# Patient Record
Sex: Female | Born: 1950 | Race: White | Hispanic: No | Smoking: Former smoker
Health system: Southern US, Community
[De-identification: ages and names within clinical notes are randomized; demographics above are authoritative.]

## PROBLEM LIST (undated history)

## (undated) DIAGNOSIS — G459 Transient cerebral ischemic attack, unspecified: Secondary | ICD-10-CM

## (undated) DIAGNOSIS — G4700 Insomnia, unspecified: Secondary | ICD-10-CM

## (undated) DIAGNOSIS — F32A Depression, unspecified: Secondary | ICD-10-CM

## (undated) DIAGNOSIS — K279 Peptic ulcer, site unspecified, unspecified as acute or chronic, without hemorrhage or perforation: Secondary | ICD-10-CM

## (undated) DIAGNOSIS — I10 Essential (primary) hypertension: Secondary | ICD-10-CM

## (undated) DIAGNOSIS — J9622 Acute and chronic respiratory failure with hypercapnia: Secondary | ICD-10-CM

## (undated) DIAGNOSIS — J449 Chronic obstructive pulmonary disease, unspecified: Secondary | ICD-10-CM

## (undated) DIAGNOSIS — I5031 Acute diastolic (congestive) heart failure: Secondary | ICD-10-CM

## (undated) DIAGNOSIS — K5790 Diverticulosis of intestine, part unspecified, without perforation or abscess without bleeding: Secondary | ICD-10-CM

## (undated) DIAGNOSIS — Z90710 Acquired absence of both cervix and uterus: Secondary | ICD-10-CM

## (undated) DIAGNOSIS — Z720 Tobacco use: Secondary | ICD-10-CM

## (undated) DIAGNOSIS — I2510 Atherosclerotic heart disease of native coronary artery without angina pectoris: Secondary | ICD-10-CM

## (undated) DIAGNOSIS — J329 Chronic sinusitis, unspecified: Secondary | ICD-10-CM

## (undated) DIAGNOSIS — R42 Dizziness and giddiness: Secondary | ICD-10-CM

## (undated) DIAGNOSIS — I889 Nonspecific lymphadenitis, unspecified: Secondary | ICD-10-CM

## (undated) DIAGNOSIS — IMO0002 Reserved for concepts with insufficient information to code with codable children: Secondary | ICD-10-CM

## (undated) DIAGNOSIS — M549 Dorsalgia, unspecified: Secondary | ICD-10-CM

## (undated) DIAGNOSIS — K589 Irritable bowel syndrome without diarrhea: Secondary | ICD-10-CM

## (undated) DIAGNOSIS — I739 Peripheral vascular disease, unspecified: Secondary | ICD-10-CM

## (undated) DIAGNOSIS — R9389 Abnormal findings on diagnostic imaging of other specified body structures: Secondary | ICD-10-CM

## (undated) DIAGNOSIS — F329 Major depressive disorder, single episode, unspecified: Secondary | ICD-10-CM

## (undated) DIAGNOSIS — R230 Cyanosis: Secondary | ICD-10-CM

## (undated) DIAGNOSIS — R197 Diarrhea, unspecified: Secondary | ICD-10-CM

## (undated) DIAGNOSIS — K219 Gastro-esophageal reflux disease without esophagitis: Secondary | ICD-10-CM

## (undated) DIAGNOSIS — F1010 Alcohol abuse, uncomplicated: Secondary | ICD-10-CM

## (undated) DIAGNOSIS — J029 Acute pharyngitis, unspecified: Secondary | ICD-10-CM

## (undated) HISTORY — DX: Depression, unspecified: F32.A

## (undated) HISTORY — DX: Acute and chronic respiratory failure with hypercapnia: J96.22

## (undated) HISTORY — DX: Abnormal findings on diagnostic imaging of other specified body structures: R93.89

## (undated) HISTORY — DX: Tobacco use: Z72.0

## (undated) HISTORY — DX: Acute pharyngitis, unspecified: J02.9

## (undated) HISTORY — DX: Dorsalgia, unspecified: M54.9

## (undated) HISTORY — DX: Chronic obstructive pulmonary disease, unspecified: J44.9

## (undated) HISTORY — DX: Irritable bowel syndrome, unspecified: K58.9

## (undated) HISTORY — DX: Reserved for concepts with insufficient information to code with codable children: IMO0002

## (undated) HISTORY — DX: Dizziness and giddiness: R42

## (undated) HISTORY — DX: Gastro-esophageal reflux disease without esophagitis: K21.9

## (undated) HISTORY — PX: CARDIAC CATHETERIZATION: SHX172

## (undated) HISTORY — DX: Alcohol abuse, uncomplicated: F10.10

## (undated) HISTORY — PX: TUBAL LIGATION: SHX77

## (undated) HISTORY — DX: Diverticulosis of intestine, part unspecified, without perforation or abscess without bleeding: K57.90

## (undated) HISTORY — DX: Peripheral vascular disease, unspecified: I73.9

## (undated) HISTORY — DX: Major depressive disorder, single episode, unspecified: F32.9

## (undated) HISTORY — PX: CORONARY STENT PLACEMENT: SHX1402

## (undated) HISTORY — PX: ABDOMINAL HYSTERECTOMY: SHX81

## (undated) HISTORY — PX: OTHER SURGICAL HISTORY: SHX169

## (undated) HISTORY — DX: Acquired absence of both cervix and uterus: Z90.710

## (undated) HISTORY — DX: Insomnia, unspecified: G47.00

## (undated) HISTORY — DX: Chronic sinusitis, unspecified: J32.9

## (undated) HISTORY — DX: Cyanosis: R23.0

## (undated) HISTORY — DX: Essential (primary) hypertension: I10

## (undated) HISTORY — PX: CHOLECYSTECTOMY: SHX55

## (undated) HISTORY — PX: APPENDECTOMY: SHX54

## (undated) HISTORY — DX: Acute diastolic (congestive) heart failure: I50.31

## (undated) HISTORY — DX: Nonspecific lymphadenitis, unspecified: I88.9

## (undated) HISTORY — DX: Atherosclerotic heart disease of native coronary artery without angina pectoris: I25.10

## (undated) HISTORY — DX: Diarrhea, unspecified: R19.7

## (undated) HISTORY — DX: Transient cerebral ischemic attack, unspecified: G45.9

## (undated) HISTORY — DX: Peptic ulcer, site unspecified, unspecified as acute or chronic, without hemorrhage or perforation: K27.9

---

## 2003-09-27 ENCOUNTER — Inpatient Hospital Stay (HOSPITAL_COMMUNITY): Admission: EM | Admit: 2003-09-27 | Discharge: 2003-10-02 | Payer: Self-pay

## 2003-09-30 ENCOUNTER — Encounter (INDEPENDENT_AMBULATORY_CARE_PROVIDER_SITE_OTHER): Payer: Self-pay | Admitting: Cardiology

## 2003-10-01 ENCOUNTER — Encounter (INDEPENDENT_AMBULATORY_CARE_PROVIDER_SITE_OTHER): Payer: Self-pay | Admitting: Cardiology

## 2003-10-09 ENCOUNTER — Inpatient Hospital Stay (HOSPITAL_COMMUNITY): Admission: EM | Admit: 2003-10-09 | Discharge: 2003-10-10 | Payer: Self-pay | Admitting: Emergency Medicine

## 2003-10-15 ENCOUNTER — Emergency Department (HOSPITAL_COMMUNITY): Admission: EM | Admit: 2003-10-15 | Discharge: 2003-10-16 | Payer: Self-pay | Admitting: Emergency Medicine

## 2003-12-22 ENCOUNTER — Ambulatory Visit: Payer: Self-pay | Admitting: Internal Medicine

## 2003-12-31 ENCOUNTER — Ambulatory Visit: Payer: Self-pay | Admitting: *Deleted

## 2004-01-01 ENCOUNTER — Ambulatory Visit (HOSPITAL_COMMUNITY): Admission: RE | Admit: 2004-01-01 | Discharge: 2004-01-01 | Payer: Self-pay | Admitting: Internal Medicine

## 2004-01-01 ENCOUNTER — Ambulatory Visit: Payer: Self-pay | Admitting: Internal Medicine

## 2004-01-12 ENCOUNTER — Ambulatory Visit (HOSPITAL_COMMUNITY): Admission: RE | Admit: 2004-01-12 | Discharge: 2004-01-12 | Payer: Self-pay | Admitting: Internal Medicine

## 2004-01-14 ENCOUNTER — Ambulatory Visit: Payer: Self-pay | Admitting: Internal Medicine

## 2004-03-11 ENCOUNTER — Emergency Department (HOSPITAL_COMMUNITY): Admission: EM | Admit: 2004-03-11 | Discharge: 2004-03-11 | Payer: Self-pay | Admitting: Emergency Medicine

## 2004-03-17 ENCOUNTER — Ambulatory Visit: Payer: Self-pay | Admitting: Internal Medicine

## 2004-03-22 ENCOUNTER — Ambulatory Visit: Payer: Self-pay | Admitting: Internal Medicine

## 2004-03-23 ENCOUNTER — Ambulatory Visit (HOSPITAL_COMMUNITY): Admission: RE | Admit: 2004-03-23 | Discharge: 2004-03-23 | Payer: Self-pay | Admitting: Internal Medicine

## 2004-03-25 ENCOUNTER — Ambulatory Visit: Payer: Self-pay | Admitting: Internal Medicine

## 2004-03-31 ENCOUNTER — Inpatient Hospital Stay (HOSPITAL_COMMUNITY): Admission: EM | Admit: 2004-03-31 | Discharge: 2004-04-02 | Payer: Self-pay | Admitting: Emergency Medicine

## 2004-04-08 ENCOUNTER — Ambulatory Visit: Payer: Self-pay | Admitting: Internal Medicine

## 2004-04-10 ENCOUNTER — Ambulatory Visit (HOSPITAL_COMMUNITY): Admission: RE | Admit: 2004-04-10 | Discharge: 2004-04-10 | Payer: Self-pay | Admitting: Internal Medicine

## 2004-04-16 ENCOUNTER — Ambulatory Visit: Payer: Self-pay | Admitting: Internal Medicine

## 2004-05-14 ENCOUNTER — Emergency Department (HOSPITAL_COMMUNITY): Admission: EM | Admit: 2004-05-14 | Discharge: 2004-05-14 | Payer: Self-pay | Admitting: Emergency Medicine

## 2004-06-29 ENCOUNTER — Ambulatory Visit: Payer: Self-pay | Admitting: Internal Medicine

## 2004-08-11 ENCOUNTER — Ambulatory Visit: Payer: Self-pay | Admitting: Internal Medicine

## 2004-10-16 ENCOUNTER — Other Ambulatory Visit: Payer: Self-pay

## 2004-10-17 ENCOUNTER — Other Ambulatory Visit: Payer: Self-pay

## 2004-10-17 ENCOUNTER — Inpatient Hospital Stay: Payer: Self-pay | Admitting: Internal Medicine

## 2004-12-24 ENCOUNTER — Ambulatory Visit: Payer: Self-pay | Admitting: Internal Medicine

## 2005-02-08 ENCOUNTER — Emergency Department: Payer: Self-pay | Admitting: Unknown Physician Specialty

## 2005-04-29 ENCOUNTER — Ambulatory Visit: Payer: Self-pay | Admitting: Internal Medicine

## 2005-05-02 ENCOUNTER — Ambulatory Visit (HOSPITAL_COMMUNITY): Admission: RE | Admit: 2005-05-02 | Discharge: 2005-05-02 | Payer: Self-pay | Admitting: Internal Medicine

## 2005-05-12 ENCOUNTER — Ambulatory Visit: Payer: Self-pay | Admitting: Internal Medicine

## 2005-05-23 ENCOUNTER — Encounter (INDEPENDENT_AMBULATORY_CARE_PROVIDER_SITE_OTHER): Payer: Self-pay | Admitting: Internal Medicine

## 2005-05-23 ENCOUNTER — Ambulatory Visit: Payer: Self-pay | Admitting: Internal Medicine

## 2005-05-23 LAB — CONVERTED CEMR LAB
Cholesterol: 145 mg/dL
HDL: 32 mg/dL
Triglycerides: 206 mg/dL

## 2005-05-30 ENCOUNTER — Ambulatory Visit: Payer: Self-pay | Admitting: Internal Medicine

## 2005-06-07 ENCOUNTER — Encounter: Admission: RE | Admit: 2005-06-07 | Discharge: 2005-06-07 | Payer: Self-pay | Admitting: Internal Medicine

## 2005-06-23 ENCOUNTER — Encounter: Admission: RE | Admit: 2005-06-23 | Discharge: 2005-06-23 | Payer: Self-pay | Admitting: Internal Medicine

## 2005-09-22 ENCOUNTER — Ambulatory Visit (HOSPITAL_COMMUNITY): Admission: RE | Admit: 2005-09-22 | Discharge: 2005-09-22 | Payer: Self-pay | Admitting: Cardiology

## 2005-10-06 ENCOUNTER — Ambulatory Visit: Payer: Self-pay | Admitting: Family Medicine

## 2005-10-06 ENCOUNTER — Encounter (INDEPENDENT_AMBULATORY_CARE_PROVIDER_SITE_OTHER): Payer: Self-pay | Admitting: Internal Medicine

## 2005-10-06 LAB — CONVERTED CEMR LAB: Hemoglobin: 13.3 g/dL

## 2005-12-12 ENCOUNTER — Ambulatory Visit: Payer: Self-pay | Admitting: Internal Medicine

## 2005-12-21 ENCOUNTER — Ambulatory Visit (HOSPITAL_COMMUNITY): Admission: RE | Admit: 2005-12-21 | Discharge: 2005-12-21 | Payer: Self-pay | Admitting: Gastroenterology

## 2005-12-28 ENCOUNTER — Ambulatory Visit: Payer: Self-pay | Admitting: Family Medicine

## 2006-02-14 ENCOUNTER — Ambulatory Visit: Payer: Self-pay | Admitting: Internal Medicine

## 2006-02-15 ENCOUNTER — Ambulatory Visit (HOSPITAL_COMMUNITY): Admission: RE | Admit: 2006-02-15 | Discharge: 2006-02-15 | Payer: Self-pay | Admitting: Internal Medicine

## 2006-02-18 DIAGNOSIS — R197 Diarrhea, unspecified: Secondary | ICD-10-CM | POA: Insufficient documentation

## 2006-02-27 ENCOUNTER — Encounter: Admission: RE | Admit: 2006-02-27 | Discharge: 2006-05-28 | Payer: Self-pay | Admitting: Internal Medicine

## 2006-02-28 ENCOUNTER — Ambulatory Visit: Payer: Self-pay | Admitting: Internal Medicine

## 2006-04-18 ENCOUNTER — Ambulatory Visit: Payer: Self-pay | Admitting: Internal Medicine

## 2006-05-26 ENCOUNTER — Ambulatory Visit: Payer: Self-pay | Admitting: Internal Medicine

## 2006-05-30 ENCOUNTER — Ambulatory Visit: Payer: Self-pay | Admitting: Internal Medicine

## 2006-06-27 ENCOUNTER — Inpatient Hospital Stay (HOSPITAL_COMMUNITY): Admission: AD | Admit: 2006-06-27 | Discharge: 2006-06-30 | Payer: Self-pay | Admitting: Surgery

## 2006-06-29 ENCOUNTER — Encounter (INDEPENDENT_AMBULATORY_CARE_PROVIDER_SITE_OTHER): Payer: Self-pay | Admitting: Specialist

## 2006-08-24 ENCOUNTER — Ambulatory Visit: Payer: Self-pay | Admitting: Internal Medicine

## 2006-08-24 LAB — CONVERTED CEMR LAB: Creatinine, Ser: 0.61 mg/dL

## 2006-08-28 ENCOUNTER — Ambulatory Visit (HOSPITAL_COMMUNITY): Admission: RE | Admit: 2006-08-28 | Discharge: 2006-08-28 | Payer: Self-pay | Admitting: Internal Medicine

## 2006-09-01 ENCOUNTER — Ambulatory Visit: Payer: Self-pay | Admitting: Internal Medicine

## 2006-09-01 DIAGNOSIS — B351 Tinea unguium: Secondary | ICD-10-CM | POA: Insufficient documentation

## 2006-09-04 ENCOUNTER — Encounter (INDEPENDENT_AMBULATORY_CARE_PROVIDER_SITE_OTHER): Payer: Self-pay | Admitting: Internal Medicine

## 2006-09-04 DIAGNOSIS — R51 Headache: Secondary | ICD-10-CM

## 2006-09-04 DIAGNOSIS — Z9861 Coronary angioplasty status: Secondary | ICD-10-CM

## 2006-09-04 DIAGNOSIS — J449 Chronic obstructive pulmonary disease, unspecified: Secondary | ICD-10-CM

## 2006-09-04 DIAGNOSIS — K279 Peptic ulcer, site unspecified, unspecified as acute or chronic, without hemorrhage or perforation: Secondary | ICD-10-CM

## 2006-09-04 DIAGNOSIS — K589 Irritable bowel syndrome without diarrhea: Secondary | ICD-10-CM | POA: Insufficient documentation

## 2006-09-04 DIAGNOSIS — K649 Unspecified hemorrhoids: Secondary | ICD-10-CM | POA: Insufficient documentation

## 2006-09-04 DIAGNOSIS — I2510 Atherosclerotic heart disease of native coronary artery without angina pectoris: Secondary | ICD-10-CM | POA: Insufficient documentation

## 2006-09-04 DIAGNOSIS — E785 Hyperlipidemia, unspecified: Secondary | ICD-10-CM | POA: Insufficient documentation

## 2006-09-04 DIAGNOSIS — M545 Low back pain, unspecified: Secondary | ICD-10-CM | POA: Insufficient documentation

## 2006-09-04 DIAGNOSIS — F329 Major depressive disorder, single episode, unspecified: Secondary | ICD-10-CM | POA: Insufficient documentation

## 2006-09-04 DIAGNOSIS — K219 Gastro-esophageal reflux disease without esophagitis: Secondary | ICD-10-CM

## 2006-09-04 DIAGNOSIS — I10 Essential (primary) hypertension: Secondary | ICD-10-CM

## 2006-09-04 DIAGNOSIS — F17210 Nicotine dependence, cigarettes, uncomplicated: Secondary | ICD-10-CM

## 2006-09-04 DIAGNOSIS — F1010 Alcohol abuse, uncomplicated: Secondary | ICD-10-CM | POA: Insufficient documentation

## 2006-09-04 DIAGNOSIS — M5030 Other cervical disc degeneration, unspecified cervical region: Secondary | ICD-10-CM

## 2006-09-04 DIAGNOSIS — R519 Headache, unspecified: Secondary | ICD-10-CM | POA: Insufficient documentation

## 2006-09-04 DIAGNOSIS — Z8679 Personal history of other diseases of the circulatory system: Secondary | ICD-10-CM | POA: Insufficient documentation

## 2006-09-04 DIAGNOSIS — K5730 Diverticulosis of large intestine without perforation or abscess without bleeding: Secondary | ICD-10-CM | POA: Insufficient documentation

## 2006-09-04 HISTORY — DX: Alcohol abuse, uncomplicated: F10.10

## 2006-09-04 HISTORY — DX: Peptic ulcer, site unspecified, unspecified as acute or chronic, without hemorrhage or perforation: K27.9

## 2006-09-04 HISTORY — DX: Diverticulosis of large intestine without perforation or abscess without bleeding: K57.30

## 2006-09-04 HISTORY — DX: Coronary angioplasty status: Z98.61

## 2006-09-04 HISTORY — DX: Other cervical disc degeneration, unspecified cervical region: M50.30

## 2006-09-04 HISTORY — DX: Atherosclerotic heart disease of native coronary artery without angina pectoris: I25.10

## 2006-09-04 HISTORY — DX: Irritable bowel syndrome, unspecified: K58.9

## 2006-09-04 HISTORY — DX: Chronic obstructive pulmonary disease, unspecified: J44.9

## 2006-10-19 ENCOUNTER — Ambulatory Visit: Payer: Self-pay | Admitting: Internal Medicine

## 2006-10-24 ENCOUNTER — Ambulatory Visit: Payer: Self-pay | Admitting: Internal Medicine

## 2006-10-25 ENCOUNTER — Ambulatory Visit (HOSPITAL_COMMUNITY): Admission: RE | Admit: 2006-10-25 | Discharge: 2006-10-25 | Payer: Self-pay | Admitting: Internal Medicine

## 2006-11-07 ENCOUNTER — Ambulatory Visit: Payer: Self-pay | Admitting: Internal Medicine

## 2006-11-16 ENCOUNTER — Ambulatory Visit: Payer: Self-pay | Admitting: Surgery

## 2006-11-16 ENCOUNTER — Ambulatory Visit (HOSPITAL_COMMUNITY): Admission: RE | Admit: 2006-11-16 | Discharge: 2006-11-16 | Payer: Self-pay | Admitting: Internal Medicine

## 2006-11-16 ENCOUNTER — Encounter (INDEPENDENT_AMBULATORY_CARE_PROVIDER_SITE_OTHER): Payer: Self-pay | Admitting: Internal Medicine

## 2006-12-01 ENCOUNTER — Telehealth (INDEPENDENT_AMBULATORY_CARE_PROVIDER_SITE_OTHER): Payer: Self-pay | Admitting: Internal Medicine

## 2006-12-01 ENCOUNTER — Encounter (INDEPENDENT_AMBULATORY_CARE_PROVIDER_SITE_OTHER): Payer: Self-pay | Admitting: Internal Medicine

## 2006-12-06 ENCOUNTER — Encounter (INDEPENDENT_AMBULATORY_CARE_PROVIDER_SITE_OTHER): Payer: Self-pay | Admitting: *Deleted

## 2006-12-29 ENCOUNTER — Telehealth (INDEPENDENT_AMBULATORY_CARE_PROVIDER_SITE_OTHER): Payer: Self-pay | Admitting: Internal Medicine

## 2007-01-02 ENCOUNTER — Telehealth (INDEPENDENT_AMBULATORY_CARE_PROVIDER_SITE_OTHER): Payer: Self-pay | Admitting: *Deleted

## 2007-01-08 ENCOUNTER — Ambulatory Visit: Payer: Self-pay | Admitting: Internal Medicine

## 2007-01-30 ENCOUNTER — Ambulatory Visit: Payer: Self-pay | Admitting: Internal Medicine

## 2007-01-30 DIAGNOSIS — G4700 Insomnia, unspecified: Secondary | ICD-10-CM

## 2007-02-28 ENCOUNTER — Encounter (INDEPENDENT_AMBULATORY_CARE_PROVIDER_SITE_OTHER): Payer: Self-pay | Admitting: Internal Medicine

## 2007-03-01 ENCOUNTER — Ambulatory Visit: Payer: Self-pay | Admitting: Internal Medicine

## 2007-03-01 DIAGNOSIS — A630 Anogenital (venereal) warts: Secondary | ICD-10-CM

## 2007-03-01 DIAGNOSIS — J019 Acute sinusitis, unspecified: Secondary | ICD-10-CM

## 2007-03-06 ENCOUNTER — Ambulatory Visit: Payer: Self-pay | Admitting: Internal Medicine

## 2007-03-29 ENCOUNTER — Ambulatory Visit: Payer: Self-pay | Admitting: Internal Medicine

## 2007-03-29 DIAGNOSIS — I209 Angina pectoris, unspecified: Secondary | ICD-10-CM | POA: Insufficient documentation

## 2007-03-29 HISTORY — DX: Angina pectoris, unspecified: I20.9

## 2007-03-29 LAB — CONVERTED CEMR LAB

## 2007-04-18 ENCOUNTER — Ambulatory Visit: Payer: Self-pay | Admitting: Cardiovascular Disease

## 2007-04-18 LAB — CONVERTED CEMR LAB
Basophils Relative: 0.3 % (ref 0.0–1.0)
CO2: 29 meq/L (ref 19–32)
Calcium: 9.3 mg/dL (ref 8.4–10.5)
Eosinophils Relative: 2.2 % (ref 0.0–5.0)
GFR calc Af Amer: 111 mL/min
Glucose, Bld: 156 mg/dL — ABNORMAL HIGH (ref 70–99)
HCT: 40.5 % (ref 36.0–46.0)
Hemoglobin: 13.4 g/dL (ref 12.0–15.0)
Lymphocytes Relative: 31.5 % (ref 12.0–46.0)
Neutro Abs: 4.8 10*3/uL (ref 1.4–7.7)
Platelets: 256 10*3/uL (ref 150–400)
Prothrombin Time: 11.7 s (ref 10.9–13.3)
WBC: 8.2 10*3/uL (ref 4.5–10.5)

## 2007-04-25 ENCOUNTER — Ambulatory Visit: Payer: Self-pay | Admitting: Cardiology

## 2007-04-25 ENCOUNTER — Encounter (INDEPENDENT_AMBULATORY_CARE_PROVIDER_SITE_OTHER): Payer: Self-pay | Admitting: Internal Medicine

## 2007-04-25 ENCOUNTER — Ambulatory Visit (HOSPITAL_COMMUNITY): Admission: RE | Admit: 2007-04-25 | Discharge: 2007-04-25 | Payer: Self-pay | Admitting: Cardiology

## 2007-04-27 ENCOUNTER — Ambulatory Visit: Payer: Self-pay

## 2007-05-03 ENCOUNTER — Ambulatory Visit: Payer: Self-pay | Admitting: Cardiovascular Disease

## 2007-05-03 LAB — CONVERTED CEMR LAB
CO2: 31 meq/L (ref 19–32)
Calcium: 9.1 mg/dL (ref 8.4–10.5)
Chloride: 102 meq/L (ref 96–112)
Glucose, Bld: 142 mg/dL — ABNORMAL HIGH (ref 70–99)

## 2007-05-04 ENCOUNTER — Telehealth (INDEPENDENT_AMBULATORY_CARE_PROVIDER_SITE_OTHER): Payer: Self-pay | Admitting: Internal Medicine

## 2007-05-10 ENCOUNTER — Ambulatory Visit: Payer: Self-pay | Admitting: Cardiology

## 2007-05-13 ENCOUNTER — Encounter (INDEPENDENT_AMBULATORY_CARE_PROVIDER_SITE_OTHER): Payer: Self-pay | Admitting: Internal Medicine

## 2007-07-16 ENCOUNTER — Telehealth (INDEPENDENT_AMBULATORY_CARE_PROVIDER_SITE_OTHER): Payer: Self-pay | Admitting: Internal Medicine

## 2007-07-18 ENCOUNTER — Ambulatory Visit: Payer: Self-pay | Admitting: Family Medicine

## 2007-07-18 DIAGNOSIS — L049 Acute lymphadenitis, unspecified: Secondary | ICD-10-CM | POA: Insufficient documentation

## 2007-07-18 DIAGNOSIS — J029 Acute pharyngitis, unspecified: Secondary | ICD-10-CM | POA: Insufficient documentation

## 2007-07-18 HISTORY — DX: Acute pharyngitis, unspecified: J02.9

## 2007-08-14 ENCOUNTER — Telehealth (INDEPENDENT_AMBULATORY_CARE_PROVIDER_SITE_OTHER): Payer: Self-pay | Admitting: Internal Medicine

## 2007-09-03 ENCOUNTER — Telehealth (INDEPENDENT_AMBULATORY_CARE_PROVIDER_SITE_OTHER): Payer: Self-pay | Admitting: Internal Medicine

## 2007-10-31 ENCOUNTER — Telehealth (INDEPENDENT_AMBULATORY_CARE_PROVIDER_SITE_OTHER): Payer: Self-pay | Admitting: Internal Medicine

## 2007-11-07 ENCOUNTER — Telehealth (INDEPENDENT_AMBULATORY_CARE_PROVIDER_SITE_OTHER): Payer: Self-pay | Admitting: Internal Medicine

## 2007-11-08 ENCOUNTER — Ambulatory Visit: Payer: Self-pay | Admitting: Internal Medicine

## 2007-11-08 LAB — CONVERTED CEMR LAB
ALT: 20 units/L (ref 0–35)
Alkaline Phosphatase: 94 units/L (ref 39–117)
Indirect Bilirubin: 0.3 mg/dL (ref 0.0–0.9)
Total Protein: 7.7 g/dL (ref 6.0–8.3)

## 2007-11-09 ENCOUNTER — Telehealth (INDEPENDENT_AMBULATORY_CARE_PROVIDER_SITE_OTHER): Payer: Self-pay | Admitting: Internal Medicine

## 2007-11-10 ENCOUNTER — Encounter (INDEPENDENT_AMBULATORY_CARE_PROVIDER_SITE_OTHER): Payer: Self-pay | Admitting: Internal Medicine

## 2008-01-01 ENCOUNTER — Telehealth (INDEPENDENT_AMBULATORY_CARE_PROVIDER_SITE_OTHER): Payer: Self-pay | Admitting: Internal Medicine

## 2008-02-04 ENCOUNTER — Ambulatory Visit: Payer: Self-pay | Admitting: Internal Medicine

## 2008-02-04 DIAGNOSIS — R42 Dizziness and giddiness: Secondary | ICD-10-CM | POA: Insufficient documentation

## 2008-02-05 ENCOUNTER — Ambulatory Visit (HOSPITAL_COMMUNITY): Admission: RE | Admit: 2008-02-05 | Discharge: 2008-02-05 | Payer: Self-pay | Admitting: Internal Medicine

## 2008-04-08 ENCOUNTER — Telehealth (INDEPENDENT_AMBULATORY_CARE_PROVIDER_SITE_OTHER): Payer: Self-pay | Admitting: Internal Medicine

## 2008-04-15 ENCOUNTER — Ambulatory Visit: Payer: Self-pay | Admitting: Internal Medicine

## 2008-04-15 DIAGNOSIS — H9319 Tinnitus, unspecified ear: Secondary | ICD-10-CM | POA: Insufficient documentation

## 2008-04-15 DIAGNOSIS — R059 Cough, unspecified: Secondary | ICD-10-CM | POA: Insufficient documentation

## 2008-04-15 DIAGNOSIS — R05 Cough: Secondary | ICD-10-CM

## 2008-04-16 ENCOUNTER — Encounter (INDEPENDENT_AMBULATORY_CARE_PROVIDER_SITE_OTHER): Payer: Self-pay | Admitting: Internal Medicine

## 2008-04-17 ENCOUNTER — Ambulatory Visit (HOSPITAL_COMMUNITY): Admission: RE | Admit: 2008-04-17 | Discharge: 2008-04-17 | Payer: Self-pay | Admitting: Internal Medicine

## 2008-05-05 ENCOUNTER — Telehealth (INDEPENDENT_AMBULATORY_CARE_PROVIDER_SITE_OTHER): Payer: Self-pay | Admitting: Internal Medicine

## 2008-05-19 ENCOUNTER — Telehealth (INDEPENDENT_AMBULATORY_CARE_PROVIDER_SITE_OTHER): Payer: Self-pay | Admitting: Internal Medicine

## 2008-06-02 ENCOUNTER — Encounter (INDEPENDENT_AMBULATORY_CARE_PROVIDER_SITE_OTHER): Payer: Self-pay | Admitting: Internal Medicine

## 2008-06-06 ENCOUNTER — Encounter (INDEPENDENT_AMBULATORY_CARE_PROVIDER_SITE_OTHER): Payer: Self-pay | Admitting: Internal Medicine

## 2009-08-29 ENCOUNTER — Ambulatory Visit: Payer: Self-pay | Admitting: Cardiology

## 2009-08-29 ENCOUNTER — Inpatient Hospital Stay (HOSPITAL_COMMUNITY): Admission: EM | Admit: 2009-08-29 | Discharge: 2009-08-31 | Payer: Self-pay | Admitting: Emergency Medicine

## 2009-09-02 ENCOUNTER — Telehealth (INDEPENDENT_AMBULATORY_CARE_PROVIDER_SITE_OTHER): Payer: Self-pay | Admitting: *Deleted

## 2009-09-02 DIAGNOSIS — R930 Abnormal findings on diagnostic imaging of skull and head, not elsewhere classified: Secondary | ICD-10-CM

## 2009-09-14 ENCOUNTER — Encounter: Payer: Self-pay | Admitting: Cardiovascular Disease

## 2009-09-30 DIAGNOSIS — I739 Peripheral vascular disease, unspecified: Secondary | ICD-10-CM

## 2009-10-01 ENCOUNTER — Ambulatory Visit: Payer: Self-pay | Admitting: Internal Medicine

## 2009-10-01 ENCOUNTER — Ambulatory Visit: Payer: Self-pay | Admitting: Cardiovascular Disease

## 2009-10-27 ENCOUNTER — Encounter: Payer: Self-pay | Admitting: Cardiovascular Disease

## 2009-12-04 ENCOUNTER — Ambulatory Visit: Payer: Self-pay | Admitting: Cardiovascular Disease

## 2009-12-04 ENCOUNTER — Emergency Department (HOSPITAL_COMMUNITY): Admission: EM | Admit: 2009-12-04 | Discharge: 2009-12-04 | Payer: Self-pay | Admitting: Emergency Medicine

## 2009-12-18 ENCOUNTER — Telehealth (INDEPENDENT_AMBULATORY_CARE_PROVIDER_SITE_OTHER): Payer: Self-pay | Admitting: *Deleted

## 2010-02-02 ENCOUNTER — Encounter
Admission: RE | Admit: 2010-02-02 | Discharge: 2010-03-16 | Payer: Self-pay | Source: Home / Self Care | Attending: Neurosurgery | Admitting: Neurosurgery

## 2010-02-25 ENCOUNTER — Observation Stay (HOSPITAL_COMMUNITY): Admission: EM | Admit: 2010-02-25 | Discharge: 2009-10-11 | Payer: Self-pay | Admitting: Emergency Medicine

## 2010-04-20 NOTE — Progress Notes (Signed)
   DDS request recieved sent to [facility]  December 18, 2009 9:18 AM

## 2010-04-20 NOTE — Progress Notes (Signed)
   Phone Note Outgoing Call   Call placed by: Deliah Goody, RN,  September 02, 2009 10:23 AM Summary of Call: spoke with pt, she was recently in the hosp and had an abnormal chest xray. ct scan of the chest ordered without contrast to follow up. she is scheduled 10-01-09 @ 2pm prior to her post hosp follow up with dr Eden Emms Deliah Goody, RN  September 02, 2009 10:25 AM   New Problems: ABNORMAL CHEST XRAY (ICD-793.1)   New Problems: ABNORMAL CHEST XRAY (ICD-793.1)

## 2010-04-20 NOTE — Letter (Signed)
Summary: [facility]  [facility]   Imported By: Marylou Mccoy 11/12/2009 15:43:48  _____________________________________________________________________  External Attachment:    Type:   Image     Comment:   External Document

## 2010-04-20 NOTE — Consult Note (Signed)
[facility]: [facility]   Imported By: Roderic Ovens 09/11/2009 16:17:38  _____________________________________________________________________  External Attachment:    Type:   Image     Comment:   External Document

## 2010-04-20 NOTE — Assessment & Plan Note (Signed)
Summary: eph/jss  Medications Added NIASPAN 1000 MG CR-TABS (NIACIN (ANTIHYPERLIPIDEMIC)) 1  tab by mouth once daily METOPROLOL SUCCINATE 25 MG XR24H-TAB (METOPROLOL SUCCINATE) Take one tablet by mouth daily TRAMADOL HCL 50 MG TABS (TRAMADOL HCL) as needed VENTOLIN HFA 108 (90 BASE) MCG/ACT AERS (ALBUTEROL SULFATE) as directed SIMVASTATIN 20 MG TABS (SIMVASTATIN) Take one tablet by mouth daily at bedtime SEREVENT DISKUS 50 MCG/DOSE AEPB (SALMETEROL XINAFOATE) as directed QVAR 80 MCG/ACT AERS (BECLOMETHASONE DIPROPIONATE) as directed CALCIUM 600 600 MG TABS (CALCIUM CARBONATE) 1 tab by mouth once daily VITAMIN D (ERGOCALCIFEROL) 50000 UNIT CAPS (ERGOCALCIFEROL) weekly      Allergies Added: NKDA  History of Present Illness: F/U for CAD.  Recent hospitalization carede for by Dr Antoine Poche.  I have not seen since 2009  Previous patient of [facility].  Previoius stent to OM and LAD.    Status post Cypher stent to the LAD in 2005. Status post  Cypher stent to the distal   circumflex and OM2 branch in 2005 with balloon angioplasty for restenosis in the circumflex in 2006.  Last cath by Dr Juanda Chance 6/11 with patent stents and no critical stenosis.  Abnormal CXR with "flame" like lesion in left lung.  Had CT just prior to exam to assess and R/O bronchogenic CA.  No angina since D/C.  Cath done from right wrist with no complications.  Denies hemoptysis, cough, sputum.  Gets dizzy with change in position.  Not postural and no history of vertebrobasalar insuff.  Compliant with meds.  Counseled on smoking cessation.  Failed Chantix in past.  Will consider Welbutrin.  Would be ok to use nicotine replacement with cors looking ok on cath  Reviewed hospital notes and D/C summary Reviewed Cath Reviewed Lung CT  Current Problems (verified): 1)  Chest Pain  (ICD-786.50) 2)  Hypertension  (ICD-401.9) 3)  Transient Ischemic Attack, Hx of  (ICD-V12.50) 4)  Hyperlipidemia  (ICD-272.4) 5)  Coronary Artery Disease   (ICD-414.00) 6)  Tinnitus  (ICD-388.30) 7)  Pvd  (ICD-443.9) 8)  Abnormal Chest Xray  (ICD-793.1) 9)  Cough  (ICD-786.2) 10)  Vertigo  (ICD-780.4) 11)  Lymphadenitis, Cervical  (ICD-683) 12)  Pharyngitis  (ICD-462) 13)  Condyloma Acuminatum  (ICD-078.11) 14)  Sinusitis, Acute  (ICD-461.9) 15)  Insomnia  (ICD-780.52) 16)  Symptom, Cyanosis of Feet  (ICD-782.5) 17)  Onychomycosis, Toenails  (ICD-110.1) 18)  Tobacco Abuse  (ICD-305.1) 19)  Irritable Bowel Syndrome  (ICD-564.1) 20)  Diarrhea  (ICD-787.91) 21)  Hemorrhoids  (ICD-455.6) 22)  Degenerative Disc Disease, Cervical Spine  (ICD-722.4) 23)  Abuse, Alcohol, Unspecified  (ICD-305.00) 24)  Peptic Ulcer Disease  (ICD-533.90) 25)  Low Back Pain  (ICD-724.2) 26)  Headache  (ICD-784.0) 27)  Gerd  (ICD-530.81) 28)  Diverticulosis, Colon  (ICD-562.10) 29)  Depression  (ICD-311) 30)  COPD  (ICD-496)  Current Medications (verified): 1)  Niaspan 1000 Mg Cr-Tabs (Niacin (Antihyperlipidemic)) .[name].. 1  Tab By Mouth Once Daily 2)  Metoprolol Succinate 25 Mg Xr24h-Tab (Metoprolol Succinate) .... Take One Tablet By Mouth Daily 3)  Aspir-Low 81 Mg Tbec (Aspirin) .[name].. 1 By Mouth Once Daily 4)  Plavix 75 Mg  Tabs (Clopidogrel Bisulfate) .[name].. 1 By Mouth Once Daily 5)  Celexa 40 Mg Tabs (Citalopram Hydrobromide) .[name].. 1 By Mouth Once Daily 6)  Nitroquick 0.4 Mg  Subl (Nitroglycerin) .... As Needed Chest Pain 7)  Percocet 5-325 Mg  Tabs (Oxycodone-Acetaminophen) .[name].. 1-2 Tabs By Mouth Two Times A Day 8)  Spiriva Handihaler 18 Mcg  Caps (  Tiotropium Bromide Monohydrate) .[name].. 1 Inhalation Once Daily. 9)  Tramadol Hcl 50 Mg Tabs (Tramadol Hcl) .... As Needed 10)  Ventolin Hfa 108 (90 Base) Mcg/act Aers (Albuterol Sulfate) .... As Directed 11)  Simvastatin 20 Mg Tabs (Simvastatin) .... Take One Tablet By Mouth Daily At Bedtime 12)  Serevent Diskus 50 Mcg/dose Aepb (Salmeterol Xinafoate) .... As Directed 13)  Qvar 80 Mcg/act Aers (Beclomethasone  Dipropionate) .... As Directed 14)  Calcium 600 600 Mg Tabs (Calcium Carbonate) .[name].. 1 Tab By Mouth Once Daily 15)  Vitamin D (Ergocalciferol) 50000 Unit Caps (Ergocalciferol) .... Weekly  Allergies (verified): No Known Drug Allergies  Past History:  Past Medical History: Last updated: 09/30/2009 HYPERTENSION  CHEST PAIN TRANSIENT ISCHEMIC ATTACK, HX OF  HYPERLIPIDEMIA   CORONARY ARTERY DISEASE :  Prev seen by [facility].  Stent to OM and LAD Last cath 6/11 cathed on June 13, [name]. 2011.  The cardiac catheterization showed 30% and 50% lesions in the mid and distal LAD, 50% in the OM1, 40% in the OM2 with 20% in-stent restenosis, 40% in-stent restenosis in the circumflex and no significant disease in the RCA.  Her EF was normal.  Dr. Juanda Chance recommended a trial of proton pump inhibitors and felt that no further inpatient workup was needed.  TINNITUS  PVD  ABNORMAL CHEST XRAY  COUGH  VERTIGO  LYMPHADENITIS, CERVICAL PHARYNGITIS  CONDYLOMA ACUMINATUM  SINUSITIS, ACUTE  INSOMNIA  SYMPTOM, CYANOSIS OF FEET  ONYCHOMYCOSIS, TOENAILS  TOBACCO ABUSE  IRRITABLE BOWEL SYNDROME  DIARRHEA  HEMORRHOIDS  DEGENERATIVE DISC DISEASE, CERVICAL SPINE  ABUSE, ALCOHOL, UNSPECIFIED  PEPTIC ULCER DISEASE  LOW BACK PAIN  HEADACHE  GERD DIVERTICULOSIS, COLON  DEPRESSION  COPD   Past Surgical History: Last updated: 09/30/2009 Appendectomy  Bilateral Tubal Ligation  7/05  Hysterectomy secondary to menometrorrhagia  7/05  Stent to circumflex OM-2 and LAD for unstable angina.  Did not have a myocardial infarction.  LV         function was preserved with an EF of 65-70%.  10/07  Colonoscopy for rectal bleeding.  Found to have internal hemorrhoids.  Also diverticula of the sigmoid        colon.  03/31/04 Angioplasty of "candy wrapper" stenosis before and after the circumflex stent.  Reduction from 70        to 20% proximally and 90 to less than 20% distally.  Ejection fraction  55-60%  04/25/07:  Cardiac Cath with nonobstructive disease: at proximal LAD stent site-0%  distal to  LAD stent site--40%  Mid CFX with 15% stenosis.  No narrowings in small RCA.  Normal LV fcn.  80-90 % stenosis in right iliofemoral vessel.  Dr. Eden Emms  Family History: Last updated: 09/30/2009 Family history of coronary artery disease in both parents.   Social History: Last updated: 09/30/2009   The patient lives alone.  She works as a Electrical engineer for  [employer].  Review of Systems       Denies fever, malais, weight loss, blurry vision, decreased visual acuity, cough, sputum, SOB, hemoptysis, pleuritic pain, palpitaitons, heartburn, abdominal pain, melena, lower extremity edema, claudication, or rash.   Vital Signs:  Patient profile:   60 year old female Height:      66 inches Weight:      209 pounds BMI:     33.86 Pulse rate:   80 / minute Resp:     18 per minute BP sitting:   118 / 80  (left arm)  Vitals Entered  By: Kem Parkinson (October 01, 2009 2:28 PM)  Physical Exam  General:  Affect appropriate Healthy:  appears stated age HEENT: normal Neck supple with no adenopathy JVP normal no bruits no thyromegaly Lungs clear with no wheezing and good diaphragmatic motion Heart:  S1/S2 no murmur,rub, gallop or click PMI normal Abdomen: benighn, BS positve, no tenderness, no AAA no bruit.  No HSM or HJR Distal pulses intact with no bruits No edema Neuro non-focal Skin warm and dry    Impression & Recommendations:  Problem # 1:  CHEST PAIN (ICD-786.50) CAth with patent stents continue ASA and Plavix Her updated medication list for this problem includes:    Metoprolol Succinate 25 Mg Xr24h-tab (Metoprolol succinate) .[name]... Take one tablet by mouth daily    Aspir-low 81 Mg Tbec (Aspirin) .[name]... 1 by mouth once daily    Plavix 75 Mg Tabs (Clopidogrel bisulfate) .[name]... 1 by mouth once daily    Nitroquick 0.4 Mg Subl (Nitroglycerin) .[name]... As needed chest  pain  Problem # 2:  HYPERTENSION (ICD-401.9) Welll cotrolled Her updated medication list for this problem includes:    Metoprolol Succinate 25 Mg Xr24h-tab (Metoprolol succinate) .[name]... Take one tablet by mouth daily    Aspir-low 81 Mg Tbec (Aspirin) .[name]... 1 by mouth once daily  Problem # 3:  HYPERLIPIDEMIA (ICD-272.4) At goal with no side effects The following medications were removed from the medication list:    Lipitor 40 Mg Tabs (Atorvastatin calcium) .[name]... 1 by mouth once daily Her updated medication list for this problem includes:    Niaspan 1000 Mg Cr-tabs (Niacin (antihyperlipidemic)) .[name]... 1  tab by mouth once daily    Simvastatin 20 Mg Tabs (Simvastatin) .[name]... Take one tablet by mouth daily at bedtime  CHOL: 145 (05/23/2005)   LDL: 72 (05/23/2005)   HDL: 32 (05/23/2005)   TG: 206 (05/23/2005)  Problem # 4:  PVD (ICD-443.9) Stable with no claudication  Exercise limited by dyspnea.  Consider Pletal in future.  ABI's in a year  Problem # 5:  TOBACCO ABUSE (ICD-305.1) Counseled for less than 10 minutes.  Link to vascular disease and CA discussed at length.    Problem # 6:  ABNORMAL CHEST XRAY (ICD-793.1) REviewed CT scan.  Emphysema with scarring or subsegmental atelectasis in the lingula and RML  no malignancy.  Consdier F/U with our pulmonary department in future for COPD  Patient Instructions: 1)  Your physician recommends that you schedule a follow-up appointment in: 6 MONTHS WITH DR Eden Emms 2)  Your physician recommends that you continue on your current medications as directed. Please refer to the Current Medication list given to you today.

## 2010-06-01 ENCOUNTER — Encounter: Payer: Self-pay | Admitting: Cardiovascular Disease

## 2010-06-03 LAB — POCT CARDIAC MARKERS
CKMB, poc: <1 ng/mL — ABNORMAL LOW (ref 1.0–8.0)
CKMB, poc: <1 ng/mL — ABNORMAL LOW (ref 1.0–8.0)
Troponin i, poc: <0.05 ng/mL (ref 0.00–0.09)

## 2010-06-03 LAB — CBC
MCH: 29.8 pg (ref 26.0–34.0)
MCHC: 34.3 g/dL (ref 30.0–36.0)
Platelets: 178 10*3/uL (ref 150–400)

## 2010-06-03 LAB — COMPREHENSIVE METABOLIC PANEL
AST: 21 U/L (ref 0–37)
Albumin: 3.7 g/dL (ref 3.5–5.2)
Calcium: 8.7 mg/dL (ref 8.4–10.5)
Creatinine, Ser: 0.6 mg/dL (ref 0.4–1.2)
GFR calc Af Amer: >60 mL/min (ref >60)
GFR calc non Af Amer: >60 mL/min (ref >60)

## 2010-06-03 LAB — CK TOTAL AND CKMB (NOT AT ARMC)
CK, MB: 1 ng/mL (ref 0.3–4.0)
Total CK: 51 U/L (ref 7–177)

## 2010-06-05 LAB — POCT CARDIAC MARKERS
CKMB, poc: <1 ng/mL — ABNORMAL LOW (ref 1.0–8.0)
Myoglobin, poc: 84.7 ng/mL (ref 12–200)

## 2010-06-05 LAB — BASIC METABOLIC PANEL
CO2: 26 mEq/L (ref 19–32)
Calcium: 8.8 mg/dL (ref 8.4–10.5)
Creatinine, Ser: 0.84 mg/dL (ref 0.4–1.2)
GFR calc Af Amer: >60 mL/min (ref >60)
GFR calc non Af Amer: >60 mL/min (ref >60)
Sodium: 138 mEq/L (ref 135–145)

## 2010-06-05 LAB — URINE CULTURE
Colony Count: NO GROWTH
Culture: NO GROWTH

## 2010-06-05 LAB — DIFFERENTIAL
Lymphs Abs: 2.3 10*3/uL (ref 0.7–4.0)
Monocytes Relative: 7 % (ref 3–12)
Neutro Abs: 6.3 10*3/uL (ref 1.7–7.7)
Neutrophils Relative %: 66 % (ref 43–77)

## 2010-06-05 LAB — CBC
Hemoglobin: 12.5 g/dL (ref 12.0–15.0)
Platelets: 206 10*3/uL (ref 150–400)
RBC: 4.12 MIL/uL (ref 3.87–5.11)
WBC: 9.6 10*3/uL (ref 4.0–10.5)

## 2010-06-05 LAB — URINALYSIS, ROUTINE W REFLEX MICROSCOPIC
Hgb urine dipstick: NEGATIVE
Specific Gravity, Urine: 1.022 (ref 1.005–1.030)
Urobilinogen, UA: 1 mg/dL (ref 0.0–1.0)

## 2010-06-05 LAB — URINE MICROSCOPIC-ADD ON

## 2010-06-07 LAB — CBC
HCT: 36.1 % (ref 36.0–46.0)
HCT: 36.8 % (ref 36.0–46.0)
Hemoglobin: 11.9 g/dL — ABNORMAL LOW (ref 12.0–15.0)
Hemoglobin: 11.9 g/dL — ABNORMAL LOW (ref 12.0–15.0)
Hemoglobin: 12.6 g/dL (ref 12.0–15.0)
MCHC: 34.3 g/dL (ref 30.0–36.0)
MCHC: 34.9 g/dL (ref 30.0–36.0)
MCHC: 35 g/dL (ref 30.0–36.0)
MCV: 88.5 fL (ref 78.0–100.0)
MCV: 89.8 fL (ref 78.0–100.0)
Platelets: 169 10*3/uL (ref 150–400)
Platelets: 216 10*3/uL (ref 150–400)
RBC: 3.87 MIL/uL (ref 3.87–5.11)
RBC: 3.89 MIL/uL (ref 3.87–5.11)
RDW: 13.4 % (ref 11.5–15.5)
RDW: 13.4 % (ref 11.5–15.5)
WBC: 5.2 10*3/uL (ref 4.0–10.5)
WBC: 7.4 10*3/uL (ref 4.0–10.5)
WBC: 8.7 10*3/uL (ref 4.0–10.5)

## 2010-06-07 LAB — DIFFERENTIAL
Basophils Relative: 0 % (ref 0–1)
Eosinophils Absolute: 0.1 10*3/uL (ref 0.0–0.7)
Eosinophils Relative: 2 % (ref 0–5)
Neutrophils Relative %: 57 % (ref 43–77)

## 2010-06-07 LAB — CK TOTAL AND CKMB (NOT AT ARMC)
CK, MB: 0.8 ng/mL (ref 0.3–4.0)
Relative Index: INVALID (ref 0.0–2.5)

## 2010-06-07 LAB — HEPARIN LEVEL (UNFRACTIONATED)
Heparin Unfractionated: 0.22 IU/mL — ABNORMAL LOW (ref 0.30–0.70)
Heparin Unfractionated: 0.31 IU/mL (ref 0.30–0.70)
Heparin Unfractionated: 0.32 IU/mL (ref 0.30–0.70)
Heparin Unfractionated: 0.35 IU/mL (ref 0.30–0.70)
Heparin Unfractionated: <0.1 IU/mL — ABNORMAL LOW (ref 0.30–0.70)

## 2010-06-07 LAB — BASIC METABOLIC PANEL
BUN: 11 mg/dL (ref 6–23)
CO2: 27 mEq/L (ref 19–32)
Chloride: 105 mEq/L (ref 96–112)
Chloride: 106 mEq/L (ref 96–112)
Creatinine, Ser: 0.65 mg/dL (ref 0.4–1.2)
Creatinine, Ser: 0.68 mg/dL (ref 0.4–1.2)
GFR calc Af Amer: >60 mL/min (ref >60)
Glucose, Bld: 100 mg/dL — ABNORMAL HIGH (ref 70–99)
Potassium: 3.7 mEq/L (ref 3.5–5.1)
Sodium: 138 mEq/L (ref 135–145)

## 2010-06-07 LAB — POCT CARDIAC MARKERS: Troponin i, poc: <0.05 ng/mL (ref 0.00–0.09)

## 2010-06-07 LAB — PROTIME-INR
INR: 0.95 (ref 0.00–1.49)
Prothrombin Time: 12.6 seconds (ref 11.6–15.2)
Prothrombin Time: 12.9 seconds (ref 11.6–15.2)

## 2010-06-07 LAB — COMPREHENSIVE METABOLIC PANEL
Albumin: 3.4 g/dL — ABNORMAL LOW (ref 3.5–5.2)
BUN: 12 mg/dL (ref 6–23)
Calcium: 8.3 mg/dL — ABNORMAL LOW (ref 8.4–10.5)
Glucose, Bld: 179 mg/dL — ABNORMAL HIGH (ref 70–99)
Potassium: 3.5 mEq/L (ref 3.5–5.1)
Total Protein: 6.3 g/dL (ref 6.0–8.3)

## 2010-06-07 LAB — POCT I-STAT, CHEM 8
Calcium, Ion: 1.17 mmol/L (ref 1.12–1.32)
Glucose, Bld: 96 mg/dL (ref 70–99)
HCT: 39 % (ref 36.0–46.0)
TCO2: 28 mmol/L (ref 0–100)

## 2010-06-07 LAB — CARDIAC PANEL(CRET KIN+CKTOT+MB+TROPI)
CK, MB: 1 ng/mL (ref 0.3–4.0)
CK, MB: 1 ng/mL (ref 0.3–4.0)
Total CK: 53 U/L (ref 7–177)
Troponin I: <0.01 ng/mL (ref 0.00–0.06)

## 2010-06-07 LAB — LIPID PANEL
Cholesterol: 157 mg/dL (ref 0–200)
HDL: 31 mg/dL — ABNORMAL LOW (ref >39)
LDL Cholesterol: 63 mg/dL (ref 0–99)
Triglycerides: 313 mg/dL — ABNORMAL HIGH (ref <150)

## 2010-06-07 LAB — TROPONIN I: Troponin I: <0.01 ng/mL (ref 0.00–0.06)

## 2010-06-07 LAB — TSH: TSH: 1.297 u[IU]/mL (ref 0.350–4.500)

## 2010-06-07 LAB — APTT: aPTT: 71 seconds — ABNORMAL HIGH (ref 24–37)

## 2010-07-05 ENCOUNTER — Encounter: Payer: Self-pay | Admitting: Cardiovascular Disease

## 2010-07-06 ENCOUNTER — Ambulatory Visit (INDEPENDENT_AMBULATORY_CARE_PROVIDER_SITE_OTHER): Payer: Medicaid Other | Admitting: Cardiovascular Disease

## 2010-07-06 ENCOUNTER — Encounter: Payer: Self-pay | Admitting: Cardiovascular Disease

## 2010-07-06 VITALS — BP 118/77 | HR 97 | Resp 20 | Ht 66.0 in | Wt 201.1 lb

## 2010-07-06 DIAGNOSIS — J449 Chronic obstructive pulmonary disease, unspecified: Secondary | ICD-10-CM

## 2010-07-06 DIAGNOSIS — R079 Chest pain, unspecified: Secondary | ICD-10-CM

## 2010-07-06 DIAGNOSIS — I10 Essential (primary) hypertension: Secondary | ICD-10-CM

## 2010-07-06 DIAGNOSIS — F17200 Nicotine dependence, unspecified, uncomplicated: Secondary | ICD-10-CM

## 2010-07-06 DIAGNOSIS — J4489 Other specified chronic obstructive pulmonary disease: Secondary | ICD-10-CM

## 2010-07-06 DIAGNOSIS — I2510 Atherosclerotic heart disease of native coronary artery without angina pectoris: Secondary | ICD-10-CM

## 2010-07-06 DIAGNOSIS — E785 Hyperlipidemia, unspecified: Secondary | ICD-10-CM

## 2010-07-06 MED ORDER — BUPROPION HCL ER (XL) 150 MG PO TB24: ORAL_TABLET | ORAL | Status: DC

## 2010-07-06 NOTE — Assessment & Plan Note (Signed)
No active wheezing. CXR per Dr Dareen Piano ok.  CT 2011 no CA

## 2010-07-06 NOTE — Assessment & Plan Note (Signed)
Cholesterol is at goal.  Continue current dose of statin and diet Rx.  No myalgias or side effects.  F/U  LFT's in 6 months. Lab Results  Component Value Date   LDLCALC  Value: 63        Total Cholesterol/HDL:CHD Risk Coronary Heart Disease Risk Table                     Men   Women  1/2 Average Risk   3.4   3.3  Average Risk       5.0   4.4  2 X Average Risk   9.6   7.1  3 X Average Risk  23.4   11.0        Use the calculated Patient Ratio above and the CHD Risk Table to determine the patient's CHD Risk.        ATP III CLASSIFICATION (LDL):  <100     mg/dL   Optimal  100-129  mg/dL   Near or Above                    Optimal  130-159  mg/dL   Borderline  160-189  mg/dL   High  >190     mg/dL   Very High 08/29/2009             

## 2010-07-06 NOTE — Patient Instructions (Signed)
Your physician recommends that you schedule a follow-up appointment in: ONE YEAR  START [facility] 150MG  TAKE ONE TABLET DAILY FOR 3 DAYS THEN INCREASE TO ONE TABLET TWICE DAILY

## 2010-07-06 NOTE — Assessment & Plan Note (Signed)
CAD with stents in Circ and LAD.  Patent by cath 6/11.  Continue medical RX

## 2010-07-06 NOTE — Assessment & Plan Note (Signed)
Counseled for less than 10 minutes.  Zyban called in

## 2010-07-06 NOTE — Assessment & Plan Note (Signed)
Well controlled.  Continue current medications and low sodium Dash type diet.    

## 2010-07-06 NOTE — Progress Notes (Signed)
F/U for CAD.  Recent hospitalization cared for by Dr Antoine Poche.  I have not seen since 2009  Previous patient of [facility].  Previoius stent to OM and LAD.    Status post Cypher stent to the LAD in 2005. Status post  Cypher stent to the distal   circumflex and OM2 branch in 2005 with balloon angioplasty for restenosis in the circumflex in 2006.  Last cath by Dr Juanda Chance 6/11 with patent stents and no critical stenosis.  Abnormal CXR with "flame" like lesion in left lung.  Had CT 2011  to assess and R/O bronchogenic CA.  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    No angina .   Denies hemoptysis, cough, sputum.  .  Not postural and no history of vertebrobasalar insuff.  Has had an MRI and carotids recently due to chronic dizziness and she indicates they were ok.  Compliant with meds.  Counseled on smoking cessation.  Failed Chantix in past.  Will call in Zyban and she has bee trying the E-cig as well.  Has had CXR with Dr. Dareen Piano in the past year.    ROS: Denies fever, malais, weight loss, blurry vision, decreased visual acuity, cough, sputum, SOB, hemoptysis, pleuritic pain, palpitaitons, heartburn, abdominal pain, melena, lower extremity edema, claudication, or rash.   General: Affect appropriate Healthy:  appears stated age HEENT: normal Neck supple with no adenopathy JVP normal no bruits no thyromegaly Lungs clear with no wheezing and good diaphragmatic motion Heart:  S1/S2 no murmur,rub, gallop or click PMI normal Abdomen: benighn, BS positve, no tenderness, no AAA no bruit.  No HSM or HJR Distal pulses intact with no bruits No edema Neuro non-focal Skin warm and dry No muscular weakness   Current Outpatient Prescriptions  Medication Sig Dispense Refill  . albuterol (VENTOLIN HFA) 108 (90 BASE) MCG/ACT inhaler Inhale 2 puffs into the lungs every 6 (six) hours as needed.        [name] aspirin 81 MG tablet Take 81 mg by mouth daily.        . beclomethasone (QVAR) 80 MCG/ACT inhaler Inhale 1 puff  into the lungs as needed.        . calcium carbonate (OS-CAL) 600 MG TABS Take 600 mg by mouth daily.        . clonazePAM (KLONOPIN) 1 MG tablet Take 1 mg by mouth 2 (two) times daily as needed.        . clopidogrel (PLAVIX) 75 MG tablet Take 75 mg by mouth daily.        . cyclobenzaprine (FLEXERIL) 10 MG tablet Take 10 mg by mouth 2 (two) times daily as needed.        . DULoxetine (CYMBALTA) 30 MG capsule Take 30 mg by mouth 3 (three) times daily.        . ergocalciferol (VITAMIN D2) 50000 UNITS capsule Take 50,000 Units by mouth once a week.        . metoprolol succinate (TOPROL-XL) 25 MG 24 hr tablet Take 25 mg by mouth daily.        . niacin (NIASPAN) 1000 MG CR tablet Take 1,000 mg by mouth at bedtime.        . nitroGLYCERIN (NITROSTAT) 0.4 MG SL tablet Place 0.4 mg under the tongue every 5 (five) minutes as needed.        . salmeterol (SEREVENT DISKUS) 50 MCG/DOSE diskus inhaler Inhale 1 puff into the lungs 2 (two) times daily.        [name]  tiotropium (SPIRIVA HANDIHALER) 18 MCG inhalation capsule Place 18 mcg into inhaler and inhale daily.        . traMADol (ULTRAM) 50 MG tablet Take 50 mg by mouth every 6 (six) hours as needed.        [name] buPROPion (WELLBUTRIN XL) 150 MG 24 hr tablet ONE TABLET ONCE DAILY X 3 DAYS THEN INCREASE TO ONE TABLET TWICE DAILY  63 tablet  11  . oxyCODONE-acetaminophen (PERCOCET) 5-325 MG per tablet Take 1 tablet by mouth every 4 (four) hours as needed.          Allergies  Review of patient's allergies indicates no known allergies.  Electrocardiogram:  NSR  93 ICRBBB low voltage otherwise normal  Assessment and Plan

## 2010-08-03 NOTE — Progress Notes (Signed)
[facility]                        PERIPHERAL VASCULAR OFFICE NOTE   NAME:Palen, RICHANDA DARIN                      MRN:          [id number]  DATE:05/03/2007                            DOB:          05/10/1950    REFERRING PHYSICIAN:  Everardo Beals. Juanda Chance, MD, [facility]   PRIMARY CARDIOLOGIST:  Noralyn Pick. Eden Emms, M.D.   PRIMARY CARE PHYSICIAN:  Marcene Duos, M.D.   REASON FOR CONSULTATION:  Right iliac stenosis and right leg pain.   HISTORY OF PRESENT ILLNESS:  Ms. Pitter is a 60 year old woman with  coronary artery disease.  She recently underwent cardiac catheterization  by Dr. Juanda Chance on April 25, 2007.  Her catheterization demonstrated  multiple patent stents and nonobstructive disease elsewhere.  She is  being managed medically for her CAD.  At the time of catheterization, an  aortogram was performed that demonstrated a fairly long segment of  severe stenosis in the right external iliac artery.   From a symptomatic standpoint, Ms. Delia does complain of right leg  pain.  She has a lot of back problems.  She complains of pain in the  right hip radiating down to the thigh and calf.  Her pain occurs with  sitting, standing and walking.  It is particularly bad with walking  upstairs.  She has a difficult time characterizing the pain, but states  it feels like a squeezing pain.  She is able to walk much better when  she leans forward with a shopping cart.  She has no true rest pain.  She  has no history of skin ulcers or nonhealing wounds.  She has had a  remote TIA, but no stroke.   PAST MEDICAL HISTORY:  1. COPD.  2. Coronary artery disease status post multiple PCI procedures.  3. Remote hysterectomy, appendectomy and bunionectomy.  4. Dyslipidemia   SOCIAL HISTORY:  The patient is separated from her husband.  She has 3  children.  She is originally from [place], but has been living in  [place] for over 30 years.  She is a long-time  smoker of 47 years and  is currently smoking half a pack of cigarettes daily.  She does not  drink alcohol.   FAMILY HISTORY:  Her parents both died at age 94 of cardiac problems.  There is no peripheral arterial disease in the family.   MEDICATIONS:  1. Niaspan 500 mg 2 daily.  2. Metoprolol 50 mg in the morning and 25 mg in the evening.  3. Protonix 40 mg daily.  4. Aspirin 81 mg daily.  5. Plavix 75 mg.  6. Lipitor 40 mg daily.  7. Celexa 40 mg daily.  8. Percocet 5/325 mg 1-2 twice daily as needed.  9. Spiriva daily.  10.Advair Diskus 250/50 mcg daily.  11.Wellbutrin 150 mg daily.   ALLERGIES:  NKDA.   REVIEW OF SYSTEMS:  Complete 12-point review of systems was performed.  Pertinent and positives included headaches, chest pain, dizziness,  lightheadedness shortness of breath, constipation, fatigue, anxiety and  depression.  All other systems were reviewed and are negative except as  described above.  PHYSICAL EXAMINATION:  GENERAL:  The patient is alert, oriented and  obese woman in no acute distress.  VITAL SIGNS:  Weight is 211, blood pressure is 88/52, heart rate 80,  respiratory rate 16.  HEENT:  Normal.  NECK:  Normal carotid upstrokes without bruits.  Jugular venous pressure  is normal.  No thyromegaly or thyroid nodules.  LUNGS:  Clear to auscultation bilaterally.  HEART:  The apex is discrete and nondisplaced.  There is no right  ventricular heave.  Heart is regular rate and rhythm without murmurs or  gallops.  ABDOMEN:  Soft, obese, nontender, no organomegaly, no bruits.  BACK:  There is no CVA tenderness.  EXTREMITIES:  Femoral pulses are 1+ bilaterally.  Dorsalis pedis and  posterior tibial pulses are 2+ and equal bilaterally.  SKIN:  Warm and dry without rash.  LYMPHATICS:  No adenopathy.  NEUROLOGIC:  Cranial nerves II-XII are intact.  Strength is 5/5 and  equal in the arms and legs bilaterally.   DIAGNOSTICS:  Lower extremity physiologic study shows  ABI's of 1.1 on  the right and 1.2 on the left.  Waveforms are brisk and triphasic  bilaterally.   ASSESSMENT:  Ms. Mcgovern is a 60 year old woman with right iliac  stenosis seen at the time of  angiography.  Based on the angiographic  appearance of the lesion where there essentially appeared to be a long  smooth stenosis, I have a strong suspicion that this was related to  vasospasm.  The patient's resting ABI's and waveforms are completely  normal.  It is certainly possible that she has fixed disease in that  area, but I do not think her hip pain sounds like vascular pain.  I  think it is much more likely related to spinal stenosis or  lower back  problem.  Improvement in her symptoms with leaning forward on a shopping  cart is classic for pseudoclaudication.  I am going to check a CT  angiogram of the aortoiliac region to settle the issue.  I think this  will be the best way to determine fixed stenosis versus vasospasm.  While her ABI's are normal, significant iliac stenosis can certainly be  missed with resting physiologic studies.   Ms. Neto is on an excellent medical regimen for her coronary  peripheral arterial disease under the care of Dr. Eden Emms.  I did not  make any changes today.  I will follow up with her after the results of  her CT angiogram are complete.  If this demonstrates no significant  obstructive disease, I will see her back only on a p.r.n. basis.     Veverly Fells. Excell Seltzer, MD  Electronically Signed    MDC/MedQ  DD: 05/03/2007  DT: 05/05/2007  Job #: [id number]   cc:   Everardo Beals. Juanda Chance, MD, [facility]  Peter C. Eden Emms, MD, [facility]  Marcene Duos, M.D.

## 2010-08-03 NOTE — Cardiovascular Report (Signed)
NAME:  JAMIELEE, MCHALE NO.:  [id number]   MEDICAL RECORD NO.:  [id number]          PATIENT TYPE:  OIB   LOCATION:  2899                         FACILITY:  [facility]   PHYSICIAN:  Noralyn Pick. Eden Emms, MD, FACCDATE OF BIRTH:  09/03/1950   DATE OF PROCEDURE:  DATE OF DISCHARGE:  04/25/2007                            CARDIAC CATHETERIZATION   .   CLINICAL HISTORY:  Ms. Linan is a 60 year old and has known coronary  artery disease.  She had a previous Cypher stent placed in the proximal  LAD by [facility].  She had attempted stent placed in the  circumflex system which show was not able to be deployed.  She was  previously seen in consultation by Dr. Eden Emms with symptoms of fatigue,  and he arranged for her to come in for evaluation angiography.   PROCEDURE:  The procedure was performed by the right femoral artery  using arterial sheath.  She also has a stent placed in the circumflex  artery by [facility].   PROCEDURE:  The procedure was performed by right femoral artery using  arterial sheath and 6-French preformed coronary catheters.  A front wall  arterial puncture was performed.  Omnipaque contrast was used.  Distal  aortogram was performed to evaluate peripheral vascular disease.  The  patient tolerated procedure well and left laboratory in satisfactory  condition.   RESULTS:  Left main coronary artery:  Left main coronary artery is free  of significant disease.   Left anterior descending artery.  The left anterior descending artery  gave rise to a diagonal branch and septal perforators.  There was a  Cypher stent in the proximal LAD which had 0% stenosis.  There was 40%  stenosis just distal to the Cypher stent before the diagonal branch.  The distal LAD was free of major obstruction.   The circumflex artery:  The circumflex artery gave rise to a small  marginal branch, a large marginal branch, a posterolateral branch and  posterior descending  branch.  There was 15% stenosis in the second  marginal branch.  There was 0% stenosis at the stent site in the mid  circumflex artery.  The distal vessels were free of major obstruction.   The right coronary artery:  The right coronary artery was a nondominant  vessel that was free of major obstruction.   LEFT VENTRICULOGRAM:  The left ventriculogram was performed in the RAO  projection showed good wall motion with no areas of hypokinesis.  Estimated ejection fraction was 60%.   DISTAL AORTOGRAM:  The distal aortogram was performed which showed  patent renal artery in the right and dual patent renal arteries and the  left.  The aorta and left iliac appeared to be free of major  obstruction.  The right iliofemoral system had severe disease extending  from just beyond the internal iliac vessel to just before the  bifurcation of the femoral artery.  There is long 80% stenosis noted .  There was a more focal 90% stenosis in the femoral artery.   CONCLUSION:  1. Coronary artery status post prior percutaneous coronary  interventions with nonobstructive disease with 0% stenosis at the      stent site in the proximal LAD, 40% narrowing in the proximal LAD      distal to the stent, 0% stenosis at the stent site and mid      circumflex artery with 15% stenosis in the second marginal branch      and no major structures in the small nondominant right coronary      artery and normal LV function.  2. An 80% and 90% stenosis in the right iliofemoral vessel.   RECOMMENDATIONS:  Will plan continued medical therapy for her coronary  heart disease.  I reviewed the films with Dr. Excell Seltzer and he will arrange  for her to come in for evaluation angiography.      Noralyn Pick. Eden Emms, MD, [facility]  Electronically Signed     PCN/MEDQ  D:  04/25/2007  T:  04/26/2007  Job:  [id number]   cc:   Noralyn Pick. Eden Emms, MD, [facility]  [facility]  Veverly Fells. Excell Seltzer, MD  Cardiopulmonary Laboratory

## 2010-08-03 NOTE — Assessment & Plan Note (Signed)
[facility]                            CARDIOLOGY OFFICE NOTE   NAME:Patricia Wall                      MRN:          [id number]  DATE:04/18/2007                            DOB:          Mar 15, 1951    [name] is a delightful 60 year old English woman referred by Dr.  Delrae Alfred.  Unfortunately the patient has previously been seen on  multiple occasions by [facility].  She has been cathed by  them in September 26, 2003 x3 and January 2006.  She has seen Dr. Tresa Endo, Dr.  Allyson Sabal, and Dr. Jacinto Halim.  Apparently, they refused to see her because she  is indigent and a [facility] patient.   Therefore Dr. Delrae Alfred sent the patient to [place].   In talking to the patient she has had recurrent angina in the last month  or so.  She has nitroglycerin and this has helped with her pain.  She  has had substernal pain radiating up in her chest sometimes down left  arm.  There is no associated diaphoresis or shortness of breath.  She  has known coronary artery disease.  Dr. Verl Dicker notes are fairly  confusing.  As far as I can tell, she has had a previous stent to an OM  that has overlapped into the main body of the circ and a previous stent  to the LAD.  Last procedure on January 12, there was apparently a failed  stenting attempt to the distal circ.   The the patient is currently not having rest angina.   She has been compliant with her other meds.   Her past medical history includes a smoking with COPD, previous  bunionectomy, previous hysterectomy, previous appendectomy.  She has  hypertension and hypercholesterolemia.   Family history is remarkable for premature coronary artery disease in  both mother's and father's side of the family.   The patient is indigent.  She is separated.  She has 3 children in the  area and essentially goes from house to house in regards to her living  arrangement.  She continues to smoke a pack a day and does not drink.   Mother and father both died at age 31 of coronary disease.   The patient's medications include Niaspan 1 gram a day, metoprolol 50 mg  a day, Protonix 40 a day, an aspirin a day, Plavix 75 a day, Lipitor 40  a day, Celexa 40 a day, NitroQuick, Percocet, Spiriva, Advair,  Wellbutrin 150 a day, Soma 1 tablet 3 times a day, and albuterol.   She is also on Plavix 75 mg a day.   EXAM:  Is remarkable for chronically ill-appearing middle-aged white  female who looks older than her stated age.  Blood pressure is 122/70, pulse 62 regular, afebrile.  HEENT:  Unremarkable.  Carotids are without bruit, no lymphadenopathy, thyromegaly or JVP  elevation.  Lungs evidence for rhonchi with COPD, mild wheezing.  Good diaphragmatic  motion.  S1-S2 normal heart sounds.  PMI normal.  ABDOMEN:  Benign.  Bowel sounds positive.  No AAA.  Status post  hysterectomy, no bruit, no tenderness,  no hepatosplenomegaly or  hepatojugular reflux.  Distal pulses intact, no edema.   EKG shows normal sinus rhythm without previous MI and low voltage.   IMPRESSION:  1. Recurrent angina in a patient with multiple previous coronary      interventions by [facility] including a failed circ      procedure in 2006.  She needs a repeat heart catheterization.  I      believe this will have to be done in the inpatient lab.  She is      currently stable not having rest pain without EKG changes.  We will      try arrange this for next week.  She will continue her beta      blocker, aspirin and Plavix.  2. Hypertension currently well controlled.  Continue current dose of      beta blocker.  She had previously been on an ACE inhibitor.  Does      not appear to be on one now.  Low-salt diet.  3. Clinical chronic obstructive pulmonary disease.  Continue attempts      at stopping smoking.  Follow-up with Dr. Delrae Alfred for this.      Continue Advair Spiriva.  She is on Wellbutrin but this has not      helped her cut  back.  4. Hypercholesterolemia in setting known coronary disease.  The      Lipitor or 40 a day, lipid and liver profile in 3 months.  5. History of reflux.  Continue Protonix 40 a day.  Avoid late-night      meals and spicy food.   The patient has nitroglycerin at home, we will arrange for heart  catheterization as soon as possible.  I will probably arrange it with  Dr. Riley Kill or Dr. Juanda Chance in the inpatient lab since there is a  possibility of needing a repeat complex intervention, since Dr. Jacinto Halim  had a failed result in 2006.     Noralyn Pick. Eden Emms, MD, [facility]  Electronically Signed    PCN/MedQ  DD: 04/18/2007  DT: 04/18/2007  Job #: [id number]

## 2010-08-06 NOTE — Cardiovascular Report (Signed)
NAME:  Patricia Wall, Patricia Wall NO.:  [id number]   MEDICAL RECORD NO.:  [id number]                   PATIENT TYPE:  INP   LOCATION:  2923                                 FACILITY:  [facility]   PHYSICIAN:  Cristy Hilts. Jacinto Halim, M.D.                  DATE OF BIRTH:  02-22-1951   DATE OF PROCEDURE:  09/30/2003  DATE OF DISCHARGE:                              CARDIAC CATHETERIZATION   PROCEDURE PERFORMED:  Percutaneous transluminal coronary angioplasty and  stenting of the circumflex system including distal circumflex and obtuse  marginal two branch of the circumflex coronary artery.   INDICATION:  Patricia Wall is a 61 year old female with no significant prior  cardiac history who was admitted to the hospital with unstable angina.  She  had undergone diagnostic cardiac catheterization on September 29, 2003 and was  found to have high grade lesion of circumflex and obtuse marginal stenosis  along with proximal LAD stenosis.  After discussion was held with my  partners, I decided to proceed with percutaneous coronary intervention.  The  risks and benefits of these procedures were explained to the patient and  also to her children.  Eventually, we decided to proceed with percutaneous  coronary intervention versus coronary artery bypass grafting.  After  obtaining informed consent, she was brought to the cardiac catheterization  lab for elective PCI.   HEMODYNAMIC DATA:  The opening aortic pressures were 131/78 with mean of 99  mmHg.  The closing blood pressure was 126/78 mmHg.   INTERVENTIONAL DATA:  Successful percutaneous transluminal coronary  angioplasty and direct stenting of the distal circumflex coronary artery  with a 2.5 x 18-mm Cypher at 16 atmospheric pressure with reduction of  stenosis of 80% to 0%.  There is TIMI-3 to TIMI-3 flow maintained at the end  of the procedure.   Successful percutaneous transluminal coronary angioplasty and stenting of  the OM-2 branch of  the circumflex coronary artery with 2.5 x 13-mm and a 2.5  x 8-mm Cypher stent which was overlapped.  The stenosis was reduced from 80%  to 0%.  There was minimal stent jailing and stent protrusion into the main  circumflex coronary artery.  However, there was excellent flow with no  evidence of dissection or thrombus.   RECOMMENDATIONS:  The patient will be continued on aspirin and Plavix for at  least a period of 9 months to a year given her  unstable angina.  She will  also need elective PCI of her LAD.  The timing of which will be discussed at  a later time.   The patient was transferred to the CICU for observation overnight as there  was a questionable perforation.  Otherwise, the patient tolerated the  procedure well. She had remained hemodynamically stable.   TECHNIQUE OF PROCEDURE:  Under usual sterile precautions, using a 7 French  right femoral arterial access, a 7 [place] FL-4 guide  was advanced into the  ascending aorta over a 0.035-inch J wire.  The left main coronary artery was  selectively engaged and angiography was performed.  A 190 cm x 0.014 inch  ATW guide wire was utilized to cross through the distal circumflex lesion  and the tip of the wire was carefully positioned in the distal circumflex  coronary artery.  The lesion length was measured with the help of the  markers.  Then, a 2.5 x 18-mm Cypher stent was advanced into the lesion and  primary stent deployment was performed at 16 atmospheric pressure for a  total of 80 seconds.  The balloon was deflated and pulled back in the  guiding catheter. Angiography was performed.  There was blushing noted in  the periphery of the stent which was also seen previously.  However, she  suddenly became hypotensive with systolic blood pressure dropping from 130s  to 80s.  Hence, perforation was suspected.  Hence, the same stent balloon  was readvanced back into the lesion and gentle pressure balloon dilatation  up to 6  atmospheric pressure for 195 seconds was performed and after balloon  deflation repeat angiography revealed excellent flow with no suggestion of  any perforation.  A bed side echocardiogram was obtained to evaluate any  possible perforation and pericardial effusion.  However, there was no  significant effusion noted that would cause significant hemodynamic  consequence.  The patient transiently was started on IV Dopamine and IV  fluids was open.  However, this was turned off in the middle of the  procedure and the patient remained throughout stable hemodynamically.  After  confirming that there was no pericardial effusion and tamponade and after  confirming angiographic success of PCI into the distal circumflex coronary  artery, the same ATW guide wire was pulled back and readvanced into OM-2  branch and angiography was performed and the lesion length was again  measured.  Then, a 2.5 x 8-mm Cypher stent was advanced through this lesion  and after positioning the stent very carefully in multiple views the stent  was deployed at 10 atmospheric pressure for 30 seconds.  The balloon was  deflated, pulled back into the guiding catheter and angiography was  performed.  The lower lip of the stent in the proximal edge had slightly  projected into the main circumflex coronary artery and there is also mild  spasm of the main circumflex coronary artery in the mid segment.  In the  outflow of the stent there was also haziness noted suggestion of a  dissection.  Hence, a 2.5 x 8-mm Cypher stent was advanced through the  previously placed stent and after overlapping the stent at the distal end  the stent was deployed at 12 atmospheric pressure for 40 seconds.  Then, the  balloon was deflated and pulled back within the two stents and a second  inflation at 16 atmospheric pressure for 24 seconds was performed. Then, the balloon was deflated and pulled back in the guiding catheter and 200 mcg of   intracoronary nitroglycerin was administered and angiography was repeated.  Prior to the second stent implantation into the obtuse marginal branch, the  nitroglycerin was administered.  Overall, angiography revealed excellent  results.   Except during the procedure of balloon inflations, the patient remained  chest pain free.  Except for a brief episode of hypotension, she was  hemodynamically stable with no requirement for either Dopamine or IV fluid  resuscitation.  Again, post procedure I did do  a bedside echocardiogram with  a hand-held device and again there was only possible minimal pericardial  effusion versus fat pad, but no significant effusion.  The patient was  transferred to the CICU for overnight observation.  The patient overall  tolerated the procedure well.                                               Cristy Hilts. Jacinto Halim, M.D.    Pilar Plate  D:  09/30/2003  T:  10/01/2003  Job:  [id number]

## 2010-08-06 NOTE — Cardiovascular Report (Signed)
NAME:  Patricia Wall, Patricia Wall NO.:  [id number]   MEDICAL RECORD NO.:  [id number]                   PATIENT TYPE:  INP   LOCATION:  6599                                 FACILITY:  [facility]   PHYSICIAN:  Cristy Hilts. Jacinto Halim, M.D.                  DATE OF BIRTH:  [birth year]   DATE OF PROCEDURE:  09/29/2003  DATE OF DISCHARGE:                              CARDIAC CATHETERIZATION   PROCEDURE PERFORMED:  1. Left ventriculography.  2. Selective right and left coronary arteriography.  3. Right innominate and left subclavian arteriography including     visualization of RIMA and LIMA.  4. Abdominal aortogram.  5. Intracoronary administration of nitroglycerin.   INDICATION:  [name] is a 60 year old Caucasian female with no  significant cardiac history, family history of premature coronary artery  disease and smoking who was admitted to the hospital with a very significant  chest discomfort suggestive of unstable angina.  She was ruled out for  myocardial infarction and started on nitroglycerin and heparin and was  referred for cardiac catheterization for definitive diagnosis of coronary  disease with high suspicion for underlying coronary artery disease.   HEMODYNAMIC DATA:  1. The left ventricular pressures were 117/70 with end-diastolic pressure of     28 mmHg.  2. Aortic pressure 110/55 with a mean of 80 mmHg.  3. There was no pressure gradient across the aortic valve.   ANGIOGRAPHIC DATA:  Left ventricle:  Left ventricular systolic function was  normal and ejection fraction was estimated at 55%.  There was no significant  mitral regurgitation.   Right coronary artery:  The right coronary artery is a small vessel and  nondominant vessel and has mid 50-70% stenosis.   Left main coronary artery:  Left main coronary artery is a large caliber  vessel. It is normal. It is very short.   Circumflex coronary artery:  Circumflex coronary artery is a large caliber  vessel and a dominant vessel.  It gives origin to large obtuse marginal one  which has proximal 30% luminal irregularity.  From the mid segment it gives  origin to moderate to large size obtuse marginal two which has proximal what  appears like an ulcerated 80% stenosis.  The distal circumflex itself which  continues as PDA has an 80% focal stenosis.   Left anterior descending artery:  Left anterior descending artery is a large  caliber vessel.  In the proximal segment has a long segment 80% stenosis.  The mid segment has 30 and mid to distal segment has 40% mild luminal  irregularity.  The diagonal one in the proximal segment is very small and  diagonal two is moderate caliber vessel.   The right innominate and RIMA, left subclavian and LIMA were widely patent.   Abdominal aortogram revealed presence of two renal arteries, one on either  side.  Widely patent.  Aortoiliac bifurcation was grossly  normal.   IMPRESSION:  1. Normal left ventricular systolic function with ejection fraction of 55%.  2. Moderate to marked elevation of left ventricular end-diastolic pressure     secondary to underlying coronary artery disease.  3. Severe two-vessel coronary artery disease including proximal left     anterior descending, distal circumflex and OM-2.   RECOMMENDATIONS:  Based on the coronary anatomy, the patient will benefit  from revascularization.  The patient will be taken off of the table to  discuss the options of revascularization versus coronary artery bypass graft  versus PCI.  Further recommendations will follow.   TECHNIQUE OF PROCEDURE:  Under usual sterile precautions, using a 6 French  right femoral arterial access, a 6 [place] multipurpose P2 catheter was  advanced into the ascending aorta over a 0.035-inch J wire.  The catheter  was then gently advanced to the left ventricle and left ventricular  pressures were monitored.  Hand contrast injection of the left ventricle was   performed both in the LAO and RAO projection.  The catheter was flushed with  saline and pulled back into the ascending aorta and pressure gradient across  the aortic valve was monitored.  The right coronary artery was selectively  engaged and angiography was performed.  Then, the catheter was pulled back  in the abdominal aorta and angiography was performed.  Then, a 6 French  Judkins left 4 diagnostic catheter was utilized to engage the left main  coronary artery and angiography was performed.  200 mcg of intracoronary  nitroglycerin was administered.  Angiography was repeated.  Then, the  catheter was pulled out of the body in the usual fashion.  The multipurpose  P2 catheter was readvanced into the arch of the aorta and right innominate  and left subclavian artery was selectively cannulated and angiography was  performed.  Then, the catheter was pulled out of the body in the usual  fashion.  The patient tolerated the procedure well.                                               Cristy Hilts. Jacinto Halim, M.D.    Pilar Plate  D:  09/29/2003  T:  09/29/2003  Job:  [id number]

## 2010-08-06 NOTE — Discharge Summary (Signed)
NAME:  Patricia Wall, Patricia Wall NO.:  [id number]   MEDICAL RECORD NO.:  [id number]                   PATIENT TYPE:  INP   LOCATION:  2923                                 FACILITY:  [facility]   PHYSICIAN:  Madaline Savage, M.D.             DATE OF BIRTH:  1950-10-13   DATE OF ADMISSION:  09/27/2003  DATE OF DISCHARGE:  10/01/2003                                 DISCHARGE SUMMARY   DISCHARGE DIAGNOSES:  1. Chest pain compatible with unstable angina pectoris on admission, ruled     out for myocardial infarction by negative cardiac enzymes and no active     acute EKG changes.  2. Coronary artery disease, status post cardiac catheterization, this     admission.  3. Hypertension.  4. Positive family history of coronary artery disease.  5. Hypertriglyceridemia.  6. Ongoing tobacco use.   HISTORY OF PRESENT ILLNESS/HOSPITAL COURSE:  This is a 60 year old Caucasian  female who was in the usual state of health up until a week ago, when she  developed some sort of weakness very usual for her and she described it like  staying tired all the time and sleeping 9 hours straight the night prior to  her admission, which is not usual for her also.  She also complained of  malaise, mild headache and the night prior to her admission, she was at work  -- she works on the third shift -- and at 7 p.m., developed the chest pain  which she described like something heavy sitting on my chest and radiating  to the left arm, left neck and jaw line.  She became mostly short of breath  and dizzy.  The patient was transported to the emergency room from her job  and was given nitroglycerin IV with relief of symptoms.   Past medical history:  She denied any hypertension, diabetes mellitus or  hyperlipidemia.  She is status post hysterectomy and appendectomy.  The  patient had a similar episode of chest pain approximately 2 years ago by did  not see any physician at that time secondary to not  having access to medical  insurance and coverage for medication.   Her family history is significant for coronary artery disease; both parents  had myocardial infarction.   She was admitted to the telemetry unit, started on IV nitroglycerin and  heparin and decision was made to proceed with cardiac catheterization.  The  cath was performed on Monday, September 29, 2003, since the patient was admitted  on Friday night, and it was performed by Dr. Cristy Hilts. Ganji and revealed  severe three-vessel coronary artery disease and it felt like her circumflex  could be the culprit point with ulcerated plaque and 80% stenosis.  At that  time, Dr. Jacinto Halim decided to stop the cath and make it as diagnostic only and  consult with colleagues about further therapy, PCI versus CABG.  The patient  was aware of all the therapeutic options and chose to proceed with PCI.  Staged intervention was performed on September 30, 2003 and she had a CYPHER  stent implanted into circumflex artery and obtuse marginal 2 with great  results.  Attempt to intervene on LAD was unsuccessful and that lesion was  left untouched and decision was made to discharge the patient with  outpatient Cardiolite and treat her medically and if she develops more  episodes of chest pain that would not be controlled by medical therapy, then  intervention of the LAD could be considered.   The patient tolerated the procedure well and was ambulating in the hall  without difficulty and was considered stable for discharge on October 02, 2003.   HOSPITAL LABORATORIES AND PERTINENT STUDIES:  Basic metabolic panel revealed  sodium 137, potassium 4.3, chloride 103, carbon dioxide 29, glucose 98, BUN  10, creatinine 0.8.  CBC showed white blood cell count 7.6, hemoglobin 13.2,  hematocrit 38.0, platelet count 217,000.  Cardiac panel on admission showed  CK 75, CK-MB 1.0 and troponin 0.02.  Cardiac panel post cath revealed CK 51,  CK-MB 1.1 and troponin 0.11.  Lipid  profile showed triglycerides 251, total  cholesterol 176, LDL 99, HDL 50.   DISCHARGE MEDICATIONS:  1. Lopressor 25 mg b.i.d.  2. Niacin 500 mg nightly.  3. Lipitor 40 mg nightly.  4. Plavix 75 mg daily.  5. Aspirin 81 mg daily.  6. Altace 2.5 mg daily.  7. Protonix 40 mg daily.  8. Nitroglycerin 0.4 mg daily p.r.n.   ACTIVITY:  No driving, no heavy lifting greater than 5 pounds, no strenuous  activities for 5 days post cath.   DISCHARGE DIET:  Low-fat, low-cholesterol, low-salt diet.   WOUND CARE:  The patient was allowed to shower, instructed to report any  problems with groin puncture site to our office; the number is provided.   DISCHARGE FOLLOWUP:  Dr. Elsie Lincoln will see her on October 20, 2003 at 11 a.m.      Raymon Mutton, P.A.                    Madaline Savage, M.D.    MK/MEDQ  D:  10/02/2003  T:  10/02/2003  Job:  [id number]   cc:   [facility]   Madaline Savage, M.D.  [address]  [place]  [place] [address]  Fax: [phone]

## 2010-08-06 NOTE — Cardiovascular Report (Signed)
NAME:  Patricia Wall, Patricia Wall NO.:  [id number]   MEDICAL RECORD NO.:  [id number]                   PATIENT TYPE:  INP   LOCATION:  2922                                 FACILITY:  [facility]   PHYSICIAN:  Cristy Hilts. Jacinto Halim, M.D.                  DATE OF BIRTH:  1951-01-24   DATE OF PROCEDURE:  10/09/2003  DATE OF DISCHARGE:                              CARDIAC CATHETERIZATION   ATTENDING CARDIOLOGIST:  Madaline Savage, M.D.   PROCEDURE PERFORMED:  Percutaneous transluminal coronary angioplasty and  stenting of the proximal and ostial left anterior descending.   INDICATION:  [name] is a 60 year old female who had prior cardiac  catheterization and intervention to the circumflex on September 30, 2003 with a  2.5 x 13 and a 2.5 x 8 mm overlapping stents in the obtuse marginal two and  a 2.5 x 18-mm stent in the distal circumflex on September 30, 2003 with  utilization of  Cypher stents was readmitted to the hospital on October 09, 2003 with unstable angina.  She underwent diagnostic cardiac catheterization  by Dr. Nanetta Batty earlier this morning on an emergent basis and was  found to have widely patent previous stents.  She has known proximal and  ostial LAD stenosis.  Given this, her sheaths were left in place for  possible PCI.  After re-evaluating her symptoms and looking at the  angiograms it was decided to proceed with PCI of her LAD which was planned  previously to be done on an elective basis in a staged fashion.  Because of  her chest pain, she was brought to the cardiac catheterization lab and PCI  was performed.   HEMODYNAMIC DATA:  The opening aortic pressures were 116/79 with heart rate  of 78 mmHg.  The closing pressures were 106/62 with heart rate 82  beats/minute.  The patient remained hemodynamically stable and chest pain  free at the end of the procedure.   ANGIOGRAPHIC DATA:  Please see diagnostic cardiac catheterization by Dr.  Nanetta Batty  done earlier this morning.   Briefly, the left main was widely patent with no significant disease.   Circumflex was dominant and had OM-1, OM-2 and continues as distal  circumflex as PDA.  The previously stents in the OM-2 and distal circumflex  were widely patent.   LAD:  The LAD has a complex proximal 80% stenosis with heavy plaque burden.  The mid segment has 30-40% stenosis which is unchanged from prior cardiac  catheterization.   INTERVENTIONAL DATA:  Successful percutaneous transluminal coronary  angioplasty and stenting of the proximal LAD with 3.0 x 23-mm Cypher  deployed at 12 atmospheric pressure.  This stent was post dilated to 3.25 x  15-mm Quantum at 14 atmospheric pressure x2.  This gave a 3.29 mm lumen.  At  the end of the procedure the stenosis was reduced from 80% to  0% with TIMI-3  to TIMI-3 flow maintained at the end of the procedure.  There was nice  stepup and stepdown.   RECOMMENDATIONS:  Continue integrilin for 12 to 18 hours.  The sheaths will  be pulled out once her ACT comes down to less than 160. Will followup with  CPKs and troponin.  Continue evaluation for risk factor modification and  smoking cessation is indicated.   TECHNIQUE OF PROCEDURE:  Under usual sterile precautions, the 6 French  sheath that was sutured in place previously was exchanged to a 7 [place]  sheath under aseptic precautions.  Then, a 7 [place] guide catheter was  utilized to engage the left main coronary artery.  Angiography was repeated.  Then, 100 mcg intracoronary nitroglycerin was administered for evaluation of  spasm in the proximal circumflex.  Then, a 190 cm x 0.014 inch ATW marker  guide wire was utilized to cross into the LAD and angiography was repeated.  The lesion length was measured.  Then, a 3.0 x 20-mm Quantum balloon was  utilized and two dilatations at 13 and 14 atmospheric pressure for 58 and 41  seconds were performed respectively.  The balloon was deflated and  pulled  back into the guiding catheter, 100 mcg of intracoronary nitroglycerin was  readministered and angiography was repeated.  There was recoil noted in the  LAD.  Then, a 3.0 x 23-mm Cypher stent was advanced into the LAD and after  positioning the stent at the ostium of the LAD, the stent was deployed at 12  atmospheric pressure for 33 seconds.  The stent balloon was deflated and  pulled back into the guiding catheter and angiography was repeated.  Excellent results were noted.  However, there was a waste in the middle of  the stent.  Then, a 3.25 x 15-mm Quantum Maverick noncompliant balloon was  utilized and placing the balloon within the stent two inflations at 14  atmospheric pressure at 29 and 18 seconds were performed.  Then, the balloon  was deflated, 100 mcg of intracoronary nitroglycerin was readministered.  Angiography was repeated.  Excellent results were noted.  Then, the guide  wire was withdrawn out of the body, angiography was repeated.  Guide  catheter disengaged and pulled out of the body in the usual fashion.  The  patient tolerated the procedure well.  No immediate complications were  noted.                                               Cristy Hilts. Jacinto Halim, M.D.    Pilar Plate  D:  10/09/2003  T:  10/09/2003  Job:  [id number]   cc:   Madaline Savage, M.D.  [address], [address]  [place]  [place] [address]  Fax: [phone]

## 2010-08-06 NOTE — Discharge Summary (Signed)
NAME:  Patricia Wall, [name] NO.:  [id number]   MEDICAL RECORD NO.:  [id number]                   PATIENT TYPE:  INP   LOCATION:  2922                                 FACILITY:  [facility]   PHYSICIAN:  Cristy Hilts. Jacinto Halim, M.D.                  DATE OF BIRTH:  1950-11-21   DATE OF ADMISSION:  10/09/2003  DATE OF DISCHARGE:  10/10/2003                                 DISCHARGE SUMMARY   ADMISSION DIAGNOSES:  1. Unstable angina.  2. History of coronary artery disease.     a. Status post cardiac catheterization on the previous week, September 30, 2003, revealing three-vessel coronary artery disease.  She underwent        staged percutaneous intervention with a circumflex stent, second        obtuse marginal stent and unsuccessful left anterior descending        intervention at that time.  3. Hypertension.  4. Hyperlipidemia.  5. History of appendectomy.  6. History of bunionectomy.  7. History of hysterectomy.  8. Ongoing tobacco use.  9. Positive strong family history of coronary artery disease.   DISCHARGE DIAGNOSES:  1. Unstable angina.  2. History of coronary artery disease.     a. Status post cardiac catheterization on the previous week, September 30, 2003, revealing three-vessel coronary artery disease.  She underwent        staged percutaneous intervention with a circumflex stent, second        obtuse marginal stent and unsuccessful left anterior descending        intervention at that time.  3. Hypertension.  4. Hyperlipidemia.  5. History of appendectomy.  6. History of bunionectomy.  7. History of hysterectomy.  8. Ongoing tobacco use.  9. Positive strong family history of coronary artery disease.  10.      Status post diagnostic catheterization by Dr. Nanetta Batty on the     early morning of October 09, 2003.  11.      Status post cardiac catheterization with intervention on October 09, 2003 by Dr. Cristy Hilts. Ganji.  He performed successful  percutaneous coronary     intervention of the proximal/ostial left anterior descending.   HISTORY OF PRESENT ILLNESS:  Patricia Wall is a 60 year old white female who  was admitted to [facility] on October 09, 2003 for unstable angina.  It was noted that she had just been discharged from the hospital 1 week  prior.   She had presented last week with unstable angina.  At that time, she  underwent cardiac catheterization.  This demonstrated severe three-vessel  CAD.  Though coronary bypass grafting was offered to the patient, she chose  to undergo staged intervention.  She first received a stent to the  circumflex, as it was felt to  be the culprit vessel with an ulcerated  plaque.  She then received a stent to the second obtuse marginal.  Attempt  to intervene on the LAD was unsuccessful.  The decision was made to  discharge the patient from medical therapy and for future outpatient  Cardiolite study to guide further management.  It was felt that another  intervention on the LAD could be considered.   She then presented to the emergency room on the evening of October 09, 2003  with chest pain which began that morning.  It had continued to wax and wane  throughout the course of the day.  Her chest discomfort was described as a  substernal pressure.  It radiated to the left arm.  It was associated with  nausea but no dyspnea/diaphoresis.  Three nitroglycerin tablets taken at  home appeared not to extract her pain.  She continued to experience chest  pain at the time of Dr. Quita Skye. Collman's evaluation in the emergency  room, though it was somewhat improved upon presenting to the ER.   On exam at that time, blood pressure 103/60, heart rate 82.  No significant  abnormalities on physical exam.  Her EKG was normal with sinus rhythm.  Initial set of cardiac markers were normal.   At this point, she was seen and evaluated by Dr. Earnest Conroy.  It was felt  that she was experiencing  unstable angina.  He planned for admission to a  stepdown unit, to check serial enzymes to rule out MI.  She would be placed  on IV heparin and IV Integrilin.  He would continue aspirin.  He would hold  Plavix in the event that she would need bypass surgery.  He planned for IV  nitroglycerin, continued beta blocker, statin therapy and her other  medications.  He would plan for further measures by Dr. Jacinto Halim in the  morning.  He felt that emergent catheterization would be necessary if she  got no relief with the above measures.   HOSPITAL COURSE:  After the above measures were obtained, she was unable to  obtain complete freedom of pain.  Therefore, she underwent urgent  catheterization by Dr. Allyson Sabal on the early morning hours of October 09, 2003.  At that time, she was found to 75% to 80% stenosis in the very proximal part  of the LAD.  However, this was not changed from the cath last week.  He was  uncertain if this was the cause of her chest pain.  At that point, he  planned to continue medical therapy and discuss with Dr. Jacinto Halim and review  films with colleagues.  She tolerated the procedure without complication.   Films were reviewed by Dr. Jacinto Halim and it was felt that intervention to the  LAD would be the best course of action at this point.   Later on the day of October 09, 2003, she underwent repeat catheterization by  Dr. Jacinto Halim and intervention to the LAD.  She had successful PCI of the  proximal/ostial LAD.  He planned to continue Integrilin for 12-18 hours post  intervention, follow up cardiac enzymes and follow up on smoking cessation  again.   She tolerated the procedure well without complications.   On the morning of October 10, 2003, Patricia Wall is doing well.  She is having  no further chest pain, no shortness of breath, no other problems.  She is  afebrile at 97.6, pulse is 85, blood pressure 92-110/42-55, oxygen saturation 95% on  room air.  Labs are stable with hemoglobin 12.4,   hematocrit 35.9, BUN 11, creatinine 0.8, post cath dye.  Cardiac enzymes  have been negative x3.  At this point, she is seen and evaluated by Dr. Yates Decamp.  Her right groin is stable with no hematoma, no bleed.  However, she  does have some sacral induration but no ulceration; this probably from her  prolonged bedrest with her recent hospitalization.  We will get wound care  to see her for evaluation prior to her discharge home.  Once this is done,  she is deemed stable for discharge home by Dr. Yates Decamp.   HOSPITAL CONSULTS:  She will have a wound care consult prior to discharge  home, as well as her smoking cessation consult.   HOSPITAL PROCEDURES:  1. Diagnostic cardiac catheterization by Dr. Nanetta Batty on the early     morning of October 09, 2003.  2. Repeat cardiac catheterization with intervention during the day of October 09, 2003 by Dr. Yates Decamp.  He performed successful percutaneous coronary     intervention of the proximal/ostial left anterior descending.  Integrilin     was continued for 12-18 hours post procedure.  She tolerated the     procedure well without complication.   LABORATORIES:  Cardiac enzymes are negative x3 with CKs of 63, 61, 69.  CK-  MBs are 0.8, 0.8, 1.2.  Troponin is 0.01, 0.02, 0.03.  On admission, sodium  was 132, potassium 3.4, BUN 13, creatinine 0.7, glucose 139; white count  9.6, hemoglobin 11.8, hematocrit 34.5, platelets 282,000.  Magnesium 2.0.  Potassium was repleted.  At the time of discharge home, white count is 8.6,  hemoglobin 12.4, hematocrit 35.9, platelets 230,000.  Sodium 132, potassium  3.9, BUN 11, creatinine 0.8, glucose 106.   RADIOLOGY:  Chest x-ray shows no active cardiopulmonary disease.   EKG:  EKG shows normal sinus rhythm, incomplete right bundle branch block,  nonspecific ST-T change.   DISCHARGE MEDICATIONS:  1. Plavix 75 mg a day.  2. Aspirin 81 mg a day.  3. Lopressor 25 mg twice a day.  4. Altace 2.5 mg once a  day.  5. Prilosec 20 mg or Prevacid 30 mg once a day.  6. Niaspan 500 mg 1 each night.  7. Lipitor 40 mg a day.  8. Nitroglycerin 0.4 mg sublingual as directed.   ACTIVITY:  No strenuous activity, lifting greater than 5 pounds, driving or  sexual activity for 3 days.   DIET:  Low-cholesterol diet.   WOUND CARE:  May wash your groin site with warm water and soap.   Call [phone] if any bleeding or increased size or pain in the groin site.   FOLLOWUP:  Follow up with Dr. Jacinto Halim, Wednesday, October 22, 2003, at 9 a.m.   It was noted that originally the patient was to follow up with Dr. Madaline Savage.  However, at this time, the patient is requesting to follow up  with Dr. Jacinto Halim instead.      Mary B. Remer Macho, P.A.-C.                   Cristy Hilts. Jacinto Halim, M.D.    MBE/MEDQ  D:  10/10/2003  T:  10/11/2003  Job:  [id number]   cc:   Cristy Hilts. Jacinto Halim, M.D.  [address]  [place]  [place] [address] Fax: [phone]

## 2010-08-06 NOTE — Discharge Summary (Signed)
NAMEMarland Kitchen  LATREASE, KUNDE NO.:  [id number]   MEDICAL RECORD NO.:  [id number]          PATIENT TYPE:  INP   LOCATION:  6524                         FACILITY:  [facility]   PHYSICIAN:  Cristy Hilts. Jacinto Halim, MD       DATE OF BIRTH:  Aug 01, 1950   DATE OF ADMISSION:  03/31/2004  DATE OF DISCHARGE:                                 DISCHARGE SUMMARY   [name] is a 60 year old female patient who has been seen by Dr.  Jacinto Halim in the past. She is a primary care patient of [facility], Dr.  Reche Dixon. She apparently came to the emergency room because she had some chest  pain. She has known CAD with a stent placed in her circumflex in 7/05.  Recatheterization later on and she had stents placed in her proximal LAD.  She was admitted, she received IV heparin. She underwent cardiac  catheterization on April 01, 2004 by Dr. Yates Decamp. She was found to have  patent LAD. She had a 50% blockage in her mid-LAD past the stent. She had an  OM2 stent was patent. A circumflex stent was patent; however, she had candy-  wrapper stenosis on either end of the stent thus she underwent successful  POBA. Dr. Jacinto Halim was not able to deploy a stent. She had a nondominant RCA  with no disease. The blockages were reduced from 90% to less than 20 in the  distal area of the stent and reduced from 70 to 20% of the proximal end of  the stent. On the morning of April 02, 2004, her blood pressure was 92/48,  her heart rate was 64 and regular, her temperature was 97.2. Her sodium was  134, potassium 3.6. Hemoglobin 11.2, hematocrit 32, platelets 154. She was  considered stable for discharge home. Dr. Jacinto Halim did put her on Pletal for  more platelet inhibition.   DISCHARGE DIAGNOSES:  1.  Unstable angina.  2.  Known coronary artery disease with progressive disease status post      catheterization on April 01, 2004 by Dr. Jeanella Cara. She had a patent      left anterior descending stent, a patent obtuse marginal two  stent, and      a patent left circumflex stent; however, she had candy-wrapper stenosis      before and after the circumflex stent. She had a plain balloon      angioplasty reduced from 70 to 20% proximally and 90 to less than 20%      distally.  3.  Ejection fraction of 55 to 60%.  4.  Tobacco smoking. She will try to quit.  5.  Mixed hyperlipidemia.  6.  Hypertension.   DISCHARGE MEDICATIONS:  1.  Niacin 500 mg at bedtime.  2.  Lotrel 40 mg one time per day.  3.  Plavix 75 mg one time per day.  4.  Metoprolol 25 mg two times per day.  5.  Altace 2.5 mg one time per day.  6.  Aspirin 81 mg one time per day.  7.  Pletal 50 mg two times per  day.  8.  Nitroglycerin p.r.n.   ACTIVITY:  She should do no strenuous activity, no pushing/pulling, no  lifting x1 week.   DISCHARGE DIET:  She should be on a low-fat diet.   DISCHARGE INSTRUCTIONS:  If she has any problems with her groin, she can  call our office.   FOLLOWUP:  She will follow up with Dr. Jacinto Halim on 2/22 at 11 a.m.      Beve   BB/MEDQ  D:  04/02/2004  T:  04/02/2004  Job:  [id number]   cc:   Dineen Kid. Reche Dixon, M.D.  [address] [place]  [place] [address]  Fax: [phone]

## 2010-08-06 NOTE — Cardiovascular Report (Signed)
NAME:  Patricia Wall, Patricia Wall NO.:  [id number]   MEDICAL RECORD NO.:  [id number]                   PATIENT TYPE:  INP   LOCATION:  2922                                 FACILITY:  [facility]   PHYSICIAN:  Nanetta Batty, M.D.                DATE OF BIRTH:  [birth year]   DATE OF PROCEDURE:  10/09/2003  DATE OF DISCHARGE:                              CARDIAC CATHETERIZATION   INDICATIONS:  [name] is a 60 year old female with history of coronary  artery disease and unstable angina.  She underwent cardiac catheterization  by Dr. Jeanella Cara September 29, 2003 and had stenting of her distal circumflex and  second obtuse marginal branch the following day.  She had moderately severe  segmental proximal LAD disease which was scheduled to be intervened on in a  staged fashion.  She developed chest pain this evening and was brought to  [facility] Emergency Room where she was seen by Dr. Darol Destine.  She  was treated with sublingual nitroglycerin, IV heparin, integrilin with  continued chest pain and hypotension requiring Dobutamine.  There were no  electrocardiographic changes and point-of-care markers were negative.  She  was brought to the cath lab emergently for coronary angiography.   PROCEDURE DESCRIPTION:  The patient was brought to the second floor [facility] in the postabsorptive state.  She was  premedicated with p.o. Valium.  Her right groin was prepped and shaved in  the usual sterile fashion.  1% Xylocaine was used for local anesthesia.  A 6  French sheath was inserted into the right femoral artery using standard  Seldinger technique. A 6 French right and left Judkins diagnostic catheter  as well as 6 French pigtail catheter were used for selective coronary  angiography, left ventriculography, and supravalvular aortography.  Visipaque dye was used for the entirety of the case.  Retrograde aortic,  ventricular pullback pressures were  recorded.   HEMODYNAMICS:  1. Aortic systolic pressure 153, diastolic pressure 76.  2. Left ventricular systolic pressure 152, end-diastolic pressure 29.   SELECTIVE CORONARY ANGIOGRAPHY:  1. Left main normal.  2. LAD:  The LAD had segmental 75-80% stenosis beginning just after the left     main and terminating at the first septal perforator.  There was a 50%     segmental mid stenosis in the mid LAD after the first diagonal branch.  3. Left circumflex:  This is a dominant vessel with patent stents in the     second obtuse marginal branch in the distal circumflex which were widely     patent.  4. Right coronary artery:  Nondominant and widely patent.   LEFT VENTRICULOGRAPHY:  RAO and LAO left ventriculograms were performed  using 25 mL of Visipaque dye at 12 mL per second in each view.  There were  no wall motion abnormalities noted.  EF was estimated at greater than  60%.   SUPRAVALVULAR AORTOGRAPHY:  Performed in the LAO view using 20 mL of  Visipaque dye at 20 mL per second.  There was no evidence of aortic  dissection.  Arch vessels were intact.   IMPRESSION:  [name] has mildly patent circumflex stents with segmental  proximal left anterior descending disease in the 75% range unchanged from  prior angiogram.  I am unsure of the etiology of her chest pain though in  this setting I prefer not to proceed with left anterior descending  intervention.  Will maintain her on integrilin and  keep her sheaths in this morning while we review her angiograms with the  rest of my colleagues prior to making a decision with regards to medical  therapy or left anterior descending intervention.   The sheaths were sewn securely in place and the patient left the lab in  stable condition.                                               Nanetta Batty, M.D.    Cordelia Pen  D:  10/09/2003  T:  10/09/2003  Job:  [id number]   cc:   [facility]  [address]   [place], [place] [address]

## 2010-08-06 NOTE — Op Note (Signed)
NAME:  Patricia Wall, Patricia Wall               ACCOUNT NO.:  [id number]   MEDICAL RECORD NO.:  [id number]          PATIENT TYPE:  AMB   LOCATION:  ENDO                         FACILITY:  [facility]   PHYSICIAN:  James L. Malon Kindle., M.D.DATE OF BIRTH:  [birth year]   DATE OF PROCEDURE:  12/21/2005  DATE OF DISCHARGE:                                 OPERATIVE REPORT   PROCEDURE:  Colonoscopy.   MEDICATIONS:  1. Phenergan 12.5 mg.  2. Fentanyl 50 mcg.  3. Versed 8 mg IV.   INDICATION:  Rectal bleeding in a 59 year old with vague lower abdominal  pain.   DESCRIPTION OF PROCEDURE:  Procedure described to the patient and consent  obtained.  With the patient in the lateral decubitus position, the Olympus  scope was inserted and advanced.  Prep excellent.  Scattered diverticula in  the sigmoid colon and easily able to pass through the cecum.  Ileocecal  valve and appendiceal orifice seen.  Scope withdrawn.  Cecum, ascending,  transverse, descending, and sigmoid colon were seen well.  No polyps or  other lesions.  Again, scattered diverticula.  Scope withdraw in the rectum.  The rectum and sigmoid in the forward and retroflexed view were normal.  On  the retroflexed view, the internal hemorrhoids were seen.  No other  findings.  The scope was withdrawn, and the patient tolerated the procedure  well.   ASSESSMENT:  Rectal bleeding probably due to internal hemorrhoids.   PLAN:  Routine followup, fiber, hemorrhoid instructions.  Have the patient  follow with Dr. Nadyne Coombes.  Will recommend checking stools for  Hemoccults in 5 years and possibly repeating colonoscopy in 10 years.           ______________________________  Llana Aliment Malon Kindle., M.D.     Waldron Session  D:  12/21/2005  T:  12/21/2005  Job:  [id number]   cc:   Clydie Braun L. Hal Hope, M.D.

## 2010-08-06 NOTE — Cardiovascular Report (Signed)
NAMEMarland Kitchen  MINERVA, BLUETT NO.:  [id number]   MEDICAL RECORD NO.:  [id number]          PATIENT TYPE:  INP   LOCATION:  6524                         FACILITY:  [facility]   PHYSICIAN:  Cristy Hilts. Jacinto Halim, MD       DATE OF BIRTH:  [birth year]   DATE OF PROCEDURE:  04/01/2004  DATE OF DISCHARGE:                              CARDIAC CATHETERIZATION   REFERRING PHYSICIAN:  Dineen Kid. Reche Dixon, M.D.   PROCEDURE PERFORMED:  1.  Left ventriculography.  2.  Selective right and left coronary angiography.  3.  Balloon angioplasty of the candy wrapper restenotic lesion in the distal      circumflex, failed stenting in the same site.   INDICATIONS FOR PROCEDURE:  Ms. Wieland is a 60 year old female with a  history of hypertension and hyperlipidemia who has history of unstable  angina and non-Q wave myocardial infarction status post angioplasty to her  ostial LAD with a 3 by 23 mm Cypher on September 30, 2003, and a 2.5 by 13 mm  Cypher in the second obtuse marginal, and 2.5 by 18 mm Cypher in distal  circumflex on September 30, 2003, after she was admitted with unstable angina,  was readmitted to the hospital yesterday with chest pain suggestive of  unstable angina and she stated that her pain was similar to the anginal pain  that she had prior to her stent implantation.  Due to this, she was brought  to the cardiac catheterization lab to re-evaluate the coronary anatomy.   HEMODYNAMIC DATA:  Left ventricular  pressure 123/15 with end diastolic  pressure of 20 mmHg.  The aortic pressure was 123/77 with a mean of 97 mmHg.  There was no pressure gradient across the aortic valve.   ANGIOGRAPHIC DATA:  Left ventricle:  The left ventricular systolic function was normal with  ejection fraction estimated at 55-60%.  There was no significant mitral  regurgitation.  There was no wall motion abnormality.   Right coronary artery:  The right coronary artery is a nondominant vessel.  It is normal.   Left  main:  The left main is large caliber vessel.  It is normal.   Circumflex:  The circumflex is a large caliber vessel and dominant.  This  gives rise to a large first obtuse marginal which has mild luminal  irregularity and a large second obtuse marginal.  There is a large PDA in  the distal segment.  At the origin of the second obtuse marginal, the  previously placed stent, 2.5 by 13 mm Cypher on September 30, 2003, is widely  patent, however, the stent protrudes at least half or more than halfway into  the main circumflex body at this site and has about 20% eccentric stenosis.  The distal circumflex at the previously placed 2.5 by 18 mm Cypher stent has  a candy wrapper restenosis, the distal edge is 90%, the proximal edge is  70%.  The distal circumflex gives origin to several small PDA branches.   Left anterior descending:  The left anterior descending  is a large vessel.  It gives rise  to a small first diagonal and moderate second diagonal.  At  the ostial origin of the second diagonal there is a 40-50% stenosis which is  unchanged from prior cardiac catheterization on October 09, 2003.  The  previously placed 3 by 23 mm stent on October 09, 2003, is widely patent.   IMPRESSION:  1.  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  Patent first obtuse marginal and LAD stents.  Patent      stent at the distal circumflex, itself.  2.  40-50% lesion in the mid LAD which is unchanged from prior cardiac      catheterization.   DISCUSSION:  Given the coronary anatomy, the hazy lesion into the distal  circumflex appears to be culprit as the LAD lesion remains unchanged.   INTERVENTIONAL DATA:  After the passage of a guide-wire, there was no flow  noted in the distal circumflex indicating the severity of the stenosis,  especially at the distal edge of the stent.   ANGIOPLASTY DATA:  Successful balloon angioplasty with a 2.5 by 20 mm  Voyager balloon  performed at 6 atmospheres pressure for 90 seconds.  Multiple inflations from 4 to 5 atmospheres was performed anywhere from 50  seconds to 1 minute in this site.  The inflow of the stent was also  angioplastied at 6 atmospheres pressure for 90 seconds.  Post balloon  angioplasty results include the distal stenosis reduction of 90% to less  than 20% and the proximal edge stenosis from 70% to less than 10%.  TIMI III  flow was maintained with no evidence of dissection or thrombus.  I was  unable to cross the Cypher stent to the distal circumflex as the second  obtuse marginal stent is protruding into the main body of the circumflex.  Hence, the stenting was abandoned.   RECOMMENDATIONS:  Smoking cessation, continued cardiac risk modification is  indicated.  Will also add Pletal for at least a period of 3-6 months at 30  mg 1/2 tab p.o. b.i.d. to reduce the incidence of restenosis with balloon  angioplasty results.  The patient will be continued on aspirin and Plavix  probably indefinitely.  The patient will be discharged home if stable in the  morning.   TECHNIQUES OF PROCEDURE:  In the usual sterile technique, using right  femoral artery access,  a 6 [place] multipurpose catheter was advanced to the  ascending aorta and a 0.035 inch J-wire.  The catheter was gently advanced  to the left ventricle.  Left ventricular pressure was monitored.  Hand  contrast injection of the left ventricle was performed, both in LAO and RAO  projections.  The catheter was flushed with saline and pulled back into the  ascending aorta and pressure gradient across the aortic valve was monitored.  The right coronary artery was selectively engaged and angiography was  performed.  In a similar fashion, the left main coronary artery was  selectively engaged and angiography was performed.  Then, the catheter was  pulled out of the body in the usual fashion.  TECHNIQUES OF INTERVENTION:  Initially, a 6 [place] guide  was utilized for  planned intervention of the circumflex coronary artery.  However, because of  the presence of protruding stent into the main body of the circumflex in  case of balloon jail, it was decided that it would be easy to proceed with  buddy wiring or use of multiple balloons,  hence it was upgraded to a 7  [place] system.  Then, a 190 cm by 0.014 inch ATW guide-wire was advanced to  cross into the circumflex coronary artery and the tip was placed in the  distal circumflex without any difficulty.  Then, a 2.5 by 20 mm Voyager  balloon was utilized and was advanced through the circumflex coronary artery  easily through the stent struts and multiple inflations were performed.  After performing multiple inflations, a 2.5 by 13 mm Cypher stent was  attempted to advance through the circumflex but because of inability to do  so, the Voyager balloon was readvanced and, again, multiple balloon  inflations at, initially, 4 atmospheres pressure was increased to 6 and  multiple inflations were performed.  Then, the balloon was deflated into the  in flow of the stent and angioplasty at 6 atmospheres of pressure was  performed.  Then, the balloon was pulled into the guiding catheter in the  usual fashion.  Intracoronary nitroglycerin was administered at various  intervals.  Angiography was repeated.  Excellent results were noted.  Because of attempted passage of stent giving a very rough feeling, with a  fear of stent  loss, the procedure of stenting was abandoned and the patient was left alone  with balloon angioplasty results.  Overall, good results were noted.  The  patient was asymptomatic and tolerated the procedure well.  During balloon  angioplasty, no chest pain was reported with balloon inflations.      Kirk Ruths  D:  04/01/2004  T:  04/01/2004  Job:  [id number]   cc:   Dineen Kid. Reche Dixon, M.D.  [address] [place]  [place] [address]  Fax: [phone]

## 2010-08-06 NOTE — Op Note (Signed)
NAME:  Patricia Wall, Patricia Wall NO.:  [id number]   MEDICAL RECORD NO.:  [id number]          PATIENT TYPE:  INP   LOCATION:  5733                         FACILITY:  [facility]   PHYSICIAN:  Velora Heckler, MD      DATE OF BIRTH:  17-Aug-1950   DATE OF PROCEDURE:  06/29/2006  DATE OF DISCHARGE:                               OPERATIVE REPORT   PREOPERATIVE DIAGNOSES:  1. Prolapsed hemorrhoids.  2. Nevus left medial buttock.   POSTOPERATIVE DIAGNOSES:  1. Prolapsed hemorrhoids.  2. Nevus left medial buttock.   PROCEDURE:  1. Open hemorrhoidectomy (posterior column).  2. Excise the nevus (less than 1 cm) left medial buttock.   SURGEON:  Velora Heckler, MD, FACS   ANESTHESIA:  General.   ESTIMATED BLOOD LOSS:  Minimal.   PREPARATION:  Betadine.   COMPLICATIONS:  None.   INDICATIONS:  The patient is a 60 year old white female referred from  [facility] with painful bleeding hemorrhoids.  These have been  present for a number of years.  She complains of anorectal pain.  She  has frequent bleeding with bowel movements.  On examination by Dr. Felizardo Hoffmann, Dr. Carman Ching, and myself, the patient has significant  hemorrhoids mainly in the posterior column.  She underwent a full  colonoscopy by Dr. Randa Evens without significant other finding of other  than hemorrhoids.  She now comes to surgery for excision.  The patient  also notes a small raised nevus at the medial left buttock on the  perineum which she desires to be excised concurrently.   BODY OF REPORT:  Procedure is done in OR #16 at [facility].  The patient is brought to the operating room, placed in  supine position on the operating room table.  Following administration  of general anesthesia the patient is positioned in lithotomy and prepped  and draped in usual strict aseptic fashion.  After ascertaining that an  adequate level of anesthesia been obtained, the skin beneath the  nevus  on the left medial buttock is anesthetized with local anesthetic.  Using  a #15 blade.  A small elliptical incision was made so as to encompass  the entire base of the nevus and excised it completely.  Hemostasis was  obtained with electrocautery.  Skin was reapproximated with interrupted  4-0 Monocryl subcuticular suture.   Next we turned our attention to the anorectum.  Digital rectal exam was  performed.  The anus was slightly dilated.  No palpable masses were  identified.  There is essentially mild circumferential disease.  There  is significant prolapse with superficial ulceration and friability in  the posterior midline.  The prolapse segment of the hemorrhoidal tissue  is grasped with an Allis clamp.  Subcutaneous tissues and submucosal  plane is infiltrated with Marcaine with epinephrine local anesthesia.  A  3-0 chromic gut suture is placed at the apex of the hemorrhoidal column  above the dentate line and tied securely.  Using the electrocautery, the  skin is incised and an incision was made on either side of  the Allis  clamp.  Skin was raised.  Anal sphincter muscle was identified and  preserved.  The plane is developed submucosally.  Engorged hemorrhoidal  vessels are excised using the electrocautery for hemostasis.  Dissection  was carried up to the level of the chromic gut suture and the entire  column is excised in its entirety and submitted to pathology for review.  Mucosal edges were then reapproximated with a running 3-0 chromic gut  locking suture out to the perineum.  Local anesthetic is infiltrated.  Good hemostasis was noted.  Gelfoam was placed in the anal canal.  Dry  gauze dressings were placed.  The patient was taken out a lithotomy and  awakened from anesthesia.  She is brought to the recovery room in stable  condition.  The patient tolerated the procedure well.      Velora Heckler, MD  Electronically Signed     TMG/MEDQ  D:  06/29/2006  T:   06/29/2006  Job:  [id number]   cc:   Fayrene Fearing L. Malon Kindle., M.D.  Marcene Duos, M.D.  Cristy Hilts. Jacinto Halim, MD

## 2010-08-06 NOTE — Discharge Summary (Signed)
NAMEMarland Wall  EVERLIE, EBLE NO.:  [id number]   MEDICAL RECORD NO.:  [id number]          PATIENT TYPE:  INP   LOCATION:  5733                         FACILITY:  [facility]   PHYSICIAN:  Velora Heckler, MD      DATE OF BIRTH:  16-Dec-1950   DATE OF ADMISSION:  06/27/2006  DATE OF DISCHARGE:  06/30/2006                               DISCHARGE SUMMARY   REASON FOR ADMISSION:  Prolapsed hemorrhoids.  Coronary artery disease  with stent, need for anticoagulation.   HISTORY OF PRESENT ILLNESS:  Patricia Wall is a 60 year old white female  referred by [facility] with prolapsed hemorrhoids with bleeding and  pain.  The patient has significant coronary artery disease and has a  previous stent placement.  She is on Plavix and aspirin.  In order to  discontinue these medications, she required Lovenox administration  perioperatively.  She was admitted April 8 and started on Lovenox with  discontinuation of Plavix and aspirin.   HOSPITAL COURSE:  The patient received Lovenox twice daily at  therapeutic doses per pharmacy protocol.  She was prepared and taken to  the operating room on April the 10th.  She underwent open  hemorrhoidectomy and excision of a nevus from the left medial buttock.  The procedure was uncomplicated.  She was seen perioperatively by her  cardiologist.  Postoperatively, she did well with re-initiation of  Plavix and aspirin and continuous coverage with Lovenox.  She is  prepared for discharge home on the first postoperative day.   DISCHARGE PLANNING:  The patient is discharged home, June 30, 2006, in  good condition, tolerating a regular diet, and ambulating independently.  The patient will continue on Plavix and aspirin.  Lovenox was  administered on the morning of discharge.  It will now be discontinued.  The patient will use baby wipes.  She will remain on stool softeners for  one month.  She will follow up with me in my office in 3-4 weeks.   FINAL  DIAGNOSES:  1. Prolapsed hemorrhoids with bleeding.  2. Nevus, left medial buttock.  3. Coronary artery disease.   CONDITION AT DISCHARGE:  Good.      Velora Heckler, MD  Electronically Signed     TMG/MEDQ  D:  06/30/2006  T:  06/30/2006  Job:  [id number]   cc:   Cristy Hilts. Jacinto Halim, MD  Llana Aliment Malon Kindle., M.D.  Marcene Duos, M.D.

## 2010-08-06 NOTE — H&P (Signed)
NAME:  Patricia Wall, Patricia Wall NO.:  [id number]   MEDICAL RECORD NO.:  [id number]                   PATIENT TYPE:  EMS   LOCATION:  [facility]                                 FACILITY:  [facility]   PHYSICIAN:  Quita Skye. Waldon Reining, MD             DATE OF BIRTH:  11/09/1950   DATE OF ADMISSION:  10/09/2003  DATE OF DISCHARGE:                                HISTORY & PHYSICAL   Maribell Demeo is a 60 year old white woman who is admitted to [facility] for unstable angina.  She was just discharged one week ago.   The patient presented last week with unstable angina.  She underwent cardiac  catheterization.  This demonstrated severe three vessel coronary artery  disease.  Though coronary artery bypass surgery was offered to the patient,  she chose to undergo staged percutaneous coronary intervention.  She first  received a stent in the circumflex as it was felt the culprit vessel with an  ulcerated plaque.  She then received a stent in the second obtuse marginal.  Attempt to intervene on the LAD was unsuccessful.  The decision was made to  discharge the patient for medical treatment and for a future outpatient  Cardiolite study to guide further management.  It was felt that another  intervention on the LAD could be considered.   The patient presented to the emergency department this evening with chest  pain which began this morning.  It has continued, waxing and waning,  throughout the course of the day.  Chest discomfort is described as a  substernal pressure.  It radiates to the left arm.  It is associated with  nausea but no dyspnea, diaphoresis, __________.  Three nitroglycerin tablets  taken at home appeared not to extract her pain.  She continues to experience  chest pain at this time, though it is somewhat improved presenting to the  emergency department.   The patient has no history of myocardial infarction, congestive heart  failure, or __________ .   In  addition to the coronary disease described above, the patient has a  history of hypertension and dyslipidemia.  She has continued to smoke even  since her hospitalization last week.  There is a strong family history of  coronary artery disease.  The patient has no history of diabetes mellitus.   MEDICATIONS:  1. Metoprolol 25 mg p.o. b.i.d.  2. Niacin 500 mg p.o. h.s.  3. Lipitor 40 mg p.o. h.s.  4. Plavix 75 mg p.o. daily.  5. Aspirin 81 mg p.o. daily.  6. Altace 2.5 mg p.o. daily.  7. Protonix 40 mg p.o. daily.  8. Nitroglycerin p.r.n.   MEDICATION ALLERGIES:  NONE.   PREVIOUS OPERATIONS:  1. Appendectomy.  2. Bunionectomy.  3. Hysterectomy.   SIGNIFICANT INJURIES:  None.   FAMILY HISTORY:  Significant for coronary artery disease as described above.   SOCIAL HISTORY:  The patient lives alone.  She works as a Electrical engineer for  [employer].   REVIEW OF SYSTEMS:  Reveals no new problems related to her head, eyes, ears,  nose, mouth, throat, lungs, gastrointestinal system, genitourinary system,  or extremities.  There is no history of neurologic or psychiatric disorder.  There is no history of fever, chills, or weight loss.   PHYSICAL EXAMINATION:  VITAL SIGNS:  Blood pressure 103/60, pulse 82 and  regular, respirations 26, temperature 98.1.  GENERAL:  The patient was a middle-aged white woman in mild discomfort.  She  was alert, oriented, appropriate, and responsive.  HEAD/EYES/NOSE/MOUTH:  Normal.  NECK:  Without thyromegaly or adenopathy.  Carotid pulses were palpable  bilaterally.  CARDIAC:  Examination revealed a normal S1 and S2.  There was no S3, S4,  murmur, rub, or click.  Cardiac rhythm was regular.  CHEST:  No chest wall tenderness was noted.  LUNGS:  Clear.  ABDOMEN:  Soft and nontender.  There was no mass, hepatosplenomegaly, bruit,  distention, rebound, guarding, or rigidity.  Bowel sounds were normal.  BREASTS/PELVIC/RECTAL:  Examinations were not  performed as they were not  pertinent to the reason for acute care hospitalization.  EXTREMITIES:  Without edema, deviation, or deformity.  Radial and dorsalis  pedal pulses were palpable bilaterally.  NEUROLOGIC:  Brief screening neurologic survey was unremarkable.   LABORATORY DATA:  Electrocardiogram was normal.  Rhythm was sinus.  Chest  radiograph was pending at the time of this dictation.  The initial set of  cardiac markers revealed a CK-MB 1.1, myoglobin 52.5, troponin less than  0.05.  Potassium 3.7, BUN 16, and creatinine 0.6.  The remaining studies  were pending at the time of this dictation.   IMPRESSION:  1. Unstable angina.  2. Coronary artery disease.  Severe three vessel coronary artery disease by     cardiac catheterization last week.  Staged percutaneous coronary     intervention with a circumflex stent successful, second obtuse marginal     stent successful, and then unsuccessful left anterior descending     intervention.  3. Hypertension.  4. Dyslipidemia.   PLAN:  1. Cardiac Stepdown Unit.  2. Serial cardiac enzymes.  3. Aspirin.  4. Intravenous heparin.  5. Intravenous Integrilin.  6. Plavix held (possible need for coronary artery bypass surgery).  7. Metoprolol.  8. Intravenous nitroglycerin.  9. Discontinue smoking, discussed with the patient.  10.      Further measures per Dr. Jacinto Halim.  11.      Emergent cardiac catheterization if no relief with above measures.   The patient's daughter was present throughout the patient encounter.                                                Quita Skye. Waldon Reining, MD    MSC/MEDQ  D:  10/09/2003  T:  10/09/2003  Job:  [id number]   cc:   Cristy Hilts. Jacinto Halim, M.D.  [address], [address]  [place]  [place] [address]  Fax: [phone]

## 2010-10-25 ENCOUNTER — Emergency Department (HOSPITAL_COMMUNITY)
Admission: EM | Admit: 2010-10-25 | Discharge: 2010-10-25 | Discharge status: Against Medical Advice | Payer: Medicaid Other | Attending: Emergency Medicine | Admitting: Emergency Medicine

## 2010-12-10 LAB — LIPID PANEL
Cholesterol: 143
LDL Cholesterol: 75

## 2010-12-21 LAB — CREATININE, SERUM
Creatinine, Ser: 0.7
GFR calc Af Amer: >60

## 2011-09-01 ENCOUNTER — Encounter: Payer: Self-pay | Admitting: Cardiovascular Disease

## 2011-09-01 ENCOUNTER — Ambulatory Visit (INDEPENDENT_AMBULATORY_CARE_PROVIDER_SITE_OTHER): Payer: Medicaid Other | Admitting: Cardiovascular Disease

## 2011-09-01 DIAGNOSIS — I2510 Atherosclerotic heart disease of native coronary artery without angina pectoris: Secondary | ICD-10-CM

## 2011-09-01 DIAGNOSIS — R930 Abnormal findings on diagnostic imaging of skull and head, not elsewhere classified: Secondary | ICD-10-CM

## 2011-09-01 DIAGNOSIS — E785 Hyperlipidemia, unspecified: Secondary | ICD-10-CM

## 2011-09-01 DIAGNOSIS — F17200 Nicotine dependence, unspecified, uncomplicated: Secondary | ICD-10-CM

## 2011-09-01 NOTE — Assessment & Plan Note (Signed)
Counseled for less than 10 minutes. Try E-cig.  F/u with primary

## 2011-09-01 NOTE — Patient Instructions (Signed)
Your physician wants you to follow-up in:  6 MONTHS WITH DR Haywood Filler will receive a reminder letter Your physician recommends that you continue on your current medications as directed. Please refer to the Current Medication list given to you today.

## 2011-09-01 NOTE — Assessment & Plan Note (Signed)
Recent CXR before GB surgery fine per patient.  Previous LUL lesion not cancer.  F/U primary

## 2011-09-01 NOTE — Assessment & Plan Note (Signed)
Stable with no angina and good activity level.  Continue medical Rx  

## 2011-09-01 NOTE — Assessment & Plan Note (Signed)
Cholesterol is at goal.  Continue current dose of statin and diet Rx.  No myalgias or side effects.  F/U  LFT's in 6 months. Lab Results  Component Value Date   LDLCALC  Value: 63        Total Cholesterol/HDL:CHD Risk Coronary Heart Disease Risk Table                     Men   Women  1/2 Average Risk   3.4   3.3  Average Risk       5.0   4.4  2 X Average Risk   9.6   7.1  3 X Average Risk  23.4   11.0        Use the calculated Patient Ratio above and the CHD Risk Table to determine the patient's CHD Risk.        ATP III CLASSIFICATION (LDL):  <100     mg/dL   Optimal  100-129  mg/dL   Near or Above                    Optimal  130-159  mg/dL   Borderline  160-189  mg/dL   High  >190     mg/dL   Very High 08/29/2009             

## 2011-09-01 NOTE — Progress Notes (Signed)
Patient ID: Patricia Wall, female   DOB: July 17, 1950, 61 y.o.   MRN: [id number] F/U for CAD. Recent hospitalization cared for by Dr Antoine Poche. I have not seen since 2009 Previous patient of [facility]. Previoius stent to OM and LAD. Status post Cypher stent to the LAD in 2005. Status post Cypher stent to the distal circumflex and OM2 branch in 2005 with balloon angioplasty for restenosis in the circumflex in 2006. Last cath by Dr Juanda Chance 6/11 with patent stents and no critical stenosis. Abnormal CXR with "flame" like lesion in left lung. Had CT 2011 to assess and R/O bronchogenic CA. 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 No angina . Denies hemoptysis, cough, sputum. . Not postural and no history of vertebrobasalar insuff. Has had an MRI and carotids recently due to chronic dizziness and she indicates they were ok. Compliant with meds. Counseled on smoking cessation. Failed Chantix  And Zyban in past.  Smokes 5 per day.  Will try e-cig.  Has ulcers in mouth with gum/lozengers  Recent hospitalization in [place] for lap choly.  No cardiac complications  ROS: Denies fever, malais, weight loss, blurry vision, decreased visual acuity, cough, sputum, SOB, hemoptysis, pleuritic pain, palpitaitons, heartburn, abdominal pain, melena, lower extremity edema, claudication, or rash.  All other systems reviewed and negative  General: Affect appropriate Chronically ill COPDer HEENT: normal Neck supple with no adenopathy JVP normal no bruits no thyromegaly Lungs clear with no wheezing and good diaphragmatic motion Heart:  S1/S2 no murmur, no rub, gallop or click PMI normal Abdomen: benighn, BS positve, no tenderness, no AAA Recent lap choly no bruit.  No HSM or HJR Distal pulses intact with no bruits No edema Neuro non-focal Skin warm and dry No muscular weakness   Current Outpatient Prescriptions  Medication Sig Dispense Refill  . albuterol (VENTOLIN HFA) 108 (90 BASE) MCG/ACT inhaler Inhale 2 puffs into  the lungs every 6 (six) hours as needed.        [name] aspirin 81 MG tablet Take 81 mg by mouth daily.        . beclomethasone (QVAR) 80 MCG/ACT inhaler Inhale 1 puff into the lungs as needed.        . calcium carbonate (OS-CAL) 600 MG TABS Take 600 mg by mouth daily.        . clonazePAM (KLONOPIN) 1 MG tablet Take 1 mg by mouth 2 (two) times daily as needed.        . clopidogrel (PLAVIX) 75 MG tablet Take 75 mg by mouth daily.        . cyclobenzaprine (FLEXERIL) 10 MG tablet Take 10 mg by mouth 2 (two) times daily as needed.        . DULoxetine (CYMBALTA) 30 MG capsule Take 30 mg by mouth 3 (three) times daily.        . metoprolol succinate (TOPROL-XL) 25 MG 24 hr tablet Take 25 mg by mouth daily.        . niacin (NIASPAN) 1000 MG CR tablet Take 1,000 mg by mouth at bedtime.        . nitroGLYCERIN (NITROSTAT) 0.4 MG SL tablet Place 0.4 mg under the tongue every 5 (five) minutes as needed.        . salmeterol (SEREVENT DISKUS) 50 MCG/DOSE diskus inhaler Inhale 1 puff into the lungs 2 (two) times daily.        [name] tiotropium (SPIRIVA HANDIHALER) 18 MCG inhalation capsule Place 18 mcg into inhaler and inhale daily.        [name]  traMADol (ULTRAM) 50 MG tablet Take 50 mg by mouth every 6 (six) hours as needed.          Allergies  Review of patient's allergies indicates no known allergies.  Electrocardiogram:  NSR rate 90 low voltage from body habitus Normal ECG  Assessment and Plan

## 2011-09-06 NOTE — Addendum Note (Signed)
Addended by: Reine Just on: 09/06/2011 04:57 PM   Modules accepted: Orders

## 2012-01-10 ENCOUNTER — Other Ambulatory Visit: Payer: Self-pay | Admitting: Gastroenterology

## 2012-01-11 ENCOUNTER — Other Ambulatory Visit: Payer: Self-pay | Admitting: Gastroenterology

## 2012-01-11 DIAGNOSIS — R112 Nausea with vomiting, unspecified: Secondary | ICD-10-CM

## 2012-01-13 ENCOUNTER — Ambulatory Visit
Admission: RE | Admit: 2012-01-13 | Discharge: 2012-01-13 | Disposition: A | Discharge status: Home / Self Care | Payer: Medicaid Other | Source: Ambulatory Visit | Attending: Gastroenterology | Admitting: Gastroenterology

## 2012-01-13 DIAGNOSIS — R112 Nausea with vomiting, unspecified: Secondary | ICD-10-CM

## 2012-01-13 MED ORDER — IOHEXOL 300 MG/ML  SOLN
100.0000 mL | Freq: Once | INTRAMUSCULAR | Status: AC | PRN
  Administered 2012-01-13: 100 mL via INTRAVENOUS

## 2012-09-07 ENCOUNTER — Ambulatory Visit: Payer: Medicaid Other | Admitting: Cardiovascular Disease

## 2012-11-15 ENCOUNTER — Encounter: Payer: Self-pay | Admitting: Cardiovascular Disease

## 2012-11-15 ENCOUNTER — Ambulatory Visit (INDEPENDENT_AMBULATORY_CARE_PROVIDER_SITE_OTHER): Payer: Medicaid Other | Admitting: Cardiovascular Disease

## 2012-11-15 VITALS — BP 138/73 | HR 101 | Ht 66.0 in | Wt 190.0 lb

## 2012-11-15 DIAGNOSIS — J449 Chronic obstructive pulmonary disease, unspecified: Secondary | ICD-10-CM

## 2012-11-15 DIAGNOSIS — R062 Wheezing: Secondary | ICD-10-CM

## 2012-11-15 DIAGNOSIS — I2510 Atherosclerotic heart disease of native coronary artery without angina pectoris: Secondary | ICD-10-CM

## 2012-11-15 DIAGNOSIS — R0989 Other specified symptoms and signs involving the circulatory and respiratory systems: Secondary | ICD-10-CM

## 2012-11-15 DIAGNOSIS — E785 Hyperlipidemia, unspecified: Secondary | ICD-10-CM

## 2012-11-15 DIAGNOSIS — I10 Essential (primary) hypertension: Secondary | ICD-10-CM

## 2012-11-15 NOTE — Assessment & Plan Note (Signed)
Smoker with known vascular disease.  F/U duplex ASA

## 2012-11-15 NOTE — Progress Notes (Signed)
Patient ID: Patricia Wall, female   DOB: 01-Jan-1951, 62 y.o.   MRN: [id number] F/U for CAD. Recent hospitalization cared for by Dr Antoine Poche. I have not seen since 2009 Previous patient of [facility]. Previoius stent to OM and LAD. Status post Cypher stent to the LAD in 2005. Status post Cypher stent to the distal circumflex and OM2 branch in 2005 with balloon angioplasty for restenosis in the circumflex in 2006. Last cath by Dr Juanda Chance 6/11 with patent stents and no critical stenosis. Abnormal CXR with "flame" like lesion in left lung. Had CT 2011 to assess and R/O bronchogenic CA. 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 No angina . Denies hemoptysis, cough, sputum. . Not postural and no history of vertebrobasalar insuff.  She is not sure of her coreg dose  Finally quit smoking about 2 months ago  Lap choly last year.  No cardiac complications  ROS: Denies fever, malais, weight loss, blurry vision, decreased visual acuity, cough, sputum, SOB, hemoptysis, pleuritic pain, palpitaitons, heartburn, abdominal pain, melena, lower extremity edema, claudication, or rash.  All other systems reviewed and negative  General: Affect appropriate Looks older than stated age HEENT: normal Neck supple with no adenopathy JVP normal right  bruits no thyromegaly Lungs clear with no wheezing and good diaphragmatic motion Heart:  S1/S2 no murmur, no rub, gallop or click PMI normal Abdomen: benighn, BS positve, no tenderness, no AAA no bruit.  No HSM or HJR Distal pulses intact with no bruits No edema Neuro non-focal Skin warm and dry No muscular weakness   Current Outpatient Prescriptions  Medication Sig Dispense Refill  . albuterol (VENTOLIN HFA) 108 (90 BASE) MCG/ACT inhaler Inhale 2 puffs into the lungs every 6 (six) hours as needed.        [name] aspirin 81 MG tablet Take 81 mg by mouth daily.        . beclomethasone (QVAR) 80 MCG/ACT inhaler Inhale 1 puff into the lungs as needed.        . calcium carbonate  (OS-CAL) 600 MG TABS Take 600 mg by mouth daily.        [name] CARVEDILOL PO Take 1 tablet by mouth 2 (two) times daily.      . cholecalciferol (VITAMIN D) 400 UNITS TABS Take by mouth.      . clonazePAM (KLONOPIN) 1 MG tablet Take 1 mg by mouth 2 (two) times daily as needed.        . clopidogrel (PLAVIX) 75 MG tablet Take 75 mg by mouth daily.        . cyclobenzaprine (FLEXERIL) 10 MG tablet Take 10 mg by mouth 2 (two) times daily as needed.        . DULoxetine (CYMBALTA) 60 MG capsule Take 60 mg by mouth 2 (two) times daily.      . Hydrocodone-Acetaminophen 5-300 MG TABS Take by mouth.      . niacin (NIASPAN) 1000 MG CR tablet Take 1,000 mg by mouth at bedtime.        . nitroGLYCERIN (NITROSTAT) 0.4 MG SL tablet Place 0.4 mg under the tongue every 5 (five) minutes as needed.        [name] omeprazole (PRILOSEC) 40 MG capsule Take 40 mg by mouth daily.      . salmeterol (SEREVENT DISKUS) 50 MCG/DOSE diskus inhaler Inhale 1 puff into the lungs 2 (two) times daily.        . simvastatin (ZOCOR) 20 MG tablet Take 20 mg by mouth every evening.      [name]  tiotropium (SPIRIVA HANDIHALER) 18 MCG inhalation capsule Place 18 mcg into inhaler and inhale daily.         No current facility-administered medications for this visit.    Allergies  Review of patient's allergies indicates no known allergies.  Electrocardiogram:  SR rate 101 low voltage   Assessment and Plan

## 2012-11-15 NOTE — Assessment & Plan Note (Signed)
Cholesterol is at goal.  Continue current dose of statin and diet Rx.  No myalgias or side effects.  F/U  LFT's in 6 months. Lab Results  Component Value Date   LDLCALC  Value: 63        Total Cholesterol/HDL:CHD Risk Coronary Heart Disease Risk Table                     Men   Women  1/2 Average Risk   3.4   3.3  Average Risk       5.0   4.4  2 X Average Risk   9.6   7.1  3 X Average Risk  23.4   11.0        Use the calculated Patient Ratio above and the CHD Risk Table to determine the patient's CHD Risk.        ATP III CLASSIFICATION (LDL):  <100     mg/dL   Optimal  161-096  mg/dL   Near or Above                    Optimal  130-159  mg/dL   Borderline  045-409  mg/dL   High  >811     mg/dL   Very High 12/03/7827

## 2012-11-15 NOTE — Assessment & Plan Note (Signed)
Would like her to be less tachycardic.  Will check PFTls pre and post bronchodilator She describes wheezing with use of inhalers but just quit smoking and not wheezing on exam May increase coreg if these are ok

## 2012-11-15 NOTE — Assessment & Plan Note (Signed)
Well controlled.  Continue current medications and low sodium Dash type diet.    

## 2012-11-15 NOTE — Patient Instructions (Signed)
Your physician wants you to follow-up in:   6 MONTHS WITH DR Haywood Filler will receive a reminder letter in the mail two months in advance. If you don't receive a letter, please call our office to schedule the follow-up appointment. Your physician recommends that you continue on your current medications as directed. Please refer to the Current Medication list given to you today. Your physician has requested that you have a carotid duplex. This test is an ultrasound of the carotid arteries in your neck. It looks at blood flow through these arteries that supply the brain with blood. Allow one hour for this exam. There are no restrictions or special instructions.  Your physician has recommended that you have a pulmonary function test. Pulmonary Function Tests are a group of tests that measure how well air moves in and out of your lungs.

## 2012-11-21 ENCOUNTER — Encounter (INDEPENDENT_AMBULATORY_CARE_PROVIDER_SITE_OTHER): Payer: Medicaid Other

## 2012-11-21 DIAGNOSIS — I6529 Occlusion and stenosis of unspecified carotid artery: Secondary | ICD-10-CM

## 2012-11-21 DIAGNOSIS — R0989 Other specified symptoms and signs involving the circulatory and respiratory systems: Secondary | ICD-10-CM

## 2012-11-21 DIAGNOSIS — R55 Syncope and collapse: Secondary | ICD-10-CM

## 2012-12-12 ENCOUNTER — Ambulatory Visit (INDEPENDENT_AMBULATORY_CARE_PROVIDER_SITE_OTHER): Payer: Medicaid Other | Admitting: Internal Medicine

## 2012-12-12 DIAGNOSIS — J449 Chronic obstructive pulmonary disease, unspecified: Secondary | ICD-10-CM

## 2012-12-12 DIAGNOSIS — R062 Wheezing: Secondary | ICD-10-CM

## 2012-12-12 LAB — PULMONARY FUNCTION TEST

## 2012-12-12 NOTE — Progress Notes (Signed)
PFT done today. 

## 2012-12-20 ENCOUNTER — Telehealth: Payer: Self-pay | Admitting: Cardiovascular Disease

## 2012-12-20 NOTE — Telephone Encounter (Signed)
PT  CALLING WANTING  PFT  RESULTS  TEST  RESULTS  NOT AVAILABLE AT THIS TIME  WILL CALL PT  ONCE  RESULTS  RECEIVED./CY

## 2012-12-20 NOTE — Telephone Encounter (Signed)
New problem  Pt calling to follow up on the PV test/ Please advise.

## 2012-12-26 ENCOUNTER — Telehealth: Payer: Self-pay | Admitting: Internal Medicine

## 2012-12-26 NOTE — Telephone Encounter (Signed)
Pt returned called. She reports Dr. Eden Emms advised her they never received her PFT results. I advised her will refax them to there office but will need to call them for results. Nothing further needed

## 2012-12-26 NOTE — Telephone Encounter (Signed)
Pt has not been seen by [place]. She needs to call the ordering physician for the results. Dr. Maple Hudson only reads the test ordered by outside physicians but we don't give results to pt by phone.

## 2012-12-26 NOTE — Telephone Encounter (Signed)
New Problem  Pt calling requesting test results from ECHO PV test. Please call

## 2012-12-26 NOTE — Telephone Encounter (Signed)
PT  CALLING FOR PFT  RESULTS NOT  ECHO PV   ONCE AGAIN  PT MADE AWARE  HAVE NOT RECEIVED  RESULTS  AT THIS  TIME INSTRUCTED  PT   TO  CALL PULMONARY  AND  SEE  IF  THEY HAVE  RESULTS  AVAILABLE .Zack Seal

## 2012-12-31 ENCOUNTER — Telehealth: Payer: Self-pay | Admitting: Cardiovascular Disease

## 2012-12-31 DIAGNOSIS — J449 Chronic obstructive pulmonary disease, unspecified: Secondary | ICD-10-CM

## 2012-12-31 NOTE — Telephone Encounter (Signed)
New problem   Pt stated she was returning a call from nurse. Please call her before 1:30 or after 2:30 today.

## 2012-12-31 NOTE — Telephone Encounter (Signed)
Pt  Aware  And copy  Mailed  To  Pt's  Daughter at pt's  Request  .Patricia Wall

## 2013-01-08 ENCOUNTER — Other Ambulatory Visit: Payer: Self-pay | Admitting: *Deleted

## 2013-01-08 DIAGNOSIS — J449 Chronic obstructive pulmonary disease, unspecified: Secondary | ICD-10-CM

## 2013-01-10 ENCOUNTER — Institutional Professional Consult (permissible substitution): Payer: Medicaid Other | Admitting: Internal Medicine

## 2013-01-16 ENCOUNTER — Encounter: Payer: Self-pay | Admitting: Internal Medicine

## 2013-01-16 ENCOUNTER — Ambulatory Visit (INDEPENDENT_AMBULATORY_CARE_PROVIDER_SITE_OTHER)
Admission: RE | Admit: 2013-01-16 | Discharge: 2013-01-16 | Disposition: A | Discharge status: Home / Self Care | Payer: Medicaid Other | Source: Ambulatory Visit | Attending: Internal Medicine | Admitting: Internal Medicine

## 2013-01-16 ENCOUNTER — Ambulatory Visit (INDEPENDENT_AMBULATORY_CARE_PROVIDER_SITE_OTHER): Payer: Medicaid Other | Admitting: Internal Medicine

## 2013-01-16 VITALS — BP 128/76 | HR 102 | Temp 98.3°F | Ht 66.0 in | Wt 192.2 lb

## 2013-01-16 DIAGNOSIS — IMO0001 Reserved for inherently not codable concepts without codable children: Secondary | ICD-10-CM

## 2013-01-16 DIAGNOSIS — J449 Chronic obstructive pulmonary disease, unspecified: Secondary | ICD-10-CM

## 2013-01-16 MED ORDER — UMECLIDINIUM-VILANTEROL 62.5-25 MCG/INH IN AEPB: 2.0000 | INHALATION_SPRAY | Freq: Once | RESPIRATORY_TRACT | Status: DC

## 2013-01-16 NOTE — Progress Notes (Signed)
Subjective:     Patient ID: Patricia Wall, female   DOB: 1950/10/26, 62 y.o.   MRN: [id number]  HPI    8 yowf quit smoking 09/2012 with onset of sob x 2004 referred 01/16/2013 to pulmonary clinic by Dr Eden Emms with abn pfts c/w GOLD II COPD   01/16/2013 1st [facility] office visit/ Jaquay Posthumus cc progressive worse doe x 10 year to point where can't walk the dog more than 5 min, uses handicap parking and leans on cart at [employer],  no change since quit smoking 3 m prior to OV   Not really much better on  ventolin / qvar/ spiriva/ serevent and no tendency to aecopd to date.   No obvious day to day or daytime variabilty or assoc chronic cough or cp or chest tightness, subjective wheeze overt sinus or hb symptoms. No unusual exp hx or h/o childhood pna/ asthma or knowledge of premature birth.  Sleeping ok without nocturnal  or early am exacerbation  of respiratory  c/o's or need for noct saba. Also denies any obvious fluctuation of symptoms with weather or environmental changes or other aggravating or alleviating factors except as outlined above   Current Medications, Allergies, Complete Past Medical History, Past Surgical History, Family History, and Social History were reviewed in [employer] record.    ROS  The following are not active complaints unless bolded sore throat, dysphagia, dental problems, itching, sneezing,  nasal congestion or excess/ purulent secretions, ear ache,   fever, chills, sweats, unintended wt loss, pleuritic or exertional cp, hemoptysis,  orthopnea pnd or leg swelling, presyncope, palpitations, heartburn, abdominal pain, anorexia, nausea, vomiting, diarrhea  or change in bowel or urinary habits, change in stools or urine, dysuria,hematuria,  rash, arthralgias, visual complaints, headache, numbness weakness or ataxia or problems with walking or coordination,  change in mood/affect or memory.               Review of Systems     Objective:   Physical Exam amb wf nad  Wt Readings from Last 3 Encounters:  01/16/13 192 lb 3.2 oz (87.181 kg)  11/15/12 190 lb (86.183 kg)  07/06/10 201 lb 1.9 oz (91.227 kg)      HEENT mild turbinate edema.  Oropharynx no thrush or excess pnd or cobblestoning.  No JVD or cervical adenopathy. Mild accessory muscle hypertrophy. Trachea midline, nl thryroid. Chest was hyperinflated by percussion with diminished breath sounds and moderate increased exp time without wheeze. Hoover sign positive at mid inspiration. Regular rate and rhythm without murmur gallop or rub or increase P2 or edema.  Abd: no hsm, nl excursion. Ext warm without cyanosis or clubbing.    CXR  01/16/2013 :     Hyperinflation. No active cardiopulmonary disease.  Assessment:

## 2013-01-16 NOTE — Assessment & Plan Note (Addendum)
-   pft's 12/12/12 FEV1  1.41 (53%) ratio 54 with no significant reversibility and DLCO 47% corrects to 50 01/16/2013  Walked RA x 2 laps @ 185 ft each stopped due to sob, no desat     She appears to predominantly have copd/e with dynamic hyerinflation / doe s tendency to aecopd (a group C pattern)  so best option is anoro trial and simplify rx to see what difference if any this makes.  Re-inforced importance of maintaining smoking cessation in this setting.

## 2013-01-16 NOTE — Patient Instructions (Addendum)
Prilosec Take 30-60 min before first meal of the day   Stop all inhalers except ventolin and use it up to 2 puffs every 4 hours if can't catch your breath  Anoro open once and take two dep drags each am as soon as you get up each day  Please remember to go to the   x-ray department downstairs for your tests - we will call you with the results when they are available.    Please schedule a follow up office visit in 4 weeks, sooner if needed

## 2013-01-17 NOTE — Progress Notes (Signed)
Quick Note:  Advised pt of cxr results per MW. Pt verbalized understanding and has no further questions at this time ______ 

## 2013-02-13 ENCOUNTER — Ambulatory Visit: Payer: Medicaid Other | Admitting: Internal Medicine

## 2013-02-19 ENCOUNTER — Encounter: Payer: Self-pay | Admitting: Internal Medicine

## 2013-02-19 ENCOUNTER — Ambulatory Visit (INDEPENDENT_AMBULATORY_CARE_PROVIDER_SITE_OTHER): Payer: Medicaid Other | Admitting: Internal Medicine

## 2013-02-19 VITALS — BP 106/62 | HR 100 | Temp 98.1°F | Ht 66.0 in | Wt 195.0 lb

## 2013-02-19 DIAGNOSIS — I10 Essential (primary) hypertension: Secondary | ICD-10-CM

## 2013-02-19 DIAGNOSIS — IMO0001 Reserved for inherently not codable concepts without codable children: Secondary | ICD-10-CM

## 2013-02-19 DIAGNOSIS — J449 Chronic obstructive pulmonary disease, unspecified: Secondary | ICD-10-CM

## 2013-02-19 MED ORDER — NEBIVOLOL HCL 10 MG PO TABS: 10.0000 mg | ORAL_TABLET | Freq: Every day | ORAL | Status: DC

## 2013-02-19 MED ORDER — PREDNISONE (PAK) 10 MG PO TABS: ORAL_TABLET | ORAL | Status: DC

## 2013-02-19 NOTE — Assessment & Plan Note (Signed)
-   pft's 12/12/12 FEV1  1.41 (53%) ratio 54 with no significant reversibility and DLCO 47% corrects to 50 01/16/2013  Walked RA x 2 laps @ 185 ft each stopped due to sob, no desat    Symptoms are markedly disproportionate to objective findings and not clear this is a lung problem but pt does appear to have difficult airway management issues. DDX of  difficult airways managment all start with A and  include Adherence, Ace Inhibitors, Acid Reflux, Active Sinus Disease, Alpha 1 Antitripsin deficiency, Anxiety masquerading as Airways dz,  ABPA,  allergy(esp in young), Aspiration (esp in elderly), Adverse effects of DPI,  Active smokers, plus two Bs  = Bronchiectasis and Beta blocker use..and one C= CHF   Adherence is always the initial "prime suspect" and is a multilayered concern that requires a "trust but verify" approach in every patient - starting with knowing how to use medications, especially inhalers, correctly, keeping up with refills and understanding the fundamental difference between maintenance and prns vs those medications only taken for a very short course and then stopped and not refilled. - ran out of anoro and didn't call The proper method of use, as well as anticipated side effects, of a metered-dose inhaler are discussed and demonstrated to the patient. Improved effectiveness after extensive coaching during this visit to a level of approximately  90% with anoro > restart with 6 weeks of samples   ? Acid (or non-acid) GERD > always difficult to exclude as up to 75% of pts in some series report no assoc GI/ Heartburn symptoms> rec max (24h)  acid suppression and diet restrictions/ reviewed and instructions given in writting  ? Beta blocker effects > change to bystolic

## 2013-02-19 NOTE — Patient Instructions (Addendum)
Prednisone 10 mg take  4 each am x 2 days,   2 each am x 2 days,  1 each am x 2 days and stop   Restart anoro  Stop carvedilol and start bystolic 10 mg one daily   Please schedule a follow up office visit in 6 weeks, call sooner if needed

## 2013-02-19 NOTE — Progress Notes (Signed)
Subjective:     Patient ID: Patricia Wall, female   DOB: 1950-03-25, 62 y.o.   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 rec Prilosec Take 30-60 min before first meal of the day  Stop all inhalers except ventolin and use it up to 2 puffs every 4 hours if can't catch your breath Anoro open once and take two dep drags each am as soon as you get up each day   02/19/2013 f/u ov/Wert re: copd gold II, better on anoro / still on coreg Chief Complaint  Patient presents with  . Follow-up    Pt reports having increased SOB and cough for the past 5 days- cough is prod with very minimal white to yellow sputum.  She also c/o chest tightness, using ventolin approx tid     While on anoro much less need for ventolin and now ventolin not really helping with doe but never at rest    No obvious day to day or daytime variabilty or assoc  cp  , subjective wheeze overt sinus or hb symptoms. No unusual exp hx or h/o childhood pna/ asthma or knowledge of premature birth.  Sleeping ok without nocturnal  or early am exacerbation  of respiratory  c/o's or need for noct saba. Also denies any obvious fluctuation of symptoms with weather or environmental changes or other aggravating or alleviating factors except as outlined above   Current Medications, Allergies, Complete Past Medical History, Past Surgical History, Family History, and Social History were reviewed in [employer]  record.    ROS  The following are not active complaints unless bolded sore throat, dysphagia, dental problems, itching, sneezing,  nasal congestion or excess/ purulent secretions, ear ache,   fever, chills, sweats, unintended wt loss, pleuritic or exertional cp, hemoptysis,  orthopnea pnd or leg swelling, presyncope, palpitations, heartburn, abdominal pain, anorexia, nausea, vomiting, diarrhea  or change in bowel or urinary habits, change in stools or urine, dysuria,hematuria,  rash, arthralgias, visual complaints, headache, numbness weakness or ataxia or problems with walking or coordination,  change in mood/affect or memory.                     Objective:  Physical Exam  amb wf nad  Wt Readings from Last 3 Encounters:  02/19/13 195 lb (88.451 kg)  01/16/13 192 lb 3.2 oz (87.181 kg)  11/15/12 190 lb (86.183 kg)         HEENT mild turbinate edema.  Oropharynx no thrush or excess pnd or cobblestoning.  No JVD or cervical adenopathy. Mild accessory muscle hypertrophy. Trachea midline, nl thryroid. Chest was hyperinflated by percussion with diminished breath sounds and moderate increased exp time without wheeze. Hoover sign positive at mid inspiration. Regular rate and rhythm without murmur gallop or rub or increase P2 or edema.  Abd: no hsm, nl excursion. Ext warm without cyanosis or clubbing.  CXR  01/16/2013 :     Hyperinflation. No active cardiopulmonary disease.  Assessment:

## 2013-02-19 NOTE — Assessment & Plan Note (Signed)
Strongly prefer in this setting: Bystolic, the most beta -1  selective Beta blocker available in sample form, with bisoprolol the most selective generic choice  on the market.   bystolic 10 mg x 6 weeks trial samples given

## 2013-02-21 ENCOUNTER — Telehealth: Payer: Self-pay | Admitting: Internal Medicine

## 2013-02-21 MED ORDER — ALBUTEROL SULFATE HFA 108 (90 BASE) MCG/ACT IN AERS: 2.0000 | INHALATION_SPRAY | Freq: Four times a day (QID) | RESPIRATORY_TRACT | Status: DC | PRN

## 2013-02-21 NOTE — Telephone Encounter (Signed)
Rx has been sent in. Pt is aware. 

## 2013-03-21 HISTORY — PX: BREAST LUMPECTOMY: SHX2

## 2013-04-02 ENCOUNTER — Ambulatory Visit (INDEPENDENT_AMBULATORY_CARE_PROVIDER_SITE_OTHER): Payer: Medicaid Other | Admitting: Internal Medicine

## 2013-04-02 ENCOUNTER — Encounter: Payer: Self-pay | Admitting: Internal Medicine

## 2013-04-02 VITALS — BP 120/66 | HR 80 | Temp 97.9°F | Ht 66.0 in | Wt 198.0 lb

## 2013-04-02 DIAGNOSIS — J069 Acute upper respiratory infection, unspecified: Secondary | ICD-10-CM

## 2013-04-02 DIAGNOSIS — IMO0001 Reserved for inherently not codable concepts without codable children: Secondary | ICD-10-CM

## 2013-04-02 DIAGNOSIS — J449 Chronic obstructive pulmonary disease, unspecified: Secondary | ICD-10-CM

## 2013-04-02 DIAGNOSIS — I10 Essential (primary) hypertension: Secondary | ICD-10-CM

## 2013-04-02 DIAGNOSIS — J4489 Other specified chronic obstructive pulmonary disease: Secondary | ICD-10-CM

## 2013-04-02 MED ORDER — BISOPROLOL FUMARATE 5 MG PO TABS: 5.0000 mg | ORAL_TABLET | Freq: Every day | ORAL | Status: DC

## 2013-04-02 MED ORDER — PREDNISONE 10 MG PO TABS: ORAL_TABLET | ORAL | Status: DC

## 2013-04-02 MED ORDER — UMECLIDINIUM-VILANTEROL 62.5-25 MCG/INH IN AEPB: 2.0000 | INHALATION_SPRAY | Freq: Once | RESPIRATORY_TRACT | Status: DC

## 2013-04-02 MED ORDER — AZITHROMYCIN 250 MG PO TABS: ORAL_TABLET | ORAL | Status: DC

## 2013-04-02 NOTE — Patient Instructions (Addendum)
Let us know if you can't fill the prescription for anoro one daily before your samples run out   Stop bystolic and start bisoprolol 5 mg one daily - take one half daily if too strong  Only use your albuterol as a rescue medication to be used if you can't catch your breath by resting or doing a relaxed purse lip breathing pattern.  - The less you use it, the better it will work when you need it. - Ok to use up to 2 puffs  every 4 hours if you must but call for immediate appointment if use goes up over your usual need - Don't leave home without it !!  (think of it like the spare tire for your car)    Prednisone 10 mg take  4 each am x 2 days,   2 each am x 2 days,  1 each am x 2 days and stop   Zpak  Please schedule a follow up visit in 3 months but call sooner if needed

## 2013-04-02 NOTE — Progress Notes (Signed)
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 rec Prilosec Take 30-60 min before first meal of the day  Stop all inhalers except ventolin and use it up to 2 puffs every 4 hours if can't catch your breath Anoro open once and take two dep drags each am as soon as you get up each day   02/19/2013 f/u ov/[name] re: copd gold II, better on anoro / still on coreg Chief Complaint  Patient presents with  . Follow-up    Pt reports having increased SOB and cough for the past 5 days- cough is prod with very minimal white to yellow sputum.  She also c/o chest tightness, using ventolin approx tid   While on anoro much less need for ventolin and now ventolin not really helping with doe but never at rest  Prednisone 10 mg take  4 each am x 2 days,   2 each am x 2 days,  1 each am x 2 days and stop  Restart anoro Stop carvedilol and start bystolic 10 mg one daily   04/02/2013 f/u ov/[name] re:  Chief Complaint  Patient presents with  . Follow-up    Pt c/o increased cough x 2 wks- prod with minimal light brown sputum. She states sputum is hard to produce. She states breathing is unchanged since the last visit. She is using rescue inhaler 3-4 times per wk.   Much better until [place] eve "caught a cold"  On anoro and prn saba   No obvious day to day or daytime variabilty or assoc  cp  , subjective wheeze overt sinus or hb symptoms. No  unusual exp hx or h/o childhood pna/ asthma or knowledge of premature birth.  Sleeping ok without nocturnal  or early am exacerbation  of respiratory  c/o's or need for noct saba. Also denies any obvious fluctuation of symptoms with weather or environmental changes or other aggravating or alleviating factors except as outlined above   Current Medications, Allergies, Complete Past Medical History, Past Surgical History, Family History, and Social History were reviewed in [facility] record.    ROS  The following are not active complaints unless bolded sore throat, dysphagia, dental problems, itching, sneezing,  nasal congestion or excess/ purulent secretions, ear ache,   fever, chills, sweats, unintended wt loss, pleuritic or exertional cp, hemoptysis,  orthopnea pnd or leg swelling, presyncope, palpitations, heartburn, abdominal pain, anorexia, nausea, vomiting, diarrhea  or change in bowel or urinary habits, change in stools or urine, dysuria,hematuria,  rash, arthralgias, visual complaints, headache, numbness weakness or ataxia or problems with walking or coordination,  change in mood/affect or memory.                     Objective:  Physical Exam  amb wf nad  04/02/2013       198  Wt Readings from Last 3 Encounters:  02/19/13 195 lb (88.451 kg)  01/16/13 192 lb 3.2 oz (87.181 kg)  11/15/12 190 lb (86.183 kg)         HEENT mild turbinate edema.  Oropharynx no thrush or excess pnd or cobblestoning.  No JVD or cervical adenopathy. Mild accessory muscle hypertrophy. Trachea midline, nl thryroid. Chest was hyperinflated by percussion with diminished breath sounds and moderate increased exp time without wheeze. Hoover sign positive at mid inspiration. Regular rate and rhythm without murmur gallop or rub or increase P2 or edema.  Abd: no hsm, nl excursion. Ext warm without cyanosis or clubbing.      CXR  01/16/2013 :  Hyperinflation. No active cardiopulmonary  disease.  Assessment:

## 2013-04-03 DIAGNOSIS — J069 Acute upper respiratory infection, unspecified: Secondary | ICD-10-CM | POA: Insufficient documentation

## 2013-04-03 NOTE — Assessment & Plan Note (Signed)
rx zpak and prednisone x 6 days only, f/u if not 100% back to baseline by end of rx

## 2013-04-03 NOTE — Assessment & Plan Note (Signed)
-   PFT's 12/12/12 FEV1  1.41 (53%) ratio 54 with no significant reversibility and DLCO 47% corrects to 50 01/16/2013  Walked RA x 2 laps @ 185 ft each stopped due to sob, no desat     - Trial of anoro 01/16/2013 >>>  Mild flare in setting of uri but overall doing much better on anoro when she has access to it.   Samples given and she was asked to call if not able to get rx filled    Each maintenance medication was reviewed in detail including most importantly the difference between maintenance and as needed and under what circumstances the prns are to be used.  Please see instructions for details which were reviewed in writing and the patient given a copy.

## 2013-04-03 NOTE — Assessment & Plan Note (Signed)
Changed from coreg to bystolic 02/19/2013 for copd refractory to SABA> improved   - changed from bystolic to bisoprolol for insurance purposes at 5 mg daily

## 2013-05-29 ENCOUNTER — Encounter: Payer: Self-pay | Admitting: Cardiovascular Disease

## 2013-07-04 ENCOUNTER — Telehealth: Payer: Self-pay | Admitting: *Deleted

## 2013-07-04 MED ORDER — NEBIVOLOL HCL 10 MG PO TABS: 10.0000 mg | ORAL_TABLET | Freq: Every day | ORAL | Status: DC

## 2013-07-04 NOTE — Telephone Encounter (Signed)
Per Dr Sherene SiresWert, Please give sample until next ov and will discuss then

## 2013-07-04 NOTE — Telephone Encounter (Addendum)
Regarding Bisoprolol PA- preferred meds are bystolic, atenolol, carvedilol, or metoprolol  Per MW- change to the Bystolic 10 mg daily  Pt aware and okay with med change  Rx was sent to pharm and MAR updated

## 2013-07-04 NOTE — Telephone Encounter (Signed)
Spoke with the pt and left samples up front and scheduled ov for 07/19/13 Nothing further needed per pt

## 2013-07-04 NOTE — Telephone Encounter (Signed)
PA forms received and filled out and given to MW to sign.  Alternatives for the anoro are spiriva.  Alternatives for the bisoprolol are as follows:  Atenolol bystolic Metoprolol Carvedilol  Will send this message to leslie to follow up on.

## 2013-07-04 NOTE — Addendum Note (Signed)
Addended by: Christen ButterASKIN, Anh Bigos M on: 07/04/2013 05:26 PM   Modules accepted: Orders, Medications

## 2013-07-19 ENCOUNTER — Ambulatory Visit: Payer: Medicaid Other | Admitting: Internal Medicine

## 2013-07-31 ENCOUNTER — Other Ambulatory Visit: Payer: Self-pay | Admitting: Internal Medicine

## 2013-08-18 ENCOUNTER — Other Ambulatory Visit: Payer: Self-pay | Admitting: Internal Medicine

## 2013-09-23 ENCOUNTER — Other Ambulatory Visit: Payer: Self-pay | Admitting: Internal Medicine

## 2013-09-24 ENCOUNTER — Other Ambulatory Visit: Payer: Self-pay | Admitting: Internal Medicine

## 2013-10-02 ENCOUNTER — Ambulatory Visit (INDEPENDENT_AMBULATORY_CARE_PROVIDER_SITE_OTHER)
Admission: RE | Admit: 2013-10-02 | Discharge: 2013-10-02 | Disposition: A | Discharge status: Home / Self Care | Payer: Medicare Other | Source: Ambulatory Visit | Attending: Internal Medicine | Admitting: Internal Medicine

## 2013-10-02 ENCOUNTER — Encounter: Payer: Self-pay | Admitting: Internal Medicine

## 2013-10-02 ENCOUNTER — Other Ambulatory Visit (INDEPENDENT_AMBULATORY_CARE_PROVIDER_SITE_OTHER): Payer: Medicare Other

## 2013-10-02 ENCOUNTER — Ambulatory Visit (INDEPENDENT_AMBULATORY_CARE_PROVIDER_SITE_OTHER): Payer: Medicare Other | Admitting: Internal Medicine

## 2013-10-02 VITALS — BP 90/56 | HR 134 | Temp 98.1°F | Ht 66.0 in | Wt 206.8 lb

## 2013-10-02 DIAGNOSIS — R0600 Dyspnea, unspecified: Secondary | ICD-10-CM

## 2013-10-02 DIAGNOSIS — R0989 Other specified symptoms and signs involving the circulatory and respiratory systems: Secondary | ICD-10-CM

## 2013-10-02 DIAGNOSIS — R0609 Other forms of dyspnea: Secondary | ICD-10-CM

## 2013-10-02 DIAGNOSIS — I10 Essential (primary) hypertension: Secondary | ICD-10-CM | POA: Diagnosis not present

## 2013-10-02 DIAGNOSIS — J449 Chronic obstructive pulmonary disease, unspecified: Secondary | ICD-10-CM

## 2013-10-02 DIAGNOSIS — IMO0001 Reserved for inherently not codable concepts without codable children: Secondary | ICD-10-CM

## 2013-10-02 LAB — BRAIN NATRIURETIC PEPTIDE: PRO B NATRI PEPTIDE: 29 pg/mL (ref 0.0–100.0)

## 2013-10-02 LAB — CBC WITH DIFFERENTIAL/PLATELET
BASOS ABS: 0 10*3/uL (ref 0.0–0.1)
Basophils Relative: 0.3 % (ref 0.0–3.0)
Eosinophils Absolute: 0.4 10*3/uL (ref 0.0–0.7)
Eosinophils Relative: 3.9 % (ref 0.0–5.0)
HCT: 36.2 % (ref 36.0–46.0)
Hemoglobin: 12 g/dL (ref 12.0–15.0)
LYMPHS PCT: 20.4 % (ref 12.0–46.0)
Lymphs Abs: 1.9 10*3/uL (ref 0.7–4.0)
MCHC: 33 g/dL (ref 30.0–36.0)
MCV: 89 fl (ref 78.0–100.0)
MONOS PCT: 6.6 % (ref 3.0–12.0)
Monocytes Absolute: 0.6 10*3/uL (ref 0.1–1.0)
Neutro Abs: 6.4 10*3/uL (ref 1.4–7.7)
Neutrophils Relative %: 68.8 % (ref 43.0–77.0)
PLATELETS: 220 10*3/uL (ref 150.0–400.0)
RBC: 4.07 Mil/uL (ref 3.87–5.11)
RDW: 15.4 % (ref 11.5–15.5)
WBC: 9.3 10*3/uL (ref 4.0–10.5)

## 2013-10-02 LAB — TSH: TSH: 1.32 u[IU]/mL (ref 0.35–4.50)

## 2013-10-02 LAB — BASIC METABOLIC PANEL
BUN: 11 mg/dL (ref 6–23)
CO2: 29 mEq/L (ref 19–32)
CREATININE: 0.7 mg/dL (ref 0.4–1.2)
Calcium: 9.2 mg/dL (ref 8.4–10.5)
Chloride: 102 mEq/L (ref 96–112)
GFR: 87.72 mL/min (ref >60.00)
Glucose, Bld: 188 mg/dL — ABNORMAL HIGH (ref 70–99)
POTASSIUM: 3.6 meq/L (ref 3.5–5.1)
Sodium: 138 mEq/L (ref 135–145)

## 2013-10-02 MED ORDER — BISOPROLOL FUMARATE 5 MG PO TABS: ORAL_TABLET | ORAL | Status: DC

## 2013-10-02 MED ORDER — AZITHROMYCIN 250 MG PO TABS: ORAL_TABLET | ORAL | Status: DC

## 2013-10-02 MED ORDER — LEVALBUTEROL HCL 0.63 MG/3ML IN NEBU
0.6300 mg | INHALATION_SOLUTION | Freq: Once | RESPIRATORY_TRACT | Status: AC
  Administered 2013-10-02: 0.63 mg via RESPIRATORY_TRACT

## 2013-10-02 MED ORDER — UMECLIDINIUM BROMIDE 62.5 MCG/INH IN AEPB: 1.0000 | INHALATION_SPRAY | Freq: Every morning | RESPIRATORY_TRACT | Status: DC

## 2013-10-02 MED ORDER — PREDNISONE 10 MG PO TABS: ORAL_TABLET | ORAL | Status: DC

## 2013-10-02 NOTE — Assessment & Plan Note (Addendum)
-   pft's 12/12/12 FEV1  1.41 (53%) ratio 54 with no significant reversibility and DLCO 47% corrects to 50 01/16/2013  Walked RA x 2 laps @ 185 ft each stopped due to sob, no desat     - Trial of anoro 01/16/2013 > helped but insurance would not pay  DDX of  difficult airways management all start with A and  include Adherence, Ace Inhibitors, Acid Reflux, Active Sinus Disease, Alpha 1 Antitripsin deficiency, Anxiety masquerading as Airways dz,  ABPA,  allergy(esp in young), Aspiration (esp in elderly), Adverse effects of DPI,  Active smokers, plus two Bs  = Bronchiectasis and Beta blocker use..and one C= CHF  In this case Adherence is the biggest issue and starts with  inability to use HFA effectively and over using it to the point of causing tremor and tachycardia so try lama plus prn saba    To keep things simple, I have asked the patient to first separate medicines that are perceived as maintenance, that is to be taken daily "no matter what", from those medicines that are taken on only on an as-needed basis and I have given the patient examples of both, and then return to see our NP to generate a  detailed  medication calendar which should be followed until the next physician sees the patient and updates it.    ? Acid (or non-acid) GERD > always difficult to exclude as up to 75% of pts in some series report no assoc GI/ Heartburn symptoms> rec continue max (24h)  acid suppression and diet restrictions/ reviewed

## 2013-10-02 NOTE — Patient Instructions (Addendum)
Incruse 2 puffs off one click each am  Bisoprolol 5 mg take one daily   Zpak/Prednisone 10 mg take  4 each am x 2 days,   2 each am x 2 days,  1 each am x 2 days and stop called in  Only use your albuterol (proair/RED)as a rescue medication to be used if you can't catch your breath by resting or doing a relaxed purse lip breathing pattern.  - The less you use it, the better it will work when you need it. - Ok to use up to 2 puffs  every 4 hours if you must but call for immediate appointment if use goes up over your usual need - Don't leave home without it !!  (think of it like the spare tire for your car)    See Tammy NP w/in 1 week  with all your medications, even over the counter meds, separated in two separate bags, the ones you take no matter what vs the ones you stop once you feel better and take only as needed when you feel you need them.   Tammy  will generate for you a new user friendly medication calendar that will put us all on the same page re: your medication use.     Without this process, it simply isn't possible to assure that we are providing  your outpatient care  with  the attention to detail we feel you deserve.   If we cannot assure that you're getting that kind of care,  then we cannot manage your problem effectively from this clinic.  Once you have seen Tammy and we are sure that we're all on the same page with your medication use she will arrange follow up with me.

## 2013-10-02 NOTE — Progress Notes (Signed)
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 rec Prilosec Take 30-60 min before first meal of the day  Stop all inhalers except ventolin and use it up to 2 puffs every 4 hours if can't catch your breath Anoro open once and take two dep drags each am as soon as you get up each day   02/19/2013 f/u ov/Patricia Wall re: copd gold II, better on anoro / still on coreg Chief Complaint  Patient presents with  . Follow-up    Pt reports having increased SOB and cough for the past 5 days- cough is prod with very minimal white to yellow sputum.  She also c/o chest tightness, using ventolin approx tid   While on anoro much less need for ventolin and now ventolin not really helping with doe but never at rest  Prednisone 10 mg take  4 each am x 2 days,   2 each am x 2 days,  1 each am x 2 days and stop  Restart anoro Stop carvedilol and start bystolic 10 mg one daily    04/02/2013 f/u ov/Patricia Wall re: GOLD II copd  Chief Complaint  Patient presents with  . Follow-up    Pt c/o increased cough x 2 wks- prod with minimal light brown sputum. She states sputum is hard to produce. She states breathing is unchanged since the last visit. She is using rescue inhaler 3-4 times per wk.   Much better until [place] "caught a cold"  On anoro and prn saba  rec Let us know if you can't fill the prescription for anoro one daily before your samples run  out  Stop bystolic and start bisoprolol 5 mg one daily - take one half daily if too strong Only use your albuterol as a rescue   Prednisone 10 mg take  4 each am x 2 days,   2 each am x 2 days,  1 each am x 2 days and stop  Zpak    10/02/2013 f/u ov/Patricia Wall re: GOLD II copd / seeing multiple disconnected emr docs/ has 02 at hs ? 3lpm Chief Complaint  Patient presents with  . Follow-up    Pt c/o increased SOB and cough for the past 2-3 wks.   med sheets are incorrect, no longer on any BB/ over using saba to point where using 10-12 x per day  No brown sputum x 2 weeks.      No obvious day to day or daytime variabilty or assoc  cp  , subjective wheeze overt sinus or hb symptoms. No unusual exp hx or h/o childhood pna/ asthma or knowledge of premature birth.  Sleeping ok without nocturnal  or early am exacerbation  of respiratory  c/o's or need for noct saba. Also denies any obvious fluctuation of symptoms with weather or environmental  changes or other aggravating or alleviating factors except as outlined above   Current Medications, Allergies, Complete Past Medical History, Past Surgical History, Family History, and Social History were reviewed in [employer] record.    ROS  The following are not active complaints unless bolded sore throat, dysphagia, dental problems, itching, sneezing,  nasal congestion or excess/ purulent secretions, ear ache,   fever, chills, sweats, unintended wt loss, pleuritic or exertional cp, hemoptysis,  orthopnea pnd or leg swelling, presyncope, palpitations, heartburn, abdominal pain, anorexia, nausea, vomiting, diarrhea  or change in bowel or urinary habits, change in stools or urine, dysuria,hematuria,  rash, arthralgias, visual complaints, headache, numbness weakness or ataxia or problems with walking or coordination,  change in mood/affect or memory.                     Objective:  Physical Exam  amb wf nad very shaky and unsteady on  her feet    04/02/2013       198  >   10/02/2013   207  Wt Readings from Last 3 Encounters:  02/19/13 195 lb (88.451 kg)  01/16/13 192 lb 3.2 oz (87.181 kg)  11/15/12 190 lb (86.183 kg)         HEENT mild turbinate edema.  Oropharynx no thrush or excess pnd or cobblestoning.  No JVD or cervical adenopathy. Mild accessory muscle hypertrophy. Trachea midline, nl thryroid. Chest was hyperinflated by percussion with diminished breath sounds and moderate increased exp time with trace end exp wheeze. Hoover sign positive at mid inspiration. Regular rate and rhythm without murmur gallop or rub or increase P2 or edema.  Abd: no hsm, nl excursion. Ext warm without cyanosis or clubbing.      CXR  10/02/2013 :  Chronic bronchitic markings. No acute cardiopulmonary findings. Unchanged from prior.   .  Recent Labs Lab 10/02/13 1000  NA 138  K 3.6  CL 102  CO2 29  BUN 11  CREATININE 0.7  GLUCOSE 188*    Recent Labs Lab 10/02/13 1000  HGB 12.0  HCT 36.2  WBC 9.3  PLT 220.0      Lab Results  Component Value Date   PROBNP 29.0 10/02/2013    Lab Results  Component Value Date   TSH 1.32 10/02/2013     Assessment:

## 2013-10-02 NOTE — Assessment & Plan Note (Signed)
Labs reviewed - no evidence of significant chf/ sepsis, has 02 to use prn but really still does not need it by resting sats but perhaps with acitivity.  Will treat her as aecopd then regroup in one week

## 2013-10-02 NOTE — Assessment & Plan Note (Signed)
Restart bisoprolol 2.5 mg daily to help control tachycardia

## 2013-10-02 NOTE — Progress Notes (Signed)
Quick Note:  Spoke with pt and notified of results per Dr. Wert. Pt verbalized understanding and denied any questions.  ______ 

## 2013-10-15 ENCOUNTER — Ambulatory Visit (INDEPENDENT_AMBULATORY_CARE_PROVIDER_SITE_OTHER): Payer: Medicare Other | Admitting: Adult Health

## 2013-10-15 ENCOUNTER — Encounter: Payer: Self-pay | Admitting: Adult Health

## 2013-10-15 VITALS — BP 110/60 | HR 114 | Temp 98.0°F | Ht 66.0 in | Wt 201.0 lb

## 2013-10-15 DIAGNOSIS — IMO0001 Reserved for inherently not codable concepts without codable children: Secondary | ICD-10-CM

## 2013-10-15 DIAGNOSIS — Z23 Encounter for immunization: Secondary | ICD-10-CM

## 2013-10-15 DIAGNOSIS — J449 Chronic obstructive pulmonary disease, unspecified: Secondary | ICD-10-CM

## 2013-10-15 NOTE — Patient Instructions (Addendum)
Follow med calendar closely and bring to each visit.  Prevnar Vaccine today.  Restart Carvedilol 3.125mg Twice daily  (Since insurance would not cover Bisoprolol)  Restart Spiriva 1 puff daily (Since Insurance would not cover Incruse)  Follow up Dr. Wert  In 3 months and As needed    

## 2013-10-18 ENCOUNTER — Telehealth: Payer: Self-pay | Admitting: Internal Medicine

## 2013-10-18 MED ORDER — CARVEDILOL 3.125 MG PO TABS
3.1250 mg | ORAL_TABLET | Freq: Two times a day (BID) | ORAL | Status: DC
Start: 2013-10-18 — End: 2014-02-15

## 2013-10-18 MED ORDER — TIOTROPIUM BROMIDE MONOHYDRATE 18 MCG IN CAPS: 18.0000 ug | ORAL_CAPSULE | Freq: Every day | RESPIRATORY_TRACT | Status: DC

## 2013-10-18 NOTE — Telephone Encounter (Signed)
RX's called in. Nothing further needed

## 2013-10-18 NOTE — Assessment & Plan Note (Signed)
Follow med calendar closely and bring to each visit.  Prevnar Vaccine today.  Restart Carvedilol 3.125mg  Twice daily  (Since insurance would not cover Bisoprolol)  Restart Spiriva 1 puff daily (Since Insurance would not cover Incruse)  Follow up Dr. Sherene SiresWert  In 3 months and As needed

## 2013-10-18 NOTE — Progress Notes (Signed)
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 rec Prilosec Take 30-60 min before first meal of the day  Stop all inhalers except ventolin and use it up to 2 puffs every 4 hours if can't catch your breath Anoro open once and take two dep drags each am as soon as you get up each day   02/19/2013 f/u ov/Wert re: copd gold II, better on anoro / still on coreg Chief Complaint  Patient presents with  . Follow-up    Pt reports having increased SOB and cough for the past 5 days- cough is prod with very minimal white to yellow sputum.  She also c/o chest tightness, using ventolin approx tid   While on anoro much less need for ventolin and now ventolin not really helping with doe but never at rest  Prednisone 10 mg take  4 each am x 2 days,   2 each am x 2 days,  1 each am x 2 days and stop  Restart anoro Stop carvedilol and start bystolic 10 mg one daily    04/02/2013 f/u ov/Wert re: GOLD II copd  Chief Complaint  Patient presents with  . Follow-up    Pt c/o increased cough x 2 wks- prod with minimal light brown sputum. She states sputum is hard to produce. She states breathing is unchanged since the last visit. She is using rescue inhaler 3-4 times per wk.   Much better until [place] eve "caught a cold"  On anoro and prn saba  rec Let us know if you can't fill the prescription for anoro one daily before your samples run  out  Stop bystolic and start bisoprolol 5 mg one daily - take one half daily if too strong Only use your albuterol as a rescue   Prednisone 10 mg take  4 each am x 2 days,   2 each am x 2 days,  1 each am x 2 days and stop  Zpak    10/02/2013 f/u ov/Wert re: GOLD II copd / seeing multiple disconnected emr docs/ has 02 at hs ? 3lpm Chief Complaint  Patient presents with  . Follow-up    Pt c/o increased SOB and cough for the past 2-3 wks.   med sheets are incorrect, no longer on any BB/ over using saba to point where using 10-12 x per day  No brown sputum x 2 weeks.    >>rx incruse and zebeta in place of spiriva and coreg.   10/15/13 Follow up and Med review  Returns for follow up and med review .  Daughter is with her today that helps with meds .  Feels better on inhalers but insurance will not cover.   Medicaid/Medicare will not cover the Incruse or Zebeta We reviewed all her medications organized them into a medication  calendar with patient education. Patient denies any hemoptysis, orthopnea, PND, or increaesd leg swelling.  Chest x-ray last visit showed chronic bronchitic changes, without acute process    Current Medications, Allergies, Complete Past Medical History, Past Surgical History, Family History, and Social History were reviewed in [facility] electronic medical record.    ROS  The following are not active complaints unless bolded sore throat, dysphagia, dental problems, itching, sneezing,  nasal congestion or excess/ purulent secretions, ear ache,   fever, chills, sweats, unintended wt loss, pleuritic or exertional cp, hemoptysis,  orthopnea pnd or leg swelling, presyncope, palpitations, heartburn, abdominal pain, anorexia, nausea, vomiting, diarrhea  or change in bowel or urinary habits, change in stools or urine, dysuria,hematuria,  rash, arthralgias, visual complaints, headache, numbness weakness or ataxia or problems with walking or coordination,  change in  mood/affect or memory.                     Objective:  Physical Exam  amb wf nad    04/02/2013       198  >   10/02/2013   207 >  201 10/15/13      HEENT mild turbinate edema.  Oropharynx no thrush or excess pnd or cobblestoning.  No JVD or cervical adenopathy. Mild accessory muscle hypertrophy. Trachea midline, nl thryroid. Chest was hyperinflated by percussion with diminished breath sounds in bases   Regular rate and rhythm without murmur gallop or rub or increase P2 or edema.  Abd: no hsm, nl excursion. Ext warm without cyanosis or clubbing.      CXR  10/02/2013 :  Chronic bronchitic markings. No acute cardiopulmonary findings. Unchanged from prior.   .  Recent Labs Lab 10/02/13 1000  NA 138  K 3.6  CL 102  CO2 29  BUN 11  CREATININE 0.7  GLUCOSE 188*    Recent Labs Lab 10/02/13 1000  HGB 12.0  HCT 36.2  WBC 9.3  PLT 220.0      Lab Results  Component Value Date   PROBNP 29.0 10/02/2013    Lab Results  Component Value Date   TSH 1.32 10/02/2013     Assessment:

## 2013-11-01 ENCOUNTER — Encounter: Payer: Self-pay | Admitting: Internal Medicine

## 2013-11-01 ENCOUNTER — Telehealth: Payer: Self-pay | Admitting: Internal Medicine

## 2013-11-01 DIAGNOSIS — J9611 Chronic respiratory failure with hypoxia: Secondary | ICD-10-CM | POA: Insufficient documentation

## 2013-11-01 NOTE — Telephone Encounter (Signed)
Per MW- ONO RA pos Needs to continue o2 2lpm with sleep  LMTCB for the pt

## 2013-11-01 NOTE — Telephone Encounter (Signed)
Spoke with pt and notified of results per Dr. Wert. Pt verbalized understanding and denied any questions. 

## 2013-12-02 ENCOUNTER — Encounter: Payer: Self-pay | Admitting: Physician Assistant

## 2013-12-02 ENCOUNTER — Ambulatory Visit (INDEPENDENT_AMBULATORY_CARE_PROVIDER_SITE_OTHER): Payer: Medicare Other | Admitting: Physician Assistant

## 2013-12-02 ENCOUNTER — Encounter (HOSPITAL_COMMUNITY): Payer: Self-pay | Admitting: Pharmacy Technician

## 2013-12-02 ENCOUNTER — Encounter: Payer: Self-pay | Admitting: *Deleted

## 2013-12-02 ENCOUNTER — Ambulatory Visit (HOSPITAL_COMMUNITY): Payer: Medicare Other | Attending: Physician Assistant | Admitting: Radiology

## 2013-12-02 ENCOUNTER — Other Ambulatory Visit (HOSPITAL_COMMUNITY): Payer: Self-pay | Admitting: Physician Assistant

## 2013-12-02 VITALS — BP 120/68 | HR 114 | Ht 66.0 in | Wt 204.0 lb

## 2013-12-02 DIAGNOSIS — R079 Chest pain, unspecified: Secondary | ICD-10-CM | POA: Diagnosis present

## 2013-12-02 DIAGNOSIS — R55 Syncope and collapse: Secondary | ICD-10-CM | POA: Diagnosis present

## 2013-12-02 DIAGNOSIS — I6529 Occlusion and stenosis of unspecified carotid artery: Secondary | ICD-10-CM | POA: Diagnosis not present

## 2013-12-02 DIAGNOSIS — I209 Angina pectoris, unspecified: Secondary | ICD-10-CM | POA: Insufficient documentation

## 2013-12-02 DIAGNOSIS — I10 Essential (primary) hypertension: Secondary | ICD-10-CM

## 2013-12-02 DIAGNOSIS — R0989 Other specified symptoms and signs involving the circulatory and respiratory systems: Secondary | ICD-10-CM

## 2013-12-02 DIAGNOSIS — I2510 Atherosclerotic heart disease of native coronary artery without angina pectoris: Secondary | ICD-10-CM | POA: Diagnosis present

## 2013-12-02 DIAGNOSIS — F17200 Nicotine dependence, unspecified, uncomplicated: Secondary | ICD-10-CM

## 2013-12-02 LAB — BASIC METABOLIC PANEL
BUN: 13 mg/dL (ref 6–23)
CO2: 30 mEq/L (ref 19–32)
Calcium: 8.7 mg/dL (ref 8.4–10.5)
Chloride: 100 mEq/L (ref 96–112)
Creatinine, Ser: 0.7 mg/dL (ref 0.4–1.2)
GFR: 89.13 mL/min (ref >60.00)
GLUCOSE: 213 mg/dL — AB (ref 70–99)
POTASSIUM: 3.5 meq/L (ref 3.5–5.1)
SODIUM: 137 meq/L (ref 135–145)

## 2013-12-02 LAB — CBC WITH DIFFERENTIAL/PLATELET
BASOS PCT: 0.4 % (ref 0.0–3.0)
Basophils Absolute: 0 10*3/uL (ref 0.0–0.1)
EOS PCT: 1.9 % (ref 0.0–5.0)
Eosinophils Absolute: 0.2 10*3/uL (ref 0.0–0.7)
HEMATOCRIT: 34.7 % — AB (ref 36.0–46.0)
Hemoglobin: 11.7 g/dL — ABNORMAL LOW (ref 12.0–15.0)
LYMPHS ABS: 1.7 10*3/uL (ref 0.7–4.0)
Lymphocytes Relative: 19.1 % (ref 12.0–46.0)
MCHC: 33.7 g/dL (ref 30.0–36.0)
MCV: 90.3 fl (ref 78.0–100.0)
MONOS PCT: 7.7 % (ref 3.0–12.0)
Monocytes Absolute: 0.7 10*3/uL (ref 0.1–1.0)
NEUTROS ABS: 6.2 10*3/uL (ref 1.4–7.7)
Neutrophils Relative %: 70.9 % (ref 43.0–77.0)
Platelets: 213 10*3/uL (ref 150.0–400.0)
RBC: 3.84 Mil/uL — ABNORMAL LOW (ref 3.87–5.11)
RDW: 15.4 % (ref 11.5–15.5)
WBC: 8.7 10*3/uL (ref 4.0–10.5)

## 2013-12-02 LAB — PROTIME-INR
INR: 1 ratio (ref 0.8–1.0)
Prothrombin Time: 11 s (ref 9.6–13.1)

## 2013-12-02 NOTE — Assessment & Plan Note (Addendum)
60-79% stenosis in 11/2012, Recheck carotids today.

## 2013-12-02 NOTE — Progress Notes (Signed)
Carotid Duplex performed. 

## 2013-12-02 NOTE — Assessment & Plan Note (Signed)
Patient has had recurrent syncope with collapse and loss of bladder control recently. She is orthostatic. She also has daily chest pain and significant history of CAD. She also has significant carotid disease. She is tachycardic in the office but it will not increase her carvedilol because of orthostasis. Discussed this patient in detail with Dr. Delton See who agrees she should be admitted for cardiac catheterization. We will check carotid Dopplers today in the office. Patient is agreeable.

## 2013-12-02 NOTE — Assessment & Plan Note (Signed)
Patient's blood pressure is elevated but she is orthostatic as well.

## 2013-12-02 NOTE — Assessment & Plan Note (Signed)
Patient is having 2-3 episodes of chest tightness and pressure consistent with her prior angina. Because of this we will admit her for cardiac catheterization. She has history of stent to the LAD in 2005, stent to the distal circumflex and no attempt to branch in 2005 with balloon angioplasty for restenosis in the circumflex in 2006. Last cath in 2011 patent stents no critical stenosis.

## 2013-12-02 NOTE — Assessment & Plan Note (Signed)
Patient quit smoking 

## 2013-12-02 NOTE — Progress Notes (Signed)
HPI: This is a 63 year old female patient of Dr.Nishan who has history of coronary artery disease status post previous stent to the OM and LAD. She has Cypher stent to the LAD in 2005, Cypher stent to the distal circumflex and OM 2 branch and 2005 with balloon angioplasty for restenosis of the circumflex in 2006. Last cath in 08/2009 patent stents and no critical stenosis. She also has severe COPD followed by Dr. Sherene Sires and 60-79% right ICA stenosis supposed to have Dopplers every 6 months but it's been over a year.  Patient comes in today accompanied by her daughter because of recurrent syncope. Last week she got up from sitting and was getting dog food out of the refrigerator and collapsed losing control of her bladder. Her husband states that she was unconscious for about 2 minutes. She fractured her lower back and is supposed to see an orthopedic doctor this week. She  Is also being  worked up by a Insurance account manager. All this occurred at [facility]. She states she's been passing out for over a year. She doesn't get dizzy when she first stands up but after she starts walking she gets dizzy. She has significant orthostasis in the office today. She is in significant pain from her back. She is also tachycardic.   When speaking with the patient she complains of daily chest tightness and pressure associated with shortness of breath. She has not used nitroglycerin for this. She says it happens at least 2 or 3 times a day. She says she hasn't told anyone about it because she went through breast cancer treatment in May with lumpectomy and radiation and hasn't had time.  No Known Allergies   Current Outpatient Prescriptions  Medication Sig Dispense Refill  . albuterol (PROAIR HFA) 108 (90 BASE) MCG/ACT inhaler Inhale 2 puffs into the lungs every 4 (four) hours as needed for wheezing or shortness of breath.      [name] aspirin 81 MG tablet Take 81 mg by mouth at bedtime.       [name] atorvastatin (LIPITOR)  40 MG tablet Take 40 mg by mouth at bedtime.      [name] azithromycin (ZITHROMAX) 250 MG tablet Take 2 on day one then 1 daily x 4 days  6 tablet  0  . calcium carbonate (OS-CAL) 600 MG TABS Take 600 mg by mouth daily.        . carvedilol (COREG) 3.125 MG tablet Take 1 tablet (3.125 mg total) by mouth 2 (two) times daily with a meal.  60 tablet  3  . Cholecalciferol (VITAMIN D) 2000 UNITS tablet Take 2,000 Units by mouth 2 (two) times daily at 8 am and 10 pm. Taking one cap. Two times daily      . clopidogrel (PLAVIX) 75 MG tablet Take 75 mg by mouth daily.        . DULoxetine (CYMBALTA) 60 MG capsule Take 60 mg by mouth 2 (two) times daily.      [name] gabapentin (NEURONTIN) 300 MG capsule 2 capsules by mouth at bedtime      . HYDROcodone-acetaminophen (NORCO/VICODIN) 5-325 MG per tablet Take 1 tablet by mouth every 6 (six) hours as needed for moderate pain.      [name] letrozole (FEMARA) 2.5 MG tablet Take 2.5 mg by mouth daily.      . Multiple Vitamin (MULTIVITAMIN) tablet Take 1 tablet by mouth daily.      . niacin (NIASPAN) 1000 MG CR tablet 2 tabs by mouth  at bedtime      . nitroGLYCERIN (NITROSTAT) 0.4 MG SL tablet Place 0.4 mg under the tongue every 5 (five) minutes as needed for chest pain (may repeat x3).       . nystatin cream (MYCOSTATIN) Apply 1 application topically 2 (two) times daily as needed (rash).      [name] omeprazole (PRILOSEC) 40 MG capsule Take 40 mg by mouth daily.      . predniSONE (DELTASONE) 10 MG tablet Take  4 each am x 2 days,   2 each am x 2 days,  1 each am x 2 days and stop  14 tablet  0  . tiotropium (SPIRIVA) 18 MCG inhalation capsule Place 1 capsule (18 mcg total) into inhaler and inhale daily.  30 capsule  6   No current facility-administered medications for this visit.    Past Medical History  Diagnosis Date  . Chest pain   . HTN (hypertension)   . TIA (transient ischemic attack)   . CAD (coronary artery disease)      Prev seen by [facility].  Stent to OM and LAD Last cath  6/11 cathed on June 13, [name]. 2011.  The cardiac catheterization showed 30% and 50% lesions in the mid  and distal LAD, 50% in the OM1, 40% in the OM2 with 20% in-stent  restenosis, 40% in-stent restenosis in the circumflex and no significant  disease in the RCA.  Her EF was normal.  Dr. Juanda Chance recommended a trial  of proton pump inhibitors   . PVD (peripheral vascular disease)   . Abnormal chest xray   . Vertigo   . Lymphadenitis   . Pharyngitis   . Sinusitis   . Insomnia   . Cyanosis   . Tobacco abuse   . IBS (irritable bowel syndrome)   . Diarrhea   . Hemorrhoids   . DDD (degenerative disc disease)   . Alcohol abuse   . PUD (peptic ulcer disease)   . Back pain   . GERD (gastroesophageal reflux disease)   . Diverticular disease   . Depression   . COPD (chronic obstructive pulmonary disease)   . H/O: hysterectomy     Past Surgical History  Procedure Laterality Date  . Appendectomy    . Tubal ligation    . Cardiac catheterization  2005, 2006, 2009,2011  . Coronary stent placement      Status post balloon angiogram plasty and Cypher stent to the distal    circumflex and OM2 branch  . Left bunionectomy    . Hammertoe repair      Family History  Problem Relation Age of Onset  . Coronary artery disease    . Emphysema Father   . Asthma Sister     2 sisters  . Heart disease Mother   . Heart disease Father   . Rheumatologic disease Mother   . Cancer - Other Mother     Uterine  . Cancer - Other Father   . Stroke Sister     History   Social History  . Marital Status: Legally Separated    Spouse Name: N/A    Number of Children: N/A  . Years of Education: N/A   Occupational History  . Not on file.   Social History Main Topics  . Smoking status: Former Smoker -- 2.00 packs/day for 52 years    Types: Cigarettes    Start date: 01/16/1961    Quit date: 10/19/2012  . Smokeless tobacco: Not on file  .  Alcohol Use: No  . Drug Use: Yes     Comment: Seldom Marijuana  use- 1Xmonth  . Sexual Activity: Not on file   Other Topics Concern  . Not on file   Social History Narrative  . No narrative on file    ROS: See HPI Eyes: Negative Ears:Negative for hearing loss, tinnitus Cardiovascular: Positive for chest pain, negative palpitations,irregular heartbeat, edema, claudication, cyanosis,.  Respiratory:   Chronic dyspnea on exertion and wheezing followed by Dr. Sherene Sires   Endocrine: Negative for cold intolerance and heat intolerance.  Hematologic/Lymphatic: Negative for adenopathy and bleeding problem. Does not bruise/bleed easily.  Musculoskeletal: Negative.   Gastrointestinal: Negative for nausea, vomiting, reflux, abdominal pain, diarrhea, constipation.   Genitourinary: Negative for bladder incontinence, dysuria, flank pain, frequency, hematuria, hesitancy, nocturia and urgency.  Neurological: Negative.  Allergic/Immunologic: Negative for environmental allergies.   BP 120/68  Pulse 114  Ht  (1.676 m)  Wt 92.534 kg (204 lb)  BMI 32.94 kg/m2 see orthostatics  PHYSICAL EXAM: Obese, in no acute distress. Neck: No JVD, HJR, Bruit, or thyroid enlargement  Lungs: No tachypnea, clear without wheezing, rales, or rhonchi  Cardiovascular: RRR, PMI not displaced, heart sounds normal, no murmurs, gallops, bruit, thrill, or heave.  Abdomen: BS normal. Soft without organomegaly, masses, lesions or tenderness.  Extremities: without cyanosis, clubbing or edema. Good distal pulses bilateral  SKin: Warm, no lesions or rashes   Musculoskeletal: No deformities  Neuro: no focal signs   Wt Readings from Last 3 Encounters:  12/02/13 92.534 kg (204 lb)  10/15/13 91.173 kg (201 lb)  10/02/13 93.804 kg (206 lb 12.8 oz)     EKG: Sinus tachycardia at 114 beats per minute otherwise no acute change

## 2013-12-02 NOTE — H&P (Signed)
      HPI: This is a 63-year-old female patient of Dr.Nishan who has history of coronary artery disease status post previous stent to the OM and LAD. She has Cypher stent to the LAD in 2005, Cypher stent to the distal circumflex and OM 2 branch and 2005 with balloon angioplasty for restenosis of the circumflex in 2006. Last cath in 08/2009 patent stents and no critical stenosis. She also has severe COPD followed by Dr. Wert and 60-79% right ICA stenosis supposed to have Dopplers every 6 months but it's been over a year.   Patient comes in today accompanied by her daughter because of recurrent syncope. Last week she got up from sitting and was getting dog food out of the refrigerator and collapsed losing control of her bladder. Her husband states that she was unconscious for about 2 minutes. She fractured her lower back and is supposed to see an orthopedic doctor this week. She  Is also being  worked up by a neurologist. All this occurred at [place] [facility]. She states she's been passing out for over a year. She doesn't get dizzy when she first stands up but after she starts walking she gets dizzy. She has significant orthostasis in the office today. She is in significant pain from her back. She is also tachycardic.    When speaking with the patient she complains of daily chest tightness and pressure associated with shortness of breath. She has not used nitroglycerin for this. She says it happens at least 2 or 3 times a day. She says she hasn't told anyone about it because she went through breast cancer treatment in May with lumpectomy and radiation and hasn't had time.   No Known Allergies     Current Outpatient Prescriptions   Medication  Sig  Dispense  Refill   .  albuterol (PROAIR HFA) 108 (90 BASE) MCG/ACT inhaler  Inhale 2 puffs into the lungs every 4 (four) hours as needed for wheezing or shortness of breath.          .  aspirin 81 MG tablet  Take 81 mg by mouth at bedtime.          .  atorvastatin (LIPITOR) 40 MG tablet  Take 40 mg by mouth at bedtime.         .  azithromycin (ZITHROMAX) 250 MG tablet  Take 2 on day one then 1 daily x 4 days   6 tablet   0   .  calcium carbonate (OS-CAL) 600 MG TABS  Take 600 mg by mouth daily.           .  carvedilol (COREG) 3.125 MG tablet  Take 1 tablet (3.125 mg total) by mouth 2 (two) times daily with a meal.   60 tablet   3   .  Cholecalciferol (VITAMIN D) 2000 UNITS tablet  Take 2,000 Units by mouth 2 (two) times daily at 8 am and 10 pm. Taking one cap. Two times daily         .  clopidogrel (PLAVIX) 75 MG tablet  Take 75 mg by mouth daily.           .  DULoxetine (CYMBALTA) 60 MG capsule  Take 60 mg by mouth 2 (two) times daily.         .  gabapentin (NEURONTIN) 300 MG capsule  2 capsules by mouth at bedtime         .  HYDROcodone-acetaminophen (NORCO/VICODIN) 5-325 MG per tablet    Take 1 tablet by mouth every 6 (six) hours as needed for moderate pain.         .  letrozole (FEMARA) 2.5 MG tablet  Take 2.5 mg by mouth daily.         .  Multiple Vitamin (MULTIVITAMIN) tablet  Take 1 tablet by mouth daily.         .  niacin (NIASPAN) 1000 MG CR tablet  2 tabs by mouth at bedtime         .  nitroGLYCERIN (NITROSTAT) 0.4 MG SL tablet  Place 0.4 mg under the tongue every 5 (five) minutes as needed for chest pain (may repeat x3).          .  nystatin cream (MYCOSTATIN)  Apply 1 application topically 2 (two) times daily as needed (rash).         .  omeprazole (PRILOSEC) 40 MG capsule  Take 40 mg by mouth daily.         .  predniSONE (DELTASONE) 10 MG tablet  Take  4 each am x 2 days,   2 each am x 2 days,  1 each am x 2 days and stop   14 tablet   0   .  tiotropium (SPIRIVA) 18 MCG inhalation capsule  Place 1 capsule (18 mcg total) into inhaler and inhale daily.   30 capsule   6       No current facility-administered medications for this visit.         Past Medical  History   Diagnosis  Date   .  Chest pain     .  HTN (hypertension)     .  TIA (transient ischemic attack)     .  CAD (coronary artery disease)          Prev seen by [facility].  Stent to OM and LAD Last cath 6/11 cathed on June 13, [name]. 2011.  The cardiac catheterization showed 30% and 50% lesions in the mid  and distal LAD, 50% in the OM1, 40% in the OM2 with 20% in-stent  restenosis, 40% in-stent restenosis in the circumflex and no significant  disease in the RCA.  Her EF was normal.  Dr. [name] recommended a trial  of proton pump inhibitors    .  PVD (peripheral vascular disease)     .  Abnormal chest xray     .  Vertigo     .  Lymphadenitis     .  Pharyngitis     .  Sinusitis     .  Insomnia     .  Cyanosis     .  Tobacco abuse     .  IBS (irritable bowel syndrome)     .  Diarrhea     .  Hemorrhoids     .  DDD (degenerative disc disease)     .  Alcohol abuse     .  PUD (peptic ulcer disease)     .  Back pain     .  GERD (gastroesophageal reflux disease)     .  Diverticular disease     .  Depression     .  COPD (chronic obstructive pulmonary disease)     .  H/O: hysterectomy           Past Surgical History   Procedure  Laterality  Date   .  Appendectomy       .  Tubal ligation       .    Cardiac catheterization    2005, 2006, 2009,2011   .  Coronary stent placement           Status post balloon angiogram plasty and Cypher stent to the distal    circumflex and OM2 branch   .  Left bunionectomy       .  Hammertoe repair             Family History   Problem  Relation  Age of Onset   .  Coronary artery disease       .  Emphysema  Father     .  Asthma  Sister         2 sisters   .  Heart disease  Mother     .  Heart disease  Father     .  Rheumatologic disease  Mother     .  Cancer - Other  Mother         Uterine   .  Cancer - Other  Father     .  Stroke  Sister           History       Social History   .  Marital Status:  Legally Separated       Spouse  Name:  N/A       Number of Children:  N/A   .  Years of Education:  N/A       Occupational History   .  Not on file.       Social History Main Topics   .  Smoking status:  Former Smoker -- 2.00 packs/day for 52 years       Types:  Cigarettes       Start date:  01/16/1961       Quit date:  10/19/2012   .  Smokeless tobacco:  Not on file   .  Alcohol Use:  No   .  Drug Use:  Yes         Comment: Seldom Marijuana use- 1Xmonth   .  Sexual Activity:  Not on file       Other Topics  Concern   .  Not on file       Social History Narrative   .  No narrative on file        ROS: See HPI Eyes: Negative Ears:Negative for hearing loss, tinnitus Cardiovascular: Positive for chest pain, negative palpitations,irregular heartbeat, edema, claudication, cyanosis,.  Respiratory:   Chronic dyspnea on exertion and wheezing followed by Dr. Wert   Endocrine: Negative for cold intolerance and heat intolerance.  Hematologic/Lymphatic: Negative for adenopathy and bleeding problem. Does not bruise/bleed easily.  Musculoskeletal: Negative.   Gastrointestinal: Negative for nausea, vomiting, reflux, abdominal pain, diarrhea, constipation.   Genitourinary: Negative for bladder incontinence, dysuria, flank pain, frequency, hematuria, hesitancy, nocturia and urgency.  Neurological: Negative.   Allergic/Immunologic: Negative for environmental allergies.     BP 120/68  Pulse 114  Ht 5' 6" (1.676 m)  Wt 92.534 kg (204 lb)  BMI 32.94 kg/m2 see orthostatics   PHYSICAL EXAM: Obese, in no acute distress. Neck: No JVD, HJR, Bruit, or thyroid enlargement   Lungs: No tachypnea, clear without wheezing, rales, or rhonchi   Cardiovascular: RRR, PMI not displaced, heart sounds normal, no murmurs, gallops, bruit, thrill, or heave.   Abdomen: BS normal. Soft without organomegaly, masses, lesions or tenderness.   Extremities: without cyanosis, clubbing or edema. Good distal pulses  bilateral     SKin: Warm, no lesions or rashes    Musculoskeletal: No deformities   Neuro: no focal signs      Wt Readings from Last 3 Encounters:   12/02/13  92.534 kg (204 lb)   10/15/13  91.173 kg (201 lb)   10/02/13  93.804 kg (206 lb 12.8 oz)          EKG: Sinus tachycardia at 114 beats per minute otherwise no acute change            Syncope - Michele M Lenze, PA-C at 12/02/2013 11:01 AM      Status: Written Related Problem: Syncope    Patient has had recurrent syncope with collapse and loss of bladder control recently. She is orthostatic. She also has daily chest pain and significant history of CAD. She also has significant carotid disease. She is tachycardic in the office but it will not increase her carvedilol because of orthostasis. Discussed this patient in detail with Dr. Nelson who agrees she should be admitted for cardiac catheterization. We will check carotid Dopplers today in the office. Patient is agreeable.         HYPERTENSION - Michele M Lenze, PA-C at 12/02/2013 11:01 AM      Status: Written Related Problem: HYPERTENSION    Patient's blood pressure is elevated but she is orthostatic as well.         CHEST PAIN - Michele M Lenze, PA-C at 12/02/2013 11:03 AM      Status: Written Related Problem: CHEST PAIN    Patient is having 2-3 episodes of chest tightness and pressure consistent with her prior angina. Because of this we will admit her for cardiac catheterization. She has history of stent to the LAD in 2005, stent to the distal circumflex and no attempt to branch in 2005 with balloon angioplasty for restenosis in the circumflex in 2006. Last cath in 2011 patent stents no critical stenosis.         Right carotid bruit - Michele M Lenze, PA-C at 12/02/2013 11:03 AM      Status: Edited Related Problem: Right carotid bruit    60-79% stenosis in 11/2012, Recheck carotids today.      Revision History       Date/Time User Action      > 12/02/2013 11:03 AM Michele  M Lenze, PA-C Edit        12/02/2013 11:03 AM Michele M Lenze, PA-C Create                  TOBACCO ABUSE - Michele M Lenze, PA-C at 12/02/2013 11:03 AM      Status: Written Related Problem: TOBACCO ABUSE    Patient quit smoking           

## 2013-12-02 NOTE — Patient Instructions (Addendum)
Your physician has requested that you have a cardiac catheterization 12/03/13 at 1:30 pm. Cardiac catheterization is used to diagnose and/or treat various heart conditions. Doctors may recommend this procedure for a number of different reasons. The most common reason is to evaluate chest pain. Chest pain can be a symptom of coronary artery disease (CAD), and cardiac catheterization can show whether plaque is narrowing or blocking your heart's arteries. This procedure is also used to evaluate the valves, as well as measure the blood flow and oxygen levels in different parts of your heart. For further information please visit [email or web address] Please follow instruction sheet, as given.  Your physician has requested that you have a carotid doppler today (already scheduled ) This test is an ultrasound of the carotid arteries in your neck. It looks at blood flow through these arteries that supply the brain with blood. Allow one hour for this exam. There are no restrictions or special instructions.  Your physician recommends that you have  lab work today (STAT) CBC,INR.BMET  Your physician recommends that you continue on your current medications as directed. Please refer to the Current Medication list given to you today.

## 2013-12-03 ENCOUNTER — Encounter (HOSPITAL_COMMUNITY)
Admission: RE | Disposition: A | Discharge status: Home / Self Care | Payer: Self-pay | Source: Ambulatory Visit | Attending: Cardiology

## 2013-12-03 ENCOUNTER — Ambulatory Visit (HOSPITAL_COMMUNITY)
Admission: RE | Admit: 2013-12-03 | Discharge: 2013-12-04 | Disposition: A | Discharge status: Home / Self Care | Payer: Medicare Other | Source: Ambulatory Visit | Attending: Cardiology | Admitting: Cardiology

## 2013-12-03 ENCOUNTER — Encounter (HOSPITAL_COMMUNITY): Payer: Self-pay | Admitting: *Deleted

## 2013-12-03 DIAGNOSIS — Z8679 Personal history of other diseases of the circulatory system: Secondary | ICD-10-CM

## 2013-12-03 DIAGNOSIS — F17210 Nicotine dependence, cigarettes, uncomplicated: Secondary | ICD-10-CM | POA: Diagnosis present

## 2013-12-03 DIAGNOSIS — R42 Dizziness and giddiness: Secondary | ICD-10-CM

## 2013-12-03 DIAGNOSIS — R079 Chest pain, unspecified: Secondary | ICD-10-CM | POA: Diagnosis present

## 2013-12-03 DIAGNOSIS — I739 Peripheral vascular disease, unspecified: Secondary | ICD-10-CM | POA: Insufficient documentation

## 2013-12-03 DIAGNOSIS — F1010 Alcohol abuse, uncomplicated: Secondary | ICD-10-CM | POA: Insufficient documentation

## 2013-12-03 DIAGNOSIS — I209 Angina pectoris, unspecified: Secondary | ICD-10-CM | POA: Insufficient documentation

## 2013-12-03 DIAGNOSIS — Y849 Medical procedure, unspecified as the cause of abnormal reaction of the patient, or of later complication, without mention of misadventure at the time of the procedure: Secondary | ICD-10-CM | POA: Diagnosis not present

## 2013-12-03 DIAGNOSIS — I6529 Occlusion and stenosis of unspecified carotid artery: Secondary | ICD-10-CM | POA: Diagnosis not present

## 2013-12-03 DIAGNOSIS — E785 Hyperlipidemia, unspecified: Secondary | ICD-10-CM | POA: Diagnosis not present

## 2013-12-03 DIAGNOSIS — I2510 Atherosclerotic heart disease of native coronary artery without angina pectoris: Secondary | ICD-10-CM

## 2013-12-03 DIAGNOSIS — F329 Major depressive disorder, single episode, unspecified: Secondary | ICD-10-CM | POA: Insufficient documentation

## 2013-12-03 DIAGNOSIS — J449 Chronic obstructive pulmonary disease, unspecified: Secondary | ICD-10-CM | POA: Insufficient documentation

## 2013-12-03 DIAGNOSIS — R55 Syncope and collapse: Secondary | ICD-10-CM

## 2013-12-03 DIAGNOSIS — J4489 Other specified chronic obstructive pulmonary disease: Secondary | ICD-10-CM | POA: Insufficient documentation

## 2013-12-03 DIAGNOSIS — Z9861 Coronary angioplasty status: Secondary | ICD-10-CM | POA: Diagnosis not present

## 2013-12-03 DIAGNOSIS — I25119 Atherosclerotic heart disease of native coronary artery with unspecified angina pectoris: Secondary | ICD-10-CM | POA: Diagnosis present

## 2013-12-03 DIAGNOSIS — Z8673 Personal history of transient ischemic attack (TIA), and cerebral infarction without residual deficits: Secondary | ICD-10-CM | POA: Insufficient documentation

## 2013-12-03 DIAGNOSIS — G4700 Insomnia, unspecified: Secondary | ICD-10-CM | POA: Diagnosis not present

## 2013-12-03 DIAGNOSIS — Z6832 Body mass index (BMI) 32.0-32.9, adult: Secondary | ICD-10-CM | POA: Insufficient documentation

## 2013-12-03 DIAGNOSIS — R0600 Dyspnea, unspecified: Secondary | ICD-10-CM

## 2013-12-03 DIAGNOSIS — Z87891 Personal history of nicotine dependence: Secondary | ICD-10-CM | POA: Diagnosis not present

## 2013-12-03 DIAGNOSIS — E669 Obesity, unspecified: Secondary | ICD-10-CM | POA: Insufficient documentation

## 2013-12-03 DIAGNOSIS — K219 Gastro-esophageal reflux disease without esophagitis: Secondary | ICD-10-CM | POA: Insufficient documentation

## 2013-12-03 DIAGNOSIS — I10 Essential (primary) hypertension: Secondary | ICD-10-CM | POA: Diagnosis not present

## 2013-12-03 DIAGNOSIS — F3289 Other specified depressive episodes: Secondary | ICD-10-CM | POA: Diagnosis not present

## 2013-12-03 DIAGNOSIS — T82855A Stenosis of coronary artery stent, initial encounter: Secondary | ICD-10-CM

## 2013-12-03 HISTORY — PX: LEFT HEART CATHETERIZATION WITH CORONARY ANGIOGRAM: SHX5451

## 2013-12-03 LAB — POCT ACTIVATED CLOTTING TIME
ACTIVATED CLOTTING TIME: 354 s
Activated Clotting Time: 236 seconds
Activated Clotting Time: 157 seconds

## 2013-12-03 SURGERY — LEFT HEART CATHETERIZATION WITH CORONARY ANGIOGRAM
Anesthesia: LOCAL

## 2013-12-03 MED ORDER — HYDROCODONE-ACETAMINOPHEN 5-325 MG PO TABS
1.0000 | ORAL_TABLET | Freq: Four times a day (QID) | ORAL | Status: DC | PRN
  Administered 2013-12-03: 19:00:00 1 via ORAL
  Filled 2013-12-03: qty 1

## 2013-12-03 MED ORDER — SODIUM CHLORIDE 0.9 % IJ SOLN: 3.0000 mL | Freq: Two times a day (BID) | INTRAMUSCULAR | Status: DC

## 2013-12-03 MED ORDER — SODIUM CHLORIDE 0.9 % IV SOLN
250.0000 mL | INTRAVENOUS | Status: DC | PRN
Start: 2013-12-03 — End: 2013-12-03

## 2013-12-03 MED ORDER — ATORVASTATIN CALCIUM 40 MG PO TABS
40.0000 mg | ORAL_TABLET | Freq: Every day | ORAL | Status: DC
  Administered 2013-12-03: 21:00:00 40 mg via ORAL
  Filled 2013-12-03 (×2): qty 1

## 2013-12-03 MED ORDER — FENTANYL CITRATE 0.05 MG/ML IJ SOLN
INTRAMUSCULAR | Status: AC
  Filled 2013-12-03: qty 2

## 2013-12-03 MED ORDER — NITROGLYCERIN 0.4 MG SL SUBL: 0.4000 mg | SUBLINGUAL_TABLET | SUBLINGUAL | Status: DC | PRN

## 2013-12-03 MED ORDER — ACETAMINOPHEN 325 MG PO TABS: 650.0000 mg | ORAL_TABLET | ORAL | Status: DC | PRN

## 2013-12-03 MED ORDER — ASPIRIN 81 MG PO CHEW
CHEWABLE_TABLET | ORAL | Status: AC
  Administered 2013-12-03: 81 mg via ORAL
  Filled 2013-12-03: qty 1

## 2013-12-03 MED ORDER — ONDANSETRON HCL 4 MG/2ML IJ SOLN: 4.0000 mg | Freq: Four times a day (QID) | INTRAMUSCULAR | Status: DC | PRN

## 2013-12-03 MED ORDER — PANTOPRAZOLE SODIUM 40 MG PO TBEC
40.0000 mg | DELAYED_RELEASE_TABLET | Freq: Every day | ORAL | Status: DC
Start: 2013-12-03 — End: 2013-12-04
  Administered 2013-12-03 – 2013-12-04 (×2): 40 mg via ORAL
  Filled 2013-12-03 (×2): qty 1

## 2013-12-03 MED ORDER — NITROGLYCERIN 1 MG/10 ML FOR IR/CATH LAB
INTRA_ARTERIAL | Status: AC
  Filled 2013-12-03: qty 10

## 2013-12-03 MED ORDER — CLOPIDOGREL BISULFATE 75 MG PO TABS
75.0000 mg | ORAL_TABLET | Freq: Every day | ORAL | Status: DC
  Administered 2013-12-04: 75 mg via ORAL
  Filled 2013-12-03: qty 1

## 2013-12-03 MED ORDER — LIDOCAINE HCL (PF) 1 % IJ SOLN
INTRAMUSCULAR | Status: AC
  Filled 2013-12-03: qty 30

## 2013-12-03 MED ORDER — ASPIRIN 81 MG PO CHEW
81.0000 mg | CHEWABLE_TABLET | ORAL | Status: AC
  Administered 2013-12-03: 81 mg via ORAL

## 2013-12-03 MED ORDER — SODIUM CHLORIDE 0.9 % IJ SOLN: 3.0000 mL | INTRAMUSCULAR | Status: DC | PRN

## 2013-12-03 MED ORDER — GABAPENTIN 300 MG PO CAPS
600.0000 mg | ORAL_CAPSULE | Freq: Every day | ORAL | Status: DC
  Administered 2013-12-03: 21:00:00 600 mg via ORAL
  Filled 2013-12-03 (×2): qty 2

## 2013-12-03 MED ORDER — MIDAZOLAM HCL 2 MG/2ML IJ SOLN
INTRAMUSCULAR | Status: AC
Start: 2013-12-03 — End: 2013-12-03
  Filled 2013-12-03: qty 2

## 2013-12-03 MED ORDER — CLOPIDOGREL BISULFATE 75 MG PO TABS: 75.0000 mg | ORAL_TABLET | Freq: Every day | ORAL | Status: DC

## 2013-12-03 MED ORDER — DIAZEPAM 5 MG PO TABS
5.0000 mg | ORAL_TABLET | Freq: Four times a day (QID) | ORAL | Status: DC | PRN
  Administered 2013-12-03: 20:00:00 5 mg via ORAL
  Filled 2013-12-03: qty 1

## 2013-12-03 MED ORDER — ASPIRIN 81 MG PO CHEW
81.0000 mg | CHEWABLE_TABLET | Freq: Every day | ORAL | Status: DC
  Administered 2013-12-03: 21:00:00 81 mg via ORAL
  Filled 2013-12-03 (×2): qty 1

## 2013-12-03 MED ORDER — HEPARIN SODIUM (PORCINE) 1000 UNIT/ML IJ SOLN
INTRAMUSCULAR | Status: AC
Start: 2013-12-03 — End: 2013-12-03
  Filled 2013-12-03: qty 1

## 2013-12-03 MED ORDER — LETROZOLE 2.5 MG PO TABS
2.5000 mg | ORAL_TABLET | Freq: Every day | ORAL | Status: DC
  Administered 2013-12-03 – 2013-12-04 (×2): 2.5 mg via ORAL
  Filled 2013-12-03 (×2): qty 1

## 2013-12-03 MED ORDER — SODIUM CHLORIDE 0.9 % IV SOLN
INTRAVENOUS | Status: DC
  Administered 2013-12-03: 12:00:00 via INTRAVENOUS

## 2013-12-03 MED ORDER — SODIUM CHLORIDE 0.9 % IV SOLN: 250.0000 mL | INTRAVENOUS | Status: DC | PRN

## 2013-12-03 MED ORDER — SODIUM CHLORIDE 0.9 % IV SOLN
1.0000 mL/kg/h | INTRAVENOUS | Status: AC
  Administered 2013-12-03: 18:00:00 1 mL/kg/h via INTRAVENOUS

## 2013-12-03 MED ORDER — CLOPIDOGREL BISULFATE 300 MG PO TABS
ORAL_TABLET | ORAL | Status: AC
Start: 2013-12-03 — End: 2013-12-03
  Filled 2013-12-03: qty 1

## 2013-12-03 MED ORDER — TIOTROPIUM BROMIDE MONOHYDRATE 18 MCG IN CAPS
18.0000 ug | ORAL_CAPSULE | Freq: Every day | RESPIRATORY_TRACT | Status: DC
  Administered 2013-12-04: 18 ug via RESPIRATORY_TRACT
  Filled 2013-12-03: qty 5

## 2013-12-03 MED ORDER — MIDAZOLAM HCL 2 MG/2ML IJ SOLN
INTRAMUSCULAR | Status: AC
  Filled 2013-12-03: qty 2

## 2013-12-03 MED ORDER — DULOXETINE HCL 60 MG PO CPEP
60.0000 mg | ORAL_CAPSULE | Freq: Two times a day (BID) | ORAL | Status: DC
  Administered 2013-12-03 – 2013-12-04 (×2): 60 mg via ORAL
  Filled 2013-12-03 (×3): qty 1

## 2013-12-03 MED ORDER — CARVEDILOL 3.125 MG PO TABS
3.1250 mg | ORAL_TABLET | Freq: Two times a day (BID) | ORAL | Status: DC
  Administered 2013-12-04: 08:00:00 3.125 mg via ORAL
  Filled 2013-12-03 (×3): qty 1

## 2013-12-03 MED ORDER — HEPARIN (PORCINE) IN NACL 2-0.9 UNIT/ML-% IJ SOLN
INTRAMUSCULAR | Status: AC
  Filled 2013-12-03: qty 1000

## 2013-12-03 MED ORDER — ALBUTEROL SULFATE (2.5 MG/3ML) 0.083% IN NEBU: 2.0000 mL | INHALATION_SOLUTION | RESPIRATORY_TRACT | Status: DC | PRN

## 2013-12-03 MED ORDER — MORPHINE SULFATE 2 MG/ML IJ SOLN
2.0000 mg | INTRAMUSCULAR | Status: DC | PRN
  Administered 2013-12-03 (×2): 2 mg via INTRAVENOUS
  Filled 2013-12-03 (×2): qty 1

## 2013-12-03 NOTE — H&P (View-Only) (Signed)
HPI: This is a 63 year old female patient of Dr.Nishan who has history of coronary artery disease status post previous stent to the OM and LAD. She has Cypher stent to the LAD in 2005, Cypher stent to the distal circumflex and OM 2 branch and 2005 with balloon angioplasty for restenosis of the circumflex in 2006. Last cath in 08/2009 patent stents and no critical stenosis. She also has severe COPD followed by Dr. Sherene Sires and 60-79% right ICA stenosis supposed to have Dopplers every 6 months but it's been over a year.   Patient comes in today accompanied by her daughter because of recurrent syncope. Last week she got up from sitting and was getting dog food out of the refrigerator and collapsed losing control of her bladder. Her husband states that she was unconscious for about 2 minutes. She fractured her lower back and is supposed to see an orthopedic doctor this week. She  Is also being  worked up by a Insurance account manager. All this occurred at [facility]. She states she's been passing out for over a year. She doesn't get dizzy when she first stands up but after she starts walking she gets dizzy. She has significant orthostasis in the office today. She is in significant pain from her back. She is also tachycardic.    When speaking with the patient she complains of daily chest tightness and pressure associated with shortness of breath. She has not used nitroglycerin for this. She says it happens at least 2 or 3 times a day. She says she hasn't told anyone about it because she went through breast cancer treatment in May with lumpectomy and radiation and hasn't had time.   No Known Allergies     Current Outpatient Prescriptions   Medication  Sig  Dispense  Refill   .  albuterol (PROAIR HFA) 108 (90 BASE) MCG/ACT inhaler  Inhale 2 puffs into the lungs every 4 (four) hours as needed for wheezing or shortness of breath.          [name]  aspirin 81 MG tablet  Take 81 mg by mouth at bedtime.          [name]  atorvastatin (LIPITOR) 40 MG tablet  Take 40 mg by mouth at bedtime.         [name]  azithromycin (ZITHROMAX) 250 MG tablet  Take 2 on day one then 1 daily x 4 days   6 tablet   0   .  calcium carbonate (OS-CAL) 600 MG TABS  Take 600 mg by mouth daily.           .  carvedilol (COREG) 3.125 MG tablet  Take 1 tablet (3.125 mg total) by mouth 2 (two) times daily with a meal.   60 tablet   3   .  Cholecalciferol (VITAMIN D) 2000 UNITS tablet  Take 2,000 Units by mouth 2 (two) times daily at 8 am and 10 pm. Taking one cap. Two times daily         .  clopidogrel (PLAVIX) 75 MG tablet  Take 75 mg by mouth daily.           .  DULoxetine (CYMBALTA) 60 MG capsule  Take 60 mg by mouth 2 (two) times daily.         [name]  gabapentin (NEURONTIN) 300 MG capsule  2 capsules by mouth at bedtime         .  HYDROcodone-acetaminophen (NORCO/VICODIN) 5-325 MG per tablet  Take 1 tablet by mouth every 6 (six) hours as needed for moderate pain.         [name]  letrozole (FEMARA) 2.5 MG tablet  Take 2.5 mg by mouth daily.         .  Multiple Vitamin (MULTIVITAMIN) tablet  Take 1 tablet by mouth daily.         .  niacin (NIASPAN) 1000 MG CR tablet  2 tabs by mouth at bedtime         .  nitroGLYCERIN (NITROSTAT) 0.4 MG SL tablet  Place 0.4 mg under the tongue every 5 (five) minutes as needed for chest pain (may repeat x3).          .  nystatin cream (MYCOSTATIN)  Apply 1 application topically 2 (two) times daily as needed (rash).         [name]  omeprazole (PRILOSEC) 40 MG capsule  Take 40 mg by mouth daily.         .  predniSONE (DELTASONE) 10 MG tablet  Take  4 each am x 2 days,   2 each am x 2 days,  1 each am x 2 days and stop   14 tablet   0   .  tiotropium (SPIRIVA) 18 MCG inhalation capsule  Place 1 capsule (18 mcg total) into inhaler and inhale daily.   30 capsule   6       No current facility-administered medications for this visit.         Past Medical  History   Diagnosis  Date   .  Chest pain     .  HTN (hypertension)     .  TIA (transient ischemic attack)     .  CAD (coronary artery disease)          Prev seen by [facility].  Stent to OM and LAD Last cath 6/11 cathed on June 13, [name]. 2011.  The cardiac catheterization showed 30% and 50% lesions in the mid  and distal LAD, 50% in the OM1, 40% in the OM2 with 20% in-stent  restenosis, 40% in-stent restenosis in the circumflex and no significant  disease in the RCA.  Her EF was normal.  Dr. Juanda Chance recommended a trial  of proton pump inhibitors    .  PVD (peripheral vascular disease)     .  Abnormal chest xray     .  Vertigo     .  Lymphadenitis     .  Pharyngitis     .  Sinusitis     .  Insomnia     .  Cyanosis     .  Tobacco abuse     .  IBS (irritable bowel syndrome)     .  Diarrhea     .  Hemorrhoids     .  DDD (degenerative disc disease)     .  Alcohol abuse     .  PUD (peptic ulcer disease)     .  Back pain     .  GERD (gastroesophageal reflux disease)     .  Diverticular disease     .  Depression     .  COPD (chronic obstructive pulmonary disease)     .  H/O: hysterectomy           Past Surgical History   Procedure  Laterality  Date   .  Appendectomy       .  Tubal ligation       .  Cardiac catheterization    2005, 2006, 2009,2011   .  Coronary stent placement           Status post balloon angiogram plasty and Cypher stent to the distal    circumflex and OM2 branch   .  Left bunionectomy       .  Hammertoe repair             Family History   Problem  Relation  Age of Onset   .  Coronary artery disease       .  Emphysema  Father     .  Asthma  Sister         2 sisters   .  Heart disease  Mother     .  Heart disease  Father     .  Rheumatologic disease  Mother     .  Cancer - Other  Mother         Uterine   .  Cancer - Other  Father     .  Stroke  Sister           History       Social History   .  Marital Status:  Legally Separated       Spouse  Name:  N/A       Number of Children:  N/A   .  Years of Education:  N/A       Occupational History   .  Not on file.       Social History Main Topics   .  Smoking status:  Former Smoker -- 2.00 packs/day for 52 years       Types:  Cigarettes       Start date:  01/16/1961       Quit date:  10/19/2012   .  Smokeless tobacco:  Not on file   .  Alcohol Use:  No   .  Drug Use:  Yes         Comment: Seldom Marijuana use- 1Xmonth   .  Sexual Activity:  Not on file       Other Topics  Concern   .  Not on file       Social History Narrative   .  No narrative on file        ROS: See HPI Eyes: Negative Ears:Negative for hearing loss, tinnitus Cardiovascular: Positive for chest pain, negative palpitations,irregular heartbeat, edema, claudication, cyanosis,.  Respiratory:   Chronic dyspnea on exertion and wheezing followed by Dr. Sherene Sires   Endocrine: Negative for cold intolerance and heat intolerance.  Hematologic/Lymphatic: Negative for adenopathy and bleeding problem. Does not bruise/bleed easily.  Musculoskeletal: Negative.   Gastrointestinal: Negative for nausea, vomiting, reflux, abdominal pain, diarrhea, constipation.   Genitourinary: Negative for bladder incontinence, dysuria, flank pain, frequency, hematuria, hesitancy, nocturia and urgency.  Neurological: Negative.   Allergic/Immunologic: Negative for environmental allergies.     BP 120/68  Pulse 114  Ht  (1.676 m)  Wt 92.534 kg (204 lb)  BMI 32.94 kg/m2 see orthostatics   PHYSICAL EXAM: Obese, in no acute distress. Neck: No JVD, HJR, Bruit, or thyroid enlargement   Lungs: No tachypnea, clear without wheezing, rales, or rhonchi   Cardiovascular: RRR, PMI not displaced, heart sounds normal, no murmurs, gallops, bruit, thrill, or heave.   Abdomen: BS normal. Soft without organomegaly, masses, lesions or tenderness.   Extremities: without cyanosis, clubbing or edema. Good distal pulses  bilateral  SKin: Warm, no lesions or rashes    Musculoskeletal: No deformities   Neuro: no focal signs      Wt Readings from Last 3 Encounters:   12/02/13  92.534 kg (204 lb)   10/15/13  91.173 kg (201 lb)   10/02/13  93.804 kg (206 lb 12.8 oz)          EKG: Sinus tachycardia at 114 beats per minute otherwise no acute change            Syncope - Dyann Kief, PA-C at 12/02/2013 11:01 AM      Status: Written Related Problem: Syncope    Patient has had recurrent syncope with collapse and loss of bladder control recently. She is orthostatic. She also has daily chest pain and significant history of CAD. She also has significant carotid disease. She is tachycardic in the office but it will not increase her carvedilol because of orthostasis. Discussed this patient in detail with Dr. Delton See who agrees she should be admitted for cardiac catheterization. We will check carotid Dopplers today in the office. Patient is agreeable.         HYPERTENSION - Dyann Kief, PA-C at 12/02/2013 11:01 AM      Status: Written Related Problem: HYPERTENSION    Patient's blood pressure is elevated but she is orthostatic as well.         CHEST PAIN - Dyann Kief, PA-C at 12/02/2013 11:03 AM      Status: Written Related Problem: CHEST PAIN    Patient is having 2-3 episodes of chest tightness and pressure consistent with her prior angina. Because of this we will admit her for cardiac catheterization. She has history of stent to the LAD in 2005, stent to the distal circumflex and no attempt to branch in 2005 with balloon angioplasty for restenosis in the circumflex in 2006. Last cath in 2011 patent stents no critical stenosis.         Right carotid bruit - Dyann Kief, PA-C at 12/02/2013 11:03 AM      Status: Linus Orn Related Problem: Right carotid bruit    60-79% stenosis in 11/2012, Recheck carotids today.      Revision History       Date/Time User Action      > 12/02/2013 11:03 AM Dyann Kief, PA-C Edit        12/02/2013 11:03 AM Dyann Kief, PA-C Create                  TOBACCO ABUSE - Dyann Kief, PA-C at 12/02/2013 11:03 AM      Status: Written Related Problem: TOBACCO ABUSE    Patient quit smoking

## 2013-12-03 NOTE — Interval H&P Note (Signed)
History and Physical Interval Note:  12/03/2013 3:34 PM  Patricia Wall  has presented today for surgery, with the diagnosis of Class 3-4 Angina As Well as recurrent syncope.  Symptoms describing clinic note has daily chest tightness and pressure associated with shortness of breath in a patient with known coronary disease status post PCI. Principal Problem:   Angina, class III Active Problems:   HYPERLIPIDEMIA   TOBACCO ABUSE   HYPERTENSION   TRANSIENT ISCHEMIC ATTACK, HX OF    The various methods of treatment have been discussed with the patient and family. After consideration of risks, benefits and other options for treatment, the patient has consented to  Procedure(s): LEFT HEART CATHETERIZATION WITH CORONARY ANGIOGRAM (N/A) as a surgical intervention .  The patient's history has been reviewed, patient examined, no change in status, stable for surgery.  I have reviewed the patient's chart and labs.  Questions were answered to the patient's satisfaction.     HARDING,DAVID W  Cath Lab Visit (complete for each Cath Lab visit)  Clinical Evaluation Leading to the Procedure:   ACS: No.  Non-ACS:    Anginal Classification: CCS III  Anti-ischemic medical therapy: Minimal Therapy (1 class of medications)  Non-Invasive Test Results: No non-invasive testing performed  Prior CABG: No previous CABG

## 2013-12-03 NOTE — Progress Notes (Signed)
Site area: right groin  Site Prior to Removal:  Level 0  Pressure Applied For 35 MINUTES    Minutes Beginning at 20:25  Manual:   Yes.    Patient Status During Pull:  PT [id number]  Post Pull Groin Site:  Level 0  Post Pull Instructions Given:  Yes.    Post Pull Pulses Present:  Yes.    Dressing Applied:  Yes.    Comments:  Pt tolerated removal of sheath without distress or complications.  Will continue to monitor patient.

## 2013-12-03 NOTE — CV Procedure (Signed)
CARDIAC CATHETERIZATION AND PERCUTANEOUS CORONARY INTERVENTION REPORT  NAME:  Patricia Wall   MRN: [id number] DOB:  03/13/1951   ADMIT DATE: 12/03/2013 Procedure Date: 12/03/2013  INTERVENTIONAL CARDIOLOGIST: Leonie Man, M.D., MS PRIMARY CARE PROVIDER: Saralyn Pilar PRIMARY CARDIOLOGIST: Jenkins Rouge, MD (Referrred by Estella Husk, PA-C)  PATIENT:  Patricia Wall is a 63 y.o. female with history of CAD status post PCI to the OM one, distal circumflex and LAD in 2005.  These were Cypher DES stents in the proximal LAD, proximal OM 2 and distal circumflex beyond OM 2 (the 2 stents in the circumflex distribution were 2.5 mm).  She had in-stent restenosis with balloon angioplasty of the circumflex 2006. He did on 11 cardiac catheterization revealed patent stents with no critical stenoses. He also had right internal carotid stenosis and COPD. She was in the clinic by Estella Husk, PA-C. for what about the recurrent syncope L. as daily chest tightness and pressure associated with dyspnea consistent with at least class III angina. This was at least 2 times a day and was occasionally happen with minimal exertion - bordering on class IV angina. Her single symptoms included an episode the previous week where she got up from sitting and went to the ureter where she collapsed and lost control of bowel and bladder. She was apparently unconscious for 2 minutes. She's been having intermittent passing out spells for over a year and is being evaluated by neurologist. She denies orthostatic symptoms.  Due to concern for possible class 3-4 angina in a patient with known CAD status post PCI, she was referred for invasive evaluation with cardiac catheterization plus minus PCI.  PRE-OPERATIVE DIAGNOSIS:    Class 3-4 Angina  Known CAD Status Post PCI with History of ISR  PROCEDURES PERFORMED:    Left Heart Catheterization with Native Coronary Angiography  via Right Common Femoral Artery   Left  Ventriculography  AngioSculpt Balloon PTCA of the ostial AV Groove Circumflex beyond OM 2 with 2.5 mm balloon  AngioSculpt Balloon PTCA followed by PCI with progress premier DES 2.5 mm x 12 mm -- postdilated to 2.7 mm  PROCEDURE: The patient was brought to the [facility]nd [facility] in the fasting state and prepped and draped in the usual sterile fashion for Right Common Femoral artery access. The patient has extreme multiple rolling tremors bilaterally which would make radial access impossible making femoral access the best and safest option.  Sterile technique was used including antiseptics, cap, gloves, gown, hand hygiene, mask and sheet. Skin prep: Chlorhexidine.   Consent: Risks of procedure as well as the alternatives and risks of each were explained to the (patient/caregiver). Consent for procedure obtained.   Time Out: Verified patient identification, verified procedure, site/side was marked, verified correct patient position, special equipment/implants available, medications/allergies/relevent history reviewed, required imaging and test results available. Performed.  Access:   Right Common Femoral Artery: 5 Fr Sheath -  fluoroscopically guided modified Seldinger Technique   Right Common Femoral Vein: 5 French sheath - Seldinger technique: Place due to infiltration of existing IV with plan for PCI  Left Heart Catheterization: 5 Fr Catheters advanced or exchanged over a standard J-wire; JL4 catheter advanced first.  Left Coronary Artery Cineangiography: JL4 Catheter  Right Coronary Artery Cineangiography: JR 4 Catheter   LV Hemodynamics (LV Gram): Angled pigtail  Sheaths will be removed in the postprocedure unit with manual pressure for hemostasis.   FINDINGS:  Hemodynamics:   Central Aortic Pressure /  Mean: 92/52/68 mmHg  Left Ventricular Pressure / LVEDP: 95/8/9 mmHg  Left Ventriculography:  EF: 65-70 %  Wall Motion: Hyperdynamic  Coronary  Anatomy:  Dominance: Left  Left Main: Large caliber vessel that bifurcates after a short segment into the LAD and Circumflex. Angiographically normal. LAD: Large-caliber, wraparound vessel is of very proximal stent with minimal roughly 20% distal stent I as far. Beyond the stented segment there are 2 diagonal branches. The LAD then tapers down around the apex refusing the distal third of the inferoapical wall. Beyond the stent there is tortuosity but no significant lesions.  D1: Small moderate caliber vessel with mild ostial/proximal disease. Otherwise angiographically normal.  D2: Moderate caliber major diagonal branch that comes off the mid vessel. Angiographically normal. Several branches. Left Circumflex: Large-caliber, dominant vessel with a pearly mid OM 1. The vessel then bifurcates into OM 2 and the following AV Groove Circumflex. The ostium of the AV groove branch has roughly 90% stenosis. Beyond this the AV groove circumflex stent has a focal/hazy appearing 95-99% proximal stent edge ISR.  The distal portion of the vessel has mild stenosis beyond the stent and then terminates as a small caliber PDA and 2 smaller caliber PL branches.  OM1: Moderate to large caliber vessel with mild luminal irregularities.  OM 2: Large-caliber vessel with a proximal stent that has distal 20-30% ISR. The remainder the vessel is free of significant disease it courses along the inferolateral wall up to the apex.   RCA: Small caliber, nondominant vessel with 2 artery marginal branches.    After reviewing the initial angiography, the culprit lesion was thought to be the combination of the Ostial AV Groove Circumflex and Distal AV Groove Circumflex lesions..  Preparation were made to proceed with PCI on these lesion. The plan was initially to perform AngioSculpt Balloon PTCA only on both sites with provisional DES PCI of the more distal lesion if needed.  Percutaneous Coronary Intervention:  Sheath exchanged  for 6 Fr Guide: 6 Fr   XB 3.5 Guidewire: Prowater  Lesion #1: Bifurcation AVG Circumflex-OM2 (ostial portion of AVG Cx)  TIMI 3 flow pre & post.  90% reduced to ~20% Predilation Balloon: Euphora 2.0 mm x 10 mm;   10 Atm x 30 Sec Second-dilation Balloon: AngioSculpt Balloon 2.5 mm x 10 mm;   10 Atm x 45 Sec,   Final Diameter: 2.5 mm with reduction of 90% stenosis to less than 20 % with no plaque shift into the larger OM 2.  2 lesions were worked on in sequence as the balloons were used for both lesions.   Lesion #2: Distal AVG Circumflex 95-99% ISR (at stent edge)    TIMI 3 flow pre & post.  95-99% reduced to 0%. Predilation Balloon: Euphora 2.0 mm x 10 mm;   6 Atm x 20 Sec (at the very beginning of the lesion)  10 Atm x 20 Sec - in the lesion  Post-dilation Balloon: AngioSculpt Balloon 2.5 mm x 10 mm  12 Atm x 45 Sec - 2 inflations with intention of not extending beyond the stent  Post inflation angiography revealed that there was plaque shift proximal to the stent with residual stenosis, therefore the decision was made to cover this area plus the in-stent stenosis area with a new Drug-Eluting Stent. Stent: Promus Premier DES 2.5 mm x 12 mm;   14 Atm x 30 Sec,   Postdilated the stent balloon: 16 Atm x 30 Sec  Final Diameter: ~2.7 mm  Post deployment  angiography in multiple views, with and without guidewire in place revealed excellent stent deployment and lesion coverage.  There was no evidence of dissection or perforation.  MEDICATIONS:  Anesthesia:  Local Lidocaine 20 ml  Sedation:  3 mg IV Versed, 50 mcg IV fentanyl ;   Omnipaque Contrast: 170 ml  Anticoagulation:  IV Heparin 6500 Units plus an additional 3000 Units - ACT>300 Sec  Anti-Platelet Agent:  Plavix 300 mg (she was on Plavix, but she was not sure if she took it this morning)  PATIENT DISPOSITION:    The patient was transferred to the PACU holding area in a hemodynamicaly stable, chest pain free  condition.  The patient tolerated the procedure well, and there were no complications.  EBL:   < 20 ml  The patient was stable before, during, and after the procedure.  POST-OPERATIVE DIAGNOSIS:    Severe disease in the circumflex with 2 separate lesions both treated with AngioSculpt PTCA or DES PCI.  Moderate ISR of the LAD and OM 2 stents, but otherwise minimal disease elsewhere.  Hyperdynamic LV with EDP  PLAN OF CARE:  Monitor overnight and post procedure unit (6C)  Continue home medications including aspirin plus Plavix  His stable morning anticipate discharge to followup with either Dr. Johnsie Cancel or Estella Husk, PA-C    Leonie Man, M.D., M.S. Interventional Cardiologist   Pager # [phone]

## 2013-12-04 DIAGNOSIS — I2510 Atherosclerotic heart disease of native coronary artery without angina pectoris: Secondary | ICD-10-CM | POA: Diagnosis not present

## 2013-12-04 DIAGNOSIS — Z9861 Coronary angioplasty status: Secondary | ICD-10-CM | POA: Diagnosis not present

## 2013-12-04 DIAGNOSIS — I209 Angina pectoris, unspecified: Secondary | ICD-10-CM | POA: Diagnosis not present

## 2013-12-04 DIAGNOSIS — E785 Hyperlipidemia, unspecified: Secondary | ICD-10-CM | POA: Diagnosis not present

## 2013-12-04 LAB — BASIC METABOLIC PANEL
Anion gap: 13 (ref 5–15)
BUN: 15 mg/dL (ref 6–23)
CHLORIDE: 103 meq/L (ref 96–112)
CO2: 23 mEq/L (ref 19–32)
CREATININE: 0.67 mg/dL (ref 0.50–1.10)
Calcium: 8 mg/dL — ABNORMAL LOW (ref 8.4–10.5)
GFR calc Af Amer: >90 mL/min (ref >90)
GFR calc non Af Amer: >90 mL/min (ref >90)
Glucose, Bld: 175 mg/dL — ABNORMAL HIGH (ref 70–99)
Potassium: 4.1 mEq/L (ref 3.7–5.3)
Sodium: 139 mEq/L (ref 137–147)

## 2013-12-04 LAB — CBC
HEMATOCRIT: 30.6 % — AB (ref 36.0–46.0)
HEMOGLOBIN: 10.2 g/dL — AB (ref 12.0–15.0)
MCH: 30 pg (ref 26.0–34.0)
MCHC: 33.3 g/dL (ref 30.0–36.0)
MCV: 90 fL (ref 78.0–100.0)
Platelets: 154 10*3/uL (ref 150–400)
RBC: 3.4 MIL/uL — AB (ref 3.87–5.11)
RDW: 15.1 % (ref 11.5–15.5)
WBC: 6 10*3/uL (ref 4.0–10.5)

## 2013-12-04 MED ORDER — MAGNESIUM HYDROXIDE 400 MG/5ML PO SUSP: 30.0000 mL | Freq: Every day | ORAL | Status: DC | PRN

## 2013-12-04 MED ORDER — ACETAMINOPHEN 325 MG PO TABS: 650.0000 mg | ORAL_TABLET | ORAL | Status: DC | PRN

## 2013-12-04 NOTE — Discharge Instructions (Signed)
Coronary Angiogram with Stent °Coronary angiography with stent placement is a procedure to widen or open a narrow blood vessel of the heart (coronary artery). When a coronary artery becomes partially blocked, it decreases blood flow to that area. This may lead to chest pain or a heart attack (myocardial infarction). Arteries may become blocked by cholesterol buildup (plaque) in the lining or wall.  °A stent is a small piece of metal that looks like a mesh or a spring. Stent placement may be done right after a coronary angiography in which a blocked artery is found or as a treatment for a heart attack.  °LET YOUR HEALTH CARE PROVIDER KNOW ABOUT: °· Any allergies you have.   °· All medicines you are taking, including vitamins, herbs, eye drops, creams, and over-the-counter medicines.   °· Previous problems you or members of your family have had with the use of anesthetics.   °· Any blood disorders you have.   °· Previous surgeries you have had.   °· Medical conditions you have. °RISKS AND COMPLICATIONS °Generally, coronary angiography with stent is a safe procedure. However, problems can occur and include: °· Damage to the heart or its blood vessels.   °· A return of blockage.   °· Bleeding, infection, or bruising at the insertion site.   °· A collection of blood under the skin (hematoma) at the insertion site. °· Blood clot in another part of the body.   °· Kidney injury.   °· Allergic reaction to the dye or contrast used.   °· Bleeding into the abdomen (retroperitoneal bleeding). °BEFORE THE PROCEDURE °· Do not eat or drink anything after midnight on the night before the procedure or as directed by your health care provider.  °· Ask your health care provider about changing or stopping your regular medicines. This is especially important if you are taking diabetes medicines or blood thinners. °· Your health care provider will make sure you understand the procedure as well as the risks and potential problems  associated with the procedure.   °PROCEDURE °· You may be given a medicine to help you relax before and during the procedure (sedative). This medicine will be given through an IV tube that is put into one of your veins.   °· The area where the catheter will be inserted will be shaved and cleaned. This is usually done in the groin but may be done in the fold of your arm (near your elbow) or in the wrist.    °· A medicine will be given to numb the area where the catheter will be inserted (local anesthetic).   °· The catheter will be inserted into an artery using a guide wire. A type of X-ray (fluoroscopy) will be used to help guide the catheter to the opening of the blocked artery.   °· A dye will then be injected into the catheter, and X-rays will be taken. The dye will help to show where any narrowing or blockages are located in the heart arteries.   °· A tiny wire will be guided to the blocked spot, and a balloon will be inflated to make the artery wider. The stent will be expanded and will crush the plaque into the wall of the vessel. The stent will hold the area open like a scaffolding and improve the blood flow.   °· Sometimes the artery may be made wider using a laser or other tools to remove plaque.   °· When the blood flow is better, the catheter will be removed. The lining of the artery will grow over the stent, which stays where it was placed.   °  AFTER THE PROCEDURE °· If the procedure is done through the leg, you will be kept in bed lying flat for about 6 hours. You will be instructed to not bend or cross your legs.   °· The insertion site will be checked frequently.   °· The pulse in your feet or wrist will be checked frequently.   °· Additional blood tests, X-rays, and electrocardiography may be done. °Document Released: 09/11/2002 Document Revised: 07/22/2013 Document Reviewed: 09/13/2012 °[employer]® Patient Information ©2015 [employer]. This information is not intended to replace advice given to you  by your health care provider. Make sure you discuss any questions you have with your health care provider. ° °

## 2013-12-04 NOTE — Discharge Summary (Signed)
The above note is accurate and has been edited by me. The patient is stable and ready for discharge.

## 2013-12-04 NOTE — Progress Notes (Signed)
CARDIAC REHAB PHASE I   PRE:  Rate/Rhythm: 105 ST    BP: sitting 133/79, standing 96/67, recheck 94/47    SaO2:   MODE:  Ambulation: 260 ft   POST:  Rate/Rhythm: 124 ST    BP: sitting 135/70     SaO2:   Pt sts she feels well besides back pain. BP dropped with standing, no dizziness. Back up after walking. HR elevated, 124 ST at the most. Pt sts this is typical for her and "they don't know why". Denied CP. Discussed ed. Did not give ex gl due to back fx and need for orthopedist.  [phone]  Harriet Masson CES, ACSM 12/04/2013 8:57 AM

## 2013-12-04 NOTE — Progress Notes (Signed)
See discharge summary. Patient ambulated wih me with no trouble. Her right groin is without hematoma.

## 2013-12-04 NOTE — Progress Notes (Signed)
    Subjective:  She says she feels better today.  Objective:  Vital Signs in the last 24 hours: Temp:  [97.7 F (36.5 C)-98.3 F (36.8 C)] 97.9 F (36.6 C) (09/16 0603) Pulse Rate:  [90-140] 90 (09/16 0603) Resp:  [16-20] 20 (09/16 0603) BP: (96-126)/(49-74) 96/49 mmHg (09/16 0603) SpO2:  [94 %-99 %] 95 % (09/16 0603) Weight:  [205 lb (92.987 kg)-205 lb 7.5 oz (93.2 kg)] 205 lb 7.5 oz (93.2 kg) (09/16 0029)  Intake/Output from previous day:  Intake/Output Summary (Last 24 hours) at 12/04/13 0804 Last data filed at 12/04/13 0700  Gross per 24 hour  Intake    798 ml  Output      0 ml  Net    798 ml    Physical Exam: General appearance: alert, cooperative, no distress and moderately obese Lungs: faint expiratory wheezing Heart: regular rate and rhythm Extremities: Rt groin ecchymosis   Rate: 105  Rhythm: normal sinus rhythm and sinus tachycardia  Lab Results:  Recent Labs  12/02/13 1115 12/04/13 0333  WBC 8.7 6.0  HGB 11.7* 10.2*  PLT 213.0 154    Recent Labs  12/02/13 1115 12/04/13 0333  NA 137 139  K 3.5 4.1  CL 100 103  CO2 30 23  GLUCOSE 213* 175*  BUN 13 15  CREATININE 0.7 0.67   No results found for this basename: TROPONINI, CK, MB,  in the last 72 hours  Recent Labs  12/02/13 1115  INR 1.0    Imaging: Imaging results have been reviewed  Cardiac Studies:  Assessment/Plan:  63 year old female patient of Dr.Nishan who has history of CAD status post previous Cypher stent to the LAD, Cypher stent to the distal circumflex and OM 2 branch PTCA in 2005. She had ISR and had balloon angioplasty for restenosis of the circumflex in 2006. Last cath in 08/2009 patent stents and no critical stenosis. She also has severe COPD followed by Dr. Sherene Sires and 60-79% right ICA. She has had recurrent syncope and is being followed by a neurologist. She was seen as an OP with complaints of chest tightness and exertional dyspnea. She was admitted for cath and underwent  2 site CFX AV groove PCI with DES on 12/03/13.    Principal Problem:   Angina, class III Active Problems:   HYPERLIPIDEMIA   TOBACCO ABUSE   HYPERTENSION   CAD S/P percutaneous coronary angioplasty: Prior Cypher DES to pLAD, pOM2 & AVG Cx (after Om2); New DES to AVG Cx ISR & Angiosculpt PTCA of Ostial AVG Cx from OM2.   TRANSIENT ISCHEMIC ATTACK, HX OF   Syncope   Chest pain   Atherosclerotic heart disease of native coronary artery with angina pectoris    PLAN: Will review with MD. She is not well beta blocked but has significant COPD. She says she has been on metoprolol in the past. She currently on low dose Coreg. Possible discharge later today after she ambulates. F/U Dr Eden Emms.  Corine Shelter PA-C Beeper [phone] 12/04/2013, 8:04 AM

## 2013-12-04 NOTE — Discharge Summary (Signed)
Patient ID: Patricia Wall,  MRN: [id number], DOB/AGE: [birth year] 63 y.o.  Admit date: 12/03/2013 Discharge date: 12/04/2013  Primary Care Provider: Ihor Gully Primary Cardiologist: Dr Eden Emms  Discharge Diagnoses Principal Problem:   Angina, class III Active Problems:   HYPERLIPIDEMIA   TOBACCO ABUSE   HYPERTENSION   CAD S/P percutaneous coronary angioplasty: Prior Cypher DES to pLAD, pOM2 & AVG Cx (after Om2); New DES to AVG Cx ISR & Angiosculpt PTCA of Ostial AVG Cx from OM2.   TRANSIENT ISCHEMIC ATTACK, HX OF   Syncope   Chest pain   Atherosclerotic heart disease of native coronary artery with angina pectoris    Procedures: Cath CFX DES 12/03/13   Hospital Course:  63 year old female patient of Dr.Nishan who has history of CAD status post previous Cypher stent to the LAD, Cypher stent to the distal circumflex and OM 2 branch PTCA in 2005. She had ISR and had balloon angioplasty for restenosis of the circumflex in 2006. Last cath was 6/2011and showed patent stents and no critical stenosis. She also has severe COPD followed by Dr. Sherene Sires, and a 60-79% right ICA stenosis. She has had recurrent syncope and is being followed by a neurologist. She was seen as an OP 12/02/13 with complaints of chest tightness and exertional dyspnea. She was admitted for cath and underwent 2 site CFX AV groove PCI with DES on 12/03/13. She tolerated this well. Dr Katrinka Blazing saw her the morning of the 16th and he felt she was stable for discharge.    Discharge Vitals:  Blood pressure 133/79, pulse 104, temperature 97.7 F (36.5 C), temperature source Oral, resp. rate 20, height  (1.676 m), weight 205 lb 7.5 oz (93.2 kg), SpO2 95.00%.    Labs: Results for orders placed during the hospital encounter of 12/03/13 (from the past 24 hour(s))  POCT ACTIVATED CLOTTING TIME     Status: None   Collection Time    12/03/13  4:43 PM      Result Value Ref Range   Activated Clotting Time 236    POCT  ACTIVATED CLOTTING TIME     Status: None   Collection Time    12/03/13  5:01 PM      Result Value Ref Range   Activated Clotting Time 354    POCT ACTIVATED CLOTTING TIME     Status: None   Collection Time    12/03/13  8:03 PM      Result Value Ref Range   Activated Clotting Time 157    BASIC METABOLIC PANEL     Status: Abnormal   Collection Time    12/04/13  3:33 AM      Result Value Ref Range   Sodium 139  137 - 147 mEq/L   Potassium 4.1  3.7 - 5.3 mEq/L   Chloride 103  96 - 112 mEq/L   CO2 23  19 - 32 mEq/L   Glucose, Bld 175 (*) 70 - 99 mg/dL   BUN 15  6 - 23 mg/dL   Creatinine, Ser 4.09  0.50 - 1.10 mg/dL   Calcium 8.0 (*) 8.4 - 10.5 mg/dL   GFR calc non Af Amer >90  >90 mL/min   GFR calc Af Amer >90  >90 mL/min   Anion gap 13  5 - 15  CBC     Status: Abnormal   Collection Time    12/04/13  3:33 AM      Result Value Ref Range  WBC 6.0  4.0 - 10.5 K/uL   RBC 3.40 (*) 3.87 - 5.11 MIL/uL   Hemoglobin 10.2 (*) 12.0 - 15.0 g/dL   HCT 19.1 (*) 47.8 - 29.5 %   MCV 90.0  78.0 - 100.0 fL   MCH 30.0  26.0 - 34.0 pg   MCHC 33.3  30.0 - 36.0 g/dL   RDW 62.1  30.8 - 65.7 %   Platelets 154  150 - 400 K/uL    Disposition:  Follow-up Information   Follow up with Charlton Haws, MD On 12/19/2013. (2:30 pm)    Specialty:  Cardiology   Contact information:   [address] [place] [address] [phone]       Discharge Medications:    Medication List         acetaminophen 325 MG tablet  Commonly known as:  TYLENOL  Take 2 tablets (650 mg total) by mouth every 4 (four) hours as needed for headache or mild pain.     aspirin 81 MG tablet  Take 81 mg by mouth at bedtime.     atorvastatin 40 MG tablet  Commonly known as:  LIPITOR  Take 40 mg by mouth at bedtime.     calcium carbonate 600 MG Tabs tablet  Commonly known as:  OS-CAL  Take 600 mg by mouth daily.     carvedilol 3.125 MG tablet  Commonly known as:  COREG  Take 1 tablet (3.125 mg  total) by mouth 2 (two) times daily with a meal.     clopidogrel 75 MG tablet  Commonly known as:  PLAVIX  Take 75 mg by mouth daily.     DULoxetine 60 MG capsule  Commonly known as:  CYMBALTA  Take 60 mg by mouth 2 (two) times daily.     gabapentin 300 MG capsule  Commonly known as:  NEURONTIN  2 capsules by mouth at bedtime     HYDROcodone-acetaminophen 5-325 MG per tablet  Commonly known as:  NORCO/VICODIN  Take 1 tablet by mouth every 6 (six) hours as needed for moderate pain.     letrozole 2.5 MG tablet  Commonly known as:  FEMARA  Take 2.5 mg by mouth daily.     multivitamin tablet  Take 1 tablet by mouth daily.     niacin 1000 MG CR tablet  Commonly known as:  NIASPAN  2 tabs by mouth at bedtime     nitroGLYCERIN 0.4 MG SL tablet  Commonly known as:  NITROSTAT  Place 0.4 mg under the tongue every 5 (five) minutes as needed for chest pain (may repeat x3).     omeprazole 40 MG capsule  Commonly known as:  PRILOSEC  Take 40 mg by mouth daily.     PROAIR HFA 108 (90 BASE) MCG/ACT inhaler  Generic drug:  albuterol  Inhale 2 puffs into the lungs every 4 (four) hours as needed for wheezing or shortness of breath.     tiotropium 18 MCG inhalation capsule  Commonly known as:  SPIRIVA  Place 1 capsule (18 mcg total) into inhaler and inhale daily.     Vitamin D 2000 UNITS tablet  Take 2,000 Units by mouth 2 (two) times daily at 8 am and 10 pm.         Duration of Discharge Encounter: Greater than 30 minutes including physician time.  Jolene Provost PA-C 12/04/2013 10:09 AM

## 2013-12-10 ENCOUNTER — Telehealth: Payer: Self-pay | Admitting: Cardiovascular Disease

## 2013-12-10 NOTE — Telephone Encounter (Signed)
PT  NOTIFIED ./CY 

## 2013-12-10 NOTE — Telephone Encounter (Signed)
New message    Patient having upcoming back surgery - please advise on stopping plavix for  5 days.

## 2013-12-10 NOTE — Telephone Encounter (Signed)
SPOKE WITH   PT IS IN A LOT  OF PAIN  CANNOT  WAIT   A  YEAR    JUST  HAD  STENT  PLACEMENT ON    12-03-13  WILL FORWARD TO  DR Eden Emms FOR REVIEW .Zack Seal

## 2013-12-10 NOTE — Telephone Encounter (Signed)
ATTEMPTED TO CALL PT  X 2  BAD  CONNECTION  ONLY HEARD  STATIC  WILL TRY AGAIN LATER .Patricia Wall

## 2013-12-10 NOTE — Telephone Encounter (Signed)
Had multiple stents a few weeks ago cannot stop plavix unless she has possible irreversible cord issues

## 2013-12-16 ENCOUNTER — Telehealth: Payer: Self-pay | Admitting: Cardiovascular Disease

## 2013-12-16 NOTE — Telephone Encounter (Signed)
CLEARANCE  NOTE  FAXED  AS REQUESTED .Zack Seal

## 2013-12-16 NOTE — Telephone Encounter (Signed)
New message    hospital calling need cardiac clearance for upcoming surgery on 9/29 .

## 2013-12-19 ENCOUNTER — Encounter: Payer: Self-pay | Admitting: Cardiovascular Disease

## 2013-12-19 ENCOUNTER — Ambulatory Visit (INDEPENDENT_AMBULATORY_CARE_PROVIDER_SITE_OTHER): Payer: Medicare Other | Admitting: Cardiovascular Disease

## 2013-12-19 VITALS — BP 98/56 | HR 96 | Ht 66.0 in | Wt 207.8 lb

## 2013-12-19 DIAGNOSIS — E785 Hyperlipidemia, unspecified: Secondary | ICD-10-CM

## 2013-12-19 DIAGNOSIS — I2510 Atherosclerotic heart disease of native coronary artery without angina pectoris: Secondary | ICD-10-CM

## 2013-12-19 DIAGNOSIS — Z9861 Coronary angioplasty status: Secondary | ICD-10-CM

## 2013-12-19 DIAGNOSIS — IMO0001 Reserved for inherently not codable concepts without codable children: Secondary | ICD-10-CM

## 2013-12-19 DIAGNOSIS — I6529 Occlusion and stenosis of unspecified carotid artery: Secondary | ICD-10-CM

## 2013-12-19 DIAGNOSIS — I10 Essential (primary) hypertension: Secondary | ICD-10-CM

## 2013-12-19 DIAGNOSIS — J449 Chronic obstructive pulmonary disease, unspecified: Secondary | ICD-10-CM

## 2013-12-19 NOTE — Assessment & Plan Note (Signed)
Cholesterol is at goal.  Continue current dose of statin and diet Rx.  No myalgias or side effects.  F/U  LFT's in 6 months. Lab Results  Component Value Date   LDLCALC  Value: 63        Total Cholesterol/HDL:CHD Risk Coronary Heart Disease Risk Table                     Men   Women  1/2 Average Risk   3.4   3.3  Average Risk       5.0   4.4  2 X Average Risk   9.6   7.1  3 X Average Risk  23.4   11.0        Use the calculated Patient Ratio above and the CHD Risk Table to determine the patient's CHD Risk.        ATP III CLASSIFICATION (LDL):  <100     mg/dL   Optimal  [id number]  mg/dL   Near or Above                    Optimal  130-159  mg/dL   Borderline  [id number]  mg/dL   High  >811>190     mg/dL   Very High 9/14/78296/01/2010   [place]

## 2013-12-19 NOTE — Assessment & Plan Note (Signed)
Stable with no angina and good activity level.  Continue medical Rx Continue ASA and plavix indefinitely

## 2013-12-19 NOTE — Assessment & Plan Note (Signed)
No active wheezing Restarted on Spiriva  F/U pulmonary last FEV1 1.41 53%

## 2013-12-19 NOTE — Patient Instructions (Signed)
Your physician wants you to follow-up in:  6 MONTHS WITH DR NISHAN  You will receive a reminder letter in the mail two months in advance. If you don't receive a letter, please call our office to schedule the follow-up appointment. Your physician recommends that you continue on your current medications as directed. Please refer to the Current Medication list given to you today. 

## 2013-12-19 NOTE — Assessment & Plan Note (Signed)
Well controlled.  Continue current medications and low sodium Dash type diet.    

## 2013-12-19 NOTE — Progress Notes (Signed)
Patient ID: Patricia Wall, female   DOB: Apr 26, 1950, 63 y.o.   MRN: [id number] 63 year old female patient who has history of CAD status post previous Cypher stent to the LAD, Cypher stent to the distal circumflex and OM 2 branch PTCA in 2005. She had ISR and had balloon angioplasty for restenosis of the circumflex in 2006. Last cath was 6/2011and showed patent stents and no critical stenosis. She also has severe COPD followed by Dr. Sherene Sires, and a 60-79% right ICA stenosis. She has had recurrent syncope and is being followed by a neurologist. She was seen as an OP 12/02/13 with complaints of chest tightness and exertional dyspnea. She was admitted for cath and underwent 2 site CFX AV groove PCI with DES on 12/03/13. She tolerated this well.   Subsequently has had severe back pain She had kyphoplasty in [place] recently but did not stop her plavix for it     ROS: Denies fever, malais, weight loss, blurry vision, decreased visual acuity, cough, sputum, SOB, hemoptysis, pleuritic pain, palpitaitons, heartburn, abdominal pain, melena, lower extremity edema, claudication, or rash.  All other systems reviewed and negative  General: Affect appropriate Chronically ill female in wheel chair HEENT: normal Neck supple with no adenopathy JVP normal no bruits no thyromegaly Lungs clear with no wheezing and good diaphragmatic motion Heart:  S1/S2 no murmur, no rub, gallop or click PMI normal Abdomen: benighn, BS positve, no tenderness, no AAA no bruit.  No HSM or HJR Distal pulses intact with no bruits No edema Neuro non-focal Skin warm and dry Bilateral LE weakness    Current Outpatient Prescriptions  Medication Sig Dispense Refill  . acetaminophen (TYLENOL) 325 MG tablet Take 2 tablets (650 mg total) by mouth every 4 (four) hours as needed for headache or mild pain.      [name] albuterol (PROAIR HFA) 108 (90 BASE) MCG/ACT inhaler Inhale 2 puffs into the lungs every 4 (four) hours as needed for wheezing or  shortness of breath.      [name] aspirin 81 MG tablet Take 81 mg by mouth at bedtime.       [name] atorvastatin (LIPITOR) 40 MG tablet Take 40 mg by mouth at bedtime.      . calcium carbonate (OS-CAL) 600 MG TABS Take 600 mg by mouth daily.        . carvedilol (COREG) 3.125 MG tablet Take 1 tablet (3.125 mg total) by mouth 2 (two) times daily with a meal.  60 tablet  3  . Cholecalciferol (VITAMIN D) 2000 UNITS tablet Take 2,000 Units by mouth 2 (two) times daily at 8 am and 10 pm.       . clopidogrel (PLAVIX) 75 MG tablet Take 75 mg by mouth daily.        . DULoxetine (CYMBALTA) 60 MG capsule Take 60 mg by mouth 2 (two) times daily.      [name] gabapentin (NEURONTIN) 300 MG capsule 2 capsules by mouth at bedtime      . HYDROcodone-acetaminophen (NORCO/VICODIN) 5-325 MG per tablet Take 1 tablet by mouth every 6 (six) hours as needed for moderate pain.      [name] letrozole (FEMARA) 2.5 MG tablet Take 2.5 mg by mouth daily.      . Multiple Vitamin (MULTIVITAMIN) tablet Take 1 tablet by mouth daily.      . niacin (NIASPAN) 1000 MG CR tablet 2 tabs by mouth at bedtime      . nitroGLYCERIN (NITROSTAT) 0.4 MG SL tablet Place 0.4 mg under  the tongue every 5 (five) minutes as needed for chest pain (may repeat x3).       [name]. omeprazole (PRILOSEC) 40 MG capsule Take 40 mg by mouth daily.      [name]. tiotropium (SPIRIVA) 18 MCG inhalation capsule Place 1 capsule (18 mcg total) into inhaler and inhale daily.  30 capsule  6   No current facility-administered medications for this visit.    Allergies  Review of patient's allergies indicates no known allergies.  Electrocardiogram:  SR rate 100 low voltage otherwise normal   Assessment and Plan

## 2013-12-30 ENCOUNTER — Encounter: Payer: Self-pay | Admitting: Internal Medicine

## 2013-12-30 ENCOUNTER — Ambulatory Visit (INDEPENDENT_AMBULATORY_CARE_PROVIDER_SITE_OTHER)
Admission: RE | Admit: 2013-12-30 | Discharge: 2013-12-30 | Disposition: A | Discharge status: Home / Self Care | Payer: Medicare Other | Source: Ambulatory Visit | Attending: Internal Medicine | Admitting: Internal Medicine

## 2013-12-30 ENCOUNTER — Ambulatory Visit (INDEPENDENT_AMBULATORY_CARE_PROVIDER_SITE_OTHER): Payer: Medicare Other | Admitting: Internal Medicine

## 2013-12-30 VITALS — BP 106/62 | HR 136 | Temp 98.5°F | Ht 66.0 in | Wt 207.0 lb

## 2013-12-30 DIAGNOSIS — J441 Chronic obstructive pulmonary disease with (acute) exacerbation: Secondary | ICD-10-CM

## 2013-12-30 DIAGNOSIS — J449 Chronic obstructive pulmonary disease, unspecified: Secondary | ICD-10-CM

## 2013-12-30 DIAGNOSIS — IMO0001 Reserved for inherently not codable concepts without codable children: Secondary | ICD-10-CM

## 2013-12-30 DIAGNOSIS — I6529 Occlusion and stenosis of unspecified carotid artery: Secondary | ICD-10-CM

## 2013-12-30 MED ORDER — FLUTICASONE FUROATE-VILANTEROL 100-25 MCG/INH IN AEPB
1.0000 | INHALATION_SPRAY | Freq: Every morning | RESPIRATORY_TRACT | Status: DC
Start: 2013-12-30 — End: 2014-05-02

## 2013-12-30 MED ORDER — AMOXICILLIN-POT CLAVULANATE 875-125 MG PO TABS: 1.0000 | ORAL_TABLET | Freq: Two times a day (BID) | ORAL | Status: DC

## 2013-12-30 MED ORDER — PREDNISONE 10 MG PO TABS: ORAL_TABLET | ORAL | Status: DC

## 2013-12-30 NOTE — Progress Notes (Signed)
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 rec Prilosec Take 30-60 min before first meal of the day  Stop all inhalers except ventolin and use it up to 2 puffs every 4 hours if can't catch your breath Anoro open once and take two dep drags each am as soon as you get up each day   02/19/2013 f/u ov/Patricia Wall re: copd gold II, better on anoro / still on coreg Chief Complaint  Patient presents with  . Follow-up    Pt reports having increased SOB and cough for the past 5 days- cough is prod with very minimal white to yellow sputum.  She also c/o chest tightness, using ventolin approx tid   While on anoro much less need for ventolin and now ventolin not really helping with doe but never at rest  Prednisone 10 mg take  4 each am x 2 days,   2 each am x 2 days,  1 each am x 2 days and stop  Restart anoro Stop carvedilol and start bystolic 10 mg one daily    04/02/2013 f/u ov/Patricia Wall re: GOLD II copd  Chief Complaint  Patient presents with  . Follow-up    Pt c/o increased cough x 2 wks- prod with minimal light brown sputum. She states sputum is hard to produce. She states breathing is unchanged since the last visit. She is using rescue inhaler 3-4 times per wk.   Much better until [place] eve "caught a cold"  On anoro and prn saba  rec Let us know if you can't fill the prescription for anoro one daily before your samples run  out  Stop bystolic and start bisoprolol 5 mg one daily - take one half daily if too strong Only use your albuterol as a rescue   Prednisone 10 mg take  4 each am x 2 days,   2 each am x 2 days,  1 each am x 2 days and stop  Zpak    10/02/2013 f/u ov/Patricia Wall re: GOLD II copd / seeing multiple disconnected emr docs/ has 02 at hs ? 3lpm Chief Complaint  Patient presents with  . Follow-up    Pt c/o increased SOB and cough for the past 2-3 wks.   med sheets are incorrect, no longer on any BB/ over using saba to point where using 10-12 x per day  No brown sputum x 2 weeks.    >>rx incruse and zebeta in place of spiriva and coreg.   10/15/13 Follow up and Med review  Returns for follow up and med review .  Daughter is with her today that helps with meds .  Feels better on inhalers but insurance will not cover.   Medicaid/Medicare will not cover the Incruse or Zebeta We reviewed all her medications organized them into a medication  calendar with patient education. rec Follow med calendar closely and bring to each visit.  Prevnar Vaccine today.  Restart Carvedilol 3.125mg  Twice daily  (Since insurance would not cover Bisoprolol)  Restart Spiriva 1 puff daily (Since Insurance would not cover Incruse)    12/30/2013 f/u ov/Patricia Wall re: gold II copd with flare  Chief Complaint  Patient presents with  . Follow-up    Pt states that her breathing has been worse for the past 2 wks. She states that she has had stent placement since last visit and also back surgery. She has been using proair about 2 x per day on average.   worse x 2 weeks sob assoc with worse cough / green mucus  proair does help despite poor hfa    No obvious day to day or daytime variabilty or assoc cp or chest tightness, subjective wheeze overt sinus or hb symptoms. No unusual exp hx or h/o childhood pna/ asthma or knowledge of premature birth.  Sleeping ok without nocturnal  or early am exacerbation  of respiratory  c/o's or need for  noct saba. Also denies any obvious fluctuation of symptoms with weather or environmental changes or other aggravating or alleviating factors except as outlined above   Current Medications, Allergies, Complete Past Medical History, Past Surgical History, Family History, and Social History were reviewed in [facility] electronic medical record.  ROS  The following are not active complaints unless bolded sore throat, dysphagia, dental problems, itching, sneezing,  nasal congestion or excess/ purulent secretions, ear ache,   fever, chills, sweats, unintended wt loss, pleuritic or exertional cp, hemoptysis,  orthopnea pnd or leg swelling, presyncope, palpitations, heartburn, abdominal pain, anorexia, nausea, vomiting, diarrhea  or change in bowel or urinary habits, change in stools or urine, dysuria,hematuria,  rash, arthralgias, visual complaints, headache, numbness weakness or ataxia or problems with walking or coordination,  change in mood/affect or memory.                             Objective:  Physical Exam  amb wf nad    04/02/2013       198  >   10/02/2013   207 >  201 10/15/13 > 12/30/2013  207      HEENT mild turbinate edema.  Oropharynx no thrush or excess pnd or cobblestoning.  No JVD or cervical adenopathy. Mild accessory muscle hypertrophy. Trachea midline, nl thryroid. Chest was hyperinflated by percussion with diminished breath sounds in bases   Regular rate and rhythm without murmur gallop or rub or increase P2 or edema.  Abd: no hsm, nl excursion. Ext warm without cyanosis or clubbing.      CXR  12/30/2013 :  No active cardiopulmonary disease.    [name].  Recent Labs Lab 10/02/13 1000  NA 138  K 3.6  CL 102  CO2 29  BUN 11  CREATININE 0.7  GLUCOSE 188*    Recent Labs Lab 10/02/13 1000  HGB 12.0  HCT 36.2  WBC 9.3  PLT 220.0      Lab Results  Component Value Date   PROBNP 29.0 10/02/2013    Lab Results  Component Value Date   TSH 1.32 10/02/2013      Assessment:

## 2013-12-30 NOTE — Patient Instructions (Addendum)
Augmentin 875 mg take one pill twice daily  X 10 days - take at breakfast and supper with large glass of water.  It would help reduce the usual side effects (diarrhea and yeast infections) if you ate cultured yogurt at lunch.   Prednisone 10 mg take  4 each am x 2 days,   2 each am x 2 days,  1 each am x 2 days and stop   Breo 100 one click each am with am Spiriva  Please schedule a follow up office visit in 4 weeks, sooner if needed to see Tammy with all your meds in hand for new med calendar - bring forumulary if possible

## 2013-12-31 NOTE — Assessment & Plan Note (Addendum)
-   pft's 12/12/12 FEV1  1.41 (53%) ratio 54 with no significant reversibility and DLCO 47% corrects to 50 01/16/2013  Walked RA x 2 laps @ 185 ft each stopped due to sob, no desat     - Trial of anoro 01/16/2013 > helped but insurance would not pay - incruse one daily started 10/02/2013 >>>not covered  -Spiriva restarted 10/15/13 > added  Breo 12/30/13  10/15/13 Med calendar    DDX of  difficult airways management all start with A and  include Adherence, Ace Inhibitors, Acid Reflux, Active Sinus Disease, Alpha 1 Antitripsin deficiency, Anxiety masquerading as Airways dz,  ABPA,  allergy(esp in young), Aspiration (esp in elderly), Adverse effects of DPI,  Active smokers, plus two Bs  = Bronchiectasis and Beta blocker use..and one C= CHF  Adherence is always the initial "prime suspect" and is a multilayered concern that requires a "trust but verify" approach in every patient - starting with knowing how to use medications, especially inhalers, correctly, keeping up with refills and understanding the fundamental difference between maintenance and prns vs those medications only taken for a very short course and then stopped and not refilled.  - The proper method of use, as well as anticipated side effects, of a metered-dose inhaler are discussed and demonstrated to the patient. Improved effectiveness after extensive coaching during this visit to a level of approximately  50% with hfa so try breo 100 2 puffs each am  ? Active sinus dz > Augmentin 875 mg take one pill twice daily  X 10 days   ? Allergic component > Prednisone 10 mg take  4 each am x 2 days,   2 each am x 2 days,  1 each am x 2 days and stop   ? BBeffect > coreg in low doses should be ok but would prefer bisoprolol if insurance covered   ? chf  Lab Results  Component Value Date   PROBNP 29.0 10/02/2013   so unlikely unless new ischemic cm develops  See instructions for specific recommendations which were reviewed directly with the patient  who was given a copy with highlighter outlining the key components.

## 2013-12-31 NOTE — Progress Notes (Signed)
Quick Note:  Spoke with pt and notified of results per Dr. Wert. Pt verbalized understanding and denied any questions.  ______ 

## 2013-12-31 NOTE — Assessment & Plan Note (Signed)
rx Prednisone 10 mg take  4 each am x 2 days,   2 each am x 2 days,  1 each am x 2 days and stop and add breo

## 2014-01-27 ENCOUNTER — Encounter: Payer: Self-pay | Admitting: Adult Health

## 2014-01-27 ENCOUNTER — Ambulatory Visit (INDEPENDENT_AMBULATORY_CARE_PROVIDER_SITE_OTHER): Payer: Medicare Other | Admitting: Adult Health

## 2014-01-27 VITALS — BP 122/80 | HR 106 | Temp 96.9°F | Ht 66.0 in | Wt 209.0 lb

## 2014-01-27 DIAGNOSIS — J449 Chronic obstructive pulmonary disease, unspecified: Secondary | ICD-10-CM

## 2014-01-27 DIAGNOSIS — IMO0001 Reserved for inherently not codable concepts without codable children: Secondary | ICD-10-CM

## 2014-01-27 NOTE — Patient Instructions (Signed)
Follow med calendar closely and bring to each visit.  Continue on BREO and Spiriva 1 puff daily  Follow up Dr. Sherene SiresWert  In 3 months and As needed

## 2014-01-27 NOTE — Assessment & Plan Note (Signed)
Compensated on present regimen  Plan Follow med calendar closely and bring to each visit.  Continue on BREO and Spiriva 1 puff daily  Follow up Dr. Sherene SiresWert  In 3 months and As needed

## 2014-01-27 NOTE — Progress Notes (Signed)
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 rec Prilosec Take 30-60 min before first meal of the day  Stop all inhalers except ventolin and use it up to 2 puffs every 4 hours if can't catch your breath Anoro open once and take two dep drags each am as soon as you get up each day   02/19/2013 f/u ov/Wert re: copd gold II, better on anoro / still on coreg Chief Complaint  Patient presents with  . Follow-up    Pt reports having increased SOB and cough for the past 5 days- cough is prod with very minimal white to yellow sputum.  She also c/o chest tightness, using ventolin approx tid   While on anoro much less need for ventolin and now ventolin not really helping with doe but never at rest  Prednisone 10 mg take  4 each am x 2 days,   2 each am x 2 days,  1 each am x 2 days and stop  Restart anoro Stop carvedilol and start bystolic 10 mg one daily    04/02/2013 f/u ov/Wert re: GOLD II copd  Chief Complaint  Patient presents with  . Follow-up    Pt c/o increased cough x 2 wks- prod with minimal light brown sputum. She states sputum is hard to produce. She states breathing is unchanged since the last visit. She is using rescue inhaler 3-4 times per wk.   Much better until [place] "caught a cold"  On anoro and prn saba  rec Let us know if you can't fill the prescription for anoro one daily before your samples run  out  Stop bystolic and start bisoprolol 5 mg one daily - take one half daily if too strong Only use your albuterol as a rescue   Prednisone 10 mg take  4 each am x 2 days,   2 each am x 2 days,  1 each am x 2 days and stop  Zpak    10/02/2013 f/u ov/Wert re: GOLD II copd / seeing multiple disconnected emr docs/ has 02 at hs ? 3lpm Chief Complaint  Patient presents with  . Follow-up    Pt c/o increased SOB and cough for the past 2-3 wks.   med sheets are incorrect, no longer on any BB/ over using saba to point where using 10-12 x per day  No brown sputum x 2 weeks.    >>rx incruse and zebeta in place of spiriva and coreg.   10/15/13 Follow up and Med review  Returns for follow up and med review .  Daughter is with her today that helps with meds .  Feels better on inhalers but insurance will not cover.   Medicaid/Medicare will not cover the Incruse or Zebeta We reviewed all her medications organized them into a medication  calendar with patient education. rec Follow med calendar closely and bring to each visit.  Prevnar Vaccine today.  Restart Carvedilol 3.125mg  Twice daily  (Since insurance would not cover Bisoprolol)  Restart Spiriva 1 puff daily (Since Insurance would not cover Incruse)    12/30/2013 f/u ov/Wert re: gold II copd with flare  Chief Complaint  Patient presents with  . Follow-up    Pt states that her breathing has been worse for the past 2 wks. She states that she has had stent placement since last visit and also back surgery. She has been using proair about 2 x per day on average.   worse x 2 weeks sob assoc with worse cough / green mucus  proair does help despite poor hfa  >>Augmentin and Prednisone    01/27/2014 Follow up and Medication Review (COPD )  Patient returns for a one-month follow-up. We reviewed all her medications organize them into a medication calendar with patient education. It appears that she is taking her medications correctly. Patient had a  COPD exacerbation. Last visit. She was treated with Augmentin and prednisone taper. Patient reports that she is improved. She reports that she has good days and bad days. She denies any hemoptysis, orthopnea, PND, leg swelling or discolored mucus.    Current Medications, Allergies, Complete Past Medical History, Past Surgical History, Family History, and Social History were reviewed in [facility] electronic medical record.  ROS  The following are not active complaints unless bolded sore throat, dysphagia, dental problems, itching, sneezing,  nasal congestion or excess/ purulent secretions, ear ache,   fever, chills, sweats, unintended wt loss, pleuritic or exertional cp, hemoptysis,  orthopnea pnd or leg swelling, presyncope, palpitations, heartburn, abdominal pain, anorexia, nausea, vomiting, diarrhea  or change in bowel or urinary habits, change in stools or urine, dysuria,hematuria,  rash, arthralgias, visual complaints, headache, numbness weakness or ataxia or problems with walking or coordination,  change in mood/affect or memory.                             Objective:  Physical Exam  amb wf nad    04/02/2013       198  >   10/02/2013   207 >  201 10/15/13 > 12/30/2013  207 >209 01/27/2014      HEENT mild turbinate edema.  Oropharynx no thrush or excess pnd or cobblestoning.  No JVD or cervical adenopathy. Mild accessory muscle hypertrophy. Trachea midline, nl thryroid. Chest was hyperinflated by percussion with diminished breath sounds in bases   Regular rate and rhythm without murmur gallop or rub or increase P2 or edema.  Abd: no hsm, nl excursion. Ext warm without cyanosis or clubbing.      CXR  12/30/2013 :  No active cardiopulmonary disease.    [name].  Recent Labs Lab 10/02/13 1000  NA 138  K 3.6  CL 102  CO2 29  BUN 11  CREATININE 0.7  GLUCOSE 188*    Recent Labs Lab 10/02/13 1000  HGB 12.0  HCT 36.2  WBC 9.3  PLT 220.0      Lab Results  Component  Value Date   PROBNP 29.0 10/02/2013    Lab Results  Component Value Date   TSH 1.32 10/02/2013     Assessment:

## 2014-02-15 ENCOUNTER — Other Ambulatory Visit: Payer: Self-pay | Admitting: Adult Health

## 2014-02-24 ENCOUNTER — Other Ambulatory Visit: Payer: Self-pay | Admitting: Internal Medicine

## 2014-02-27 ENCOUNTER — Encounter (HOSPITAL_COMMUNITY): Payer: Self-pay | Admitting: Cardiology

## 2014-04-25 ENCOUNTER — Emergency Department (HOSPITAL_COMMUNITY): Payer: Medicare Other

## 2014-04-25 ENCOUNTER — Emergency Department (HOSPITAL_COMMUNITY)
Admission: EM | Admit: 2014-04-25 | Discharge: 2014-04-25 | Disposition: A | Discharge status: Home / Self Care | Payer: Medicare Other | Attending: Emergency Medicine | Admitting: Emergency Medicine

## 2014-04-25 ENCOUNTER — Encounter (HOSPITAL_COMMUNITY): Payer: Self-pay | Admitting: *Deleted

## 2014-04-25 DIAGNOSIS — Z87891 Personal history of nicotine dependence: Secondary | ICD-10-CM | POA: Insufficient documentation

## 2014-04-25 DIAGNOSIS — M1990 Unspecified osteoarthritis, unspecified site: Secondary | ICD-10-CM | POA: Diagnosis not present

## 2014-04-25 DIAGNOSIS — Z7902 Long term (current) use of antithrombotics/antiplatelets: Secondary | ICD-10-CM | POA: Insufficient documentation

## 2014-04-25 DIAGNOSIS — G4700 Insomnia, unspecified: Secondary | ICD-10-CM | POA: Insufficient documentation

## 2014-04-25 DIAGNOSIS — I2510 Atherosclerotic heart disease of native coronary artery without angina pectoris: Secondary | ICD-10-CM | POA: Insufficient documentation

## 2014-04-25 DIAGNOSIS — K219 Gastro-esophageal reflux disease without esophagitis: Secondary | ICD-10-CM | POA: Diagnosis not present

## 2014-04-25 DIAGNOSIS — Z79899 Other long term (current) drug therapy: Secondary | ICD-10-CM | POA: Diagnosis not present

## 2014-04-25 DIAGNOSIS — R079 Chest pain, unspecified: Secondary | ICD-10-CM | POA: Diagnosis present

## 2014-04-25 DIAGNOSIS — Z7982 Long term (current) use of aspirin: Secondary | ICD-10-CM | POA: Insufficient documentation

## 2014-04-25 DIAGNOSIS — R1013 Epigastric pain: Secondary | ICD-10-CM | POA: Insufficient documentation

## 2014-04-25 DIAGNOSIS — Z7951 Long term (current) use of inhaled steroids: Secondary | ICD-10-CM | POA: Insufficient documentation

## 2014-04-25 DIAGNOSIS — I10 Essential (primary) hypertension: Secondary | ICD-10-CM | POA: Diagnosis not present

## 2014-04-25 DIAGNOSIS — R1110 Vomiting, unspecified: Secondary | ICD-10-CM | POA: Insufficient documentation

## 2014-04-25 DIAGNOSIS — F329 Major depressive disorder, single episode, unspecified: Secondary | ICD-10-CM | POA: Diagnosis not present

## 2014-04-25 DIAGNOSIS — J441 Chronic obstructive pulmonary disease with (acute) exacerbation: Secondary | ICD-10-CM | POA: Insufficient documentation

## 2014-04-25 DIAGNOSIS — Z8673 Personal history of transient ischemic attack (TIA), and cerebral infarction without residual deficits: Secondary | ICD-10-CM | POA: Diagnosis not present

## 2014-04-25 LAB — BASIC METABOLIC PANEL
Anion gap: 9 (ref 5–15)
BUN: 7 mg/dL (ref 6–23)
CALCIUM: 9.2 mg/dL (ref 8.4–10.5)
CHLORIDE: 101 mmol/L (ref 96–112)
CO2: 27 mmol/L (ref 19–32)
Creatinine, Ser: 0.64 mg/dL (ref 0.50–1.10)
GFR calc Af Amer: >90 mL/min (ref >90)
GLUCOSE: 147 mg/dL — AB (ref 70–99)
Potassium: 3.9 mmol/L (ref 3.5–5.1)
SODIUM: 137 mmol/L (ref 135–145)

## 2014-04-25 LAB — CBC
HCT: 35.1 % — ABNORMAL LOW (ref 36.0–46.0)
Hemoglobin: 11.5 g/dL — ABNORMAL LOW (ref 12.0–15.0)
MCH: 27.6 pg (ref 26.0–34.0)
MCHC: 32.8 g/dL (ref 30.0–36.0)
MCV: 84.2 fL (ref 78.0–100.0)
Platelets: 225 10*3/uL (ref 150–400)
RBC: 4.17 MIL/uL (ref 3.87–5.11)
RDW: 14.2 % (ref 11.5–15.5)
WBC: 8 10*3/uL (ref 4.0–10.5)

## 2014-04-25 LAB — TROPONIN I
Troponin I: <0.03 ng/mL (ref <0.031)
Troponin I: <0.03 ng/mL (ref <0.031)

## 2014-04-25 LAB — LIPASE, BLOOD: Lipase: 24 U/L (ref 11–59)

## 2014-04-25 MED ORDER — SUCRALFATE 1 G PO TABS: 1.0000 g | ORAL_TABLET | Freq: Three times a day (TID) | ORAL | Status: DC

## 2014-04-25 MED ORDER — SODIUM CHLORIDE 0.9 % IV BOLUS (SEPSIS)
1000.0000 mL | Freq: Once | INTRAVENOUS | Status: AC
  Administered 2014-04-25: 1000 mL via INTRAVENOUS

## 2014-04-25 MED ORDER — GI COCKTAIL ~~LOC~~
30.0000 mL | Freq: Once | ORAL | Status: AC
  Administered 2014-04-25: 30 mL via ORAL
  Filled 2014-04-25: qty 30

## 2014-04-25 NOTE — ED Notes (Signed)
EKG completed in triage.

## 2014-04-25 NOTE — ED Provider Notes (Signed)
CSN: [id number]     Arrival date & time 04/25/14  1537 History   First MD Initiated Contact with Patient 04/25/14 1709     Chief Complaint  Patient presents with  . Chest Pain     (Consider location/radiation/quality/duration/timing/severity/associated sxs/prior Treatment) Patient is a 64 y.o. female presenting with chest pain. The history is provided by the patient.  Chest Pain Pain location:  Epigastric Pain quality: aching and burning   Pain radiates to:  Does not radiate Pain radiates to the back: no   Pain severity:  Moderate Onset quality:  Gradual Duration:  1 week (for the last 2 days moved higher in the epigastric area ) Timing:  Constant Progression:  Waxing and waning Chronicity:  New Context comment:  Seem to start spontaneously Relieved by:  Nitroglycerin Worsened by:  Nothing tried Ineffective treatments:  None tried Associated symptoms: abdominal pain and vomiting   Associated symptoms: no altered mental status, no anorexia, no anxiety, no cough, no diaphoresis, no nausea, no palpitations and no shortness of breath   Associated symptoms comment:  Patient states that she had upper abdominal pain for the last 1-2 weeks and then it moved higher in her epigastrium 2 days ago. Yesterday and today she took a nitroglycerin and the pain seemed to improve but did not resolve.  She had multiple episodes of vomiting 3 days ago but no nausea or vomiting since then Risk factors: coronary artery disease and hypertension   Risk factors: no diabetes mellitus and no smoking     Past Medical History  Diagnosis Date  . Chest pain   . HTN (hypertension)   . TIA (transient ischemic attack)   . CAD (coronary artery disease)      [facility]  Stent to OM and LAD Last cath 6/11 cathed on June 13, [name]. 2011.  The cardiac catheterization showed 30% and 50% lesions in the mid  and distal LAD, 50% in the OM1, 40% in the OM2 with 20% in-stent  restenosis, 40% in-stent restenosis in  the circumflex and no significant  disease in the RCA.  Her EF was normal.  Dr. Juanda ChanceBrodie recommended a trial  of proton pump inhibitors   . PVD (peripheral vascular disease)   . Abnormal chest xray   . Vertigo   . Lymphadenitis   . Pharyngitis   . Sinusitis   . Insomnia   . Cyanosis   . Tobacco abuse   . IBS (irritable bowel syndrome)   . Diarrhea   . Hemorrhoids   . DDD (degenerative disc disease)   . Alcohol abuse   . PUD (peptic ulcer disease)   . Back pain   . GERD (gastroesophageal reflux disease)   . Diverticular disease   . Depression   . COPD (chronic obstructive pulmonary disease)   . H/O: hysterectomy    Past Surgical History  Procedure Laterality Date  . Appendectomy    . Tubal ligation    . Cardiac catheterization  2005, 2006, 2009,2011  . Coronary stent placement      Status post balloon angiogram plasty and Cypher stent to the distal    circumflex and OM2 branch  . Left bunionectomy    . Hammertoe repair    . Left heart catheterization with coronary angiogram N/A 12/03/2013    Procedure: LEFT HEART CATHETERIZATION WITH CORONARY ANGIOGRAM;  Surgeon: Marykay Lexavid W Harding, MD;  [facility];  Service: Cardiovascular;  Laterality: N/A;   Family History  Problem Relation  Age of Onset  . Coronary artery disease    . Emphysema Father   . Asthma Sister     2 sisters  . Heart disease Mother   . Heart disease Father   . Rheumatologic disease Mother   . Cancer - Other Mother     Uterine  . Cancer - Other Father   . Stroke Sister    History  Substance Use Topics  . Smoking status: Former Smoker -- 2.00 packs/day for 52 years    Types: Cigarettes    Start date: 01/16/1961    Quit date: 10/19/2012  . Smokeless tobacco: Not on file  . Alcohol Use: No   OB History    No data available     Review of Systems  Constitutional: Negative for diaphoresis.  Respiratory: Negative for cough and shortness of breath.   Cardiovascular: Positive for chest pain.  Negative for palpitations.  Gastrointestinal: Positive for vomiting and abdominal pain. Negative for nausea and anorexia.  All other systems reviewed and are negative.     Allergies  Review of patient's allergies indicates no known allergies.  Home Medications   Prior to Admission medications   Medication Sig Start Date End Date Taking? Authorizing Provider  acetaminophen (TYLENOL) 325 MG tablet Take 2 tablets (650 mg total) by mouth every 4 (four) hours as needed for headache or mild pain. 12/04/13   Abelino Derrick, PA-C  albuterol (PROAIR HFA) 108 (90 BASE) MCG/ACT inhaler Inhale 2 puffs into the lungs every 4 (four) hours as needed for wheezing or shortness of breath.    Historical Provider, MD  amoxicillin-clavulanate (AUGMENTIN) 875-125 MG per tablet Take 1 tablet by mouth 2 (two) times daily. 12/30/13   Nyoka Cowden, MD  aspirin 81 MG tablet Take 81 mg by mouth at bedtime.     Historical Provider, MD  atorvastatin (LIPITOR) 40 MG tablet Take 40 mg by mouth at bedtime.    Historical Provider, MD  BREO ELLIPTA 100-25 MCG/INH AEPB INHALE 1 PUFF INTO THE LUNGS EVERY MORNING 02/24/14   Nyoka Cowden, MD  calcium carbonate (OS-CAL) 600 MG TABS Take 600 mg by mouth daily.      Historical Provider, MD  carvedilol (COREG) 3.125 MG tablet TAKE 1 TABLET (3.125 MG TOTAL) BY MOUTH 2 (TWO) TIMES DAILY WITH A MEAL. 02/21/14   Tammy S Parrett, NP  Cholecalciferol (VITAMIN D) 2000 UNITS tablet Take 2,000 Units by mouth 2 (two) times daily at 8 am and 10 pm.     Historical Provider, MD  clopidogrel (PLAVIX) 75 MG tablet Take 75 mg by mouth daily.      Historical Provider, MD  DULoxetine (CYMBALTA) 60 MG capsule Take 60 mg by mouth 2 (two) times daily.    Historical Provider, MD  Fluticasone Furoate-Vilanterol (BREO ELLIPTA) 100-25 MCG/INH AEPB Inhale 1 puff into the lungs every morning. 12/30/13   Nyoka Cowden, MD  gabapentin (NEURONTIN) 300 MG capsule 3 capsules by mouth at bedtime    Historical  Provider, MD  HYDROcodone-acetaminophen (NORCO/VICODIN) 5-325 MG per tablet Take 1 tablet by mouth every 6 (six) hours as needed for moderate pain.    Historical Provider, MD  letrozole (FEMARA) 2.5 MG tablet Take 2.5 mg by mouth daily.    Historical Provider, MD  Multiple Vitamin (MULTIVITAMIN) tablet Take 1 tablet by mouth daily.    Historical Provider, MD  niacin (NIASPAN) 1000 MG CR tablet 2 tabs by mouth at bedtime    Historical Provider, MD  nitroGLYCERIN (NITROSTAT) 0.4 MG SL tablet Place 0.4 mg under the tongue every 5 (five) minutes as needed for chest pain (may repeat x3).     Historical Provider, MD  omeprazole (PRILOSEC) 40 MG capsule Take 40 mg by mouth daily.    Historical Provider, MD  predniSONE (DELTASONE) 10 MG tablet Take  4 each am x 2 days,   2 each am x 2 days,  1 each am x 2 days and stop 12/30/13   Nyoka Cowden, MD  tiotropium (SPIRIVA) 18 MCG inhalation capsule Place 1 capsule (18 mcg total) into inhaler and inhale daily. 10/18/13   Tammy S Parrett, NP   BP 113/76 mmHg  Pulse 121  Temp(Src) 98.7 F (37.1 C) (Oral)  Resp 24  SpO2 95% Physical Exam  Constitutional: She is oriented to person, place, and time. She appears well-developed and well-nourished. No distress.  HENT:  Head: Normocephalic and atraumatic.  Mouth/Throat: Oropharynx is clear and moist.  Eyes: Conjunctivae and EOM are normal. Pupils are equal, round, and reactive to light.  Neck: Normal range of motion. Neck supple.  Cardiovascular: Normal rate, regular rhythm and intact distal pulses.   No murmur heard. Pulmonary/Chest: Effort normal. No respiratory distress. She has decreased breath sounds. She has wheezes. She has no rales.  Scant scattered wheezes  Abdominal: Soft. She exhibits no distension. There is tenderness in the epigastric area. There is no rebound and no guarding.  Musculoskeletal: Normal range of motion. She exhibits no edema or tenderness.  Neurological: She is alert and oriented  to person, place, and time.  Skin: Skin is warm and dry. No rash noted. No erythema.  Psychiatric: She has a normal mood and affect. Her behavior is normal.  Nursing note and vitals reviewed.   ED Course  Procedures (including critical care time) Labs Review Labs Reviewed  CBC - Abnormal; Notable for the following:    Hemoglobin 11.5 (*)    HCT 35.1 (*)    All other components within normal limits  BASIC METABOLIC PANEL - Abnormal; Notable for the following:    Glucose, Bld 147 (*)    All other components within normal limits  TROPONIN I  LIPASE, BLOOD  TROPONIN I    Imaging Review Dg Chest 2 View  04/25/2014   CLINICAL DATA:  Three-day history of chest pain and difficulty breathing  EXAM: CHEST  2 VIEW  COMPARISON:  December 30, 2013.  FINDINGS: There is mild scarring in the left base. Lungs elsewhere are clear. Heart size and pulmonary vascularity are normal. No adenopathy. No pneumothorax. No bone lesions.  IMPRESSION: Mild scarring left base.  No edema or consolidation.   Electronically Signed   By: Bretta Bang M.D.   On: 04/25/2014 16:24     EKG Interpretation   Date/Time:  Friday April 25 2014 15:42:02 EST Ventricular Rate:  129 PR Interval:  112 QRS Duration: 82 QT Interval:  320 QTC Calculation: 468 R Axis:   83 Text Interpretation:  Sinus tachycardia Low voltage QRS No significant  change since last tracing Confirmed by Anitra Lauth  MD, Alphonzo Lemmings ([id number]) on  04/25/2014 5:09:52 PM      MDM   Final diagnoses:  None   Patient presents today with epigastric tenderness that she's had for the last week for the last 2 days it moved up into the upper epigastrium. She saw her PCP today who sent her here for further evaluation. Patient does have a significant history of coronary artery disease with  recent stents placed in September. She denies any change in her respiratory status despite having COPD there is no new cough or fevers.  She denies any nausea, diaphoresis.  She states the symptoms are not worse with exertion or lying flat. Pain does not feel any different with eating.    Patient's initial labs are within normal limits. EKG is unchanged except for mildly tachycardic. In the room patient's heart rate is between 105-110. She has mild epigastric tenderness. Also on further questioning patient states that she has been using ibuprofen every day for the last 1-2 weeks for ongoing headache. This seems to be more GI in nature specially with an unchanged EKG and normal chest x-ray and negative troponin.  Patient given IV fluids and a GI cocktail.  8:12 PM Tachycardia improved after IV fluids. No significant change and epigastric pain with GI cocktail. Repeat troponin at 3 hours was negative. At this time doubt that patient's symptoms are related to a cardiac cause. Will start on Carafate and have her continue Protonix.  Patient will follow-up with Dr. Eden Emms and PCP next week if symptoms continue        Gwyneth Sprout, MD 04/25/14 2013

## 2014-04-25 NOTE — ED Notes (Signed)
The pt is c/o epigastric pain for 2 days. She took a sl nitro today and the pain was better.  Stents  Placed in her coronary arteries.

## 2014-05-02 ENCOUNTER — Ambulatory Visit (INDEPENDENT_AMBULATORY_CARE_PROVIDER_SITE_OTHER): Payer: Medicare Other | Admitting: Internal Medicine

## 2014-05-02 ENCOUNTER — Encounter: Payer: Self-pay | Admitting: Internal Medicine

## 2014-05-02 VITALS — BP 104/66 | HR 115 | Ht 66.0 in | Wt 214.8 lb

## 2014-05-02 DIAGNOSIS — G4734 Idiopathic sleep related nonobstructive alveolar hypoventilation: Secondary | ICD-10-CM

## 2014-05-02 DIAGNOSIS — J449 Chronic obstructive pulmonary disease, unspecified: Secondary | ICD-10-CM

## 2014-05-02 DIAGNOSIS — IMO0001 Reserved for inherently not codable concepts without codable children: Secondary | ICD-10-CM

## 2014-05-02 NOTE — Patient Instructions (Addendum)
Restart BREO one puff each am - use samples until you return and we sort out what you need   Only use your albuterol (proair) as a rescue medication to be used if you can't catch your breath by resting or doing a relaxed purse lip breathing pattern.  - The less you use it, the better it will work when you need it. - Ok to use up to 2 puffs  every 4 hours if you must but call for immediate appointment if use goes up over your usual need - Don't leave home without it !!  (think of it like the spare tire for your car)   Use the med calendar we generated today and any changes between visits should be immediately entered so it stays updated continuously  See Tammy NP w/in 4 weeks with all your medications and your drug formulary, even over the counter meds, separated in two separate bags, the ones you take no matter what vs the ones you stop once you feel better and take only as needed when you feel you need them.   Tammy  will generate for you a new user friendly medication calendar that will put us all on the same page re: your medication use.     Without this process, it simply isn't possible to assure that we are providing  your outpatient care  with  the attention to detail we feel you deserve.   If we cannot assure that you're getting that kind of care,  then we cannot manage your problem effectively from this clinic.  Once you have seen Tammy and we are sure that we're all on the same page with your medication use she will arrange follow up with me.  Late add :  Consider pulmonary rehab next ov

## 2014-05-02 NOTE — Progress Notes (Signed)
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 rec Prilosec Take 30-60 min before first meal of the day  Stop all inhalers except ventolin and use it up to 2 puffs every 4 hours if can't catch your breath Anoro open once and take two dep drags each am as soon as you get up each day    10/02/2013 f/u ov/Patricia Wall re: GOLD II copd / seeing multiple disconnected emr docs/ has 02 at hs ? 3lpm Chief Complaint  Patient presents with  . Follow-up    Pt c/o increased SOB and cough for the past 2-3 wks.   med sheets are incorrect, no longer on any BB/ over using saba to point where using 10-12 x per day  No brown sputum x 2 weeks.    >>rx incruse and zebeta in place of spiriva and coreg.   10/15/13 Follow up and Med review  Returns for follow up and med review .  Daughter is with her today that helps with meds .  Feels better on inhalers but insurance will not cover.   Medicaid/Medicare will not cover the Incruse or Zebeta We reviewed all her medications organized them into a medication calendar with patient education. rec Follow med calendar closely and bring to each visit.  Prevnar Vaccine today.  Restart Carvedilol 3.125mg  Twice daily  (Since insurance would not cover Bisoprolol)  Restart Spiriva 1 puff daily (Since Insurance would not cover Incruse)     01/27/2014 Follow up and Medication Review (COPD )  Patient  returns for a one-month follow-up. We reviewed all her medications organize them into a medication calendar with patient education. It appears that she is taking her medications correctly. Patient had a COPD exacerbation. Last visit. She was treated with Augmentin and prednisone taper. Patient reports that she is improved. She reports that she has good days and bad days. She denies any hemoptysis, orthopnea, PND, leg swelling or discolored mucus.  rec Follow med calendar closely and bring to each visit.  Continue on BREO and Spiriva 1 puff daily     05/02/2014 f/u ov/Patricia Wall re: GOLD II copd / not consistent with meds / no med calendar / multiple new meds by other docs  Chief Complaint  Patient presents with  . Follow-up    Pt states that her breathing is unchanged since the last visit. She states that she is using albuterol about 2 x per day.   sleeps on 2lpm ok and denies early am exac of cough/ wheeze Not sure if breo helped breathing or reduced saba dependency but insurance doesn't cover anyway  C/o doe x  More than slow adls no longer walking outdoors much   No obvious day to day or daytime variabilty or assoc chronic cough or cp or chest tightness, subjective wheeze overt sinus or  hb symptoms. No unusual exp hx or h/o childhood pna/ asthma or knowledge of premature birth.  Sleeping ok without nocturnal  or early am exacerbation  of respiratory  c/o's or need for noct saba. Also denies any obvious fluctuation of symptoms with weather or environmental changes or other aggravating or alleviating factors except as outlined above   Current Medications, Allergies, Complete Past Medical History, Past Surgical History, Family History, and Social History were reviewed in [employer] record.  ROS  The following are not active complaints unless bolded sore throat, dysphagia, dental problems, itching, sneezing,  nasal congestion or excess/ purulent secretions, ear ache,   fever,  chills, sweats, unintended wt loss, pleuritic or exertional cp, hemoptysis,  orthopnea pnd or leg swelling, presyncope, palpitations, heartburn, abdominal pain, anorexia, nausea, vomiting, diarrhea  or change in bowel or urinary habits, change in stools or urine, dysuria,hematuria,  rash, arthralgias, visual complaints, headache, numbness weakness or ataxia or problems with walking or coordination,  change in mood/affect or memory.                                     Objective:  Physical Exam  amb wf nad    04/02/2013       198  >   10/02/2013   207 >  201 10/15/13 > 12/30/2013  207 >209 01/27/2014 > 05/02/2014 215     HEENT mild turbinate edema.  Oropharynx no thrush or excess pnd or cobblestoning.  No JVD or cervical adenopathy. Mild accessory muscle hypertrophy. Trachea midline, nl thryroid. Chest was hyperinflated by percussion with diminished breath sounds in bases   Regular rate and rhythm without murmur gallop or rub or increase P2 or edema.  Abd: no hsm, nl excursion. Ext warm without cyanosis or clubbing.             CXR:  04/25/14  I personally reviewed images and agree with radiology impression as follows:  There is mild scarring in the left base. Lungs elsewhere are clear. Heart size and pulmonary vascularity are normal. No adenopathy. No pneumothorax. No bone lesions.      Assessment:

## 2014-05-03 ENCOUNTER — Encounter: Payer: Self-pay | Admitting: Internal Medicine

## 2014-05-03 NOTE — Assessment & Plan Note (Signed)
-   See ono 09/29/13 > desat x 54 min < 89% > rec 2lpm   Adequate control on present rx, reviewed > no change in rx needed

## 2014-05-03 NOTE — Assessment & Plan Note (Signed)
-   pft's 12/12/12 FEV1  1.41 (53%) ratio 54 with no significant reversibility and DLCO 47% corrects to 50 01/16/2013  Walked RA x 2 laps @ 185 ft each stopped due to sob, no desat     - Trial of anoro 01/16/2013 > helped but insurance would not pay - incruse one daily started 10/02/2013 >>>not covered  -Spiriva restarted 10/15/13 > [facility] but not consistent with it  10/15/13 Med calendar , 01/27/2014 > lost it at f/u 05/02/14  The proper method of use, as well as anticipated side effects, of a metered-dose inhaler are discussed and demonstrated to the patient. Improved effectiveness after extensive coaching during this visit to a level of approximately  90% with Breo/ 75% with hfa   I had an extended discussion with the patient reviewing all relevant studies completed to date and  lasting 15 to 20 minutes of a 25 minute visit on the following ongoing concerns:  1) Unlike when you get a prescription for eyeglasses, it's not possible to always walk out of this or any medical office with a perfect prescription that is immediately effective  based on any test that we offer here.    On the contrary, it may take several weeks for the full impact of changes recommened today - hopefully you will respond well.  If not, then we'll adjust your medication on your next visit accordingly, knowing more then than we can possibly know now.     2) critical that we stay on the same page with her in terms of medication administration, esp when seeing doctors outside the [facility]  3) Each maintenance medication was reviewed in detail including most importantly the difference between maintenance and as needed and under what circumstances the prns are to be used. This was done in the context of a medication calendar review which provided the patient with a user-friendly unambiguous mechanism for medication administration and reconciliation and provides an action plan for all active problems. It is critical  that this be shown to every doctor  for modification during the office visit if necessary so the patient can use it as a working document.      4) will add back BREO to see if activity tol better, saba use less and f/u for a trust but verify ov before additional changes or recs  5) strongly consier  Pulmonary rehab

## 2014-05-21 ENCOUNTER — Other Ambulatory Visit: Payer: Self-pay | Admitting: Radiology

## 2014-05-21 DIAGNOSIS — I6523 Occlusion and stenosis of bilateral carotid arteries: Secondary | ICD-10-CM

## 2014-05-30 ENCOUNTER — Encounter: Payer: Self-pay | Admitting: Adult Health

## 2014-05-30 ENCOUNTER — Ambulatory Visit (INDEPENDENT_AMBULATORY_CARE_PROVIDER_SITE_OTHER): Payer: Medicare Other | Admitting: Adult Health

## 2014-05-30 VITALS — BP 104/62 | HR 108 | Ht 66.0 in | Wt 211.2 lb

## 2014-05-30 DIAGNOSIS — I6523 Occlusion and stenosis of bilateral carotid arteries: Secondary | ICD-10-CM

## 2014-05-30 DIAGNOSIS — J449 Chronic obstructive pulmonary disease, unspecified: Secondary | ICD-10-CM | POA: Diagnosis not present

## 2014-05-30 DIAGNOSIS — IMO0001 Reserved for inherently not codable concepts without codable children: Secondary | ICD-10-CM

## 2014-05-30 MED ORDER — BUDESONIDE-FORMOTEROL FUMARATE 160-4.5 MCG/ACT IN AERO: 2.0000 | INHALATION_SPRAY | Freq: Two times a day (BID) | RESPIRATORY_TRACT | Status: DC

## 2014-05-30 NOTE — Patient Instructions (Signed)
Follow med calendar closely and bring to each visit.  Begin Symbicort in place of BREO due to insurance  Continue on Spiriva 1 puff daily  Follow up Dr. Sherene SiresWert  In 3 months and As needed

## 2014-06-03 ENCOUNTER — Ambulatory Visit (HOSPITAL_COMMUNITY): Payer: Medicare Other | Attending: Cardiovascular Disease | Admitting: Cardiology

## 2014-06-03 DIAGNOSIS — I6523 Occlusion and stenosis of bilateral carotid arteries: Secondary | ICD-10-CM | POA: Diagnosis not present

## 2014-06-03 NOTE — Progress Notes (Signed)
Carotid duplex performed 

## 2014-06-04 NOTE — Progress Notes (Signed)
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 rec Prilosec Take 30-60 min before first meal of the day  Stop all inhalers except ventolin and use it up to 2 puffs every 4 hours if can't catch your breath Anoro open once and take two dep drags each am as soon as you get up each day    10/02/2013 f/u ov/Wert re: GOLD II copd / seeing multiple disconnected emr docs/ has 02 at hs ? 3lpm Chief Complaint  Patient presents with  . Follow-up    Pt c/o increased SOB and cough for the past 2-3 wks.   med sheets are incorrect, no longer on any BB/ over using saba to point where using 10-12 x per day  No brown sputum x 2 weeks.    >>rx incruse and zebeta in place of spiriva and coreg.   10/15/13 Follow up and Med review  Returns for follow up and med review .  Daughter is with her today that helps with meds .  Feels better on inhalers but insurance will not cover.   Medicaid/Medicare will not cover the Incruse or Zebeta We reviewed all her medications organized them into a medication calendar with patient education. rec Follow med calendar closely and bring to each visit.  Prevnar Vaccine today.  Restart Carvedilol 3.125mg  Twice daily  (Since insurance would not cover Bisoprolol)  Restart Spiriva 1 puff daily (Since Insurance would not cover Incruse)     01/27/2014 Follow up and Medication Review (COPD )  Patient  returns for a one-month follow-up. We reviewed all her medications organize them into a medication calendar with patient education. It appears that she is taking her medications correctly. Patient had a COPD exacerbation. Last visit. She was treated with Augmentin and prednisone taper. Patient reports that she is improved. She reports that she has good days and bad days. She denies any hemoptysis, orthopnea, PND, leg swelling or discolored mucus.  rec Follow med calendar closely and bring to each visit.  Continue on BREO and Spiriva 1 puff daily     05/02/2014 f/u ov/Wert re: GOLD II copd / not consistent with meds / no med calendar / multiple new meds by other docs  Chief Complaint  Patient presents with  . Follow-up    Pt states that her breathing is unchanged since the last visit. She states that she is using albuterol about 2 x per day.   sleeps on 2lpm ok and denies early am exac of cough/ wheeze Not sure if breo helped breathing or reduced saba dependency but insurance doesn't cover anyway  C/o doe x  More than slow adls no longer walking outdoors much  >>restart BREO .   05/30/14 Follow up  GOLD II copd and Med Review  Returns for follow up and med review  We reviewed all her meds and organized them into a med calendar  Insurance will not cover BREO any more.  Recent fall with rib injury  Seen at ER in [place] told she has cracked rib.  Getting better but still very sore.  No chest pain,orthopnea, edema or fever.    Current Medications, Allergies, Complete Past Medical History, Past Surgical History, Family History, and Social History were reviewed in [facility] electronic medical record.  ROS  The following are not active complaints unless bolded sore throat, dysphagia, dental problems, itching, sneezing,  nasal congestion or excess/ purulent secretions, ear ache,   fever, chills, sweats, unintended wt loss, pleuritic or exertional cp, hemoptysis,  orthopnea pnd or leg  swelling, presyncope, palpitations, heartburn, abdominal pain, anorexia, nausea, vomiting, diarrhea  or change in bowel or urinary habits, change in stools or urine, dysuria,hematuria,  rash, arthralgias, visual complaints, headache, numbness weakness or ataxia or problems with walking or coordination,  change in mood/affect or memory.                     Objective:  Physical Exam  amb wf nad    04/02/2013       198  >   10/02/2013   207 >  201 10/15/13 > 12/30/2013  207 >209 01/27/2014 > 05/02/2014 215>211 05/30/14      HEENT mild turbinate edema.  Oropharynx no thrush or excess pnd or cobblestoning.  No JVD or cervical adenopathy. Mild accessory muscle hypertrophy. Trachea midline, nl thryroid. Chest was hyperinflated by percussion with diminished breath sounds in bases   Regular rate and rhythm without murmur gallop or rub or increase P2 or edema.    Abd: no hsm, nl excursion. Ext warm without cyanosis or clubbing.             CXR:  04/25/14  I personally reviewed images and agree with radiology impression as follows:  There is mild scarring in the left base. Lungs elsewhere are clear. Heart size and pulmonary vascularity are normal. No adenopathy. No pneumothorax. No bone lesions.      Assessment:

## 2014-06-04 NOTE — Assessment & Plan Note (Addendum)
Patient's medications were reviewed today and patient education was given. Computerized medication calendar was adjusted/completed   PLAN  Follow med calendar closely and bring to each visit.  Begin Symbicort in place of BREO due to insurance  Continue on Spiriva 1 puff daily  Follow up Dr. Sherene SiresWert  In 3 months and As needed   Refer to pulm rehab

## 2014-06-04 NOTE — Addendum Note (Signed)
Addended by: Boone MasterJONES, JESSICA E on: 06/04/2014 01:38 PM   Modules accepted: Orders

## 2014-06-09 ENCOUNTER — Other Ambulatory Visit: Payer: Self-pay | Admitting: Adult Health

## 2014-06-25 ENCOUNTER — Encounter: Payer: Self-pay | Admitting: Cardiovascular Disease

## 2014-06-25 ENCOUNTER — Ambulatory Visit (INDEPENDENT_AMBULATORY_CARE_PROVIDER_SITE_OTHER): Payer: Medicare Other | Admitting: Cardiovascular Disease

## 2014-06-25 ENCOUNTER — Ambulatory Visit (INDEPENDENT_AMBULATORY_CARE_PROVIDER_SITE_OTHER)
Admission: RE | Admit: 2014-06-25 | Discharge: 2014-06-25 | Disposition: A | Discharge status: Home / Self Care | Payer: Medicare Other | Source: Ambulatory Visit | Attending: Cardiovascular Disease | Admitting: Cardiovascular Disease

## 2014-06-25 VITALS — BP 138/76 | HR 120 | Ht 66.0 in | Wt 211.8 lb

## 2014-06-25 DIAGNOSIS — R059 Cough, unspecified: Secondary | ICD-10-CM

## 2014-06-25 DIAGNOSIS — IMO0001 Reserved for inherently not codable concepts without codable children: Secondary | ICD-10-CM

## 2014-06-25 DIAGNOSIS — R05 Cough: Secondary | ICD-10-CM

## 2014-06-25 DIAGNOSIS — J449 Chronic obstructive pulmonary disease, unspecified: Secondary | ICD-10-CM | POA: Diagnosis not present

## 2014-06-25 DIAGNOSIS — I6523 Occlusion and stenosis of bilateral carotid arteries: Secondary | ICD-10-CM | POA: Diagnosis not present

## 2014-06-25 DIAGNOSIS — Z8679 Personal history of other diseases of the circulatory system: Secondary | ICD-10-CM

## 2014-06-25 DIAGNOSIS — I739 Peripheral vascular disease, unspecified: Secondary | ICD-10-CM

## 2014-06-25 DIAGNOSIS — Z9861 Coronary angioplasty status: Secondary | ICD-10-CM | POA: Diagnosis not present

## 2014-06-25 DIAGNOSIS — I2510 Atherosclerotic heart disease of native coronary artery without angina pectoris: Secondary | ICD-10-CM

## 2014-06-25 DIAGNOSIS — R000 Tachycardia, unspecified: Secondary | ICD-10-CM

## 2014-06-25 MED ORDER — GUAIFENESIN-CODEINE 100-10 MG/5ML PO SOLN: 10.0000 mL | Freq: Three times a day (TID) | ORAL | Status: DC | PRN

## 2014-06-25 NOTE — Progress Notes (Addendum)
Patient ID: Patricia Wall   DOB: Feb 13, 1951, 64 y.o.   MRN: [id number] 64 year old Wall patient who has history of CAD status post previous Cypher stent to the LAD, Cypher stent to the distal circumflex and OM 2 branch PTCA in 2005. She had ISR and had balloon angioplasty for restenosis of the circumflex in 2006. Last cath was 6/2011and showed patent stents and no critical stenosis. She also has severe COPD followed by Dr. 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 She has had recurrent syncope and is being followed by a neurologist. She was seen as an OP 12/02/13 with complaints of chest tightness and exertional dyspnea. She was admitted for cath and underwent 2 site CFX AV groove PCI with DES on 12/03/13. She tolerated this well.   Subsequently has had severe back pain She had kyphoplasty in [place] recently but did not stop her plavix for it   Reviewed duplex 05/2014  60-79% LICA  Needs f/u duplex September 2016   Has had bad bronchitis Still coughing unable to sleep Just finished prednisone and antibiotics but no better  ROS: Denies fever, malais, weight loss, blurry vision, decreased visual acuity, cough, sputum, SOB, hemoptysis, pleuritic pain, palpitaitons, heartburn, abdominal pain, melena, lower extremity edema, claudication, or rash.  All other systems reviewed and negative  General: Affect appropriate Chronically ill Wall in wheel chair HEENT: normal Neck supple with no adenopathy JVP normal left bruits no thyromegaly Lungs clear with no wheezing and good diaphragmatic motion Heart:  S1/S2 no murmur, no rub, gallop or click PMI normal Abdomen: benighn, BS positve, no tenderness, no AAA no bruit.  No HSM or HJR Distal pulses intact with no bruits No edema Neuro non-focal Skin warm and dry Bilateral LE weakness    Current Outpatient Prescriptions  Medication Sig Dispense Refill  . acetaminophen (TYLENOL) 325 MG tablet Take 2 tablets (650 mg total) by mouth every 4 (four)  hours as needed for headache or mild pain.    [name]. albuterol (PROAIR HFA) 108 (90 BASE) MCG/ACT inhaler Inhale 2 puffs into the lungs every 4 (four) hours as needed for wheezing or shortness of breath.    [name]. aspirin 81 MG tablet Take 81 mg by mouth at bedtime.     [name]. atorvastatin (LIPITOR) 40 MG tablet Take 40 mg by mouth at bedtime.    . baclofen (LIORESAL) 10 MG tablet Take 10 mg by mouth 3 (three) times daily.    [name]. BREO ELLIPTA 100-25 MCG/INH AEPB INHALE 1 PUFF INTO THE LUNGS EVERY MORNING 60 each 5  . budesonide-formoterol (SYMBICORT) 160-4.5 MCG/ACT inhaler Inhale 2 puffs into the lungs 2 (two) times daily. 1 Inhaler 0  . budesonide-formoterol (SYMBICORT) 160-4.5 MCG/ACT inhaler Inhale 2 puffs into the lungs 2 (two) times daily. 10.6 g 5  . calcium carbonate (OS-CAL) 600 MG TABS Take 600 mg by mouth daily.      . carvedilol (COREG) 3.125 MG tablet TAKE 1 TABLET (3.125 MG TOTAL) BY MOUTH 2 (TWO) TIMES DAILY WITH A MEAL. 60 tablet 5  . Cholecalciferol (VITAMIN D) 2000 UNITS tablet Take 2,000 Units by mouth 2 (two) times daily at 8 am and 10 pm.     . clopidogrel (PLAVIX) 75 MG tablet Take 75 mg by mouth daily.      . DULoxetine (CYMBALTA) 60 MG capsule Take 60 mg by mouth 2 (two) times daily.    [name]. gabapentin (NEURONTIN) 300 MG capsule 3 capsules by mouth at bedtime    . letrozole (  FEMARA) 2.5 MG tablet Take 2.5 mg by mouth daily.    . metFORMIN (GLUCOPHAGE) 500 MG tablet Take 500 mg by mouth 2 (two) times daily.    . Multiple Vitamin (MULTIVITAMIN) tablet Take 1 tablet by mouth daily.    . niacin (NIASPAN) 1000 MG CR tablet 2 tabs by mouth at bedtime    . nitroGLYCERIN (NITROSTAT) 0.4 MG SL tablet Place 0.4 mg under the tongue every 5 (five) minutes as needed for chest pain (may repeat x3).     [name] omeprazole (PRILOSEC) 40 MG capsule Take 40 mg by mouth daily.    . ondansetron (ZOFRAN) 8 MG tablet Take 8 mg by mouth every 8 (eight) hours as needed for nausea or vomiting.    [name] SPIRIVA HANDIHALER 18  MCG inhalation capsule PLACE 1 CAPSULE (18 MCG TOTAL) INTO INHALER AND INHALE DAILY. 30 capsule 6  . sucralfate (CARAFATE) 1 G tablet Take 1 tablet (1 g total) by mouth 4 (four) times daily -  with meals and at bedtime. 60 tablet 0   No current facility-administered medications for this visit.    Allergies  Review of patient's allergies indicates no known allergies.  Electrocardiogram:  04/26/14  SR rate 100 low voltage otherwise normal  06/25/14   ST rate 111 low voltage no AFib or MAT  Assessment and Plan CAD:  Stable with no angina and good activity level.  Continue medical Rx COPD:  CXR today will call in robitussin with codien  Called pulmonary to see Dr Sherene Sires sooner Has nebulizer and oxygen at home DM:  Discussed low carb diet.  Target hemoglobin A1c is 6.5 or less.  Continue current medications. Chol:   Cholesterol is at goal.  Continue current dose of statin and diet Rx.  No myalgias or side effects.  F/U  LFT's in 6 months. Lab Results  Component Value Date   [facility]  08/29/2009    63        Total Cholesterol/HDL:CHD Risk Coronary Heart Disease Risk Table                     Men   Women  1/2 Average Risk   3.4   3.3  Average Risk       5.0   4.4  2 X Average Risk   9.6   7.1  3 X Average Risk  23.4   11.0        Use the calculated Patient Ratio above and the CHD Risk Table to determine the patient's CHD Risk.        ATP III CLASSIFICATION (LDL):  <100     mg/dL   Optimal  161-096  mg/dL   Near or Above                    Optimal  130-159  mg/dL   Borderline  045-409  mg/dL   High  >811     mg/dL   Very High

## 2014-06-25 NOTE — Assessment & Plan Note (Signed)
CXR today f/u Dr Sherene SiresWert soon to see and make appt Robitussin with codeine script given  Known to go to ER for high fevers or respiratory distress

## 2014-06-25 NOTE — Assessment & Plan Note (Signed)
Stable with no angina and good activity level.  Continue medical Rx  

## 2014-06-25 NOTE — Patient Instructions (Addendum)
Your physician wants you to follow-up in:   6 MONTHS  WITH  DR Haywood FillerNISHAN  You will receive a reminder letter in the mail two months in advance. If you don't receive a letter, please call our office to schedule the follow-up appointment.   Your physician recommends that you schedule a follow-up appointment in:  DR  WERT   ?  BRONCHITIS   A chest x-ray takes a picture of the organs and structures inside the chest, including the heart, lungs, and blood vessels. This test can show several things, including, whether the heart is enlarges; whether fluid is building up in the lungs; and whether pacemaker / defibrillator leads are still in place.

## 2014-06-25 NOTE — Assessment & Plan Note (Signed)
No claudication activity limited by breathing  F/u ABI"s in a year

## 2014-06-25 NOTE — Addendum Note (Signed)
Addended by: Scherrie BatemanYORK, Victorian Gunn E on: 06/25/2014 02:49 PM   Modules accepted: Orders

## 2014-06-25 NOTE — Assessment & Plan Note (Signed)
Known moderate carotid disease ASA f/u duplex September

## 2014-06-26 ENCOUNTER — Telehealth: Payer: Self-pay | Admitting: Cardiovascular Disease

## 2014-06-26 NOTE — Telephone Encounter (Signed)
PT AWARE OF  CXR  RESULTS .Zack Seal/CY

## 2014-06-26 NOTE — Telephone Encounter (Addendum)
Pt calling to see if  results are in-pls call [phone]

## 2014-07-07 ENCOUNTER — Ambulatory Visit: Payer: Medicare Other | Admitting: Pulmonary Disease

## 2014-07-07 ENCOUNTER — Ambulatory Visit: Payer: Medicare Other | Admitting: Adult Health

## 2014-07-25 ENCOUNTER — Ambulatory Visit (INDEPENDENT_AMBULATORY_CARE_PROVIDER_SITE_OTHER)
Admission: RE | Admit: 2014-07-25 | Discharge: 2014-07-25 | Disposition: A | Discharge status: Home / Self Care | Payer: Medicare Other | Source: Ambulatory Visit | Attending: Internal Medicine | Admitting: Internal Medicine

## 2014-07-25 ENCOUNTER — Ambulatory Visit (INDEPENDENT_AMBULATORY_CARE_PROVIDER_SITE_OTHER): Payer: Medicare Other | Admitting: Internal Medicine

## 2014-07-25 ENCOUNTER — Encounter: Payer: Self-pay | Admitting: Internal Medicine

## 2014-07-25 ENCOUNTER — Telehealth: Payer: Self-pay | Admitting: Internal Medicine

## 2014-07-25 VITALS — BP 100/74 | HR 104 | Ht 65.0 in | Wt 214.0 lb

## 2014-07-25 DIAGNOSIS — I10 Essential (primary) hypertension: Secondary | ICD-10-CM | POA: Diagnosis not present

## 2014-07-25 DIAGNOSIS — J449 Chronic obstructive pulmonary disease, unspecified: Secondary | ICD-10-CM

## 2014-07-25 DIAGNOSIS — IMO0001 Reserved for inherently not codable concepts without codable children: Secondary | ICD-10-CM

## 2014-07-25 DIAGNOSIS — I6523 Occlusion and stenosis of bilateral carotid arteries: Secondary | ICD-10-CM

## 2014-07-25 MED ORDER — AZITHROMYCIN 250 MG PO TABS: ORAL_TABLET | ORAL | Status: DC

## 2014-07-25 MED ORDER — FLUTTER DEVI: Status: DC

## 2014-07-25 MED ORDER — PREDNISONE 10 MG PO TABS: ORAL_TABLET | ORAL | Status: DC

## 2014-07-25 NOTE — Telephone Encounter (Signed)
lmtcb for pt.   Notes Recorded by Nyoka CowdenMichael B Wert, MD on 07/25/2014 at 1:11 PM Call pt: Reviewed cxr and no acute change so [facility]

## 2014-07-25 NOTE — Patient Instructions (Addendum)
zpak Prednisone 10 mg take  4 each am x 2 days,   2 each am x 2 days,  1 each am x 2 days and stop   Reduce the carvdilol to one-half twice daily   For cough mucinex dm up to 1200 mg every 12 hours and the flutter valve   Please remember to go to the x-ray department downstairs for your tests - we will call you with the results when they are available.     See Tammy NP w/in 4 weeks with all your medications, even over the counter meds, separated in two separate bags, the ones you take no matter what vs the ones you stop once you feel better and take only as needed when you feel you need them.   Tammy  will generate for you a new user friendly medication calendar that will put us all on the same page re: your medication use.     Without this process, it simply isn't possible to assure that we are providing  your outpatient care  with  the attention to detail we feel you deserve.   If we cannot assure that you're getting that kind of care,  then we cannot manage your problem effectively from this clinic.  Once you have seen Tammy and we are sure that we're all on the same page with your medication use she will arrange follow up with me.

## 2014-07-25 NOTE — Progress Notes (Signed)
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 rec Prilosec Take 30-60 min before first meal of the day  Stop all inhalers except ventolin and use it up to 2 puffs every 4 hours if can't catch your breath Anoro open once and take two dep drags each am as soon as you get up each day    10/02/2013 f/u ov/Wert re: GOLD II copd / seeing multiple disconnected emr docs/ has 02 at hs ? 3lpm Chief Complaint  Patient presents with  . Follow-up    Pt c/o increased SOB and cough for the past 2-3 wks.   med sheets are incorrect, no longer on any BB/ over using saba to point where using 10-12 x per day  No brown sputum x 2 weeks.    >>rx incruse and zebeta in place of spiriva and coreg.   10/15/13 Follow up and Med review  Returns for follow up and med review .  Daughter is with her today that helps with meds .  Feels better on inhalers but insurance will not cover.   Medicaid/Medicare will not cover the Incruse or Zebeta We reviewed all her medications organized them into a medication calendar with patient education. rec Follow med calendar closely and bring to each visit.  Prevnar Vaccine today.  Restart Carvedilol 3.125mg  Twice daily  (Since insurance would not cover Bisoprolol)  Restart Spiriva 1 puff daily (Since Insurance would not cover Incruse)     01/27/2014 Follow up and Medication Review (COPD )  Patient  returns for a one-month follow-up. We reviewed all her medications organize them into a medication calendar with patient education. It appears that she is taking her medications correctly. Patient had a COPD exacerbation. Last visit. She was treated with Augmentin and prednisone taper. Patient reports that she is improved. She reports that she has good days and bad days. She denies any hemoptysis, orthopnea, PND, leg swelling or discolored mucus.  rec Follow med calendar closely and bring to each visit.  Continue on BREO and Spiriva 1 puff daily     05/02/2014 f/u ov/Wert re: GOLD II copd / not consistent with meds / no med calendar / multiple new meds by other docs  Chief Complaint  Patient presents with  . Follow-up    Pt states that her breathing is unchanged since the last visit. She states that she is using albuterol about 2 x per day.   sleeps on 2lpm ok and denies early am exac of cough/ wheeze Not sure if breo helped breathing or reduced saba dependency but insurance doesn't cover anyway  C/o doe x  More than slow adls no longer walking outdoors much  >>restart BREO .   05/30/14 NP Follow up  GOLD II copd and Med Review  Returns for follow up and med  review  We reviewed all her meds and organized them into a med calendar  Insurance will not cover BREO any more.  Recent fall with rib injury  Seen at ER in [place] told she has cracked rib.  Getting better but still very sore.  No chest pain,orthopnea, edema or fever.  rec Follow med calendar closely and bring to each visit.  Begin Symbicort in place of BREO due to insurance  Continue on Spiriva 1 puff daily    07/25/2014  Acute  ov/Wert re: aecopd/ GOLD II  On coreg/ using top part of med calendar but not the prns at bottom  Chief Complaint  Patient presents with  . Acute Visit    prod cough w/green mucus; passing out when coughing; chest congestion; chest tightness; SOB at all times  was doing fine on breo then gradually   worse since changed to symbicort with poor hfa   Cough /breathing worse in am's, has sense of sob even at rest/ harsh upper airway quality coughing   No obvious day to day or daytime variabilty or assoc chronic cough or cp or chest tightness, subjective wheeze overt sinus or hb symptoms. No unusual exp hx or h/o childhood pna/ asthma or knowledge of premature birth.  Sleeping ok without nocturnal  or early am exacerbation  of respiratory  c/o's or need for noct saba. Also denies any obvious fluctuation of symptoms with weather or environmental changes or other aggravating or alleviating factors except as outlined above   Current Medications, Allergies, Complete Past Medical History, Past Surgical History, Family History, and Social History were reviewed in [facility] electronic medical record.  ROS  The following are not active complaints unless bolded sore throat, dysphagia, dental problems, itching, sneezing,  nasal congestion or excess/ purulent secretions, ear ache,   fever, chills, sweats, unintended wt loss, pleuritic or exertional cp, hemoptysis,  orthopnea pnd or leg swelling, presyncope, palpitations, heartburn, abdominal pain, anorexia, nausea, vomiting, diarrhea  or change in bowel or urinary habits, change in stools or urine, dysuria,hematuria,  rash, arthralgias, visual complaints, headache, numbness weakness or ataxia or problems with walking or coordination,  change in mood/affect or memory.            Objective:  Physical Exam  amb wf nad until starts cough to point of gag   04/02/2013       198  >   10/02/2013   207 >  201 10/15/13 > 12/30/2013  207 >209 01/27/2014 > 05/02/2014 215>211 05/30/14 > 07/25/2014   214      HEENT mild turbinate edema.  Oropharynx no thrush or excess pnd or cobblestoning.  No JVD or cervical adenopathy. Mild accessory muscle hypertrophy. Trachea midline, nl thryroid. Chest was hyperinflated by percussion with diminished breath sounds in bases    Regular rate and rhythm without murmur gallop or rub or increase P2 or edema.    Abd: no hsm, nl excursion. Ext warm without cyanosis or clubbing.              CXR PA and Lateral:   07/25/2014 :     I personally reviewed images and agree with radiology impression as follows:      1. Persistent mild basilar interstitial prominence, mild pneumonitis cannot be excluded. Underlying chronic interstitial lung disease is most likely present. 2. Heart size stable. No pulmonary venous congestion    Assessment:

## 2014-07-26 ENCOUNTER — Encounter: Payer: Self-pay | Admitting: Internal Medicine

## 2014-07-26 NOTE — Assessment & Plan Note (Addendum)
-   pft's 12/12/12 FEV1  1.41 (53%) ratio 54 with no significant reversibility and DLCO 47% corrects to 50 01/16/2013  Walked RA x 2 laps @ 185 ft each stopped due to sob, no desat     - Trial of anoro 01/16/2013 > helped but insurance would not pay - incruse one daily started 10/02/2013 >>>not covered  -Spiriva restarted 10/15/13 > [facility] but not consistent with [facility]  10/15/13 Med calendar , 01/27/2014 > lost it at f/u 05/02/14 ,  Did not understand how to use it at f/u ov 07/25/14  05/30/14 refer to pulm rehab    DDX of  difficult airways management all start with A and  include Adherence, Ace Inhibitors, Acid Reflux, Active Sinus Disease, Alpha 1 Antitripsin deficiency, Anxiety masquerading as Airways dz,  ABPA,  allergy(esp in young), Aspiration (esp in elderly), Adverse effects of DPI,  Active smokers, plus two Bs  = Bronchiectasis and Beta blocker use..and one C= CHF   Adherence is always the initial "prime suspect" and is a multilayered concern that requires a "trust but verify" approach in every patient - starting with knowing how to use medications, especially inhalers, correctly, keeping up with refills and understanding the fundamental difference between maintenance and prns vs those medications only taken for a very short course and then stopped and not refilled.  - still not using med calendar correctly - The proper method of use, as well as anticipated side effects, of a metered-dose inhaler are discussed and demonstrated to the patient. Improved effectiveness after extensive coaching during this visit to a level of approximately  75%   ? Acid (or non-acid) GERD > always difficult to exclude as up to 75% of pts in some series report no assoc GI/ Heartburn symptoms> rec max (24h)  acid suppression and diet restrictions/ reviewed and instructions given in writing.   ? Allergy > Prednisone 10 mg take  4 each am x 2 days,   2 each am x 2 days,  1 each am x 2 days and stop  ? Active  sinus dz >zpak and low threshold for sinus ct  ? Adverse effect of dpi > prefer hfa over dpi here if she can learn it   I had an extended discussion with the patient reviewing all relevant studies completed to date and  lasting 15 to 20 minutes of a 25 minute visit on the following ongoing concerns:    Each maintenance medication was reviewed in detail including most importantly the difference between maintenance and as needed and under what circumstances the prns are to be used.  Please see instructions for details which were reviewed in writing and the patient given a copy.    I emphasized to the patient that just like Dorothy in [place],  She is already wearing "ruby slippers" she can click anytime she wants:  the answer to all her recurrent symptoms  is already  literally at her fingertips:   all she has to do is refer to the medicine calendar we provided her  and her problems  are each addressed in a user-friendly format, reading from left to right,  for each symptoms she's likely to develop between office visits.

## 2014-07-26 NOTE — Assessment & Plan Note (Signed)
Changed from coreg to bystolic 02/19/2013 for copd refractory to SABA - changed from bystolic to bisoprolol for insurance purposes 04/03/2013   Worse airway control with low bp and pre-syncope with cough back on coreg > rec at least reduce dose to 3.125 one half twice daily

## 2014-07-28 NOTE — Telephone Encounter (Signed)
lmtcb x2 

## 2014-07-28 NOTE — Telephone Encounter (Signed)
Patient notified of CXR results. Nothing further needed.  

## 2014-08-22 ENCOUNTER — Encounter: Payer: Medicare Other | Admitting: Adult Health

## 2014-10-11 ENCOUNTER — Other Ambulatory Visit: Payer: Self-pay | Admitting: Adult Health

## 2014-10-31 ENCOUNTER — Ambulatory Visit (INDEPENDENT_AMBULATORY_CARE_PROVIDER_SITE_OTHER): Payer: Medicare Other | Admitting: Internal Medicine

## 2014-10-31 ENCOUNTER — Encounter: Payer: Self-pay | Admitting: Internal Medicine

## 2014-10-31 VITALS — BP 108/66 | HR 125 | Ht 66.0 in | Wt 220.6 lb

## 2014-10-31 DIAGNOSIS — Z720 Tobacco use: Secondary | ICD-10-CM | POA: Diagnosis not present

## 2014-10-31 DIAGNOSIS — E669 Obesity, unspecified: Secondary | ICD-10-CM | POA: Diagnosis not present

## 2014-10-31 DIAGNOSIS — F17210 Nicotine dependence, cigarettes, uncomplicated: Secondary | ICD-10-CM | POA: Diagnosis not present

## 2014-10-31 DIAGNOSIS — I6523 Occlusion and stenosis of bilateral carotid arteries: Secondary | ICD-10-CM

## 2014-10-31 DIAGNOSIS — J441 Chronic obstructive pulmonary disease with (acute) exacerbation: Secondary | ICD-10-CM | POA: Diagnosis not present

## 2014-10-31 MED ORDER — PREDNISONE 10 MG PO TABS: ORAL_TABLET | ORAL | Status: DC

## 2014-10-31 MED ORDER — AZITHROMYCIN 250 MG PO TABS: ORAL_TABLET | ORAL | Status: DC

## 2014-10-31 NOTE — Patient Instructions (Addendum)
zpak Prednisone 10 mg take  4 each am x 2 days,   2 each am x 2 days,  1 each am x 2 days and stop   For cough mucinex dm up to 1200 mg every 12 hours and the flutter valve    See Tammy NP w/in 4 weeks with all your medications, even over the counter meds, separated in two separate bags, the ones you take no matter what vs the ones you stop once you feel better and take only as needed when you feel you need them.   Tammy  will generate for you a new user friendly medication calendar that will put [place] all on the same page re: your medication use.     Without this process, it simply isn't possible to assure that we are providing  your outpatient care  with  the attention to detail we feel you deserve.   If we cannot assure that you're getting that kind of care,  then we cannot manage your problem effectively from this clinic.  Once you have seen Tammy and we are sure that we're all on the same page with your medication use she will arrange follow up with me.

## 2014-10-31 NOTE — Progress Notes (Signed)
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 rec Prilosec Take 30-60 min before first meal of the day  Stop all inhalers except ventolin and use it up to 2 puffs every 4 hours if can't catch your breath Anoro open once and take two dep drags each am as soon as you get up each day    05/30/14 NP Follow up  GOLD II copd and Med Review  Returns for follow up and med review  We reviewed all her meds and organized them into a med calendar  Insurance will not cover BREO any more.  Recent fall with rib injury  Seen at ER in [place] told she has cracked rib.  Getting better but still very sore.  No chest pain,orthopnea, edema or fever.  rec Follow med calendar closely and bring to each visit.  Begin Symbicort in place of BREO due to insurance  Continue on Spiriva 1 puff daily    07/25/2014  Acute  ov/Wert re: aecopd/ GOLD II  On coreg/ using top part of med calendar but not the prns at bottom  Chief Complaint  Patient presents with  . Acute Visit    prod cough w/green mucus; passing out when coughing; chest congestion; chest tightness; SOB at all times  was doing fine on breo then gradually  worse since changed to symbicort with poor hfa  Cough /breathing worse in am's, has sense of sob even at rest/ harsh upper airway quality coughing  rec zpak Prednisone 10 mg take  4 each  am x 2 days,   2 each am x 2 days,  1 each am x 2 days and stop  Reduce the carevdilol to one-half twice daily  For cough mucinex dm up to 1200 mg every 12 hours and the flutter valve  See Tammy NP w/in 4 weeks> DID NOT DO     10/31/2014 f/u ov/Wert re:  [name] 2 COPD very confused with meds / instructions / no med calendar / supposed to be on symb/spiriva maint rx  Chief Complaint  Patient presents with  . Acute Visit    C/o increase SOB, prod cough (yellow phlem), wheezing, chest tx getting worse x 1 week     was better but resumed smoking then gradually worse x one week, still comfortable at rest p saba   No obvious day to day or daytime variabilty or assoc    cp   overt sinus or hb symptoms. No unusual exp hx or h/o childhood pna/ asthma or knowledge of premature birth.  Sleeping ok without nocturnal  or early am exacerbation  of respiratory  c/o's or need for noct saba. Also denies any obvious fluctuation of symptoms with weather or environmental changes or other aggravating or alleviating  factors except as outlined above   Current Medications, Allergies, Complete Past Medical History, Past Surgical History, Family History, and Social History were reviewed in [employer] record.  ROS  The following are not active complaints unless bolded sore throat, dysphagia, dental problems, itching, sneezing,  nasal congestion or excess/ purulent secretions, ear ache,   fever, chills, sweats, unintended wt loss, pleuritic or exertional cp, hemoptysis,  orthopnea pnd or leg swelling, presyncope, palpitations, heartburn, abdominal pain, anorexia, nausea, vomiting, diarrhea  or change in bowel or urinary habits, change in stools or urine, dysuria,hematuria,  rash, arthralgias, visual complaints, headache, numbness weakness or ataxia or problems with walking or coordination,  change in mood/affect or memory.            Objective:  Physical Exam  amb wf nad  In w/c    04/02/2013   198  >   10/02/2013   207 >  201 10/15/13 > 12/30/2013  207 >209 01/27/2014 > 05/02/2014 215>211 05/30/14 > 07/25/2014   214 > 10/31/2014  221      HEENT mild turbinate edema.  Edentulous   / orophynx clear  Nl external ear canals without cough reflex   NECK :  without JVD/Nodes/TM/ nl carotid upstrokes bilaterally   LUNGS: no acc muscle use,  distant bs bilaterally s wheeze   CV:  RRR  no s3 or murmur or increase in P2, no edema   ABD:  soft and nontender with nl excursion in the supine position. No bruits or organomegaly, bowel sounds nl  MS:  warm without deformities, calf tenderness, cyanosis or clubbing  SKIN: warm and dry without lesions    NEURO:  alert, approp, no deficits               CXR PA and Lateral:   07/25/2014 :     I personally reviewed images and agree with radiology impression as follows:    1. Persistent mild basilar interstitial prominence, mild pneumonitis cannot be excluded. Underlying chronic interstitial lung disease is most likely present. 2. Heart size stable. No pulmonary venous congestion    Assessment:

## 2014-11-01 ENCOUNTER — Encounter: Payer: Self-pay | Admitting: Internal Medicine

## 2014-11-01 DIAGNOSIS — E669 Obesity, unspecified: Secondary | ICD-10-CM | POA: Insufficient documentation

## 2014-11-01 NOTE — Assessment & Plan Note (Signed)
-   pft's 12/12/12 FEV1  1.41 (53%) ratio 54 with no significant reversibility and DLCO 47% corrects to 50 01/16/2013  Walked RA x 2 laps @ 185 ft each stopped due to sob, no desat     - Trial of anoro 01/16/2013 > helped but insurance would not pay - incruse one daily started 10/02/2013 >>>not covered  -Spiriva restarted 10/15/13 > changed to [facility] 12/30/13 but not consistent with it  10/15/13 Med calendar , 01/27/2014 > lost it at f/u 05/02/14 ,  Did not understand how to use it at f/u ov 07/25/14  05/30/14 refer to pulm rehab   Flare back on cigs/ strongly doubt compliance with complex rx and note also back on non-speicific BB  I had an extended discussion with the patient reviewing all relevant studies completed to date and  lasting 15 to 20 minutes of a 25 minute visit    rec zpak/ Prednisone 10 mg take  4 each am x 2 days,   2 each am x 2 days,  1 each am x 2 days and stop   nees med rec:  To keep things simple, I have asked the patient to first separate medicines that are perceived as maintenance, that is to be taken daily "no matter what", from those medicines that are taken on only on an as-needed basis and I have given the patient examples of both, and then return to see our NP to generate a  detailed  medication calendar which should be followed until the next physician sees the patient and updates it.    For today, Each maintenance medication was reviewed in detail including most importantly the difference between maintenance and prns and under what circumstances the prns are to be triggered using an action plan format that is not reflected in the computer generated alphabetically organized AVS.    Please see instructions for details which were reviewed in writing and the patient given a copy highlighting the part that I personally wrote and discussed at today's ov.

## 2014-11-01 NOTE — Assessment & Plan Note (Signed)
Says quit one day prior to OV  But I strongly suspect this is why she's flaring again  > 3 min discussion  I emphasized that although we never turn away smokers from the pulmonary clinic, we do ask that they understand that the recommendations that we make  won't work nearly as well in the presence of continued cigarette exposure.  In fact, we may very well  reach a point where we can't promise to help the patient if he/she can't quit smoking. (We can and will promise to try to help, we just can't promise what we recommend will really work)

## 2014-11-01 NOTE — Assessment & Plan Note (Signed)
Body mass index is 35.62 kg/(m^2).  Lab Results  Component Value Date   TSH 1.32 10/02/2013     Contributing to gerd tendency/ doe/reviewed need  achieve and maintain neg calorie balance > defer f/u primary care including intermittently monitoring thyroid status

## 2014-11-17 ENCOUNTER — Other Ambulatory Visit: Payer: Self-pay | Admitting: Adult Health

## 2014-11-28 ENCOUNTER — Encounter: Payer: Medicare Other | Admitting: Adult Health

## 2014-12-09 ENCOUNTER — Encounter: Payer: Self-pay | Admitting: Adult Health

## 2014-12-09 ENCOUNTER — Ambulatory Visit (INDEPENDENT_AMBULATORY_CARE_PROVIDER_SITE_OTHER): Payer: Medicare Other | Admitting: Adult Health

## 2014-12-09 VITALS — BP 116/76 | HR 118 | Temp 97.5°F | Ht 66.0 in | Wt 213.0 lb

## 2014-12-09 DIAGNOSIS — Z23 Encounter for immunization: Secondary | ICD-10-CM | POA: Diagnosis not present

## 2014-12-09 DIAGNOSIS — J449 Chronic obstructive pulmonary disease, unspecified: Secondary | ICD-10-CM

## 2014-12-09 DIAGNOSIS — IMO0001 Reserved for inherently not codable concepts without codable children: Secondary | ICD-10-CM

## 2014-12-09 NOTE — Patient Instructions (Addendum)
Flu shot today  Follow med calendar closely and bring to each visit.  Continue on current regimen .  follow up Dr. Sherene Sires  In 3 months and As needed

## 2014-12-10 NOTE — Progress Notes (Signed)
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 rec Prilosec Take 30-60 min before first meal of the day  Stop all inhalers except ventolin and use it up to 2 puffs every 4 hours if can't catch your breath Anoro open once and take two dep drags each am as soon as you get up each day    05/30/14 NP Follow up  GOLD II copd and Med Review  Returns for follow up and med review  We reviewed all her meds and organized them into a med calendar  Insurance will not cover BREO any more.  Recent fall with rib injury  Seen at ER in [place] told she has cracked rib.  Getting better but still very sore.  No chest pain,orthopnea, edema or fever.  rec Follow med calendar closely and bring to each visit.  Begin Symbicort in place of BREO due to insurance  Continue on Spiriva 1 puff daily    07/25/2014  Acute  ov/Wert re: aecopd/ GOLD II  On coreg/ using top part of med calendar but not the prns at bottom  Chief Complaint  Patient presents with  . Acute Visit    prod cough w/green mucus; passing out when coughing; chest congestion; chest tightness; SOB at all times  was doing fine on breo then gradually  worse since changed to symbicort with poor hfa  Cough /breathing worse in am's, has sense of sob even at rest/ harsh upper airway quality coughing  rec zpak Prednisone 10 mg take  4 each  am x 2 days,   2 each am x 2 days,  1 each am x 2 days and stop  Reduce the carevdilol to one-half twice daily  For cough mucinex dm up to 1200 mg every 12 hours and the flutter valve  See Tammy NP w/in 4 weeks> DID NOT DO     10/31/2014 f/u ov/Wert re:  Patricia CanningGold 2 COPD very confused with meds / instructions / no med calendar / supposed to be on symb/spiriva maint rx  Chief Complaint  Patient presents with  . Acute Visit    C/o increase SOB, prod cough (yellow phlem), wheezing, chest tx getting worse x 1 week     was better but resumed smoking then gradually worse x one week, still comfortable at rest p saba  >>zpack and pred taper   12/09/14 Follow up : Follow up : GOLD II COPD and Med Review  Pt returns for 1 month follow up and med review .  We reviewed all her meds and organized them into a med calendar with pt education.  Appears to be taking correctly .  Agrees to flu shot today .  Had COPD flare last ov , tx w/ zpack and steroid  taper.  She is some better but still has lingering cough and congestion clear/white.  She continues to smoke , dicussed cessation . She has cut down to 3 cigs/d No fever, chest pain, orthopnea, or n/v/d.     Current Medications, Allergies, Complete Past Medical History, Past Surgical History, Family History, and Social History were reviewed in [employer] record.    ROS  The following are not active complaints unless bolded sore throat, dysphagia, dental problems, itching, sneezing,  nasal congestion or excess/ purulent secretions, ear ache,   fever, chills, sweats, unintended wt loss, pleuritic or exertional cp, hemoptysis,  orthopnea pnd or leg swelling, presyncope, palpitations, heartburn, abdominal pain, anorexia, nausea, vomiting, diarrhea  or change in bowel or urinary habits, change in stools or urine, dysuria,hematuria,  rash, arthralgias, visual complaints, headache, numbness weakness or ataxia or problems with walking or  coordination,  change in mood/affect or memory.            Objective:  Physical Exam  amb wf nad  In w/c    04/02/2013  198  >   10/02/2013   207 >  201 10/15/13 > 12/30/2013  207 >209 01/27/2014 > 05/02/2014 215>211 05/30/14 > 07/25/2014   214 > 10/31/2014  221 >213 12/09/14      HEENT mild turbinate edema.  Edentulous   / orophynx clear  Nl external ear canals without cough reflex   NECK :  without JVD/Nodes/TM/ nl carotid upstrokes bilaterally   LUNGS: no acc muscle use, diminshed BS in bases , no wheezes   CV:  RRR  no s3 or murmur or increase in P2, no edema   ABD:  soft and nontender with nl excursion in the supine position. No bruits or organomegaly, bowel sounds nl  MS:  warm without deformities, calf tenderness, cyanosis or clubbing  SKIN: warm and dry without lesions    NEURO:  alert, approp, no deficits       CXR PA and Lateral:   07/25/2014 :      1. Persistent mild basilar interstitial prominence, mild pneumonitis cannot be excluded. Underlying chronic interstitial lung disease is most likely present. 2. Heart size stable. No pulmonary venous congestion    Assessment:

## 2014-12-10 NOTE — Progress Notes (Signed)
Chart and office note reviewed in detail  > agree with a/p as outlined    

## 2014-12-10 NOTE — Assessment & Plan Note (Addendum)
Recent flare now resolving  Encouraged on smoking cessation  Patient's medications were reviewed today and patient education was given. Computerized medication calendar was adjusted/completed    Plan  Flu shot today  Follow med calendar closely and bring to each visit.  Continue on current regimen .  follow up Dr. Sherene Sires  In 3 months and As needed

## 2014-12-11 NOTE — Addendum Note (Signed)
Addended by: Karalee Height on: 12/11/2014 05:06 PM   Modules accepted: Orders, Medications

## 2014-12-18 ENCOUNTER — Other Ambulatory Visit: Payer: Self-pay

## 2014-12-18 MED ORDER — CARVEDILOL 3.125 MG PO TABS: ORAL_TABLET | ORAL | Status: DC

## 2015-01-05 NOTE — Progress Notes (Signed)
Patient ID: Patricia Wall, female   DOB: Nov 19, 1950, 64 y.o.   MRN: [id number]   64 y.o.  female patient who has history of CAD status post previous Cypher stent to the LAD, Cypher stent to the distal circumflex and OM 2 branch PTCA in 2005. She had ISR and had balloon angioplasty for restenosis of the circumflex in 2006. Last cath was 6/2011and showed patent stents and no critical stenosis. She also has severe COPD followed by Dr. 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 She has had recurrent syncope and is being followed by a neurologist. She was seen as an OP 12/02/13 with complaints of chest tightness and exertional dyspnea. She was admitted for cath and underwent 2 site CFX AV groove PCI with DES on 12/03/13. She tolerated this well.   Subsequently has had severe back pain She had kyphoplasty in [place] recently but did not stop her plavix for it   Reviewed duplex 05/2014  60-79% LICA  Needs f/u duplex September 2016   Recent bronchitis flair. With CXR ok Rx by pulmonary  In September  Been sick for weeks.  Cough with thick yellow sputum denies fever.  Has not called Pulmonary  In office today tachypnic using accessory muscles.  Tachycardic rates 120's  Sats resting saturation only 87% She uses 2L oxygen at night for the past year    ROS: Denies fever, malais, weight loss, blurry vision, decreased visual acuity, cough, sputum, SOB, hemoptysis, pleuritic pain, palpitaitons, heartburn, abdominal pain, melena, lower extremity edema, claudication, or rash.  All other systems reviewed and negative  General: Affect appropriate Chronically ill female in wheel chair HEENT: normal Neck supple with no adenopathy JVP normal left bruits no thyromegaly Lungs wheezing poor airmovement productive cough  Heart:  S1/S2 no murmur, no rub, gallop or click PMI normal Abdomen: benighn, BS positve, no tenderness, no AAA no bruit.  No HSM or HJR Distal pulses intact with no bruits No edema Neuro  non-focal Skin warm and dry Bilateral LE weakness    Current Outpatient Prescriptions  Medication Sig Dispense Refill  . albuterol (PROAIR HFA) 108 (90 BASE) MCG/ACT inhaler Inhale 2 puffs into the lungs every 4 (four) hours as needed for wheezing or shortness of breath.    [name]. aspirin 81 MG tablet Take 81 mg by mouth at bedtime.     [name]. atorvastatin (LIPITOR) 40 MG tablet Take 40 mg by mouth at bedtime.    . budesonide-formoterol (SYMBICORT) 160-4.5 MCG/ACT inhaler Inhale 2 puffs into the lungs 2 (two) times daily. 10.6 g 5  . calcium carbonate (OS-CAL) 600 MG TABS Take 600 mg by mouth every morning.     . carvedilol (COREG) 3.125 MG tablet TAKE 1/2 TABLET  BY MOUTH 2 (TWO) TIMES DAILY WITH A MEAL. 90 tablet 5  . chlorzoxazone (PARAFON) 500 MG tablet Take 1-1 1/2 tablets every 8 hours as needed for headache    . Cholecalciferol (VITAMIN D) 2000 UNITS tablet Take 2,000 Units by mouth every morning.     . clopidogrel (PLAVIX) 75 MG tablet Take 75 mg by mouth every morning.     [name]. dextromethorphan-guaiFENesin (MUCINEX DM) 30-600 MG per 12 hr tablet Take 1 tablet by mouth 2 (two) times daily.    [name]. Dextromethorphan-Guaifenesin 5-100 MG/5ML LIQD Take 1 tsp every 4 hours as needed for cough and congestion    . Diclofenac Potassium (CAMBIA) 50 MG PACK Take 50 mg by mouth. 4-6 TIMES BY MOUTH PER MONTH    .  DULoxetine (CYMBALTA) 60 MG capsule Take 60 mg by mouth 2 (two) times daily.     [name] gabapentin (NEURONTIN) 300 MG capsule Take 600 mg by mouth 2 (two) times daily.     [name] letrozole (FEMARA) 2.5 MG tablet Take 2.5 mg by mouth every morning.     . metFORMIN (GLUCOPHAGE) 500 MG tablet Take 1,000 mg by mouth 2 (two) times daily.     . Multiple Vitamin (MULTIVITAMIN) tablet Take 1 tablet by mouth every morning.     . niacin (NIASPAN) 1000 MG CR tablet 2 tabs by mouth at bedtime    . nitroGLYCERIN (NITROSTAT) 0.4 MG SL tablet Place 0.4 mg under the tongue every 5 (five) minutes as needed for chest pain (may  repeat x3).     [name] omeprazole (PRILOSEC) 40 MG capsule Take 40 mg by mouth daily before breakfast.     . ondansetron (ZOFRAN) 8 MG tablet Take 8 mg by mouth every 4 (four) hours as needed for nausea.     . orphenadrine (NORFLEX) 100 MG tablet Take 100 mg by mouth every 12 (twelve) hours as needed. FOR MUSCLE SPASMS    . OXYGEN Use 2L of O2 as needed for shortness of breath with activity    . OXYGEN Inhale 2 L into the lungs at bedtime.    [name] Respiratory Therapy Supplies (FLUTTER) DEVI Use as directed 1 each 0  . SPIRIVA HANDIHALER 18 MCG inhalation capsule PLACE 1 CAPSULE (18 MCG TOTAL) INTO INHALER AND INHALE DAILY. (Patient taking differently: Take 2 puffs by mouth every morning) 30 capsule 6  . sucralfate (CARAFATE) 1 G tablet Take 1 tablet (1 g total) by mouth 4 (four) times daily -  with meals and at bedtime. 60 tablet 0   No current facility-administered medications for this visit.    Allergies  Review of patient's allergies indicates no known allergies.  Electrocardiogram:  04/26/14  SR rate 100 low voltage otherwise normal  06/25/14   ST rate 111 low voltage no AFib or MAT  01/07/15  ST rate 127 same P wave morphology as April   Assessment and Plan CAD:  Stable with no angina and good activity level.  Continue medical Rx COPD:  Will send to hospital for admission and CXR will need nebs and likely steroids  Resting sats 87% and tachycardic With use of accessory muscles Has had flu shot needs pneumonia shot DM:  Discussed low carb diet.  Target hemoglobin A1c is 6.5 or less.  Continue current medications. Chol:   Cholesterol is at goal.  Continue current dose of statin and diet Rx.  No myalgias or side effects.  F/U  LFT's in 6 months. Labs with primary  Tachycardia:  Somewhat chronic P waves same as April likely sinus and elevated due to acute on chronic pulmonary disease Would put on telemetry floor during admission  Triage nurse in ER called Daughter will drive her to [facility]

## 2015-01-07 ENCOUNTER — Ambulatory Visit (INDEPENDENT_AMBULATORY_CARE_PROVIDER_SITE_OTHER): Payer: Medicare Other | Admitting: Cardiovascular Disease

## 2015-01-07 ENCOUNTER — Encounter (HOSPITAL_COMMUNITY): Payer: Self-pay | Admitting: Neurology

## 2015-01-07 ENCOUNTER — Encounter: Payer: Self-pay | Admitting: Cardiovascular Disease

## 2015-01-07 ENCOUNTER — Emergency Department (HOSPITAL_COMMUNITY): Payer: Medicare Other

## 2015-01-07 ENCOUNTER — Inpatient Hospital Stay (HOSPITAL_COMMUNITY)
Admission: EM | Admit: 2015-01-07 | Discharge: 2015-01-13 | DRG: 190 | Disposition: A | Discharge status: Home Health | Payer: Medicare Other | Attending: Internal Medicine | Admitting: Internal Medicine

## 2015-01-07 VITALS — BP 100/70 | HR 127 | Ht 66.0 in | Wt 209.0 lb

## 2015-01-07 DIAGNOSIS — Z8673 Personal history of transient ischemic attack (TIA), and cerebral infarction without residual deficits: Secondary | ICD-10-CM

## 2015-01-07 DIAGNOSIS — I2510 Atherosclerotic heart disease of native coronary artery without angina pectoris: Secondary | ICD-10-CM | POA: Diagnosis present

## 2015-01-07 DIAGNOSIS — Z825 Family history of asthma and other chronic lower respiratory diseases: Secondary | ICD-10-CM | POA: Diagnosis not present

## 2015-01-07 DIAGNOSIS — E1165 Type 2 diabetes mellitus with hyperglycemia: Secondary | ICD-10-CM | POA: Diagnosis present

## 2015-01-07 DIAGNOSIS — J9621 Acute and chronic respiratory failure with hypoxia: Secondary | ICD-10-CM | POA: Diagnosis not present

## 2015-01-07 DIAGNOSIS — R000 Tachycardia, unspecified: Secondary | ICD-10-CM | POA: Diagnosis present

## 2015-01-07 DIAGNOSIS — K219 Gastro-esophageal reflux disease without esophagitis: Secondary | ICD-10-CM | POA: Diagnosis present

## 2015-01-07 DIAGNOSIS — I6521 Occlusion and stenosis of right carotid artery: Secondary | ICD-10-CM | POA: Diagnosis present

## 2015-01-07 DIAGNOSIS — K279 Peptic ulcer, site unspecified, unspecified as acute or chronic, without hemorrhage or perforation: Secondary | ICD-10-CM | POA: Diagnosis present

## 2015-01-07 DIAGNOSIS — Z7902 Long term (current) use of antithrombotics/antiplatelets: Secondary | ICD-10-CM

## 2015-01-07 DIAGNOSIS — E875 Hyperkalemia: Secondary | ICD-10-CM | POA: Diagnosis present

## 2015-01-07 DIAGNOSIS — K589 Irritable bowel syndrome without diarrhea: Secondary | ICD-10-CM | POA: Diagnosis present

## 2015-01-07 DIAGNOSIS — T380X5A Adverse effect of glucocorticoids and synthetic analogues, initial encounter: Secondary | ICD-10-CM | POA: Diagnosis not present

## 2015-01-07 DIAGNOSIS — J441 Chronic obstructive pulmonary disease with (acute) exacerbation: Secondary | ICD-10-CM | POA: Diagnosis present

## 2015-01-07 DIAGNOSIS — I10 Essential (primary) hypertension: Secondary | ICD-10-CM | POA: Diagnosis present

## 2015-01-07 DIAGNOSIS — F17210 Nicotine dependence, cigarettes, uncomplicated: Secondary | ICD-10-CM | POA: Diagnosis present

## 2015-01-07 DIAGNOSIS — R0600 Dyspnea, unspecified: Secondary | ICD-10-CM

## 2015-01-07 DIAGNOSIS — E1151 Type 2 diabetes mellitus with diabetic peripheral angiopathy without gangrene: Secondary | ICD-10-CM | POA: Diagnosis present

## 2015-01-07 DIAGNOSIS — Z7984 Long term (current) use of oral hypoglycemic drugs: Secondary | ICD-10-CM | POA: Diagnosis not present

## 2015-01-07 DIAGNOSIS — J9601 Acute respiratory failure with hypoxia: Secondary | ICD-10-CM | POA: Diagnosis not present

## 2015-01-07 DIAGNOSIS — Z7982 Long term (current) use of aspirin: Secondary | ICD-10-CM

## 2015-01-07 DIAGNOSIS — F419 Anxiety disorder, unspecified: Secondary | ICD-10-CM | POA: Diagnosis present

## 2015-01-07 DIAGNOSIS — Z6833 Body mass index (BMI) 33.0-33.9, adult: Secondary | ICD-10-CM | POA: Diagnosis not present

## 2015-01-07 DIAGNOSIS — Z955 Presence of coronary angioplasty implant and graft: Secondary | ICD-10-CM | POA: Diagnosis not present

## 2015-01-07 DIAGNOSIS — Z90710 Acquired absence of both cervix and uterus: Secondary | ICD-10-CM | POA: Diagnosis not present

## 2015-01-07 DIAGNOSIS — Z9861 Coronary angioplasty status: Secondary | ICD-10-CM

## 2015-01-07 DIAGNOSIS — R0689 Other abnormalities of breathing: Secondary | ICD-10-CM | POA: Diagnosis not present

## 2015-01-07 DIAGNOSIS — Z853 Personal history of malignant neoplasm of breast: Secondary | ICD-10-CM | POA: Diagnosis not present

## 2015-01-07 DIAGNOSIS — E872 Acidosis: Secondary | ICD-10-CM | POA: Diagnosis present

## 2015-01-07 DIAGNOSIS — I6523 Occlusion and stenosis of bilateral carotid arteries: Secondary | ICD-10-CM

## 2015-01-07 DIAGNOSIS — Z9981 Dependence on supplemental oxygen: Secondary | ICD-10-CM | POA: Diagnosis not present

## 2015-01-07 DIAGNOSIS — R079 Chest pain, unspecified: Secondary | ICD-10-CM | POA: Diagnosis not present

## 2015-01-07 DIAGNOSIS — D649 Anemia, unspecified: Secondary | ICD-10-CM | POA: Diagnosis present

## 2015-01-07 DIAGNOSIS — Z79899 Other long term (current) drug therapy: Secondary | ICD-10-CM

## 2015-01-07 DIAGNOSIS — I5031 Acute diastolic (congestive) heart failure: Secondary | ICD-10-CM | POA: Diagnosis present

## 2015-01-07 DIAGNOSIS — R0602 Shortness of breath: Secondary | ICD-10-CM | POA: Diagnosis present

## 2015-01-07 DIAGNOSIS — R0789 Other chest pain: Secondary | ICD-10-CM | POA: Diagnosis not present

## 2015-01-07 DIAGNOSIS — Z7951 Long term (current) use of inhaled steroids: Secondary | ICD-10-CM

## 2015-01-07 HISTORY — DX: Acute respiratory failure with hypoxia: J96.01

## 2015-01-07 LAB — CBC WITH DIFFERENTIAL/PLATELET
BASOS ABS: 0 10*3/uL (ref 0.0–0.1)
BASOS PCT: 0 %
EOS ABS: 0.2 10*3/uL (ref 0.0–0.7)
Eosinophils Relative: 2 %
HEMATOCRIT: 37.9 % (ref 36.0–46.0)
HEMOGLOBIN: 11.3 g/dL — AB (ref 12.0–15.0)
Lymphocytes Relative: 25 %
Lymphs Abs: 2.4 10*3/uL (ref 0.7–4.0)
MCH: 25.2 pg — ABNORMAL LOW (ref 26.0–34.0)
MCHC: 29.8 g/dL — AB (ref 30.0–36.0)
MCV: 84.6 fL (ref 78.0–100.0)
Monocytes Absolute: 0.9 10*3/uL (ref 0.1–1.0)
Monocytes Relative: 9 %
NEUTROS ABS: 6.3 10*3/uL (ref 1.7–7.7)
NEUTROS PCT: 64 %
Platelets: 284 10*3/uL (ref 150–400)
RBC: 4.48 MIL/uL (ref 3.87–5.11)
RDW: 17.4 % — ABNORMAL HIGH (ref 11.5–15.5)
WBC: 9.9 10*3/uL (ref 4.0–10.5)

## 2015-01-07 LAB — BASIC METABOLIC PANEL
ANION GAP: 16 — AB (ref 5–15)
BUN: 8 mg/dL (ref 6–20)
CHLORIDE: 100 mmol/L — AB (ref 101–111)
CO2: 25 mmol/L (ref 22–32)
CREATININE: 0.81 mg/dL (ref 0.44–1.00)
Calcium: 9.7 mg/dL (ref 8.9–10.3)
GFR calc non Af Amer: >60 mL/min (ref >60)
Glucose, Bld: 184 mg/dL — ABNORMAL HIGH (ref 65–99)
POTASSIUM: 3.8 mmol/L (ref 3.5–5.1)
SODIUM: 141 mmol/L (ref 135–145)

## 2015-01-07 LAB — I-STAT CG4 LACTIC ACID, ED
LACTIC ACID, VENOUS: 2.5 mmol/L — AB (ref 0.5–2.0)
Lactic Acid, Venous: 1.04 mmol/L (ref 0.5–2.0)

## 2015-01-07 LAB — I-STAT TROPONIN, ED: TROPONIN I, POC: 0 ng/mL (ref 0.00–0.08)

## 2015-01-07 LAB — BRAIN NATRIURETIC PEPTIDE: B NATRIURETIC PEPTIDE 5: 52.1 pg/mL (ref 0.0–100.0)

## 2015-01-07 MED ORDER — CALCIUM CARBONATE 1250 (500 CA) MG PO TABS
1.0000 | ORAL_TABLET | Freq: Every day | ORAL | Status: DC
  Administered 2015-01-08 – 2015-01-10 (×2): 500 mg via ORAL
  Filled 2015-01-07 (×5): qty 1

## 2015-01-07 MED ORDER — ADULT MULTIVITAMIN W/MINERALS CH
1.0000 | ORAL_TABLET | Freq: Every day | ORAL | Status: DC
  Administered 2015-01-08 – 2015-01-13 (×6): 1 via ORAL
  Filled 2015-01-07 (×6): qty 1

## 2015-01-07 MED ORDER — IPRATROPIUM BROMIDE 0.02 % IN SOLN
0.5000 mg | Freq: Four times a day (QID) | RESPIRATORY_TRACT | Status: DC
  Administered 2015-01-08 (×2): 0.5 mg via RESPIRATORY_TRACT
  Filled 2015-01-07 (×2): qty 2.5

## 2015-01-07 MED ORDER — LETROZOLE 2.5 MG PO TABS
2.5000 mg | ORAL_TABLET | ORAL | Status: DC
  Filled 2015-01-07: qty 1

## 2015-01-07 MED ORDER — INSULIN ASPART 100 UNIT/ML ~~LOC~~ SOLN
0.0000 [IU] | Freq: Three times a day (TID) | SUBCUTANEOUS | Status: DC
  Administered 2015-01-08 (×2): 7 [IU] via SUBCUTANEOUS

## 2015-01-07 MED ORDER — SODIUM CHLORIDE 0.9 % IV BOLUS (SEPSIS)
500.0000 mL | Freq: Once | INTRAVENOUS | Status: AC
  Administered 2015-01-07: 500 mL via INTRAVENOUS

## 2015-01-07 MED ORDER — ASPIRIN 81 MG PO TABS: 81.0000 mg | ORAL_TABLET | Freq: Every day | ORAL | Status: DC

## 2015-01-07 MED ORDER — LETROZOLE 2.5 MG PO TABS
2.5000 mg | ORAL_TABLET | Freq: Every day | ORAL | Status: DC
  Administered 2015-01-08 – 2015-01-13 (×6): 2.5 mg via ORAL
  Filled 2015-01-07 (×6): qty 1

## 2015-01-07 MED ORDER — CARVEDILOL 3.125 MG PO TABS
3.1250 mg | ORAL_TABLET | Freq: Two times a day (BID) | ORAL | Status: DC
  Administered 2015-01-08: 3.125 mg via ORAL
  Filled 2015-01-07: qty 1

## 2015-01-07 MED ORDER — LEVALBUTEROL HCL 1.25 MG/0.5ML IN NEBU
1.2500 mg | INHALATION_SOLUTION | Freq: Four times a day (QID) | RESPIRATORY_TRACT | Status: DC
  Administered 2015-01-08 (×2): 1.25 mg via RESPIRATORY_TRACT
  Filled 2015-01-07 (×2): qty 0.5

## 2015-01-07 MED ORDER — METHYLPREDNISOLONE SODIUM SUCC 125 MG IJ SOLR
125.0000 mg | Freq: Once | INTRAMUSCULAR | Status: AC
  Administered 2015-01-07: 125 mg via INTRAVENOUS
  Filled 2015-01-07: qty 2

## 2015-01-07 MED ORDER — MAGNESIUM SULFATE 2 GM/50ML IV SOLN
2.0000 g | Freq: Once | INTRAVENOUS | Status: AC
  Administered 2015-01-07: 2 g via INTRAVENOUS
  Filled 2015-01-07: qty 50

## 2015-01-07 MED ORDER — ACETAMINOPHEN 325 MG PO TABS
650.0000 mg | ORAL_TABLET | Freq: Once | ORAL | Status: AC
  Administered 2015-01-07: 650 mg via ORAL
  Filled 2015-01-07: qty 2

## 2015-01-07 MED ORDER — ATORVASTATIN CALCIUM 40 MG PO TABS
40.0000 mg | ORAL_TABLET | Freq: Every day | ORAL | Status: DC
  Administered 2015-01-07 – 2015-01-12 (×6): 40 mg via ORAL
  Filled 2015-01-07 (×7): qty 1

## 2015-01-07 MED ORDER — ORPHENADRINE CITRATE ER 100 MG PO TB12
100.0000 mg | ORAL_TABLET | Freq: Two times a day (BID) | ORAL | Status: DC | PRN
  Filled 2015-01-07: qty 1

## 2015-01-07 MED ORDER — LEVOFLOXACIN IN D5W 750 MG/150ML IV SOLN
750.0000 mg | Freq: Once | INTRAVENOUS | Status: AC
  Administered 2015-01-07: 750 mg via INTRAVENOUS
  Filled 2015-01-07: qty 150

## 2015-01-07 MED ORDER — NIACIN ER 500 MG PO CPCR
2000.0000 mg | ORAL_CAPSULE | Freq: Every day | ORAL | Status: DC
  Administered 2015-01-07 – 2015-01-12 (×6): 2000 mg via ORAL
  Filled 2015-01-07 (×8): qty 4

## 2015-01-07 MED ORDER — IPRATROPIUM BROMIDE 0.02 % IN SOLN: 0.5000 mg | Freq: Four times a day (QID) | RESPIRATORY_TRACT | Status: DC

## 2015-01-07 MED ORDER — LEVALBUTEROL HCL 1.25 MG/0.5ML IN NEBU: 1.2500 mg | INHALATION_SOLUTION | Freq: Four times a day (QID) | RESPIRATORY_TRACT | Status: DC

## 2015-01-07 MED ORDER — LEVOFLOXACIN IN D5W 750 MG/150ML IV SOLN
750.0000 mg | INTRAVENOUS | Status: AC
  Administered 2015-01-08 – 2015-01-11 (×4): 750 mg via INTRAVENOUS
  Filled 2015-01-07 (×4): qty 150

## 2015-01-07 MED ORDER — ONE-DAILY MULTI VITAMINS PO TABS: 1.0000 | ORAL_TABLET | ORAL | Status: DC

## 2015-01-07 MED ORDER — CLOPIDOGREL BISULFATE 75 MG PO TABS
75.0000 mg | ORAL_TABLET | Freq: Every day | ORAL | Status: DC
  Administered 2015-01-08 – 2015-01-13 (×6): 75 mg via ORAL
  Filled 2015-01-07 (×6): qty 1

## 2015-01-07 MED ORDER — ACETAMINOPHEN 325 MG PO TABS: 650.0000 mg | ORAL_TABLET | Freq: Four times a day (QID) | ORAL | Status: DC | PRN

## 2015-01-07 MED ORDER — NIACIN ER (ANTIHYPERLIPIDEMIC) 1000 MG PO TBCR: 2000.0000 mg | EXTENDED_RELEASE_TABLET | Freq: Every day | ORAL | Status: DC

## 2015-01-07 MED ORDER — ALBUTEROL (5 MG/ML) CONTINUOUS INHALATION SOLN
15.0000 mg/h | INHALATION_SOLUTION | Freq: Once | RESPIRATORY_TRACT | Status: AC
  Administered 2015-01-07: 15 mg/h via RESPIRATORY_TRACT
  Filled 2015-01-07: qty 20

## 2015-01-07 MED ORDER — SODIUM CHLORIDE 0.9 % IV BOLUS (SEPSIS)
1000.0000 mL | Freq: Once | INTRAVENOUS | Status: AC
  Administered 2015-01-07: 1000 mL via INTRAVENOUS

## 2015-01-07 MED ORDER — ENOXAPARIN SODIUM 40 MG/0.4ML ~~LOC~~ SOLN
40.0000 mg | Freq: Every day | SUBCUTANEOUS | Status: DC
  Administered 2015-01-07 – 2015-01-08 (×2): 40 mg via SUBCUTANEOUS
  Filled 2015-01-07 (×3): qty 0.4

## 2015-01-07 MED ORDER — CALCIUM CARBONATE 600 MG PO TABS: 600.0000 mg | ORAL_TABLET | ORAL | Status: DC

## 2015-01-07 MED ORDER — GABAPENTIN 600 MG PO TABS
600.0000 mg | ORAL_TABLET | Freq: Two times a day (BID) | ORAL | Status: DC
  Administered 2015-01-07 – 2015-01-08 (×2): 600 mg via ORAL
  Filled 2015-01-07 (×3): qty 1

## 2015-01-07 MED ORDER — NITROGLYCERIN 0.4 MG SL SUBL: 0.4000 mg | SUBLINGUAL_TABLET | SUBLINGUAL | Status: DC | PRN

## 2015-01-07 MED ORDER — PANTOPRAZOLE SODIUM 40 MG PO TBEC
40.0000 mg | DELAYED_RELEASE_TABLET | Freq: Every day | ORAL | Status: DC
  Administered 2015-01-08: 40 mg via ORAL
  Filled 2015-01-07: qty 1

## 2015-01-07 MED ORDER — DULOXETINE HCL 60 MG PO CPEP
60.0000 mg | ORAL_CAPSULE | Freq: Two times a day (BID) | ORAL | Status: DC
  Administered 2015-01-07 – 2015-01-13 (×12): 60 mg via ORAL
  Filled 2015-01-07 (×13): qty 1

## 2015-01-07 MED ORDER — GABAPENTIN 300 MG PO CAPS: 600.0000 mg | ORAL_CAPSULE | Freq: Two times a day (BID) | ORAL | Status: DC

## 2015-01-07 MED ORDER — ASPIRIN 81 MG PO CHEW
81.0000 mg | CHEWABLE_TABLET | Freq: Every day | ORAL | Status: DC
  Administered 2015-01-07 – 2015-01-10 (×4): 81 mg via ORAL
  Filled 2015-01-07 (×4): qty 1

## 2015-01-07 MED ORDER — POTASSIUM CHLORIDE IN NACL 20-0.9 MEQ/L-% IV SOLN
INTRAVENOUS | Status: AC
  Administered 2015-01-08: via INTRAVENOUS
  Filled 2015-01-07: qty 1000

## 2015-01-07 MED ORDER — ONDANSETRON HCL 4 MG/2ML IJ SOLN: 4.0000 mg | Freq: Four times a day (QID) | INTRAMUSCULAR | Status: DC | PRN

## 2015-01-07 MED ORDER — IPRATROPIUM BROMIDE 0.02 % IN SOLN
1.0000 mg | Freq: Once | RESPIRATORY_TRACT | Status: AC
  Administered 2015-01-07: 1 mg via RESPIRATORY_TRACT
  Filled 2015-01-07: qty 5

## 2015-01-07 MED ORDER — METHYLPREDNISOLONE SODIUM SUCC 125 MG IJ SOLR: 80.0000 mg | Freq: Four times a day (QID) | INTRAMUSCULAR | Status: DC

## 2015-01-07 MED ORDER — METFORMIN HCL 500 MG PO TABS
1000.0000 mg | ORAL_TABLET | Freq: Two times a day (BID) | ORAL | Status: DC
  Administered 2015-01-08: 1000 mg via ORAL
  Filled 2015-01-07: qty 2

## 2015-01-07 NOTE — ED Provider Notes (Signed)
CSN: [id number]     Arrival date & time 01/07/15  1531 History   First MD Initiated Contact with Patient 01/07/15 1545     Chief Complaint  Patient presents with  . Shortness of Breath     (Consider location/radiation/quality/duration/timing/severity/associated sxs/prior Treatment) HPI  64 year old female with a history of COPD presents with progressive dyspnea for the last several months. Has had a cough with yellow sputum during this time. No fevers. Symptoms have been personally worsening and when she saw her cardiologist today for a regular visit she was noted to be hypoxic into the high 80s with increased work of breathing and was sent to the ER. Has been using her albuterol inhaler at least 3 times a day with no significant help. Wears oxygen at night but not during the day. Has noticed some swelling to her bilateral ankles over the past couple weeks, improved today. No unilateral swelling or pain. Has chest tightness/pressure associated with the shortness of breath. Has been having some nasal congestion and rhinorrhea.  Past Medical History  Diagnosis Date  . Chest pain   . HTN (hypertension)   . TIA (transient ischemic attack)   . CAD (coronary artery disease)      [facility].  Stent to OM and LAD Last cath 6/11 cathed on June 13, [name]. 2011.  The cardiac catheterization showed 30% and 50% lesions in the mid  and distal LAD, 50% in the OM1, 40% in the OM2 with 20% in-stent  restenosis, 40% in-stent restenosis in the circumflex and no significant  disease in the RCA.  Her EF was normal.  Dr. Juanda ChanceBrodie recommended a trial  of proton pump inhibitors   . PVD (peripheral vascular disease) (HCC)   . Abnormal chest xray   . Vertigo   . Lymphadenitis   . Pharyngitis   . Sinusitis   . Insomnia   . Cyanosis   . Tobacco abuse   . IBS (irritable bowel syndrome)   . Diarrhea   . Hemorrhoids   . DDD (degenerative disc disease)   . Alcohol abuse   . PUD (peptic ulcer disease)   .  Back pain   . GERD (gastroesophageal reflux disease)   . Diverticular disease   . Depression   . COPD (chronic obstructive pulmonary disease) (HCC)   . H/O: hysterectomy    Past Surgical History  Procedure Laterality Date  . Appendectomy    . Tubal ligation    . Cardiac catheterization  2005, 2006, 2009,2011  . Coronary stent placement      Status post balloon angiogram plasty and Cypher stent to the distal    circumflex and OM2 branch  . Left bunionectomy    . Hammertoe repair    . Left heart catheterization with coronary angiogram N/A 12/03/2013    Procedure: LEFT HEART CATHETERIZATION WITH CORONARY ANGIOGRAM;  Surgeon: Marykay Lexavid W Harding, MD;  Location: [facility] C[facility] LAB;  Service: Cardiovascular;  Laterality: N/A;   Family History  Problem Relation Age of Onset  . Coronary artery disease    . Emphysema Father   . Asthma Sister     2 sisters  . Heart disease Mother   . Heart disease Father   . Rheumatologic disease Mother   . Cancer - Other Mother     Uterine  . Cancer - Other Father   . Stroke Sister    Social History  Substance Use Topics  . Smoking status: Current Some Day Smoker -- 2.00 packs/day  for 52 years    Types: Cigarettes    Start date: 01/16/1961    Last Attempt to Quit: 10/30/2014  . Smokeless tobacco: None     Comment: pt reports she stopped smoking yesterday  . Alcohol Use: No   OB History    No data available     Review of Systems  Constitutional: Negative for fever.  Respiratory: Positive for cough, chest tightness and shortness of breath.   Cardiovascular: Positive for chest pain and leg swelling.  Gastrointestinal: Negative for abdominal pain.  All other systems reviewed and are negative.     Allergies  Review of patient's allergies indicates no known allergies.  Home Medications   Prior to Admission medications   Medication Sig Start Date End Date Taking? Authorizing Provider  albuterol (PROAIR HFA) 108 (90 BASE) MCG/ACT inhaler Inhale 2  puffs into the lungs every 4 (four) hours as needed for wheezing or shortness of breath.    Historical Provider, MD  aspirin 81 MG tablet Take 81 mg by mouth at bedtime.     Historical Provider, MD  atorvastatin (LIPITOR) 40 MG tablet Take 40 mg by mouth at bedtime.    Historical Provider, MD  budesonide-formoterol (SYMBICORT) 160-4.5 MCG/ACT inhaler Inhale 2 puffs into the lungs 2 (two) times daily. 05/30/14   Tammy S Parrett, NP  calcium carbonate (OS-CAL) 600 MG TABS Take 600 mg by mouth every morning.     Historical Provider, MD  carvedilol (COREG) 3.125 MG tablet TAKE 1/2 TABLET  BY MOUTH 2 (TWO) TIMES DAILY WITH A MEAL. 12/18/14   Nyoka Cowden, MD  chlorzoxazone (PARAFON) 500 MG tablet Take 1-1 1/2 tablets every 8 hours as needed for headache    Historical Provider, MD  Cholecalciferol (VITAMIN D) 2000 UNITS tablet Take 2,000 Units by mouth every morning.     Historical Provider, MD  clopidogrel (PLAVIX) 75 MG tablet Take 75 mg by mouth every morning.     Historical Provider, MD  dextromethorphan-guaiFENesin (MUCINEX DM) 30-600 MG per 12 hr tablet Take 1 tablet by mouth 2 (two) times daily.    Historical Provider, MD  Dextromethorphan-Guaifenesin 5-100 MG/5ML LIQD Take 1 tsp every 4 hours as needed for cough and congestion    Historical Provider, MD  Diclofenac Potassium (CAMBIA) 50 MG PACK Take 50 mg by mouth. 4-6 TIMES BY MOUTH PER MONTH 12/18/14 12/18/15  Historical Provider, MD  DULoxetine (CYMBALTA) 60 MG capsule Take 60 mg by mouth 2 (two) times daily.     Historical Provider, MD  gabapentin (NEURONTIN) 300 MG capsule Take 600 mg by mouth 2 (two) times daily.     Historical Provider, MD  letrozole (FEMARA) 2.5 MG tablet Take 2.5 mg by mouth every morning.     Historical Provider, MD  metFORMIN (GLUCOPHAGE) 500 MG tablet Take 1,000 mg by mouth 2 (two) times daily.  04/03/14 04/03/15  Historical Provider, MD  Multiple Vitamin (MULTIVITAMIN) tablet Take 1 tablet by mouth every morning.      Historical Provider, MD  niacin (NIASPAN) 1000 MG CR tablet 2 tabs by mouth at bedtime    Historical Provider, MD  nitroGLYCERIN (NITROSTAT) 0.4 MG SL tablet Place 0.4 mg under the tongue every 5 (five) minutes as needed for chest pain (may repeat x3).     Historical Provider, MD  omeprazole (PRILOSEC) 40 MG capsule Take 40 mg by mouth daily before breakfast.     Historical Provider, MD  ondansetron (ZOFRAN) 8 MG tablet Take 8 mg by  mouth every 4 (four) hours as needed for nausea.     Historical Provider, MD  orphenadrine (NORFLEX) 100 MG tablet Take 100 mg by mouth every 12 (twelve) hours as needed. FOR MUSCLE SPASMS 12/24/14 12/24/15  Historical Provider, MD  OXYGEN Use 2L of O2 as needed for shortness of breath with activity    Historical Provider, MD  OXYGEN Inhale 2 L into the lungs at bedtime.    Historical Provider, MD  Respiratory Therapy Supplies (FLUTTER) DEVI Use as directed 07/25/14   Nyoka Cowden, MD  SPIRIVA HANDIHALER 18 MCG inhalation capsule PLACE 1 CAPSULE (18 MCG TOTAL) INTO INHALER AND INHALE DAILY. Patient taking differently: Take 2 puffs by mouth every morning 06/09/14   Tammy S Parrett, NP  sucralfate (CARAFATE) 1 G tablet Take 1 tablet (1 g total) by mouth 4 (four) times daily -  with meals and at bedtime. 04/25/14   Gwyneth Sprout, MD   BP 98/68 mmHg  Pulse 132  Temp(Src) 99 F (37.2 C) (Oral)  Resp 24  SpO2 91% Physical Exam  Constitutional: She is oriented to person, place, and time. She appears well-developed and well-nourished.  HENT:  Head: Normocephalic and atraumatic.  Right Ear: External ear normal.  Left Ear: External ear normal.  Nose: Nose normal.  Eyes: Right eye exhibits no discharge. Left eye exhibits no discharge.  Cardiovascular: Normal rate, regular rhythm and normal heart sounds.   Pulmonary/Chest: Accessory muscle usage present. Tachypnea noted. She has decreased breath sounds. She has wheezes.  Diffuse decreased BS and mild expiratory wheezes   Abdominal: Soft. There is no tenderness.  Musculoskeletal: She exhibits edema (trace nonpitting pedal edema bilaterally).  Neurological: She is alert and oriented to person, place, and time.  Skin: Skin is warm and dry.  Nursing note and vitals reviewed.   ED Course  Procedures (including critical care time) Labs Review Labs Reviewed  CBC WITH DIFFERENTIAL/PLATELET - Abnormal; Notable for the following:    Hemoglobin 11.3 (*)    MCH 25.2 (*)    MCHC 29.8 (*)    RDW 17.4 (*)    All other components within normal limits  BASIC METABOLIC PANEL - Abnormal; Notable for the following:    Chloride 100 (*)    Glucose, Bld 184 (*)    Anion gap 16 (*)    All other components within normal limits  I-STAT CG4 LACTIC ACID, ED - Abnormal; Notable for the following:    Lactic Acid, Venous 2.50 (*)    All other components within normal limits  CULTURE, BLOOD (ROUTINE X 2)  CULTURE, BLOOD (ROUTINE X 2)  MRSA PCR SCREENING  BRAIN NATRIURETIC PEPTIDE  CBC  CREATININE, SERUM  PHOSPHORUS  TROPONIN I  TROPONIN I  FERRITIN  IRON AND TIBC  VITAMIN B12  FOLATE  HEMOGLOBIN A1C  TSH  I-STAT TROPOININ, ED  I-STAT CG4 LACTIC ACID, ED    Imaging Review Dg Chest Port 1 View  01/07/2015  CLINICAL DATA:  Productive cough for 3 weeks.  Hypoxia. EXAM: PORTABLE CHEST 1 VIEW COMPARISON:  Jul 25, 2014. FINDINGS: The heart size and mediastinal contours are within normal limits. No pneumothorax or pleural effusion is noted. Stable bibasilar interstitial densities are noted most consistent with scarring. No acute pulmonary disease is noted. The visualized skeletal structures are unremarkable. IMPRESSION: No acute cardiopulmonary abnormality seen. Electronically Signed   By: Lupita Raider, M.D.   On: 01/07/2015 16:32   I have personally reviewed and evaluated these images and  lab results as part of my medical decision-making.   EKG Interpretation   Date/Time:  Wednesday January 07 2015 15:48:54  EDT Ventricular Rate:  129 PR Interval:  130 QRS Duration: 72 QT Interval:  300 QTC Calculation: 439 R Axis:   93 Text Interpretation:  Sinus tachycardia Rightward axis Septal infarct ,  age undetermined Abnormal ECG Interpretation limited secondary to artifact  no significant change since Feb 2016 Confirmed by Criss Alvine  MD, Aubriee Szeto  ([id number]) on 01/07/2015 4:06:36 PM      CRITICAL CARE Performed by: Pricilla Loveless T   Total critical care time: 35 minutes  Critical care time was exclusive of separately billable procedures and treating other patients.  Critical care was necessary to treat or prevent imminent or life-threatening deterioration.  Critical care was time spent personally by me on the following activities: development of treatment plan with patient and/or surrogate as well as nursing, discussions with consultants, evaluation of patient's response to treatment, examination of patient, obtaining history from patient or surrogate, ordering and performing treatments and interventions, ordering and review of laboratory studies, ordering and review of radiographic studies, pulse oximetry and re-evaluation of patient's condition.  MDM   Final diagnoses:  COPD exacerbation (HCC)    Patient appears ill on arrival although after an hour-long breathing treatment, steroids, and magnesium she is much better. She is still to get neck and tachycardic has improved significantly. Her breath sounds are opened up significantly. Still has wheezing but appears much more comfortable. No focal pneumonia but this is most consistent with a COPD exacerbation and she will be given IV antibiotics given her fever and overall poor appearance. Her chest pain seems most likely due to her COPD, no signs of ACS. I doubt pulmonary embolism with infectious symptoms and diffuse wheezing. While she is better she will still need close monitoring and will be admitted to stepdown.    Pricilla Loveless, MD 01/07/15  [phone]

## 2015-01-07 NOTE — H&P (Signed)
[facility] History and Physical  Patricia RedheadJoan B Vanbergen BJY:[id number] DOB: [birth year] DOA: 01/07/2015  Referring physician: Artelia Larocheole Gholston, M.D. PCP: Willow OraANDY,CAMILLE L, MD   Chief Complaint: Shortness of breath.  HPI: Patricia Wall is a 64 y.o. female  with a past medical history of COPD on nocturnal home oxygen, tobacco abuse, depression, GERD, PUD, hypertension, hyperlipidemia, CAD, TIA who was referred to the emergency department from her cardiologist office due to hypoxia in the 80s, tachypnea and respiratory acidosis or muscle used. The patient states that she has been having worsening dyspnea for the past few months associated with yellowish sputum productive cough, which she states has been significantly worse over the last 2 or 3 days. This is not relieved significantly by her albuterol inhaler.   In the ER, the patient received oxygen, methylprednisolone IV, magnesium sulfate and continuous nebulized bronchodilators sustaining significant relief. However, the nebulized bronchodilators produced tachycardia in the 130s and the patient developed transient nonirradiated chest pressure, associated with palpitations which improved once her heart rate went down to the 110s. She denies dizziness, diaphoresis, nausea or emesis. She is currently in no acute distress.   Review of Systems:  Constitutional:  Positive fatigue. No weight loss, night sweats, Fevers, chills,   HEENT:  No headaches, Difficulty swallowing,Tooth/dental problems,Sore throat,  No sneezing, itching, ear ache, nasal congestion, post nasal drip,  Cardio-vascular:  Positive chest pain, swelling in lower extremities, Orthopnea, positive post albuterol treatment palpitations. Denies PND, anasarca, dizziness,  GI:  No heartburn, indigestion, abdominal pain, nausea, vomiting, diarrhea, change in bowel habits, loss of appetite  Resp:  Positive shortness of breath, productive cough, wheezing. No hemoptysis Skin:  no rash or  lesions.  GU:  no dysuria, change in color of urine, no urgency or frequency. No flank pain.  Musculoskeletal:  Occasional arthralgias.No decreased range of motion. No back pain.  Psych:  No change in mood or affect. No depression or anxiety. No memory loss.   Past Medical History  Diagnosis Date  . Chest pain   . HTN (hypertension)   . TIA (transient ischemic attack)   . CAD (coronary artery disease)      [facility]  Stent to OM and LAD Last cath 6/11 cathed on June 13, [name]. 2011.  The cardiac catheterization showed 30% and 50% lesions in the mid  and distal LAD, 50% in the OM1, 40% in the OM2 with 20% in-stent  restenosis, 40% in-stent restenosis in the circumflex and no significant  disease in the RCA.  Her EF was normal.  Dr. Juanda ChanceBrodie recommended a trial  of proton pump inhibitors   . PVD (peripheral vascular disease) (HCC)   . Abnormal chest xray   . Vertigo   . Lymphadenitis   . Pharyngitis   . Sinusitis   . Insomnia   . Cyanosis   . Tobacco abuse   . IBS (irritable bowel syndrome)   . Diarrhea   . Hemorrhoids   . DDD (degenerative disc disease)   . Alcohol abuse   . PUD (peptic ulcer disease)   . Back pain   . GERD (gastroesophageal reflux disease)   . Diverticular disease   . Depression   . COPD (chronic obstructive pulmonary disease) (HCC)   . H/O: hysterectomy    Past Surgical History  Procedure Laterality Date  . Appendectomy    . Tubal ligation    . Cardiac catheterization  2005, 2006, 2009,2011  . Coronary stent placement  Status post balloon angiogram plasty and Cypher stent to the distal    circumflex and OM2 branch  . Left bunionectomy    . Hammertoe repair    . Left heart catheterization with coronary angiogram N/A 12/03/2013    Procedure: LEFT HEART CATHETERIZATION WITH CORONARY ANGIOGRAM;  Surgeon: Marykay Lex, MD;  Location: [facility] CATH LAB;  Service: Cardiovascular;  Laterality: N/A;  . Abdominal hysterectomy    . Cholecystectomy    .  Breast lumpectomy Left 2015   Social History:  reports that she has been smoking Cigarettes.  She started smoking about 54 years ago. She has a 104 pack-year smoking history. She does not have any smokeless tobacco history on file. She reports that she uses illicit drugs. She reports that she does not drink alcohol.  No Known Allergies  Family History  Problem Relation Age of Onset  . Coronary artery disease    . Emphysema Father   . Asthma Sister     2 sisters  . Heart disease Mother   . Heart disease Father   . Rheumatologic disease Mother   . Cancer - Other Mother     Uterine  . Cancer - Other Father   . Stroke Sister     Prior to Admission medications   Medication Sig Start Date End Date Taking? Authorizing Provider  albuterol (PROAIR HFA) 108 (90 BASE) MCG/ACT inhaler Inhale 2 puffs into the lungs every 4 (four) hours as needed for wheezing or shortness of breath.   Yes Historical Provider, MD  aspirin 81 MG tablet Take 81 mg by mouth at bedtime.    Yes Historical Provider, MD  atorvastatin (LIPITOR) 40 MG tablet Take 40 mg by mouth at bedtime.   Yes Historical Provider, MD  budesonide-formoterol (SYMBICORT) 160-4.5 MCG/ACT inhaler Inhale 2 puffs into the lungs 2 (two) times daily. 05/30/14  Yes Tammy S Parrett, NP  calcium carbonate (OS-CAL) 600 MG TABS Take 600 mg by mouth every morning.    Yes Historical Provider, MD  carvedilol (COREG) 3.125 MG tablet TAKE 1/2 TABLET  BY MOUTH 2 (TWO) TIMES DAILY WITH A MEAL. 12/18/14  Yes Nyoka Cowden, MD  chlorzoxazone (PARAFON) 500 MG tablet Take 500 mg by mouth 3 (three) times daily as needed (headache). Take 1-1 1/2 tablets every 8 hours as needed for headache   Yes Historical Provider, MD  Cholecalciferol (VITAMIN D) 2000 UNITS tablet Take 2,000 Units by mouth every morning.    Yes Historical Provider, MD  clopidogrel (PLAVIX) 75 MG tablet Take 75 mg by mouth every morning.    Yes Historical Provider, MD  dextromethorphan-guaiFENesin  (MUCINEX DM) 30-600 MG per 12 hr tablet Take 1 tablet by mouth 2 (two) times daily.   Yes Historical Provider, MD  Diclofenac Potassium (CAMBIA) 50 MG PACK Take 50 mg by mouth daily as needed (headache). 4-6 TIMES BY MOUTH PER MONTH 12/18/14 12/18/15 Yes Historical Provider, MD  DULoxetine (CYMBALTA) 60 MG capsule Take 60 mg by mouth 2 (two) times daily.    Yes Historical Provider, MD  gabapentin (NEURONTIN) 300 MG capsule Take 600 mg by mouth 2 (two) times daily.    Yes Historical Provider, MD  letrozole (FEMARA) 2.5 MG tablet Take 2.5 mg by mouth every morning.    Yes Historical Provider, MD  metFORMIN (GLUCOPHAGE) 500 MG tablet Take 1,000 mg by mouth 2 (two) times daily.  04/03/14 04/03/15 Yes Historical Provider, MD  Multiple Vitamin (MULTIVITAMIN) tablet Take 1 tablet by mouth every morning.  Yes Historical Provider, MD  niacin (NIASPAN) 1000 MG CR tablet Take 2,000 mg by mouth at bedtime. 2 tabs by mouth at bedtime   Yes Historical Provider, MD  nitroGLYCERIN (NITROSTAT) 0.4 MG SL tablet Place 0.4 mg under the tongue every 5 (five) minutes as needed for chest pain (may repeat x3).    Yes Historical Provider, MD  omeprazole (PRILOSEC) 40 MG capsule Take 40 mg by mouth daily before breakfast.    Yes Historical Provider, MD  ondansetron (ZOFRAN) 8 MG tablet Take 8 mg by mouth every 4 (four) hours as needed for nausea.    Yes Historical Provider, MD  orphenadrine (NORFLEX) 100 MG tablet Take 100 mg by mouth every 12 (twelve) hours as needed for muscle spasms. FOR MUSCLE SPASMS 12/24/14 12/24/15 Yes Historical Provider, MD  OXYGEN Inhale 2 L into the lungs at bedtime.   Yes Historical Provider, MD  Respiratory Therapy Supplies (FLUTTER) DEVI Use as directed 07/25/14  Yes Nyoka Cowden, MD  SPIRIVA HANDIHALER 18 MCG inhalation capsule PLACE 1 CAPSULE (18 MCG TOTAL) INTO INHALER AND INHALE DAILY. Patient taking differently: Take 2 puffs by mouth every morning 06/09/14  Yes Tammy S Parrett, NP  sucralfate  (CARAFATE) 1 G tablet Take 1 tablet (1 g total) by mouth 4 (four) times daily -  with meals and at bedtime. Patient not taking: Reported on 01/07/2015 04/25/14   Gwyneth Sprout, MD   Physical Exam: Filed Vitals:   01/07/15 1916 01/07/15 2000 01/07/15 2045 01/07/15 2100  BP:  124/58 153/74 149/71  Pulse:  113 117 114  Temp: 98.3 F (36.8 C)     TempSrc: Oral     Resp:   20   SpO2:  91% 92% 93%    Wt Readings from Last 3 Encounters:  01/07/15 94.802 kg (209 lb)  12/09/14 96.616 kg (213 lb)  10/31/14 100.064 kg (220 lb 9.6 oz)    General:  Appears calm and comfortable Eyes: PERRL, normal lids, irises & conjunctiva ENT: grossly normal hearing, lips & tongue are dry Neck: no LAD, masses or thyromegaly Cardiovascular: Tachycardic. 1+ LE edema. Telemetry: Sinus tachycardia. Respiratory: Tachypneic. Decreased breath sounds bilaterally, positive wheezing and rhonchi bilaterally. Abdomen: soft, ntnd Skin: no rash or induration seen on limited exam Musculoskeletal: grossly normal tone BUE/BLE Psychiatric: grossly normal mood and affect, speech fluent and appropriate Neurologic: grossly non-focal.          Labs on Admission:  Basic Metabolic Panel:  Recent Labs Lab 01/07/15 1539  NA 141  K 3.8  CL 100*  CO2 25  GLUCOSE 184*  BUN 8  CREATININE 0.81  CALCIUM 9.7   CBC:  Recent Labs Lab 01/07/15 1539  WBC 9.9  NEUTROABS 6.3  HGB 11.3*  HCT 37.9  MCV 84.6  PLT 284    BNP (last 3 results)  Recent Labs  01/07/15 1605  BNP 52.1    Radiological Exams on Admission: Dg Chest Port 1 View  01/07/2015  CLINICAL DATA:  Productive cough for 3 weeks.  Hypoxia. EXAM: PORTABLE CHEST 1 VIEW COMPARISON:  Jul 25, 2014. FINDINGS: The heart size and mediastinal contours are within normal limits. No pneumothorax or pleural effusion is noted. Stable bibasilar interstitial densities are noted most consistent with scarring. No acute pulmonary disease is noted. The visualized  skeletal structures are unremarkable. IMPRESSION: No acute cardiopulmonary abnormality seen. Electronically Signed   By: Lupita Raider, M.D.   On: 01/07/2015 16:32    EKG: Independently reviewed. Vent.  rate 129 BPM PR interval 130 ms QRS duration 72 ms QT/QTc 300/439 ms P-R-T axes 77 93 108 Sinus tachycardia Rightward axis Septal infarct , age undetermined Abnormal ECG Interpretation limited secondary to artifact   Assessment/Plan Principal Problem:     Acute respiratory insufficiency     COPD exacerbation (HCC) Admit to a stepdown for closer monitoring. Continue supplemental oxygen. Continue bronchodilators. Continue IV Levaquin.  Active Problems:   Chest pain   History of CAD (coronary artery disease) She is chest pain-free now. Telemetry monitoring. Serial troponin levels trending. Continue beta blocker and antiplatelet agents.     Essential hypertension Continue carvedilol and monitor blood pressure.    GERD Continue omeprazole.    Irritable bowel syndrome Occasional diarrhea, but as symptomatic at this time.      Code Status: Full code. DVT Prophylaxis: Lovenox SQ. Family Communication:  Disposition Plan: Admit to a stepdown for closer monitoring. Continue IV antibiotics for at least 2 or 3 days.  Time spent: Over 70 minutes were spent during the process of this admission.  Bobette Mo [facility] Pager [phone].

## 2015-01-07 NOTE — ED Notes (Signed)
Pt remains monitored by blood pressure, pulse ox, and 12 lead.  

## 2015-01-07 NOTE — ED Notes (Signed)
Dr.Goldston made aware of pt's chest pressure

## 2015-01-07 NOTE — ED Notes (Signed)
Sandwich given and drink given by admitting MD; Pt placement made aware of upgrading bed to a stepdown

## 2015-01-07 NOTE — ED Notes (Signed)
Dr Ortiz at bedside 

## 2015-01-07 NOTE — ED Notes (Signed)
Pt from home for eval of increased sob at rest and with exertion, states some chest tightness as well and increased swelling to bilateral ankles. Denies any hx of chf, reports hx of copd and only normally wears oxygen at bedtime. Pt 88-89% on room air at this time, placed on 1.5L nasal cannula and increased to 92%. Pt also reports yellow sputum and productive cough, pt diaphoretic and with labored breathing. MD at bedside. Pt alert and orietned.

## 2015-01-07 NOTE — ED Notes (Signed)
Pt reports feeling sob with cough for several months, went to MD today for regular appointment found low oxygen and elevated HR. Reports she started smoking again and since then has had these s/s. Pt appears SOB, 91% RA, HR 130. Pt denies CP. Has been prescribed oxygen for use at night also several inhalers for cough with no relief.

## 2015-01-07 NOTE — Progress Notes (Signed)
Attempted to receive report. 

## 2015-01-07 NOTE — ED Notes (Signed)
Attempted report 

## 2015-01-08 ENCOUNTER — Inpatient Hospital Stay (HOSPITAL_COMMUNITY): Payer: Medicare Other

## 2015-01-08 DIAGNOSIS — I2510 Atherosclerotic heart disease of native coronary artery without angina pectoris: Secondary | ICD-10-CM

## 2015-01-08 DIAGNOSIS — R0789 Other chest pain: Secondary | ICD-10-CM

## 2015-01-08 DIAGNOSIS — J441 Chronic obstructive pulmonary disease with (acute) exacerbation: Principal | ICD-10-CM

## 2015-01-08 DIAGNOSIS — J9621 Acute and chronic respiratory failure with hypoxia: Secondary | ICD-10-CM

## 2015-01-08 DIAGNOSIS — K210 Gastro-esophageal reflux disease with esophagitis: Secondary | ICD-10-CM

## 2015-01-08 DIAGNOSIS — R0600 Dyspnea, unspecified: Secondary | ICD-10-CM

## 2015-01-08 DIAGNOSIS — R0689 Other abnormalities of breathing: Secondary | ICD-10-CM

## 2015-01-08 DIAGNOSIS — I10 Essential (primary) hypertension: Secondary | ICD-10-CM

## 2015-01-08 LAB — POCT I-STAT 3, ART BLOOD GAS (G3+)
Bicarbonate: 24 mEq/L (ref 20.0–24.0)
O2 Saturation: 91 %
PH ART: 7.42 (ref 7.350–7.450)
TCO2: 25 mmol/L (ref 0–100)
pCO2 arterial: 36.9 mmHg (ref 35.0–45.0)
pO2, Arterial: 57 mmHg — ABNORMAL LOW (ref 80.0–100.0)

## 2015-01-08 LAB — BLOOD GAS, ARTERIAL
ACID-BASE DEFICIT: 0.7 mmol/L (ref 0.0–2.0)
Bicarbonate: 23.3 mEq/L (ref 20.0–24.0)
DELIVERY SYSTEMS: POSITIVE
DRAWN BY: 277551
Expiratory PAP: 5
FIO2: 0.3
INSPIRATORY PAP: 13
O2 Saturation: 97 %
PCO2 ART: 37.1 mmHg (ref 35.0–45.0)
PH ART: 7.414 (ref 7.350–7.450)
Patient temperature: 98.6
TCO2: 24.4 mmol/L (ref 0–100)
pO2, Arterial: 93.6 mmHg (ref 80.0–100.0)

## 2015-01-08 LAB — INFLUENZA PANEL BY PCR (TYPE A & B)
H1N1FLUPCR: NOT DETECTED
INFLAPCR: NEGATIVE
INFLBPCR: NEGATIVE

## 2015-01-08 LAB — GLUCOSE, CAPILLARY
GLUCOSE-CAPILLARY: 201 mg/dL — AB (ref 65–99)
GLUCOSE-CAPILLARY: 287 mg/dL — AB (ref 65–99)
Glucose-Capillary: 248 mg/dL — ABNORMAL HIGH (ref 65–99)
Glucose-Capillary: 220 mg/dL — ABNORMAL HIGH (ref 65–99)
Glucose-Capillary: 118 mg/dL — ABNORMAL HIGH (ref 65–99)

## 2015-01-08 LAB — CREATININE, SERUM: Creatinine, Ser: 0.75 mg/dL (ref 0.44–1.00)

## 2015-01-08 LAB — CBC
HEMATOCRIT: 33.9 % — AB (ref 36.0–46.0)
HEMOGLOBIN: 10.3 g/dL — AB (ref 12.0–15.0)
MCH: 25.4 pg — ABNORMAL LOW (ref 26.0–34.0)
MCHC: 30.4 g/dL (ref 30.0–36.0)
MCV: 83.5 fL (ref 78.0–100.0)
Platelets: 232 10*3/uL (ref 150–400)
RBC: 4.06 MIL/uL (ref 3.87–5.11)
RDW: 17 % — ABNORMAL HIGH (ref 11.5–15.5)
WBC: 6.5 10*3/uL (ref 4.0–10.5)

## 2015-01-08 LAB — FERRITIN: Ferritin: 14 ng/mL (ref 11–307)

## 2015-01-08 LAB — TSH: TSH: 0.533 u[IU]/mL (ref 0.350–4.500)

## 2015-01-08 LAB — FOLATE: FOLATE: 23 ng/mL (ref >5.9)

## 2015-01-08 LAB — MRSA PCR SCREENING: MRSA by PCR: POSITIVE — AB

## 2015-01-08 LAB — PHOSPHORUS: Phosphorus: 1.9 mg/dL — ABNORMAL LOW (ref 2.5–4.6)

## 2015-01-08 LAB — VITAMIN B12: Vitamin B-12: 550 pg/mL (ref 180–914)

## 2015-01-08 LAB — TROPONIN I

## 2015-01-08 LAB — IRON AND TIBC
Iron: 39 ug/dL (ref 28–170)
Saturation Ratios: 9 % — ABNORMAL LOW (ref 10.4–31.8)
TIBC: 440 ug/dL (ref 250–450)
UIBC: 401 ug/dL

## 2015-01-08 MED ORDER — MUPIROCIN 2 % EX OINT
1.0000 "application " | TOPICAL_OINTMENT | Freq: Two times a day (BID) | CUTANEOUS | Status: AC
  Administered 2015-01-08 – 2015-01-12 (×10): 1 via NASAL
  Filled 2015-01-08 (×2): qty 22

## 2015-01-08 MED ORDER — FUROSEMIDE 10 MG/ML IJ SOLN
40.0000 mg | Freq: Two times a day (BID) | INTRAMUSCULAR | Status: AC
  Administered 2015-01-08 – 2015-01-10 (×4): 40 mg via INTRAVENOUS
  Filled 2015-01-08 (×5): qty 4

## 2015-01-08 MED ORDER — CHLORHEXIDINE GLUCONATE 0.12 % MT SOLN
15.0000 mL | Freq: Two times a day (BID) | OROMUCOSAL | Status: DC
  Administered 2015-01-08 – 2015-01-13 (×9): 15 mL via OROMUCOSAL
  Filled 2015-01-08 (×7): qty 15

## 2015-01-08 MED ORDER — INSULIN ASPART 100 UNIT/ML ~~LOC~~ SOLN
0.0000 [IU] | SUBCUTANEOUS | Status: DC
  Administered 2015-01-08: 7 [IU] via SUBCUTANEOUS
  Administered 2015-01-09: 15 [IU] via SUBCUTANEOUS
  Administered 2015-01-09: 11 [IU] via SUBCUTANEOUS
  Administered 2015-01-09: 7 [IU] via SUBCUTANEOUS
  Administered 2015-01-09 (×2): 4 [IU] via SUBCUTANEOUS
  Administered 2015-01-09: 7 [IU] via SUBCUTANEOUS
  Administered 2015-01-10: 20 [IU] via SUBCUTANEOUS
  Administered 2015-01-10: 15 [IU] via SUBCUTANEOUS
  Administered 2015-01-10: 11 [IU] via SUBCUTANEOUS
  Administered 2015-01-10: 15 [IU] via SUBCUTANEOUS
  Administered 2015-01-10: 7 [IU] via SUBCUTANEOUS
  Administered 2015-01-10: 15 [IU] via SUBCUTANEOUS
  Administered 2015-01-10: 3 [IU] via SUBCUTANEOUS
  Administered 2015-01-11: 15 [IU] via SUBCUTANEOUS
  Administered 2015-01-11: 4 [IU] via SUBCUTANEOUS
  Administered 2015-01-11: 11 [IU] via SUBCUTANEOUS
  Administered 2015-01-11: 15 [IU] via SUBCUTANEOUS
  Administered 2015-01-11: 4 [IU] via SUBCUTANEOUS
  Administered 2015-01-11: 15 [IU] via SUBCUTANEOUS
  Administered 2015-01-12 (×2): 11 [IU] via SUBCUTANEOUS
  Administered 2015-01-12: 7 [IU] via SUBCUTANEOUS
  Administered 2015-01-12: 4 [IU] via SUBCUTANEOUS
  Administered 2015-01-12: 15 [IU] via SUBCUTANEOUS
  Administered 2015-01-13: 7 [IU] via SUBCUTANEOUS
  Administered 2015-01-13: 8 [IU] via SUBCUTANEOUS
  Administered 2015-01-13: 11 [IU] via SUBCUTANEOUS
  Administered 2015-01-13: 4 [IU] via SUBCUTANEOUS

## 2015-01-08 MED ORDER — PANTOPRAZOLE SODIUM 40 MG PO TBEC
40.0000 mg | DELAYED_RELEASE_TABLET | Freq: Two times a day (BID) | ORAL | Status: DC
  Administered 2015-01-08: 40 mg via ORAL
  Filled 2015-01-08: qty 1

## 2015-01-08 MED ORDER — MORPHINE SULFATE (PF) 2 MG/ML IV SOLN
1.0000 mg | INTRAVENOUS | Status: DC | PRN
  Administered 2015-01-08 – 2015-01-11 (×6): 2 mg via INTRAVENOUS
  Filled 2015-01-08 (×6): qty 1

## 2015-01-08 MED ORDER — HYDROCOD POLST-CPM POLST ER 10-8 MG/5ML PO SUER
5.0000 mL | Freq: Two times a day (BID) | ORAL | Status: DC
  Administered 2015-01-08 – 2015-01-10 (×6): 5 mL via ORAL
  Filled 2015-01-08 (×6): qty 5

## 2015-01-08 MED ORDER — LORAZEPAM 2 MG/ML IJ SOLN
0.2500 mg | INTRAMUSCULAR | Status: DC | PRN
  Administered 2015-01-09 – 2015-01-11 (×3): 0.25 mg via INTRAVENOUS
  Filled 2015-01-08 (×3): qty 1

## 2015-01-08 MED ORDER — K PHOS MONO-SOD PHOS DI & MONO 155-852-130 MG PO TABS
500.0000 mg | ORAL_TABLET | Freq: Two times a day (BID) | ORAL | Status: AC
  Administered 2015-01-08 – 2015-01-09 (×4): 500 mg via ORAL
  Filled 2015-01-08 (×5): qty 2

## 2015-01-08 MED ORDER — BENZONATATE 100 MG PO CAPS
100.0000 mg | ORAL_CAPSULE | Freq: Three times a day (TID) | ORAL | Status: DC
  Administered 2015-01-08 – 2015-01-13 (×16): 100 mg via ORAL
  Filled 2015-01-08 (×18): qty 1

## 2015-01-08 MED ORDER — METHYLPREDNISOLONE SODIUM SUCC 125 MG IJ SOLR
80.0000 mg | Freq: Four times a day (QID) | INTRAMUSCULAR | Status: DC
  Administered 2015-01-08 – 2015-01-12 (×17): 80 mg via INTRAVENOUS
  Filled 2015-01-08 (×2): qty 1.28
  Filled 2015-01-08 (×2): qty 2
  Filled 2015-01-08: qty 1.28
  Filled 2015-01-08 (×2): qty 2
  Filled 2015-01-08: qty 1.28
  Filled 2015-01-08: qty 2
  Filled 2015-01-08 (×3): qty 1.28
  Filled 2015-01-08: qty 2
  Filled 2015-01-08: qty 1.28
  Filled 2015-01-08: qty 2
  Filled 2015-01-08: qty 1.28
  Filled 2015-01-08 (×2): qty 2
  Filled 2015-01-08 (×3): qty 1.28

## 2015-01-08 MED ORDER — ARFORMOTEROL TARTRATE 15 MCG/2ML IN NEBU
15.0000 ug | INHALATION_SOLUTION | Freq: Two times a day (BID) | RESPIRATORY_TRACT | Status: DC
  Administered 2015-01-08 – 2015-01-09 (×2): 15 ug via RESPIRATORY_TRACT
  Filled 2015-01-08 (×4): qty 2

## 2015-01-08 MED ORDER — LEVALBUTEROL HCL 0.63 MG/3ML IN NEBU: 0.6300 mg | INHALATION_SOLUTION | RESPIRATORY_TRACT | Status: DC | PRN

## 2015-01-08 MED ORDER — MIDAZOLAM HCL 2 MG/2ML IJ SOLN
0.2500 mg | INTRAMUSCULAR | Status: DC | PRN
  Administered 2015-01-10: 0.25 mg via INTRAVENOUS
  Filled 2015-01-08: qty 2

## 2015-01-08 MED ORDER — BUDESONIDE 0.25 MG/2ML IN SUSP
0.2500 mg | Freq: Two times a day (BID) | RESPIRATORY_TRACT | Status: DC
  Administered 2015-01-08: 0.25 mg via RESPIRATORY_TRACT
  Filled 2015-01-08: qty 2

## 2015-01-08 MED ORDER — CETYLPYRIDINIUM CHLORIDE 0.05 % MT LIQD
7.0000 mL | Freq: Two times a day (BID) | OROMUCOSAL | Status: DC
  Administered 2015-01-09 – 2015-01-13 (×9): 7 mL via OROMUCOSAL

## 2015-01-08 MED ORDER — LORAZEPAM 2 MG/ML IJ SOLN
0.2500 mg | Freq: Four times a day (QID) | INTRAMUSCULAR | Status: DC | PRN
  Administered 2015-01-08: 0.25 mg via INTRAVENOUS
  Filled 2015-01-08: qty 1

## 2015-01-08 MED ORDER — BUDESONIDE 0.5 MG/2ML IN SUSP
0.5000 mg | Freq: Two times a day (BID) | RESPIRATORY_TRACT | Status: DC
  Administered 2015-01-08 – 2015-01-13 (×11): 0.5 mg via RESPIRATORY_TRACT
  Filled 2015-01-08 (×13): qty 2

## 2015-01-08 MED ORDER — CHLORHEXIDINE GLUCONATE CLOTH 2 % EX PADS
6.0000 | MEDICATED_PAD | Freq: Every day | CUTANEOUS | Status: DC
  Administered 2015-01-08 – 2015-01-10 (×3): 6 via TOPICAL

## 2015-01-08 MED ORDER — POTASSIUM CHLORIDE IN NACL 20-0.9 MEQ/L-% IV SOLN
INTRAVENOUS | Status: AC
  Administered 2015-01-08: 1000 mL via INTRAVENOUS
  Filled 2015-01-08: qty 1000

## 2015-01-08 MED ORDER — MORPHINE SULFATE (PF) 2 MG/ML IV SOLN
2.0000 mg | Freq: Once | INTRAVENOUS | Status: AC
  Administered 2015-01-08: 2 mg via INTRAVENOUS

## 2015-01-08 NOTE — Progress Notes (Signed)
Pt c/o chest pressure and dyspnea for second time today. Bipap in place. BP 122/77, RR 30, t 97.8(A), HR 115. Given PRN morphine and ativan. PRN medication noted to be effective and pt denies chest pressure. Pt will be transferred to ICU per order.

## 2015-01-08 NOTE — Consult Note (Signed)
Name: Patricia Wall MRN: [id number] DOB: [birth year]    ADMISSION DATE:  01/07/2015 CONSULTATION DATE:  10/20  REFERRING MD :  Rito Ehrlich  CHIEF COMPLAINT:  SOB  BRIEF PATIENT DESCRIPTION: 64 year old female presented to ED 10/19 after found to be hypoxic with sats in 80% range at routine cardiologist appointment. She was admitted for presumed COPD exacerbation, which worsened 10/20 requiring BiPAP. P CCM was consulted for further evaluation.  SIGNIFICANT EVENTS  10/19 > admitted  STUDIES:  12/12/12 PFT>  FEV1 1.41 (53%) ratio 54 with no significant reversibility and DLCO 47% corrects to 50 10/20 Echo >>>   HISTORY OF PRESENT ILLNESS:  64 year old female with past medical history as below, which includes, CAD, COPD (MW patient) on 2 L nocturnal O2, tobacco abuse, GERD, right ICA stenosis, and alcohol abuse. She states she has had progressive shortness of breath for the last 2 or 3 months at which point she was freely able to participate in all ADLs without complication. However, over the past 2-3 weeks of shortness of breath significant worsened. She also developed cough productive of thick green/yellow secretions. Also complains of subjective fever. Heartburn is described as an issue for her sinus congestion is not. 10/19 she presented to cardiology office for routine visit with Dr. Eden Emms. He noted her to be tachypneic using accessory muscles, and she was tachycardic with rates in the 120s. Resting O2 sat on room air 87%. He referred her to the emergency department. CXR was obtained in emergency department showed no acute cardio pulmonary process. She had notable wheeze on exam and was administered bronchodilators and steroids. She is admitted to the stepdown unit under the hospitalist for management of acute COPD exacerbation. She was started on scheduled bronchodilators and scheduled IV Solu-Medrol. Also started on IV Levaquin for COPD exacerbation and possible pneumonia. 10/20 she had  decompensated respiratory status requiring BiPAP. Hypoxemia into the mid 80s despite supplemental oxygen. P CCM was consulted for further evaluation.  PAST MEDICAL HISTORY :   has a past medical history of Chest pain; HTN (hypertension); TIA (transient ischemic attack); CAD (coronary artery disease); PVD (peripheral vascular disease) (HCC); Abnormal chest xray; Vertigo; Lymphadenitis; Pharyngitis; Sinusitis; Insomnia; Cyanosis; Tobacco abuse; IBS (irritable bowel syndrome); Diarrhea; Hemorrhoids; DDD (degenerative disc disease); Alcohol abuse; PUD (peptic ulcer disease); Back pain; GERD (gastroesophageal reflux disease); Diverticular disease; Depression; COPD (chronic obstructive pulmonary disease) (HCC); and H/O: hysterectomy.  has past surgical history that includes Appendectomy; Tubal ligation; Cardiac catheterization (2005, 2006, 2009,2011); Coronary stent placement; Left Bunionectomy; Hammertoe repair; left heart catheterization with coronary angiogram (N/A, 12/03/2013); Abdominal hysterectomy; Cholecystectomy; and Breast lumpectomy (Left, 2015). Prior to Admission medications   Medication Sig Start Date End Date Taking? Authorizing Provider  albuterol (PROAIR HFA) 108 (90 BASE) MCG/ACT inhaler Inhale 2 puffs into the lungs every 4 (four) hours as needed for wheezing or shortness of breath.   Yes Historical Provider, MD  aspirin 81 MG tablet Take 81 mg by mouth at bedtime.    Yes Historical Provider, MD  atorvastatin (LIPITOR) 40 MG tablet Take 40 mg by mouth at bedtime.   Yes Historical Provider, MD  budesonide-formoterol (SYMBICORT) 160-4.5 MCG/ACT inhaler Inhale 2 puffs into the lungs 2 (two) times daily. 05/30/14  Yes Tammy S Parrett, NP  calcium carbonate (OS-CAL) 600 MG TABS Take 600 mg by mouth every morning.    Yes Historical Provider, MD  carvedilol (COREG) 3.125 MG tablet TAKE 1/2 TABLET  BY MOUTH 2 (TWO) TIMES DAILY WITH A MEAL. 12/18/14  Yes Nyoka Cowden, MD  chlorzoxazone (PARAFON) 500  MG tablet Take 500 mg by mouth 3 (three) times daily as needed (headache). Take 1-1 1/2 tablets every 8 hours as needed for headache   Yes Historical Provider, MD  Cholecalciferol (VITAMIN D) 2000 UNITS tablet Take 2,000 Units by mouth every morning.    Yes Historical Provider, MD  clopidogrel (PLAVIX) 75 MG tablet Take 75 mg by mouth every morning.    Yes Historical Provider, MD  dextromethorphan-guaiFENesin (MUCINEX DM) 30-600 MG per 12 hr tablet Take 1 tablet by mouth 2 (two) times daily.   Yes Historical Provider, MD  Diclofenac Potassium (CAMBIA) 50 MG PACK Take 50 mg by mouth daily as needed (headache). 4-6 TIMES BY MOUTH PER MONTH 12/18/14 12/18/15 Yes Historical Provider, MD  DULoxetine (CYMBALTA) 60 MG capsule Take 60 mg by mouth 2 (two) times daily.    Yes Historical Provider, MD  gabapentin (NEURONTIN) 300 MG capsule Take 600 mg by mouth 2 (two) times daily.    Yes Historical Provider, MD  letrozole (FEMARA) 2.5 MG tablet Take 2.5 mg by mouth every morning.    Yes Historical Provider, MD  metFORMIN (GLUCOPHAGE) 500 MG tablet Take 1,000 mg by mouth 2 (two) times daily.  04/03/14 04/03/15 Yes Historical Provider, MD  Multiple Vitamin (MULTIVITAMIN) tablet Take 1 tablet by mouth every morning.    Yes Historical Provider, MD  niacin (NIASPAN) 1000 MG CR tablet Take 2,000 mg by mouth at bedtime. 2 tabs by mouth at bedtime   Yes Historical Provider, MD  nitroGLYCERIN (NITROSTAT) 0.4 MG SL tablet Place 0.4 mg under the tongue every 5 (five) minutes as needed for chest pain (may repeat x3).    Yes Historical Provider, MD  omeprazole (PRILOSEC) 40 MG capsule Take 40 mg by mouth daily before breakfast.    Yes Historical Provider, MD  ondansetron (ZOFRAN) 8 MG tablet Take 8 mg by mouth every 4 (four) hours as needed for nausea.    Yes Historical Provider, MD  orphenadrine (NORFLEX) 100 MG tablet Take 100 mg by mouth every 12 (twelve) hours as needed for muscle spasms. FOR MUSCLE SPASMS 12/24/14 12/24/15 Yes  Historical Provider, MD  OXYGEN Inhale 2 L into the lungs at bedtime.   Yes Historical Provider, MD  Respiratory Therapy Supplies (FLUTTER) DEVI Use as directed 07/25/14  Yes Nyoka Cowden, MD  SPIRIVA HANDIHALER 18 MCG inhalation capsule PLACE 1 CAPSULE (18 MCG TOTAL) INTO INHALER AND INHALE DAILY. Patient taking differently: Take 2 puffs by mouth every morning 06/09/14  Yes Tammy S Parrett, NP  sucralfate (CARAFATE) 1 G tablet Take 1 tablet (1 g total) by mouth 4 (four) times daily -  with meals and at bedtime. Patient not taking: Reported on 01/07/2015 04/25/14   Gwyneth Sprout, MD   No Known Allergies  FAMILY HISTORY:  family history includes Asthma in her sister; Cancer - Other in her father and mother; Coronary artery disease in an other family member; Emphysema in her father; Heart disease in her father and mother; Rheumatologic disease in her mother; Stroke in her sister. SOCIAL HISTORY:  reports that she has been smoking Cigarettes.  She started smoking about 54 years ago. She has a 104 pack-year smoking history. She does not have any smokeless tobacco history on file. She reports that she uses illicit drugs. She reports that she does not drink alcohol.  REVIEW OF SYSTEMS:   Bolds are positive  Constitutional: weight loss, gain, night sweats, Fevers, chills, fatigue .  HEENT: headaches, Sore throat, sneezing, nasal congestion, post nasal drip, Difficulty swallowing, Tooth/dental problems, visual complaints visual changes, ear ache CV:  chest pain, radiates: ,Orthopnea, PND, swelling in lower extremities, dizziness, palpitations, syncope.  GI  heartburn, indigestion, abdominal pain, nausea, vomiting, diarrhea, change in bowel habits, loss of appetite, bloody stools.  Resp: cough, productive:green/yellow , hemoptysis, dyspnea, chest pain, pleuritic.  Skin: rash or itching or icterus GU: dysuria, change in color of urine, urgency or frequency. flank pain, hematuria  MS: joint pain or  swelling. decreased range of motion  Psych: change in mood or affect. depression or anxiety.  Neuro: difficulty with speech, weakness, numbness, ataxia    SUBJECTIVE:   VITAL SIGNS: Temp:  [97.5 F (36.4 C)-104.7 F (40.4 C)] 97.5 F (36.4 C) (10/20 1126) Pulse Rate:  [95-133] 119 (10/20 1230) Resp:  [20-27] 25 (10/20 1126) BP: (98-153)/(51-90) 144/90 mmHg (10/20 1230) SpO2:  [89 %-100 %] 93 % (10/20 1145) FiO2 (%):  [40 %] 40 % (10/20 1230) Weight:  [94.802 kg (209 lb)-96.616 kg (213 lb)] 96.616 kg (213 lb) (10/19 2206)  PHYSICAL EXAMINATION: General:  Morbidly obese female in NAD on BiPAP Neuro:  Anxious on BiPAP, alert,orietned, non-focal HEENT:  NCAT, PERRL, no appreciable JVD Cardiovascular:  Tachy, regular, no MRG Lungs:  Severely labored when off BiPAP, significant wheeze primarily on expiration. Abdomen:  Soft, nontender, nondistended Musculoskeletal:  No acute deformities or ROM limitations. Skin:  Grossly intact   Recent Labs Lab 01/07/15 1539 01/07/15 2336  NA 141  --   K 3.8  --   CL 100*  --   CO2 25  --   BUN 8  --   CREATININE 0.81 0.75  GLUCOSE 184*  --     Recent Labs Lab 01/07/15 1539 01/07/15 2336  HGB 11.3* 10.3*  HCT 37.9 33.9*  WBC 9.9 6.5  PLT 284 232   Dg Chest Port 1 View  01/08/2015  CLINICAL DATA:  SOB,HX COPD EXAM: PORTABLE CHEST 1 VIEW COMPARISON:  01/07/2015 FINDINGS: Midline trachea. Normal heart size. Atherosclerosis in the transverse aorta. No right and no definite left pleural effusion. No pneumothorax. Increased density projecting over the lung bases, greater on the left. Favored to be due to overlying breast tissues. Interstitial thickening is lower lobe predominant. No lobar consolidation. IMPRESSION: No acute cardiopulmonary disease. degraded evaluation of the inferior chest, secondary to overlying breast tissues. Peribronchial thickening which may relate to chronic bronchitis or smoking. Atherosclerosis. Electronically  Signed   By: Jeronimo Greaves M.D.   On: 01/08/2015 11:14   Dg Chest Port 1 View  01/07/2015  CLINICAL DATA:  Productive cough for 3 weeks.  Hypoxia. EXAM: PORTABLE CHEST 1 VIEW COMPARISON:  Jul 25, 2014. FINDINGS: The heart size and mediastinal contours are within normal limits. No pneumothorax or pleural effusion is noted. Stable bibasilar interstitial densities are noted most consistent with scarring. No acute pulmonary disease is noted. The visualized skeletal structures are unremarkable. IMPRESSION: No acute cardiopulmonary abnormality seen. Electronically Signed   By: Lupita Raider, M.D.   On: 01/07/2015 16:32    ASSESSMENT / PLAN:   PULMONARY: A: COPD with acute exacerbation Acute hypoxemic respiratory failure Tobacco abuse disorder  Discussion: She presents as a COPD exacerbation, however, cannot rule out pneumonia in setting of fevers and productive cough. Also concern for acute CHF in setting of extensive cardiac history, however chest x-ray and BNP would not support that. Echocardiogram is pending. ABG demonstrates hypoxemia and when consider with tachycardia,  pulmonary embolism is in the differential but less likely when significant expiratory wheeze is considered. Flu Pcr neg  P: -Continue BiPAP goal 4 hours on 1 hour off for today/tonight.  -Low-dose Ativan to assist with BiPAP compliance. -Will adjust nebulizers to brovana, budesonide -Solumedrol IV, monitor glucose -Will maximize GERD treatment -Repeat chest x-ray in morning -Repeat ABG -Continue levofloxacin -Check procalcitonin -Culture sputum -Follow-up echocardiogram  CARDIOVASCULAR A:  Suspect acute CHF exacerbation Tachycardia, sinus  H/o CAD s/p 4 stents  P:  Tele Follow Echo Lasix as tolerated Strict I&O Keep negative balance Trop neg Lactic WNL Hold coreg, consider cardioselective BB when restarting  RENAL A:   Mildly elevated AG acidosis >lactic (cleared)  P:   Repeat Bmet Follow  electrolytes with new diuresis  GASTROINTESTINAL A:   GERD PUD  P:   Protonix to BID NPO on BiPAP  HEMATOLOGIC A:   Anemia, baseline Hgb 11\ H/o breast Ca s/p lumpectomy L breast  P:  Repeat CBC VTE dose lovenox SCD On plavix, asa 81  INFECTIOUS A:   ? CAP  P:   BCx2 10/19 > Sputum 10/20 > Abx: levofloxacin, start date 10/19 >>> PCT algo  ENDOCRINE A:   Hyperglycemia on steroids  P:   CBG monitoring and SSI  NEUROLOGIC A:   No acute issues  P:   RASS goal: 0 Low dose ativan for BiPAP compliance Hold neurontin   FAMILY  - Updates: Discussed plan with patient and family 10/20   - Inter-disciplinary family meet or Palliative Care meeting due by:  10/27 >   Joneen RoachPaul Hoffman, AGACNP-BC [facility] Pager [phone] or [phone]  01/08/2015 1:52 PM   STAFF NOTE: Cindi CarbonI, Kalin Kyler, MD FACP have personally reviewed patient's available data, including medical history, events of note, physical examination and test results as part of my evaluation. I have discussed with resident/NP and other care providers such as pharmacist, RN and RRT. In addition, I personally evaluated patient and elicited key findings of: mild to moderate distress, on NIMV, pcxr with int prominence, noted fever x 1 per daughter, concern remains for viral or atypical pNA with underlying copd, she requires continuous BIPAP with a plan for 4 hrs on and 1 hr off scheduled, would also add lasix , she has int changes, JVD high, also obtain echo, last reported 2005, maintain current atypical coverage and move to icu, no repeat ABG needed at this stage, keep npo, as ay need ett The patient is critically ill with multiple organ systems failure and requires high complexity decision making for assessment and support, frequent evaluation and titration of therapies, application of advanced monitoring technologies and extensive interpretation of multiple databases.   Critical  Care Time devoted to patient care services described in this note is 35 Minutes. This time reflects time of care of this signee: Rory Percyaniel Geoffrey Mankin, MD FACP. This critical care time does not reflect procedure time, or teaching time or supervisory time of PA/NP/Med student/Med Resident etc but could involve care discussion time. Rest per NP/medical resident whose note is outlined above and that I agree with   Mcarthur Rossettianiel J. Tyson AliasFeinstein, MD, FACP Pgr: [phone]  Pulmonary & Critical Care 01/08/2015 4:11 PM

## 2015-01-08 NOTE — Progress Notes (Signed)
Pt c/o midsternal chest pressure and presents with dyspnea. BP 133/77, RR 28, T 97.9(A) HR 122. Notified MD. EKG ordered. Sinus tach noted. Given morphine 2mg  IV. Pt did not have effectiveness from first dose. MD notified again and ordered STAT morphine 2mg  again. Given and pt breathing pattern became normal and pt denies chest pressure.

## 2015-01-08 NOTE — Progress Notes (Signed)
Bipap startedd due to increased WOB.

## 2015-01-08 NOTE — Progress Notes (Signed)
Per Dr. Tyson AliasFeinstein, patient will get order for 0.25 of Versed. If this amount is ineffective, ok for RN to increase to 0.5

## 2015-01-08 NOTE — Progress Notes (Signed)
Given orders to transfer pt to ICU. Given report to receiving nurse on 79M. Will transfer pt with bipap and respiratory therapist.

## 2015-01-08 NOTE — Progress Notes (Signed)
Patient alert and oriented. E-link notified patient has arrived on unit. Family is in the waiting area. Bp 100/59 HR 113 RR 17.

## 2015-01-08 NOTE — Progress Notes (Signed)
eLink Physician-Brief Progress Note Patient Name: Vella RedheadJoan B Winget DOB: [birth year] MRN: [id number]   Date of Service  01/08/2015  HPI/Events of Note  Anxiety.  eICU Interventions  Will increase Ativan 0.25 mg IV to Q 4 hours PRN.     Intervention Category Minor Interventions: Agitation / anxiety - evaluation and management  Lydiana Milley Eugene 01/08/2015, 5:42 PM

## 2015-01-08 NOTE — Progress Notes (Signed)
Utilization Review Completed.  

## 2015-01-08 NOTE — Progress Notes (Signed)
[facility] PROGRESS NOTE  Patricia Wall WUJ:[id number] DOB: [birth year] DOA: 01/07/2015  PCP: [name], MD  Brief HPI: 64 year old Caucasian female with a past medical history of COPD on nocturnal home oxygen, tobacco abuse, GERD, coronary artery disease was sent over to the hospital by her cardiologist due to hypoxia in the 80s. She also mentioned shortness of breath for the past few days with cough. She was producing yellowish color sputum. She was hospitalized for further management of COPD exacerbation.  Past medical history:  Past Medical History  Diagnosis Date  . Chest pain   . HTN (hypertension)   . TIA (transient ischemic attack)   . CAD (coronary artery disease)      Prev seen by [facility].  Stent to OM and LAD Last cath 6/11 cathed on June 13, [name]. 2011.  The cardiac catheterization showed 30% and 50% lesions in the mid  and distal LAD, 50% in the OM1, 40% in the OM2 with 20% in-stent  restenosis, 40% in-stent restenosis in the circumflex and no significant  disease in the RCA.  Her EF was normal.  Dr. Juanda Chance recommended a trial  of proton pump inhibitors   . PVD (peripheral vascular disease) (HCC)   . Abnormal chest xray   . Vertigo   . Lymphadenitis   . Pharyngitis   . Sinusitis   . Insomnia   . Cyanosis   . Tobacco abuse   . IBS (irritable bowel syndrome)   . Diarrhea   . Hemorrhoids   . DDD (degenerative disc disease)   . Alcohol abuse   . PUD (peptic ulcer disease)   . Back pain   . GERD (gastroesophageal reflux disease)   . Diverticular disease   . Depression   . COPD (chronic obstructive pulmonary disease) (HCC)   . H/O: hysterectomy     Consultants: Pulmonology  Procedures: None  Antibiotics: Levaquin  Subjective: Patient continues to feel short of breath this morning. She complains of wheezing and some chest tightness. She states that she is feeling slightly better compared to last night. Her husband and daughter were at  bedside.  Objective: Vital Signs  Filed Vitals:   01/07/15 2139 01/07/15 2206 01/08/15 0000 01/08/15 0400  BP:  130/66 140/81 148/77  Pulse:  108 95 109  Temp: 98.6 F (37 C) 97.7 F (36.5 C) 97.5 F (36.4 C) 97.8 F (36.6 C)  TempSrc: Oral Oral Oral Oral  Resp:      Height:   (1.676 m)    Weight:  96.616 kg (213 lb)    SpO2:  96% 97% 95%    Intake/Output Summary (Last 24 hours) at 01/08/15 0735 Last data filed at 01/08/15 0600  Gross per 24 hour  Intake 1937.5 ml  Output      0 ml  Net 1937.5 ml   Filed Weights   01/07/15 2206  Weight: 96.616 kg (213 lb)    General appearance: alert, cooperative, in some respiratory distress Resp: Diffuse wheezing heard bilaterally. No definite crackles. Cardio: S1, S2 is tachycardic. Regular. No S3, S4. No rubs, murmurs, or bruit. GI: soft, non-tender; bowel sounds normal; no masses,  no organomegaly Extremities: extremities normal, atraumatic, no cyanosis or edema Neurologic: Alert and oriented 3. No focal neurological deficits are noted.  Lab Results:  Basic Metabolic Panel:  Recent Labs Lab 01/07/15 1539 01/07/15 2336  NA 141  --   K 3.8  --   CL 100*  --   CO2 25  --  GLUCOSE 184*  --   BUN 8  --   CREATININE 0.81 0.75  CALCIUM 9.7  --   PHOS  --  1.9*   CBC:  Recent Labs Lab 01/07/15 1539 01/07/15 2336  WBC 9.9 6.5  NEUTROABS 6.3  --   HGB 11.3* 10.3*  HCT 37.9 33.9*  MCV 84.6 83.5  PLT 284 232   Cardiac Enzymes:  Recent Labs Lab 01/07/15 2336 01/08/15 0320  TROPONINI <0.03 <0.03   BNP (last 3 results)  Recent Labs  01/07/15 1605  BNP 52.1   CBG:  Recent Labs Lab 01/08/15 0016  GLUCAP 287*    Recent Results (from the past 240 hour(s))  MRSA PCR Screening     Status: Abnormal   Collection Time: 01/07/15 10:36 PM  Result Value Ref Range Status   MRSA by PCR POSITIVE (A) NEGATIVE Final    Comment:        The GeneXpert MRSA Assay (FDA approved for NASAL specimens only), is  one component of a comprehensive MRSA colonization surveillance program. It is not intended to diagnose MRSA infection nor to guide or monitor treatment for MRSA infections. RESULT CALLED TO, READ BACK BY AND VERIFIED WITH: MICHELLE  01/08/15 MKELLY       Studies/Results: Dg Chest Port 1 View  01/07/2015  CLINICAL DATA:  Productive cough for 3 weeks.  Hypoxia. EXAM: PORTABLE CHEST 1 VIEW COMPARISON:  Jul 25, 2014. FINDINGS: The heart size and mediastinal contours are within normal limits. No pneumothorax or pleural effusion is noted. Stable bibasilar interstitial densities are noted most consistent with scarring. No acute pulmonary disease is noted. The visualized skeletal structures are unremarkable. IMPRESSION: No acute cardiopulmonary abnormality seen. Electronically Signed   By: Lupita Raider, M.D.   On: 01/07/2015 16:32    Medications:  Scheduled: . aspirin  81 mg Oral QHS  . atorvastatin  40 mg Oral QHS  . benzonatate  100 mg Oral TID  . budesonide (PULMICORT) nebulizer solution  0.25 mg Nebulization BID  . calcium carbonate  1 tablet Oral Q breakfast  . carvedilol  3.125 mg Oral BID WC  . Chlorhexidine Gluconate Cloth  6 each Topical Q0600  . chlorpheniramine-HYDROcodone  5 mL Oral Q12H  . clopidogrel  75 mg Oral Daily  . DULoxetine  60 mg Oral BID  . enoxaparin (LOVENOX) injection  40 mg Subcutaneous QHS  . gabapentin  600 mg Oral BID  . insulin aspart  0-20 Units Subcutaneous TID WC  . ipratropium  0.5 mg Nebulization QID  . letrozole  2.5 mg Oral Daily  . levalbuterol  1.25 mg Nebulization QID  . levofloxacin (LEVAQUIN) IV  750 mg Intravenous Q24H  . metFORMIN  1,000 mg Oral BID WC  . methylPREDNISolone (SOLU-MEDROL) injection  80 mg Intravenous 4 times per day  . multivitamin with minerals  1 tablet Oral Daily  . mupirocin ointment  1 application Nasal BID  . niacin  2,000 mg Oral QHS  . pantoprazole  40 mg Oral Daily  . phosphorus  500 mg Oral BID    Continuous: . 0.9 % NaCl with KCl 20 mEq / L     VWU:[id number], levalbuterol, morphine injection, nitroGLYCERIN, ondansetron (ZOFRAN) IV, orphenadrine  Assessment/Plan:  Principal Problem:   Acute respiratory insufficiency Active Problems:   Essential hypertension   GERD   Irritable bowel syndrome   Chest pain   COPD exacerbation (HCC)   CAD (coronary artery disease)    Acute respiratory failure with  hypoxia Most likely secondary to acute COPD exacerbation. Oxygen saturations are maintained in the 90's on 2-3 L of oxygen by nasal cannula. BiPAP as needed.   Acute COPD exacerbation Continue steroids, antibiotics and nebulizer treatments. ABG will be obtained. Repeat chest x-ray. Since she was febrile yesterday check influenza PCR. BiPAP as needed. Consult pulmonology. Anti-tussive agent. Chest pain is most likely secondary to her acute COPD. EKG did not show any ischemic changes. Troponins were normal. Morphine as needed. Initiate Pulmicort.  History of essential hypertension Monitor blood pressures closely. Continue her current medications.  History of coronary artery disease She is followed closely by cardiology. She's had angioplasy as well as stent placements in the past. Her last intervention was a drug-eluting stent to the circumflex in September 2015. Continue current medications. Continue aspirin, Plavix.  History of diabetes mellitus type 2 Continue sliding scale coverage. Anticipate some worsening in her glycemic control due to steroids. Hold her oral agents for now.   DVT Prophylaxis: Lovenox    Code Status: Full code.  Family Communication: Discussed with the patient and her daughter and husband  Disposition Plan: Will remain in step down for now.    LOS: 1 day   [facility], StvhcsKRISHNAN,Zael Shuman  [facility] Pager [phone] 01/08/2015, 7:35 AM  If 7PM-7AM, please contact night-coverage at [email or web address], password [email or web address]

## 2015-01-09 ENCOUNTER — Inpatient Hospital Stay (HOSPITAL_COMMUNITY): Payer: Medicare Other

## 2015-01-09 DIAGNOSIS — R079 Chest pain, unspecified: Secondary | ICD-10-CM

## 2015-01-09 LAB — GLUCOSE, CAPILLARY
GLUCOSE-CAPILLARY: 199 mg/dL — AB (ref 65–99)
GLUCOSE-CAPILLARY: 209 mg/dL — AB (ref 65–99)
GLUCOSE-CAPILLARY: 228 mg/dL — AB (ref 65–99)
Glucose-Capillary: 163 mg/dL — ABNORMAL HIGH (ref 65–99)
Glucose-Capillary: 259 mg/dL — ABNORMAL HIGH (ref 65–99)
Glucose-Capillary: 322 mg/dL — ABNORMAL HIGH (ref 65–99)

## 2015-01-09 LAB — BASIC METABOLIC PANEL
ANION GAP: 12 (ref 5–15)
Anion gap: 10 (ref 5–15)
BUN: 13 mg/dL (ref 6–20)
BUN: 19 mg/dL (ref 6–20)
CHLORIDE: 101 mmol/L (ref 101–111)
CHLORIDE: 95 mmol/L — AB (ref 101–111)
CO2: 28 mmol/L (ref 22–32)
CO2: 28 mmol/L (ref 22–32)
Calcium: 9 mg/dL (ref 8.9–10.3)
Calcium: 8.4 mg/dL — ABNORMAL LOW (ref 8.9–10.3)
Creatinine, Ser: 0.68 mg/dL (ref 0.44–1.00)
Creatinine, Ser: 0.81 mg/dL (ref 0.44–1.00)
GFR calc Af Amer: >60 mL/min (ref >60)
GFR calc Af Amer: >60 mL/min (ref >60)
GFR calc non Af Amer: >60 mL/min (ref >60)
GFR calc non Af Amer: >60 mL/min (ref >60)
GLUCOSE: 144 mg/dL — AB (ref 65–99)
GLUCOSE: 291 mg/dL — AB (ref 65–99)
POTASSIUM: 3.7 mmol/L (ref 3.5–5.1)
POTASSIUM: 4.1 mmol/L (ref 3.5–5.1)
Sodium: 141 mmol/L (ref 135–145)
Sodium: 133 mmol/L — ABNORMAL LOW (ref 135–145)

## 2015-01-09 LAB — CBC
HEMATOCRIT: 35.4 % — AB (ref 36.0–46.0)
HEMOGLOBIN: 10.5 g/dL — AB (ref 12.0–15.0)
MCH: 24.9 pg — AB (ref 26.0–34.0)
MCHC: 29.7 g/dL — ABNORMAL LOW (ref 30.0–36.0)
MCV: 83.9 fL (ref 78.0–100.0)
Platelets: 324 10*3/uL (ref 150–400)
RBC: 4.22 MIL/uL (ref 3.87–5.11)
RDW: 17.2 % — ABNORMAL HIGH (ref 11.5–15.5)
WBC: 23.6 10*3/uL — ABNORMAL HIGH (ref 4.0–10.5)

## 2015-01-09 LAB — HEMOGLOBIN A1C
HEMOGLOBIN A1C: 7.7 % — AB (ref 4.8–5.6)
MEAN PLASMA GLUCOSE: 174 mg/dL

## 2015-01-09 LAB — PROCALCITONIN: Procalcitonin: <0.1 ng/mL

## 2015-01-09 LAB — MAGNESIUM: Magnesium: 1.5 mg/dL — ABNORMAL LOW (ref 1.7–2.4)

## 2015-01-09 LAB — PHOSPHORUS: PHOSPHORUS: 4 mg/dL (ref 2.5–4.6)

## 2015-01-09 MED ORDER — MORPHINE SULFATE (PF) 2 MG/ML IV SOLN
2.0000 mg | Freq: Once | INTRAVENOUS | Status: AC
  Administered 2015-01-09: 2 mg via INTRAVENOUS
  Filled 2015-01-09: qty 1

## 2015-01-09 MED ORDER — ENOXAPARIN SODIUM 60 MG/0.6ML ~~LOC~~ SOLN
50.0000 mg | Freq: Every day | SUBCUTANEOUS | Status: DC
  Administered 2015-01-09 – 2015-01-12 (×4): 50 mg via SUBCUTANEOUS
  Filled 2015-01-09 (×5): qty 0.6

## 2015-01-09 MED ORDER — BISOPROLOL FUMARATE 10 MG PO TABS
10.0000 mg | ORAL_TABLET | Freq: Every day | ORAL | Status: DC
  Administered 2015-01-09 – 2015-01-13 (×5): 10 mg via ORAL
  Filled 2015-01-09 (×5): qty 1

## 2015-01-09 MED ORDER — PANTOPRAZOLE SODIUM 40 MG PO TBEC
80.0000 mg | DELAYED_RELEASE_TABLET | Freq: Two times a day (BID) | ORAL | Status: DC
  Administered 2015-01-09 – 2015-01-13 (×9): 80 mg via ORAL
  Filled 2015-01-09 (×9): qty 2

## 2015-01-09 MED ORDER — POTASSIUM CHLORIDE 20 MEQ/15ML (10%) PO SOLN
40.0000 meq | Freq: Once | ORAL | Status: AC
  Administered 2015-01-09: 40 meq via ORAL
  Filled 2015-01-09: qty 30

## 2015-01-09 MED ORDER — IPRATROPIUM BROMIDE 0.02 % IN SOLN
0.5000 mg | Freq: Two times a day (BID) | RESPIRATORY_TRACT | Status: DC
  Administered 2015-01-09 – 2015-01-13 (×10): 0.5 mg via RESPIRATORY_TRACT
  Filled 2015-01-09 (×11): qty 2.5

## 2015-01-09 MED ORDER — LEVALBUTEROL HCL 0.63 MG/3ML IN NEBU
0.6300 mg | INHALATION_SOLUTION | RESPIRATORY_TRACT | Status: DC
  Administered 2015-01-09 – 2015-01-13 (×22): 0.63 mg via RESPIRATORY_TRACT
  Filled 2015-01-09 (×33): qty 3

## 2015-01-09 MED ORDER — FAMOTIDINE 20 MG PO TABS
40.0000 mg | ORAL_TABLET | Freq: Every day | ORAL | Status: DC
  Administered 2015-01-09 – 2015-01-12 (×4): 40 mg via ORAL
  Filled 2015-01-09: qty 2
  Filled 2015-01-09: qty 1
  Filled 2015-01-09: qty 2
  Filled 2015-01-09 (×2): qty 1

## 2015-01-09 NOTE — Progress Notes (Signed)
Patient currently on 6 Lpm nasal cannula SATs 94%. States she doesn't need BiPAP at this time. States she feels comfortable and is at her baseline. RN aware. Will continue to monitor.

## 2015-01-09 NOTE — Progress Notes (Signed)
eLink Physician-Brief Progress Note Patient Name: Patricia RedheadJoan B Wall DOB: 1951/02/13 MRN: [id number]   Date of Service  01/09/2015  HPI/Events of Note  Patient c/o leg cramps.   eICU Interventions  Will order: 1. BMP and Magnesium level now.      Intervention Category Intermediate Interventions: Pain - evaluation and management  Mithran Strike Eugene 01/09/2015, 9:50 PM

## 2015-01-09 NOTE — Progress Notes (Signed)
Pt complaining of lower leg cramps. ELink notified.

## 2015-01-09 NOTE — Progress Notes (Signed)
Name: Patricia Wall MRN: [id number] DOB: November 13, 1950    ADMISSION DATE:  01/07/2015 CONSULTATION DATE:  10/20  REFERRING MD :  Rito Ehrlich  CHIEF COMPLAINT:  SOB  BRIEF PATIENT DESCRIPTION: 64 year old female presented to ED 10/19 after found to be hypoxic with sats in 80% range at routine cardiologist appointment. She was admitted for presumed COPD exacerbation, which worsened 10/20 requiring BiPAP. P CCM was consulted for further evaluation.  SIGNIFICANT EVENTS  10/19 > admitted 10/20> Transferred to ICU from SDU on BiPap for acute hypoxemic resp failure   STUDIES:  12/12/12 PFT>  FEV1 1.41 (53%) ratio 54 with no significant reversibility and DLCO 47% corrects to 50 10/20 Echo >>>  SUBJECTIVE:  Pt reports "feeling better" on BiPap. Requesting to sit up on side of bed.   VITAL SIGNS: Temp:  [97.2 F (36.2 C)-98.9 F (37.2 C)] 98.3 F (36.8 C) (10/21 0834) Pulse Rate:  [82-133] 133 (10/21 0800) Resp:  [13-32] 24 (10/21 0800) BP: (81-155)/(36-90) 155/77 mmHg (10/21 0800) SpO2:  [91 %-100 %] 95 % (10/21 0800) FiO2 (%):  [40 %] 40 % (10/21 0813) Weight:  [96.7 kg (213 lb 3 oz)] 96.7 kg (213 lb 3 oz) (10/20 1645)  Intake/Output Summary (Last 24 hours) at 01/09/15 1031 Last data filed at 01/09/15 1000  Gross per 24 hour  Intake    970 ml  Output   1925 ml  Net   -955 ml   PHYSICAL EXAMINATION: General:  Morbidly obese female in NAD on BiPAP Neuro: Alert & Oriented x4, no focal deficits  HEENT:  NCAT, PERRL, JVD noted + element of upper airway wheeze Cardiovascular:  Tachy, regular, no MRG Lungs:  Significant expiratory wheeze bilaterally  Abdomen:  Soft, nontender, nondistended Musculoskeletal:  No acute deformities or ROM limitations. Skin:  Grossly intact   Recent Labs Lab 01/07/15 1539 01/07/15 2336 01/09/15 0253  NA 141  --  141  K 3.8  --  3.7  CL 100*  --  101  CO2 25  --  28  BUN 8  --  13  CREATININE 0.81 0.75 0.68  GLUCOSE 184*  --  144*     Recent Labs Lab 01/07/15 1539 01/07/15 2336 01/09/15 0253  HGB 11.3* 10.3* 10.5*  HCT 37.9 33.9* 35.4*  WBC 9.9 6.5 23.6*  PLT 284 232 324   Dg Chest Port 1 View  01/08/2015  CLINICAL DATA:  SOB,HX COPD EXAM: PORTABLE CHEST 1 VIEW COMPARISON:  01/07/2015 FINDINGS: Midline trachea. Normal heart size. Atherosclerosis in the transverse aorta. No right and no definite left pleural effusion. No pneumothorax. Increased density projecting over the lung bases, greater on the left. Favored to be due to overlying breast tissues. Interstitial thickening is lower lobe predominant. No lobar consolidation. IMPRESSION: No acute cardiopulmonary disease. degraded evaluation of the inferior chest, secondary to overlying breast tissues. Peribronchial thickening which may relate to chronic bronchitis or smoking. Atherosclerosis. Electronically Signed   By: Jeronimo Greaves M.D.   On: 01/08/2015 11:14   Dg Chest Port 1 View  01/07/2015  CLINICAL DATA:  Productive cough for 3 weeks.  Hypoxia. EXAM: PORTABLE CHEST 1 VIEW COMPARISON:  Jul 25, 2014. FINDINGS: The heart size and mediastinal contours are within normal limits. No pneumothorax or pleural effusion is noted. Stable bibasilar interstitial densities are noted most consistent with scarring. No acute pulmonary disease is noted. The visualized skeletal structures are unremarkable. IMPRESSION: No acute cardiopulmonary abnormality seen. Electronically Signed   By: Lupita Raider,  M.D.   On: 01/07/2015 16:32    ASSESSMENT / PLAN:   PULMONARY: A: COPD with acute exacerbation plus element of LPR Acute hypoxemic respiratory failure Tobacco abuse disorder  Discussion: She presents as a COPD exacerbation, however, cannot rule out pneumonia in setting of fevers and productive cough. Also concern for acute CHF in setting of extensive cardiac history, however chest x-ray and BNP would not support that. Echocardiogram is pending. ABG demonstrates hypoxemia and when  consider with tachycardia, pulmonary embolism is in the differential but less likely when significant expiratory wheeze is considered.   P: Continue BiPAP 10/21- goal 4 hours on 1 hour off  Scheduled BDs  Solumedrol  Repeat chest x-ray in AM See ID  Maximize GERD rx Flutter when off BIPAP   CARDIOVASCULAR A:  Suspect acute CHF exacerbation Tachycardia, sinus  H/o CAD s/p 4 stents  P:  Tele Follow Echo Lasix as tolerated Strict I&O Keep negative balance Change home coreg to bisoprolol  Lasix   RENAL A:   Mildly elevated AG acidosis >lactic (cleared)  P:   Repeat Bmet   GASTROINTESTINAL A:   GERD PUD  P:   Protonix to BID-->increased to 80 while exacerbating  NPO on BiPAP  HEMATOLOGIC A:   Anemia, baseline Hgb 11 H/o breast Ca s/p lumpectomy L breast  P:  Repeat CBC VTE dose lovenox SCD Cont home plavix, asa 81  INFECTIOUS A:   ? CAP COPD Exacerbation  P:   BCx2 10/19 > NG 24 hrs>> Sputum 10/20 > Abx: levofloxacin, start date 10/19 >>> PCT algo  ENDOCRINE A:   Hyperglycemia on steroids  P:   CBG monitoring and SSI  NEUROLOGIC A:   Anxiety   P:   RASS goal: 0 Low dose ativan for BiPAP compliance Hold neurontin   FAMILY  - Updates:   - Inter-disciplinary family meet or Palliative Care meeting due by:  10/27 >  Today's Summary:  Pt is 64 yof transferred to ICU from SDU on 10/20 for acute hypoxemic respiratory failure in the setting of COPD exacerbation and ?CAP vs CHF. Pt remains on Bipap. She states she feels better, although still has significant exp wheeze bilaterally. Plan for today is to continue Bipap, steroids, scheduled BDs, atbx. Still pending echo to r/o CHF, continue lasix therapy. Add metoprolol for ST.

## 2015-01-09 NOTE — Progress Notes (Signed)
Inpatient Diabetes Program Recommendations  AACE/ADA: New Consensus Statement on Inpatient Glycemic Control (2015)  Target Ranges:  Prepandial:   less than 140 mg/dL      Peak postprandial:   less than 180 mg/dL (1-2 hours)      Critically ill patients:  140 - 180 mg/dL   Review of Glycemic Control:  Results for Patricia Wall, Patricia Wall (MRN [id number]) as of 01/09/2015 09:21  Ref. Range 01/08/2015 15:55 01/08/2015 19:37 01/08/2015 23:45 01/09/2015 03:40 01/09/2015 07:58  Glucose-Capillary Latest Ref Range: 65-99 mg/dL 469118 (H) 629201 (H) 528199 (H) 163 (H) 228 (H)   Blood sugars greater than goal with Solumedrol 80 mg IV q 6 hours.  May consider adding low dose basal insulin such as Levemir 15 units daily.  Thanks, Beryl MeagerJenny Tieasha Larsen, RN, BC-ADM Inpatient Diabetes Coordinator Pager [phone] (8a-5p)

## 2015-01-09 NOTE — Progress Notes (Signed)
  Echocardiogram 2D Echocardiogram has been performed.  Patricia Wall 01/09/2015, 12:20 PM

## 2015-01-10 LAB — CBC WITH DIFFERENTIAL/PLATELET
BASOS ABS: 0 10*3/uL (ref 0.0–0.1)
BASOS PCT: 0 %
EOS ABS: 0 10*3/uL (ref 0.0–0.7)
EOS PCT: 0 %
HEMATOCRIT: 36.5 % (ref 36.0–46.0)
Hemoglobin: 11.1 g/dL — ABNORMAL LOW (ref 12.0–15.0)
Lymphocytes Relative: 6 %
Lymphs Abs: 1.2 10*3/uL (ref 0.7–4.0)
MCH: 25.3 pg — ABNORMAL LOW (ref 26.0–34.0)
MCHC: 30.4 g/dL (ref 30.0–36.0)
MCV: 83.1 fL (ref 78.0–100.0)
MONO ABS: 0.7 10*3/uL (ref 0.1–1.0)
Monocytes Relative: 3 %
NEUTROS ABS: 17.4 10*3/uL — AB (ref 1.7–7.7)
Neutrophils Relative %: 91 %
PLATELETS: 334 10*3/uL (ref 150–400)
RBC: 4.39 MIL/uL (ref 3.87–5.11)
RDW: 17.5 % — AB (ref 11.5–15.5)
WBC: 19.3 10*3/uL — ABNORMAL HIGH (ref 4.0–10.5)

## 2015-01-10 LAB — BASIC METABOLIC PANEL
ANION GAP: 11 (ref 5–15)
BUN: 19 mg/dL (ref 6–20)
CALCIUM: 8.4 mg/dL — AB (ref 8.9–10.3)
CO2: 29 mmol/L (ref 22–32)
CREATININE: 0.78 mg/dL (ref 0.44–1.00)
Chloride: 96 mmol/L — ABNORMAL LOW (ref 101–111)
GFR calc Af Amer: >60 mL/min (ref >60)
GLUCOSE: 176 mg/dL — AB (ref 65–99)
Potassium: 3.4 mmol/L — ABNORMAL LOW (ref 3.5–5.1)
Sodium: 136 mmol/L (ref 135–145)

## 2015-01-10 LAB — PHOSPHORUS: PHOSPHORUS: 3.7 mg/dL (ref 2.5–4.6)

## 2015-01-10 LAB — MAGNESIUM: MAGNESIUM: 2.4 mg/dL (ref 1.7–2.4)

## 2015-01-10 LAB — GLUCOSE, CAPILLARY
GLUCOSE-CAPILLARY: 307 mg/dL — AB (ref 65–99)
Glucose-Capillary: 342 mg/dL — ABNORMAL HIGH (ref 65–99)
Glucose-Capillary: 336 mg/dL — ABNORMAL HIGH (ref 65–99)
Glucose-Capillary: 362 mg/dL — ABNORMAL HIGH (ref 65–99)

## 2015-01-10 MED ORDER — POTASSIUM CHLORIDE CRYS ER 20 MEQ PO TBCR
40.0000 meq | EXTENDED_RELEASE_TABLET | Freq: Once | ORAL | Status: AC
  Administered 2015-01-10: 40 meq via ORAL
  Filled 2015-01-10: qty 2

## 2015-01-10 MED ORDER — MAGNESIUM SULFATE 2 GM/50ML IV SOLN
2.0000 g | Freq: Once | INTRAVENOUS | Status: AC
  Administered 2015-01-10: 2 g via INTRAVENOUS
  Filled 2015-01-10: qty 50

## 2015-01-10 NOTE — Progress Notes (Signed)
Pt taken off bipap and placed on 5L [place]. RT will continue to monitor.  

## 2015-01-10 NOTE — Progress Notes (Signed)
Ativan and versed gave no relief to anxiety/agitation with BiPAP on. Pt continued to try and remove BiPAP. Pt placed on 5L [place]. RN will continue to monitor pt respiratory status closely.

## 2015-01-10 NOTE — Progress Notes (Signed)
Pt off Bipap when I entered room. Pt only wore mask until about 2am this morning. Pt tolerating [place] well. Vitals WNL

## 2015-01-10 NOTE — Progress Notes (Signed)
Elink RN updated about pt BMP and Mg results.

## 2015-01-10 NOTE — Progress Notes (Signed)
Name: Patricia RedheadJoan B Wall MRN: [id number] DOB: 1951-01-16    ADMISSION DATE:  01/07/2015 CONSULTATION DATE:  10/20  REFERRING MD :  Rito EhrlichKrishnan  CHIEF COMPLAINT:  SOB  BRIEF PATIENT DESCRIPTION: 64 year old female presented to ED 10/19 after found to be hypoxic with sats in 80% range at routine cardiologist appointment. She was admitted for presumed COPD exacerbation, which worsened 10/20 requiring BiPAP. P CCM was consulted for further evaluation.  SIGNIFICANT EVENTS  10/19 > admitted 10/20> Transferred to ICU from SDU on BiPap for acute hypoxemic resp failure   STUDIES:  12/12/12 PFT>  FEV1 1.41 (53%) ratio 54 with no significant reversibility and DLCO 47% corrects to 50 10/20 Echo >>> 01/09/15: Pt reports "feeling better" on BiPap. Requesting to sit up on side of bed.    SUBJECTIVE/OVERNIGHT/INTERVAL HX 01/10/15: Had some leg cramps yesterday and was given magnesium replacement for mag of 1.5. Overnight took some BiPAP but did have some agitation with it. Currently she is sitting in the chair eating breakfast. Says she's feeling better. Rates  subjective dyspnea score at as 4 out of 10 and close to baseline or at baseline.  VITAL SIGNS: Temp:  [97 F (36.1 C)-98.9 F (37.2 C)] 98.9 F (37.2 C) (10/22 0826) Pulse Rate:  [91-126] 115 (10/22 0900) Resp:  [12-30] 22 (10/22 0900) BP: (95-136)/(38-101) 121/65 mmHg (10/22 0748) SpO2:  [85 %-99 %] 88 % (10/22 0900) FiO2 (%):  [40 %] 40 % (10/22 0748)  Intake/Output Summary (Last 24 hours) at 01/10/15 1045 Last data filed at 01/10/15 0800  Gross per 24 hour  Intake    660 ml  Output   1925 ml  Net  -1265 ml   PHYSICAL EXAMINATION: General:  Morbidly obese female in NAD on BiPAP Neuro: Alert & Oriented x4, no focal deficits  HEENT:  NCAT, PERRL, JVD noted + element of upper airway wheeze Cardiovascular:  Tachy, regular, no MRG Lungs:  Bilateral expiratory wheezing present [? Baseline finding but not reported 12/30/13 and 12/09/14  office note] does have pursed lip breathing. Barrel chest. Overall air entry is diminished Abdomen:  Soft, nontender, nondistended Musculoskeletal:  No acute deformities or ROM limitations. Skin:  Grossly intact  PULMONARY  Recent Labs Lab 01/08/15 1006 01/08/15 1515  PHART 7.420 7.414  PCO2ART 36.9 37.1  PO2ART 57.0* 93.6  HCO3 24.0 23.3  TCO2 25 24.4  O2SAT 91.0 97.0    CBC  Recent Labs Lab 01/07/15 2336 01/09/15 0253 01/10/15 0455  HGB 10.3* 10.5* 11.1*  HCT 33.9* 35.4* 36.5  WBC 6.5 23.6* 19.3*  PLT 232 324 334    COAGULATION No results for input(s): INR in the last 168 hours.  CARDIAC   Recent Labs Lab 01/07/15 2336 01/08/15 0320  TROPONINI <0.03 <0.03   No results for input(s): PROBNP in the last 168 hours.   CHEMISTRY  Recent Labs Lab 01/07/15 1539 01/07/15 2336 01/09/15 0253 01/09/15 2228 01/10/15 0455  NA 141  --  141 133* 136  K 3.8  --  3.7 4.1 3.4*  CL 100*  --  101 95* 96*  CO2 25  --  28 28 29   GLUCOSE 184*  --  144* 291* 176*  BUN 8  --  13 19 19   CREATININE 0.81 0.75 0.68 0.81 0.78  CALCIUM 9.7  --  9.0 8.4* 8.4*  MG  --   --   --  1.5* 2.4  PHOS  --  1.9* 4.0  --  3.7   Estimated Creatinine  Clearance: 81.1 mL/min (by C-G formula based on Cr of 0.78).   LIVER No results for input(s): AST, ALT, ALKPHOS, BILITOT, PROT, ALBUMIN, INR in the last 168 hours.   INFECTIOUS  Recent Labs Lab 01/07/15 1600 01/07/15 1910 01/09/15 0253  LATICACIDVEN 2.50* 1.04  --   PROCALCITON  --   --  <0.10     ENDOCRINE CBG (last 3)   Recent Labs  01/09/15 1602 01/09/15 1923 01/10/15 0824  GLUCAP 259* 322* 307*         IMAGING x48h  - image(s) personally visualized  -   highlighted in bold No results found.      ASSESSMENT / PLAN:   PULMONARY: A: COPD with acute exacerbation plus element of LPR Acute hypoxemic respiratory failure Tobacco abuse disorder  Discussion: She presents as a COPD exacerbation, however,  cannot rule out pneumonia in setting of fevers and productive cough. Also concern for acute CHF in setting of extensive cardiac history, however chest x-ray and BNP would not support that. Echocardiogram is pending. ABG demonstrates hypoxemia and when consider with tachycardia, pulmonary embolism is in the differential but less likely when significant expiratory wheeze is considered.  01/10/15:  Improved subjectively but still has wheezing. Office notes revealed that at baseline she does not have wheezing  P:  Morphine for dyspnea which is helping Continue BiPAdaily at bedtime plus daytime when necessary\ Scheduled BDs  Solumedrol  Repeat chest x-ray in AM See ID  Maximize GERD rx Flutter when off BIPAP   CARDIOVASCULAR   Recent Labs Lab 01/07/15 2336 01/08/15 0320  TROPONINI <0.03 <0.03    A:  Suspect acute CHF exacerbation Tachycardia, sinus  H/o CAD s/p 4 stents   ECHO  PASP 38 wutg grade 1 diast dsfn P:    Tele Follow Echo Lasix as tolerated Strict I&O Keep negative balance Change home coreg to bisoprolol    RENAL A:   Mildly elevated AG acidosis >lactic (cleared)   Low Mag repleted K is low - hypokalemia  P:    replete potassium Repeat Bmet   GASTROINTESTINAL A:   GERD PUD  P:   Protonix to BID-->increased to 80 while exacerbating  NPO on BiPAP  HEMATOLOGIC A:   Anemia, baseline Hgb 11 H/o breast Ca s/p lumpectomy L breast  P:  Repeat CBC VTE dose lovenox SCD Cont home plavix, asa 81  INFECTIOUS  Recent Labs Lab 01/09/15 0253  PROCALCITON <0.10    A:   ? CAP COPD Exacerbation  P:   BCx2 10/19 > NG 24 hrs>> Sputum 10/20 > Abx: levofloxacin, start date 10/19 >>> Will limit to 5 days PCT algo  ENDOCRINE A:   Hyperglycemia on steroids  P:   CBG monitoring and SSI  NEUROLOGIC A:   Anxiety   P:   RASS goal: 0 Low dose ativan for BiPAP compliance Hold neurontin   FAMILY  - Updates:   - Inter-disciplinary family  meet or Palliative Care meeting due by:  10/27 >  Today's Summary:   improved. Given continued wheezing will keep in the ICU but okay for stepdown ICU. Hospitalist service to assume primary care tomorrow 01/11/2015. Overnight BiPAP plus daytime when necessary   Dr. Kalman Shan, M.D., [facility].C.P Pulmonary and Critical Care Medicine Staff Physician [facility] Pager: [phone], If no answer or between  15:00h - 7:00h: call [phone]  01/10/2015 11:10 AM

## 2015-01-10 NOTE — Progress Notes (Signed)
eLink Physician-Brief Progress Note Patient Name: Patricia Wall DOB: Jan 22, 1951 MRN: [id number]   Date of Service  01/10/2015  HPI/Events of Note  K+ = 4.1, Mg++ = 1.5 and Creatinine = 0.81.  eICU Interventions  Will replete Mg++.     Intervention Category Intermediate Interventions: Electrolyte abnormality - evaluation and management  [name] 01/10/2015, 1:00 AM

## 2015-01-11 ENCOUNTER — Inpatient Hospital Stay (HOSPITAL_COMMUNITY): Payer: Medicare Other

## 2015-01-11 DIAGNOSIS — I5031 Acute diastolic (congestive) heart failure: Secondary | ICD-10-CM

## 2015-01-11 LAB — GLUCOSE, CAPILLARY
GLUCOSE-CAPILLARY: 261 mg/dL — AB (ref 65–99)
GLUCOSE-CAPILLARY: 334 mg/dL — AB (ref 65–99)
Glucose-Capillary: 158 mg/dL — ABNORMAL HIGH (ref 65–99)
Glucose-Capillary: 199 mg/dL — ABNORMAL HIGH (ref 65–99)
Glucose-Capillary: 277 mg/dL — ABNORMAL HIGH (ref 65–99)
Glucose-Capillary: 308 mg/dL — ABNORMAL HIGH (ref 65–99)
Glucose-Capillary: 318 mg/dL — ABNORMAL HIGH (ref 65–99)

## 2015-01-11 LAB — CBC WITH DIFFERENTIAL/PLATELET
BASOS ABS: 0 10*3/uL (ref 0.0–0.1)
BASOS PCT: 0 %
Eosinophils Absolute: 0 10*3/uL (ref 0.0–0.7)
Eosinophils Relative: 0 %
HEMATOCRIT: 35.1 % — AB (ref 36.0–46.0)
HEMOGLOBIN: 10.3 g/dL — AB (ref 12.0–15.0)
LYMPHS PCT: 5 %
Lymphs Abs: 0.7 10*3/uL (ref 0.7–4.0)
MCH: 24.3 pg — ABNORMAL LOW (ref 26.0–34.0)
MCHC: 29.3 g/dL — ABNORMAL LOW (ref 30.0–36.0)
MCV: 83 fL (ref 78.0–100.0)
MONOS PCT: 3 %
Monocytes Absolute: 0.4 10*3/uL (ref 0.1–1.0)
NEUTROS ABS: 12.4 10*3/uL — AB (ref 1.7–7.7)
NEUTROS PCT: 92 %
Platelets: 271 10*3/uL (ref 150–400)
RBC: 4.23 MIL/uL (ref 3.87–5.11)
RDW: 17.1 % — ABNORMAL HIGH (ref 11.5–15.5)
WBC: 13.5 10*3/uL — ABNORMAL HIGH (ref 4.0–10.5)

## 2015-01-11 LAB — PROCALCITONIN

## 2015-01-11 LAB — MAGNESIUM: Magnesium: 2.4 mg/dL (ref 1.7–2.4)

## 2015-01-11 LAB — PHOSPHORUS: PHOSPHORUS: 2.5 mg/dL (ref 2.5–4.6)

## 2015-01-11 MED ORDER — FUROSEMIDE 10 MG/ML IJ SOLN
40.0000 mg | Freq: Once | INTRAMUSCULAR | Status: AC
  Administered 2015-01-11: 40 mg via INTRAVENOUS

## 2015-01-11 MED ORDER — METFORMIN HCL 500 MG PO TABS
1000.0000 mg | ORAL_TABLET | Freq: Two times a day (BID) | ORAL | Status: DC
  Administered 2015-01-11 – 2015-01-12 (×3): 1000 mg via ORAL
  Filled 2015-01-11 (×5): qty 2

## 2015-01-11 MED ORDER — ALBUTEROL SULFATE (2.5 MG/3ML) 0.083% IN NEBU: 3.0000 mL | INHALATION_SOLUTION | RESPIRATORY_TRACT | Status: DC | PRN

## 2015-01-11 MED ORDER — TIOTROPIUM BROMIDE MONOHYDRATE 18 MCG IN CAPS
18.0000 ug | ORAL_CAPSULE | Freq: Every day | RESPIRATORY_TRACT | Status: DC
  Filled 2015-01-11: qty 5

## 2015-01-11 MED ORDER — GABAPENTIN 300 MG PO CAPS
600.0000 mg | ORAL_CAPSULE | Freq: Two times a day (BID) | ORAL | Status: DC
  Administered 2015-01-11 – 2015-01-13 (×5): 600 mg via ORAL
  Filled 2015-01-11 (×7): qty 2

## 2015-01-11 MED ORDER — ASPIRIN 81 MG PO CHEW
81.0000 mg | CHEWABLE_TABLET | Freq: Every day | ORAL | Status: DC
  Administered 2015-01-11 – 2015-01-12 (×2): 81 mg via ORAL
  Filled 2015-01-11 (×2): qty 1

## 2015-01-11 MED ORDER — DM-GUAIFENESIN ER 30-600 MG PO TB12
1.0000 | ORAL_TABLET | Freq: Two times a day (BID) | ORAL | Status: DC
  Administered 2015-01-11 – 2015-01-13 (×5): 1 via ORAL
  Filled 2015-01-11 (×7): qty 1

## 2015-01-11 MED ORDER — NIACIN ER (ANTIHYPERLIPIDEMIC) 1000 MG PO TBCR: 2000.0000 mg | EXTENDED_RELEASE_TABLET | Freq: Every day | ORAL | Status: DC

## 2015-01-11 MED ORDER — CALCIUM CARBONATE 1250 (500 CA) MG PO TABS
1.0000 | ORAL_TABLET | Freq: Every day | ORAL | Status: DC
  Administered 2015-01-11 – 2015-01-13 (×3): 500 mg via ORAL
  Filled 2015-01-11 (×3): qty 1

## 2015-01-11 NOTE — Progress Notes (Signed)
Patient to transfer to 5W24 report given to receiving nurse, all questions answered at this time.  Pt. VSS with no s/s of distress noted.  Patient stable at transfer.

## 2015-01-11 NOTE — Progress Notes (Signed)
[facility] PROGRESS NOTE    Progress Note   Patricia RedheadJoan B Grandt WGN:[id number] DOB: 1950-05-18 DOA: 01/07/2015 PCP: Willow OraANDY,CAMILLE L, MD   Brief Narrative:   Patricia Wall is an 64 y.o. female with past medical history of COPD not oxygen dependent 12/12/12 PFT> FEV1 1.41 (53%) ratio 54 with no significant reversibility and DLCO 47 % who came into the hospital for shortness of breath admitted for COPD exacerbation on 01/08/2015 which she worsen and required BiPAP. In the ICU for days when she was improved she was transferred to the floor.  Assessment/Plan:   Acute respiratory failure with hypoxia (HCC) due to COPD exacerbation and possibly mild acute diastolic heart failure - Admitted on 01/07/2015 cancer to step down requiring BiPAP for respiratory failure.  - Started on IV Solu-Medrol IV antibiotics, still requiring 4 L of oxygen to maintain saturations greater than 90%. - Her BNP is 52, she is obese and elderly to probably her threshold is probably lower, we'll give a single dose of Lasix. - Going through Dr. Sherene SiresWert note patient at baseline does not wheeze. - Chest x-ray seems to have some mild pleural effusion and maybe a little bit of vascular congestion. - I cannot appreciate any JVD on body habitus she does have crackles on her right lower lung. - Resume Spiriva.  - transfer her to med-surg.  Essential hypertension - I agree with her her Coreg was stopped and changed to bisoprolol due to the nonselective beta adrenergic properties of Coreg.  High anion gap lactic acidosis: Likely due to hypoxia now cleared.  Electrolytes imbalance hyperkalemia/ hypomagnesemia: These were repleted stable.  GERD Cont PPI.  Irritable bowel syndrome Cont home meds.  Uncontrolled diabetes mellitus type 2 any complications and not on insulin: - Her metformin was held due to her hypoxia and admission to the hospital. Her creatinine is stable, lactic acidosis resolved. We'll resume  metformin.     DVT Prophylaxis - Lovenox ordered.  Family Communication: none Disposition Plan: Home when stable. Code Status:     Code Status Orders        Start     Ordered   01/07/15 2212  Full code   Continuous     01/07/15 2211        IV Access:    Peripheral IV   Procedures and diagnostic studies:   No results found.   Medical Consultants:    None.  Anti-Infectives:   Anti-infectives    Start     Dose/Rate Route Frequency Ordered Stop   01/08/15 1600  levofloxacin (LEVAQUIN) IVPB 750 mg     750 mg 100 mL/hr over 90 Minutes Intravenous Every 24 hours 01/07/15 2211     01/07/15 1645  levofloxacin (LEVAQUIN) IVPB 750 mg     750 mg 100 mL/hr over 90 Minutes Intravenous  Once 01/07/15 1641 01/07/15 1916      Subjective:    Patricia RedheadJoan B Steffensmeier She relates her breathing is better.   Objective:    Filed Vitals:   01/10/15 1806 01/10/15 2026 01/10/15 2323 01/11/15 0417  BP: 120/66 126/73 137/73 132/73  Pulse:  96 93 95  Temp:  98.4 F (36.9 C) 97.9 F (36.6 C) 98 F (36.7 C)  TempSrc:  Oral Oral Oral  Resp: 27 26 22 24   Height:      Weight:      SpO2:  94% 96% 95%    Intake/Output Summary (Last 24 hours) at 01/11/15 0732 Last data filed at 01/11/15 0410  Gross per 24 hour  Intake    390 ml  Output   1125 ml  Net   -735 ml   Filed Weights   01/07/15 2206 01/08/15 1645  Weight: 96.616 kg (213 lb) 96.7 kg (213 lb 3 oz)    Exam: Gen:  NAD Cardiovascular:  RRR, No M/R/G, negative JVD Chest and lungs:  Good air movement but wheezing bilaterally, some appreciated right lung crackles.  Abdomen:  Abdomen soft, NT/ND, + BS Extremities:  No C/E/C   Data Reviewed:    Labs: Basic Metabolic Panel:  Recent Labs Lab 01/07/15 1539 01/07/15 2336 01/09/15 0253 01/09/15 2228 01/10/15 0455 01/11/15 0408  NA 141  --  141 133* 136  --   K 3.8  --  3.7 4.1 3.4*  --   CL 100*  --  101 95* 96*  --   CO2 25  --  --   GLUCOSE  184*  --  144* 291* 176*  --   BUN 8  --  --   CREATININE 0.81 0.75 0.68 0.81 0.78  --   CALCIUM 9.7  --  9.0 8.4* 8.4*  --   MG  --   --   --  1.5* 2.4 2.4  PHOS  --  1.9* 4.0  --  3.7 2.5   GFR Estimated Creatinine Clearance: 81.1 mL/min (by C-G formula based on Cr of 0.78). Liver Function Tests: No results for input(s): AST, ALT, ALKPHOS, BILITOT, PROT, ALBUMIN in the last 168 hours. No results for input(s): LIPASE, AMYLASE in the last 168 hours. No results for input(s): AMMONIA in the last 168 hours. Coagulation profile No results for input(s): INR, PROTIME in the last 168 hours.  CBC:  Recent Labs Lab 01/07/15 1539 01/07/15 2336 01/09/15 0253 01/10/15 0455 01/11/15 0408  WBC 9.9 6.5 23.6* 19.3* 13.5*  NEUTROABS 6.3  --   --  17.4* 12.4*  HGB 11.3* 10.3* 10.5* 11.1* 10.3*  HCT 37.9 33.9* 35.4* 36.5 35.1*  MCV 84.6 83.5 83.9 83.1 83.0  PLT 284 232 324 334 271   Cardiac Enzymes:  Recent Labs Lab 01/07/15 2336 01/08/15 0320  TROPONINI <0.03 <0.03   BNP (last 3 results) No results for input(s): PROBNP in the last 8760 hours. CBG:  Recent Labs Lab 01/10/15 1204 01/10/15 1620 01/10/15 2026 01/10/15 2322 01/11/15 0416  GLUCAP 342* 336* 362* 261* 199*   D-Dimer: No results for input(s): DDIMER in the last 72 hours. Hgb A1c: No results for input(s): HGBA1C in the last 72 hours. Lipid Profile: No results for input(s): CHOL, HDL, LDLCALC, TRIG, CHOLHDL, LDLDIRECT in the last 72 hours. Thyroid function studies: No results for input(s): TSH, T4TOTAL, T3FREE, THYROIDAB in the last 72 hours.  Invalid input(s): FREET3 Anemia work up: No results for input(s): VITAMINB12, FOLATE, FERRITIN, TIBC, IRON, RETICCTPCT in the last 72 hours. Sepsis Labs:  Recent Labs Lab 01/07/15 1600 01/07/15 1910 01/07/15 2336 01/09/15 0253 01/10/15 0455 01/11/15 0408  PROCALCITON  --   --   --  <0.10  --   --   WBC  --   --  6.5 23.6* 19.3* 13.5*  LATICACIDVEN 2.50*  1.04  --   --   --   --    Microbiology Recent Results (from the past 240 hour(s))  Blood culture (routine x 2)     Status: None (Preliminary result)   Collection Time: 01/07/15  4:56 PM  Result Value Ref Range  Status   Specimen Description BLOOD RIGHT FOREARM  Final   Special Requests BOTTLES DRAWN AEROBIC AND ANAEROBIC 5CC  Final   Culture NO GROWTH 3 DAYS  Final   Report Status PENDING  Incomplete  Blood culture (routine x 2)     Status: None (Preliminary result)   Collection Time: 01/07/15  4:59 PM  Result Value Ref Range Status   Specimen Description BLOOD LEFT FOREARM  Final   Special Requests BOTTLES DRAWN AEROBIC AND ANAEROBIC 5CC  Final   Culture NO GROWTH 3 DAYS  Final   Report Status PENDING  Incomplete  MRSA PCR Screening     Status: Abnormal   Collection Time: 01/07/15 10:36 PM  Result Value Ref Range Status   MRSA by PCR POSITIVE (A) NEGATIVE Final    Comment:        The GeneXpert MRSA Assay (FDA approved for NASAL specimens only), is one component of a comprehensive MRSA colonization surveillance program. It is not intended to diagnose MRSA infection nor to guide or monitor treatment for MRSA infections. RESULT CALLED TO, READ BACK BY AND VERIFIED WITH: MICHELLE  01/08/15 MKELLY      Medications:   . antiseptic oral rinse  7 mL Mouth Rinse q12n4p  . aspirin  81 mg Oral QHS  . atorvastatin  40 mg Oral QHS  . benzonatate  100 mg Oral TID  . bisoprolol  10 mg Oral Daily  . budesonide (PULMICORT) nebulizer solution  0.5 mg Nebulization BID  . calcium carbonate  1 tablet Oral Q breakfast  . chlorhexidine  15 mL Mouth Rinse BID  . Chlorhexidine Gluconate Cloth  6 each Topical Q0600  . chlorpheniramine-HYDROcodone  5 mL Oral Q12H  . clopidogrel  75 mg Oral Daily  . DULoxetine  60 mg Oral BID  . enoxaparin (LOVENOX) injection  50 mg Subcutaneous QHS  . famotidine  40 mg Oral QHS  . insulin aspart  0-20 Units Subcutaneous Q4H  . ipratropium  0.5 mg  Nebulization Q12H  . letrozole  2.5 mg Oral Daily  . levalbuterol  0.63 mg Nebulization Q4H  . levofloxacin (LEVAQUIN) IV  750 mg Intravenous Q24H  . methylPREDNISolone (SOLU-MEDROL) injection  80 mg Intravenous 4 times per day  . multivitamin with minerals  1 tablet Oral Daily  . mupirocin ointment  1 application Nasal BID  . niacin  2,000 mg Oral QHS  . pantoprazole  80 mg Oral BID   Continuous Infusions:   Time spent: 25 min   LOS: 4 days   Marinda Elk  [facility] Pager [phone]  *Please refer to [email or web address], password [email or web address] to get updated schedule on who will round on this patient, as hospitalists switch teams weekly. If 7PM-7AM, please contact night-coverage at [email or web address], password [email or web address] for any overnight needs.  01/11/2015, 7:32 AM

## 2015-01-12 DIAGNOSIS — J9601 Acute respiratory failure with hypoxia: Secondary | ICD-10-CM

## 2015-01-12 LAB — CULTURE, BLOOD (ROUTINE X 2)
CULTURE: NO GROWTH
Culture: NO GROWTH

## 2015-01-12 LAB — BASIC METABOLIC PANEL
Anion gap: 8 (ref 5–15)
BUN: 24 mg/dL — AB (ref 6–20)
CALCIUM: 8.8 mg/dL — AB (ref 8.9–10.3)
CHLORIDE: 98 mmol/L — AB (ref 101–111)
CO2: 34 mmol/L — ABNORMAL HIGH (ref 22–32)
CREATININE: 0.86 mg/dL (ref 0.44–1.00)
Glucose, Bld: 227 mg/dL — ABNORMAL HIGH (ref 65–99)
Potassium: 3.9 mmol/L (ref 3.5–5.1)
SODIUM: 140 mmol/L (ref 135–145)

## 2015-01-12 LAB — MAGNESIUM: Magnesium: 2.1 mg/dL (ref 1.7–2.4)

## 2015-01-12 LAB — CBC WITH DIFFERENTIAL/PLATELET
BASOS ABS: 0 10*3/uL (ref 0.0–0.1)
BASOS PCT: 0 %
EOS PCT: 0 %
Eosinophils Absolute: 0 10*3/uL (ref 0.0–0.7)
HCT: 36.2 % (ref 36.0–46.0)
Hemoglobin: 10.7 g/dL — ABNORMAL LOW (ref 12.0–15.0)
LYMPHS PCT: 7 %
Lymphs Abs: 0.6 10*3/uL — ABNORMAL LOW (ref 0.7–4.0)
MCH: 24.4 pg — ABNORMAL LOW (ref 26.0–34.0)
MCHC: 29.6 g/dL — ABNORMAL LOW (ref 30.0–36.0)
MCV: 82.5 fL (ref 78.0–100.0)
MONO ABS: 0.1 10*3/uL (ref 0.1–1.0)
Monocytes Relative: 2 %
NEUTROS ABS: 8.6 10*3/uL — AB (ref 1.7–7.7)
Neutrophils Relative %: 91 %
PLATELETS: 229 10*3/uL (ref 150–400)
RBC: 4.39 MIL/uL (ref 3.87–5.11)
RDW: 16.6 % — AB (ref 11.5–15.5)
WBC: 9.4 10*3/uL (ref 4.0–10.5)

## 2015-01-12 LAB — GLUCOSE, CAPILLARY
GLUCOSE-CAPILLARY: 179 mg/dL — AB (ref 65–99)
GLUCOSE-CAPILLARY: 249 mg/dL — AB (ref 65–99)
GLUCOSE-CAPILLARY: 330 mg/dL — AB (ref 65–99)
Glucose-Capillary: 286 mg/dL — ABNORMAL HIGH (ref 65–99)
Glucose-Capillary: 267 mg/dL — ABNORMAL HIGH (ref 65–99)

## 2015-01-12 LAB — D-DIMER, QUANTITATIVE: D-Dimer, Quant: 0.39 ug/mL-FEU (ref 0.00–0.48)

## 2015-01-12 LAB — PHOSPHORUS: PHOSPHORUS: 3.2 mg/dL (ref 2.5–4.6)

## 2015-01-12 MED ORDER — SODIUM CHLORIDE 0.9 % IV SOLN
INTRAVENOUS | Status: AC
  Administered 2015-01-12 (×2): via INTRAVENOUS

## 2015-01-12 MED ORDER — METHYLPREDNISOLONE SODIUM SUCC 40 MG IJ SOLR
40.0000 mg | Freq: Four times a day (QID) | INTRAMUSCULAR | Status: DC
  Administered 2015-01-12 – 2015-01-13 (×3): 40 mg via INTRAVENOUS
  Filled 2015-01-12 (×3): qty 1

## 2015-01-12 NOTE — Progress Notes (Signed)
[facility] PROGRESS NOTE    Progress Note   Patricia Wall ZOX:[id number] DOB: 20-Jan-1951 DOA: 01/07/2015 PCP: [name], MD   Brief Narrative:   Patricia Wall is an 64 y.o. female with past medical history of COPD not oxygen dependent 12/12/12 PFT> FEV1 1.41 (53%) ratio 54 with no significant reversibility and DLCO 47 % who came into the hospital for shortness of breath admitted for COPD exacerbation on 01/08/2015 which she worsen and required BiPAP. In the ICU for days when she was improved she was transferred to the floor.  Assessment/Plan:   Acute respiratory failure with hypoxia (HCC) due to COPD exacerbation and possibly mild acute diastolic heart failure - Admitted on 01/07/2015 cancer to step down requiring BiPAP for respiratory failure.  Start tapering IV Solu-Medrol, continue IV antibiotics, still requiring 4 L of oxygen to maintain saturations greater than 90%.. Check a d-dimer, if elevated will order CT PE protocol - Her BNP is 52, she is obese and elderly to probably her threshold is probably lower, we'll give a single dose of Lasix. - Going through Dr. Sherene Sires note patient at baseline does not wheeze. - Chest x-ray seems to have some mild pleural effusion and maybe a little bit of vascular congestion. - Resume Spiriva.  - transfer her to telemetry  Essential hypertension Continue bisoprolol , Coreg DC'd  High anion gap lactic acidosis: Likely due to hypoxia now cleared.  Electrolytes imbalance hyperkalemia/ hypomagnesemia: These were repleted stable.  GERD Cont PPI.  Irritable bowel syndrome Cont home meds.  Uncontrolled diabetes mellitus type 2 any complications and not on insulin: - Her metformin was held due to her hypoxia and admission to the hospital. Her creatinine is stable, lactic acidosis resolved. Discontinue metformin as the patient will receive IV contrast today     DVT Prophylaxis - Lovenox ordered.  Family Communication:  none Disposition Plan: Home when stable. Code Status:     Code Status Orders        Start     Ordered   01/07/15 2212  Full code   Continuous     01/07/15 2211        IV Access:    Peripheral IV   Procedures and diagnostic studies:   Dg Chest Port 1 View  Feb 03, 2015  CLINICAL DATA:  COPD exacerbation, history hypertension, coronary artery disease post PTCA, smoking, irritable bowel syndrome EXAM: PORTABLE CHEST 1 VIEW COMPARISON:  Portable exam 0730 hours compared to 01/08/2015 FINDINGS: Normal heart size, mediastinal contours, and pulmonary vascularity. Atherosclerotic calcification aortic arch. Bibasilar atelectasis. Lungs otherwise grossly clear. No definite pleural effusion or pneumothorax. IMPRESSION: Bibasilar atelectasis. Electronically Signed   By: Ulyses Southward M.D.   On: 02/03/2015 09:57     Medical Consultants:    None.  Anti-Infectives:   Anti-infectives    Start     Dose/Rate Route Frequency Ordered Stop   01/08/15 1600  levofloxacin (LEVAQUIN) IVPB 750 mg     750 mg 100 mL/hr over 90 Minutes Intravenous Every 24 hours 01/07/15 2211 February 03, 2015 1808   01/07/15 1645  levofloxacin (LEVAQUIN) IVPB 750 mg     750 mg 100 mL/hr over 90 Minutes Intravenous  Once 01/07/15 1641 01/07/15 1916      Subjective:    Patricia Wall patient uses 2 L of oxygen at night, still quite hypoxic no significant wheezing heard   Objective:    Filed Vitals:   03-Feb-2015 2244 01/12/15 0536 01/12/15 0804 01/12/15 1254  BP:  128/68  Pulse: 103 100    Temp:  98.1 F (36.7 C)    TempSrc:  Oral    Resp:  17    Height:      Weight:      SpO2: 94% 92% 90% 90%    Intake/Output Summary (Last 24 hours) at 01/12/15 1315 Last data filed at 01/12/15 0944  Gross per 24 hour  Intake    530 ml  Output   1150 ml  Net   -620 ml   Filed Weights   01/07/15 2206 01/08/15 1645 01/11/15 1358  Weight: 96.616 kg (213 lb) 96.7 kg (213 lb 3 oz) 94.53 kg (208 lb 6.4 oz)     Exam: Gen:  NAD Cardiovascular:  RRR, No M/R/G, negative JVD Chest and lungs:  Good air movement but wheezing bilaterally, some appreciated right lung crackles.  Abdomen:  Abdomen soft, NT/ND, + BS Extremities:  No C/E/C   Data Reviewed:    Labs: Basic Metabolic Panel:  Recent Labs Lab 01/07/15 1539 01/07/15 2336 01/09/15 0253 01/09/15 2228 01/10/15 0455 01/11/15 0408 01/12/15 0517  NA 141  --  141 133* 136  --  140  K 3.8  --  3.7 4.1 3.4*  --  3.9  CL 100*  --  101 95* 96*  --  98*  CO2 25  --  --  34*  GLUCOSE 184*  --  144* 291* 176*  --  227*  BUN 8  --  --  24*  CREATININE 0.81 0.75 0.68 0.81 0.78  --  0.86  CALCIUM 9.7  --  9.0 8.4* 8.4*  --  8.8*  MG  --   --   --  1.5* 2.4 2.4 2.1  PHOS  --  1.9* 4.0  --  3.7 2.5 3.2   GFR Estimated Creatinine Clearance: 74.6 mL/min (by C-G formula based on Cr of 0.86). Liver Function Tests: No results for input(s): AST, ALT, ALKPHOS, BILITOT, PROT, ALBUMIN in the last 168 hours. No results for input(s): LIPASE, AMYLASE in the last 168 hours. No results for input(s): AMMONIA in the last 168 hours. Coagulation profile No results for input(s): INR, PROTIME in the last 168 hours.  CBC:  Recent Labs Lab 01/07/15 1539 01/07/15 2336 01/09/15 0253 01/10/15 0455 01/11/15 0408 01/12/15 0517  WBC 9.9 6.5 23.6* 19.3* 13.5* 9.4  NEUTROABS 6.3  --   --  17.4* 12.4* 8.6*  HGB 11.3* 10.3* 10.5* 11.1* 10.3* 10.7*  HCT 37.9 33.9* 35.4* 36.5 35.1* 36.2  MCV 84.6 83.5 83.9 83.1 83.0 82.5  PLT 284 232 324 334 271 229   Cardiac Enzymes:  Recent Labs Lab 01/07/15 2336 01/08/15 0320  TROPONINI <0.03 <0.03   BNP (last 3 results) No results for input(s): PROBNP in the last 8760 hours. CBG:  Recent Labs Lab 01/11/15 2005 01/11/15 2341 01/12/15 0411 01/12/15 0744 01/12/15 1217  GLUCAP 334* 158* 179* 249* 330*   D-Dimer: No results for input(s): DDIMER in the last 72 hours. Hgb A1c: No results  for input(s): HGBA1C in the last 72 hours. Lipid Profile: No results for input(s): CHOL, HDL, LDLCALC, TRIG, CHOLHDL, LDLDIRECT in the last 72 hours. Thyroid function studies: No results for input(s): TSH, T4TOTAL, T3FREE, THYROIDAB in the last 72 hours.  Invalid input(s): FREET3 Anemia work up: No results for input(s): VITAMINB12, FOLATE, FERRITIN, TIBC, IRON, RETICCTPCT in the last 72 hours. Sepsis Labs:  Recent Labs Lab 01/07/15 1600 01/07/15 1910  01/09/15 0253 01/10/15 0455 01/11/15 0408 01/12/15 0517  PROCALCITON  --   --   --  <0.10  --  <0.10  --   WBC  --   --   < > 23.6* 19.3* 13.5* 9.4  LATICACIDVEN 2.50* 1.04  --   --   --   --   --   < > = values in this interval not displayed. Microbiology Recent Results (from the past 240 hour(s))  Blood culture (routine x 2)     Status: None (Preliminary result)   Collection Time: 01/07/15  4:56 PM  Result Value Ref Range Status   Specimen Description BLOOD RIGHT FOREARM  Final   Special Requests BOTTLES DRAWN AEROBIC AND ANAEROBIC 5CC  Final   Culture NO GROWTH 4 DAYS  Final   Report Status PENDING  Incomplete  Blood culture (routine x 2)     Status: None (Preliminary result)   Collection Time: 01/07/15  4:59 PM  Result Value Ref Range Status   Specimen Description BLOOD LEFT FOREARM  Final   Special Requests BOTTLES DRAWN AEROBIC AND ANAEROBIC 5CC  Final   Culture NO GROWTH 4 DAYS  Final   Report Status PENDING  Incomplete  MRSA PCR Screening     Status: Abnormal   Collection Time: 01/07/15 10:36 PM  Result Value Ref Range Status   MRSA by PCR POSITIVE (A) NEGATIVE Final    Comment:        The GeneXpert MRSA Assay (FDA approved for NASAL specimens only), is one component of a comprehensive MRSA colonization surveillance program. It is not intended to diagnose MRSA infection nor to guide or monitor treatment for MRSA infections. RESULT CALLED TO, READ BACK BY AND VERIFIED WITH: MICHELLE @0003  01/08/15 MKELLY       Medications:   . antiseptic oral rinse  7 mL Mouth Rinse q12n4p  . aspirin  81 mg Oral QHS  . atorvastatin  40 mg Oral QHS  . benzonatate  100 mg Oral TID  . bisoprolol  10 mg Oral Daily  . budesonide (PULMICORT) nebulizer solution  0.5 mg Nebulization BID  . calcium carbonate  1 tablet Oral Daily  . chlorhexidine  15 mL Mouth Rinse BID  . clopidogrel  75 mg Oral Daily  . dextromethorphan-guaiFENesin  1 tablet Oral BID  . DULoxetine  60 mg Oral BID  . enoxaparin (LOVENOX) injection  50 mg Subcutaneous QHS  . famotidine  40 mg Oral QHS  . gabapentin  600 mg Oral BID  . insulin aspart  0-20 Units Subcutaneous Q4H  . ipratropium  0.5 mg Nebulization Q12H  . letrozole  2.5 mg Oral Daily  . levalbuterol  0.63 mg Nebulization Q4H  . metFORMIN  1,000 mg Oral BID WC  . methylPREDNISolone (SOLU-MEDROL) injection  40 mg Intravenous Q6H  . multivitamin with minerals  1 tablet Oral Daily  . mupirocin ointment  1 application Nasal BID  . niacin  2,000 mg Oral QHS  . pantoprazole  80 mg Oral BID  . tiotropium  18 mcg Inhalation Daily   Continuous Infusions:   Time spent: 25 min   LOS: 5 days   Patricia Wall,Patricia Wall  [facility] Pager [phone]  *Please refer to [email or web address], password [email or web address] to get updated schedule on who will round on this patient, as hospitalists switch teams weekly. If 7PM-7AM, please contact night-coverage at [email or web address], password [email or web address] for any overnight needs.  01/12/2015, 1:15 PM

## 2015-01-12 NOTE — Care Management Important Message (Signed)
Important Message  Patient Details  Name: Vella RedheadJoan B Pollett MRN: [id number] Date of Birth: 11-16-1950   Medicare Important Message Given:  Yes-second notification given    Leone Havenaylor, Nikolaj Geraghty Clinton, RN 01/12/2015, 11:28 AMImportant Message  Patient Details  Name: Vella RedheadJoan B Mckoy MRN: [id number] Date of Birth: 11-16-1950   Medicare Important Message Given:  Yes-second notification given    Leone Havenaylor, Destinie Thornsberry Clinton, RN 01/12/2015, 11:28 AM

## 2015-01-12 NOTE — Progress Notes (Signed)
Inpatient Diabetes Program Recommendations  AACE/ADA: New Consensus Statement on Inpatient Glycemic Control (2015)  Target Ranges:  Prepandial:   less than 140 mg/dL      Peak postprandial:   less than 180 mg/dL (1-2 hours)      Critically ill patients:  140 - 180 mg/dL   Review of Glycemic Control  Diabetes history: DM 2 Outpatient Diabetes medications: Metformin 1,000 mg BID Current orders for Inpatient glycemic control: Metformin 1,000 mg BID, Novolog Resistant Q4hrs  Inpatient Diabetes Program Recommendations: Insulin - Meal Coverage: Glucose levels are elevated due to steroid use. Please consider starting meal coverage, Novolog 5 units TID.   Thanks,  Christena DeemShannon Nicolet Griffy RN, MSN, [facility] Inpatient Diabetes Coordinator Team Pager [phone] (8a-5p)

## 2015-01-12 NOTE — Progress Notes (Signed)
PT Cancellation Note  Patient Details Name: Patricia Wall MRN: [id number] DOB: [birth year]   Cancelled Treatment:    Reason Eval/Treat Not Completed: Medical issues which prohibited therapy Pt on bedrest. Will await increase in activity orders prior to initiation of PT evaluation.   Blake DivineShauna A Cadince Hilscher 01/12/2015, 2:52 PM Patricia RedShauna Laurana Magistro, PT, DPT [phone]

## 2015-01-12 NOTE — Care Management Note (Addendum)
Case Management Note  Patient Details  Name: Vella RedheadJoan B Muraoka MRN: [id number] Date of Birth: Jul 20, 1950  Subjective/Objective:    Date:  01/12/15 Spoke with patient at the bedside along with Erskine Emeryobert Milhollen, (spouse). Introduced self as Sports coachcase manager and explained role in discharge planning and how to be reached. Ver[place] in [place]  [place] . Has DME oxygen through [employer] in home patient , she is on 2 liters at home at night but now needs it continously. Expressed potential need for  Rolling walker and narrow w/chair and wide bsc. Verified patient anticipates to go home with family, at time of discharge and will have full-time supervision by family friends  at this time to best of their knowledge.  [facility] Patient denied needing help with their medication. Patient  is driven by spouse or children to MD appointments. Verified patient [facility]   Plan: CM will continue to follow for discharge planning and [facility] resources.                 Action/Plan:   Expected Discharge Date:                  Expected Discharge Plan:  Home w Home Health Services  In-House Referral:     Discharge planning Services  CM Consult  Post Acute Care Choice:    Choice offered to:     DME Arranged:    DME Agency:     HH Arranged:    HH Agency:     Status of Service:  In process, will continue to follow  Medicare Important Message Given:  Yes-second notification given Date Medicare IM Given:    Medicare IM give by:    Date Additional Medicare IM Given:    Additional Medicare Important Message give by:     If discussed at Long Length of Stay Meetings, dates discussed:    Additional Comments:  Leone Havenaylor, Topanga Alvelo Clinton, RN 01/12/2015, 12:00 PM

## 2015-01-12 NOTE — Evaluation (Signed)
Physical Therapy Evaluation Patient Details Name: Patricia Wall MRN: [id number] DOB: 03/01/1951 Today's Date: 01/12/2015   History of Present Illness  Patient is a 64 y/o female presents from her cardiologist office due to hypoxia in the 80s and tachypnea. Pt was admitted for Acute respiratory failure with hypoxia (HCC) due to COPD exacerbation and possibly mild acute diastolic heart failure. Chest x-ray seems to have some mild pleural effusion and maybe a little bit of vascular congestion.PMH includes COPD on nocturnal home oxygen, tobacco abuse, depression, GERD, PUD, HTN, HLD, CAD, TIA.  Clinical Impression  Patient presents with dyspnea on exertion, balance deficits, impaired endurance and drop in Sp02 on RA with mobility. Pt reports multiple falls at home. High fall risk. Requires hands on min A for ambulation for balance. Pt lives with spouse who has had a stroke and pt reports he is not very steady either. Recommend ST SNF to maximize independence, mobility and minimize fall risk prior to return home. Will follow acutely.      Follow Up Recommendations SNF;Supervision/Assistance - 24 hour    Equipment Recommendations  None recommended by PT    Recommendations for Other Services OT consult     Precautions / Restrictions Precautions Precautions: Fall Precaution Comments: reports multiple falls at home. Restrictions Weight Bearing Restrictions: No      Mobility  Bed Mobility Overal bed mobility: Needs Assistance Bed Mobility: Supine to Sit;Sit to Supine     Supine to sit: Supervision;HOB elevated Sit to supine: Supervision;HOB elevated   General bed mobility comments: Increased time. Able to scoot self up in bed with rails and cues.   Transfers Overall transfer level: Needs assistance Equipment used: Rolling walker (2 wheeled) Transfers: Sit to/from Stand Sit to Stand: Min assist         General transfer comment: Min A for safety. Stood from [employer], from toilet x  1 using grab bar. Sway upon standing. Dyspnea.  Ambulation/Gait Ambulation/Gait assistance: Min assist Ambulation Distance (Feet): 20 Feet (x2 bouts) Assistive device: Rolling walker (2 wheeled) Gait Pattern/deviations: Step-through pattern;Decreased stride length;Trunk flexed   Gait velocity interpretation: <1.8 ft/sec, indicative of risk for recurrent falls General Gait Details: Very unsteady gait with posterior lean and sway noted during standing rest breaks. Min A for RW navigation. DOE. Sp02 dropped to 79% post ambulation bout on RA. Resolved within a few minutes on 5L/min 02 to >90%.  Stairs            Wheelchair Mobility    Modified Rankin (Stroke Patients Only)       Balance Overall balance assessment: Needs assistance Sitting-balance support: Feet supported;No upper extremity supported Sitting balance-Leahy Scale: Fair     Standing balance support: During functional activity Standing balance-Leahy Scale: Poor Standing balance comment: relient on RW and external support for balance.                              Pertinent Vitals/Pain Pain Assessment: No/denies pain    Home Living Family/patient expects to be discharged to:: Private residence Living Arrangements: Spouse/significant other (Spouse had a stroke.) Available Help at Discharge: Family;Available 24 hours/day Type of Home: House Home Access: Stairs to enter Entrance Stairs-Rails: Right Entrance Stairs-Number of Steps: 3 Home Layout: One level Home Equipment: Walker - 2 wheels;Cane - single point;Wheelchair - manual      Prior Function Level of Independence: Independent with assistive device(s)  Comments: Reports she is supposed to use w/c at all times but does not want too. Has an "old RW" that used to be her mom's and does not want to use that either. Multiple falls.     Hand Dominance        Extremity/Trunk Assessment   Upper Extremity Assessment: Defer to OT  evaluation           Lower Extremity Assessment: Generalized weakness         Communication   Communication: No difficulties  Cognition Arousal/Alertness: Awake/alert Behavior During Therapy: WFL for tasks assessed/performed Overall Cognitive Status: Impaired/Different from baseline Area of Impairment: Safety/judgement         Safety/Judgement: Decreased awareness of safety;Decreased awareness of deficits     General Comments: Despite multiple falls, pt not using DME for mobility.     General Comments General comments (skin integrity, edema, etc.): Family showed up at end of evaluation.    Exercises        Assessment/Plan    PT Assessment Patient needs continued PT services  PT Diagnosis Generalized weakness;Difficulty walking   PT Problem List Cardiopulmonary status limiting activity;Decreased strength;Decreased balance;Decreased mobility;Decreased safety awareness;Decreased activity tolerance  PT Treatment Interventions Balance training;Gait training;Functional mobility training;Therapeutic activities;Therapeutic exercise;Patient/family education;Stair training   PT Goals (Current goals can be found in the Care Plan section) Acute Rehab PT Goals Patient Stated Goal: to stop falling PT Goal Formulation: With patient Time For Goal Achievement: 01/26/15 Potential to Achieve Goals: Fair    Frequency Min 3X/week   Barriers to discharge Decreased caregiver support No support at home as spouse had a stroke.    Co-evaluation               End of Session Equipment Utilized During Treatment: Gait belt;Oxygen Activity Tolerance: Treatment limited secondary to medical complications (Comment) (drop in Sp02 with mobility.) Patient left: in bed;with call bell/phone within reach;with family/visitor present;with bed alarm set Nurse Communication: Mobility status         Time: [id number] PT Time Calculation (min) (ACUTE ONLY): 26 min   Charges:   PT  Evaluation $Initial PT Evaluation Tier I: 1 Procedure PT Treatments $Therapeutic Activity: 8-22 mins   PT G Codes:        Oluwanifemi Petitti A Bud Kaeser 01/12/2015, 4:16 PM Mylo RedShauna Carigan Lister, PT, DPT [phone]

## 2015-01-13 LAB — BASIC METABOLIC PANEL
Anion gap: 11 (ref 5–15)
BUN: 25 mg/dL — ABNORMAL HIGH (ref 6–20)
CALCIUM: 8.5 mg/dL — AB (ref 8.9–10.3)
CHLORIDE: 95 mmol/L — AB (ref 101–111)
CO2: 29 mmol/L (ref 22–32)
CREATININE: 0.72 mg/dL (ref 0.44–1.00)
GFR calc Af Amer: >60 mL/min (ref >60)
GFR calc non Af Amer: >60 mL/min (ref >60)
GLUCOSE: 231 mg/dL — AB (ref 65–99)
Potassium: 4 mmol/L (ref 3.5–5.1)
Sodium: 135 mmol/L (ref 135–145)

## 2015-01-13 LAB — CBC WITH DIFFERENTIAL/PLATELET
Basophils Absolute: 0 10*3/uL (ref 0.0–0.1)
Basophils Relative: 0 %
EOS ABS: 0 10*3/uL (ref 0.0–0.7)
EOS PCT: 0 %
HCT: 37.7 % (ref 36.0–46.0)
Hemoglobin: 11.9 g/dL — ABNORMAL LOW (ref 12.0–15.0)
LYMPHS ABS: 0.8 10*3/uL (ref 0.7–4.0)
Lymphocytes Relative: 6 %
MCH: 25.4 pg — AB (ref 26.0–34.0)
MCHC: 31.6 g/dL (ref 30.0–36.0)
MCV: 80.4 fL (ref 78.0–100.0)
MONO ABS: 0.3 10*3/uL (ref 0.1–1.0)
MONOS PCT: 3 %
Neutro Abs: 11.3 10*3/uL — ABNORMAL HIGH (ref 1.7–7.7)
Neutrophils Relative %: 91 %
PLATELETS: 231 10*3/uL (ref 150–400)
RBC: 4.69 MIL/uL (ref 3.87–5.11)
RDW: 16.3 % — AB (ref 11.5–15.5)
WBC: 12.4 10*3/uL — AB (ref 4.0–10.5)

## 2015-01-13 LAB — MAGNESIUM: MAGNESIUM: 1.9 mg/dL (ref 1.7–2.4)

## 2015-01-13 LAB — GLUCOSE, CAPILLARY
GLUCOSE-CAPILLARY: 443 mg/dL — AB (ref 65–99)
GLUCOSE-CAPILLARY: 480 mg/dL — AB (ref 65–99)
GLUCOSE-CAPILLARY: 369 mg/dL — AB (ref 65–99)
GLUCOSE-CAPILLARY: 293 mg/dL — AB (ref 65–99)
GLUCOSE-CAPILLARY: 215 mg/dL — AB (ref 65–99)
Glucose-Capillary: 194 mg/dL — ABNORMAL HIGH (ref 65–99)
Glucose-Capillary: 239 mg/dL — ABNORMAL HIGH (ref 65–99)
Glucose-Capillary: 265 mg/dL — ABNORMAL HIGH (ref 65–99)

## 2015-01-13 LAB — PHOSPHORUS: Phosphorus: 3.3 mg/dL (ref 2.5–4.6)

## 2015-01-13 MED ORDER — FREESTYLE LANCETS MISC: Status: AC

## 2015-01-13 MED ORDER — PREDNISONE 20 MG PO TABS
40.0000 mg | ORAL_TABLET | Freq: Every day | ORAL | Status: DC
  Administered 2015-01-13: 40 mg via ORAL
  Filled 2015-01-13: qty 2

## 2015-01-13 MED ORDER — BENZONATATE 100 MG PO CAPS: 100.0000 mg | ORAL_CAPSULE | Freq: Three times a day (TID) | ORAL | Status: DC

## 2015-01-13 MED ORDER — INSULIN GLARGINE 100 UNIT/ML ~~LOC~~ SOLN
25.0000 [IU] | Freq: Once | SUBCUTANEOUS | Status: AC
  Administered 2015-01-13: 25 [IU] via SUBCUTANEOUS
  Filled 2015-01-13: qty 0.25

## 2015-01-13 MED ORDER — INSULIN ASPART 100 UNIT/ML ~~LOC~~ SOLN
10.0000 [IU] | Freq: Once | SUBCUTANEOUS | Status: AC
  Administered 2015-01-13: 10 [IU] via SUBCUTANEOUS

## 2015-01-13 MED ORDER — BISOPROLOL FUMARATE 10 MG PO TABS: 10.0000 mg | ORAL_TABLET | Freq: Every day | ORAL | Status: DC

## 2015-01-13 MED ORDER — BLOOD GLUCOSE METER KIT: PACK | Status: AC

## 2015-01-13 MED ORDER — FREESTYLE SYSTEM KIT: 1.0000 | PACK | Status: DC | PRN

## 2015-01-13 MED ORDER — INSULIN ASPART 100 UNIT/ML ~~LOC~~ SOLN
15.0000 [IU] | Freq: Once | SUBCUTANEOUS | Status: AC
  Administered 2015-01-13: 15 [IU] via SUBCUTANEOUS

## 2015-01-13 MED ORDER — INSULIN GLARGINE 100 UNIT/ML ~~LOC~~ SOLN: 25.0000 [IU] | Freq: Every day | SUBCUTANEOUS | Status: DC

## 2015-01-13 MED ORDER — FUROSEMIDE 10 MG/ML IJ SOLN
40.0000 mg | Freq: Once | INTRAMUSCULAR | Status: AC
  Administered 2015-01-13: 40 mg via INTRAVENOUS
  Filled 2015-01-13: qty 4

## 2015-01-13 MED ORDER — LEVALBUTEROL HCL 0.63 MG/3ML IN NEBU
0.6300 mg | INHALATION_SOLUTION | Freq: Three times a day (TID) | RESPIRATORY_TRACT | Status: DC
  Administered 2015-01-13 (×2): 0.63 mg via RESPIRATORY_TRACT
  Filled 2015-01-13 (×2): qty 3

## 2015-01-13 MED ORDER — "INSULIN SYRINGE 30G X 1/2"" 0.5 ML MISC": Status: DC

## 2015-01-13 MED ORDER — PREDNISONE 10 MG (21) PO TBPK: 10.0000 mg | ORAL_TABLET | Freq: Every day | ORAL | Status: DC

## 2015-01-13 NOTE — Progress Notes (Signed)
CRITICAL VALUE ALERT  Critical value received:  CBG 443  Date of notification:  01/13/15  Time of notification:  1230  Critical value read back:Yes.    Nurse who received alert:  Bennie Pieriniyndi Zinedine Ellner  MD notified (1st page):  Abrol  Time of first page:  1230  MD notified (2nd page):  Time of second page:  Responding MD:  Susie CassetteAbrol  Time MD responded:  1232  Dr Susie CassetteAbrol ordered Insulin and Lantus. Will administer that.

## 2015-01-13 NOTE — Progress Notes (Signed)
NURSING PROGRESS NOTE  Patricia Wall [id number] Discharge Data: 01/13/2015 7:44 PM Attending Provider: Reyne Dumas, MD QJF:HLKT,GYBWLSL Carlean Jews, MD     [name] to be D/C'd Home per MD order.  Discussed with the patient the After Visit Summary and all questions fully answered. All IV's discontinued with no bleeding noted. All belongings returned to patient for patient to take home.   Last Vital Signs:  Blood pressure 125/62, pulse 97, temperature 98.9 F (37.2 C), temperature source Oral, resp. rate 20, height 5' 6"  (1.676 m), weight 94.53 kg (208 lb 6.4 oz), SpO2 94 %.  Discharge Medication List   Medication List    STOP taking these medications        carvedilol 3.125 MG tablet  Commonly known as:  COREG      TAKE these medications        aspirin 81 MG tablet  Take 81 mg by mouth at bedtime.     atorvastatin 40 MG tablet  Commonly known as:  LIPITOR  Take 40 mg by mouth at bedtime.     benzonatate 100 MG capsule  Commonly known as:  TESSALON  Take 1 capsule (100 mg total) by mouth 3 (three) times daily.     bisoprolol 10 MG tablet  Commonly known as:  ZEBETA  Take 1 tablet (10 mg total) by mouth daily.     blood glucose meter kit and supplies  Dispense based on patient and insurance preference. Use up to four times daily as directed. (FOR ICD-9 250.00, 250.01).     budesonide-formoterol 160-4.5 MCG/ACT inhaler  Commonly known as:  SYMBICORT  Inhale 2 puffs into the lungs 2 (two) times daily.     calcium carbonate 600 MG Tabs tablet  Commonly known as:  OS-CAL  Take 600 mg by mouth every morning.     CAMBIA 50 MG Pack  Generic drug:  Diclofenac Potassium  Take 50 mg by mouth daily as needed (headache). 4-6 TIMES BY MOUTH PER MONTH     chlorzoxazone 500 MG tablet  Commonly known as:  PARAFON  Take 500 mg by mouth 3 (three) times daily as needed (headache). Take 1-1 1/2 tablets every 8 hours as needed for headache     clopidogrel 75 MG tablet  Commonly  known as:  PLAVIX  Take 75 mg by mouth every morning.     dextromethorphan-guaiFENesin 30-600 MG 12hr tablet  Commonly known as:  MUCINEX DM  Take 1 tablet by mouth 2 (two) times daily.     DULoxetine 60 MG capsule  Commonly known as:  CYMBALTA  Take 60 mg by mouth 2 (two) times daily.     FLUTTER Devi  Use as directed     freestyle lancets  Use as instructed     gabapentin 300 MG capsule  Commonly known as:  NEURONTIN  Take 600 mg by mouth 2 (two) times daily.     glucose monitoring kit monitoring kit  1 each by Does not apply route as needed for other.     insulin glargine 100 UNIT/ML injection  Commonly known as:  LANTUS  Inject 0.25 mLs (25 Units total) into the skin at bedtime.     INSULIN SYRINGE .5CC/30GX1/2" 30G X 1/2" 0.5 ML Misc  100     letrozole 2.5 MG tablet  Commonly known as:  FEMARA  Take 2.5 mg by mouth every morning.     metFORMIN 500 MG tablet  Commonly known as:  GLUCOPHAGE  Take  1,000 mg by mouth 2 (two) times daily.     multivitamin tablet  Take 1 tablet by mouth every morning.     niacin 1000 MG CR tablet  Commonly known as:  NIASPAN  Take 2,000 mg by mouth at bedtime. 2 tabs by mouth at bedtime     nitroGLYCERIN 0.4 MG SL tablet  Commonly known as:  NITROSTAT  Place 0.4 mg under the tongue every 5 (five) minutes as needed for chest pain (may repeat x3).     omeprazole 40 MG capsule  Commonly known as:  PRILOSEC  Take 40 mg by mouth daily before breakfast.     ondansetron 8 MG tablet  Commonly known as:  ZOFRAN  Take 8 mg by mouth every 4 (four) hours as needed for nausea.     orphenadrine 100 MG tablet  Commonly known as:  NORFLEX  Take 100 mg by mouth every 12 (twelve) hours as needed for muscle spasms. FOR MUSCLE SPASMS     OXYGEN  Inhale 2 L into the lungs at bedtime.     predniSONE 10 MG (21) Tbpk tablet  Commonly known as:  STERAPRED UNI-PAK 21 TAB  Take 1 tablet (10 mg total) by mouth daily. 6 tablets for 5 days, 5  tablets for 5 days, 4 tablets for 5 days, 3 tablets for 5 days, 2 tablets for 5 days, 1 tablet for 5 days, half a tablet for 5 days then discontinue     PROAIR HFA 108 (90 BASE) MCG/ACT inhaler  Generic drug:  albuterol  Inhale 2 puffs into the lungs every 4 (four) hours as needed for wheezing or shortness of breath.     SPIRIVA HANDIHALER 18 MCG inhalation capsule  Generic drug:  tiotropium  PLACE 1 CAPSULE (18 MCG TOTAL) INTO INHALER AND INHALE DAILY.     sucralfate 1 G tablet  Commonly known as:  CARAFATE  Take 1 tablet (1 g total) by mouth 4 (four) times daily -  with meals and at bedtime.     Vitamin D 2000 UNITS tablet  Take 2,000 Units by mouth every morning.         Charolette Child, RN

## 2015-01-13 NOTE — Care Management Note (Addendum)
Case Management Note  Patient Details  Name: Patricia Wall MRN: [id number] Date of Birth: [birth year]  Subjective/Objective:       Patient is for dc today, patient is [facility], [facility], OT aide and social worker.  [facility]  Soc will begin 24-48 hrs post dc.  NCM contacted [facility] they could not find patient in their system.  NCM asked patient again about company she is with for oxygen., she states well maybe it is [facility].  Jermaine with [facility] is checking to see if she is one of their oxygen patient.  Patient will need oxygen tank to go home with.               Action/Plan:   Expected Discharge Date:                  Expected Discharge Plan:  Home w Home Health Services  In-House Referral:     Discharge planning Services  CM Consult  Post Acute Care Choice:  Durable Medical Equipment, Home Health Choice offered to:  Patient, Spouse  DME Arranged:  Oxygen, Bedside commode, Walker rolling, Wheelchair manual DME Agency:  [facility]  HH Arranged:  RN, PT, OT, Nurse's Aide, Social Work [employer] [facility]  Status of Service:  Completed, signed off  Medicare Important Message Given:  Yes-second notification given Date Medicare IM Given:    Medicare IM give by:    Date Additional Medicare IM Given:    Additional Medicare Important Message give by:     If discussed at Long Length of Stay Meetings, dates discussed:    Additional Comments:  Leone Havenaylor, Orit Sanville Clinton, RN 01/13/2015, 12:03 PM

## 2015-01-13 NOTE — Progress Notes (Signed)
Spoke with Dr. Susie CassetteAbrol about Lantus dosage for discharge.  States that she can go home on Lantus 25 units and if blood sugars run less than 250 mg/dl, she needs to call her PCP. Told patient to check blood sugars before breakfast, before supper, and at bedtime when she takes the Lantus and to keep record. Given instruction to call PCP if blood sugars less than 250 mg/dl.   Staff RN to teach patient how to give insulin and to have patient give an injection. Insulin syringes have been ordered, as well as glucose meter, strips, and lancets for home use. To have home health.  Patient has never been on insulin. Takes Metformin at home. HgbA1C is 7.7%. Appointment made for PCP on November 1. Smith MinceKendra Tanor Glaspy RN BSN CDE

## 2015-01-13 NOTE — Discharge Summary (Signed)
Physician Discharge Summary  Patricia Wall MRN: [id number] DOB/AGE: May 10, 1950 64 y.o.  PCP: Leamon Arnt, MD   Admit date: 01/07/2015 Discharge date: 01/13/2015  Discharge Diagnoses:     Principal Problem:   Acute respiratory failure with hypoxia [facility]) Active Problems:   Essential hypertension   GERD   Irritable bowel syndrome   Chest pain   COPD exacerbation (HCC)   CAD (coronary artery disease)    Follow-up recommendations Follow-up with PCP in 3-5 days , including all  additional recommended appointments as below Follow-up CBC, CMP in 3-5 days Newly started on Lantus due to uncontrolled CBG in the setting of steroid use, this may be slowly tapered down and discontinued by PCP     Medication List    STOP taking these medications        carvedilol 3.125 MG tablet  Commonly known as:  COREG      TAKE these medications        aspirin 81 MG tablet  Take 81 mg by mouth at bedtime.     atorvastatin 40 MG tablet  Commonly known as:  LIPITOR  Take 40 mg by mouth at bedtime.     benzonatate 100 MG capsule  Commonly known as:  TESSALON  Take 1 capsule (100 mg total) by mouth 3 (three) times daily.     bisoprolol 10 MG tablet  Commonly known as:  ZEBETA  Take 1 tablet (10 mg total) by mouth daily.     budesonide-formoterol 160-4.5 MCG/ACT inhaler  Commonly known as:  SYMBICORT  Inhale 2 puffs into the lungs 2 (two) times daily.     calcium carbonate 600 MG Tabs tablet  Commonly known as:  OS-CAL  Take 600 mg by mouth every morning.     CAMBIA 50 MG Pack  Generic drug:  Diclofenac Potassium  Take 50 mg by mouth daily as needed (headache). 4-6 TIMES BY MOUTH PER MONTH     chlorzoxazone 500 MG tablet  Commonly known as:  PARAFON  Take 500 mg by mouth 3 (three) times daily as needed (headache). Take 1-1 1/2 tablets every 8 hours as needed for headache     clopidogrel 75 MG tablet  Commonly known as:  PLAVIX  Take 75 mg by mouth every morning.      dextromethorphan-guaiFENesin 30-600 MG 12hr tablet  Commonly known as:  MUCINEX DM  Take 1 tablet by mouth 2 (two) times daily.     DULoxetine 60 MG capsule  Commonly known as:  CYMBALTA  Take 60 mg by mouth 2 (two) times daily.     FLUTTER Devi  Use as directed     freestyle lancets  Use as instructed     gabapentin 300 MG capsule  Commonly known as:  NEURONTIN  Take 600 mg by mouth 2 (two) times daily.     glucose monitoring kit monitoring kit  1 each by Does not apply route as needed for other.     insulin glargine 100 UNIT/ML injection  Commonly known as:  LANTUS  Inject 0.25 mLs (25 Units total) into the skin at bedtime.     INSULIN SYRINGE .5CC/30GX1/2" 30G X 1/2" 0.5 ML Misc  100     letrozole 2.5 MG tablet  Commonly known as:  FEMARA  Take 2.5 mg by mouth every morning.     metFORMIN 500 MG tablet  Commonly known as:  GLUCOPHAGE  Take 1,000 mg by mouth 2 (two) times daily.     multivitamin  tablet  Take 1 tablet by mouth every morning.     niacin 1000 MG CR tablet  Commonly known as:  NIASPAN  Take 2,000 mg by mouth at bedtime. 2 tabs by mouth at bedtime     nitroGLYCERIN 0.4 MG SL tablet  Commonly known as:  NITROSTAT  Place 0.4 mg under the tongue every 5 (five) minutes as needed for chest pain (may repeat x3).     omeprazole 40 MG capsule  Commonly known as:  PRILOSEC  Take 40 mg by mouth daily before breakfast.     ondansetron 8 MG tablet  Commonly known as:  ZOFRAN  Take 8 mg by mouth every 4 (four) hours as needed for nausea.     orphenadrine 100 MG tablet  Commonly known as:  NORFLEX  Take 100 mg by mouth every 12 (twelve) hours as needed for muscle spasms. FOR MUSCLE SPASMS     OXYGEN  Inhale 2 L into the lungs at bedtime.     predniSONE 10 MG (21) Tbpk tablet  Commonly known as:  STERAPRED UNI-PAK 21 TAB  Take 1 tablet (10 mg total) by mouth daily. 6 tablets for 5 days, 5 tablets for 5 days, 4 tablets for 5 days, 3 tablets for 5 days, 2  tablets for 5 days, 1 tablet for 5 days, half a tablet for 5 days then discontinue     PROAIR HFA 108 (90 BASE) MCG/ACT inhaler  Generic drug:  albuterol  Inhale 2 puffs into the lungs every 4 (four) hours as needed for wheezing or shortness of breath.     SPIRIVA HANDIHALER 18 MCG inhalation capsule  Generic drug:  tiotropium  PLACE 1 CAPSULE (18 MCG TOTAL) INTO INHALER AND INHALE DAILY.     sucralfate 1 G tablet  Commonly known as:  CARAFATE  Take 1 tablet (1 g total) by mouth 4 (four) times daily -  with meals and at bedtime.     Vitamin D 2000 UNITS tablet  Take 2,000 Units by mouth every morning.         Discharge Condition: *Stable  Discharge Instructions     No Known Allergies    Disposition: 01-Home or Self Care   Consults:  Critical care     Significant Diagnostic Studies:  Dg Chest Port 1 View  01/11/2015  CLINICAL DATA:  COPD exacerbation, history hypertension, coronary artery disease post PTCA, smoking, irritable bowel syndrome EXAM: PORTABLE CHEST 1 VIEW COMPARISON:  Portable exam 0730 hours compared to 01/08/2015 FINDINGS: Normal heart size, mediastinal contours, and pulmonary vascularity. Atherosclerotic calcification aortic arch. Bibasilar atelectasis. Lungs otherwise grossly clear. No definite pleural effusion or pneumothorax. IMPRESSION: Bibasilar atelectasis. Electronically Signed   By: Lavonia Dana M.D.   On: 01/11/2015 09:57   Dg Chest Port 1 View  01/08/2015  CLINICAL DATA:  SOB,HX COPD EXAM: PORTABLE CHEST 1 VIEW COMPARISON:  01/07/2015 FINDINGS: Midline trachea. Normal heart size. Atherosclerosis in the transverse aorta. No right and no definite left pleural effusion. No pneumothorax. Increased density projecting over the lung bases, greater on the left. Favored to be due to overlying breast tissues. Interstitial thickening is lower lobe predominant. No lobar consolidation. IMPRESSION: No acute cardiopulmonary disease. degraded evaluation of the  inferior chest, secondary to overlying breast tissues. Peribronchial thickening which may relate to chronic bronchitis or smoking. Atherosclerosis. Electronically Signed   By: Abigail Miyamoto M.D.   On: 01/08/2015 11:14   Dg Chest Port 1 View  01/07/2015  CLINICAL DATA:  Productive cough for 3 weeks.  Hypoxia. EXAM: PORTABLE CHEST 1 VIEW COMPARISON:  Jul 25, 2014. FINDINGS: The heart size and mediastinal contours are within normal limits. No pneumothorax or pleural effusion is noted. Stable bibasilar interstitial densities are noted most consistent with scarring. No acute pulmonary disease is noted. The visualized skeletal structures are unremarkable. IMPRESSION: No acute cardiopulmonary abnormality seen. Electronically Signed   By: Marijo Conception, M.D.   On: 01/07/2015 16:32      2-D echo LV EF: 65% -  70%  ------------------------------------------------------------------- Indications:   Chest pain 786.51.  ------------------------------------------------------------------- History:  PMH: Alcohol abuse. Back pain. Chronic obstructive pulmonary disease. Risk factors: Current tobacco use.  ------------------------------------------------------------------- Study Conclusions  - Left ventricle: The cavity size was normal. There was mild focal basal hypertrophy of the septum. Systolic function was vigorous. The estimated ejection fraction was in the range of 65% to 70%. Wall motion was normal; there were no regional wall motion abnormalities. Doppler parameters are consistent with abnormal left ventricular relaxation (grade 1 diastolic dysfunction). - Left atrium: The atrium was mildly dilated. - Right ventricle: The cavity size was mildly dilated. Wall thickness was normal. - Right atrium: The atrium was mildly dilated. - Pulmonary arteries: Systolic pressure was mildly increased. PA peak pressure: 38 mm Hg (S).   Filed Weights   01/07/15 2206 01/08/15 1645  01/11/15 1358  Weight: 96.616 kg (213 lb) 96.7 kg (213 lb 3 oz) 94.53 kg (208 lb 6.4 oz)     Microbiology: Recent Results (from the past 240 hour(s))  Blood culture (routine x 2)     Status: None   Collection Time: 01/07/15  4:56 PM  Result Value Ref Range Status   Specimen Description BLOOD RIGHT FOREARM  Final   Special Requests BOTTLES DRAWN AEROBIC AND ANAEROBIC 5CC  Final   Culture NO GROWTH 5 DAYS  Final   Report Status 01/12/2015 FINAL  Final  Blood culture (routine x 2)     Status: None   Collection Time: 01/07/15  4:59 PM  Result Value Ref Range Status   Specimen Description BLOOD LEFT FOREARM  Final   Special Requests BOTTLES DRAWN AEROBIC AND ANAEROBIC 5CC  Final   Culture NO GROWTH 5 DAYS  Final   Report Status 01/12/2015 FINAL  Final  MRSA PCR Screening     Status: Abnormal   Collection Time: 01/07/15 10:36 PM  Result Value Ref Range Status   MRSA by PCR POSITIVE (A) NEGATIVE Final    Comment:        The GeneXpert MRSA Assay (FDA approved for NASAL specimens only), is one component of a comprehensive MRSA colonization surveillance program. It is not intended to diagnose MRSA infection nor to guide or monitor treatment for MRSA infections. RESULT CALLED TO, READ BACK BY AND VERIFIED WITH: MICHELLE @0003  01/08/15 MKELLY        Blood Culture    Component Value Date/Time   SDES BLOOD LEFT FOREARM 01/07/2015 1659   SPECREQUEST BOTTLES DRAWN AEROBIC AND ANAEROBIC 5CC 01/07/2015 1659   CULT NO GROWTH 5 DAYS 01/07/2015 1659   REPTSTATUS 01/12/2015 FINAL 01/07/2015 1659      Labs: Results for orders placed or performed during the hospital encounter of 01/07/15 (from the past 48 hour(s))  Glucose, capillary     Status: Abnormal   Collection Time: 01/11/15  5:20 PM  Result Value Ref Range   Glucose-Capillary 308 (H) 65 - 99 mg/dL  Glucose, capillary     Status: Abnormal  Collection Time: 01/11/15  8:05 PM  Result Value Ref Range   Glucose-Capillary 334  (H) 65 - 99 mg/dL   Comment 1 Notify RN    Comment 2 Document in Chart   Glucose, capillary     Status: Abnormal   Collection Time: 01/11/15 11:41 PM  Result Value Ref Range   Glucose-Capillary 158 (H) 65 - 99 mg/dL  Glucose, capillary     Status: Abnormal   Collection Time: 01/12/15  4:11 AM  Result Value Ref Range   Glucose-Capillary 179 (H) 65 - 99 mg/dL  CBC with Differential/Platelet     Status: Abnormal   Collection Time: 01/12/15  5:17 AM  Result Value Ref Range   WBC 9.4 4.0 - 10.5 K/uL   RBC 4.39 3.87 - 5.11 MIL/uL   Hemoglobin 10.7 (L) 12.0 - 15.0 g/dL   HCT 36.2 36.0 - 46.0 %   MCV 82.5 78.0 - 100.0 fL   MCH 24.4 (L) 26.0 - 34.0 pg   MCHC 29.6 (L) 30.0 - 36.0 g/dL   RDW 16.6 (H) 11.5 - 15.5 %   Platelets 229 150 - 400 K/uL   Neutrophils Relative % 91 %   Neutro Abs 8.6 (H) 1.7 - 7.7 K/uL   Lymphocytes Relative 7 %   Lymphs Abs 0.6 (L) 0.7 - 4.0 K/uL   Monocytes Relative 2 %   Monocytes Absolute 0.1 0.1 - 1.0 K/uL   Eosinophils Relative 0 %   Eosinophils Absolute 0.0 0.0 - 0.7 K/uL   Basophils Relative 0 %   Basophils Absolute 0.0 0.0 - 0.1 K/uL  Magnesium     Status: None   Collection Time: 01/12/15  5:17 AM  Result Value Ref Range   Magnesium 2.1 1.7 - 2.4 mg/dL  Phosphorus     Status: None   Collection Time: 01/12/15  5:17 AM  Result Value Ref Range   Phosphorus 3.2 2.5 - 4.6 mg/dL  Basic metabolic panel     Status: Abnormal   Collection Time: 01/12/15  5:17 AM  Result Value Ref Range   Sodium 140 135 - 145 mmol/L   Potassium 3.9 3.5 - 5.1 mmol/L   Chloride 98 (L) 101 - 111 mmol/L   CO2 34 (H) 22 - 32 mmol/L   Glucose, Bld 227 (H) 65 - 99 mg/dL   BUN 24 (H) 6 - 20 mg/dL   Creatinine, Ser 0.86 0.44 - 1.00 mg/dL   Calcium 8.8 (L) 8.9 - 10.3 mg/dL   GFR calc non Af Amer >60 >60 mL/min   GFR calc Af Amer >60 >60 mL/min    Comment: (NOTE) The eGFR has been calculated using the CKD EPI equation. This calculation has not been validated in all clinical  situations. eGFR's persistently <60 mL/min signify possible Chronic Kidney Disease.    Anion gap 8 5 - 15  Glucose, capillary     Status: Abnormal   Collection Time: 01/12/15  7:44 AM  Result Value Ref Range   Glucose-Capillary 249 (H) 65 - 99 mg/dL  Glucose, capillary     Status: Abnormal   Collection Time: 01/12/15 12:17 PM  Result Value Ref Range   Glucose-Capillary 330 (H) 65 - 99 mg/dL  D-dimer, quantitative (not at [facility])     Status: None   Collection Time: 01/12/15  2:35 PM  Result Value Ref Range   D-Dimer, Quant 0.39 0.00 - 0.48 ug/mL-FEU    Comment:        AT THE INHOUSE ESTABLISHED CUTOFF  VALUE OF 0.48 ug/mL FEU, THIS ASSAY HAS BEEN DOCUMENTED IN THE LITERATURE TO HAVE A SENSITIVITY AND NEGATIVE PREDICTIVE VALUE OF AT LEAST 98 TO 99%.  THE TEST RESULT SHOULD BE CORRELATED WITH AN ASSESSMENT OF THE CLINICAL PROBABILITY OF DVT / VTE.   Glucose, capillary     Status: Abnormal   Collection Time: 01/12/15  4:30 PM  Result Value Ref Range   Glucose-Capillary 286 (H) 65 - 99 mg/dL  Glucose, capillary     Status: Abnormal   Collection Time: 01/12/15  7:56 PM  Result Value Ref Range   Glucose-Capillary 267 (H) 65 - 99 mg/dL  Glucose, capillary     Status: Abnormal   Collection Time: 01/13/15 12:05 AM  Result Value Ref Range   Glucose-Capillary 239 (H) 65 - 99 mg/dL  Glucose, capillary     Status: Abnormal   Collection Time: 01/13/15  4:11 AM  Result Value Ref Range   Glucose-Capillary 194 (H) 65 - 99 mg/dL  CBC with Differential/Platelet     Status: Abnormal   Collection Time: 01/13/15  6:40 AM  Result Value Ref Range   WBC 12.4 (H) 4.0 - 10.5 K/uL   RBC 4.69 3.87 - 5.11 MIL/uL   Hemoglobin 11.9 (L) 12.0 - 15.0 g/dL   HCT 37.7 36.0 - 46.0 %   MCV 80.4 78.0 - 100.0 fL   MCH 25.4 (L) 26.0 - 34.0 pg   MCHC 31.6 30.0 - 36.0 g/dL   RDW 16.3 (H) 11.5 - 15.5 %   Platelets 231 150 - 400 K/uL   Neutrophils Relative % 91 %   Neutro Abs 11.3 (H) 1.7 - 7.7 K/uL    Lymphocytes Relative 6 %   Lymphs Abs 0.8 0.7 - 4.0 K/uL   Monocytes Relative 3 %   Monocytes Absolute 0.3 0.1 - 1.0 K/uL   Eosinophils Relative 0 %   Eosinophils Absolute 0.0 0.0 - 0.7 K/uL   Basophils Relative 0 %   Basophils Absolute 0.0 0.0 - 0.1 K/uL  Magnesium     Status: None   Collection Time: 01/13/15  6:40 AM  Result Value Ref Range   Magnesium 1.9 1.7 - 2.4 mg/dL  Phosphorus     Status: None   Collection Time: 01/13/15  6:40 AM  Result Value Ref Range   Phosphorus 3.3 2.5 - 4.6 mg/dL  Basic metabolic panel     Status: Abnormal   Collection Time: 01/13/15  6:40 AM  Result Value Ref Range   Sodium 135 135 - 145 mmol/L   Potassium 4.0 3.5 - 5.1 mmol/L   Chloride 95 (L) 101 - 111 mmol/L   CO2 29 22 - 32 mmol/L   Glucose, Bld 231 (H) 65 - 99 mg/dL   BUN 25 (H) 6 - 20 mg/dL   Creatinine, Ser 0.72 0.44 - 1.00 mg/dL   Calcium 8.5 (L) 8.9 - 10.3 mg/dL   GFR calc non Af Amer >60 >60 mL/min   GFR calc Af Amer >60 >60 mL/min    Comment: (NOTE) The eGFR has been calculated using the CKD EPI equation. This calculation has not been validated in all clinical situations. eGFR's persistently <60 mL/min signify possible Chronic Kidney Disease.    Anion gap 11 5 - 15  Glucose, capillary     Status: Abnormal   Collection Time: 01/13/15  8:10 AM  Result Value Ref Range   Glucose-Capillary 265 (H) 65 - 99 mg/dL  Glucose, capillary     Status: Abnormal  Collection Time: 01/13/15 12:10 PM  Result Value Ref Range   Glucose-Capillary 443 (H) 65 - 99 mg/dL     Lipid Panel     Component Value Date/Time   CHOL  08/29/2009 0849    157        ATP III CLASSIFICATION:  <200     mg/dL   Desirable  200-239  mg/dL   Borderline High  >=240    mg/dL   High          TRIG 313* 08/29/2009 0849   HDL 31* 08/29/2009 0849   CHOLHDL 5.1 08/29/2009 0849   VLDL 63* 08/29/2009 0849   LDLCALC  08/29/2009 0849    63        Total Cholesterol/HDL:CHD Risk Coronary Heart Disease Risk Table                      Men   Women  1/2 Average Risk   3.4   3.3  Average Risk       5.0   4.4  2 X Average Risk   9.6   7.1  3 X Average Risk  23.4   11.0        Use the calculated Patient Ratio above and the CHD Risk Table to determine the patient's CHD Risk.        ATP III CLASSIFICATION (LDL):  <100     mg/dL   Optimal  100-129  mg/dL   Near or Above                    Optimal  130-159  mg/dL   Borderline  160-189  mg/dL   High  >190     mg/dL   Very High     Lab Results  Component Value Date   HGBA1C 7.7* 01/08/2015     Lab Results  Component Value Date   [facility]  08/29/2009    63        Total Cholesterol/HDL:CHD Risk Coronary Heart Disease Risk Table                     Men   Women  1/2 Average Risk   3.4   3.3  Average Risk       5.0   4.4  2 X Average Risk   9.6   7.1  3 X Average Risk  23.4   11.0        Use the calculated Patient Ratio above and the CHD Risk Table to determine the patient's CHD Risk.        ATP III CLASSIFICATION (LDL):  <100     mg/dL   Optimal  100-129  mg/dL   Near or Above                    Optimal  130-159  mg/dL   Borderline  160-189  mg/dL   High  >190     mg/dL   Very High   CREATININE 0.72 01/13/2015    Patricia Wall is an 64 y.o. female with past medical history of COPD on 2 L of oxygen at bedtime, 12/12/12 PFT> FEV1 1.41 (53%) ratio 54 with no significant reversibility and DLCO 47 % who came into the hospital for shortness of breath admitted for COPD exacerbation on 01/08/2015 which she worsen and required BiPAP. In the ICU for days when she was improved she was transferred to the floor.  Assessment/Plan:   Acute respiratory failure with hypoxia (HCC) due to COPD exacerbation and possibly mild acute diastolic heart failure - Admitted on 01/07/2015  to step down requiring BiPAP for respiratory failure.  Initially started on high-dose IV  Solu-Medrol, Levaquin,   requiring 4 L of oxygen to maintain saturations greater than  90%.. Negative d-dimer, taper down to 3 L of oxygen Patient will be discharged on 3 L of oxygen continuous, subjectively the patient feels excellent and would like to be discharged home - Her BNP is 52, , 2-D echo with results as above- patient is followed by Dr. Melvyn Novas in the outpatient setting - Chest x-ray seems to have some mild pleural effusion and maybe a little bit of vascular congestion. - Resumed Spiriva.  Patient to be discharged home on a slow prednisone taper and she will follow-up with her pulmonologist  Essential hypertension Continue bisoprolol , Coreg DC'd  High anion gap lactic acidosis: Likely due to hypoxia now cleared.  Electrolytes imbalance hyperkalemia/ hypomagnesemia: These were repleted stable.  GERD Cont PPI.  Irritable bowel syndrome Cont home meds.  Uncontrolled diabetes mellitus type 2 any complications and not on insulin: - Hemoglobin A1c 7.7 Her metformin was held due to her hypoxia and admission to the hospital. Her creatinine is stable, lactic acidosis resolved. Resumed metformin , patient also received insulin teaching and education from diabetes coordinator and was started on Lantus 25 units at bedtime because of she CBG in the 400s during this hospitalization. She has been advised to slowly taper her Lantus down , during the time that she is tapering her steroids          Discharge Exam:    Blood pressure 125/58, pulse 101, temperature 97.8 F (36.6 C), temperature source Oral, resp. rate 18, height 5' 6"  (1.676 m), weight 94.53 kg (208 lb 6.4 oz), SpO2 92 %.  Gen: NAD Cardiovascular: RRR, No M/R/G, negative JVD Chest and lungs: Good air movement but wheezing bilaterally, some appreciated right lung crackles.  Abdomen: Abdomen soft, NT/ND, + BS Extremities: No C/E/C        Follow-up Information    Follow up with [facility].   Why:  rolling walker, bsc, w/chair   Contact information:   [address] [place] [address] [phone]       Follow up with ANDY,CAMILLE L, MD On 01/20/2015.   Specialty:  Family Medicine   Why:  Appointment with Dr. Jonni Sanger is on 01/20/15 at 1:30pm   Contact information:   [place] [address] [phone]       Follow up with [facility].   Why:  HHRN, PT, OT, aide and social worker   Contact information:   [address] [place] [address] [phone]       Signed: Reyne Dumas 01/13/2015, 12:54 PM        Time spent >45 mins

## 2015-01-13 NOTE — Progress Notes (Signed)
SATURATION QUALIFICATIONS: (This note is used to comply with regulatory documentation for home oxygen)  Patient Saturations on Room Air at Rest = 90%  Patient Saturations on Room Air while Ambulating = 74-88%  Patient Saturations on 2 Liters of oxygen while Ambulating = 95%

## 2015-01-13 NOTE — Progress Notes (Signed)
CBG 215.  Dr Susie CassetteAbrol said pt can be discharged if blood glucose is less than 250.

## 2015-01-13 NOTE — Progress Notes (Signed)
NCM spoke with patient and stated that pt eval recs snf for her, patient states she is not going to a nursing home, because she feels if she goes there she will not come out.  NCM tole her it was for short term.  She states no she is not going, she is going home, she has three daughters who can help her and her spouse.

## 2015-01-14 ENCOUNTER — Telehealth: Payer: Self-pay | Admitting: Internal Medicine

## 2015-01-14 DIAGNOSIS — IMO0001 Reserved for inherently not codable concepts without codable children: Secondary | ICD-10-CM

## 2015-01-14 NOTE — Telephone Encounter (Signed)
lmtcb x1 for Patricia Wall

## 2015-01-15 NOTE — Telephone Encounter (Signed)
lmtcb for BJ

## 2015-01-16 NOTE — Telephone Encounter (Signed)
Fine with me but be sure has f/u ov with all meds in hand in next 2 weeks me or Tammy NP

## 2015-01-16 NOTE — Telephone Encounter (Signed)
Pt scheduled for 11/11 with TP.  Nothing further needed.

## 2015-01-16 NOTE — Telephone Encounter (Signed)
Pt returning calling and can be reached @ [phone].Caren GriffinsStanley A Dalton

## 2015-01-16 NOTE — Telephone Encounter (Signed)
lmtcb X1 for pt to schedule hfu with MW or TP in next 2 weeks.   Order placed for poc.

## 2015-01-16 NOTE — Telephone Encounter (Signed)
Per recent hospitalization note 10/25: Patient will be discharged on 3 L of oxygen continuous, subjectively the patient feels excellent and would like to be discharged home --  [facility]. Pt never had portable O2 Pt was only on O2 at [facility]. Please advise MW thanks

## 2015-01-30 ENCOUNTER — Ambulatory Visit (INDEPENDENT_AMBULATORY_CARE_PROVIDER_SITE_OTHER): Payer: Medicare Other | Admitting: Adult Health

## 2015-01-30 ENCOUNTER — Encounter: Payer: Self-pay | Admitting: Adult Health

## 2015-01-30 VITALS — BP 124/70 | HR 99 | Temp 98.2°F | Ht 66.0 in | Wt 205.0 lb

## 2015-01-30 DIAGNOSIS — I6523 Occlusion and stenosis of bilateral carotid arteries: Secondary | ICD-10-CM

## 2015-01-30 DIAGNOSIS — J441 Chronic obstructive pulmonary disease with (acute) exacerbation: Secondary | ICD-10-CM

## 2015-01-30 DIAGNOSIS — G4734 Idiopathic sleep related nonobstructive alveolar hypoventilation: Secondary | ICD-10-CM | POA: Diagnosis not present

## 2015-01-30 NOTE — Assessment & Plan Note (Signed)
Recent COPD exacerbation, now resolving Patient is encouraged on smoking cessation. She is continue on her current regimen..[name]

## 2015-01-30 NOTE — Assessment & Plan Note (Signed)
Continue on oxygen at bedtime and with activity

## 2015-01-30 NOTE — Progress Notes (Signed)
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 rec Prilosec Take 30-60 min before first meal of the day  Stop all inhalers except ventolin and use it up to 2 puffs every 4 hours if can't catch your breath Anoro open once and take two dep drags each am as soon as you get up each day    05/30/14 Wall Follow up  GOLD II copd and Med Review  Returns for follow up and med review  We reviewed all her meds and organized them into a med calendar  Insurance will not cover BREO any more.  Recent fall with rib injury  Seen at ER in [place] told she has cracked rib.  Getting better but still very sore.  No chest pain,orthopnea, edema or fever.  rec Follow med calendar closely and bring to each visit.  Begin Symbicort in place of BREO due to insurance  Continue on Spiriva 1 puff daily    07/25/2014  Acute  ov/Patricia Wall re: aecopd/ GOLD II  On coreg/ using top part of med calendar but not the prns at bottom  Chief Complaint  Patient presents with  . Acute Visit    prod cough w/green mucus; passing out when coughing; chest congestion; chest tightness; SOB at all times  was doing fine on breo then gradually  worse since changed to symbicort with poor hfa  Cough /breathing worse in am's, has sense of sob even at rest/ harsh upper airway quality coughing  rec zpak Prednisone 10 mg take  4 each  am x 2 days,   2 each am x 2 days,  1 each am x 2 days and stop  Reduce the carevdilol to one-half twice daily  For cough mucinex dm up to 1200 mg every 12 hours and the flutter valve  See Patricia Wall w/in 4 weeks> DID NOT DO     10/31/2014 f/u ov/Patricia Wall re:  Patricia CanningGold 2 COPD very confused with meds / instructions / no med calendar / supposed to be on symb/spiriva maint rx  Chief Complaint  Patient presents with  . Acute Visit    C/o increase SOB, prod cough (yellow phlem), wheezing, chest tx getting worse x 1 week     was better but resumed smoking then gradually worse x one week, still comfortable at rest p saba  >>zpack and pred taper   12/09/14 Follow up : Follow up : GOLD II COPD and Med Review  Pt returns for 1 month follow up and med review .  We reviewed all her meds and organized them into a med calendar with pt education.  Appears to be taking correctly .  Agrees to flu shot today .  Had COPD flare last ov , tx w/ zpack and steroid  taper.  She is some better but still has lingering cough and congestion clear/white.  She continues to smoke , dicussed cessation . She has cut down to 3 cigs/d No fever, chest pain, orthopnea, or n/v/d.  > no changes  01/30/2015 Post Hospital follow up : GOLD II COPD  Patient returns for a post hospital follow-up Patient was admitted October 19 through October 25 for a COPD exacerbation and decompensated diastolic heart failure. She required initial BiPAP support. She was treated with IV antibiotics, steroids, and nebulized bronchodilators.. She was discharged on a prednisone taper.  Patient says since discharge she has continued to feel weak. She did have a fall and went to the emergency room in [facility]. She says x-rays and CT were done with no fracture. She did require staples to a scalp laceration.  Patient is on daily narcotics for chronic pain along with gabapentin and muscle relaxers.  She denies any hemoptysis, orthopnea, PND, leg swelling,  or fever. Says that she continues to feel weak. Cough and shortness of breath or slightly better. She does get up some white to yellow mucus. Intermittently. She remains on Spiriva and Symbicort. Says that she has not smoked since discharge.        Current Medications, Allergies, Complete Past Medical History, Past Surgical History, Family History, and Social History were reviewed in Patricia Wall record.    ROS  The following are not active complaints unless bolded sore throat, dysphagia, dental problems, itching, sneezing,  nasal congestion or excess/ purulent secretions, ear ache,   fever, chills, sweats, unintended wt loss, pleuritic or exertional cp, hemoptysis,  orthopnea pnd or leg swelling, presyncope, palpitations, heartburn, abdominal pain, anorexia, nausea, vomiting, diarrhea  or change in bowel or urinary habits, change in stools or urine, dysuria,hematuria,  rash, arthralgias, visual complaints, headache, numbness weakness or ataxia or problems with walking or coordination,  change in mood/affect or memory.            Objective:  Physical Exam  amb wf nad  In w/c    04/02/2013  198  >   10/02/2013   207 >  201 10/15/13 > 12/30/2013  207 >209 01/27/2014 > 05/02/2014 215>211 05/30/14 > 07/25/2014   214 > 10/31/2014  221 >213 12/09/14 >205 01/30/2015      HEENT mild turbinate edema.  Edentulous   / orophynx clear   Nl external ear canals without cough reflex   NECK :  without JVD/Nodes/TM/ nl carotid upstrokes bilaterally   LUNGS: no acc muscle use, diminshed BS in bases , no wheezes   CV:  RRR  no s3 or murmur or increase in P2, no edema   ABD:  soft and nontender with nl excursion in the supine position. No bruits or organomegaly, bowel sounds nl  MS:  warm without deformities, calf tenderness, cyanosis or clubbing  SKIN: warm and dry without lesions  , bruising along face , scalp , torso and arms   NEURO:  alert, approp, no deficits       CXR     01/11/15  BB atx   Assessment:

## 2015-01-30 NOTE — Patient Instructions (Signed)
Continue on Symbicort 2 puffs Twice daily  .   Continue on Spiriva 1 puff daily  Follow up Dr. Sherene SiresWert  In 2 months and As needed   Please contact office for sooner follow up if symptoms do not improve or worsen or seek emergency care

## 2015-01-31 NOTE — Progress Notes (Signed)
Chart and office note reviewed in detail  > agree with a/p as outlined    

## 2015-02-03 ENCOUNTER — Telehealth: Payer: Self-pay | Admitting: Internal Medicine

## 2015-02-03 NOTE — Telephone Encounter (Signed)
We just saw her on 01/30/15 and we didn't rec a neb then and my concern is she'll get confused on maint vs prns so prefer she return here first with all meds in hand if any worse breathing and go from there (her copd is not that bad)

## 2015-02-03 NOTE — Telephone Encounter (Signed)
Called spoke with Jessice w/ [facility]. Made her aware of below. She reports she disagree's with this as she see's pt quality of life in her home. She scheduled pt an appt to be seen next week by MW on Tuesday. Aware pt needs to bring all medications with her. Nothing further needed

## 2015-02-03 NOTE — Telephone Encounter (Signed)
[facility], states that pt is having chronic SOB.  02 sats are staying normal on 2lpm 02.  Shanda BumpsJessica believes pt would benefit from daily neb tx use.  States that this is nothing new to the pt but her quality of life is decreasing.  Pt currently has albuterol rescue inhaler, spiriva, and symbicort.    [facility]    MW please advise on your recs.  Thanks!

## 2015-02-10 ENCOUNTER — Ambulatory Visit (INDEPENDENT_AMBULATORY_CARE_PROVIDER_SITE_OTHER): Payer: Medicare Other | Admitting: Internal Medicine

## 2015-02-10 ENCOUNTER — Encounter: Payer: Self-pay | Admitting: Internal Medicine

## 2015-02-10 ENCOUNTER — Telehealth: Payer: Self-pay | Admitting: Internal Medicine

## 2015-02-10 ENCOUNTER — Ambulatory Visit (INDEPENDENT_AMBULATORY_CARE_PROVIDER_SITE_OTHER)
Admission: RE | Admit: 2015-02-10 | Discharge: 2015-02-10 | Disposition: A | Discharge status: Home / Self Care | Payer: Medicare Other | Source: Ambulatory Visit | Attending: Internal Medicine | Admitting: Internal Medicine

## 2015-02-10 VITALS — BP 90/60 | HR 107 | Ht 66.0 in | Wt 202.0 lb

## 2015-02-10 DIAGNOSIS — I6523 Occlusion and stenosis of bilateral carotid arteries: Secondary | ICD-10-CM

## 2015-02-10 DIAGNOSIS — J9611 Chronic respiratory failure with hypoxia: Secondary | ICD-10-CM

## 2015-02-10 DIAGNOSIS — J449 Chronic obstructive pulmonary disease, unspecified: Secondary | ICD-10-CM | POA: Diagnosis not present

## 2015-02-10 DIAGNOSIS — IMO0001 Reserved for inherently not codable concepts without codable children: Secondary | ICD-10-CM

## 2015-02-10 MED ORDER — ALBUTEROL SULFATE (2.5 MG/3ML) 0.083% IN NEBU: 2.5000 mg | INHALATION_SOLUTION | RESPIRATORY_TRACT | Status: DC | PRN

## 2015-02-10 MED ORDER — ALBUTEROL SULFATE (2.5 MG/3ML) 0.083% IN NEBU: 2.5000 mg | INHALATION_SOLUTION | RESPIRATORY_TRACT | Status: DC

## 2015-02-10 MED ORDER — TIOTROPIUM BROMIDE MONOHYDRATE 2.5 MCG/ACT IN AERS: INHALATION_SPRAY | RESPIRATORY_TRACT | Status: DC

## 2015-02-10 NOTE — Telephone Encounter (Signed)
Ok to write it like they want

## 2015-02-10 NOTE — Patient Instructions (Signed)
Plan A = Automatic = symbicort 160 2 every 12 hours                                     spiriva 2 pffs each am   Plan B = Backup - Only use your albuterol as a rescue medication to be used if you can't catch your breath by resting or doing a relaxed purse lip breathing pattern.  - The less you use it, the better it will work when you need it. - Ok to use up to 2 puffs  every 4 hours if you must but call for immediate appointment if use goes up over your usual need - Don't leave home without it !!  (think of it like the spare tire for your car)   Plan C= crisis - only use nebulizer after you try Plan B if it fails  Prednisone 10 mg take  4 each am x 2 days,   2 each am x 2 days,  1 each am x 2 days  And a half for 2 days and stop (or to repeat)   Please remember to go to the  x-ray department downstairs for your tests - we will call you with the results when they are available.  Remember to use the flutter valve especially in am

## 2015-02-10 NOTE — Progress Notes (Signed)
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 rec Prilosec Take 30-60 min before first meal of the day  Stop all inhalers except ventolin and use it up to 2 puffs every 4 hours if can't catch your breath Anoro open once and take two dep drags each am as soon as you get up each day    05/30/14 NP Follow up  GOLD II copd and Med Review  Returns for follow up and med review  We reviewed all her meds and organized them into a med calendar  Insurance will not cover BREO any more.  Recent fall with rib injury  Seen at ER in [place] told she has cracked rib.  Getting better but still very sore.  No chest pain,orthopnea, edema or fever.  rec Follow med calendar closely and bring to each visit.  Begin Symbicort in place of BREO due to insurance  Continue on Spiriva 1 puff daily    07/25/2014  Acute  ov/Jaimeson Gopal re: aecopd/ GOLD II  On coreg/ using top part of med calendar but not the prns at bottom  Chief Complaint  Patient presents with  . Acute Visit    prod cough w/green mucus; passing out when coughing; chest congestion; chest tightness; SOB at all times  was doing fine on breo then gradually  worse since changed to symbicort with poor hfa  Cough /breathing worse in am's, has sense of sob even at rest/ harsh upper airway quality coughing  rec zpak Prednisone 10 mg take  4 each  am x 2 days,   2 each am x 2 days,  1 each am x 2 days and stop  Reduce the carevdilol to one-half twice daily  For cough mucinex dm up to 1200 mg every 12 hours and the flutter valve  See Tammy NP w/in 4 weeks> DID NOT DO     01/30/2015 Post Hospital follow up : GOLD II COPD  Patient returns for a post hospital follow-up Patient was admitted October 19 through October 25 for a COPD exacerbation and decompensated diastolic heart failure. She required initial BiPAP support. She was treated with IV antibiotics, steroids, and nebulized bronchodilators.. She was discharged on a prednisone taper.  Patient says since discharge she has continued to feel weak. She did have a fall and went [facility]. She says x-rays and CT were done with no fracture. She did require staples to a scalp laceration.  Patient is on daily narcotics for chronic pain along with gabapentin and muscle relaxers.  She denies any hemoptysis, orthopnea, PND, leg swelling, or fever. Says that she continues to feel weak. Cough and shortness of breath or slightly better. She does get up some white to yellow mucus. Intermittently.  She remains on Spiriva and Symbicort. Says that she has not smoked since discharge. rec Continue on Symbicort 2 puffs Twice daily  .   Continue on Spiriva 1 puff daily      02/10/2015 acute extended ov/Anayla Giannetti re: GOLD II criteria/ 02 dep 2lpm 24/7  Chief Complaint  Patient presents with  . Acute Visit    Pt states that her breathing has progressively worsened since her last visit. She states that she has been wheezing alot and sats have been low with exertion. She is using albuterol inhaler 3 x per day on average.    2lpm 24/7 / symb/spiriva dpi with mostly dry cough   No obvious day to day or daytime variability or assoc chronic cough or cp or chest tightness, subjective wheeze or overt sinus or hb symptoms. No unusual exp hx or h/o childhood pna/ asthma or knowledge of  premature birth.  Sleeping ok without nocturnal  or early am exacerbation  of respiratory  c/o's or need for noct saba. Also denies any obvious fluctuation of symptoms with weather or environmental changes or other aggravating or alleviating factors except as outlined above   Current Medications, Allergies, Complete Past Medical History, Past Surgical History, Family History, and Social History were reviewed in [employer] record.  ROS  The following are not active complaints unless bolded sore throat, dysphagia, dental problems, itching, sneezing,  nasal congestion or excess/ purulent secretions, ear ache,   fever, chills, sweats, unintended wt loss, classically pleuritic or exertional cp, hemoptysis,  orthopnea pnd or leg swelling, presyncope, palpitations, abdominal pain, anorexia, nausea, vomiting, diarrhea  or change in bowel or bladder habits, change in stools or urine, dysuria,hematuria,  rash, arthralgias, visual complaints, headache, numbness, weakness or ataxia or problems with walking or coordination,  change in mood/affect or memory.               Objective:  Physical Exam  Hoarse wf nad w/c bound on 2lpm    04/02/2013  198  >   10/02/2013   207 >  201 10/15/13 > 12/30/2013  207 >209 01/27/2014 > 05/02/2014 215>211 05/30/14 > 07/25/2014   214 > 10/31/2014  221 >213 12/09/14 >205 01/30/2015 > 02/10/2015    202      HEENT mild turbinate edema.  Edentulous   / orophynx clear   Nl external ear canals without cough reflex   NECK :  without JVD/Nodes/TM/ nl carotid upstrokes bilaterally   LUNGS: no acc muscle use, diminshed BS in bases , no wheezes   CV:  RRR  no s3 or murmur or increase in P2, no edema   ABD:  soft and nontender with nl excursion in the supine position. No bruits or organomegaly, bowel sounds nl  MS:  warm without deformities, calf tenderness, cyanosis or clubbing  SKIN: warm and dry without lesions   NEURO:  alert, approp, no  deficits   CXR PA and Lateral:   02/10/2015 :    I personally reviewed images and agree with radiology impression as follows:    No active cardiopulmonary disease.   Assessment:

## 2015-02-10 NOTE — Telephone Encounter (Signed)
Rx was sent  

## 2015-02-10 NOTE — Telephone Encounter (Signed)
Spoke with Patricia Wall x [phone] States that they received an Rx for albuterol with instructions to use every 4 hours PRN wheezing and SOB.  Patricia Wall states that medicare will not accept this as written because of the PRN. Instructions that are accepted are " take q4hrs for wheezing/SOB AND PRN" or "take q4hrs for wheezing/SOB". Please advise if okay to change instructions for medicare purposes and which to change to. Thanks.

## 2015-02-11 NOTE — Progress Notes (Signed)
Quick Note:  Called and spoke to pt. Informed her of the results and recs per MW. Pt verbalized understanding and denied any further questions or concerns at this time. ______ 

## 2015-02-12 NOTE — Assessment & Plan Note (Addendum)
pft's 12/12/12 FEV1  1.41 (53%) ratio 54 with no significant reversibility and DLCO 47% corrects to 50 01/16/2013  Walked RA x 2 laps @ 185 ft each stopped due to sob, no desat     - Trial of anoro 01/16/2013 > helped but insurance would not pay - incruse one daily started 10/02/2013 >>>not covered  -Spiriva restarted 10/15/13 > [facility] but not consistent with it  10/15/13 Med calendar , 01/27/2014 > lost it at f/u 05/02/14 ,  Did not understand how to use it at f/u ov 07/25/14  05/30/14 refer to pulm rehab    DDX of  difficult airways management all start with A and  include Adherence, Ace Inhibitors, Acid Reflux, Active Sinus Disease, Alpha 1 Antitripsin deficiency, Anxiety masquerading as Airways dz,  ABPA,  allergy(esp in young), Aspiration (esp in elderly), Adverse effects of meds,  Active smokers, A bunch of PE's (a small clot burden can't cause this syndrome unless there is already severe underlying pulm or vascular dz with poor reserve) plus two Bs  = Bronchiectasis and Beta blocker use..and one C= CHF   Adherence is always the initial "prime suspect" and is a multilayered concern that requires a "trust but verify" approach in every patient - starting with knowing how to use medications, especially inhalers, correctly, keeping up with refills and understanding the fundamental difference between maintenance and prns vs those medications only taken for a very short course and then stopped and not refilled.  - The proper method of use, as well as anticipated side effects, of a metered-dose inhaler are discussed and demonstrated to the patient. Improved effectiveness after extensive coaching during this visit to a level of approximately  90% from a baseline of 50% with respimat > try change spiriva to respimat  ? Allergy/asthma component > Prednisone 10 mg take  4 each am x 2 days,   2 each am x 2 days,  1 each am x 2 days and stop  ? Adverse effects of dpi > change spiriva to  respimat  ? Active smoking > denies since 12/2014  ? Anxiety > usually at the bottom of this list of usual suspects but should be much higher on this pt's based on H and P and note already on psychotropics .   I had an extended discussion with the patient reviewing all relevant studies completed to date and  lasting 15 to 20 minutes of a 25 minute visit    Each maintenance medication was reviewed in detail including most importantly the difference between maintenance and prns and under what circumstances the prns are to be triggered using an action plan format that is not reflected in the computer generated alphabetically organized AVS but trather by a customized med calendar that reflects the AVS meds with confirmed 100% correlation.   Please see instructions for details which were reviewed in writing and the patient given a copy highlighting the part that I personally wrote and discussed at today's ov.

## 2015-02-12 NOTE — Assessment & Plan Note (Addendum)
-   See ono 09/29/13 > desat x 54 min < 89% > rec 2lpm    As of 02/10/2015 rx = 2lpm 24/7 > Adequate control on present rx, reviewed > no change in rx needed

## 2015-04-02 ENCOUNTER — Encounter: Payer: Self-pay | Admitting: Internal Medicine

## 2015-04-02 ENCOUNTER — Ambulatory Visit (INDEPENDENT_AMBULATORY_CARE_PROVIDER_SITE_OTHER): Payer: Medicare Other | Admitting: Internal Medicine

## 2015-04-02 VITALS — BP 124/82 | HR 112 | Ht 66.0 in | Wt 206.0 lb

## 2015-04-02 DIAGNOSIS — J9611 Chronic respiratory failure with hypoxia: Secondary | ICD-10-CM | POA: Diagnosis not present

## 2015-04-02 DIAGNOSIS — J449 Chronic obstructive pulmonary disease, unspecified: Secondary | ICD-10-CM | POA: Diagnosis not present

## 2015-04-02 DIAGNOSIS — IMO0001 Reserved for inherently not codable concepts without codable children: Secondary | ICD-10-CM

## 2015-04-02 NOTE — Progress Notes (Signed)
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 rec Prilosec Take 30-60 min before first meal of the day  Stop all inhalers except ventolin and use it up to 2 puffs every 4 hours if can't catch your breath Anoro open once and take two dep drags each am as soon as you get up each day    05/30/14 NP Follow up  GOLD II copd and Med Review  Returns for follow up and med review  We reviewed all her meds and organized them into a med calendar  Insurance will not cover BREO any more.  Recent fall with rib injury  Seen at ER in [place] told she has cracked rib.  Getting better but still very sore.  No chest pain,orthopnea, edema or fever.  rec Follow med calendar closely and bring to each visit.  Begin Symbicort in place of BREO due to insurance  Continue on Spiriva 1 puff daily    07/25/2014  Acute  ov/Arrick Dutton re: aecopd/ GOLD II  On coreg/ using top part of med calendar but not the prns at bottom  Chief Complaint  Patient presents with  . Acute Visit    prod cough w/green mucus; passing out when coughing; chest congestion; chest tightness; SOB at all times  was doing fine on breo then gradually  worse since changed to symbicort with poor hfa  Cough /breathing worse in am's, has sense of sob even at rest/ harsh upper airway quality coughing  rec zpak Prednisone 10 mg take  4 each  am x 2 days,   2 each am x 2 days,  1 each am x 2 days and stop  Reduce the carevdilol to one-half twice daily  For cough mucinex dm up to 1200 mg every 12 hours and the flutter valve  See Tammy NP w/in 4 weeks> DID NOT DO     01/30/2015 Post Hospital follow up : GOLD II COPD  Patient returns for a post hospital follow-up Patient was admitted October 19 through October 25 for a COPD exacerbation and decompensated diastolic heart failure. She required initial BiPAP support. She was treated with IV antibiotics, steroids, and nebulized bronchodilators.. She was discharged on a prednisone taper.  Patient says since discharge she has continued to feel weak. She did have a fall and went to the emergency room in [facility]. She says x-rays and CT were done with no fracture. She did require staples to a scalp laceration.  Patient is on daily narcotics for chronic pain along with gabapentin and muscle relaxers.  She denies any hemoptysis, orthopnea, PND, leg swelling, or fever. Says that she continues to feel weak. Cough and shortness of breath or slightly better. She does get up some white to yellow mucus. Intermittently.  She remains on Spiriva and Symbicort. Says that she has not smoked since discharge. rec Continue on Symbicort 2 puffs Twice daily  .   Continue on Spiriva 1 puff daily      02/10/2015 acute extended ov/Meesha Sek re: GOLD II criteria/ 02 dep 2lpm 24/7  Chief Complaint  Patient presents with  . Acute Visit    Pt states that her breathing has progressively worsened since her last visit. She states that she has been wheezing alot and sats have been low with exertion. She is using albuterol inhaler 3 x per day on average.    2lpm 24/7 / symb/spiriva dpi with mostly dry cough  rec Plan A = Automatic = symbicort 160 2 every 12 hours                                     spiriva 2 pffs each am  Plan B = Backup - Only use your albuterol as a rescue medication  Plan C= crisis - only  use nebulizer after you try Plan B if it fails Prednisone 10 mg take  4 each am x 2 days,   2 each am x 2 days,  1 each am x 2 days  And a half for 2 days and stop (or to repeat)   Remember to use the flutter valve especially in am      04/02/2015  f/u ov/Mirca Yale re: copd GOLD II/ 02 2lpm 24/7 / no med calendar / supposed to be on symb/spiriva maint rx  Chief Complaint  Patient presents with  . Follow-up     occ wheezing, prod cough with brownish yellow mucus. Having both cp/tightness today.    mucus brown thick x one week   No pleuritic cp/ just gen tightnesss better p saba     No obvious day to day or daytime variability or assoc chronic cough or   subjective wheeze or overt sinus or hb symptoms. No unusual exp hx or h/o childhood pna/ asthma or knowledge of premature birth.  Sleeping ok without nocturnal  or early am exacerbation  of respiratory  c/o's or need for noct saba. Also denies any obvious fluctuation of symptoms with weather or environmental changes or other aggravating or alleviating factors except as outlined above   Current Medications, Allergies, Complete Past Medical History, Past Surgical History, Family History, and Social History were reviewed in [employer] record.  ROS  The following are not active complaints unless bolded sore throat, dysphagia, dental problems, itching, sneezing,  nasal congestion or excess/ purulent secretions, ear ache,   fever, chills, sweats, unintended wt loss, classically pleuritic or exertional cp, hemoptysis,  orthopnea pnd or leg swelling, presyncope, palpitations, abdominal pain, anorexia, nausea, vomiting, diarrhea  or change in bowel or bladder habits, change in stools or urine, dysuria,hematuria,  rash, arthralgias, visual complaints, headache, numbness, weakness or ataxia or problems with walking or coordination,  change in mood/affect or memory.               Objective:  Physical Exam  Hoarse wf nad w/c bound  on 2lpm / vital signs reviewed    04/02/2013  198  >   10/02/2013   207 >  201 10/15/13 > 12/30/2013  207 >209 01/27/2014 > 05/02/2014 215>211 05/30/14 > 07/25/2014   214 > 10/31/2014  221 >213 12/09/14 >205 01/30/2015 > 02/10/2015    202 > 04/02/2015  206      HEENT mild turbinate edema.  Edentulous   / orophynx clear   Nl external ear canals without cough reflex   NECK :  without JVD/Nodes/TM/ nl carotid upstrokes bilaterally   LUNGS: no acc muscle use, diminshed BS in bases , no wheezes   CV:  RRR  no s3 or murmur or increase in P2, no edema   ABD:  soft and nontender with nl excursion in the supine position. No bruits or organomegaly, bowel sounds nl  MS:  warm without deformities, calf tenderness, cyanosis or clubbing  SKIN: warm and dry without lesions   NEURO:  alert, approp, no deficits   CXR PA and Lateral:   02/10/2015 :    I personally reviewed images and agree with radiology impression as follows:    No active cardiopulmonary disease.   Assessment:

## 2015-04-02 NOTE — Patient Instructions (Signed)
Prednisone 10 mg take  4 each am x 2 days,   2 each am x 2 days,  1 each am x 2 days and stop   Only use your albuterol as a rescue medication to be used if you can't catch your breath by resting or doing a relaxed purse lip breathing pattern.  - The less you use it, the better it will work when you need it. - Ok to use up to 2 puffs  every 4 hours if you must but call for immediate appointment if use goes up over your usual need - Don't leave home without it !!  (think of it like the spare tire for your car)  Only use your albuterol neb if you take the rescue inhaler first and it doesn't work   Please schedule a follow up visit in 3 months but call sooner if needed  - see Tammy NP to be sure all your meds match up with your calendar When return bring your medications in 2 separate bags, the ones you take no matter(automatically)  what vs the as needed (only when you feel you need them)

## 2015-04-06 ENCOUNTER — Encounter: Payer: Self-pay | Admitting: Internal Medicine

## 2015-04-06 NOTE — Assessment & Plan Note (Addendum)
-   pft's 12/12/12 FEV1  1.41 (53%) ratio 54 with no significant reversibility and DLCO 47% corrects to 50 01/16/2013  Walked RA x 2 laps @ 185 ft each stopped due to sob, no desat     - Trial of anoro 01/16/2013 > helped but insurance would not pay - incruse one daily started 10/02/2013 >>>not covered  -Spiriva restarted 10/15/13 > [facility] but not consistent with it  10/15/13 Med calendar , 01/27/2014 > lost it at f/u 05/02/14 ,  Did not understand how to use it at f/u ov 07/25/14  05/30/14 refer to pulm rehab     DDX of  difficult airways management almost all start with A and  include Adherence, Ace Inhibitors, Acid Reflux, Active Sinus Disease, Alpha 1 Antitripsin deficiency, Anxiety masquerading as Airways dz,  ABPA,  Allergy(esp in young), Aspiration (esp in elderly), Adverse effects of meds,  Active smokers, A bunch of PE's (a small clot burden can't cause this syndrome unless there is already severe underlying pulm or vascular dz with poor reserve) plus two Bs  = Bronchiectasis and Beta blocker use..and one C= CHF   Adherence is always the initial "prime suspect" and is a multilayered concern that requires a "trust but verify" approach in every patient - starting with knowing how to use medications, especially inhalers, correctly, keeping up with refills and understanding the fundamental difference between maintenance and prns vs those medications only taken for a very short course and then stopped and not refilled.  - The proper method of use, as well as anticipated side effects, of a metered-dose inhaler are discussed and demonstrated to the patient. Improved effectiveness after extensive coaching during this visit to a level of approximately 75 % from a baseline of 50 %  - not using med calendar/ suggested she do so/ showed her copy of the one we previously generated for her went over again how to use it  ? Allergy/ asthma component > Prednisone 10 mg take  4 each am x 2 days,   2  each am x 2 days,  1 each am x 2 days and stop  ? Active smoking > denies since oct 2016, reinforced as I strongly suspect she's slipping  ? Acid (or non-acid) GERD > always difficult to exclude as up to 75% of pts in some series report no assoc GI/ Heartburn symptoms> rec continue max (24h)  acid suppression and diet restrictions/ reviewed      I had an extended discussion with the patient reviewing all relevant studies completed to date and  lasting 15 to 20 minutes of a 25 minute visit    Each maintenance medication was reviewed in detail including most importantly the difference between maintenance and prns and under what circumstances the prns are to be triggered using an action plan format that is not reflected in the computer generated alphabetically organized AVS.    Please see instructions for details which were reviewed in writing and the patient given a copy highlighting the part that I personally wrote and discussed at today's ov.

## 2015-04-06 NOTE — Assessment & Plan Note (Signed)
-   See ono 09/29/13 > desat x 54 min < 89% > rec 2lpm   As of 04/02/2015 rec 2lpm 24/7

## 2015-07-06 ENCOUNTER — Encounter: Payer: Medicare Other | Admitting: Adult Health

## 2015-07-09 ENCOUNTER — Ambulatory Visit (INDEPENDENT_AMBULATORY_CARE_PROVIDER_SITE_OTHER): Payer: Medicare Other | Admitting: Adult Health

## 2015-07-09 ENCOUNTER — Encounter: Payer: Self-pay | Admitting: Adult Health

## 2015-07-09 VITALS — BP 102/68 | HR 97 | Temp 98.3°F | Ht 66.0 in | Wt 189.0 lb

## 2015-07-09 DIAGNOSIS — J9611 Chronic respiratory failure with hypoxia: Secondary | ICD-10-CM | POA: Diagnosis not present

## 2015-07-09 DIAGNOSIS — J449 Chronic obstructive pulmonary disease, unspecified: Secondary | ICD-10-CM

## 2015-07-09 DIAGNOSIS — IMO0001 Reserved for inherently not codable concepts without codable children: Secondary | ICD-10-CM

## 2015-07-09 NOTE — Patient Instructions (Signed)
Follow med calendar closely and bring to each visit.  Continue on current regimen .  follow up Dr. Wert  In 3 months and As needed   

## 2015-07-09 NOTE — Progress Notes (Signed)
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 rec Prilosec Take 30-60 min before first meal of the day  Stop all inhalers except ventolin and use it up to 2 puffs every 4 hours if can't catch your breath Anoro open once and take two dep drags each am as soon as you get up each day    05/30/14 NP Follow up  GOLD II copd and Med Review  Returns for follow up and med review  We reviewed all her meds and organized them into a med calendar  Insurance will not cover BREO any more.  Recent fall with rib injury  Seen at ER in [place] told she has cracked rib.  Getting better but still very sore.  No chest pain,orthopnea, edema or fever.  rec Follow med calendar closely and bring to each visit.  Begin Symbicort in place of BREO due to insurance  Continue on Spiriva 1 puff daily    07/25/2014  Acute  ov/Wert re: aecopd/ GOLD II  On coreg/ using top part of med calendar but not the prns at bottom  Chief Complaint  Patient presents with  . Acute Visit    prod cough w/green mucus; passing out when coughing; chest congestion; chest tightness; SOB at all times  was doing fine on breo then gradually  worse since changed to symbicort with poor hfa  Cough /breathing worse in am's, has sense of sob even at rest/ harsh upper airway quality coughing  rec zpak Prednisone 10 mg take  4 each  am x 2 days,   2 each am x 2 days,  1 each am x 2 days and stop  Reduce the carevdilol to one-half twice daily  For cough mucinex dm up to 1200 mg every 12 hours and the flutter valve  See Moksh Loomer NP w/in 4 weeks> DID NOT DO     01/30/2015 Post Hospital follow up : GOLD II COPD  Patient returns for a post hospital follow-up Patient was admitted October 19 through October 25 for a COPD exacerbation and decompensated diastolic heart failure. She required initial BiPAP support. She was treated with IV antibiotics, steroids, and nebulized bronchodilators.. She was discharged on a prednisone taper.  Patient says since discharge she has continued to feel weak. She did have a fall and went to the emergency room in [facility]. She says x-rays and CT were done with no fracture. She did require staples to a scalp laceration.  Patient is on daily narcotics for chronic pain along with gabapentin and muscle relaxers.  She denies any hemoptysis, orthopnea, PND, leg swelling, or fever. Says that she continues to feel weak. Cough and shortness of breath or slightly better. She does get up some white to yellow mucus. Intermittently.  She remains on Spiriva and Symbicort. Says that she has not smoked since discharge. rec Continue on Symbicort 2 puffs Twice daily  .   Continue on Spiriva 1 puff daily      02/10/2015 acute extended ov/Wert re: GOLD II criteria/ 02 dep 2lpm 24/7  Chief Complaint  Patient presents with  . Acute Visit    Pt states that her breathing has progressively worsened since her last visit. She states that she has been wheezing alot and sats have been low with exertion. She is using albuterol inhaler 3 x per day on average.    2lpm 24/7 / symb/spiriva dpi with mostly dry cough  rec Plan A = Automatic = symbicort 160 2 every 12 hours                                     spiriva 2 pffs each am  Plan B = Backup - Only use your albuterol as a rescue medication  Plan C= crisis - only  use nebulizer after you try Plan B if it fails Prednisone 10 mg take  4 each am x 2 days,   2 each am x 2 days,  1 each am x 2 days  And a half for 2 days and stop (or to repeat)   Remember to use the flutter valve especially in am      04/02/2015  f/u ov/Wert re: copd GOLD II/ 02 2lpm 24/7 / no med calendar / supposed to be on symb/spiriva maint rx  Chief Complaint  Patient presents with  . Follow-up     occ wheezing, prod cough with brownish yellow mucus. Having both cp/tightness today.    mucus brown thick x one week   No pleuritic cp/ just gen tightnesss better p saba   >pred taper   07/09/2015 Follow and Med review : Lazar Tierce : COPD /O2 dependent Pt returns for 3 month follow up and med review  We reviewed all her meds and organized them into a med calendar  With pt education.  She had COPD flare last ov , tx w/ streroids , feels better.  She has good and bad days. Gets winded easily with walking .  Falls easily due to balance issue.  Use oxygen 2l/m with POC when away from home.  PVX and Prevnar utd  CXR 01/2015 with no acute process.  Denies hemoptysis , chest pain, orthopnea, edema or fever.    Current Medications, Allergies, Complete Past Medical History, Past Surgical History, Family History, and Social History were reviewed in [facility] electronic medical record.  ROS  The following are not active complaints unless bolded sore throat, dysphagia, dental problems, itching, sneezing,  nasal congestion or excess/ purulent secretions, ear ache,   fever, chills, sweats, unintended wt loss, classically pleuritic or exertional cp, hemoptysis,  orthopnea pnd or leg swelling, presyncope, palpitations, abdominal pain, anorexia, nausea, vomiting, diarrhea  or change in bowel or bladder habits, change in stools or urine, dysuria,hematuria,  rash, arthralgias, visual complaints, headache, numbness, weakness or ataxia or problems with walking or coordination,  change in mood/affect or  memory.               Objective:  Physical Exam   wf nad w/c bound on 2lpm / vital signs reviewed   Filed Vitals:   07/09/15 1458  BP: 102/68  Pulse: 97  Temp: 98.3 F (36.8 C)  TempSrc: Oral  Height:  (1.676 m)  Weight: 189 lb (85.73 kg)  SpO2: 92%     04/02/2013  198  >   10/02/2013   207 >  201 10/15/13 > 12/30/2013  207 >209 01/27/2014 > 05/02/2014 215>211 05/30/14 > 07/25/2014   214 > 10/31/2014  221 >213 12/09/14 >205 01/30/2015 > 02/10/2015    202 > 04/02/2015  206      HEENT mild turbinate edema.  Edentulous   / orophynx clear   Nl external ear canals without cough reflex   NECK :  without JVD/Nodes/TM/ nl carotid upstrokes bilaterally   LUNGS: no acc muscle use, diminshed BS in bases , no wheezes   CV:  RRR  no s3 or murmur or increase in P2, no edema   ABD:  soft and nontender with nl excursion in the supine position. No bruits or organomegaly, bowel sounds nl  MS:  warm without deformities, calf tenderness, cyanosis or clubbing  SKIN: warm and dry without lesions   NEURO:  alert, approp, no deficits   CXR PA and Lateral:   02/10/2015 :      No active cardiopulmonary disease.   Dyer Klug NP-C  [facility]   07/09/2015    Assessment:

## 2015-07-09 NOTE — Assessment & Plan Note (Signed)
Controlled on O2  

## 2015-07-09 NOTE — Progress Notes (Signed)
Chart and office note reviewed in detail  > agree with a/p as outlined    

## 2015-07-09 NOTE — Assessment & Plan Note (Signed)
Controlled on rx  Patient's medications were reviewed today and patient education was given. Computerized medication calendar was adjusted/completed   Plan Follow med calendar closely and bring to each visit.  Continue on current regimen .  follow up Dr. Sherene SiresWert  In 3 months and As needed

## 2015-07-13 NOTE — Addendum Note (Signed)
Addended by: Karalee HeightOX, Julliette Frentz P on: 07/13/2015 09:18 AM   Modules accepted: Orders, Medications

## 2015-08-16 ENCOUNTER — Other Ambulatory Visit: Payer: Self-pay | Admitting: Adult Health

## 2015-08-20 ENCOUNTER — Other Ambulatory Visit: Payer: Self-pay | Admitting: Adult Health

## 2015-08-20 MED ORDER — BUDESONIDE-FORMOTEROL FUMARATE 160-4.5 MCG/ACT IN AERO: 2.0000 | INHALATION_SPRAY | Freq: Two times a day (BID) | RESPIRATORY_TRACT | Status: DC

## 2015-08-20 NOTE — Telephone Encounter (Signed)
Received paper refill request on symbicort 160 from [facility] on [address] I spoke with the pharmacist Phil and refilled pt's symbicort. Nothing further is needed at this time.  Nothing further needed.

## 2015-09-01 ENCOUNTER — Telehealth: Payer: Self-pay | Admitting: Cardiovascular Disease

## 2015-09-01 NOTE — Telephone Encounter (Signed)
You should get this note from Dr Eden EmmsNishan. Thank you very much.

## 2015-09-01 NOTE — Telephone Encounter (Signed)
Ok from cardiac standpoint to have endoscopy But should talk with Patricia Wall Dr Sherene SiresWert from pulmonary This is her biggest health issue with recent hospitalization for respiratory failure on oxygen

## 2015-09-01 NOTE — Telephone Encounter (Signed)
Will send fax to number provided. 

## 2015-09-01 NOTE — Telephone Encounter (Signed)
Request for surgical clearance:  1. What type of surgery is being performed? Endoscopy  2. When is this surgery scheduled? 09-07-15   3. Are there any medications that need to be held prior to surgery and how long?Can she hold her Plavix for 4 days prior to surgery?   4. Name of physician performing surgery? Dr Bing PlumeKeith Toledo   5. What is your office phone and fax number?336- [phone] and fax number is [phone]  6.

## 2015-10-08 ENCOUNTER — Encounter: Payer: Self-pay | Admitting: Internal Medicine

## 2015-10-08 ENCOUNTER — Ambulatory Visit (INDEPENDENT_AMBULATORY_CARE_PROVIDER_SITE_OTHER): Payer: Medicare Other | Admitting: Internal Medicine

## 2015-10-08 VITALS — BP 90/60 | HR 107 | Ht 65.0 in | Wt 182.0 lb

## 2015-10-08 DIAGNOSIS — IMO0001 Reserved for inherently not codable concepts without codable children: Secondary | ICD-10-CM

## 2015-10-08 DIAGNOSIS — Z720 Tobacco use: Secondary | ICD-10-CM

## 2015-10-08 DIAGNOSIS — J449 Chronic obstructive pulmonary disease, unspecified: Secondary | ICD-10-CM | POA: Diagnosis not present

## 2015-10-08 DIAGNOSIS — J9611 Chronic respiratory failure with hypoxia: Secondary | ICD-10-CM

## 2015-10-08 DIAGNOSIS — F17210 Nicotine dependence, cigarettes, uncomplicated: Secondary | ICD-10-CM

## 2015-10-08 NOTE — Assessment & Plan Note (Signed)
-   See ono 09/29/13 > desat x 54 min < 89% > rec 2lpm   Adequate control on present rx, reviewed > no change in rx needed  =  2lpm 24/7

## 2015-10-08 NOTE — Assessment & Plan Note (Signed)
>   3 min Discussed the risks and costs (both direct and indirect)  of smoking relative to the benefits of quitting but patient unwilling to commit at this point to a specific quit date.       

## 2015-10-08 NOTE — Progress Notes (Signed)
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 rec Prilosec Take 30-60 min before first meal of the day  Stop all inhalers except ventolin and use it up to 2 puffs every 4 hours if can't catch your breath Anoro open once and take two dep drags each am as soon as you get up each day    07/25/2014  Acute  ov/Patricia Wall re: aecopd/ GOLD II  On coreg/ using top part of med calendar but not the prns at bottom  Chief Complaint  Patient presents with  . Acute Visit    prod cough w/green mucus; passing out when coughing; chest congestion; chest tightness; SOB at all times  was doing fine on breo then gradually  worse since changed to symbicort with poor hfa  Cough /breathing worse in am's, has sense of sob even at rest/ harsh upper airway quality coughing  rec zpak Prednisone 10 mg take  4 each am x 2 days,   2 each am x 2 days,  1 each am x 2 days and stop  Reduce the carevdilol to one-half twice daily  For cough mucinex dm up to 1200 mg every 12 hours and the flutter valve  See Tammy NP w/in 4 weeks> DID NOT DO     01/30/2015 Post Hospital follow up : GOLD II COPD  Patient returns for a post hospital follow-up Patient was admitted October 19 through October 25 for a COPD exacerbation and decompensated diastolic heart failure. She required initial BiPAP support. She was treated with IV  antibiotics, steroids, and nebulized bronchodilators.. She was discharged on a prednisone taper.  Patient says since discharge she has continued to feel weak. She did have a fall and went [facility]. She says x-rays and CT were done with no fracture. She did require staples to a scalp laceration.  Patient is on daily narcotics for chronic pain along with gabapentin and muscle relaxers.  She denies any hemoptysis, orthopnea, PND, leg swelling, or fever. Says that she continues to feel weak. Cough and shortness of breath or slightly better. She does get up some white to yellow mucus. Intermittently. She remains on Spiriva and Symbicort. Says that she has not smoked since discharge. rec Continue on Symbicort 2 puffs Twice daily  .   Continue on Spiriva 1 puff daily      02/10/2015 acute extended ov/Patricia Wall re: GOLD II criteria/ 02 dep 2lpm 24/7  Chief Complaint  Patient presents with  . Acute Visit    Pt states that her breathing has progressively worsened since her last visit. She states that she has been wheezing alot and sats have been low with exertion. She is using albuterol inhaler 3 x per day on average.  2lpm 24/7 / symb/spiriva dpi with mostly dry cough  rec Plan A = Automatic = symbicort 160 2 every 12 hours                                     spiriva 2 pffs each am  Plan B = Backup - Only use your albuterol as a rescue medication  Plan C= crisis - only use nebulizer after you try Plan B if it fails Prednisone 10 mg take  4 each am x 2 days,   2 each am x 2 days,  1 each am x 2 days  And a half for 2 days and stop (or to repeat)   Remember to use the flutter valve especially in am       10/08/2015  f/u ov/Patricia Wall re:  GOLD II copd/ 02 2lpm 24/7 spiriva/symbicort  Resumed smoking  Chief Complaint  Patient presents with  . Follow-up    Breathing is overall doing well. She is using albuterol inhaler 2-3 x per wk on average.   Congested cough esp in  am/ very limited by back but not by breathing/ not using flutter valve as rec     No obvious day to day or daytime variability or assoc excess/ purulent sputum or mucus plugs or hemoptysis or cp or chest tightness, subjective wheeze or overt sinus or hb symptoms. No unusual exp hx or h/o childhood pna/ asthma or knowledge of premature birth.  Sleeping ok without nocturnal  or early am exacerbation  of respiratory  c/o's or need for noct saba. Also denies any obvious fluctuation of symptoms with weather or environmental changes or other aggravating or alleviating factors except as outlined above   Current Medications, Allergies, Complete Past Medical History, Past Surgical History, Family History, and Social History were reviewed in [facility] electronic [facility] record.    ROS  The following are not active complaints unless bolded sore throat, dysphagia, dental problems, itching, sneezing,  nasal congestion or excess/ purulent secretions, ear ache,   fever, chills, sweats, unintended wt loss, classically pleuritic or exertional cp, hemoptysis,  orthopnea pnd or leg swelling, presyncope, palpitations, abdominal pain, anorexia, nausea, vomiting, diarrhea  or change in bowel or bladder habits, change in stools or urine, dysuria,hematuria,  rash, arthralgias, visual complaints, headache, numbness, weakness or ataxia or problems with walking due to back /dizzy/falling  or coordination,  change in mood/affect or memory.          Objective:  Physical Exam   wf nad w/c bound on 2lpm /somewhat rattling cough/congested sounding/ vital signs reviewed      04/02/2013  198  >   10/02/2013   207 >  201 10/15/13 > 12/30/2013  207 >209 01/27/2014 > 05/02/2014 215>211 05/30/14 > 07/25/2014   214 > 10/31/2014  221 >213 12/09/14 >205 01/30/2015 > 02/10/2015    202 > 04/02/2015  206 >   10/08/2015   182      HEENT mild turbinate edema.  Edentulous   / orophynx clear   Nl external ear canals without cough  reflex  NECK :  without JVD/Nodes/TM/ nl carotid upstrokes bilaterally  LUNGS: no acc muscle use, diminshed BS in bases , min insp and exp rhonchi bilaterally   CV:  RRR  no s3 or murmur or increase in P2, no edema   ABD:  soft and nontender with nl excursion in the supine position.  No bruits or organomegaly, bowel sounds nl  MS:  warm without deformities, calf tenderness, cyanosis or clubbing  SKIN: warm and dry without lesions   NEURO:  alert, approp, no deficits      Assessment:

## 2015-10-08 NOTE — Assessment & Plan Note (Signed)
-   pft's 12/12/12 FEV1  1.41 (53%) ratio 54 with no significant reversibility and DLCO 47% corrects to 50 01/16/2013  Walked RA x 2 laps @ 185 ft each stopped due to sob, no desat     - Trial of anoro 01/16/2013 > helped but insurance would not pay - incruse one daily started 10/02/2013 >>>not covered  -Spiriva restarted 10/15/13 > [facility] but not consistent with it  10/15/13 Med calendar , 01/27/2014 > lost it at f/u 05/02/14 ,  Did not understand how to use it at f/u ov 07/25/14  05/30/14 refer to pulm rehab    - 10/08/2015  extensive coaching HFA effectiveness =    90%  Despite smoking she is relatively well compensated albeit extremely sedentary related to back problems > no changes needed for now but concerned not really following med calendar/ action plans consistently    Each maintenance medication was reviewed in detail including most importantly the difference between maintenance and as needed and under what circumstances the prns are to be used. This was done in the context of a medication calendar review which provided the patient with a user-friendly unambiguous mechanism for medication administration and reconciliation and provides an action plan for all active problems. It is critical that this be shown to every doctor  for modification during the office visit if necessary so the patient can use it as a working document.

## 2015-10-08 NOTE — Patient Instructions (Addendum)
No changes needed but remember your flutter valve and use it as much as you can   The key is to stop smoking completely before smoking completely stops you.  See calendar for specific medication instructions and bring it back for each and every office visit for every healthcare provider you see.  Without it,  you may not receive the best quality medical care that we feel you deserve.  You will note that the calendar groups together  your maintenance  medications that are timed at particular times of the day.  Think of this as your checklist for what your doctor has instructed you to do until your next evaluation to see what benefit  there is  to staying on a consistent group of medications intended to keep you well.  The other group at the bottom is entirely up to you to use as you see fit  for specific symptoms that may arise between visits that require you to treat them on an as needed basis.  Think of this as your action plan or "what if" list.   Separating the top medications from the bottom group is fundamental to providing you adequate care going forward.    Please schedule a follow up visit in 3 months but call sooner if needed  - consider change to spiriva respimat next ov

## 2016-01-11 ENCOUNTER — Encounter: Payer: Self-pay | Admitting: Internal Medicine

## 2016-01-11 ENCOUNTER — Ambulatory Visit (INDEPENDENT_AMBULATORY_CARE_PROVIDER_SITE_OTHER)
Admission: RE | Admit: 2016-01-11 | Discharge: 2016-01-11 | Disposition: A | Discharge status: Home / Self Care | Payer: Medicare Other | Source: Ambulatory Visit | Attending: Internal Medicine | Admitting: Internal Medicine

## 2016-01-11 ENCOUNTER — Ambulatory Visit (INDEPENDENT_AMBULATORY_CARE_PROVIDER_SITE_OTHER): Payer: Medicare Other | Admitting: Internal Medicine

## 2016-01-11 VITALS — BP 90/60 | HR 120 | Temp 98.2°F

## 2016-01-11 DIAGNOSIS — J449 Chronic obstructive pulmonary disease, unspecified: Secondary | ICD-10-CM

## 2016-01-11 DIAGNOSIS — F17210 Nicotine dependence, cigarettes, uncomplicated: Secondary | ICD-10-CM | POA: Diagnosis not present

## 2016-01-11 DIAGNOSIS — J441 Chronic obstructive pulmonary disease with (acute) exacerbation: Secondary | ICD-10-CM | POA: Diagnosis not present

## 2016-01-11 DIAGNOSIS — J9611 Chronic respiratory failure with hypoxia: Secondary | ICD-10-CM | POA: Diagnosis not present

## 2016-01-11 MED ORDER — ACETAMINOPHEN-CODEINE #3 300-30 MG PO TABS: ORAL_TABLET | ORAL | 0 refills | Status: DC

## 2016-01-11 MED ORDER — AZITHROMYCIN 250 MG PO TABS: ORAL_TABLET | ORAL | 0 refills | Status: DC

## 2016-01-11 MED ORDER — PREDNISONE 10 MG PO TABS: ORAL_TABLET | ORAL | 0 refills | Status: DC

## 2016-01-11 NOTE — Patient Instructions (Addendum)
The key is to stop smoking completely before smoking completely stops you!   Plan A = Automatic = symbicort 160 Take 2 puffs first thing in am and then another 2 puffs about 12 hours later.     Plan B = Backup Only use your albuterol as a rescue medication to be used if you can't catch your breath by resting or doing a relaxed purse lip breathing pattern.  - The less you use it, the better it will work when you need it. - Ok to use the inhaler up to 2 puffs  every 4 hours if you must but call for appointment if use goes up over your usual need - Don't leave home without it !!  (think of it like the spare tire for your car)    Plan C = Crisis - only use your albuterol nebulizer if you first try Plan B and it fails to help > ok to use the nebulizer up to every 4 hours but if start needing it regularly call for immediate appointment    Prednisone 10 mg take  4 each am x 2 days,   2 each am x 2 days,  1 each am x 2 days and stop   Zpak   For cough mucinex dm 1200 mg every 12 hours and cough into the flutter valve / ok to tylenol 3# every 4 hours as needed if still coughing or for chest pain related to cough    See Tammy NP w/in 2 weeks with all your medications, even over the counter meds, separated in two separate bags, the ones you take no matter what vs the ones you stop once you feel better and take only as needed when you feel you need them.   Tammy  will generate for you a new user friendly medication calendar that will put us all on the same page re: your medication use.     Without this process, it simply isn't possible to assure that we are providing  your outpatient care  with  the attention to detail we feel you deserve.   If we cannot assure that you're getting that kind of care,  then we cannot manage your problem effectively from this clinic.  Once you have seen Tammy and we are sure that we're all on the same page with your medication use she will arrange follow up with me.

## 2016-01-11 NOTE — Progress Notes (Signed)
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 rec Prilosec Take 30-60 min before first meal of the day  Stop all inhalers except ventolin and use it up to 2 puffs every 4 hours if can't catch your breath Anoro open once and take two dep drags each am as soon as you get up each day    07/25/2014  Acute  ov/[name] re: aecopd/ GOLD II  On coreg/ using top part of med calendar but not the prns at bottom  Chief Complaint  Patient presents with  . Acute Visit    prod cough w/green mucus; passing out when coughing; chest congestion; chest tightness; SOB at all times  was doing fine on breo then gradually  worse since changed to symbicort with poor hfa  Cough /breathing worse in am's, has sense of sob even at rest/ harsh upper airway quality coughing  rec zpak Prednisone 10 mg take  4 each am x 2 days,   2 each am x 2 days,  1 each am x 2 days and stop  Reduce the carevdilol to one-half twice daily  For cough mucinex dm up to 1200 mg every 12 hours and the flutter valve  See Tammy NP w/in 4 weeks> DID NOT DO     01/30/2015 Post Hospital follow up : GOLD II COPD  Patient returns for a post hospital follow-up Patient was admitted October 19 through October 25 for a COPD exacerbation and decompensated diastolic heart failure. She required initial BiPAP support. She was treated with IV  antibiotics, steroids, and nebulized bronchodilators.. She was discharged on a prednisone taper.  Patient says since discharge she has continued to feel weak. She did have a fall and went [facility]. She says x-rays and CT were done with no fracture. She did require staples to a scalp laceration.  Patient is on daily narcotics for chronic pain along with gabapentin and muscle relaxers.  She denies any hemoptysis, orthopnea, PND, leg swelling, or fever. Says that she continues to feel weak. Cough and shortness of breath or slightly better. She does get up some white to yellow mucus. Intermittently. She remains on Spiriva and Symbicort. Says that she has not smoked since discharge. rec Continue on Symbicort 2 puffs Twice daily  .   Continue on Spiriva 1 puff daily      02/10/2015 acute extended ov/Patricia Wall re: GOLD II criteria/ 02 dep 2lpm 24/7  Chief Complaint  Patient presents with  . Acute Visit    Pt states that her breathing has progressively worsened since her last visit. She states that she has been wheezing alot and sats have been low with exertion. She is using albuterol inhaler 3 x per day on average.  2lpm 24/7 / symb/spiriva dpi with mostly dry cough  rec Plan A = Automatic = symbicort 160 2 every 12 hours                                     spiriva 2 pffs each am  Plan B = Backup - Only use your albuterol as a rescue medication  Plan C= crisis - only use nebulizer after you try Plan B if it fails Prednisone 10 mg take  4 each am x 2 days,   2 each am x 2 days,  1 each am x 2 days  And a half for 2 days and stop (or to repeat)   Remember to use the flutter valve especially in am       10/08/2015  f/u ov/Patricia Wall re:  GOLD II copd/ 02 2lpm 24/7 spiriva/symbicort  Resumed smoking  Chief Complaint  Patient presents with  . Follow-up    Breathing is overall doing well. She is using albuterol inhaler 2-3 x per wk on average.   Congested cough esp in  am/ very limited by back but not by breathing/ not using flutter valve as rec  rec No changes needed but remember your flutter valve and use it as much as you can  The key is to stop smoking completely before smoking completely stops you. See calendar Separating the top medications from the bottom group is fundamental to providing you adequate care going forward.       01/11/2016  f/u ov/Patricia Wall re: copd/ still smoking maint rx symb 160/spiriva respimat  Chief Complaint  Patient presents with  . Follow-up    Pt c/o increased cough and SOB for the past 2 wks. She is coughing up yellow sto brown sputum and has had green nasal d/c. She uses albuterol inhaler at leasr once per day on average.   new cp on L post radiates anteriorly around C3-5 level   started after she coughed hard and every time she coughs since  Comfortable at rest p saba rx / has both saba neb and hfa and confused when to use which one  Assoc with flare of nasal congestion/ purulent drainage s sinus pain /tootchaches   No obvious day to day or daytime variability or assoc e  mucus plugs or hemoptysis or cp or chest tightness, subjective wheeze or overt hb  symptoms. No unusual exp hx or h/o childhood pna/ asthma or knowledge of premature birth.  Sleeping ok without nocturnal  or early am exacerbation  of respiratory  c/o's or need for noct saba. Also denies any obvious fluctuation of symptoms with weather or environmental changes or other aggravating or alleviating factors except as outlined above   Current Medications, Allergies, Complete Past Medical History, Past Surgical History, Family History, and Social History were reviewed in [employer] record.    ROS  The following are not active complaints unless bolded sore throat, dysphagia, dental problems, itching, sneezing,  nasal congestion or excess/ purulent secretions, ear ache,   fever, chills, sweats, unintended wt loss, classically pleuritic or  exertional cp, hemoptysis,  orthopnea pnd or leg swelling, presyncope, palpitations, abdominal pain, anorexia, nausea, vomiting, diarrhea  or change in bowel or bladder habits, change in stools or urine, dysuria,hematuria,  rash, arthralgias, visual complaints, headache, numbness, weakness or ataxia or problems with walking due to back /dizzy/falling  or coordination,  change in mood/affect  or memory.          Objective:  Physical Exam   wf nad w/c bound   /somewhat rattling cough/congested sounding/ vital signs reviewed  - Note on arrival 02 sats  98% on 2lpm  And pulse 120 p saba       04/02/2013  198  >   10/02/2013   207 >  201 10/15/13 > 12/30/2013  207 >209 01/27/2014 > 05/02/2014 215>211 05/30/14 > 07/25/2014   214 > 10/31/2014  221 >213 12/09/14 >205 01/30/2015 > 02/10/2015    202 > 04/02/2015  206 >   10/08/2015   182 > 01/11/2016 177      HEENT mild turbinate edema.  Edentulous   / orophynx clear   Nl external ear canals without cough reflex  NECK :  without JVD/Nodes/TM/ nl carotid upstrokes bilaterally  LUNGS: no acc muscle use, diminshed BS in bases ,  distant insp and exp rhonchi bilaterally   CV:  RRR  no s3 or murmur or increase in P2, no edema   ABD:  soft and nontender with nl excursion in the supine position. No bruits or organomegaly, bowel sounds nl  MS:  warm without deformities, calf tenderness, cyanosis or clubbing  SKIN: warm and dry without lesions   NEURO:  alert, approp, no deficits     CXR PA and Lateral:   01/11/2016 :    I personally reviewed images and agree with radiology impression as follows:    No active cardiopulmonary disease.       Assessment:

## 2016-01-12 NOTE — Assessment & Plan Note (Signed)

## 2016-01-12 NOTE — Assessment & Plan Note (Addendum)
12/30/2013  See ov  - see ov 01/11/2016  rx zpak/ pred x 6 d  Assoc with mscp from coughing so add tylenol #3 also and use flutter valve with each coughing fit

## 2016-01-12 NOTE — Progress Notes (Signed)
Spoke with pt and notified of results per Dr. Wert. Pt verbalized understanding and denied any questions. 

## 2016-01-12 NOTE — Assessment & Plan Note (Signed)
-   pft's 12/12/12 FEV1  1.41 (53%) ratio 54 with no significant reversibility and DLCO 47% corrects to 50 01/16/2013  Walked RA x 2 laps @ 185 ft each stopped due to sob, no desat     - Trial of anoro 01/16/2013 > helped but insurance would not pay - incruse one daily started 10/02/2013 >>>not covered  -Spiriva restarted 10/15/13 > [facility] but not consistent with it  10/15/13 Med calendar , 01/27/2014 > lost it at f/u 05/02/14 ,  Did not understand how to use it at f/u ov 07/25/14  05/30/14 refer to pulm rehab  - 01/11/2016  extensive coaching HFA effectiveness =    90%   I had an extended discussion with the patient reviewing all relevant studies completed to date and  lasting 15 to 20 minutes of a 25 minute visit on the following ongoing concerns:   1) her hfa use is so good she should continue symb 160/spiriva respimat and save the saba neb as plan C - see avs  2) having mild flare - see aecopd   3) getting confused with instructions/med reconciliation so needs med rec.  To keep things simple, I have asked the patient to first separate medicines that are perceived as maintenance, that is to be taken daily "no matter what", from those medicines that are taken on only on an as-needed basis and I have given the patient examples of both, and then return to see our NP to generate a  detailed  medication calendar which should be followed until the next physician sees the patient and updates it.    In meantime, See calendar for specific medication instructions and bring it back for each and every office visit for every healthcare provider you see.  Without it,  you may not receive the best quality medical care that we feel you deserve.  You will note that the calendar groups together  your maintenance  medications that are timed at particular times of the day.  Think of this as your checklist for what your doctor has instructed you to do until your next evaluation to see what benefit  there is   to staying on a consistent group of medications intended to keep you well.  The other group at the bottom is entirely up to you to use as you see fit  for specific symptoms that may arise between visits that require you to treat them on an as needed basis.  Think of this as your action plan or "what if" list.   Separating the top medications from the bottom group is fundamental to providing you adequate care going forward.

## 2016-01-12 NOTE — Assessment & Plan Note (Signed)
Followed in Pulmonary clinic/ [facility]/ Makyla Bye - See ono 09/29/13 > desat x 54 min < 89% > rec 2lpm   As of 01/11/2016 rec 2lpm 24/7 > note despite flare sats are ok

## 2016-01-27 ENCOUNTER — Ambulatory Visit (INDEPENDENT_AMBULATORY_CARE_PROVIDER_SITE_OTHER): Payer: Medicare Other | Admitting: Adult Health

## 2016-01-27 ENCOUNTER — Encounter: Payer: Self-pay | Admitting: Adult Health

## 2016-01-27 DIAGNOSIS — J9611 Chronic respiratory failure with hypoxia: Secondary | ICD-10-CM | POA: Diagnosis not present

## 2016-01-27 DIAGNOSIS — J441 Chronic obstructive pulmonary disease with (acute) exacerbation: Secondary | ICD-10-CM | POA: Diagnosis not present

## 2016-01-27 NOTE — Assessment & Plan Note (Signed)
Recent flare now resolved  Smoking cessation key  Patient's medications were reviewed today and patient education was given. Computerized medication calendar was adjusted/completed   Plan  Patient Instructions  Follow med calendar closely and bring to each visit.  Continue on current regimen .  Work on quitting smoking .  Follow up Dr. Sherene SiresWert  In 3 months and As needed

## 2016-01-27 NOTE — Patient Instructions (Addendum)
Follow med calendar closely and bring to each visit.  Continue on current regimen .  Work on quitting smoking .  Follow up Dr. Sherene SiresWert  In 3 months and As needed

## 2016-01-27 NOTE — Assessment & Plan Note (Signed)
Cont on o2 .  

## 2016-01-27 NOTE — Progress Notes (Signed)
Chart and office note reviewed in detail  > agree with a/p as outlined    

## 2016-01-27 NOTE — Progress Notes (Signed)
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 rec Prilosec Take 30-60 min before first meal of the day  Stop all inhalers except ventolin and use it up to 2 puffs every 4 hours if can't catch your breath Anoro open once and take two dep drags each am as soon as you get up each day    07/25/2014  Acute  ov/Wert re: aecopd/ GOLD II  On coreg/ using top part of med calendar but not the prns at bottom  Chief Complaint  Patient presents with  . Acute Visit    prod cough w/green mucus; passing out when coughing; chest congestion; chest tightness; SOB at all times  was doing fine on breo then gradually  worse since changed to symbicort with poor hfa  Cough /breathing worse in am's, has sense of sob even at rest/ harsh upper airway quality coughing  rec zpak Prednisone 10 mg take  4 each am x 2 days,   2 each am x 2 days,  1 each am x 2 days and stop  Reduce the carevdilol to one-half twice daily  For cough mucinex dm up to 1200 mg every 12 hours and the flutter valve  See Momodou Consiglio NP w/in 4 weeks> DID NOT DO     01/30/2015 Post Hospital follow up : GOLD II COPD  Patient returns for a post hospital follow-up Patient was admitted October 19 through October 25 for a COPD exacerbation and decompensated diastolic heart failure. She required initial BiPAP support. She was treated with IV  antibiotics, steroids, and nebulized bronchodilators.. She was discharged on a prednisone taper.  Patient says since discharge she has continued to feel weak. She did have a fall and [facility]. She says x-rays and CT were done with no fracture. She did require staples to a scalp laceration.  Patient is on daily narcotics for chronic pain along with gabapentin and muscle relaxers.  She denies any hemoptysis, orthopnea, PND, leg swelling, or fever. Says that she continues to feel weak. Cough and shortness of breath or slightly better. She does get up some white to yellow mucus. Intermittently. She remains on Spiriva and Symbicort. Says that she has not smoked since discharge. rec Continue on Symbicort 2 puffs Twice daily  .   Continue on Spiriva 1 puff daily      02/10/2015 acute extended ov/Wert re: GOLD II criteria/ 02 dep 2lpm 24/7  Chief Complaint  Patient presents with  . Acute Visit    Pt states that her breathing has progressively worsened since her last visit. She states that she has been wheezing alot and sats have been low with exertion. She is using albuterol inhaler 3 x per day on average.  2lpm 24/7 / symb/spiriva dpi with mostly dry cough  rec Plan A = Automatic = symbicort 160 2 every 12 hours                                     spiriva 2 pffs each am  Plan B = Backup - Only use your albuterol as a rescue medication  Plan C= crisis - only use nebulizer after you try Plan B if it fails Prednisone 10 mg take  4 each am x 2 days,   2 each am x 2 days,  1 each am x 2 days  And a half for 2 days and stop (or to repeat)   Remember to use the flutter valve especially in am       10/08/2015  f/u ov/Wert re:  GOLD II copd/ 02 2lpm 24/7 spiriva/symbicort  Resumed smoking  Chief Complaint  Patient presents with  . Follow-up    Breathing is overall doing well. She is using albuterol inhaler 2-3 x per wk on average.   Congested cough esp in  am/ very limited by back but not by breathing/ not using flutter valve as rec  rec No changes needed but remember your flutter valve and use it as much as you can  The key is to stop smoking completely before smoking completely stops you. See calendar Separating the top medications from the bottom group is fundamental to providing you adequate care going forward.       01/11/2016  f/u ov/Wert re: copd/ still smoking maint rx symb 160/spiriva respimat  Chief Complaint  Patient presents with  . Follow-up    Pt c/o increased cough and SOB for the past 2 wks. She is coughing up yellow sto brown sputum and has had green nasal d/c. She uses albuterol inhaler at leasr once per day on average.   new cp on L post radiates anteriorly around C3-5 level   started after she coughed hard and every time she coughs since  Comfortable at rest p saba rx / has both saba neb and hfa and confused when to use which one  Assoc with flare of nasal congestion/ purulent drainage s sinus pain /tootchaches >>Zpack /Pred , CXR nad   01/27/2016 Follow up : COPD /Med review  Patient returns for a two-week follow-up and medication review. Last visit. Patient was having a COPD exacerbation. He was given a Z-Pak and prednisone taper. Since last visit. Patient is feeling better w/ less cough and dyspnea. . Cutting way back on smoking . CXR last ov w /nad.  We reviewed all his medications organize them into a medication calendar with patient education. Appears be taking medications currently. Patient denies any chest pain, orthopnea, PND, or increased leg swelling  Current Medications, Allergies, Complete Past Medical History, Past Surgical History, Family History, and Social History were reviewed in [facility] electronic medical record.    ROS  The following are not active complaints unless bolded sore throat, dysphagia, dental problems, itching, sneezing,  nasal congestion or excess/ purulent secretions, ear ache,    fever, chills, sweats, unintended wt loss, classically pleuritic or exertional cp, hemoptysis,  orthopnea pnd or leg swelling, presyncope, palpitations, abdominal pain, anorexia, nausea, vomiting, diarrhea  or change in bowel or bladder habits, change in stools or urine, dysuria,hematuria,  rash, arthralgias, visual complaints, headache, numbness, weakness or ataxia or problems with walking due to back /  dizzy/falling  or coordination,  change in mood/affect or memory.          Objective:  Physical Exam   wf nad w/c bound   /somewhat rattling cough/congested sounding/ vital signs reviewed  - Note on arrival 02 sats  95% on 2l/m     Vitals:   01/27/16 1135  BP: 112/60  Pulse: 95  SpO2: 95%  Weight: 189 lb (85.7 kg)  Height: 5\' 6"  (1.676 m)     04/02/2013  198  >   10/02/2013   207 >  201 10/15/13 > 12/30/2013  207 >209 01/27/2014 > 05/02/2014 215>211 05/30/14 > 07/25/2014   214 > 10/31/2014  221 >213 12/09/14 >205 01/30/2015 > 02/10/2015    202 > 04/02/2015  206 >   10/08/2015   182 > 01/11/2016 177      HEENT mild turbinate edema.  Edentulous   / orophynx clear   Nl external ear canals without cough reflex  NECK :  without JVD/Nodes/TM/ nl carotid upstrokes bilaterally  LUNGS: no acc muscle use, diminshed BS in bases ,  distant BS   CV:  RRR  no s3 or murmur or increase in P2, no edema   ABD:  soft and nontender with nl excursion in the supine position. No bruits or organomegaly, bowel sounds nl  MS:  warm without deformities, calf tenderness, cyanosis or clubbing  SKIN: warm and dry without lesions   NEURO:  alert, approp, no deficits     CXR PA and Lateral:   01/11/2016 :      No active cardiopulmonary disease.       Assessment:

## 2016-02-09 IMAGING — CR DG CHEST 1V PORT
2 series · 2 of 2 positions shown · non-contrast
Comparison: July 25, 2014.

CLINICAL DATA: Productive cough for 3 weeks.  Hypoxia.

EXAM:
PORTABLE CHEST 1 VIEW

[AP (1 of 2)]
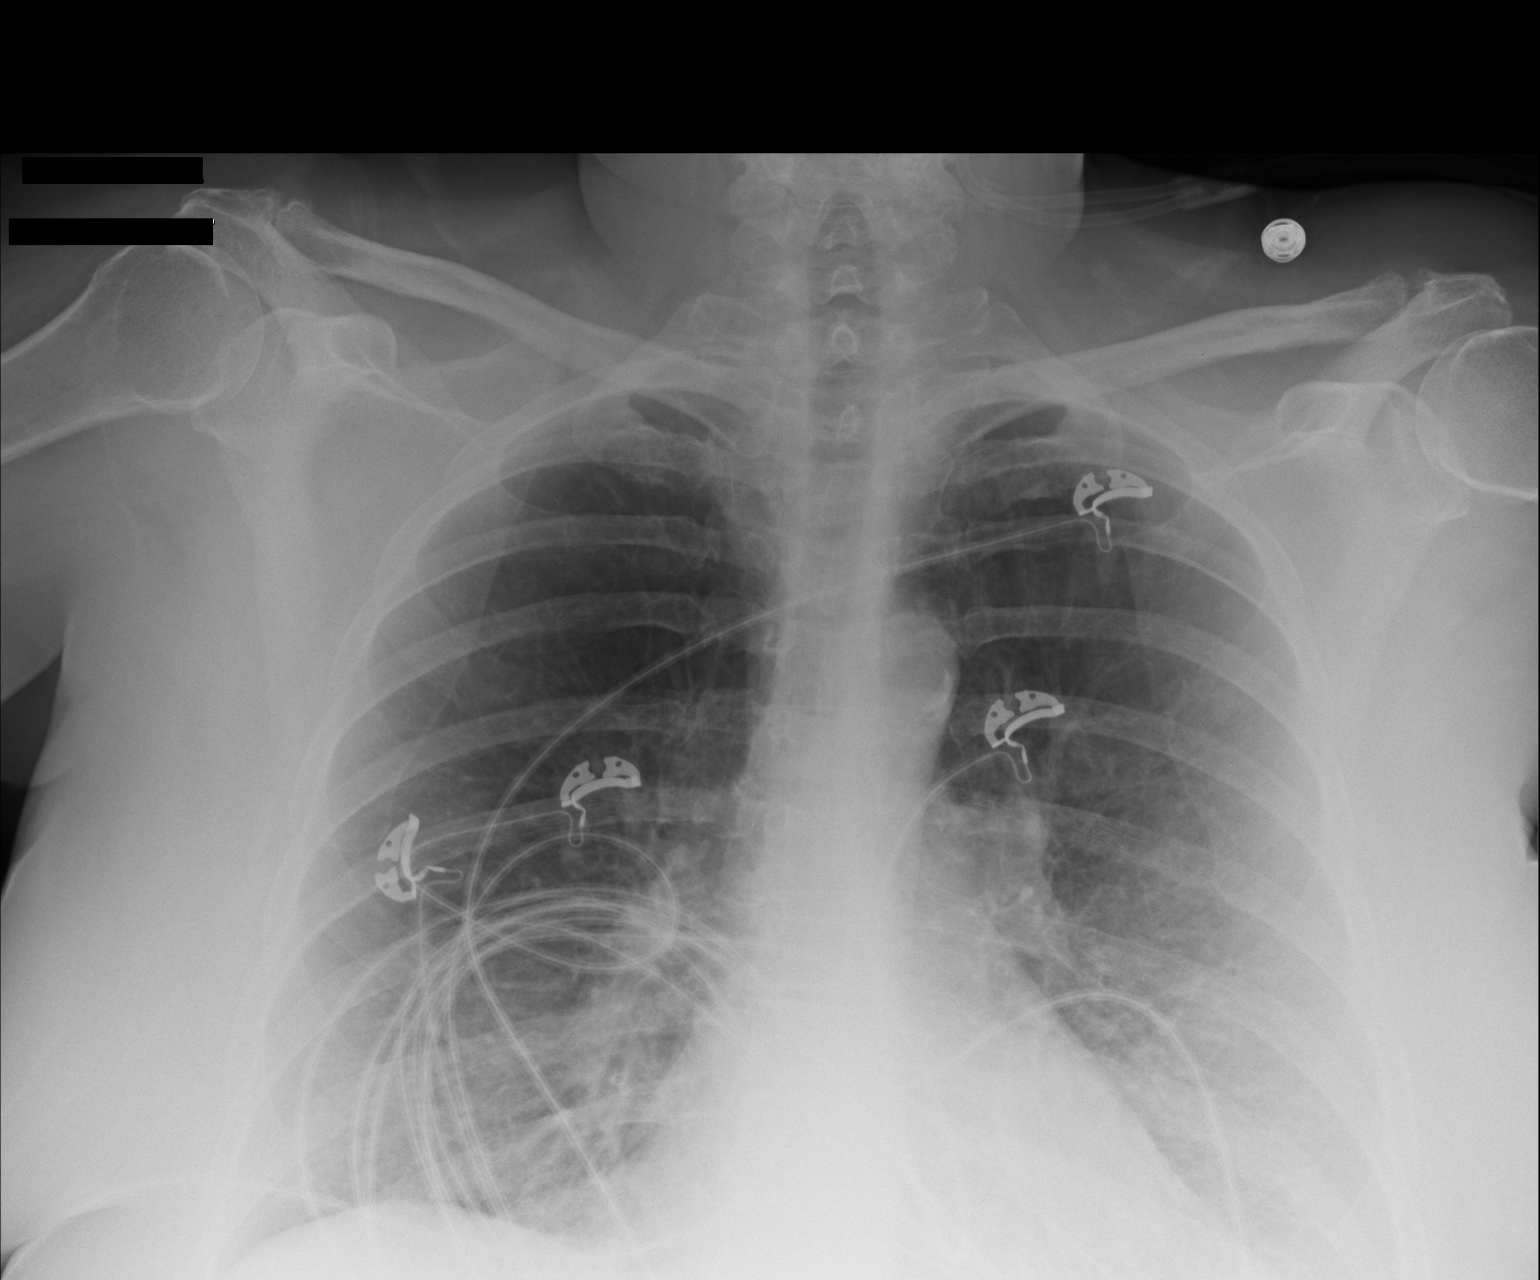

[AP (2 of 2)]
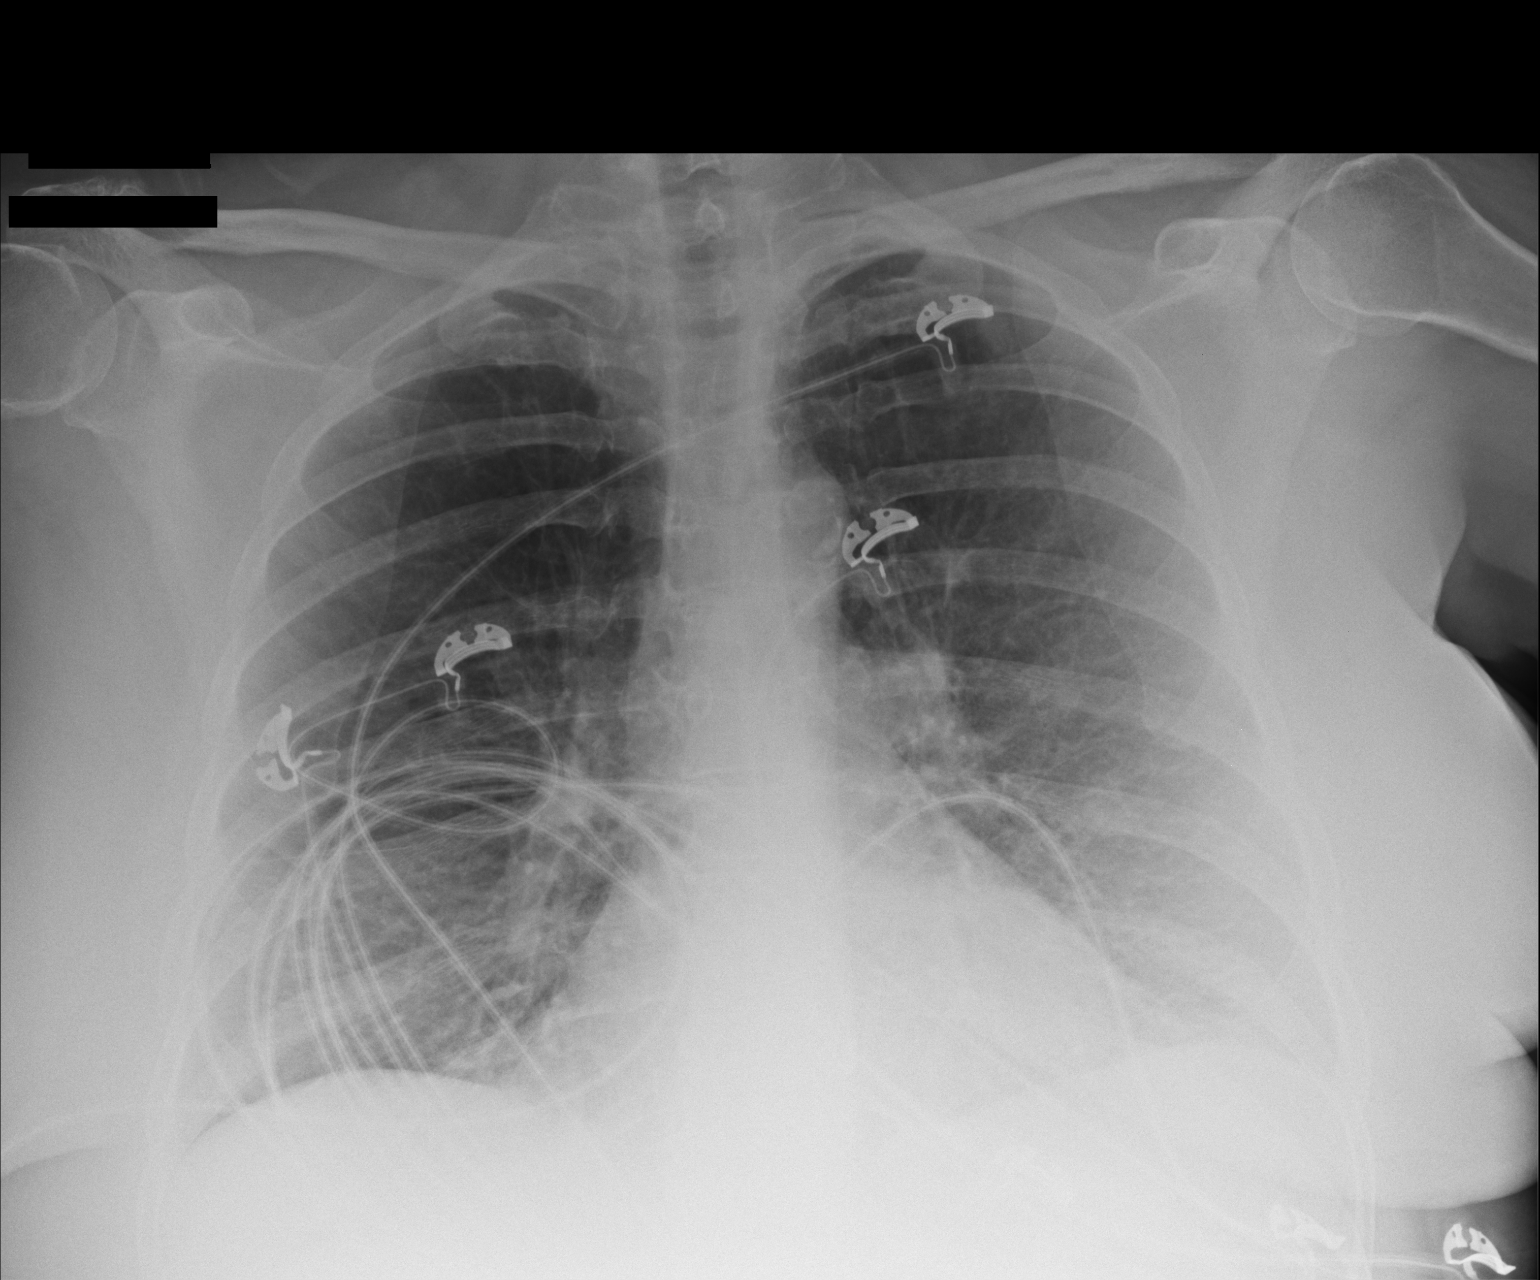

[2 of 2 positions shown; findings below may reference images not displayed]

FINDINGS: The heart size and mediastinal contours are within normal limits. No
pneumothorax or pleural effusion is noted. Stable bibasilar
interstitial densities are noted most consistent with scarring. No
acute pulmonary disease is noted. The visualized skeletal structures
are unremarkable.
IMPRESSION: No acute cardiopulmonary abnormality seen.

## 2016-02-10 IMAGING — DX DG CHEST 1V PORT
1 series · 1 of 1 positions shown · non-contrast
Comparison: 01/07/2015

CLINICAL DATA: SOB,HX COPD

EXAM:
PORTABLE CHEST 1 VIEW

[chest ap]
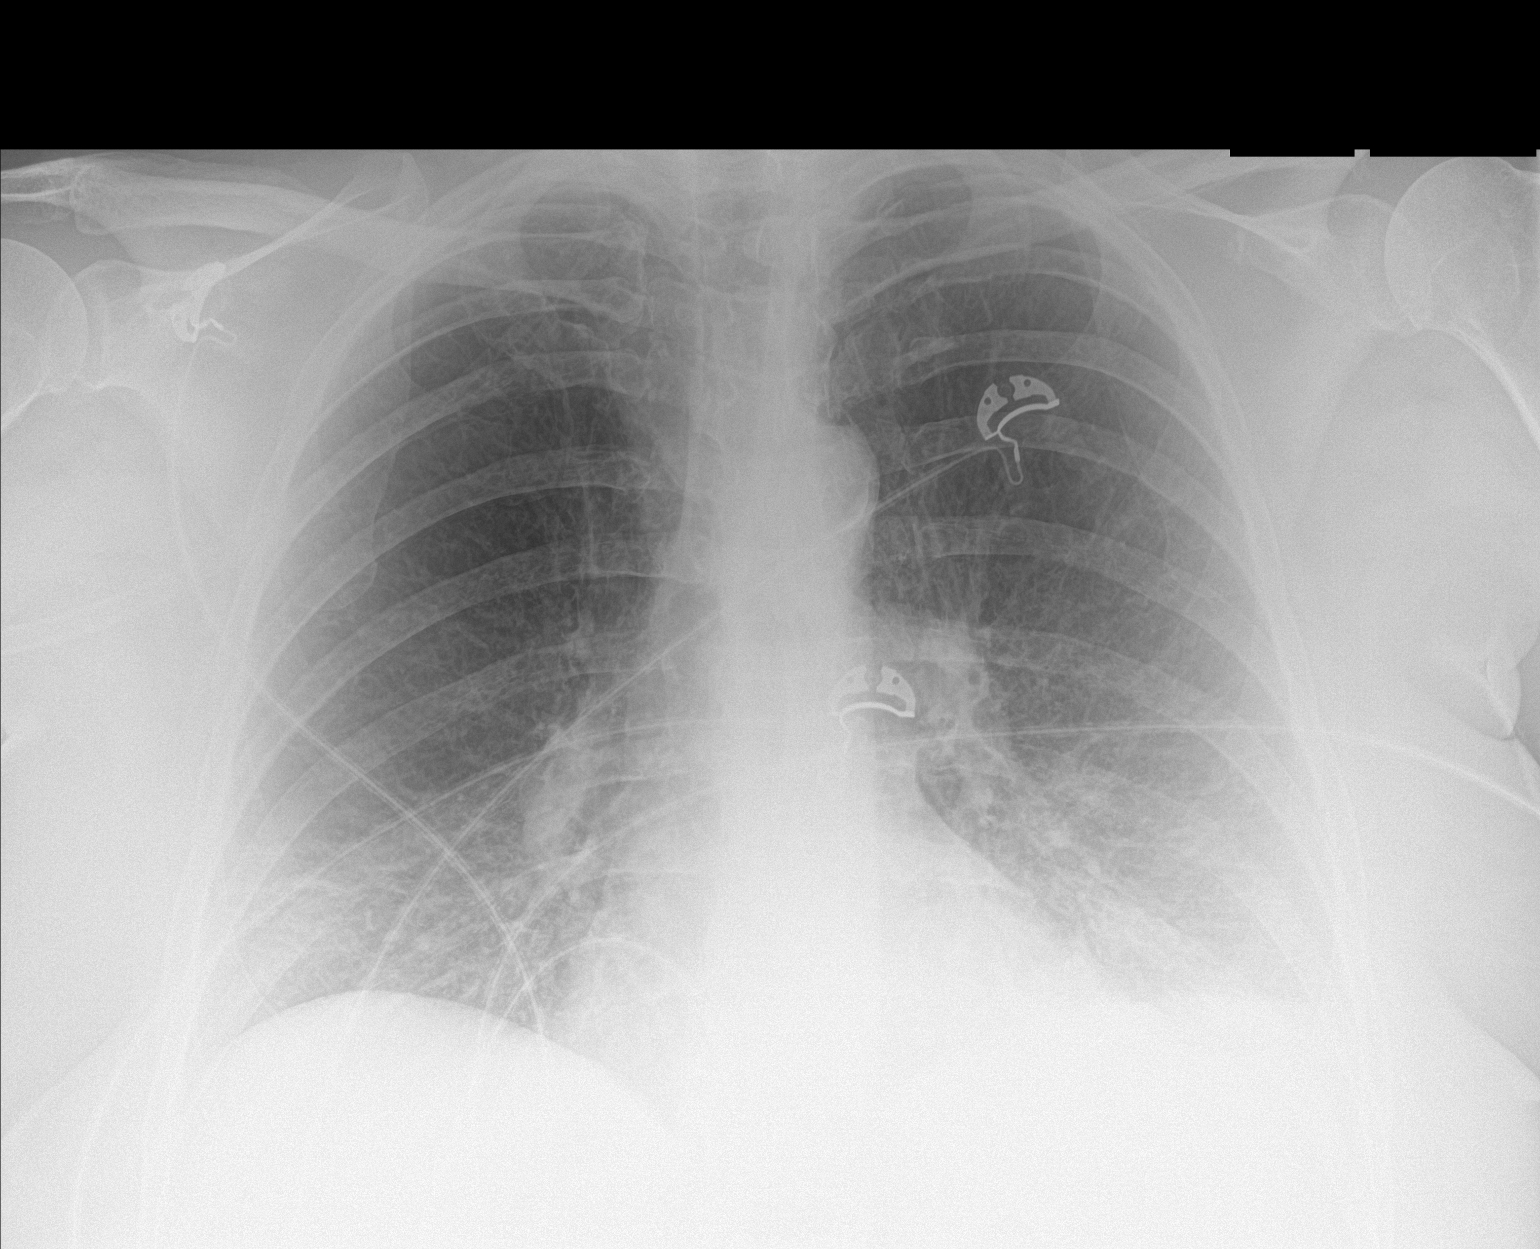

[1 of 1 positions shown; findings below may reference images not displayed]

FINDINGS: Midline trachea. Normal heart size. Atherosclerosis in the
transverse aorta. No right and no definite left pleural effusion. No
pneumothorax. Increased density projecting over the lung bases,
greater on the left. Favored to be due to overlying breast tissues.
Interstitial thickening is lower lobe predominant. No lobar
consolidation.
IMPRESSION: No acute cardiopulmonary disease.

degraded evaluation of the inferior chest, secondary to overlying
breast tissues.

Peribronchial thickening which may relate to chronic bronchitis or
smoking.

Atherosclerosis.

## 2016-02-16 NOTE — Addendum Note (Signed)
Addended by: Abigail MiyamotoPHELPS, Corbin Hott D on: 02/16/2016 05:17 PM   Modules accepted: Orders

## 2016-03-14 IMAGING — DX DG CHEST 2V
2 series · 2 of 2 positions shown · non-contrast
Comparison: 01/24/2015

CLINICAL DATA: Wheezing

EXAM:
CHEST  2 VIEW

[chest lat]
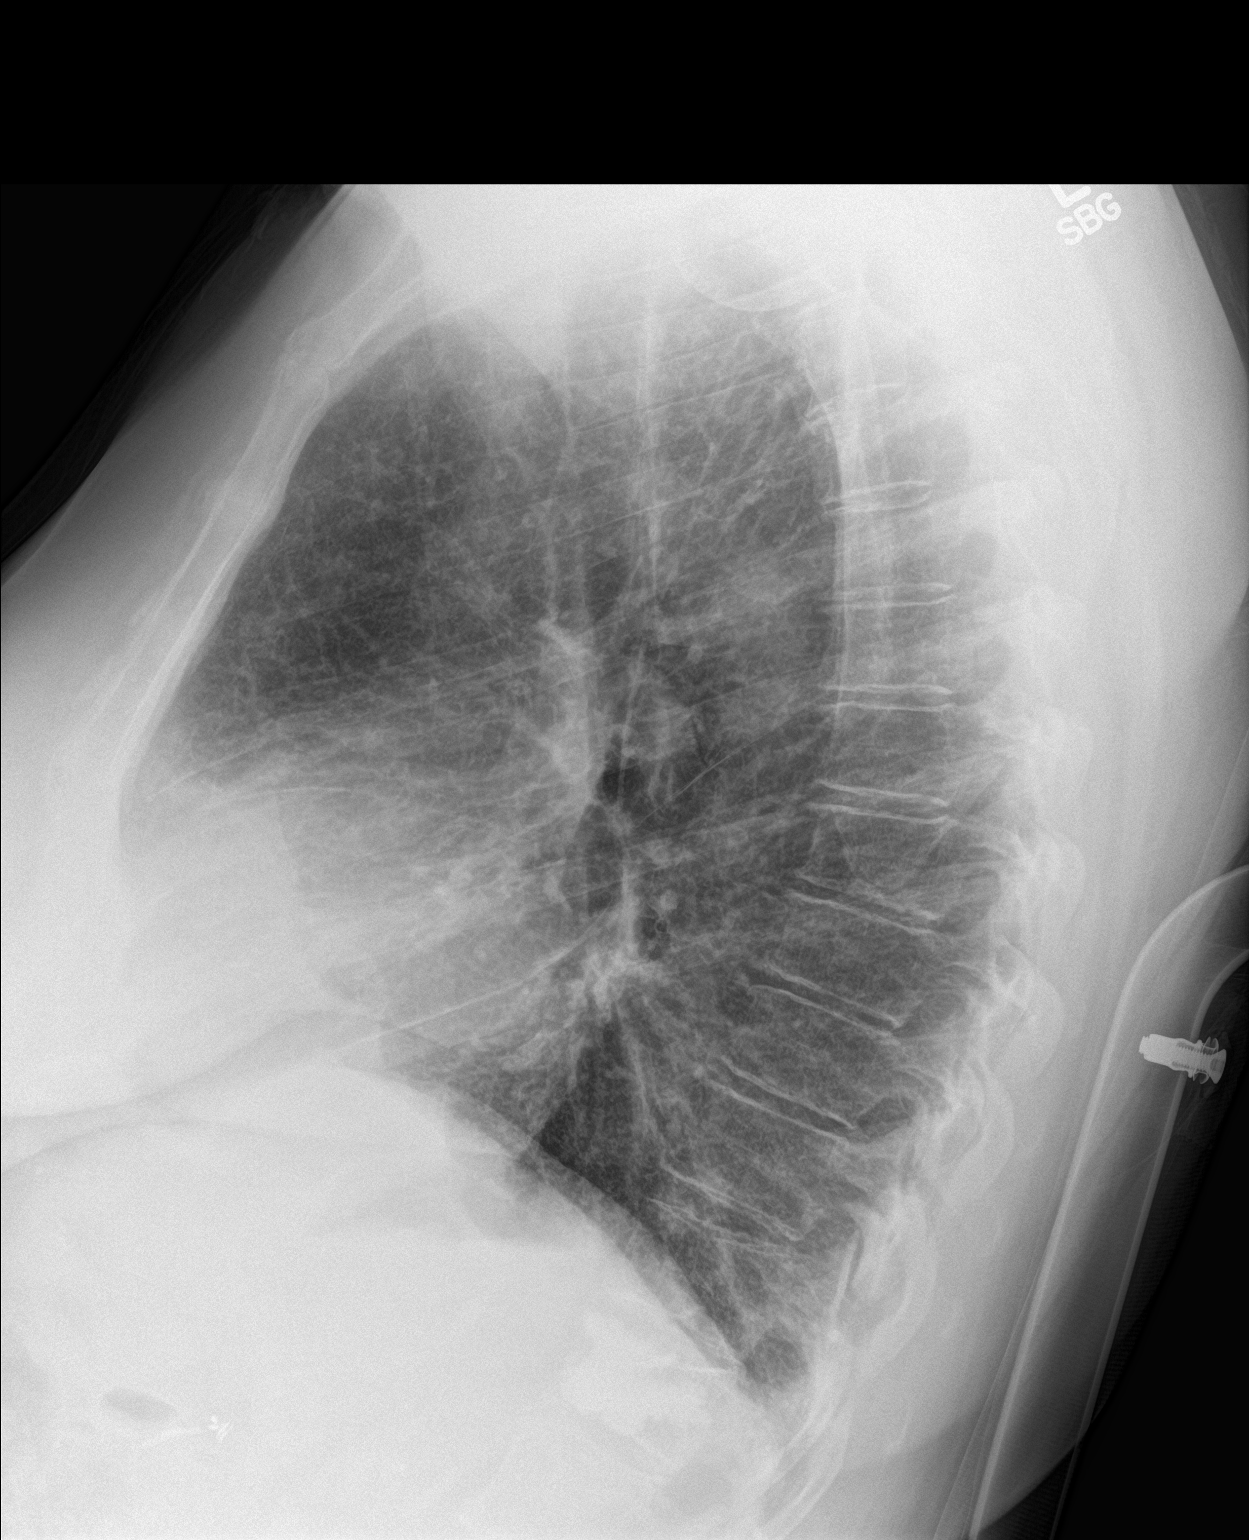

[chest ap]
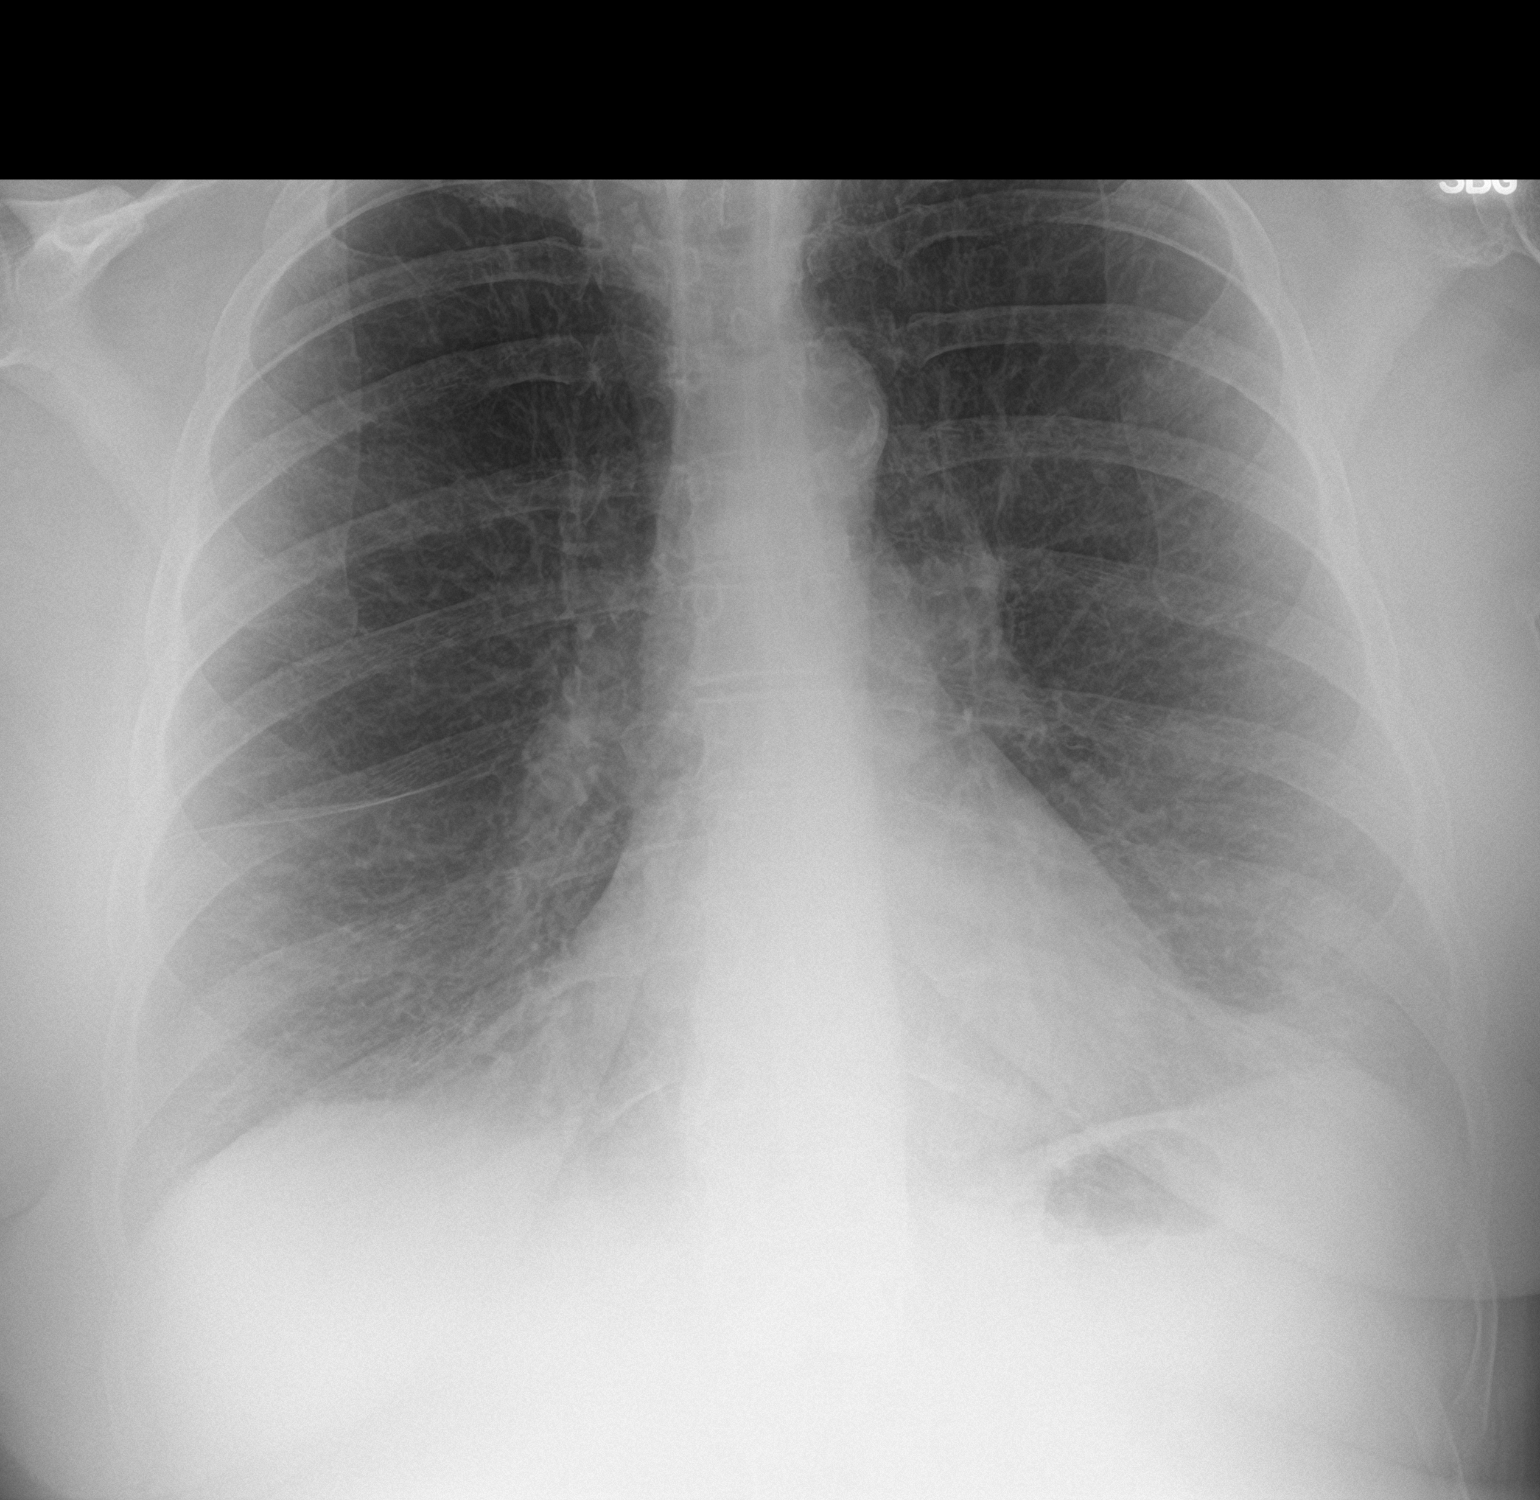

[2 of 2 positions shown; findings below may reference images not displayed]

FINDINGS: Lungs are hyperaerated and clear. Normal heart size. Status post L1
kyphoplasty. No pneumothorax.
IMPRESSION: No active cardiopulmonary disease.

## 2016-03-23 ENCOUNTER — Ambulatory Visit: Payer: Medicare Other | Attending: Family Medicine | Admitting: Audiology

## 2016-03-23 DIAGNOSIS — H903 Sensorineural hearing loss, bilateral: Secondary | ICD-10-CM | POA: Diagnosis not present

## 2016-03-23 DIAGNOSIS — H9313 Tinnitus, bilateral: Secondary | ICD-10-CM | POA: Insufficient documentation

## 2016-03-23 DIAGNOSIS — H93299 Other abnormal auditory perceptions, unspecified ear: Secondary | ICD-10-CM | POA: Insufficient documentation

## 2016-03-23 DIAGNOSIS — Z822 Family history of deafness and hearing loss: Secondary | ICD-10-CM

## 2016-03-23 DIAGNOSIS — R296 Repeated falls: Secondary | ICD-10-CM | POA: Insufficient documentation

## 2016-03-23 NOTE — Procedures (Signed)
Outpatient Audiology and [facility]  [address]  [place], [place] [address]  [phone]   Audiological Evaluation  Patient Name: Patricia Wall   Status: Outpatient   DOB: 01-27-1951    Diagnosis: Hearing Loss                Abnormal hearing screen MRN: [id number] Date:  03/23/2016     Referent: [name], MD  History: Patricia Wall was seen for an audiological evaluation. Accompanied by: Her daughter Primary Concern: Failed recent hearing screen. Trouble hearing at times and on the telephone. History of hearing problems: Y - especially the past 5 years. History of ear infections:  Y / N History of ear surgery or "tubes" : N History of dizziness/vertigo:   Y - happens every day, often while sitting still - lasts seconds, but contributes to unsteadiness and the family feels it is a fall risk. History of balance issues:  Y - "medication was considered a contributor" because Patricia Wall has "low blood pressure" - medication was recently discontinued "which seems to have helped a lot".  Tinnitus: Y - On a scale of 1 (no problem) to 10 (life altering), Patricia Wall rates the tinnitus as "7".  Patricia Wall states that tinnitus has fluctuating loudness but it is always bothersome and high pitched. The tinnitus is "so annoying that is distracts Patricia Wall from being able to read".  History of diabetes: Y - recently lost 35 pounds and no longer uses injections, only metformin tablet. Family history of hearing loss:  Y - brothers and sisters develop deafness in one or both ears and/or become hard of hearing in their 77'5 or 40's.  Other concerns: "Swishing noise in ears when turns head rapidly".     Evaluation: Conventional pure tone audiometry from 250Hz  - 8000Hz  with using insert earphones. Symmetrical hearing threshold of 30-35 dBHL from 250Hz  - 3000Hz ; 45 dBHL at 4000hz  and 50-55 dBHl from 6000Hz  - 8000Hz . The hearing loss is sensorineural. Reliability is good Speech reception levels  (repeating words near threshold) using recorded spondee word lists:  Right ear: 40 dBHL.  Left ear:  45 dBHL Word recognition (at comfortably loud volumes) using recorded NU-6 word lists, in quiet.  Right ear: 80% at 80 dBHL.  Left ear:   76% at 85 dBHL. Word recognition in minimal background noise:  +5 dBHL  Right ear: 38%                              Left ear:  <10%  Tympanometry (middle ear function) shows normal middle ear volume, pressure and compliance (Type A) with present with ipsilateral acoustic reflexes of 85-90 dB from 500Hz  - 4000Hz  bilaterally.  Tinnitus matching appeared to be approximately 8000Hz  using pure tone 75-85 dBHL bilaterally.  Suppression reliability is questionable because tinnitus was reportedly  not present at the time of testing.   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 This amount of hearing loss will adversely affect speech communication at normal conversational speech levels.   Word recognition is good to borderline fair in the right ear and fair in the left ear in quiet at conversational speech levels bilaterally. In minimal background noise, word recognition drops to poor in the right ear and is extremely poor in the left ear.  There is also some recruitment on the left side.  There is no sign of retrocochlear issues in either ear which was evaluated because of the tinnitus - middle ear function and acoustic reflexes are within normal limits bilaterally.   The tinnitus is "loud" measured at 75-85 dB, but is reportedly sometimes louder and higher pitched.  Of concern is that the tinnitus  is interfering with Patricia Wall ability to concentrate and to read.  Close monitoring of Patricia Wall' hearing, balance, a referral to an ENT, and a hearing aid evaluation with consideration of a tinnitus masker are recommended. Please note that the  family states that a hearing aid is not affordable unless insurance pays for it.  Finally, evaluate to rule out BPPV by a physical therapist at [facility] is strongly recommended.  In addition the paper word for captioned telephone, free of charge to those with hearing loss was initiated.    RECOMMENDATIONS: 1.   Monitor hearing closely with a repeat audiological evaluation in months (earlier if there is any change in hearing or ear pressure).  This appointment has been scheduled August 23, 2015 at 1pm. 2.   Referral to [facility]rapist to evaluate BPPV and balance since Patricia Wall has had "many falls" some of which included "head injury".  3.   Consider further evaluation of the tinnitus because it "interferes with concentration" and for the "swooshing sound reported by Patricia Wall "in her ears when she "moves her head" by an Ear, Nose and Throat physician.   4.   To minimize the adverse effects of tinnitus 1) avoid quiet  2) use noise maskers at home such as a sound machine, quiet music, a fan or other background noise at a volume just loud enough to mask the high pitched tinnitus. 3) If the tinnitus becomes more bothersome, adversely affecting your sleep or concentration, contact your physician. 5.  Strategies that help improve hearing include: A) Face the speaker directly. Optimal is having the speakers face well - lit.  Unless amplified, being within 3-6 feet of the speaker will enhance word recognition. B) Avoid having the speaker back-lit as this will minimize the ability to use cues from lip-reading, facial expression and gestures. C)  Word recognition is poorer in background noise. For optimal word recognition, turn off the TV, radio or noisy fan when engaging in conversation. In a restaurant, try to sit away from noise sources and close to the primary speaker.        D)  Ask for topic clarification from time to time in order to remain in the conversation.  Most  people don't mind repeating or clarifying a point when asked.  If needed, explain the difficulty hearing in background noise or hearing loss.  Deborah L. Kate SableWoodward, Au.D., CCC-A Doctor of Audiology\ 03/23/2016   cc: Willow OraANDY,CAMILLE L, MD

## 2016-04-28 ENCOUNTER — Encounter: Payer: Self-pay | Admitting: Internal Medicine

## 2016-04-28 ENCOUNTER — Ambulatory Visit (INDEPENDENT_AMBULATORY_CARE_PROVIDER_SITE_OTHER): Payer: Medicare Other | Admitting: Internal Medicine

## 2016-04-28 VITALS — BP 102/70 | HR 120 | Ht 64.0 in | Wt 180.6 lb

## 2016-04-28 DIAGNOSIS — J9611 Chronic respiratory failure with hypoxia: Secondary | ICD-10-CM | POA: Diagnosis not present

## 2016-04-28 DIAGNOSIS — F17210 Nicotine dependence, cigarettes, uncomplicated: Secondary | ICD-10-CM | POA: Diagnosis not present

## 2016-04-28 DIAGNOSIS — J449 Chronic obstructive pulmonary disease, unspecified: Secondary | ICD-10-CM

## 2016-04-28 MED ORDER — PREDNISONE 10 MG PO TABS: ORAL_TABLET | ORAL | 0 refills | Status: DC

## 2016-04-28 NOTE — Progress Notes (Signed)
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 rec Prilosec Take 30-60 min before first meal of the day  Stop all inhalers except ventolin and use it up to 2 puffs every 4 hours if can't catch your breath Anoro open once and take two dep drags each am as soon as you get up each day    07/25/2014  Acute  ov/Patricia Wall re: aecopd/ GOLD II  On coreg/ using top part of med calendar but not the prns at bottom  Chief Complaint  Patient presents with  . Acute Visit    prod cough w/green mucus; passing out when coughing; chest congestion; chest tightness; SOB at all times  was doing fine on breo then gradually  worse since changed to symbicort with poor hfa  Cough /breathing worse in am's, has sense of sob even at rest/ harsh upper airway quality coughing  rec zpak Prednisone 10 mg take  4 each am x 2 days,   2 each am x 2 days,  1 each am x 2 days and stop  Reduce the carevdilol to one-half twice daily  For cough mucinex dm up to 1200 mg every 12 hours and the flutter valve  See Patricia Wall w/in 4 weeks> DID NOT DO     01/30/2015 Post Hospital follow up : GOLD II COPD  Patient returns for a post hospital follow-up Patient was admitted October 19 through October 25 for a COPD exacerbation and decompensated diastolic heart failure. She required initial BiPAP support. She was treated with IV  antibiotics, steroids, and nebulized bronchodilators.. She was discharged on a prednisone taper.  Patient says since discharge she has continued to feel weak. She did have a fall and [facility]. She says x-rays and CT were done with no fracture. She did require staples to a scalp laceration.  Patient is on daily narcotics for chronic pain along with gabapentin and muscle relaxers.  She denies any hemoptysis, orthopnea, PND, leg swelling, or fever. Says that she continues to feel weak. Cough and shortness of breath or slightly better. She does get up some white to yellow mucus. Intermittently. She remains on Spiriva and Symbicort. Says that she has not smoked since discharge. rec Continue on Symbicort 2 puffs Twice daily  .   Continue on Spiriva 1 puff daily      02/10/2015 acute extended ov/Patricia Wall re: GOLD II criteria/ 02 dep 2lpm 24/7  Chief Complaint  Patient presents with  . Acute Visit    Pt states that her breathing has progressively worsened since her last visit. She states that she has been wheezing alot and sats have been low with exertion. She is using albuterol inhaler 3 x per day on average.  2lpm 24/7 / symb/spiriva dpi with mostly dry cough  rec Plan A = Automatic = symbicort 160 2 every 12 hours                                     spiriva 2 pffs each am  Plan B = Backup - Only use your albuterol as a rescue medication  Plan C= crisis - only use nebulizer after you try Plan B if it fails Prednisone 10 mg take  4 each am x 2 days,   2 each am x 2 days,  1 each am x 2 days  And a half for 2 days and stop (or to repeat)   Remember to use the flutter valve especially in am       10/08/2015  f/u ov/Patricia Wall re:  GOLD II copd/ 02 2lpm 24/7 spiriva/symbicort  Resumed smoking  Chief Complaint  Patient presents with  . Follow-up    Breathing is overall doing well. She is using albuterol inhaler 2-3 x per wk on average.   Congested cough esp in  am/ very limited by back but not by breathing/ not using flutter valve as rec  rec No changes needed but remember your flutter valve and use it as much as you can  The key is to stop smoking completely before smoking completely stops you. See calendar Separating the top medications from the bottom group is fundamental to providing you adequate care going forward.       01/11/2016  f/u ov/Patricia Wall re: copd/ still smoking maint rx symb 160/spiriva respimat  Chief Complaint  Patient presents with  . Follow-up    Pt c/o increased cough and SOB for the past 2 wks. She is coughing up yellow sto brown sputum and has had green nasal d/c. She uses albuterol inhaler at leasr once per day on average.   new cp on L post radiates anteriorly around C3-5 level   started after she coughed hard and every time she coughs since  Comfortable at rest p saba rx / has both saba neb and hfa and confused when to use which one  Assoc with flare of nasal congestion/ purulent drainage s sinus pain /tootchaches >>Zpack /Pred , CXR nad     01/27/2016 Follow up : COPD /Med review  Patient returns for a two-week follow-up and medication review. Last visit. Patient was having a COPD exacerbation. He was given a Z-Pak and prednisone taper. Since last visit. Patient is feeling better w/ less cough and dyspnea. . Cutting way back on smoking . CXR last ov w /nad.  rec Follow med calendar closely and bring to each visit.  Continue on current regimen .  Work on quitting smoking     04/28/2016  f/u ov/Patricia Wall re:  GOLD II/ still smokng / 02 2lpm 24/7 - using med calendar well  Chief Complaint  Patient presents with  . Follow-up    Pt states her breathing is progressively worsening. She states her cough has been worse for the past 3 wks- prod with clear sputum. She also c/o chest tightness. She is still smoking- 5 cigs per day. She is using albuterol inhaler 2-3 x per wk and neb with albuterol about every other day on average.    [facility]  Mucus is clear, not keeping her up or early am exac  Better with saba  No obvious day to day or daytime variability or assoc  purulent sputum or mucus plugs or hemoptysis or cp or , subjective wheeze or overt sinus or hb symptoms. No unusual exp hx or h/o childhood pna/ asthma or knowledge of premature birth.  Sleeping ok without nocturnal  or early am exacerbation  of respiratory  c/o's or need for noct saba. Also denies any obvious fluctuation of symptoms with weather or environmental changes or other aggravating or alleviating factors except as outlined above   Current Medications, Allergies, Complete Past Medical History, Past Surgical History, Family History, and Social History were reviewed in [employer] record.  ROS  The following are not active complaints unless bolded sore throat, dysphagia, dental problems, itching, sneezing,  nasal congestion or excess/ purulent secretions, ear ache,   fever, chills, sweats, unintended wt loss, classically pleuritic or exertional cp,  orthopnea pnd or leg swelling, presyncope, palpitations, abdominal pain, anorexia, nausea, vomiting, diarrhea  or change in bowel or bladder habits, change in stools or urine, dysuria,hematuria,  rash, arthralgias, visual complaints, headache, numbness, weakness or ataxia or problems with walking or coordination,  change in mood/affect or memory.                 Objective:  Physical Exam   w/c bound wf nad  vital signs reviewed  - Note on arrival 02 sats  93% on 2l/m      04/02/2013  198  >   10/02/2013   207 >  201 10/15/13 > 12/30/2013  207 >209 01/27/2014 > 05/02/2014 215>211 05/30/14 > 07/25/2014   214 > 10/31/2014  221 >213 12/09/14 >205 01/30/2015 > 02/10/2015    202 > 04/02/2015  206 >   10/08/2015   182 > 01/11/2016 177 >  04/28/2016  181     HEENT mild bilateral non-specific  turbinate edema.  Edentulous   / oropharynx clear   Nl external ear  canals without cough reflex  NECK :  without JVD/Nodes/TM/ nl carotid upstrokes bilaterally  LUNGS: no acc muscle use, diminshed BS in bases ,  distant BS with minimal exp rhonchi    CV:  RRR  no s3 or murmur or increase in P2, no edema   ABD:  soft and nontender with nl excursion in the supine position. No bruits or organomegaly, bowel sounds nl  MS:  warm without deformities, calf tenderness, cyanosis or clubbing  SKIN: warm and dry without lesions   NEURO:  alert, approp, no deficits           Assessment:

## 2016-04-28 NOTE — Patient Instructions (Addendum)
Prednisone 10 mg take  4 each am x 2 days,   2 each am x 2 days,  1 each am x 2 days and stop   The key is to stop smoking completely before smoking completely stops you!   See calendar for specific medication instructions and bring it back for each and every office visit for every healthcare provider you see.  Without it,  you may not receive the best quality medical care that we feel you deserve.  You should use the 4 slots on your pill organizer for 1) before bfast 2)bfast 3) supper and 4) bedtime   You will note that the calendar groups together  your maintenance  medications that are timed at particular times of the day.  Think of this as your checklist for what your doctor has instructed you to do until your next evaluation to see what benefit  there is  to staying on a consistent group of medications intended to keep you well.  The other group at the bottom is entirely up to you to use as you see fit  for specific symptoms that may arise between visits that require you to treat them on an as needed basis.  Think of this as your action plan or "what if" list.   Separating the top medications from the bottom group is fundamental to providing you adequate care going forward.

## 2016-05-01 NOTE — Assessment & Plan Note (Signed)
>   3 min Discussed the risks and costs (both direct and indirect)  of smoking relative to the benefits of quitting but patient unwilling to commit at this point to a specific quit date.    Although I don't endorse regular use of e cigs/ many pts find them helpful; however, I emphasized they should be considered a "one-way bridge" off all tobacco products.  

## 2016-05-01 NOTE — Assessment & Plan Note (Signed)
-   See ono 09/29/13 > desat x 54 min < 89% > rec 2lpm   Adequate control on present rx, reviewed in detail with pt > no change in rx needed  >> As of 04/28/2016 rec 2lpm 24/7

## 2016-05-01 NOTE — Assessment & Plan Note (Signed)
-   pft's 12/12/12 FEV1  1.41 (53%) ratio 54 with no significant reversibility and DLCO 47% corrects to 50 01/16/2013  Walked RA x 2 laps @ 185 ft each stopped due to sob, no desat     - Trial of anoro 01/16/2013 > helped but insurance would not pay - incruse one daily started 10/02/2013 >>>not covered  -Spiriva restarted 10/15/13 > [facility] 12/30/13 but not consistent with it  10/15/13 Med calendar , 01/27/2014 > lost it at f/u 05/02/14 ,  Did not understand how to use it at f/u ov 07/25/14  05/30/14 refer to pulm rehab  - 01/11/2016  extensive coaching HFA effectiveness =    90%  Mild flare in active smoker but managing her problem well with action list provided   rec Prednisone 10 mg take  4 each am x 2 days,   2 each am x 2 days,  1 each am x 2 days and stop but not abx at this point    Each maintenance medication was reviewed in detail including most importantly the difference between maintenance and as needed and under what circumstances the prns are to be used. This was done in the context of a medication calendar review which provided the patient with a user-friendly unambiguous mechanism for medication administration and reconciliation and provides an action plan for all active problems. It is critical that this be shown to every doctor  for modification during the office visit if necessary so the patient can use it as a working document.

## 2016-06-20 ENCOUNTER — Other Ambulatory Visit: Payer: Self-pay | Admitting: Internal Medicine

## 2016-06-20 DIAGNOSIS — J441 Chronic obstructive pulmonary disease with (acute) exacerbation: Secondary | ICD-10-CM

## 2016-07-19 ENCOUNTER — Other Ambulatory Visit: Payer: Self-pay | Admitting: Internal Medicine

## 2016-07-26 ENCOUNTER — Ambulatory Visit (INDEPENDENT_AMBULATORY_CARE_PROVIDER_SITE_OTHER)
Admission: RE | Admit: 2016-07-26 | Discharge: 2016-07-26 | Disposition: A | Discharge status: Home / Self Care | Payer: Medicare Other | Source: Ambulatory Visit | Attending: Internal Medicine | Admitting: Internal Medicine

## 2016-07-26 ENCOUNTER — Ambulatory Visit (INDEPENDENT_AMBULATORY_CARE_PROVIDER_SITE_OTHER): Payer: Medicare Other | Admitting: Internal Medicine

## 2016-07-26 ENCOUNTER — Encounter: Payer: Self-pay | Admitting: Internal Medicine

## 2016-07-26 VITALS — BP 92/58 | HR 114 | Ht 65.0 in | Wt 184.0 lb

## 2016-07-26 DIAGNOSIS — J449 Chronic obstructive pulmonary disease, unspecified: Secondary | ICD-10-CM

## 2016-07-26 DIAGNOSIS — J9611 Chronic respiratory failure with hypoxia: Secondary | ICD-10-CM

## 2016-07-26 DIAGNOSIS — J441 Chronic obstructive pulmonary disease with (acute) exacerbation: Secondary | ICD-10-CM

## 2016-07-26 DIAGNOSIS — F17210 Nicotine dependence, cigarettes, uncomplicated: Secondary | ICD-10-CM

## 2016-07-26 MED ORDER — PREDNISONE 10 MG PO TABS: ORAL_TABLET | ORAL | 0 refills | Status: DC

## 2016-07-26 MED ORDER — AZITHROMYCIN 250 MG PO TABS: ORAL_TABLET | ORAL | 0 refills | Status: DC

## 2016-07-26 NOTE — Progress Notes (Signed)
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 rec Prilosec Take 30-60 min before first meal of the day  Stop all inhalers except ventolin and use it up to 2 puffs every 4 hours if can't catch your breath Anoro open once and take two dep drags each am as soon as you get up each day    07/25/2014  Acute  ov/Nickola Lenig re: aecopd/ GOLD II  On coreg/ using top part of med calendar but not the prns at bottom  Chief Complaint  Patient presents with  . Acute Visit    prod cough w/green mucus; passing out when coughing; chest congestion; chest tightness; SOB at all times  was doing fine on breo then gradually  worse since changed to symbicort with poor hfa  Cough /breathing worse in am's, has sense of sob even at rest/ harsh upper airway quality coughing  rec zpak Prednisone 10 mg take  4 each am x 2 days,   2 each am x 2 days,  1 each am x 2 days and stop  Reduce the carevdilol to one-half twice daily  For cough mucinex dm up to 1200 mg every 12 hours and the flutter valve  See Tammy NP w/in 4 weeks> DID NOT DO     04/28/2016  f/u ov/Zanovia Rotz re:  GOLD II/ still smokng / 02 2lpm 24/7 - using med calendar well  Chief Complaint  Patient presents with  . Follow-up    Pt states her breathing is progressively worsening. She states her cough has been worse for the past 3 wks- prod with clear sputum. She  also c/o chest tightness. She is still smoking- 5 cigs per day. She is using albuterol inhaler 2-3 x per wk and neb with albuterol about every other day on average.   does fine walking for a couple of aisles at [facility]  Mucus is clear, not keeping her up or early am exac  Better with saba  rec Prednisone 10 mg take  4 each am x 2 days,   2 each am x 2 days,  1 each am x 2 days and stop  The key is to stop smoking completely before smoking completely stops you!  See calendar for specific medication instructions    07/26/2016  f/u ov/Christia Domke re: copd II / still smoking on 2lpm 24/7 no med calendar / not able to confirm med reconciliation  Chief Complaint  Patient presents with  . Follow-up    Pt c/o increased SOB and cough x 2 wks. Her cough is prod with large amounts of light brown sputum.  She is using her albuterol inhaler 4-5 x per wk on average and rarely uses neb.   no change doe = MMRC3 = can't walk 100  yards even at a slow pace at a flat grade s stopping due to sob  On 2lpm 24/7  Brown mucus mostly in am x 2 weeks prior to OV    No obvious day to day or daytime variability or assoc  mucus plugs or hemoptysis or cp or chest tightness, subjective wheeze or overt sinus or hb symptoms. No unusual exp hx or h/o childhood pna/ asthma or knowledge of premature birth.  Sleeping ok without nocturnal  or early am exacerbation  of respiratory  c/o's or need for noct saba. Also denies any obvious fluctuation of symptoms with weather or environmental changes or other aggravating or alleviating factors except as outlined above   Current Medications, Allergies, Complete Past Medical History, Past Surgical History, Family History, and Social History were reviewed in [facility] electronic [facility] record.  ROS  The following are not active complaints unless bolded sore throat, dysphagia, dental problems, itching, sneezing,  nasal congestion or excess/ purulent secretions, ear ache,   fever, chills, sweats,  unintended wt loss, classically pleuritic or exertional cp,  orthopnea pnd or leg swelling, presyncope, palpitations, abdominal pain, anorexia, nausea, vomiting, diarrhea  or change in bowel or bladder habits, change in stools or urine, dysuria,hematuria,  rash, arthralgias, visual complaints, headache, numbness, weakness or ataxia or problems with walking or coordination,  change in mood/affect or memory.                     Objective:  Physical Exam  amb wf (with rolator)  nad  vital signs reviewed  - Note on arrival 02 sats  92% on 2lpm NP     04/02/2013  198  >   10/02/2013   207 >  201 10/15/13 > 12/30/2013  207 >209 01/27/2014 > 05/02/2014 215>211 05/30/14 > 07/25/2014   214 > 10/31/2014  221 >213 12/09/14 >205 01/30/2015 > 02/10/2015    202 > 04/02/2015  206 >   10/08/2015   182 > 01/11/2016 177 >  04/28/2016  181 >  07/26/2016   184    HEENT mild bilateral non-specific  turbinate edema.  Edentulous   / oropharynx clear   Nl external ear canals without cough reflex  NECK :  without JVD/Nodes/TM/ nl carotid upstrokes bilaterally  LUNGS: no acc muscle use, diminshed BS in bases ,  distant BS with minimal bilateral mid exp rhonchi    CV:  RRR  no s3 or murmur or increase in P2, no edema   ABD:  soft and nontender with nl excursion in the supine position. No bruits or organomegaly, bowel sounds nl  MS:  warm without deformities, calf tenderness, cyanosis or clubbing  SKIN: warm and dry without lesions   NEURO:  alert, approp, no deficits    CXR PA and Lateral:   07/26/2016 :    I personally reviewed images and agree with radiology impression as follows:   No active cardiopulmonary disease.       Assessment:

## 2016-07-26 NOTE — Patient Instructions (Signed)
Prednisone 10 mg take  4 each am x 2 days,   2 each am x 2 days,  1 each am x 2 days and stop   zpak  For cough/ congestion > mucinex dm 1200 mg every 12 hours and use the flutter valve as much as you can   See Tammy NP in 3 months  with all your medications, even over the counter meds, separated in two separate bags, the ones you take no matter what vs the ones you stop once you feel better and take only as needed when you feel you need them.   Tammy  will generate for you a new user friendly medication calendar that will put us all on the same page re: your medication use.     Without this process, it simply isn't possible to assure that we are providing  your outpatient care  with  the attention to detail we feel you deserve.   If we cannot assure that you're getting that kind of care,  then we cannot manage your problem effectively from this clinic.  Once you have seen Tammy and we are sure that we're all on the same page with your medication use she will arrange follow up with me.

## 2016-07-27 ENCOUNTER — Encounter: Payer: Self-pay | Admitting: Internal Medicine

## 2016-07-27 NOTE — Assessment & Plan Note (Signed)
-   pft's 12/12/12 FEV1  1.41 (53%) ratio 54 with no significant reversibility and DLCO 47% corrects to 50 01/16/2013  Walked RA x 2 laps @ 185 ft each stopped due to sob, no desat     - Trial of anoro 01/16/2013 > helped but insurance would not pay - incruse one daily started 10/02/2013 >>>not covered  -Spiriva restarted 10/15/13 > [facility] 12/30/13 but not consistent with it  10/15/13 Med calendar , 01/27/2014 > lost it at f/u 05/02/14 ,  Did not understand how to use it at f/u ov 07/25/14  05/30/14 refer to pulm rehab  - 01/11/2016  extensive coaching HFA effectiveness =    90%  No change in maintenance program needed at this point.  I had an extended discussion with the patient reviewing all relevant studies completed to date and  lasting 15 to 20 minutes of a 25 minute visit    Each maintenance medication was reviewed in detail including most importantly the difference between maintenance and prns and under what circumstances the prns are to be triggered using an action plan format that is not reflected in the computer generated alphabetically organized AVS but trather by a customized med calendar that reflects the AVS meds with confirmed 100% correlation.   In addition, Please see AVS for unique instructions that I personally wrote and verbalized to the the pt in detail and then reviewed with pt  by my nurse highlighting any  changes in therapy recommended at today's visit to their plan of care.

## 2016-07-27 NOTE — Assessment & Plan Note (Signed)
-   See ono 09/29/13 > desat x 54 min < 89% > rec 2lpm  - 07/26/2016 Patient Saturations on Room Air at Rest = 87%---increased to 92% on 2lpm pulsed o2   As of 07/26/2016 rec 2lpm 24/7

## 2016-07-27 NOTE — Progress Notes (Signed)
Spoke with pt and notified of results per Dr. Wert. Pt verbalized understanding and denied any questions. 

## 2016-07-27 NOTE — Assessment & Plan Note (Signed)
>   3 min This is becoming a matter of life or breath but she is still not committed to quit.

## 2016-08-22 ENCOUNTER — Ambulatory Visit: Payer: Medicare Other | Attending: Audiology | Admitting: Audiology

## 2016-08-22 DIAGNOSIS — H9313 Tinnitus, bilateral: Secondary | ICD-10-CM

## 2016-08-22 DIAGNOSIS — Z01110 Encounter for hearing examination following failed hearing screening: Secondary | ICD-10-CM | POA: Diagnosis present

## 2016-08-22 DIAGNOSIS — H903 Sensorineural hearing loss, bilateral: Secondary | ICD-10-CM

## 2016-08-22 DIAGNOSIS — R2689 Other abnormalities of gait and mobility: Secondary | ICD-10-CM | POA: Diagnosis present

## 2016-08-22 DIAGNOSIS — H93299 Other abnormal auditory perceptions, unspecified ear: Secondary | ICD-10-CM | POA: Diagnosis present

## 2016-08-22 NOTE — Procedures (Signed)
Outpatient Audiology and [facility]  [address]  [place], [place] [address]  [phone]  Audiological Evaluation  Patient Name: Patricia Wall                Status: Outpatient      DOB: June 03, 1950                                              Diagnosis: Hearing Loss                                                                                             Tinnitus, Balance concerns MRN: [id number] Date:  08/22/2016                                               Referent: [name], MD  History: Patricia Wall was seen for a repeat audiological evaluation. She was previously seen here on 03/23/2016 with a "symmetrical sensorineural hearing loss that ranges from mild in the low and mid range to moderately severe in the high frequency range".  "Word recognition is good to borderline fair in the right ear and fair in the left ear in quiet at conversational speech levels bilaterally. In minimal background noise, word recognition drops to poor in the right ear and is extremely poor in the left ear."  The tinnitus is "loud" measured at 75-85 dB, but is reportedly sometimes louder and higher pitched. Close monitoring of Patricia Wall' hearing, balance, a referral to an ENT, and a hearing aid evaluation with consideration of a tinnitus masker are recommended".  In addition, because of the balance concerns, referral for a balance assessment by a physical therapist at [facility] was strongly recommended.  Please note that although a request for a captioned telephone was initiated, she was not able to obtain one..    Accompanied by: Her daughter Primary Concern: Continued difficulty hearing in most situations and on the telephone. Tinnitus is "getting louder".  History of hearing problems: Y - especially the past 5 years. History of ear infections:  N History of ear surgery or "tubes" : N History of dizziness/vertigo:   Y - continues to happen every day, often while  sitting still - lasts seconds, but contributes to unsteadiness and the family feels it is a fall risk. History of balance issues:  Y - is concerned about falls.   Tinnitus: Y - On a scale of 1 (no problem) to 10 (life altering), Patricia Wall rates the tinnitus as "8".  Patricia Wall states that tinnitus Wall become louder and is always bothersome and high pitched. As mentioned previously, the tinnitus is "so annoying that is distracts Patricia Wall from being able to read".  History of diabetes: Y -    Evaluation: Conventional pure tone audiometry from 250Hz  - 8000Hz  with using insert earphones. Symmetrical hearing threshold of 30-40 DBHL from 250Hz  -  2000Hz ; 50 dBHL at 3000Hz ; (previously 30-35 dBHL from 250Hz  - 3000Hz ; 55 dBHL at 4000Hz  (previously 45 dBHL) and 60-65 dBHL (previously )50-55 dBHl from 6000Hz  - 8000Hz  bilaterally. The hearing loss is sensorineural.  Reliability is good Speech reception levels (repeating words near threshold) using recorded spondee word lists - have remained stable:   Right ear: 40 dBHL.   Left ear:  40 dBHL Word recognition (at comfortably loud volumes) using recorded NU-6 word lists, in quiet, Wall remained the same as on the previous evaluation.   Right ear: 80% at 80 dBHL.   Left ear:   76% at 85 dBHL.  Tinnitus matching appeared to be approximately 8000Hz  using pure tone 82 dBHL bilaterally. Tinnitus was present throughout testing. No suppression was observed.    CONCLUSION:      Patricia Wall continues to have a symmetrical sensorineural hearing loss that appears slightly poorer than 6 months ago, dropping from 5-10 dB at most frequencies. Patricia Wall currently Wall a mild to moderate  low and mid range frequency hearing loss dropping to a moderately severe loss in the high frequency range. The hearing loss appears sensorineural bilaterally.  This amount of hearing loss will adversely affect speech communication at normal conversational speech levels.      Word recognition is  stable compared to previous results with good to borderline fair in the right ear and fair in the left ear in quiet at very loud levels bilaterally. Previous results in minimal background noise show poor word recognition that is extremely poor in the left ear.   The tinnitus is "loud" measured at 82 dB at 8000Hz .       Referral to evaluate Patricia Wall's  Balance and Tinnitus with a referral to an ENT is strongly recommended.  In addition, hearing aid evaluation with consideration of a tinnitus masker is recommended. Per discussion with the family, the audiological results will be evaluated by [facility] to see if Patricia Wall qualified for a "free hearing aid" through [employer].  Please note that the family states that a hearing aid is not affordable unless insurance pays for it.      Finally, evaluate to rule out BPPV and balance by a physical therapist at [facility] is strongly recommended.     RECOMMENDATIONS: 1.   Referral to an ENT because a) hearing Wall dropped 5-10 dB when compared to the January 2018 results and b) to evaluate the tinnitus which Wall "become louder". 2.   Referral to [facility]rapist to evaluate balance and rule out BPPV and balance since Patricia Wall had "many falls" some of which included "head injury".  3.   Follow-up with [facility] to see if Patricia Wall qualified for an [employer] hearing aid (Call [phone]). 4.   To minimize the adverse effects of tinnitus 1) avoid quiet  2) use noise maskers at home such as a sound machine, quiet music, a fan or other background noise at a volume just loud enough to mask the high pitched tinnitus. 3) If the tinnitus becomes more bothersome, adversely affecting your sleep or concentration, contact your physician. 5.  Strategies that help improve hearing include: A) Face the speaker directly. Optimal is having the speakers face well - lit.  Unless amplified, being within 3-6 feet of the speaker will enhance word  recognition. B) Avoid having the speaker back-lit as this will minimize the ability to use cues from lip-reading, facial expression and gestures. C)  Word recognition is poorer  in background noise. For optimal word recognition, turn off the TV, radio or noisy fan when engaging in conversation. In a restaurant, try to sit away from noise sources and close to the primary speaker.        D)  Ask for topic clarification from time to time in order to remain in the conversation.  Most people don't mind repeating or clarifying a point when asked.  If needed, explain the difficulty hearing in background noise or hearing loss. 6.  Monitor hearing closely with a hearing evaluation in 6 months - earlier if there are changes in hearing.   Maat Kafer L. Kate Sable, Au.D., CCC-A Doctor of Audiology    cc: [name], MD

## 2016-10-26 ENCOUNTER — Ambulatory Visit (INDEPENDENT_AMBULATORY_CARE_PROVIDER_SITE_OTHER): Payer: Medicare Other | Admitting: Adult Health

## 2016-10-26 ENCOUNTER — Encounter: Payer: Self-pay | Admitting: Adult Health

## 2016-10-26 DIAGNOSIS — J441 Chronic obstructive pulmonary disease with (acute) exacerbation: Secondary | ICD-10-CM | POA: Diagnosis not present

## 2016-10-26 DIAGNOSIS — J449 Chronic obstructive pulmonary disease, unspecified: Secondary | ICD-10-CM | POA: Diagnosis not present

## 2016-10-26 DIAGNOSIS — J9611 Chronic respiratory failure with hypoxia: Secondary | ICD-10-CM

## 2016-10-26 MED ORDER — ACETAMINOPHEN-CODEINE #3 300-30 MG PO TABS: ORAL_TABLET | ORAL | 0 refills | Status: DC

## 2016-10-26 MED ORDER — TIOTROPIUM BROMIDE MONOHYDRATE 2.5 MCG/ACT IN AERS: 2.0000 | INHALATION_SPRAY | Freq: Every day | RESPIRATORY_TRACT | 5 refills | Status: AC

## 2016-10-26 MED ORDER — BUDESONIDE-FORMOTEROL FUMARATE 160-4.5 MCG/ACT IN AERO: 2.0000 | INHALATION_SPRAY | Freq: Two times a day (BID) | RESPIRATORY_TRACT | 5 refills | Status: DC

## 2016-10-26 NOTE — Patient Instructions (Signed)
Continue on current regimen .  Work on quitting smoking .  Follow up Dr. Sherene SiresWert  In 4 months and As needed

## 2016-10-26 NOTE — Assessment & Plan Note (Signed)
Stable without flare   Plan  Patient Instructions  Continue on current regimen .  Work on quitting smoking .  Follow up Dr. Sherene SiresWert  In 4 months and As needed

## 2016-10-26 NOTE — Progress Notes (Signed)
@Patient  ID: Patricia Wall, female    DOB: 04/27/1950, 66 y.o.   MRN: [id number]  Chief Complaint  Patient presents with  . 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    10/26/2016 Follow up : COPD /Smoker , O2 RF  Patient presents for a three-month follow-up. Overall says breathing is doing about the same. Gets winded with walking. No recent abx or steroids.  Remains on Symbicort and Spiriva . Denies flare of cough or wheezing .  Still smoking , Has cut back some, down to 1/2 PPD.  Remains on O2 2l/m . Doing well.  PVX and Prevnar vaccine utd.     No Known Allergies  Immunization History  Administered Date(s) Administered  . H1N1 04/15/2008  . Influenza Split 12/19/2008, 02/24/2012, 01/02/2013, 12/23/2013  . Influenza Whole 02/14/2006, 12/27/2012, 11/20/2015  . Influenza,inj,Quad PF,36+ Mos 12/09/2014  . Influenza-Unspecified 12/25/2013, 12/29/2014, 11/28/2015  . Pneumococcal Conjugate-13 12/27/2008, 10/15/2013, 04/03/2014  . Pneumococcal Polysaccharide-23 04/22/2003, 05/19/2005, 05/23/2005, 12/25/2008, 01/20/2015  . Td 04/15/2008, 12/19/2008  . Tdap 09/16/2007  . Zoster 10/02/2014    Past Medical History:  Diagnosis Date  . Abnormal chest xray   . Alcohol abuse   . Back pain   . CAD (coronary artery disease)     Prev seen by [facility].  Stent to OM and LAD Last cath 6/11 cathed on June 13, [name]. 2011.  The cardiac catheterization showed 30% and 50% lesions in the mid  and distal LAD, 50% in the OM1, 40% in the OM2 with 20% in-stent  restenosis, 40% in-stent restenosis in the circumflex and no significant  disease in the RCA.  Her EF was normal.  Dr. Olevia Perches recommended a trial  of proton pump inhibitors   . Chest pain   .  COPD (chronic obstructive pulmonary disease) ([place])   . Cyanosis   . DDD (degenerative disc disease)   . Depression   . Diarrhea   . Diverticular disease   . GERD (gastroesophageal reflux disease)   . H/O: hysterectomy   . Hemorrhoids   . HTN (hypertension)   . IBS (irritable bowel syndrome)   . Insomnia   . Lymphadenitis   . Pharyngitis   . PUD (peptic ulcer disease)   . PVD (peripheral vascular disease) ([place])   . Sinusitis   . TIA (transient ischemic attack)   . Tobacco abuse   . Vertigo     Tobacco History: History  Smoking Status  . Current Every Day Smoker  . Packs/day: 2.00  . Years: 52.00  . Types: Cigarettes  Smokeless Tobacco  . Never Used    Comment: down to 0.5ppd    Ready to quit: No Counseling given: Yes   Outpatient Encounter Prescriptions as of 10/26/2016  Medication Sig  . acetaminophen-codeine (TYLENOL #3) 300-30 MG tablet TAKE 1 TABLET EVERY 4 HOURS AS NEEDED FOR COUGH  . albuterol (PROAIR HFA) 108 (90 BASE) MCG/ACT inhaler Inhale 2 puffs into the lungs every 4 (four) hours as needed for wheezing or shortness of breath.  [name] albuterol (PROVENTIL) (2.5 MG/3ML) 0.083% nebulizer solution Take 3 mLs (2.5 mg total) by nebulization every 4 (four) hours as  needed for wheezing or shortness of breath.  [name] aspirin 81 MG tablet Take 81 mg by mouth at bedtime.   [name] atorvastatin (LIPITOR) 40 MG tablet Take 40 mg by mouth at bedtime.  . blood glucose meter kit and supplies Dispense based on patient and insurance preference. Use up to four times daily as directed. (FOR ICD-9 250.00, 250.01).  . budesonide-formoterol (SYMBICORT) 160-4.5 MCG/ACT inhaler Inhale 2 puffs into the lungs 2 (two) times daily.  . calcium carbonate (OS-CAL) 600 MG TABS Take 600 mg by mouth every morning.   . Cholecalciferol (VITAMIN D) 2000 UNITS tablet Take 2,000 Units by mouth every morning.   . clopidogrel (PLAVIX) 75 MG tablet Take 75 mg by mouth every morning.   [name] dextromethorphan-guaiFENesin  (MUCINEX DM) 30-600 MG per 12 hr tablet Take 1 tablet by mouth 2 (two) times daily.   . Diclofenac Potassium (CAMBIA) 50 MG PACK Take by mouth. Take at onset of headache as needed  . DULoxetine (CYMBALTA) 60 MG capsule Take 60 mg by mouth every morning.   . gabapentin (NEURONTIN) 300 MG capsule Take 300 mg by mouth 3 (three) times daily.   [name] glucose monitoring kit (FREESTYLE) monitoring kit 1 each by Does not apply route as needed for other.  . Insulin Syringe-Needle U-100 (INSULIN SYRINGE .5CC/30GX1/2") 30G X 1/2" 0.5 ML MISC 100  . Lancets (FREESTYLE) lancets Use as instructed  . letrozole (FEMARA) 2.5 MG tablet Take 2.5 mg by mouth every morning.   . Multiple Vitamin (MULTIVITAMIN) tablet Take 1 tablet by mouth every morning.   . nitroGLYCERIN (NITROSTAT) 0.4 MG SL tablet Place 0.4 mg under the tongue every 5 (five) minutes as needed for chest pain (may repeat x3).   [name] omeprazole (PRILOSEC) 40 MG capsule Take 40 mg by mouth daily before breakfast.   . ondansetron (ZOFRAN) 8 MG tablet Take 8 mg by mouth every 4 (four) hours as needed for nausea.   . orphenadrine (NORFLEX) 100 MG tablet Take 100 mg by mouth 2 (two) times daily as needed for muscle spasms.  . OXYGEN Use 2L 24/7  . Respiratory Therapy Supplies (FLUTTER) DEVI Use as directed  . [DISCONTINUED] acetaminophen-codeine (TYLENOL #3) 300-30 MG tablet TAKE 1 TABLET EVERY 4 HOURS AS NEEDED FOR COUGH  . [DISCONTINUED] budesonide-formoterol (SYMBICORT) 160-4.5 MCG/ACT inhaler Inhale 2 puffs into the lungs 2 (two) times daily.  . Tiotropium Bromide Monohydrate (SPIRIVA RESPIMAT) 2.5 MCG/ACT AERS Inhale 2 puffs into the lungs daily.  . [DISCONTINUED] alum & mag hydroxide-simeth (MAALOX/MYLANTA) 200-200-20 MG/5ML suspension Take per bottle as needed for upset stomach  . [DISCONTINUED] azithromycin (ZITHROMAX) 250 MG tablet Take 2 on day one then 1 daily x 4 days (Patient not taking: Reported on 10/26/2016)  . [DISCONTINUED] predniSONE (DELTASONE)  10 MG tablet Take  4 each am x 2 days,   2 each am x 2 days,  1 each am x 2 days and stop (Patient not taking: Reported on 10/26/2016)  . [DISCONTINUED] SPIRIVA RESPIMAT 2.5 MCG/ACT AERS TAKE 2 PUFFS EVERY MORNING (Patient not taking: Reported on 10/26/2016)  . [DISCONTINUED] tiotropium (SPIRIVA) 18 MCG inhalation capsule Place 18 mcg into inhaler and inhale daily.   No facility-administered encounter medications on file as of 10/26/2016.      Review of Systems  Constitutional:   No  weight loss, night sweats,  Fevers, chills +, fatigue, or  lassitude.  HEENT:   No headaches,  Difficulty swallowing,  Tooth/dental problems, or  Sore throat,  No sneezing, itching, ear ache, nasal congestion, post nasal drip,   CV:  No chest pain,  Orthopnea, PND, swelling in lower extremities, anasarca, dizziness, palpitations, syncope.   GI  No heartburn, indigestion, abdominal pain, nausea, vomiting, diarrhea, change in bowel habits, loss of appetite, bloody stools.   Resp:  No excess mucus, no productive cough,  No non-productive cough,  No coughing up of blood.  No change in color of mucus.  No wheezing.  No chest wall deformity  Skin: no rash or lesions.  GU: no dysuria, change in color of urine, no urgency or frequency.  No flank pain, no hematuria   MS:  No joint pain or swelling.  No decreased range of motion.  No back pain.    Physical Exam  BP (!) 124/56 (BP Location: Right Arm, Cuff Size: Normal)   Pulse (!) 102   Ht 5' 5"  (1.651 m)   Wt 185 lb 9.6 oz (84.2 kg)   SpO2 96%   BMI 30.89 kg/m   GEN: A/Ox3; pleasant , elderly, on O2    HEENT:  [place]/AT,  EACs-clear, TMs-wnl, NOSE-clear, THROAT-clear, no lesions, no postnasal drip or exudate noted.   NECK:  Supple w/ fair ROM; no JVD; normal carotid impulses w/o bruits; no thyromegaly or nodules palpated; no lymphadenopathy.    RESP  Decreased BS in bases  no accessory muscle use, no dullness to percussion  CARD:  RRR, no  m/r/g, no peripheral edema, pulses intact, no cyanosis or clubbing.  GI:   Soft & nt; nml bowel sounds; no organomegaly or masses detected.   Musco: Warm bil, no deformities or joint swelling noted.   Neuro: alert, no focal deficits noted.    Skin: Warm, no lesions or rashes    Lab Results:  CBC   Imaging: No results found.   Assessment & Plan:   COPD GOLD II / still smoking  Stable without flare   Plan  Patient Instructions  Continue on current regimen .  Work on quitting smoking .  Follow up Dr. Melvyn Novas  In 4 months and As needed        Chronic respiratory failure with hypoxia ([place]) Continue on O2 .      Rexene Edison, NP 10/26/2016

## 2016-10-26 NOTE — Progress Notes (Signed)
Chart and office note reviewed in detail  > agree with a/p as outlined    

## 2016-10-26 NOTE — Assessment & Plan Note (Signed)
Continue on O2 

## 2016-12-05 DIAGNOSIS — J441 Chronic obstructive pulmonary disease with (acute) exacerbation: Secondary | ICD-10-CM | POA: Diagnosis not present

## 2016-12-05 DIAGNOSIS — E785 Hyperlipidemia, unspecified: Secondary | ICD-10-CM | POA: Diagnosis not present

## 2016-12-05 DIAGNOSIS — J9621 Acute and chronic respiratory failure with hypoxia: Secondary | ICD-10-CM

## 2016-12-05 DIAGNOSIS — F17200 Nicotine dependence, unspecified, uncomplicated: Secondary | ICD-10-CM | POA: Diagnosis not present

## 2016-12-05 DIAGNOSIS — I2510 Atherosclerotic heart disease of native coronary artery without angina pectoris: Secondary | ICD-10-CM | POA: Diagnosis not present

## 2016-12-06 DIAGNOSIS — J441 Chronic obstructive pulmonary disease with (acute) exacerbation: Secondary | ICD-10-CM | POA: Diagnosis not present

## 2016-12-06 DIAGNOSIS — F17200 Nicotine dependence, unspecified, uncomplicated: Secondary | ICD-10-CM | POA: Diagnosis not present

## 2016-12-06 DIAGNOSIS — I2510 Atherosclerotic heart disease of native coronary artery without angina pectoris: Secondary | ICD-10-CM | POA: Diagnosis not present

## 2016-12-06 DIAGNOSIS — R0602 Shortness of breath: Secondary | ICD-10-CM

## 2016-12-06 DIAGNOSIS — J9621 Acute and chronic respiratory failure with hypoxia: Secondary | ICD-10-CM | POA: Diagnosis not present

## 2016-12-06 DIAGNOSIS — E785 Hyperlipidemia, unspecified: Secondary | ICD-10-CM | POA: Diagnosis not present

## 2016-12-07 DIAGNOSIS — J441 Chronic obstructive pulmonary disease with (acute) exacerbation: Secondary | ICD-10-CM | POA: Diagnosis not present

## 2016-12-07 DIAGNOSIS — I2510 Atherosclerotic heart disease of native coronary artery without angina pectoris: Secondary | ICD-10-CM | POA: Diagnosis not present

## 2016-12-07 DIAGNOSIS — F17200 Nicotine dependence, unspecified, uncomplicated: Secondary | ICD-10-CM | POA: Diagnosis not present

## 2016-12-07 DIAGNOSIS — E785 Hyperlipidemia, unspecified: Secondary | ICD-10-CM | POA: Diagnosis not present

## 2016-12-07 DIAGNOSIS — J9621 Acute and chronic respiratory failure with hypoxia: Secondary | ICD-10-CM | POA: Diagnosis not present

## 2016-12-09 NOTE — Assessment & Plan Note (Signed)
12/30/2013  See ov  - see ov 01/11/2016  rx zpak/ pred x 6 d -med calendar 01/27/2016      Mild exacerbation in the setting of ongoing smoking can be treated short-term with Z-Pak and prednisone but I'm concerned over the long hall that she is not addressing  the main issue which will change the natural history of her disease more than any medication or physician, namely smoking cessation (see separate a/p)

## 2017-02-27 ENCOUNTER — Ambulatory Visit: Payer: Medicare Other | Admitting: Internal Medicine

## 2017-03-02 ENCOUNTER — Encounter: Payer: Self-pay | Admitting: Internal Medicine

## 2017-03-02 ENCOUNTER — Ambulatory Visit (INDEPENDENT_AMBULATORY_CARE_PROVIDER_SITE_OTHER): Payer: Medicare Other | Admitting: Internal Medicine

## 2017-03-02 VITALS — BP 90/60 | HR 100 | Ht 65.0 in | Wt 186.0 lb

## 2017-03-02 DIAGNOSIS — J9611 Chronic respiratory failure with hypoxia: Secondary | ICD-10-CM

## 2017-03-02 DIAGNOSIS — F17210 Nicotine dependence, cigarettes, uncomplicated: Secondary | ICD-10-CM

## 2017-03-02 DIAGNOSIS — J441 Chronic obstructive pulmonary disease with (acute) exacerbation: Secondary | ICD-10-CM | POA: Diagnosis not present

## 2017-03-02 MED ORDER — AZITHROMYCIN 250 MG PO TABS: ORAL_TABLET | ORAL | 0 refills | Status: DC

## 2017-03-02 MED ORDER — PREDNISONE 10 MG PO TABS: ORAL_TABLET | ORAL | 0 refills | Status: DC

## 2017-03-02 MED ORDER — ACETAMINOPHEN-CODEINE #3 300-30 MG PO TABS: ORAL_TABLET | ORAL | 0 refills | Status: DC

## 2017-03-02 NOTE — Progress Notes (Signed)
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 rec Prilosec Take 30-60 min before first meal of the day  Stop all inhalers except ventolin and use it up to 2 puffs every 4 hours if can't catch your breath Anoro open once and take two dep drags each am as soon as you get up each day    07/25/2014  Acute  ov/Wert re: aecopd/ GOLD II  On coreg/ using top part of med calendar but not the prns at bottom  Chief Complaint  Patient presents with  . Acute Visit    prod cough w/green mucus; passing out when coughing; chest congestion; chest tightness; SOB at all times  was doing fine on breo then gradually  worse since changed to symbicort with poor hfa  Cough /breathing worse in am's, has sense of sob even at rest/ harsh upper airway quality coughing  rec zpak Prednisone 10 mg take  4 each am x 2 days,   2 each am x 2 days,  1 each am x 2 days and stop  Reduce the carevdilol to one-half twice daily  For cough mucinex dm up to 1200 mg every 12 hours and the flutter valve  See Tammy NP w/in 4 weeks> DID NOT DO     04/28/2016  f/u ov/Wert re:  GOLD II/ still smokng / 02 2lpm 24/7 - using med calendar well  Chief Complaint  Patient presents with  . Follow-up    Pt states her breathing is progressively worsening. She states her cough has been worse for the past 3 wks- prod with clear sputum. She  also c/o chest tightness. She is still smoking- 5 cigs per day. She is using albuterol inhaler 2-3 x per wk and neb with albuterol about every other day on average.   does fine walking for a couple of aisles at [facility]  Mucus is clear, not keeping her up or early am exac  Better with saba  rec Prednisone 10 mg take  4 each am x 2 days,   2 each am x 2 days,  1 each am x 2 days and stop  The key is to stop smoking completely before smoking completely stops you!  See calendar for specific medication instructions    07/26/2016  f/u ov/Wert re: copd II / still smoking on 2lpm 24/7 no med calendar / not able to confirm med reconciliation  Chief Complaint  Patient presents with  . Follow-up    Pt c/o increased SOB and cough x 2 wks. Her cough is prod with large amounts of light brown sputum.  She is using her albuterol inhaler 4-5 x per wk on average and rarely uses neb.   no change doe = MMRC3 = can't walk 100  yards even at a slow pace at a flat grade s stopping due to sob  On 2lpm 24/7  Brown mucus mostly in am x 2 weeks prior to OV   rec Prednisone 10 mg take  4 each am x 2 days,   2 each am x 2 days,  1 each am x 2 days and stop  zpak For cough/ congestion > mucinex dm 1200 mg every 12 hours and use the flutter valve as much as you can     03/02/2017  f/u ov/Wert re:  COPD II/ still smoking / no med calendar  Chief Complaint  Patient presents with  . Follow-up    She states she is getting over a cold. Pt has some rhinitis. She has been using albuterol inhaler and neb 2 x daily on average.   sleeping ok on 2 pillows and 2lpm  Very congested cough/ doesn't think her flutter is effective but not sure it's working right and didn't bring it to check out with most of the mucus thick beige esp in am's but not disctubing sleep No change doe =  MMRC3 even on 2lpm with sats typically in the low 90's when stops due to sob   No obvious day to day or daytime variability or assoc excess/ purulent sputum  or mucus plugs or hemoptysis or cp or chest tightness, subjective wheeze or overt   hb symptoms. No unusual exposure hx or h/o childhood pna/ asthma or knowledge of premature birth.  Sleeping ok 2lpm  2 pillows  without nocturnal  or early am exacerbation  of respiratory  c/o's or need for noct saba. Also denies any obvious fluctuation of symptoms with weather or environmental changes or other aggravating or alleviating factors except as outlined above   Current Allergies, Complete Past Medical History, Past Surgical History, Family History, and Social History were reviewed in [employer] record.  ROS  The following are not active complaints unless bolded Hoarseness, sore throat, dysphagia, dental problems, itching, sneezing,  nasal congestion or discharge of excess mucus or purulent secretions, ear ache,   fever, chills, sweats, unintended wt loss or wt gain, classically pleuritic or exertional cp,  orthopnea pnd or leg swelling, presyncope, palpitations, abdominal pain, anorexia, nausea, vomiting, diarrhea  or change in bowel habits or change in bladder habits, change in stools or change in urine, dysuria, hematuria,  rash, arthralgias, visual complaints, headache, numbness, weakness or ataxia or problems with walking or coordination,  change in mood/affect or memory.        Current Meds - not able to confirm this is correct  Medication Sig  . albuterol (PROAIR HFA) 108 (90 BASE) MCG/ACT inhaler Inhale 2 puffs into the lungs every 4 (four) hours as needed for wheezing or shortness of breath.  [name] albuterol (PROVENTIL) (2.5 MG/3ML) 0.083% nebulizer solution Take 3 mLs (2.5 mg total) by nebulization every 4 (four) hours as needed for wheezing or shortness of breath.  [name] aspirin 81 MG tablet Take 81 mg by mouth at bedtime.   [name] atorvastatin (LIPITOR) 40 MG tablet Take 40 mg by mouth at bedtime.  . blood glucose meter kit and supplies Dispense based on patient and insurance  preference. Use up to four times daily as directed. (FOR ICD-9 250.00, 250.01).  . budesonide-formoterol (SYMBICORT) 160-4.5 MCG/ACT inhaler Inhale 2 puffs into the lungs 2 (two) times daily.  . calcium carbonate (OS-CAL) 600 MG TABS Take 600 mg by mouth every morning.   . Cholecalciferol (  VITAMIN D) 2000 UNITS tablet Take 2,000 Units by mouth every morning.   . clopidogrel (PLAVIX) 75 MG tablet Take 75 mg by mouth every morning.   [name] dextromethorphan-guaiFENesin (MUCINEX DM) 30-600 MG per 12 hr tablet Take 1 tablet by mouth 2 (two) times daily.   . Diclofenac Potassium (CAMBIA) 50 MG PACK Take by mouth. Take at onset of headache as needed  . DULoxetine (CYMBALTA) 60 MG capsule Take 60 mg by mouth every morning.   . gabapentin (NEURONTIN) 300 MG capsule Take 300 mg by mouth 3 (three) times daily.   [name] glucose monitoring kit (FREESTYLE) monitoring kit 1 each by Does not apply route as needed for other.  . Insulin Syringe-Needle U-100 (INSULIN SYRINGE .5CC/30GX1/2") 30G X 1/2" 0.5 ML MISC 100  . Lancets (FREESTYLE) lancets Use as instructed  . letrozole (FEMARA) 2.5 MG tablet Take 2.5 mg by mouth every morning.   . Multiple Vitamin (MULTIVITAMIN) tablet Take 1 tablet by mouth every morning.   . nitroGLYCERIN (NITROSTAT) 0.4 MG SL tablet Place 0.4 mg under the tongue every 5 (five) minutes as needed for chest pain (may repeat x3).   [name] omeprazole (PRILOSEC) 40 MG capsule Take 40 mg by mouth daily before breakfast.   . ondansetron (ZOFRAN) 8 MG tablet Take 8 mg by mouth every 4 (four) hours as needed for nausea.   . orphenadrine (NORFLEX) 100 MG tablet Take 100 mg by mouth 2 (two) times daily as needed for muscle spasms.  . OXYGEN Use 2L 24/7  . Respiratory Therapy Supplies (FLUTTER) DEVI Use as directed  . Tiotropium Bromide Monohydrate (SPIRIVA RESPIMAT) 2.5 MCG/ACT AERS Inhale 2 puffs into the lungs daily.                    Objective:  Physical Exam   Obese wf able to get up from chair  and climb on exam table with one person assist     04/02/2013  198  >   10/02/2013   207 >  201 10/15/13 > 12/30/2013  207 >209 01/27/2014 > 05/02/2014 215>211 05/30/14 > 07/25/2014   214 > 10/31/2014  221 >213 12/09/14 >205 01/30/2015 > 02/10/2015    202 > 04/02/2015  206 >   10/08/2015   182 > 01/11/2016 177 >  04/28/2016  181 >  07/26/2016   184   > 03/02/2017  186    Vital signs reviewed - Note on arrival 02 sats  95% on 2lpm     HEENT: nl turbinates bilaterally, and oropharynx. Nl external ear canals without cough reflex - edentulous  NECK :  without JVD/Nodes/TM/ nl carotid upstrokes bilaterally   LUNGS: no acc muscle use, slt barrel chest, pan exp rhonchi sym bilaterally  With cough on exp    CV:  RRR  no s3 or murmur or increase in P2, and no edema   ABD:  Obese soft and nontender with limited  inspiratory excursion in the supine position. No bruits or organomegaly appreciated, bowel sounds nl  MS:  Nl gait/ ext warm without deformities, calf tenderness, cyanosis or clubbing No obvious joint restrictions   SKIN: warm and dry without lesions    NEURO:  alert, approp, nl sensorium with  no motor or cerebellar deficits apparent.          Assessment:

## 2017-03-02 NOTE — Patient Instructions (Signed)
Prednisone 10 mg take  4 each am x 2 days,   2 each am x 2 days,  1 each am x 2 days and stop   zpak   I refilled your tylenol #3   See Tammy NP in 6  weeks with all your medications, even over the counter meds, separated in two separate bags, the ones you take no matter what vs the ones you stop once you feel better and take only as needed when you feel you need them.   Tammy  will generate for you a new user friendly medication calendar that will put us all on the same page re: your medication use.

## 2017-03-03 ENCOUNTER — Encounter: Payer: Self-pay | Admitting: Internal Medicine

## 2017-03-03 NOTE — Assessment & Plan Note (Signed)

## 2017-03-03 NOTE — Assessment & Plan Note (Addendum)
12/30/2013  See ov  - see ov 01/11/2016  rx zpak/ pred x 6 d -med calendar 01/27/2016  - see ov 07/26/2016    zpak/ pred x 6 d  - see ov 03/02/2017 rx zpak/pred x 6 days/ reinstructed on use of flutter   She continues to be  Group D in terms of symptom/risk and laba/lama/ICS  therefore appropriate rx at this point with persistent though mild flare of copd so rx as above   - The proper method of use of a flutter  are discussed and demonstrated to the patient.    My main concerns are she's not following med calendar and showing no interest in stopping smoking.  rec return in 6 weeks for new updated med calendar/ work on at least cutting down on smoking in meantime    I had an extended discussion with the patient and daughter reviewing all relevant studies completed to date and  lasting 15 to 20 minutes of a 25 minute visit    Each maintenance medication was reviewed in detail including most importantly the difference between maintenance and prns and under what circumstances the prns are to be triggered using an action plan format that is not reflected in the computer generated alphabetically organized AVS but trather by a customized med calendar that reflects the AVS meds with confirmed 100% correlation.   In addition, Please see AVS for unique instructions that I personally wrote and verbalized to the the pt in detail and then reviewed with pt  by my nurse highlighting any  changes in therapy recommended at today's visit to their plan of care.

## 2017-03-03 NOTE — Assessment & Plan Note (Signed)
-   See ono 09/29/13 > desat x 54 min < 89% > rec 2lpm  - 07/26/2016 Patient Saturations on Room Air at Rest = 87%---increased to 92% on 2lpm pulsed o2   As of 03/02/2017 rec 2lpm 24/7

## 2017-03-09 ENCOUNTER — Telehealth: Payer: Self-pay | Admitting: Internal Medicine

## 2017-03-09 MED ORDER — PREDNISONE 10 MG PO TABS: ORAL_TABLET | ORAL | 0 refills | Status: DC

## 2017-03-09 MED ORDER — AZITHROMYCIN 250 MG PO TABS: ORAL_TABLET | ORAL | 0 refills | Status: DC

## 2017-03-09 NOTE — Telephone Encounter (Signed)
Patient called back and advised it was the Antibiotic and prednisone were not sent over.  Patient's CB is [phone].

## 2017-03-09 NOTE — Telephone Encounter (Signed)
Spoke with pt, advised Rx for Prednisone and Azithromycin was sent to the wrong pharmacy so I sent them to the correct pharmacy in [place]. Nothing further is needed.

## 2017-03-09 NOTE — Telephone Encounter (Signed)
ATC pt, no answer. Left message for pt to call back.  Which medications?

## 2017-04-14 ENCOUNTER — Encounter: Payer: Self-pay | Admitting: Adult Health

## 2017-04-14 ENCOUNTER — Ambulatory Visit (INDEPENDENT_AMBULATORY_CARE_PROVIDER_SITE_OTHER): Payer: Medicare Other | Admitting: Adult Health

## 2017-04-14 DIAGNOSIS — J9611 Chronic respiratory failure with hypoxia: Secondary | ICD-10-CM | POA: Diagnosis not present

## 2017-04-14 DIAGNOSIS — J449 Chronic obstructive pulmonary disease, unspecified: Secondary | ICD-10-CM | POA: Diagnosis not present

## 2017-04-14 MED ORDER — BENZONATATE 200 MG PO CAPS: 200.0000 mg | ORAL_CAPSULE | Freq: Three times a day (TID) | ORAL | 1 refills | Status: DC | PRN

## 2017-04-14 NOTE — Assessment & Plan Note (Signed)
Exacerbation -tx w/ abx and steroids  Patient's medications were reviewed today and patient education was given. Computerized medication calendar was adjusted/completed    Plan  Patient Instructions  Take zpack and prednisone as directed.  Follow med calendar closely and bring to each visit.  May use Tessalon Three times a day  As needed  Cough.  Work on quitting smoking .  Follow up Dr. Sherene SiresWert  In 3 months and As needed   Please contact office for sooner follow up if symptoms do not improve or worsen or seek emergency care

## 2017-04-14 NOTE — Progress Notes (Signed)
@Patient  ID: Patricia Wall, female    DOB: [birth year], 67 y.o.   MRN: [id number]  Chief Complaint  Patient presents with  . 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     04/14/2017 Follow up : COPD , Smoker, O2 RF  Patient presents for a one-month follow-up.  Patient says over the last week her breathing has worsened with increased cough and congestion.  She is coughing up thick mucus.  She does have a Z-Pak and prednisone which she did not need to take last month.  She denies any hemoptysis chest pain orthopnea PND or leg swelling. She remains on Symbicort and Spiriva.  Patient is still smoking.  Smoking cessation discussed.  We reviewed all her medications organize them into a medication count with patient education. Appears to be taking her medications correctly.    No Known Allergies  Immunization History  Administered Date(s) Administered  . H1N1 04/15/2008  . Influenza Split 12/19/2008, 02/24/2012, 01/02/2013, 12/23/2013  . Influenza Whole 02/14/2006, 12/27/2012, 11/20/2015  . Influenza, High Dose Seasonal PF 12/23/2016  . Influenza, Seasonal, Injecte, Preservative Fre 12/25/2013, 12/29/2014, 11/28/2015  . Influenza,inj,Quad PF,6+ Mos 12/09/2014  . Influenza-Unspecified 12/25/2013, 12/29/2014, 11/28/2015  . Pneumococcal Conjugate-13 12/27/2008, 10/15/2013, 04/03/2014  . Pneumococcal Polysaccharide-23 04/22/2003, 05/19/2005, 05/23/2005, 12/25/2008, 01/20/2015  . Td 04/15/2008, 12/19/2008  . Tdap 09/16/2007  . Zoster 10/02/2014    Past Medical History:  Diagnosis Date  . Abnormal chest xray   . Alcohol abuse   . Back pain   . CAD (coronary artery disease)     Prev seen by [facility].  Stent to OM and LAD Last cath  6/11 cathed on June 13, [name]. 2011.  The cardiac catheterization showed 30% and 50% lesions in the mid  and distal LAD, 50% in the OM1, 40% in the OM2 with 20% in-stent  restenosis, 40% in-stent restenosis in the circumflex and no significant  disease in the RCA.  Her EF was normal.  Dr. Olevia Perches recommended a trial  of proton pump inhibitors   . Chest pain   . COPD (chronic obstructive pulmonary disease) ([place])   . Cyanosis   . DDD (degenerative disc disease)   . Depression   . Diarrhea   . Diverticular disease   . GERD (gastroesophageal reflux disease)   . H/O: hysterectomy   . Hemorrhoids   . HTN (hypertension)   . IBS (irritable bowel syndrome)   . Insomnia   . Lymphadenitis   . Pharyngitis   . PUD (peptic ulcer disease)   . PVD (peripheral vascular disease) ([place])   . Sinusitis   . TIA (transient ischemic attack)   . Tobacco abuse   . Vertigo     Tobacco History: Social History   Tobacco Use  Smoking Status Current Every Day Smoker  . Packs/day: 2.00  . Years: 52.00  . Pack years: 104.00  . Types: Cigarettes  Smokeless Tobacco Never Used  Tobacco Comment   down to 0.5ppd    Ready to quit: No Counseling given: Yes Comment: down to 0.5ppd    Outpatient Encounter Medications as of 04/14/2017  Medication Sig  . butalbital-acetaminophen-caffeine (FIORICET, ESGIC) 50-325-40 MG tablet  1 - 1 and 1/2 every 8 hours as needed for headache  . Dextromethorphan-Guaifenesin (MUCINEX FAST-MAX DM MAX) 5-100 MG/5ML LIQD 1 tsp every 4 hours as needed for cough/congestion  . metFORMIN (GLUCOPHAGE) 1000 MG tablet Take 1,000 mg by mouth 2 (two) times daily with a meal.  . methocarbamol (ROBAXIN) 750 MG tablet Take 750 mg by mouth every 8 (eight) hours as needed for muscle spasms.  [name] acetaminophen-codeine (TYLENOL #3) 300-30 MG tablet TAKE 1 TABLET EVERY 4 HOURS AS NEEDED FOR COUGH  . albuterol (PROVENTIL) (2.5 MG/3ML) 0.083% nebulizer solution Take 3 mLs (2.5 mg total) by nebulization  every 4 (four) hours as needed for wheezing or shortness of breath.  [name] aspirin 81 MG tablet Take 81 mg by mouth at bedtime.   [name] atorvastatin (LIPITOR) 40 MG tablet Take 40 mg by mouth at bedtime.  [name] azithromycin (ZITHROMAX) 250 MG tablet Take 2 on day one then 1 daily x 4 days  . benzonatate (TESSALON) 200 MG capsule Take 1 capsule (200 mg total) by mouth 3 (three) times daily as needed for cough.  . blood glucose meter kit and supplies Dispense based on patient and insurance preference. Use up to four times daily as directed. (FOR ICD-9 250.00, 250.01).  . budesonide-formoterol (SYMBICORT) 160-4.5 MCG/ACT inhaler Inhale 2 puffs into the lungs 2 (two) times daily.  . calcium carbonate (OS-CAL) 600 MG TABS Take 600 mg by mouth every morning.   . Cholecalciferol (VITAMIN D) 2000 UNITS tablet Take 2,000 Units by mouth every morning.   . clopidogrel (PLAVIX) 75 MG tablet Take 75 mg by mouth every morning.   . Diclofenac Potassium (CAMBIA) 50 MG PACK Take at onset of headache as needed   . DULoxetine (CYMBALTA) 60 MG capsule Take 60 mg by mouth every morning.   . gabapentin (NEURONTIN) 300 MG capsule Take 300 mg by mouth 3 (three) times daily.   [name] glucose monitoring kit (FREESTYLE) monitoring kit 1 each by Does not apply route as needed for other.  . Insulin Syringe-Needle U-100 (INSULIN SYRINGE .5CC/30GX1/2") 30G X 1/2" 0.5 ML MISC 100  . Lancets (FREESTYLE) lancets Use as instructed  . letrozole (FEMARA) 2.5 MG tablet Take 2.5 mg by mouth every morning.   . Multiple Vitamin (MULTIVITAMIN) tablet Take 1 tablet by mouth every morning.   . nitroGLYCERIN (NITROSTAT) 0.4 MG SL tablet Place 0.4 mg under the tongue every 5 (five) minutes as needed for chest pain (may repeat x3).   [name] omeprazole (PRILOSEC) 40 MG capsule Take 40 mg by mouth daily before breakfast.   . ondansetron (ZOFRAN) 8 MG tablet Take 8 mg by mouth every 4 (four) hours as needed for nausea.   . orphenadrine (NORFLEX) 100 MG tablet Take  100 mg by mouth 2 (two) times daily as needed for muscle spasms.  . OXYGEN Use 2L 24/7  . predniSONE (DELTASONE) 10 MG tablet Take  4 each am x 2 days,   2 each am x 2 days,  1 each am x 2 days and stop  . Respiratory Therapy Supplies (FLUTTER) DEVI Use as directed  . Tiotropium Bromide Monohydrate (SPIRIVA RESPIMAT) 2.5 MCG/ACT AERS Inhale 2 puffs into the lungs daily.  . [DISCONTINUED] albuterol (PROAIR HFA) 108 (90 BASE) MCG/ACT inhaler Inhale 2 puffs into the lungs every 4 (four) hours as needed for wheezing or shortness of breath.  . [DISCONTINUED] dextromethorphan-guaiFENesin (MUCINEX DM) 30-600 MG per 12 hr tablet Take 1 tablet by mouth 2 (two) times daily.  No facility-administered encounter medications on file as of 04/14/2017.      Review of Systems  Constitutional:   No  weight loss, night sweats,  Fevers, chills,  +fatigue, or  lassitude.  HEENT:   No headaches,  Difficulty swallowing,  Tooth/dental problems, or  Sore throat,                No sneezing, itching, ear ache,  +nasal congestion, post nasal drip,   CV:  No chest pain,  Orthopnea, PND, swelling in lower extremities, anasarca, dizziness, palpitations, syncope.   GI  No heartburn, indigestion, abdominal pain, nausea, vomiting, diarrhea, change in bowel habits, loss of appetite, bloody stools.   Resp:    No chest wall deformity  Skin: no rash or lesions.  GU: no dysuria, change in color of urine, no urgency or frequency.  No flank pain, no hematuria   MS:  No joint pain or swelling.  No decreased range of motion.  No back pain.    Physical Exam  BP 122/74 (BP Location: Right Arm, Cuff Size: Normal)   Ht 5' 6"  (1.676 m)   Wt 186 lb 6.4 oz (84.6 kg)   SpO2 90%   BMI 30.09 kg/m   GEN: A/Ox3; pleasant , NAD, elderly on oxygen with walker   HEENT:  [place]/AT,  EACs-clear, TMs-wnl, NOSE-clear, THROAT-clear, no lesions, no postnasal drip or exudate noted.   NECK:  Supple w/ fair ROM; no JVD; normal carotid  impulses w/o bruits; no thyromegaly or nodules palpated; no lymphadenopathy.    RESP few trace rhonchi  no accessory muscle use, no dullness to percussion  CARD:  RRR, no m/r/g, tr  peripheral edema, pulses intact, no cyanosis or clubbing.  GI:   Soft & nt; nml bowel sounds; no organomegaly or masses detected.   Musco: Warm bil, no deformities or joint swelling noted.   Neuro: alert, no focal deficits noted.    Skin: Warm, no lesions or rashes    Lab Results:   BMET  ProBNP   Imaging: No results found.   Assessment & Plan:   COPD GOLD II / still smoking  Exacerbation -tx w/ abx and steroids  Patient's medications were reviewed today and patient education was given. Computerized medication calendar was adjusted/completed    Plan  Patient Instructions  Take zpack and prednisone as directed.  Follow med calendar closely and bring to each visit.  May use Tessalon Three times a day  As needed  Cough.  Work on quitting smoking .  Follow up Dr. Melvyn Novas  In 3 months and As needed   Please contact office for sooner follow up if symptoms do not improve or worsen or seek emergency care         Chronic respiratory failure with hypoxia ([place]) Cont on O2      Bettymae Yott, NP 04/14/2017

## 2017-04-14 NOTE — Assessment & Plan Note (Signed)
Cont on O2 .  

## 2017-04-14 NOTE — Patient Instructions (Signed)
Take zpack and prednisone as directed.  Follow med calendar closely and bring to each visit.  May use Tessalon Three times a day  As needed  Cough.  Work on quitting smoking .  Follow up Dr. Sherene SiresWert  In 3 months and As needed   Please contact office for sooner follow up if symptoms do not improve or worsen or seek emergency care

## 2017-04-15 NOTE — Progress Notes (Signed)
Chart and office note reviewed in detail  > agree with a/p as outlined    

## 2017-04-28 DIAGNOSIS — J9621 Acute and chronic respiratory failure with hypoxia: Secondary | ICD-10-CM | POA: Diagnosis not present

## 2017-04-28 DIAGNOSIS — J441 Chronic obstructive pulmonary disease with (acute) exacerbation: Secondary | ICD-10-CM

## 2017-04-28 DIAGNOSIS — J449 Chronic obstructive pulmonary disease, unspecified: Secondary | ICD-10-CM | POA: Diagnosis not present

## 2017-04-29 DIAGNOSIS — J9621 Acute and chronic respiratory failure with hypoxia: Secondary | ICD-10-CM | POA: Diagnosis not present

## 2017-04-29 DIAGNOSIS — J441 Chronic obstructive pulmonary disease with (acute) exacerbation: Secondary | ICD-10-CM | POA: Diagnosis not present

## 2017-04-29 DIAGNOSIS — J449 Chronic obstructive pulmonary disease, unspecified: Secondary | ICD-10-CM | POA: Diagnosis not present

## 2017-04-30 DIAGNOSIS — J449 Chronic obstructive pulmonary disease, unspecified: Secondary | ICD-10-CM | POA: Diagnosis not present

## 2017-04-30 DIAGNOSIS — J441 Chronic obstructive pulmonary disease with (acute) exacerbation: Secondary | ICD-10-CM | POA: Diagnosis not present

## 2017-04-30 DIAGNOSIS — J9621 Acute and chronic respiratory failure with hypoxia: Secondary | ICD-10-CM | POA: Diagnosis not present

## 2017-05-01 DIAGNOSIS — J9621 Acute and chronic respiratory failure with hypoxia: Secondary | ICD-10-CM | POA: Diagnosis not present

## 2017-05-01 DIAGNOSIS — J441 Chronic obstructive pulmonary disease with (acute) exacerbation: Secondary | ICD-10-CM | POA: Diagnosis not present

## 2017-05-01 DIAGNOSIS — J449 Chronic obstructive pulmonary disease, unspecified: Secondary | ICD-10-CM | POA: Diagnosis not present

## 2017-05-02 DIAGNOSIS — J441 Chronic obstructive pulmonary disease with (acute) exacerbation: Secondary | ICD-10-CM | POA: Diagnosis not present

## 2017-05-02 DIAGNOSIS — J9621 Acute and chronic respiratory failure with hypoxia: Secondary | ICD-10-CM | POA: Diagnosis not present

## 2017-05-02 DIAGNOSIS — J449 Chronic obstructive pulmonary disease, unspecified: Secondary | ICD-10-CM | POA: Diagnosis not present

## 2017-05-03 DIAGNOSIS — J9621 Acute and chronic respiratory failure with hypoxia: Secondary | ICD-10-CM | POA: Diagnosis not present

## 2017-05-03 DIAGNOSIS — J441 Chronic obstructive pulmonary disease with (acute) exacerbation: Secondary | ICD-10-CM | POA: Diagnosis not present

## 2017-05-03 DIAGNOSIS — J449 Chronic obstructive pulmonary disease, unspecified: Secondary | ICD-10-CM | POA: Diagnosis not present

## 2017-06-23 ENCOUNTER — Telehealth: Payer: Self-pay | Admitting: Internal Medicine

## 2017-06-23 MED ORDER — ALBUTEROL SULFATE (2.5 MG/3ML) 0.083% IN NEBU: 2.5000 mg | INHALATION_SOLUTION | RESPIRATORY_TRACT | 0 refills | Status: DC | PRN

## 2017-06-23 NOTE — Telephone Encounter (Signed)
lmtcb x1 for pt. 

## 2017-06-23 NOTE — Telephone Encounter (Signed)
Spoke with pt. She is requesting a refill on albuterol neb solution. Rx has been sent in. Pt has a pending OV with MW later this month. Nothing further was needed.

## 2017-07-13 ENCOUNTER — Ambulatory Visit (INDEPENDENT_AMBULATORY_CARE_PROVIDER_SITE_OTHER)
Admission: RE | Admit: 2017-07-13 | Discharge: 2017-07-13 | Disposition: A | Discharge status: Home / Self Care | Payer: Medicare Other | Source: Ambulatory Visit | Attending: Internal Medicine | Admitting: Internal Medicine

## 2017-07-13 ENCOUNTER — Encounter: Payer: Self-pay | Admitting: Internal Medicine

## 2017-07-13 ENCOUNTER — Ambulatory Visit (INDEPENDENT_AMBULATORY_CARE_PROVIDER_SITE_OTHER): Payer: Medicare Other | Admitting: Internal Medicine

## 2017-07-13 ENCOUNTER — Other Ambulatory Visit: Payer: Self-pay | Admitting: Internal Medicine

## 2017-07-13 VITALS — BP 122/72 | HR 114 | Ht 66.0 in | Wt 185.4 lb

## 2017-07-13 DIAGNOSIS — J9611 Chronic respiratory failure with hypoxia: Secondary | ICD-10-CM | POA: Diagnosis not present

## 2017-07-13 DIAGNOSIS — F17210 Nicotine dependence, cigarettes, uncomplicated: Secondary | ICD-10-CM | POA: Diagnosis not present

## 2017-07-13 DIAGNOSIS — J441 Chronic obstructive pulmonary disease with (acute) exacerbation: Secondary | ICD-10-CM

## 2017-07-13 DIAGNOSIS — J449 Chronic obstructive pulmonary disease, unspecified: Secondary | ICD-10-CM

## 2017-07-13 MED ORDER — PREDNISONE 10 MG PO TABS: ORAL_TABLET | ORAL | 0 refills | Status: DC

## 2017-07-13 MED ORDER — AZITHROMYCIN 250 MG PO TABS
ORAL_TABLET | ORAL | 0 refills | Status: DC
Start: 2017-07-13 — End: 2017-08-10

## 2017-07-13 MED ORDER — ALBUTEROL SULFATE HFA 108 (90 BASE) MCG/ACT IN AERS: 2.0000 | INHALATION_SPRAY | Freq: Four times a day (QID) | RESPIRATORY_TRACT | 2 refills | Status: DC | PRN

## 2017-07-13 MED ORDER — FLUTTER DEVI: 0 refills | Status: AC

## 2017-07-13 NOTE — Progress Notes (Signed)
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 rec Prilosec Take 30-60 min before first meal of the day  Stop all inhalers except ventolin and use it up to 2 puffs every 4 hours if can't catch your breath Anoro open once and take two dep drags each am as soon as you get up each day      03/02/2017  f/u ov/Hymie Gorr re:  COPD II/ still smoking / no med calendar  Chief Complaint  Patient presents with  . Follow-up    She states she is getting over a cold. Pt has some rhinitis. She has been using albuterol inhaler and neb 2 x daily on average.   sleeping ok on 2 pillows and 2lpm  Very congested cough/ doesn't think her flutter is effective but not sure it's working right and didn't bring it to check out with most of the mucus thick beige esp in am's but not disctubing sleep No change doe =  MMRC3 even on 2lpm with sats typically in the low 90's when stops due to sob  rec Prednisone 10 mg take  4 each am x 2 days,   2 each am x 2 days,  1 each am x 2 days and stop  zpak  I refilled your tylenol #3    07/13/2017  f/u ov/Ferry Matthis re:  Copd GOLD II/ still smoking/ not using med cal  Chief Complaint  Patient presents with  . Follow-up    Breathing has been worswe for the past wk. Sats when she arrived today were 73%2lpm pulsed o2--increased to 93% when placed on 4lpm cont. flow o2.  She is  sleeping more and feeling dizzy all the time. She has been using her albuterol inhaler 3 x per day and has been out of her neb.   Dyspnea:  Worse x one week, 2lpm 24/8 > up to 4lpm  And sob across the room Cough: thick yellow -  Not using  flutter  Sleep: 2 pillows  SABA use:  Helps some, over using since ran out of neb saba  No obvious day to day or daytime variability or assoc  mucus plugs or hemoptysis or cp or chest tightness, subjective wheeze or overt sinus or hb symptoms. No unusual exposure hx or h/o childhood pna/ asthma or knowledge of premature birth.  Sleeping  Ok on 2 pillows/ 2lpm  without nocturnal  or early am exacerbation  of respiratory  c/o's or need for noct saba. Also denies any obvious fluctuation of symptoms with weather or environmental changes or other aggravating or alleviating factors except as outlined above   Current Allergies, Complete Past Medical History, Past Surgical History, Family History, and Social History were reviewed in [employer] record.  ROS  The following are not active complaints unless bolded Hoarseness, sore throat, dysphagia, dental problems, itching, sneezing,  nasal congestion or discharge of excess mucus or purulent secretions, ear ache,   fever, chills, sweats, unintended wt loss or wt gain, classically pleuritic or exertional cp,  orthopnea pnd or arm/hand swelling  or leg swelling, presyncope, palpitations, abdominal pain, anorexia, nausea, vomiting, diarrhea  or change in bowel habits or change in bladder habits, change in stools or change in urine, dysuria, hematuria,  rash, arthralgias, visual complaints, headache, numbness, weakness or ataxia or problems with walking or coordination,  change in mood or  memory.        Current Meds  Medication Sig  . acetaminophen-codeine (TYLENOL #3) 300-30 MG tablet TAKE 1 TABLET EVERY 4 HOURS AS NEEDED FOR COUGH  . albuterol (VENTOLIN HFA) 108 (90 Base) MCG/ACT inhaler Inhale 2  puffs into the lungs every 6 (six) hours as needed for wheezing or shortness of breath.  [name] aspirin 81 MG tablet Take 81 mg by mouth at bedtime.   [name] atorvastatin (LIPITOR) 40 MG tablet Take 40 mg by mouth at bedtime.  . benzonatate (TESSALON) 200 MG capsule Take 1 capsule (200 mg total) by mouth 3 (three) times daily as needed for cough.  . blood glucose meter kit and supplies Dispense based on patient and insurance preference. Use up to four times daily as directed. (FOR ICD-9 250.00, 250.01).  . budesonide-formoterol (SYMBICORT) 160-4.5 MCG/ACT inhaler Inhale 2 puffs into the lungs 2 (two) times daily.  . butalbital-acetaminophen-caffeine (FIORICET, ESGIC) 50-325-40 MG tablet 1 - 1 and 1/2 every 8 hours as needed for headache  . calcium carbonate (OS-CAL) 600 MG TABS Take 600 mg by mouth every morning.   . Cholecalciferol (VITAMIN D) 2000 UNITS tablet Take 2,000 Units by mouth every morning.   . clopidogrel (PLAVIX) 75 MG tablet Take 75 mg by mouth every morning.   [name] Dextromethorphan-Guaifenesin (MUCINEX FAST-MAX DM MAX) 5-100 MG/5ML LIQD 1 tsp every 4 hours as needed for cough/congestion  . Diclofenac Potassium (CAMBIA) 50 MG PACK Take at onset of headache as needed   . DULoxetine (CYMBALTA) 60 MG capsule Take 60 mg by mouth every morning.   . gabapentin (NEURONTIN) 300 MG capsule Take 300 mg by mouth 3 (three) times daily.   [name] glucose monitoring kit (FREESTYLE) monitoring kit 1 each by Does not apply route as needed for other.  . Insulin Syringe-Needle U-100 (INSULIN SYRINGE .5CC/30GX1/2") 30G X 1/2" 0.5 ML MISC 100  . Lancets (FREESTYLE) lancets Use as instructed  . letrozole (FEMARA) 2.5 MG tablet Take 2.5 mg by mouth every morning.   . metFORMIN (GLUCOPHAGE) 1000 MG tablet Take 1,000 mg by mouth 2 (two) times daily with a meal.  . methocarbamol (ROBAXIN) 750 MG tablet Take 750 mg by mouth every 8 (eight) hours as needed for muscle spasms.  . Multiple Vitamin (MULTIVITAMIN) tablet Take 1  tablet by mouth every morning.   . nitroGLYCERIN (NITROSTAT) 0.4 MG SL tablet Place 0.4 mg under the tongue every 5 (five) minutes as needed for chest pain (may repeat x3).   [name] omeprazole (PRILOSEC) 40 MG capsule Take 40 mg by mouth daily before breakfast.   . ondansetron (ZOFRAN) 8 MG tablet Take 8 mg by mouth every 4 (four) hours as needed for nausea.   . OXYGEN Use 2L 24/7  . Respiratory Therapy Supplies (FLUTTER) DEVI Use as directed  . Tiotropium Bromide Monohydrate (SPIRIVA RESPIMAT) 2.5 MCG/ACT AERS Inhale 2 puffs into  the lungs daily.  .     .                     Objective:  Physical Exam     W/c bound wf nad at rest     04/02/2013  198  >   10/02/2013   207 >  201 10/15/13 > 12/30/2013  207 >209 01/27/2014 > 05/02/2014 215>211 05/30/14 > 07/25/2014   214 > 10/31/2014  221 >213 12/09/14 >205 01/30/2015 > 02/10/2015    202 > 04/02/2015  206 >   10/08/2015   182 > 01/11/2016 177 >  04/28/2016  181 >  07/26/2016   184   > 03/02/2017  186  > 07/13/2017   185   Vital signs reviewed - Note on arrival 02 sats  93% on 4lpm        HEENT: nl dentition / oropharynx. Nl external ear canals without cough reflex - moderate bilateral non-specific turbinate edema     NECK :  without JVD/Nodes/TM/ nl carotid upstrokes bilaterally   LUNGS: no acc muscle use,  Mod barrel  contour chest wall with bilateral  Distant exp rhonchi/ cough on exp    and mod   Hyperresonant  to  percussion bilaterally     CV:  RRR  no s3 or murmur or increase in P2, and no edema   ABD:  Obese soft and nontender with  Limited insp excursion  No bruits or organomegaly appreciated, bowel sounds nl  MS:     ext warm without deformities, calf tenderness, cyanosis or clubbing No obvious joint restrictions   SKIN: warm and dry without lesions    NEURO:  alert, approp, nl sensorium with  no motor or cerebellar deficits apparent.         CXR PA and Lateral:   07/13/2017 :    I personally reviewed images and agree with  radiology impression as follows:    Slight hyper aeration with mild left basilar linear atelectasis. No definite active process.       Assessment:

## 2017-07-13 NOTE — Patient Instructions (Addendum)
zpak Prednisone 10 mg take  4 each am x 2 days,   2 each am x 2 days,  1 each am x 2 days and stop   The key is to stop smoking completely before smoking completely stops you!   Please remember to go to the  x-ray department downstairs in the basement  for your tests - we will call you with the results when they are available.      We will call advanced for BEST FIT evaluation for your 02 but for now it should be set at 4lpm    Please schedule a follow up office visit in 4 weeks, sooner if needed  with all medications /inhalers/ solutions in hand so we can verify exactly what you are taking. This includes all medications from all doctors and over the counters

## 2017-07-14 ENCOUNTER — Other Ambulatory Visit: Payer: Self-pay | Admitting: Internal Medicine

## 2017-07-14 MED ORDER — ALBUTEROL SULFATE (2.5 MG/3ML) 0.083% IN NEBU: INHALATION_SOLUTION | RESPIRATORY_TRACT | 2 refills | Status: DC

## 2017-07-16 ENCOUNTER — Encounter: Payer: Self-pay | Admitting: Internal Medicine

## 2017-07-16 NOTE — Assessment & Plan Note (Addendum)
-   pft's 12/12/12 FEV1  1.41 (53%) ratio 54 with no significant reversibility and DLCO 47% corrects to 50 01/16/2013  Walked RA x 2 laps @ 185 ft each stopped due to sob, no desat     - Trial of anoro 01/16/2013 > helped but insurance would not pay - incruse one daily started 10/02/2013 >>>not covered  -Spiriva restarted 10/15/13 > changed to [facility] 12/30/13 but not consistent with it  10/15/13 Med calendar , 01/27/2014 > lost it at f/u 05/02/14 ,  Did not understand how to use it at f/u ov 07/25/14  05/30/14 refer to pulm rehab  - 01/11/2016  extensive coaching HFA effectiveness =    90%   See aecopd/ no change maint rx

## 2017-07-16 NOTE — Assessment & Plan Note (Signed)
>   3 min Discussed the risks and costs (both direct and indirect)  of smoking relative to the benefits of quitting but patient unwilling to commit at this point to a specific quit date.       

## 2017-07-16 NOTE — Assessment & Plan Note (Addendum)
12/30/2013  See ov  - see ov 01/11/2016  rx zpak/ pred x 6 d -med calendar 01/27/2016  - see ov 07/26/2016    zpak/ pred x 6 d - see ov 03/02/2017 rx zpak/pred x 6 days/ reinstructed on use of flutter   - see ov 07/13/2017  rx zpak/ predx 6 days and replaced flutter (reported "broken")     DDX of  difficult airways management almost all start with A and  include Adherence, Ace Inhibitors, Acid Reflux, Active Sinus Disease, Alpha 1 Antitripsin deficiency, Anxiety masquerading as Airways dz,  ABPA,  Allergy(esp in young), Aspiration (esp in elderly), Adverse effects of meds,  Active smokers, A bunch of PE's (a small clot burden can't cause this syndrome unless there is already severe underlying pulm or vascular dz with poor reserve) plus two Bs  = Bronchiectasis and Beta blocker use..and one C= CHF  Adherence is always the initial "prime suspect" and is a multilayered concern that requires a "trust but verify" approach in every patient - starting with knowing how to use medications, especially inhalers, correctly, keeping up with refills and understanding the fundamental difference between maintenance and prns vs those medications only taken for a very short course and then stopped and not refilled.  - see hfa/ flutter valve teaching - return with all meds in hand using a trust but verify approach to confirm accurate Medication  Reconciliation The principal here is that until we are certain that the  patients are doing what we've asked, it makes no sense to ask them to do more.   Active smoking top of the rest of the list of suspects (see separate a/p)   ? Acid (or non-acid) GERD > always difficult to exclude as up to 75% of pts in some series report no assoc GI/ Heartburn symptoms> rec continue max (24h)  acid suppression and diet restrictions/ reviewed     ? Active sinusitis/ rhinitis/ bronchitis > zpak  ? Allergy/asthma > Prednisone 10 mg take  4 each am x 2 days,   2 each am x 2 days,  1 each am  x 2 days and stop  ? Anxiety > usually at the bottom of this list of usual suspects but should be much higher on this pt's based on H and P and note already on psychotropics and may interfere with adherence and also interpretation of response or lack thereof to symptom management which can be quite subjective.    I had an extended discussion with the patient reviewing all relevant studies completed to date and  lasting 15 to 20 minutes of a 25 minute visit    Each maintenance medication was reviewed in detail including most importantly the difference between maintenance and prns and under what circumstances the prns are to be triggered using an action plan format that is not reflected in the computer generated alphabetically organized AVS.    Please see AVS for specific instructions unique to this visit that I personally wrote and verbalized to the the pt in detail and then reviewed with pt  by my nurse highlighting any  changes in therapy recommended at today's visit to their plan of care.

## 2017-07-16 NOTE — Assessment & Plan Note (Signed)
-   See ono 09/29/13 > desat x 54 min < 89% > rec 2lpm  - 07/26/2016 Patient Saturations on Room Air at Rest = 87%---increased to 92% on 2lpm pulsed o2    As of 07/13/2017  2lpm at rest and 4lpm with activity/ warned re dangers of 02 / cigs

## 2017-08-10 ENCOUNTER — Ambulatory Visit (INDEPENDENT_AMBULATORY_CARE_PROVIDER_SITE_OTHER): Payer: Medicare Other | Admitting: Internal Medicine

## 2017-08-10 ENCOUNTER — Encounter: Payer: Self-pay | Admitting: Internal Medicine

## 2017-08-10 VITALS — BP 88/50 | HR 72 | Ht 66.0 in | Wt 176.0 lb

## 2017-08-10 DIAGNOSIS — F17210 Nicotine dependence, cigarettes, uncomplicated: Secondary | ICD-10-CM

## 2017-08-10 DIAGNOSIS — J449 Chronic obstructive pulmonary disease, unspecified: Secondary | ICD-10-CM

## 2017-08-10 NOTE — Progress Notes (Signed)
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 rec Prilosec Take 30-60 min before first meal of the day  Stop all inhalers except ventolin and use it up to 2 puffs every 4 hours if can't catch your breath Anoro open once and take two dep drags each am as soon as you get up each day      03/02/2017  f/u ov/Patricia Wall re:  COPD II/ still smoking / no med calendar  Chief Complaint  Patient presents with  . Follow-up    She states she is getting over a cold. Pt has some rhinitis. She has been using albuterol inhaler and neb 2 x daily on average.   sleeping ok on 2 pillows and 2lpm  Very congested cough/ doesn't think her flutter is effective but not sure it's working right and didn't bring it to check out with most of the mucus thick beige esp in am's but not disctubing sleep No change doe =  MMRC3 even on 2lpm with sats typically in the low 90's when stops due to sob  rec Prednisone 10 mg take  4 each am x 2 days,   2 each am x 2 days,  1 each am x 2 days and stop  zpak  I refilled your tylenol #3    07/13/2017  f/u ov/Patricia Wall re:  Copd GOLD II/ still smoking/ not using med cal  Chief Complaint  Patient presents with  . Follow-up    Breathing has been worswe for the past wk. Sats when she arrived today were 73%2lpm pulsed o2--increased to 93% when placed on 4lpm cont. flow o2.  She is  sleeping more and feeling dizzy all the time. She has been using her albuterol inhaler 3 x per day and has been out of her neb.   Dyspnea:  Worse x one week, 2lpm 24/8 > up to 4lpm  And sob across the room Cough: thick yellow -  Not using  flutter  Sleep: 2 pillows  SABA use:  Helps some, over using since ran out of neb saba rec zpak Prednisone 10 mg take  4 each am x 2 days,   2 each am x 2 days,  1 each am x 2 days and stop  The key is to stop smoking completely before smoking completely stops you!  Please remember to go to the  x-ray department downstairs in the basement  for your tests - we will call you with the results when they are available.  We will call advanced for BEST FIT evaluation for your 02 but for now it should be set at 4lpm  Please schedule a follow up office visit in 4 weeks, sooner if needed  with all medications /inhalers/ solutions in hand so we  can verify exactly what you are taking. This includes all medications from all doctors and over the counters    08/10/2017  f/u ov/Patricia Wall re: GOLD II criteria/ 02 dep/ still smoking /has med calendar doesn't follow  Chief Complaint  Patient presents with  . Follow-up    Breathing is unchanged. She states still coughing up some yellow to brown sputum.  She is using her albuterol inhaler and neb both 2 x daily on average.    Dyspnea:  Across the room on walker and then plops down off 02 (not wearing with activity as rec)  Cough: all day / not noct/ using flutter  Sleep: on L side down and 2 pillows and 3lpm  SABA use:  Bid both neb and saba (not following plan a vs b)   Diarrhea off and on x months "neg w/u" by [facility]   No obvious day to day or daytime variability or assoc excess/ purulent sputum or mucus plugs or hemoptysis or cp or chest tightness, subjective wheeze or overt sinus or hb symptoms. No unusual exposure hx or h/o childhood pna/ asthma or knowledge of premature birth.  Sleeping  As above   without  nocturnal  or early am exacerbation  of respiratory  c/o's or need for noct saba. Also denies any obvious fluctuation of symptoms with weather or environmental changes or other aggravating or alleviating factors except as outlined above   Current Allergies, Complete Past Medical History, Past Surgical History, Family History, and Social History were reviewed in [employer] record.  ROS  The following are not active complaints unless bolded Hoarseness, sore throat, dysphagia, dental problems, itching, sneezing,  nasal congestion or discharge of excess mucus or purulent secretions, ear ache,   fever, chills, sweats, unintended wt loss or wt gain, classically pleuritic or exertional cp,  orthopnea pnd or arm/hand swelling  or leg swelling, presyncope, palpitations, abdominal pain, anorexia, nausea, vomiting, diarrhea  or change in bowel habits or change in bladder habits, change in stools or change in urine, dysuria, hematuria,  rash, arthralgias, visual complaints, headache, numbness, weakness or ataxia or problems with walking or coordination using walker at home/ wc to go out,  change in mood or  memory.        Current Meds  Medication Sig  . acetaminophen-codeine (TYLENOL #3) 300-30 MG tablet TAKE 1 TABLET EVERY 4 HOURS AS NEEDED FOR COUGH  . albuterol (PROVENTIL) (2.5 MG/3ML) 0.083% nebulizer solution USE 1 VIAL IN NEBULIZER EVERY 4 HOURS AS NEEDED FOR WHEEZING OR SHORTNESS OF BREATH.  [name] albuterol (VENTOLIN HFA) 108 (90 Base) MCG/ACT inhaler Inhale 2 puffs into the lungs every 6 (six) hours as needed for wheezing or shortness of breath.  [name] aspirin 81 MG tablet Take 81 mg by mouth at bedtime.   [name] atorvastatin (LIPITOR) 40 MG tablet Take 40 mg by mouth at bedtime.  . benzonatate (TESSALON) 200 MG capsule Take 1 capsule (200 mg total) by mouth 3 (three) times daily as needed for cough.  . blood glucose meter kit and supplies Dispense based on patient and insurance preference.  Use up to four times daily as directed. (FOR ICD-9 250.00, 250.01).  . budesonide-formoterol (SYMBICORT) 160-4.5 MCG/ACT inhaler Inhale 2 puffs into the lungs 2 (two) times daily.  . butalbital-acetaminophen-caffeine (FIORICET, ESGIC) 50-325-40 MG tablet 1 - 1 and 1/2 every 8 hours as needed for headache  . calcium carbonate (OS-CAL) 600 MG TABS Take 600 mg by mouth every morning.   [name]  Cholecalciferol (VITAMIN D) 2000 UNITS tablet Take 2,000 Units by mouth every morning.   . clopidogrel (PLAVIX) 75 MG tablet Take 75 mg by mouth every morning.   [name] Dextromethorphan-Guaifenesin (MUCINEX FAST-MAX DM MAX) 5-100 MG/5ML LIQD 1 tsp every 4 hours as needed for cough/congestion  . Diclofenac Potassium (CAMBIA) 50 MG PACK Take at onset of headache as needed   . DULoxetine (CYMBALTA) 60 MG capsule Take 60 mg by mouth every morning.   . gabapentin (NEURONTIN) 300 MG capsule Take 300 mg by mouth 3 (three) times daily.   [name] glucose monitoring kit (FREESTYLE) monitoring kit 1 each by Does not apply route as needed for other.  . Insulin Syringe-Needle U-100 (INSULIN SYRINGE .5CC/30GX1/2") 30G X 1/2" 0.5 ML MISC 100  . Lancets (FREESTYLE) lancets Use as instructed  . letrozole (FEMARA) 2.5 MG tablet Take 2.5 mg by mouth every morning.   . metFORMIN (GLUCOPHAGE) 1000 MG tablet Take 1,000 mg by mouth 2 (two) times daily with a meal.  . methocarbamol (ROBAXIN) 750 MG tablet Take 750 mg by mouth every 8 (eight) hours as needed for muscle spasms.  . Multiple Vitamin (MULTIVITAMIN) tablet Take 1 tablet by mouth every morning.   . nitroGLYCERIN (NITROSTAT) 0.4 MG SL tablet Place 0.4 mg under the tongue every 5 (five) minutes as needed for chest pain (may repeat x3).   [name] omeprazole (PRILOSEC) 40 MG capsule Take 40 mg by mouth daily before breakfast.   . ondansetron (ZOFRAN) 8 MG tablet Take 8 mg by mouth every 4 (four) hours as needed for nausea.   . OXYGEN Use 2L 24/7  . Respiratory Therapy Supplies (FLUTTER) DEVI Use as  directed  . Tiotropium Bromide Monohydrate (SPIRIVA RESPIMAT) 2.5 MCG/ACT AERS Inhale 2 puffs into the lungs daily.                  Objective:  Physical Exam     W/c bound nad     04/02/2013  198  >   10/02/2013   207 >  201 10/15/13 > 12/30/2013  207 >209 01/27/2014 > 05/02/2014 215>211 05/30/14 > 07/25/2014   214 > 10/31/2014  221 >213 12/09/14 >205 01/30/2015 > 02/10/2015    202 > 04/02/2015  206 >   10/08/2015   182 > 01/11/2016 177 >  04/28/2016  181 >  07/26/2016   184   > 03/02/2017  186  > 07/13/2017   185 > 08/10/2017   176       Vital signs reviewed - Note on arrival 02 sats  93% on  3lpm 24/7    And note BP 88/50       HEENT: nl dentition / oropharynx. Nl external ear canals without cough reflex - moderate bilateral non-specific turbinate edema     Distant exp rhonchi/ cough on exp   ABD:  Obese soft and nontender with  Limited insp excursion       HEENT: nl dentition / oropharynx. Nl external ear canals without cough reflex - moderate bilateral non-specific turbinate edema     NECK :  without JVD/Nodes/TM/ nl carotid upstrokes bilaterally   LUNGS: no acc muscle use,  Mod barrel  contour chest wall with bilateral  Distant bs s audible wheeze and  without cough on insp or exp maneuver and mod   Hyperresonant  to  percussion bilaterally     CV:  RRR  no s3 or murmur or increase in P2, and no edema   ABD:  Obese  and nontender with  Limited insp excursion. No bruits or organomegaly appreciated, bowel sounds nl  MS:     ext warm without deformities, calf tenderness, cyanosis or clubbing No obvious joint restrictions   SKIN: warm and dry without lesions    NEURO:  alert, approp, nl sensorium with  no motor or cerebellar deficits apparent.            Assessment:

## 2017-08-10 NOTE — Patient Instructions (Addendum)
Goal is to keep 02 sat above 90% all the time   Plan A = Automatic = symbicort 160 Take 2 puffs first thing in am and then another 2 puffs about 12 hours later Spiriva 2 pffs after symbicort am dose only   Work on maintaining inhaler technique:  relax and gently blow all the way out then take a nice smooth deep breath back in, triggering the inhaler at same time you start breathing in.  Hold for up to 5 seconds if you can. Blow out thru nose. Rinse and gargle with water when done    Plan B = Backup Only use your albuterol (Ventolin) as a rescue medication to be used if you can't catch your breath by resting or doing a relaxed purse lip breathing pattern.  - The less you use it, the better it will work when you need it. - Ok to use the inhaler up to 2 puffs  every 4 hours if you must but call for appointment if use goes up over your usual need - Don't leave home without it !!  (think of it like the spare tire for your car)    Plan C = Crisis - only use your albuterol nebulizer if you first try Plan B and it fails to help > ok to use the nebulizer up to every 4 hours but if start needing it regularly call for immediate appointment   The key is to stop smoking completely before smoking completely stops you!   Avoid dairy products and undercooked vegetables and salads and follow up with your GI doctor if diarrhea not better also could try off prilosec and just take pepcid 20 mg twice daily    Please schedule a follow up visit in 3 months but call sooner if needed

## 2017-08-11 ENCOUNTER — Encounter: Payer: Self-pay | Admitting: Internal Medicine

## 2017-08-11 NOTE — Assessment & Plan Note (Signed)
-   pft's 12/12/12 FEV1  1.41 (53%) ratio 54 with no significant reversibility and DLCO 47% corrects to 50 01/16/2013  Walked RA x 2 laps @ 185 ft each stopped due to sob, no desat     - Trial of anoro 01/16/2013 > helped but insurance would not pay - incruse one daily started 10/02/2013 >>>not covered  -Spiriva restarted 10/15/13 > changed to [facility] 12/30/13 but not consistent with it  10/15/13 Med calendar , 01/27/2014 > lost it at f/u 05/02/14 ,  Did not understand how to use it at f/u ov 07/25/14  05/30/14 refer to pulm rehab  - 07/13/2017  Restarted flutter valve prn  - 08/10/2017  After extensive coaching inhaler device  effectiveness =    90% from baseline 75%   I suspect she has progressed to GOLD III criteria by now but still  Group D in terms of symptom/risk and laba/lama/ICS  therefore appropriate rx at this point and no change in rx indicated even if she is GOLD III   Due to chronic diarrhea no need to be aggressive with rx of "brown sputum" chronically as likely just the effects of smoking and not bacterial bronchitis of any kind  I had an extended discussion with the patient reviewing all relevant studies completed to date and  lasting 15 to 20 minutes of a 25 minute visit    See device teaching which extended face to face time for this visit.  Each maintenance medication was reviewed in detail including emphasizing most importantly the difference between maintenance and prns and under what circumstances the prns are to be triggered using an action plan format that is not reflected in the computer generated alphabetically organized AVS which I have not found useful in most complex patients, especially with respiratory illnesses  Please see AVS for specific instructions unique to this visit that I personally wrote and verbalized to the the pt in detail and then reviewed with pt  by my nurse highlighting any  changes in therapy recommended at today's visit to their plan of care.

## 2018-01-30 NOTE — Progress Notes (Signed)
Cardiology Office Note   Date:  02/02/2018   ID:  Patricia Wall, DOB Nov 26, 1950, MRN [id number]  PCP:  Bernerd Limbo  Cardiologist:   Jenkins Rouge, MD   No chief complaint on file.     History of Present Illness: Patricia Wall is a 67 y.o. female who presents for consultation regarding CAD. Referred by Dr Melvyn Novas in pulmonary. Last seen by cardiology 2016.  Previous stent to LAD and distal circumflex  And OM2 PCI in 2005.  ISR with balloon Rx 2006.  She had patent stents by cath June 2011 Recurrent SSCP with restenting of circumflex September 2015. Also has PVD with history of 73-40% LICA stenosis that has not been checked since 2016 TTE reviewed from 2016 and EF 65-70% estimated PA 38 mmHg no valve disease   Severe COPD on oxygen. FEV1 1.41 DLCO 47% Needs 4L with activity Still smoking   She is limited due to her breathing. She gets a tightness in her chest not related to exertion that Can last 5 minutes Pain is not pleuritic or positional Resolves spontaneously     Past Medical History:  Diagnosis Date  . Abnormal chest xray   . Alcohol abuse   . Back pain   . CAD (coronary artery disease)     Prev seen by [facility].  Stent to OM and LAD Last cath 6/11 cathed on June 13, [name]. 2011.  The cardiac catheterization showed 30% and 50% lesions in the mid  and distal LAD, 50% in the OM1, 40% in the OM2 with 20% in-stent  restenosis, 40% in-stent restenosis in the circumflex and no significant  disease in the RCA.  Her EF was normal.  Dr. Olevia Perches recommended a trial  of proton pump inhibitors   . Chest pain   . COPD (chronic obstructive pulmonary disease) ([place])   . Cyanosis   . DDD (degenerative disc disease)   . Depression   . Diarrhea   . Diverticular disease   . GERD (gastroesophageal reflux disease)   . H/O: hysterectomy   . Hemorrhoids   . HTN (hypertension)   . IBS (irritable bowel syndrome)   . Insomnia   . Lymphadenitis   . Pharyngitis   . PUD (peptic ulcer disease)     . PVD (peripheral vascular disease) ([place])   . Sinusitis   . TIA (transient ischemic attack)   . Tobacco abuse   . Vertigo     Past Surgical History:  Procedure Laterality Date  . ABDOMINAL HYSTERECTOMY    . APPENDECTOMY    . BREAST LUMPECTOMY Left 2015  . CARDIAC CATHETERIZATION  2005, 2006, 2009,2011  . CHOLECYSTECTOMY    . CORONARY STENT PLACEMENT     Status post balloon angiogram plasty and Cypher stent to the distal    circumflex and OM2 branch  . Hammertoe repair    . Left Bunionectomy    . LEFT HEART CATHETERIZATION WITH CORONARY ANGIOGRAM N/A 12/03/2013   Procedure: LEFT HEART CATHETERIZATION WITH CORONARY ANGIOGRAM;  Surgeon: Leonie Man, MD;  Location: [facility] C[facility] LAB;  Service: Cardiovascular;  Laterality: N/A;  . TUBAL LIGATION       Current Outpatient Medications  Medication Sig Dispense Refill  . acetaminophen-codeine (TYLENOL #3) 300-30 MG tablet TAKE 1 TABLET EVERY 4 HOURS AS NEEDED FOR COUGH 40 tablet 0  . albuterol (PROVENTIL) (2.5 MG/3ML) 0.083% nebulizer solution USE 1 VIAL IN NEBULIZER EVERY 4 HOURS AS NEEDED FOR WHEEZING OR SHORTNESS OF BREATH. 300 mL  2  . albuterol (VENTOLIN HFA) 108 (90 Base) MCG/ACT inhaler Inhale 2 puffs into the lungs every 6 (six) hours as needed for wheezing or shortness of breath. 1 Inhaler 2  . aspirin 81 MG tablet Take 81 mg by mouth at bedtime.     [name] atorvastatin (LIPITOR) 40 MG tablet Take 40 mg by mouth at bedtime.    . benzonatate (TESSALON) 200 MG capsule Take 1 capsule (200 mg total) by mouth 3 (three) times daily as needed for cough. 30 capsule 1  . blood glucose meter kit and supplies Dispense based on patient and insurance preference. Use up to four times daily as directed. (FOR ICD-9 250.00, 250.01). 1 each 0  . budesonide-formoterol (SYMBICORT) 160-4.5 MCG/ACT inhaler Inhale 2 puffs into the lungs 2 (two) times daily. 10.6 g 5  . butalbital-acetaminophen-caffeine (FIORICET, ESGIC) 50-325-40 MG tablet 1 - 1 and 1/2 every 8  hours as needed for headache    . calcium carbonate (OS-CAL) 600 MG TABS Take 600 mg by mouth every morning.     . Cholecalciferol (VITAMIN D) 2000 UNITS tablet Take 2,000 Units by mouth every morning.     . clopidogrel (PLAVIX) 75 MG tablet Take 75 mg by mouth every morning.     [name] Dextromethorphan-Guaifenesin (MUCINEX FAST-MAX DM MAX) 5-100 MG/5ML LIQD 1 tsp every 4 hours as needed for cough/congestion    . Diclofenac Potassium (CAMBIA) 50 MG PACK Take at onset of headache as needed     . donepezil (ARICEPT) 5 MG tablet Take 1 tablet by mouth daily.    . DULoxetine (CYMBALTA) 60 MG capsule Take 60 mg by mouth every morning.     Eduard Roux (AIMOVIG [place]) Inject into the skin as directed.    . gabapentin (NEURONTIN) 300 MG capsule Take 300 mg by mouth 3 (three) times daily.     [name] glucose monitoring kit (FREESTYLE) monitoring kit 1 each by Does not apply route as needed for other. 1 each 1  . Insulin Syringe-Needle U-100 (INSULIN SYRINGE .5CC/30GX1/2") 30G X 1/2" 0.5 ML MISC 100 100 each 0  . Lancets (FREESTYLE) lancets Use as instructed 100 each 12  . letrozole (FEMARA) 2.5 MG tablet Take 2.5 mg by mouth every morning.     . metFORMIN (GLUCOPHAGE) 1000 MG tablet Take 1,000 mg by mouth 2 (two) times daily with a meal.    . methocarbamol (ROBAXIN) 750 MG tablet Take 750 mg by mouth every 8 (eight) hours as needed for muscle spasms.    . Multiple Vitamin (MULTIVITAMIN) tablet Take 1 tablet by mouth every morning.     . nitroGLYCERIN (NITROSTAT) 0.4 MG SL tablet Place 1 tablet (0.4 mg total) under the tongue every 5 (five) minutes as needed for chest pain (may repeat x3). 25 tablet 2  . omeprazole (PRILOSEC) 40 MG capsule Take 40 mg by mouth daily before breakfast.     . ondansetron (ZOFRAN) 8 MG tablet Take 8 mg by mouth every 4 (four) hours as needed for nausea.     . OXYGEN Use 2L 24/7    . pantoprazole (PROTONIX) 40 MG tablet Take 1 tablet by mouth daily.    [name] Respiratory Therapy Supplies  (FLUTTER) DEVI Use as directed 1 each 0  . Tiotropium Bromide Monohydrate (SPIRIVA RESPIMAT) 2.5 MCG/ACT AERS Inhale 2 puffs into the lungs daily. 1 Inhaler 5   No current facility-administered medications for this visit.     Allergies:   Patient has no known allergies.  Social History:  The patient  reports that she has been smoking cigarettes. She has a 104.00 pack-year smoking history. She has never used smokeless tobacco. She reports that she has current or past drug history. She reports that she does not drink alcohol.   Family History:  The patient's family history includes Asthma in her sister; Cancer - Other in her father and mother; Coronary artery disease in her unknown relative; Emphysema in her father; Heart disease in her father and mother; Rheumatologic disease in her mother; Stroke in her sister.    ROS:  Please see the history of present illness.   Otherwise, review of systems are positive for none.   All other systems are reviewed and negative.    PHYSICAL EXAM: VS:  BP 92/60   Pulse (!) 101   Ht _0  (1.676 m)   Wt 175 lb 8 oz (79.6 kg)   SpO2 98% Comment: on oxygen  BMI 28.33 kg/m  , BMI Body mass index is 28.33 kg/m. Affect appropriate Healthy:  appears stated age 7: normal Neck supple with no adenopathy JVP normal  Left  bruits no thyromegaly Lungs no active wheezing poor air movement  Heart:  S1/S2 no murmur, no rub, gallop or click PMI normal Abdomen: benighn, BS positve, no tenderness, no AAA no bruit.  No HSM or HJR Distal pulses intact with no bruits No edema Neuro non-focal Skin warm and dry No muscular weakness    EKG:  SR low voltage normal 2016   Recent Labs: No results found for requested labs within last 8760 hours.    Lipid Panel    Component Value Date/Time   CHOL  08/29/2009 0849    157        ATP III CLASSIFICATION:  <200     mg/dL   Desirable  200-239  mg/dL   Borderline High  >=240    mg/dL   High          TRIG  313 (H) 08/29/2009 0849   HDL 31 (L) 08/29/2009 0849   CHOLHDL 5.1 08/29/2009 0849   VLDL 63 (H) 08/29/2009 0849   LDLCALC  08/29/2009 0849    63        Total Cholesterol/HDL:CHD Risk Coronary Heart Disease Risk Table                     Men   Women  1/2 Average Risk   3.4   3.3  Average Risk       5.0   4.4  2 X Average Risk   9.6   7.1  3 X Average Risk  23.4   11.0        Use the calculated Patient Ratio above and the CHD Risk Table to determine the patient's CHD Risk.        ATP III CLASSIFICATION (LDL):  <100     mg/dL   Optimal  100-129  mg/dL   Near or Above                    Optimal  130-159  mg/dL   Borderline  160-189  mg/dL   High  >190     mg/dL   Very High      Wt Readings from Last 3 Encounters:  02/02/18 175 lb 8 oz (79.6 kg)  08/10/17 176 lb (79.8 kg)  07/13/17 185 lb 6.4 oz (84.1 kg)      Other studies Reviewed: Additional studies/  records that were reviewed today include: Cardiology notes 2016 Multiple previous caths, TTE 2016 nots from pulmonary .CXR 07/13/17    ASSESSMENT AND PLAN:  1.  CAD: post multiple previous stents / PCI's mostly to circumflex but also LAD last being in 2015  Will  Call in SL nitro to see if helps Medical Rx would be limited by her low BP. Daughter and patient understand That her quality of life is limited by her COPD and would like to avoid stress testing or cath if possible Will re evaluate in 3 months see if nitro helps pain She knows to call us if symptoms worsen  2. PVD:  Needs f/u cuplex for 41-58% LICA stenosis  No TIA symptoms on Plavix  3. COPD:  F/u Dr Theresia Majors 3 on oxygen discussed smoking cessation She understands that this is slowly killing her  4. HLD:  Continue statin labs with primary  5. DM:  Discussed low carb diet.  Target hemoglobin A1c is 6.5 or less.  Continue current medications.    Current medicines are reviewed at length with the patient today.  The patient does not have concerns regarding  medicines.  The following changes have been made:  SL nitro  Labs/ tests ordered today include: Carotid duplex   Orders Placed This Encounter  Procedures  . EKG 12-Lead     Disposition:   FU with me in 3 months     Signed, Jenkins Rouge, MD  02/02/2018 11:11 AM    [facility] [place], [place], [place]  [address] Phone: [phone]; Fax: [phone]

## 2018-02-02 ENCOUNTER — Encounter: Payer: Self-pay | Admitting: Cardiovascular Disease

## 2018-02-02 ENCOUNTER — Ambulatory Visit (INDEPENDENT_AMBULATORY_CARE_PROVIDER_SITE_OTHER): Payer: Medicare Other | Admitting: Cardiovascular Disease

## 2018-02-02 ENCOUNTER — Encounter

## 2018-02-02 VITALS — BP 92/60 | HR 101 | Ht 66.0 in | Wt 175.5 lb

## 2018-02-02 DIAGNOSIS — I2510 Atherosclerotic heart disease of native coronary artery without angina pectoris: Secondary | ICD-10-CM | POA: Diagnosis not present

## 2018-02-02 DIAGNOSIS — E785 Hyperlipidemia, unspecified: Secondary | ICD-10-CM

## 2018-02-02 DIAGNOSIS — R0989 Other specified symptoms and signs involving the circulatory and respiratory systems: Secondary | ICD-10-CM | POA: Diagnosis not present

## 2018-02-02 DIAGNOSIS — I739 Peripheral vascular disease, unspecified: Secondary | ICD-10-CM | POA: Diagnosis not present

## 2018-02-02 MED ORDER — NITROGLYCERIN 0.4 MG SL SUBL: 0.4000 mg | SUBLINGUAL_TABLET | SUBLINGUAL | 2 refills | Status: DC | PRN

## 2018-02-02 NOTE — Patient Instructions (Addendum)
Medication Instructions:   If you need a refill on your cardiac medications before your next appointment, please call your pharmacy.   Lab work:  If you have labs (blood work) drawn today and your tests are completely normal, you will receive your results only by: [name]. MyChart Message (if you have MyChart) OR . A paper copy in the mail If you have any lab test that is abnormal or we need to change your treatment, we will call you to review the results.  Testing/Procedures: NONE ordered at this time  Follow-Up: At [facility], you and your health needs are our priority.  As part of our continuing mission to provide you with exceptional heart care, we have created designated Provider Care Teams.  These Care Teams include your primary Cardiologist (physician) and Advanced Practice Providers (APPs -  Physician Assistants and Nurse Practitioners) who all work together to provide you with the care you need, when you need it. . You will need a follow up appointment in 3 months.with Dr. Eden EmmsNishan.

## 2018-02-08 ENCOUNTER — Ambulatory Visit (HOSPITAL_COMMUNITY)
Admission: RE | Admit: 2018-02-08 | Discharge: 2018-02-08 | Disposition: A | Discharge status: Home / Self Care | Payer: Medicare Other | Source: Ambulatory Visit | Attending: Cardiovascular Disease | Admitting: Cardiovascular Disease

## 2018-02-08 DIAGNOSIS — R0989 Other specified symptoms and signs involving the circulatory and respiratory systems: Secondary | ICD-10-CM | POA: Insufficient documentation

## 2018-02-09 ENCOUNTER — Telehealth: Payer: Self-pay

## 2018-02-09 DIAGNOSIS — I6523 Occlusion and stenosis of bilateral carotid arteries: Secondary | ICD-10-CM

## 2018-02-09 NOTE — Telephone Encounter (Signed)
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  F/U carotid duplex in 6 months

## 2018-02-09 NOTE — Telephone Encounter (Signed)
Patient aware of carotid results.  Per Dr. 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  F/U carotid duplex in 6 months. Patient verbalized understanding. Will place order for carotid duplex in 6 months.

## 2018-03-26 ENCOUNTER — Ambulatory Visit (INDEPENDENT_AMBULATORY_CARE_PROVIDER_SITE_OTHER): Payer: Medicare Other | Admitting: Internal Medicine

## 2018-03-26 ENCOUNTER — Encounter: Payer: Self-pay | Admitting: Internal Medicine

## 2018-03-26 VITALS — BP 112/58 | HR 94 | Ht 66.0 in | Wt 174.0 lb

## 2018-03-26 DIAGNOSIS — F17210 Nicotine dependence, cigarettes, uncomplicated: Secondary | ICD-10-CM

## 2018-03-26 DIAGNOSIS — J9611 Chronic respiratory failure with hypoxia: Secondary | ICD-10-CM | POA: Diagnosis not present

## 2018-03-26 DIAGNOSIS — J449 Chronic obstructive pulmonary disease, unspecified: Secondary | ICD-10-CM

## 2018-03-26 DIAGNOSIS — J9612 Chronic respiratory failure with hypercapnia: Secondary | ICD-10-CM

## 2018-03-26 NOTE — Patient Instructions (Signed)
Please remember to go to the lab department   for your tests - we will call you with the results when they are available.      No change on 02 > goal is to keep sats in low 90's   Please schedule a follow up visit in 6 months but call sooner if needed

## 2018-03-26 NOTE — Progress Notes (Signed)
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 rec Prilosec Take 30-60 min before first meal of the day  Stop all inhalers except ventolin and use it up to 2 puffs every 4 hours if can't catch your breath Anoro open once and take two dep drags each am as soon as you get up each day      03/02/2017  f/u ov/[name] re:  COPD II/ still smoking / no med calendar  Chief Complaint  Patient presents with  . Follow-up    She states she is getting over a cold. Pt has some rhinitis. She has been using albuterol inhaler and neb 2 x daily on average.   sleeping ok on 2 pillows and 2lpm  Very congested cough/ doesn't think her flutter is effective but not sure it's working right and didn't bring it to check out with most of the mucus thick beige esp in am's but not disctubing sleep No change doe =  MMRC3 even on 2lpm with sats typically in the low 90's when stops due to sob  rec Prednisone 10 mg take  4 each am x 2 days,   2 each am x 2 days,  1 each am x 2 days and stop  zpak  I refilled your tylenol #3    07/13/2017  f/u ov/[name] re:  Copd GOLD II/ still smoking/ not using med cal  Chief Complaint  Patient presents with  . Follow-up    Breathing has been worswe for the past wk. Sats when she arrived today were 73%2lpm pulsed o2--increased to 93% when placed on 4lpm cont. flow o2.  She is  sleeping more and feeling dizzy all the time. She has been using her albuterol inhaler 3 x per day and has been out of her neb.   Dyspnea:  Worse x one week, 2lpm 24/8 > up to 4lpm  And sob across the room Cough: thick yellow -  Not using  flutter  Sleep: 2 pillows  SABA use:  Helps some, over using since ran out of neb saba rec Goal is to keep 02 sat above 90% all the time   Plan A = Automatic = symbicort 160 Take 2 puffs first thing in am and then another 2 puffs about 12 hours later Spiriva 2 pffs after symbicort am dose only  Work on maintaining inhaler technique: Only use your albuterol (Ventolin) as a rescue medication Plan C = Crisis - only use your albuterol nebulizer if you first try Plan B The key is to stop smoking completely before smoking completely stops you!  Avoid dairy products and undercooked vegetables and salads and follow up with your GI doctor if diarrhea not better also could try off prilosec and just take pepcid 20  mg twice daily        03/26/2018  f/u ov/[name] re:  GOLD II copd/ still smoking ? aecopd  Chief Complaint  Patient presents with  . Acute Visit    Increased SOB x 1 wk. She has had occ pressure and tightness in her chest. She is having episodes where she starts shaking and feels like she can not move. She is using her albuterol inhaler about once per day and used neb last about 2 wks ago.   Dyspnea:  Room to room on 4lpm  Cough: nl for her =sporadic / worse p supper / min mucoid  Sleeping: bed flat, two pillows  SABA use: hfa  02: 4lpm 24/7 sats typiclly low 90s   No obvious day to day or daytime variability or assoc excess/ purulent sputum or mucus plugs or hemoptysis or cp or chest tightness, subjective wheeze or overt sinus or hb symptoms.   Sleeping as above without nocturnal  or early am exacerbation  of respiratory  c/o's or need for noct saba. Also denies any obvious fluctuation of symptoms with weather or environmental changes or other  aggravating or alleviating factors except as outlined above   No unusual exposure hx or h/o childhood pna/ asthma or knowledge of premature birth.  Current Allergies, Complete Past Medical History, Past Surgical History, Family History, and Social History were reviewed in [employer] record.  ROS  The following are not active complaints unless bolded Hoarseness, sore throat, dysphagia, dental problems, itching, sneezing,  nasal congestion or discharge of excess mucus or purulent secretions, ear ache,   fever, chills, sweats, unintended wt loss or wt gain, classically pleuritic or exertional cp,  orthopnea pnd or arm/hand swelling  or leg swelling, presyncope, palpitations, abdominal pain, anorexia, nausea, vomiting, diarrhea  or change in bowel habits or change in bladder habits, change in stools or change in urine, dysuria, hematuria,  rash, arthralgias, visual complaints, headache, numbness, weakness or ataxia or problems with walking or coordination,  change in mood or  memory.        Current Meds  Medication Sig  . acetaminophen-codeine (TYLENOL #3) 300-30 MG tablet TAKE 1 TABLET EVERY 4 HOURS AS NEEDED FOR COUGH  . albuterol (PROVENTIL) (2.5 MG/3ML) 0.083% nebulizer solution USE 1 VIAL IN NEBULIZER EVERY 4 HOURS AS NEEDED FOR WHEEZING OR SHORTNESS OF BREATH.  [name] albuterol (VENTOLIN HFA) 108 (90 Base) MCG/ACT inhaler Inhale 2 puffs into the lungs every 6 (six) hours as needed for wheezing or shortness of breath.  [name] aspirin 81 MG tablet Take 81 mg by mouth at bedtime.   [name] atorvastatin (LIPITOR) 80 MG tablet Take 80 mg by mouth daily.  . blood glucose meter kit and supplies Dispense based on patient and insurance preference. Use up to four times daily as directed. (FOR ICD-9 250.00, 250.01).  . budesonide-formoterol (SYMBICORT) 160-4.5 MCG/ACT inhaler Inhale 2 puffs into the lungs 2 (two) times daily.  . butalbital-acetaminophen-caffeine (FIORICET, ESGIC) 50-325-40 MG  tablet 1 - 1 and 1/2 every 8 hours as needed for headache  . calcium carbonate (OS-CAL) 600 MG TABS Take 600 mg by mouth every morning.   . Cholecalciferol (VITAMIN D) 2000 UNITS tablet Take 2,000 Units by mouth every morning.   . clopidogrel (PLAVIX) 75 MG tablet Take 75 mg by mouth every morning.   . Diclofenac Potassium (CAMBIA) 50 MG PACK Take at onset of headache as needed   . donepezil (ARICEPT) 5 MG tablet Take 1 tablet by  mouth daily.  . DULoxetine (CYMBALTA) 60 MG capsule Take 60 mg by mouth every morning.   Eduard Roux (AIMOVIG Box) Inject into the skin as directed.  . gabapentin (NEURONTIN) 300 MG capsule Take 300 mg by mouth 3 (three) times daily.   . Lancets (FREESTYLE) lancets Use as instructed  . letrozole (FEMARA) 2.5 MG tablet Take 2.5 mg by mouth every morning.   . metFORMIN (GLUCOPHAGE) 1000 MG tablet Take 1,000 mg by mouth 2 (two) times daily with a meal.  . methocarbamol (ROBAXIN) 750 MG tablet Take 750 mg by mouth every 8 (eight) hours as needed for muscle spasms.  . Multiple Vitamin (MULTIVITAMIN) tablet Take 1 tablet by mouth every morning.   . nitroGLYCERIN (NITROSTAT) 0.4 MG SL tablet Place 1 tablet (0.4 mg total) under the tongue every 5 (five) minutes as needed for chest pain (may repeat x3).  [name] omeprazole (PRILOSEC) 40 MG capsule Take 40 mg by mouth daily before breakfast.   . ondansetron (ZOFRAN) 8 MG tablet Take 8 mg by mouth every 4 (four) hours as needed for nausea.   . OXYGEN Use 4L 24/7  . pantoprazole (PROTONIX) 40 MG tablet Take 1 tablet by mouth daily.  [name] Respiratory Therapy Supplies (FLUTTER) DEVI Use as directed  . Tiotropium Bromide Monohydrate (SPIRIVA RESPIMAT) 2.5 MCG/ACT AERS Inhale 2 puffs into the lungs daily.                      Objective:  Physical Exam   W/c bound wf  nad - she looks chronically but not acutely ill     04/02/2013  198  >   10/02/2013   207 >  201 10/15/13 > 12/30/2013  207 >209 01/27/2014 > 05/02/2014 215>211  05/30/14 > 07/25/2014   214 > 10/31/2014  221 >213 12/09/14 >205 01/30/2015 > 02/10/2015    202 > 04/02/2015  206 >   10/08/2015   182 > 01/11/2016 177 >  04/28/2016  181 >  07/26/2016   184   > 03/02/2017  186  > 07/13/2017   185 > 03/26/2018   174   Vital signs reviewed - Note on arrival 02 sats  100% on 3lpm continuous        HEENT: nl dentition / oropharynx. Nl external ear canals without cough reflex -  Mild bilateral non-specific turbinate edema     NECK :  without JVD/Nodes/TM/ nl carotid upstrokes bilaterally   LUNGS: no acc muscle use,  Mod barrel  contour chest wall with bilateral  Distant bs s audible wheeze and  without cough on insp or exp maneuver and mod  Hyperresonant  to  percussion bilaterally     CV:  RRR  no s3 or murmur or increase in P2, and no edema   ABD:  soft and nontender with pos mid insp Hoover's  in the supine position. No bruits or organomegaly appreciated, bowel sounds nl  MS:     ext warm without deformities, calf tenderness, cyanosis or clubbing No obvious joint restrictions   SKIN: warm and dry without lesions    NEURO:  alert, approp, nl sensorium with  no motor or cerebellar deficits apparent.       Labs ordered/ reviewed:      Chemistry      Component Value Date/Time   NA 140 03/26/2018 1706   K 4.1 03/26/2018 1706   CL 96 03/26/2018 1706   CO2 43 (H) 03/26/2018 1706   BUN 11  03/26/2018 1706   CREATININE 0.74 03/26/2018 1706      Component Value Date/Time   CALCIUM 9.1 03/26/2018 1706                             Lab Results  Component Value Date   WBC 6.9 03/26/2018   HGB 11.4 (L) 03/26/2018   HCT 34.3 (L) 03/26/2018   MCV 91.1 03/26/2018   PLT 181.0 03/26/2018                     Assessment:

## 2018-03-27 LAB — CBC WITH DIFFERENTIAL/PLATELET
BASOS ABS: 0.1 10*3/uL (ref 0.0–0.1)
Basophils Relative: 0.8 % (ref 0.0–3.0)
EOS PCT: 6.1 % — AB (ref 0.0–5.0)
Eosinophils Absolute: 0.4 10*3/uL (ref 0.0–0.7)
HCT: 34.3 % — ABNORMAL LOW (ref 36.0–46.0)
HEMOGLOBIN: 11.4 g/dL — AB (ref 12.0–15.0)
Lymphocytes Relative: 23.7 % (ref 12.0–46.0)
Lymphs Abs: 1.6 10*3/uL (ref 0.7–4.0)
MCHC: 33.2 g/dL (ref 30.0–36.0)
MCV: 91.1 fl (ref 78.0–100.0)
Monocytes Absolute: 0.6 10*3/uL (ref 0.1–1.0)
Monocytes Relative: 8 % (ref 3.0–12.0)
Neutro Abs: 4.2 10*3/uL (ref 1.4–7.7)
Neutrophils Relative %: 61.4 % (ref 43.0–77.0)
Platelets: 181 10*3/uL (ref 150.0–400.0)
RBC: 3.77 Mil/uL — AB (ref 3.87–5.11)
RDW: 13.8 % (ref 11.5–15.5)
WBC: 6.9 10*3/uL (ref 4.0–10.5)

## 2018-03-27 LAB — BASIC METABOLIC PANEL
BUN: 11 mg/dL (ref 6–23)
CO2: 43 mEq/L — ABNORMAL HIGH (ref 19–32)
Calcium: 9.1 mg/dL (ref 8.4–10.5)
Chloride: 96 mEq/L (ref 96–112)
Creatinine, Ser: 0.74 mg/dL (ref 0.40–1.20)
GFR: 82.51 mL/min (ref >60.00)
Glucose, Bld: 87 mg/dL (ref 70–99)
POTASSIUM: 4.1 meq/L (ref 3.5–5.1)
SODIUM: 140 meq/L (ref 135–145)

## 2018-03-27 NOTE — Progress Notes (Signed)
Spoke with pt and notified of results per Dr. Wert. Pt verbalized understanding and denied any questions. 

## 2018-03-29 ENCOUNTER — Encounter: Payer: Self-pay | Admitting: Internal Medicine

## 2018-03-29 DIAGNOSIS — J9611 Chronic respiratory failure with hypoxia: Secondary | ICD-10-CM | POA: Insufficient documentation

## 2018-03-29 DIAGNOSIS — J9612 Chronic respiratory failure with hypercapnia: Secondary | ICD-10-CM

## 2018-03-29 NOTE — Assessment & Plan Note (Signed)
4-5 min discussion re active cigarette smoking in addition to office E&M  Ask about tobacco use:  ongoing Advise quitting: I took an extended  opportunity with this patient to outline the consequences of continued cigarette use  in airway disorders based on all the data we have from the multiple national lung health studies (perfomed over decades at millions of dollars in cost)  indicating that smoking cessation, not choice of inhalers or physicians, is the most important aspect of her  care.   Assess willingness not Assist in quit attempt per pcp Arrange follow up. Per pcp

## 2018-03-29 NOTE — Assessment & Plan Note (Signed)
Counseled re importance of smoking cessation but did not meet time criteria for separate billing     I had an extended discussion with the patient reviewing all relevant studies completed to date and  lasting 15 to 20 minutes of a 25 minute visit    Each maintenance medication was reviewed in detail including most importantly the difference between maintenance and prns and under what circumstances the prns are to be triggered using an action plan format that is not reflected in the computer generated alphabetically organized AVS.     Please see AVS for specific instructions unique to this visit that I personally wrote and verbalized to the the pt in detail and then reviewed with pt  by my nurse highlighting any  changes in therapy recommended at today's visit to their plan of care.   

## 2018-03-29 NOTE — Assessment & Plan Note (Signed)
-   See ono 09/29/13 > desat x 54 min < 89% > rec 2lpm  - 07/26/2016 Patient Saturations on Room Air at Rest = 87%---increased to 92% on 2lpm pulsed o2  - HCO3  03/26/2018 = 43 see hypercarbic RF   Though somewhat paradoxic, when the lung fails to clear C02 properly and pC02 rises the lung then becomes a more efficient scavenger of C02 allowing lower work of breathing and  better C02 clearance albeit at a higher serum pC02 level - this is why pts can look a lot better than their ABG's would suggest and why it's so difficult to prognosticate endstage dz.  It's also why I strongly rec DNI status (ventilating pts down to a nl pC02 adversely affects this compensatory mechanism)   Goal is to keep sats in low 90s/ reviewed

## 2018-03-29 NOTE — Assessment & Plan Note (Signed)
Active smoker  - pft's 12/12/12 FEV1  1.41 (53%) ratio 54 with no significant reversibility and DLCO 47% corrects to 50 01/16/2013  Walked RA x 2 laps @ 185 ft each stopped due to sob, no desat     - Trial of anoro 01/16/2013 > helped but insurance would not pay - incruse one daily started 10/02/2013 >>>not covered  -Spiriva restarted 10/15/13 > changed to [facility] 12/30/13 but not consistent with it  10/15/13 Med calendar , 01/27/2014 > lost it at f/u 05/02/14 ,  Did not understand how to use it at f/u ov 07/25/14  05/30/14 refer to pulm rehab  - 07/13/2017  Restarted flutter valve prn  - 08/10/2017  After extensive coaching inhaler device  effectiveness =    90% from baseline 75%   Severe copd but no evidence of aecopd > no change in rx

## 2018-05-03 NOTE — Progress Notes (Signed)
Cardiology Office Note   Date:  05/09/2018   ID:  Patricia Wall, DOB 01-30-1951, MRN [id number]  PCP:  Bernerd Limbo  Cardiologist:   Jenkins Rouge, MD   No chief complaint on file.     History of Present Illness: Patricia Wall is a 69 y.o. female who presents for f/u  regarding CAD. [name] Previous stent to LAD and distal circumflex  And OM2 PCI in 2005.  ISR with balloon Rx 2006.  She had patent stents by cath June 2011 Recurrent SSCP with restenting of circumflex September 2015. Also has PVD with history of 15-05% LICA stenosis that has not been checked since 2016 TTE reviewed from 2016 and EF 65-70% estimated PA 38 mmHg no valve disease   Severe COPD on oxygen. FEV1 1.41 DLCO 47% Needs 4L with activity Still smoking   She is limited due to her breathing. Has had some SSCP and taken 3 nitro over last 6 months No prolonged pains   Past Medical History:  Diagnosis Date  . Abnormal chest xray   . Alcohol abuse   . Back pain   . CAD (coronary artery disease)     Prev seen by [facility].  Stent to OM and LAD Last cath 6/11 cathed on June 13, [name]. 2011.  The cardiac catheterization showed 30% and 50% lesions in the mid  and distal LAD, 50% in the OM1, 40% in the OM2 with 20% in-stent  restenosis, 40% in-stent restenosis in the circumflex and no significant  disease in the RCA.  Her EF was normal.  Dr. Olevia Perches recommended a trial  of proton pump inhibitors   . Chest pain   . COPD (chronic obstructive pulmonary disease) ([place])   . Cyanosis   . DDD (degenerative disc disease)   . Depression   . Diarrhea   . Diverticular disease   . GERD (gastroesophageal reflux disease)   . H/O: hysterectomy   . Hemorrhoids   . HTN (hypertension)   . IBS (irritable bowel syndrome)   . Insomnia   . Lymphadenitis   . Pharyngitis   . PUD (peptic ulcer disease)   . PVD (peripheral vascular disease) ([place])   . Sinusitis   . TIA (transient ischemic attack)   . Tobacco abuse   . Vertigo     Past  Surgical History:  Procedure Laterality Date  . ABDOMINAL HYSTERECTOMY    . APPENDECTOMY    . BREAST LUMPECTOMY Left 2015  . CARDIAC CATHETERIZATION  2005, 2006, 2009,2011  . CHOLECYSTECTOMY    . CORONARY STENT PLACEMENT     Status post balloon angiogram plasty and Cypher stent to the distal    circumflex and OM2 branch  . Hammertoe repair    . Left Bunionectomy    . LEFT HEART CATHETERIZATION WITH CORONARY ANGIOGRAM N/A 12/03/2013   Procedure: LEFT HEART CATHETERIZATION WITH CORONARY ANGIOGRAM;  Surgeon: Leonie Man, MD;  Location: [facility] CATH LAB;  Service: Cardiovascular;  Laterality: N/A;  . TUBAL LIGATION       Current Outpatient Medications  Medication Sig Dispense Refill  . acetaminophen-codeine (TYLENOL #3) 300-30 MG tablet TAKE 1 TABLET EVERY 4 HOURS AS NEEDED FOR COUGH 40 tablet 0  . albuterol (PROVENTIL) (2.5 MG/3ML) 0.083% nebulizer solution USE 1 VIAL IN NEBULIZER EVERY 4 HOURS AS NEEDED FOR WHEEZING OR SHORTNESS OF BREATH. 300 mL 2  . albuterol (VENTOLIN HFA) 108 (90 Base) MCG/ACT inhaler Inhale 2 puffs into the lungs every 6 (six) hours as  needed for wheezing or shortness of breath. 1 Inhaler 2  . aspirin 81 MG tablet Take 81 mg by mouth at bedtime.     [name] atorvastatin (LIPITOR) 80 MG tablet Take 80 mg by mouth daily.    . blood glucose meter kit and supplies Dispense based on patient and insurance preference. Use up to four times daily as directed. (FOR ICD-9 250.00, 250.01). 1 each 0  . budesonide-formoterol (SYMBICORT) 160-4.5 MCG/ACT inhaler Inhale 2 puffs into the lungs 2 (two) times daily. 10.6 g 5  . butalbital-acetaminophen-caffeine (FIORICET, ESGIC) 50-325-40 MG tablet 1 - 1 and 1/2 every 8 hours as needed for headache    . calcium carbonate (OS-CAL) 600 MG TABS Take 600 mg by mouth every morning.     . Cholecalciferol (VITAMIN D) 2000 UNITS tablet Take 2,000 Units by mouth every morning.     . clopidogrel (PLAVIX) 75 MG tablet Take 75 mg by mouth every morning.       [name] Dextromethorphan-Guaifenesin (MUCINEX FAST-MAX DM MAX) 5-100 MG/5ML LIQD 1 tsp every 4 hours as needed for cough/congestion    . Diclofenac Potassium (CAMBIA) 50 MG PACK Take at onset of headache as needed     . donepezil (ARICEPT) 5 MG tablet Take 1 tablet by mouth daily.    . DULoxetine (CYMBALTA) 60 MG capsule Take 60 mg by mouth every morning.     Eduard Roux (AIMOVIG [place]) Inject into the skin as directed.    . gabapentin (NEURONTIN) 300 MG capsule Take 300 mg by mouth 3 (three) times daily.     . Lancets (FREESTYLE) lancets Use as instructed 100 each 12  . letrozole (FEMARA) 2.5 MG tablet Take 2.5 mg by mouth every morning.     . metFORMIN (GLUCOPHAGE) 1000 MG tablet Take 1,000 mg by mouth 2 (two) times daily with a meal.    . methocarbamol (ROBAXIN) 750 MG tablet Take 750 mg by mouth every 8 (eight) hours as needed for muscle spasms.    . Multiple Vitamin (MULTIVITAMIN) tablet Take 1 tablet by mouth every morning.     . nitroGLYCERIN (NITROSTAT) 0.4 MG SL tablet Place 1 tablet (0.4 mg total) under the tongue every 5 (five) minutes as needed for chest pain (may repeat x3). 25 tablet 2  . omeprazole (PRILOSEC) 40 MG capsule Take 40 mg by mouth daily before breakfast.     . ondansetron (ZOFRAN) 8 MG tablet Take 8 mg by mouth every 4 (four) hours as needed for nausea.     . OXYGEN Use 4L 24/7    . pantoprazole (PROTONIX) 40 MG tablet Take 1 tablet by mouth daily.    [name] Respiratory Therapy Supplies (FLUTTER) DEVI Use as directed 1 each 0  . Tiotropium Bromide Monohydrate (SPIRIVA RESPIMAT) 2.5 MCG/ACT AERS Inhale 2 puffs into the lungs daily. 1 Inhaler 5   No current facility-administered medications for this visit.     Allergies:   Patient has no known allergies.    Social History:  The patient  reports that she has been smoking cigarettes. She has a 104.00 pack-year smoking history. She has never used smokeless tobacco. She reports current drug use. She reports that she does not  drink alcohol.   Family History:  The patient's family history includes Asthma in her sister; Cancer - Other in her father and mother; Coronary artery disease in her unknown relative; Emphysema in her father; Heart disease in her father and mother; Rheumatologic disease in her mother; Stroke in her  sister.    ROS:  Please see the history of present illness.   Otherwise, review of systems are positive for none.   All other systems are reviewed and negative.    PHYSICAL EXAM: VS:  BP 118/60   Pulse (!) 112   Ht 5' 6"  (1.676 m)   Wt 77.1 kg   SpO2 (!) 78%   BMI 27.44 kg/m  , BMI Body mass index is 27.44 kg/m. Affect appropriate Chronically ill COPD er  HEENT: normal Neck supple with no adenopathy JVP normal  Left  bruits no thyromegaly Lungs no active wheezing poor air movement  Heart:  S1/S2 no murmur, no rub, gallop or click PMI normal Abdomen: benighn, BS positve, no tenderness, no AAA no bruit.  No HSM or HJR Distal pulses intact with no bruits No edema Neuro non-focal Skin warm and dry No muscular weakness    EKG:  SR low voltage normal 2016   Recent Labs: 03/26/2018: BUN 11; Creatinine, Ser 0.74; Hemoglobin 11.4; Platelets 181.0; Potassium 4.1; Sodium 140    Lipid Panel    Component Value Date/Time   CHOL  08/29/2009 0849    157        ATP III CLASSIFICATION:  <200     mg/dL   Desirable  200-239  mg/dL   Borderline High  >=240    mg/dL   High          TRIG 313 (H) 08/29/2009 0849   HDL 31 (L) 08/29/2009 0849   CHOLHDL 5.1 08/29/2009 0849   VLDL 63 (H) 08/29/2009 0849   LDLCALC  08/29/2009 0849    63        Total Cholesterol/HDL:CHD Risk Coronary Heart Disease Risk Table                     Men   Women  1/2 Average Risk   3.4   3.3  Average Risk       5.0   4.4  2 X Average Risk   9.6   7.1  3 X Average Risk  23.4   11.0        Use the calculated Patient Ratio above and the CHD Risk Table to determine the patient's CHD Risk.        ATP III  CLASSIFICATION (LDL):  <100     mg/dL   Optimal  100-129  mg/dL   Near or Above                    Optimal  130-159  mg/dL   Borderline  160-189  mg/dL   High  >190     mg/dL   Very High      Wt Readings from Last 3 Encounters:  05/09/18 77.1 kg  03/26/18 78.9 kg  02/02/18 79.6 kg      Other studies Reviewed: Additional studies/ records that were reviewed today include: Cardiology notes 2016 Multiple previous caths, TTE 2016 nots from pulmonary .CXR 07/13/17    ASSESSMENT AND PLAN:  1.  CAD: post multiple previous stents / PCI's mostly to circumflex but also LAD last being in 2015  Will   Medical Rx would be limited by her low BP. Daughter and patient understand That her quality of life is limited by her COPD and would like to avoid stress testing or cath if possible  2. PVD:  RICA 60-79% duplex 02/08/18 continue plavix  3. COPD:  F/u Dr Theresia Majors 3 on  oxygen discussed smoking cessation She understands that this is slowly killing her  4. HLD:  Continue statin labs with primary  5. DM:  Discussed low carb diet.  Target hemoglobin A1c is 6.5 or less.  Continue current medications.    Current medicines are reviewed at length with the patient today.  The patient does not have concerns regarding medicines.  The following changes have been made: None   Labs/ tests ordered today include: None   Orders Placed This Encounter  Procedures  . EKG 12-Lead     Disposition:   FU with me in 6 months     Signed, Jenkins Rouge, MD  05/09/2018 10:33 AM    [facility] [address], [place], [place]  [address] Phone: [phone]; Fax: [phone]

## 2018-05-09 ENCOUNTER — Ambulatory Visit (INDEPENDENT_AMBULATORY_CARE_PROVIDER_SITE_OTHER): Payer: Medicare Other | Admitting: Cardiovascular Disease

## 2018-05-09 ENCOUNTER — Encounter: Payer: Self-pay | Admitting: Cardiovascular Disease

## 2018-05-09 VITALS — BP 118/60 | HR 112 | Ht 66.0 in | Wt 170.0 lb

## 2018-05-09 DIAGNOSIS — I6523 Occlusion and stenosis of bilateral carotid arteries: Secondary | ICD-10-CM

## 2018-05-09 DIAGNOSIS — I739 Peripheral vascular disease, unspecified: Secondary | ICD-10-CM

## 2018-05-09 DIAGNOSIS — E785 Hyperlipidemia, unspecified: Secondary | ICD-10-CM

## 2018-05-09 DIAGNOSIS — I2510 Atherosclerotic heart disease of native coronary artery without angina pectoris: Secondary | ICD-10-CM

## 2018-05-09 NOTE — Patient Instructions (Signed)

## 2018-09-24 ENCOUNTER — Encounter: Payer: Self-pay | Admitting: Internal Medicine

## 2018-09-24 ENCOUNTER — Ambulatory Visit (INDEPENDENT_AMBULATORY_CARE_PROVIDER_SITE_OTHER): Payer: Medicare Other | Admitting: Internal Medicine

## 2018-09-24 ENCOUNTER — Other Ambulatory Visit: Payer: Self-pay

## 2018-09-24 DIAGNOSIS — I6523 Occlusion and stenosis of bilateral carotid arteries: Secondary | ICD-10-CM

## 2018-09-24 DIAGNOSIS — J449 Chronic obstructive pulmonary disease, unspecified: Secondary | ICD-10-CM | POA: Diagnosis not present

## 2018-09-24 DIAGNOSIS — J9612 Chronic respiratory failure with hypercapnia: Secondary | ICD-10-CM | POA: Diagnosis not present

## 2018-09-24 DIAGNOSIS — J9611 Chronic respiratory failure with hypoxia: Secondary | ICD-10-CM

## 2018-09-24 NOTE — Patient Instructions (Addendum)
Change the omeprazole to where you take it 30-60 min before your first meal    The key is to stop smoking completely before smoking completely stops you!   Please schedule a follow up visit in 6  months but call sooner if needed

## 2018-09-24 NOTE — Progress Notes (Signed)
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 rec Prilosec Take 30-60 min before first meal of the day  Stop all inhalers except ventolin and use it up to 2 puffs every 4 hours if can't catch your breath Anoro open once and take two dep drags each am as soon as you get up each day      03/02/2017  f/u ov/Patricia Wall re:  COPD II/ still smoking / no med calendar  Chief Complaint  Patient presents with  . Follow-up    She states she is getting over a cold. Pt has some rhinitis. She has been using albuterol inhaler and neb 2 x daily on average.   sleeping ok on 2 pillows and 2lpm  Very congested cough/ doesn't think her flutter is effective but not sure it's working right and didn't bring it to check out with most of the mucus thick beige esp in am's but not disctubing sleep No change doe =  MMRC3 even on 2lpm with sats typically in the low 90's when stops due to sob  rec Prednisone 10 mg take  4 each am x 2 days,   2 each am x 2 days,  1 each am x 2 days and stop  zpak  I refilled your tylenol #3    07/13/2017  f/u ov/Patricia Wall re:  Copd GOLD II/ still smoking/ not using med cal  Chief Complaint  Patient presents with  . Follow-up    Breathing has been worswe for the past wk. Sats when she arrived today were 73%2lpm pulsed o2--increased to 93% when placed on 4lpm cont. flow o2.  She is  sleeping more and feeling dizzy all the time. She has been using her albuterol inhaler 3 x per day and has been out of her neb.   Dyspnea:  Worse x one week, 2lpm 24/8 > up to 4lpm  And sob across the room Cough: thick yellow -  Not using  flutter  Sleep: 2 pillows  SABA use:  Helps some, over using since ran out of neb saba rec Goal is to keep 02 sat above 90% all the time   Plan A = Automatic = symbicort 160 Take 2 puffs first thing in am and then another 2 puffs about 12 hours later Spiriva 2 pffs after symbicort am dose only  Work on maintaining inhaler technique: Only use your albuterol (Ventolin) as a rescue medication Plan C = Crisis - only use your albuterol nebulizer if you first try Plan B The key is to stop smoking completely before smoking completely stops you!  Avoid dairy products and undercooked vegetables and salads and follow up with your GI doctor if diarrhea not better also could try off prilosec and just take pepcid 20  mg twice daily        03/26/2018  f/u ov/Patricia Wall re:  GOLD II copd/ still smoking ? aecopd  Chief Complaint  Patient presents with  . Acute Visit    Increased SOB x 1 wk. She has had occ pressure and tightness in her chest. She is having episodes where she starts shaking and feels like she can not move. She is using her albuterol inhaler about once per day and used neb last about 2 wks ago.   Dyspnea:  Room to room on 4lpm  Cough: nl for her =sporadic / worse p supper / min mucoid  Sleeping: bed flat, two pillows  SABA use: hfa  02: 4lpm 24/7 sats typiclly low 90s rec Please remember to go to the lab department   for your tests - we will call you with the results when they are available.  No change on 02 > goal is to keep sats in low 90's     09/24/2018  f/u ov/Patricia Wall re: GOLD II  Copd / still smoking  Hypercarbic/ hypoxemia  Chief Complaint  Patient presents with  . Follow-up    Breathing has been getting progressively worse since the last visit.  She states her nose runs constantly. She has prod cough with brown sputum in the mornings. She rarely uses her albuterol inhaler. She uses her neb about once per day.    Dyspnea:  Walks with walker at home/ uses w/c to go out - across the room and sits due to back and breathing Cough: min in am / brown in am then turns clear / has flutter not using  Sleeping: flat / 3 pillows SABA use: neb p supper / did not use maint rx on day of ov  02: 4lpm 24/7    No obvious day to day or daytime variability or assoc excess/ purulent sputum or mucus plugs or hemoptysis or cp or chest tightness, subjective wheeze or overt sinus or hb symptoms.   Sleep  without nocturnal  or early am exacerbation  of respiratory  c/o's or need for noct saba. Also denies any obvious fluctuation of symptoms with weather or environmental changes or other aggravating or alleviating factors except as outlined above   No unusual exposure hx or h/o childhood pna/ asthma or knowledge of premature birth.  Current Allergies, Complete Past Medical History, Past Surgical History, Family History, and Social History were reviewed in [employer] record.  ROS  The following are not active complaints unless bolded Hoarseness, sore throat, dysphagia, dental problems, itching, sneezing,  nasal congestion or discharge of excess mucus or purulent secretions, ear ache,   fever, chills, sweats, unintended wt loss or wt gain, classically pleuritic or exertional cp,  orthopnea pnd or arm/hand swelling  or leg swelling, presyncope, palpitations, abdominal pain, anorexia, nausea, vomiting, diarrhea  or change in bowel habits or change in bladder habits, change in stools or change in urine, dysuria, hematuria,  rash, arthralgias, visual complaints, headache, numbness, weakness or ataxia or problems with walking or coordination,  change in mood or  memory.        Current Meds  Medication Sig  . acetaminophen-codeine (TYLENOL #3)  300-30 MG tablet TAKE 1 TABLET EVERY 4 HOURS AS NEEDED FOR COUGH  . albuterol (PROVENTIL) (2.5 MG/3ML) 0.083% nebulizer solution USE 1 VIAL IN NEBULIZER EVERY 4 HOURS AS NEEDED FOR WHEEZING OR SHORTNESS OF BREATH.  [name] albuterol (VENTOLIN HFA) 108 (90 Base) MCG/ACT inhaler Inhale 2 puffs into  the lungs every 6 (six) hours as needed for wheezing or shortness of breath.  [name] aspirin 81 MG tablet Take 81 mg by mouth at bedtime.   [name] atorvastatin (LIPITOR) 80 MG tablet Take 80 mg by mouth daily.  . blood glucose meter kit and supplies Dispense based on patient and insurance preference. Use up to four times daily as directed. (FOR ICD-9 250.00, 250.01).  . budesonide-formoterol (SYMBICORT) 160-4.5 MCG/ACT inhaler Inhale 2 puffs into the lungs 2 (two) times daily.  . butalbital-acetaminophen-caffeine (FIORICET, ESGIC) 50-325-40 MG tablet 1 - 1 and 1/2 every 8 hours as needed for headache  . calcium carbonate (OS-CAL) 600 MG TABS Take 600 mg by mouth every morning.   . Cholecalciferol (VITAMIN D) 2000 UNITS tablet Take 2,000 Units by mouth every morning.   . clopidogrel (PLAVIX) 75 MG tablet Take 75 mg by mouth every morning.   [name] Dextromethorphan-Guaifenesin (MUCINEX FAST-MAX DM MAX) 5-100 MG/5ML LIQD 1 tsp every 4 hours as needed for cough/congestion  . Diclofenac Potassium (CAMBIA) 50 MG PACK Take at onset of headache as needed   . donepezil (ARICEPT) 5 MG tablet Take 1 tablet by mouth daily.  . DULoxetine (CYMBALTA) 60 MG capsule Take 60 mg by mouth every morning.   Eduard Roux (AIMOVIG [place]) Inject into the skin as directed.  . gabapentin (NEURONTIN) 300 MG capsule Take 300 mg by mouth 3 (three) times daily.   . Lancets (FREESTYLE) lancets Use as instructed  . letrozole (FEMARA) 2.5 MG tablet Take 2.5 mg by mouth every morning.   . metFORMIN (GLUCOPHAGE) 1000 MG tablet Take 1,000 mg by mouth 2 (two) times daily with a meal.  . methocarbamol (ROBAXIN) 750 MG tablet Take 750 mg by mouth every 8 (eight)  hours as needed for muscle spasms.  . Multiple Vitamin (MULTIVITAMIN) tablet Take 1 tablet by mouth every morning.   . nitroGLYCERIN (NITROSTAT) 0.4 MG SL tablet Place 1 tablet (0.4 mg total) under the tongue every 5 (five) minutes as needed for chest pain (may repeat x3).  [name] omeprazole (PRILOSEC) 40 MG capsule Take 40 mg by mouth daily before breakfast.   . ondansetron (ZOFRAN) 8 MG tablet Take 8 mg by mouth every 4 (four) hours as needed for nausea.   . OXYGEN Use 4L 24/7  . pantoprazole (PROTONIX) 40 MG tablet Take 1 tablet by mouth daily.  [name] Respiratory Therapy Supplies (FLUTTER) DEVI Use as directed  . Tiotropium Bromide Monohydrate (SPIRIVA RESPIMAT) 2.5 MCG/ACT AERS Inhale 2 puffs into the lungs daily.            Objective:  Physical Exam      09/24/2018   155  04/02/2013  198  >   10/02/2013   207 >  201 10/15/13 > 12/30/2013  207 >209 01/27/2014 > 05/02/2014 215>211 05/30/14 > 07/25/2014   214 > 10/31/2014  221 >213 12/09/14 >205 01/30/2015 > 02/10/2015    202 > 04/02/2015  206 >   10/08/2015   182 > 01/11/2016 177 >  04/28/2016  181 >  07/26/2016   184   > 03/02/2017  186  > 07/13/2017   185 > 03/26/2018   174     Vital signs reviewed - Note on arrival 02 sats  96% on 4lpm pulsed   W/c bound chronically ill wf nad   HEENT: nl  Oropharynx.   Nl external ear canals without cough reflex -  Mild bilateral non-specific turbinate edema     NECK :  without  JVD/Nodes/TM/ nl carotid upstrokes bilaterally   LUNGS: no acc muscle use,  Mod barrel  contour chest wall with bilateral  Distant bs s audible wheeze and  without cough on insp or exp maneuver and mod  Hyperresonant  to  percussion bilaterally     CV:  RRR  no s3 or murmur or increase in P2, and no edema   ABD:  soft and nontender with pos mid insp Hoover's  in the supine position. No bruits or organomegaly appreciated, bowel sounds nl  MS:   Nl gait/  ext warm without deformities, calf tenderness, cyanosis or clubbing No obvious joint  restrictions   SKIN: warm and dry without lesions    NEURO:  alert, approp, nl sensorium with  no motor or cerebellar deficits apparent        Assessment:

## 2018-09-24 NOTE — Assessment & Plan Note (Signed)
Active smoker  - pft's 12/12/12 FEV1  1.41 (53%) ratio 54 with no significant reversibility and DLCO 47% corrects to 50 01/16/2013  Walked RA x 2 laps @ 185 ft each stopped due to sob, no desat     - Trial of anoro 01/16/2013 > helped but insurance would not pay - incruse one daily started 10/02/2013 >>>not covered  -Spiriva restarted 10/15/13 > changed to [facility] 12/30/13 but not consistent with it  10/15/13 Med calendar , 01/27/2014 > lost it at f/u 05/02/14 ,  Did not understand how to use it at f/u ov 07/25/14  05/30/14 refer to pulm rehab  - 07/13/2017  Restarted flutter valve prn  - 08/10/2017  After extensive coaching inhaler device  effectiveness =    90% from baseline 75%   Although previous PFT only showed GOLD II, she has smoked ever since and now c/b hypercarbic/hypoxemia rf with no resp to prednisone and NCB/ dni status confirmed today so nothing else to offer here.  F/u in this setting can be q 6 m, sooner prn

## 2018-09-24 NOTE — Assessment & Plan Note (Signed)
See ono 09/29/13 > desat x 54 min < 89% > rec 2lpm  - 07/26/2016 Patient Saturations on Room Air at Rest = 87%---increased to 92% on 2lpm pulsed o2  - HCO3  03/26/2018 = 43     Though somewhat paradoxic, when the lung fails to clear C02 properly and pC02 rises the lung then becomes a more efficient scavenger of C02 allowing lower work of breathing and  better C02 clearance albeit at a higher serum pC02 level - this is why pts can look a lot better than their ABG's would suggest and why it's so difficult to prognosticate endstage dz.  It's also why I strongly rec DNI status (ventilating pts down to a nl pC02 adversely affects this compensatory mechanism)   Spent most of this visit reviewing above with daughter, confirming they have yellow sheet for DNI/NCB status at home   F/u q 6 m - can have hospice see if clearly deteriorating in meantime   I had an extended discussion with the patient reviewing all relevant studies completed to date and  lasting 15 to 20 minutes of a 25 minute visit    Each maintenance medication was reviewed in detail including most importantly the difference between maintenance and prns and under what circumstances the prns are to be triggered using an action plan format that is not reflected in the computer generated alphabetically organized AVS.     Please see AVS for specific instructions unique to this visit that I personally wrote and verbalized to the the pt in detail and then reviewed with pt  by my nurse highlighting any  changes in therapy recommended at today's visit to their plan of care.

## 2018-11-06 NOTE — Progress Notes (Signed)
Cardiology Office Note   Date:  11/14/2018   ID:  Kenzey, Birkland 1950-07-21, MRN [id number]  PCP:  Bernerd Limbo, MD  Cardiologist:  Jenkins Rouge, MD   Chief Complaint  Patient presents with  . Follow-up    History of Present Illness: Patricia Wall is a 68 y.o. female with a hx of CAD s/p multiple stenting, PVD and COPD on home supplemental O2, 4L.   She had stenting to LAD, distal LCx and OM2 in 2005, in-stent restenosis with balloon in 2006, cath with patent stents in 2011. Had recurrent chest pain and underwent re-stenting to LCx in 2015. PVD hx includes 93-81% LICA stenosis that has not been checked since 2016. Echocardiogram from 2016 and EF 65-70% estimated PA 38 mmHg no valve disease.    She was noted to have had SSCP and needed to take 3 SL NTG over the previous 6 months.  Today she presents with her daughter and continues to have intermittent chest discomfort.  Reports needing to take SLN TG only several times per month.  States no significant change from last office visit.  She is uninterested in pursuing further testing given her most significant comorbid condition currently is her progressive COPD.  She presents in a wheelchair and reports needing to ambulate with a walker at home.  Continues to be on 4 L nasal cannula full-time.  Follows with Dr. Melvyn Novas, last seen 09/2018.  Reports over the last year, she can see a steady decline secondary to her COPD.  Continues to smoke 2 to 3 cigarettes/day.  We discussed cessation.  More interested in maintaining current status from a cardiac perspective.  Denies recent chest pain, palpitations, LE swelling orthopnea symptoms.  Discussed COVID 19 precautions given her significant respiratory illness.  Past Medical History:  Diagnosis Date  . Abnormal chest xray   . Alcohol abuse   . Back pain   . CAD (coronary artery disease)     Prev seen by [facility].  Stent to OM and LAD Last cath 6/11 cathed on June 13, [name]. 2011.  The  cardiac catheterization showed 30% and 50% lesions in the mid  and distal LAD, 50% in the OM1, 40% in the OM2 with 20% in-stent  restenosis, 40% in-stent restenosis in the circumflex and no significant  disease in the RCA.  Her EF was normal.  Dr. Olevia Perches recommended a trial  of proton pump inhibitors   . Chest pain   . COPD (chronic obstructive pulmonary disease) ([place])   . Cyanosis   . DDD (degenerative disc disease)   . Depression   . Diarrhea   . Diverticular disease   . GERD (gastroesophageal reflux disease)   . H/O: hysterectomy   . Hemorrhoids   . HTN (hypertension)   . IBS (irritable bowel syndrome)   . Insomnia   . Lymphadenitis   . Pharyngitis   . PUD (peptic ulcer disease)   . PVD (peripheral vascular disease) ([place])   . Sinusitis   . TIA (transient ischemic attack)   . Tobacco abuse   . Vertigo     Past Surgical History:  Procedure Laterality Date  . ABDOMINAL HYSTERECTOMY    . APPENDECTOMY    . BREAST LUMPECTOMY Left 2015  . CARDIAC CATHETERIZATION  2005, 2006, 2009,2011  . CHOLECYSTECTOMY    . CORONARY STENT PLACEMENT     Status post balloon angiogram plasty and Cypher stent to the distal    circumflex and OM2 branch  .  Hammertoe repair    . Left Bunionectomy    . LEFT HEART CATHETERIZATION WITH CORONARY ANGIOGRAM N/A 12/03/2013   Procedure: LEFT HEART CATHETERIZATION WITH CORONARY ANGIOGRAM;  Surgeon: Leonie Man, MD;  Location: [facility] CATH LAB;  Service: Cardiovascular;  Laterality: N/A;  . TUBAL LIGATION       Current Outpatient Medications  Medication Sig Dispense Refill  . acetaminophen-codeine (TYLENOL #3) 300-30 MG tablet TAKE 1 TABLET EVERY 4 HOURS AS NEEDED FOR COUGH 40 tablet 0  . albuterol (PROVENTIL) (2.5 MG/3ML) 0.083% nebulizer solution USE 1 VIAL IN NEBULIZER EVERY 4 HOURS AS NEEDED FOR WHEEZING OR SHORTNESS OF BREATH. 300 mL 2  . albuterol (VENTOLIN HFA) 108 (90 Base) MCG/ACT inhaler Inhale 2 puffs into the lungs every 6 (six) hours as needed for  wheezing or shortness of breath. 1 Inhaler 2  . aspirin 81 MG tablet Take 81 mg by mouth at bedtime.     [name] atorvastatin (LIPITOR) 80 MG tablet Take 80 mg by mouth daily.    . blood glucose meter kit and supplies Dispense based on patient and insurance preference. Use up to four times daily as directed. (FOR ICD-9 250.00, 250.01). 1 each 0  . budesonide-formoterol (SYMBICORT) 160-4.5 MCG/ACT inhaler Inhale 2 puffs into the lungs 2 (two) times daily. 10.6 g 5  . butalbital-acetaminophen-caffeine (FIORICET, ESGIC) 50-325-40 MG tablet 1 - 1 and 1/2 every 8 hours as needed for headache    . calcium carbonate (OS-CAL) 600 MG TABS Take 600 mg by mouth every morning.     . Cholecalciferol (VITAMIN D) 2000 UNITS tablet Take 2,000 Units by mouth every morning.     . clopidogrel (PLAVIX) 75 MG tablet Take 75 mg by mouth every morning.     [name] Dextromethorphan-Guaifenesin (MUCINEX FAST-MAX DM MAX) 5-100 MG/5ML LIQD 1 tsp every 4 hours as needed for cough/congestion    . Diclofenac Potassium (CAMBIA) 50 MG PACK Take at onset of headache as needed     . donepezil (ARICEPT) 5 MG tablet Take 1 tablet by mouth daily.    . DULoxetine (CYMBALTA) 60 MG capsule Take 60 mg by mouth every morning.     Eduard Roux (AIMOVIG K[place]et) Inject into the skin as directed.    . gabapentin (NEURONTIN) 300 MG capsule Take 300 mg by mouth 3 (three) times daily.     . Lancets (FREESTYLE) lancets Use as instructed 100 each 12  . letrozole (FEMARA) 2.5 MG tablet Take 2.5 mg by mouth every morning.     . metFORMIN (GLUCOPHAGE) 1000 MG tablet Take 1,000 mg by mouth 2 (two) times daily with a meal.    . methocarbamol (ROBAXIN) 750 MG tablet Take 750 mg by mouth every 8 (eight) hours as needed for muscle spasms.    . Multiple Vitamin (MULTIVITAMIN) tablet Take 1 tablet by mouth every morning.     . nitroGLYCERIN (NITROSTAT) 0.4 MG SL tablet Place 1 tablet (0.4 mg total) under the tongue every 5 (five) minutes as needed for chest pain (may  repeat x3). 25 tablet 2  . omeprazole (PRILOSEC) 40 MG capsule Take 40 mg by mouth daily before breakfast.     . ondansetron (ZOFRAN) 8 MG tablet Take 8 mg by mouth every 4 (four) hours as needed for nausea.     . OXYGEN Use 4L 24/7    . Respiratory Therapy Supplies (FLUTTER) DEVI Use as directed 1 each 0  . Tiotropium Bromide Monohydrate (SPIRIVA RESPIMAT) 2.5 MCG/ACT AERS Inhale 2 puffs  into the lungs daily. 1 Inhaler 5   No current facility-administered medications for this visit.     Allergies:   Patient has no known allergies.    Social History:  The patient  reports that she has been smoking cigarettes. She has a 104.00 pack-year smoking history. She has never used smokeless tobacco. She reports current drug use. She reports that she does not drink alcohol.   Family History:  The patient's family history includes Asthma in her sister; Cancer - Other in her father and mother; Coronary artery disease in an other family member; Emphysema in her father; Heart disease in her father and mother; Rheumatologic disease in her mother; Stroke in her sister.    ROS:  Please see the history of present illness.  Otherwise, review of systems are positive for none.   All other systems are reviewed and negative.    PHYSICAL EXAM: VS:  BP 110/64   Pulse 84   Ht 5' 6"  (1.676 m)   Wt 150 lb 6.4 oz (68.2 kg)   BMI 24.28 kg/m  , BMI Body mass index is 24.28 kg/m.   General: Ill-appearing, NAD Lungs: Diminished bilaterally. No wheezes, rales, or rhonchi. Breathing is mildly labored.  Supplemental O2 in place. Cardiovascular: RRR with S1 S2. No murmur.  Extremities: No edema. No clubbing or cyanosis. DP pulses 1+ bilaterally Neuro: Alert and oriented. No focal deficits. No facial asymmetry. MAE spontaneously. Psych: Responds to questions appropriately with normal affect.     EKG:  EKG is not ordered today.    Recent Labs: 03/26/2018: BUN 11; Creatinine, Ser 0.74; Hemoglobin 11.4; Platelets  181.0; Potassium 4.1; Sodium 140    Lipid Panel    Component Value Date/Time   CHOL  08/29/2009 0849    157        ATP III CLASSIFICATION:  <200     mg/dL   Desirable  200-239  mg/dL   Borderline High  >=240    mg/dL   High          TRIG 313 (H) 08/29/2009 0849   HDL 31 (L) 08/29/2009 0849   CHOLHDL 5.1 08/29/2009 0849   VLDL 63 (H) 08/29/2009 0849   LDLCALC  08/29/2009 0849    63        Total Cholesterol/HDL:CHD Risk Coronary Heart Disease Risk Table                     Men   Women  1/2 Average Risk   3.4   3.3  Average Risk       5.0   4.4  2 X Average Risk   9.6   7.1  3 X Average Risk  23.4   11.0        Use the calculated Patient Ratio above and the CHD Risk Table to determine the patient's CHD Risk.        ATP III CLASSIFICATION (LDL):  <100     mg/dL   Optimal  100-129  mg/dL   Near or Above                    Optimal  130-159  mg/dL   Borderline  160-189  mg/dL   High  >190     mg/dL   Very High    Wt Readings from Last 3 Encounters:  11/14/18 150 lb 6.4 oz (68.2 kg)  09/24/18 155 lb 9.6 oz (70.6 kg)  05/09/18 170 lb (77.1 kg)  Other studies Reviewed: Additional studies/ records that were reviewed today include:   Echocardiogram 01/09/2015: Study Conclusions  - Left ventricle: The cavity size was normal. There was mild focal   basal hypertrophy of the septum. Systolic function was vigorous.   The estimated ejection fraction was in the range of 65% to 70%.   Wall motion was normal; there were no regional wall motion   abnormalities. Doppler parameters are consistent with abnormal   left ventricular relaxation (grade 1 diastolic dysfunction). - Left atrium: The atrium was mildly dilated. - Right ventricle: The cavity size was mildly dilated. Wall   thickness was normal. - Right atrium: The atrium was mildly dilated. - Pulmonary arteries: Systolic pressure was mildly increased. PA   peak pressure: 38 mm Hg (S).  ASSESSMENT AND PLAN:  1. CAD:  -s/p multiple remote PCIs in the past -Intermittent SLN TG but no significant change since last office visit -Not interested in pursuing aggressive intervention secondary to progressive COPD -Continue ASA, atorvastatin, Plavix   2. PVD: -RICA with 60-79% per duplex 02/08/2018 -Continue Plavix   3. COPD: -Follows with Dr. Melvyn Novas -On home O2>> stable but appears to be declining per patient report -Stable on current regimen   4. HLD: -Continue statin -Lipid panel/LFTs per PCP>> requested they send copy to our office  5. DM2: -HbA1c, 7.7>>>12/2014 -Per PCP  6. Tobacco abuse: -Continues to smoke 2 to 3 cigarettes/day -Smoking cessation strongly encouraged  -Discussed dangers of smoking with supplemental oxygen   Current medicines are reviewed at length with the patient today.  The patient does not have concerns regarding medicines.  The following changes have been made:  no change  Labs/ tests ordered today include: None  No orders of the defined types were placed in this encounter.    Disposition:   FU with Dr. Johnsie Cancel in 6 months    Signed, Patricia Drown, NP  11/14/2018 1:55 PM    [facility] [place], [place], [place]  [address] Phone: [phone]; Fax: [phone]

## 2018-11-14 ENCOUNTER — Ambulatory Visit (INDEPENDENT_AMBULATORY_CARE_PROVIDER_SITE_OTHER): Payer: Medicare Other | Admitting: Cardiology

## 2018-11-14 ENCOUNTER — Encounter: Payer: Self-pay | Admitting: Cardiology

## 2018-11-14 ENCOUNTER — Other Ambulatory Visit: Payer: Self-pay

## 2018-11-14 VITALS — BP 110/64 | HR 84 | Ht 66.0 in | Wt 150.4 lb

## 2018-11-14 DIAGNOSIS — I10 Essential (primary) hypertension: Secondary | ICD-10-CM

## 2018-11-14 DIAGNOSIS — I2510 Atherosclerotic heart disease of native coronary artery without angina pectoris: Secondary | ICD-10-CM | POA: Diagnosis not present

## 2018-11-14 DIAGNOSIS — E785 Hyperlipidemia, unspecified: Secondary | ICD-10-CM | POA: Diagnosis not present

## 2018-11-14 DIAGNOSIS — I739 Peripheral vascular disease, unspecified: Secondary | ICD-10-CM | POA: Diagnosis not present

## 2018-11-14 NOTE — Patient Instructions (Signed)
Medication Instructions:  Your physician recommends that you continue on your current medications as directed. Please refer to the Current Medication list given to you today.  If you need a refill on your cardiac medications before your next appointment, please call your pharmacy.   Lab work: None ordered  If you have labs (blood work) drawn today and your tests are completely normal, you will receive your results only by: . MyChart Message (if you have MyChart) OR . A paper copy in the mail If you have any lab test that is abnormal or we need to change your treatment, we will call you to review the results.  Testing/Procedures: None ordered  Follow-Up: At [facility], you and your health needs are our priority.  As part of our continuing mission to provide you with exceptional heart care, we have created designated Provider Care Teams.  These Care Teams include your primary Cardiologist (physician) and Advanced Practice Providers (APPs -  Physician Assistants and Nurse Practitioners) who all work together to provide you with the care you need, when you need it. You will need a follow up appointment in 6 months.  Please call our office 2 months in advance to schedule this appointment.  You may see Peter Nishan, MD or one of the following Advanced Practice Providers on your designated Care Team:   Lori Gerhardt, NP Laura Ingold, NP . Jill McDaniel, NP  Any Other Special Instructions Will Be Listed Below (If Applicable).    

## 2019-01-08 ENCOUNTER — Ambulatory Visit: Payer: Medicare Other | Admitting: Internal Medicine

## 2019-01-11 ENCOUNTER — Other Ambulatory Visit: Payer: Self-pay

## 2019-01-11 ENCOUNTER — Encounter: Payer: Self-pay | Admitting: Internal Medicine

## 2019-01-11 ENCOUNTER — Ambulatory Visit (INDEPENDENT_AMBULATORY_CARE_PROVIDER_SITE_OTHER): Payer: Medicare Other | Admitting: Internal Medicine

## 2019-01-11 DIAGNOSIS — J449 Chronic obstructive pulmonary disease, unspecified: Secondary | ICD-10-CM

## 2019-01-11 DIAGNOSIS — I6523 Occlusion and stenosis of bilateral carotid arteries: Secondary | ICD-10-CM | POA: Diagnosis not present

## 2019-01-11 DIAGNOSIS — F17210 Nicotine dependence, cigarettes, uncomplicated: Secondary | ICD-10-CM | POA: Diagnosis not present

## 2019-01-11 DIAGNOSIS — J9612 Chronic respiratory failure with hypercapnia: Secondary | ICD-10-CM | POA: Diagnosis not present

## 2019-01-11 DIAGNOSIS — J9611 Chronic respiratory failure with hypoxia: Secondary | ICD-10-CM | POA: Diagnosis not present

## 2019-01-11 MED ORDER — PREDNISONE 10 MG PO TABS: ORAL_TABLET | ORAL | 11 refills | Status: DC

## 2019-01-11 NOTE — Progress Notes (Addendum)
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 rec Prilosec Take 30-60 min before first meal of the day  Stop all inhalers except ventolin and use it up to 2 puffs every 4 hours if can't catch your breath Anoro open once and take two dep drags each am as soon as you get up each day      03/02/2017  f/u ov/ re:  COPD II/ still smoking / no med calendar  Chief Complaint  Patient presents with  . Follow-up    She states she is getting over a cold. Pt has some rhinitis. She has been using albuterol inhaler and neb 2 x daily on average.   sleeping ok on 2 pillows and 2lpm  Very congested cough/ doesn't think her flutter is effective but not sure it's working right and didn't bring it to check out with most of the mucus thick beige esp in am's but not disctubing sleep No change doe =  MMRC3 even on 2lpm with sats typically in the low 90's when stops due to sob  rec Prednisone 10 mg take  4 each am x 2 days,   2 each am x 2 days,  1 each am x 2 days and stop  zpak  I refilled your tylenol #3    07/13/2017  f/u ov/ re:  Copd GOLD II/ still smoking/ not using med cal  Chief Complaint  Patient presents with  . Follow-up    Breathing has been worswe for the past wk. Sats when she arrived today were 73%2lpm pulsed o2--increased to 93% when placed on 4lpm cont. flow o2.  She is  sleeping more and feeling dizzy all the time. She has been using her albuterol inhaler 3 x per day and has been out of her neb.   Dyspnea:  Worse x one week, 2lpm 24/8 > up to 4lpm  And sob across the room Cough: thick yellow -  Not using  flutter  Sleep: 2 pillows  SABA use:  Helps some, over using since ran out of neb saba rec Goal is to keep 02 sat above 90% all the time   Plan A = Automatic = symbicort 160 Take 2 puffs first thing in am and then another 2 puffs about 12 hours later Spiriva 2 pffs after symbicort am dose only  Work on maintaining inhaler technique: Only use your albuterol (Ventolin) as a rescue medication Plan C = Crisis - only use your albuterol nebulizer if you first try Plan B The key is to stop smoking completely before smoking completely stops you!  Avoid dairy products and undercooked vegetables and salads and follow up with your GI doctor if diarrhea not better also could try off prilosec and just take pepcid 20  mg twice daily        03/26/2018  f/u ov/ re:  GOLD II copd/ still smoking ? aecopd  Chief Complaint  Patient presents with  . Acute Visit    Increased SOB x 1 wk. She has had occ pressure and tightness in her chest. She is having episodes where she starts shaking and feels like she can not move. She is using her albuterol inhaler about once per day and used neb last about 2 wks ago.   Dyspnea:  Room to room on 4lpm  Cough: nl for her =sporadic / worse p supper / min mucoid  Sleeping: bed flat, two pillows  SABA use: hfa  02: 4lpm 24/7 sats typiclly low 90s rec Please remember to go to the lab department   for your tests - we will call you with the results when they are available.  No change on 02 > goal is to keep sats in low 90's     09/24/2018  f/u ov/ re: GOLD II  Copd / still smoking  Hypercarbic/ hypoxemia  Chief Complaint  Patient presents with  . Follow-up    Breathing has been getting progressively worse since the last visit.  She states her nose runs constantly. She has prod cough with brown sputum in the mornings. She rarely uses her albuterol inhaler. She uses her neb about once per day.    Dyspnea:  Walks with walker at home/ uses w/c to go out - across the room and sits due to back and breathing Cough: min in am / brown in am then turns clear / has flutter not using  Sleeping: flat / 3 pillows SABA use: neb p supper / did not use maint rx on day of ov  02: 4lpm 24/7  rec Change the omeprazole to where you take it 30-60 min before your first meal  The key is to stop smoking completely before smoking completely stops you!     01/11/2019  f/u ov/ re: copd II spiriometry but 02 dep/ rx symb /spiriva Chief Complaint  Patient presents with  . Follow-up    Breathing getting worse and still c/o runny nose. She is using her albuterol inhaler 3 x daily on average.   Dyspnea:  Walks with walker/ w/c to leave home/ no recent change   Cough: no Sleeping: bed is flat and 3 pillows SABA use: as above, not sure it's helps  02: 4lpm 24/7    No obvious day to day or daytime variability or assoc excess/ purulent sputum or mucus plugs or hemoptysis or cp or chest tightness, subjective wheeze or overt sinus or hb symptoms.   Sleeping as above  without nocturnal  or early am exacerbation  of respiratory  c/o's or need for noct saba. Also denies any obvious fluctuation of symptoms with weather or environmental changes or other aggravating or alleviating factors except as outlined above   No unusual exposure hx or h/o childhood pna/ asthma or knowledge of premature birth.  Current Allergies, Complete Past Medical History, Past Surgical History, Family History, and Social History were reviewed in [employer] record.  ROS  The following are not active complaints unless bolded Hoarseness, sore throat, dysphagia, dental problems, itching, sneezing,  nasal congestion or discharge of excess mucus or  purulent secretions, ear ache,   fever, chills, sweats, unintended wt loss or wt gain, classically pleuritic or exertional cp,  orthopnea pnd or arm/hand swelling  or leg swelling, presyncope, palpitations, abdominal  pain, anorexia, nausea, vomiting, diarrhea  or change in bowel habits or change in bladder habits, change in stools or change in urine, dysuria, hematuria,  rash, arthralgias, visual complaints, headache, numbness, weakness or ataxia or problems with walking or coordination,  change in mood or  memory.        Current Meds  Medication Sig  . acetaminophen-codeine (TYLENOL #3) 300-30 MG tablet TAKE 1 TABLET EVERY 4 HOURS AS NEEDED FOR COUGH  . albuterol (PROVENTIL) (2.5 MG/3ML) 0.083% nebulizer solution USE 1 VIAL IN NEBULIZER EVERY 4 HOURS AS NEEDED FOR WHEEZING OR SHORTNESS OF BREATH.  [name] albuterol (VENTOLIN HFA) 108 (90 Base) MCG/ACT inhaler Inhale 2 puffs into the lungs every 6 (six) hours as needed for wheezing or shortness of breath.  [name] aspirin 81 MG tablet Take 81 mg by mouth at bedtime.   [name] atorvastatin (LIPITOR) 80 MG tablet Take 80 mg by mouth daily.  . blood glucose meter kit and supplies Dispense based on patient and insurance preference. Use up to four times daily as directed. (FOR ICD-9 250.00, 250.01).  . budesonide-formoterol (SYMBICORT) 160-4.5 MCG/ACT inhaler Inhale 2 puffs into the lungs 2 (two) times daily.  . butalbital-acetaminophen-caffeine (FIORICET, ESGIC) 50-325-40 MG tablet 1 - 1 and 1/2 every 8 hours as needed for headache  . calcium carbonate (OS-CAL) 600 MG TABS Take 600 mg by mouth every morning.   . Cholecalciferol (VITAMIN D) 2000 UNITS tablet Take 2,000 Units by mouth every morning.   . clopidogrel (PLAVIX) 75 MG tablet Take 75 mg by mouth every morning.   [name] Dextromethorphan-Guaifenesin (MUCINEX FAST-MAX DM MAX) 5-100 MG/5ML LIQD 1 tsp every 4 hours as needed for cough/congestion  . Diclofenac Potassium (CAMBIA) 50 MG PACK Take at onset of headache as  needed   . donepezil (ARICEPT) 5 MG tablet Take 1 tablet by mouth daily.  . DULoxetine (CYMBALTA) 60 MG capsule Take 60 mg by mouth every morning.   Eduard Roux (AIMOVIG [place]) Inject into the skin as directed.  . gabapentin (NEURONTIN) 300 MG capsule Take 300 mg by mouth 3 (three) times daily.   . Lancets (FREESTYLE) lancets Use as instructed  . letrozole (FEMARA) 2.5 MG tablet Take 2.5 mg by mouth every morning.   . metFORMIN (GLUCOPHAGE) 1000 MG tablet Take 1,000 mg by mouth 2 (two) times daily with a meal.  . methocarbamol (ROBAXIN) 750 MG tablet Take 750 mg by mouth every 8 (eight) hours as needed for muscle spasms.  . Multiple Vitamin (MULTIVITAMIN) tablet Take 1 tablet by mouth every morning.   . nitroGLYCERIN (NITROSTAT) 0.4 MG SL tablet Place 1 tablet (0.4 mg total) under the tongue every 5 (five) minutes as needed for chest pain (may repeat x3).  [name] omeprazole (PRILOSEC) 40 MG capsule Take 40 mg by mouth daily before breakfast.   . ondansetron (ZOFRAN) 8 MG tablet Take 8 mg by mouth every 4 (four) hours as needed for nausea.   . OXYGEN Use 4L 24/7  . Respiratory Therapy Supplies (FLUTTER) DEVI Use as directed  . Tiotropium Bromide Monohydrate (SPIRIVA RESPIMAT) 2.5 MCG/ACT AERS Inhale 2 puffs into the lungs daily.               Objective:  Physical Exam     01/11/2019   147  09/24/2018   155  04/02/2013  198  >   10/02/2013   207 >  201 10/15/13 > 12/30/2013  207 >209 01/27/2014 > 05/02/2014 215>211 05/30/14 > 07/25/2014   214 >  10/31/2014  221 >213 12/09/14 >205 01/30/2015 > 02/10/2015    202 > 04/02/2015  206 >   10/08/2015   182 > 01/11/2016 177 >  04/28/2016  181 >  07/26/2016   184   > 03/02/2017  186  > 07/13/2017   185 > 03/26/2018   174     Vital signs reviewed - Note on arrival 02 sats  96% on 4lpm pulsed 02 and 70% on RA   W/c bound chronically ill wf nad  HEENT : pt wearing mask not removed for exam due to covid -19 concerns.    NECK :  without JVD/Nodes/TM/ nl carotid upstrokes  bilaterally   LUNGS: no acc muscle use,  Mod barrel  contour chest wall with bilateral  Insp/exp rhonchi    without cough on insp or exp maneuvers and mod  Hyperresonant  to  percussion bilaterally     CV:  RRR  no s3 or murmur or increase in P2, and no edema   ABD:  soft and nontender with pos mid insp Hoover's  in the supine position. No bruits or organomegaly appreciated, bowel sounds nl  MS:     ext warm without deformities, calf tenderness, cyanosis or clubbing No obvious joint restrictions   SKIN: warm and dry without lesions    NEURO:  alert, approp, nl sensorium with  no motor or cerebellar deficits apparent.              Assessment:

## 2019-01-11 NOTE — Patient Instructions (Signed)
No change in medications  If worse > Prednisone 10 mg take  4 each am x 2 days,   2 each am x 2 days,  1 each am x 2 days and stop    Please schedule a follow up visit in 6  months but call sooner if needed

## 2019-01-13 ENCOUNTER — Encounter: Payer: Self-pay | Admitting: Internal Medicine

## 2019-01-13 NOTE — Assessment & Plan Note (Signed)
Counseled re importance of smoking cessation but did not meet time criteria for separate billing    Advised re dangers of 02 and smoking   I had an extended discussion with the patient/ daughter  reviewing all relevant studies completed to date and  lasting 15 to 20 minutes of a 25 minute visit    Each maintenance medication was reviewed in detail including most importantly the difference between maintenance and prns and under what circumstances the prns are to be triggered using an action plan format that is not reflected in the computer generated alphabetically organized AVS.     Please see AVS for specific instructions unique to this visit that I personally wrote and verbalized to the the pt in detail and then reviewed with pt  by my nurse highlighting any  changes in therapy recommended at today's visit to their plan of care.

## 2019-01-13 NOTE — Assessment & Plan Note (Addendum)
Active smoker  - pft's 12/12/12 FEV1  1.41 (53%) ratio 54 with no significant reversibility and DLCO 47% corrects to 50 01/16/2013  Walked RA x 2 laps @ 185 ft each stopped due to sob, no desat     - Trial of anoro 01/16/2013 > helped but insurance would not pay - incruse one daily started 10/02/2013 >>>not covered  -Spiriva restarted 10/15/13 > changed to [facility] 12/30/13 but not consistent with it  10/15/13 Med calendar , 01/27/2014 > lost it at f/u 05/02/14 ,  Did not understand how to use it at f/u ov 07/25/14  05/30/14 refer to pulm rehab  - 07/13/2017  Restarted flutter valve prn  - 08/10/2017  After extensive coaching inhaler device  effectiveness =    90% from baseline 75%   She is so sedentary that she's actually well compensated in terms of doe and not having exac on triple rx c/w  Group D in terms of symptom/risk and laba/lama/ICS  therefore appropriate rx at this point    >>> can try prednisone to see if helps cough  But doubt will be helpful as not acutely changed from chronic poor baseline.

## 2019-01-29 ENCOUNTER — Telehealth: Payer: Self-pay | Admitting: Internal Medicine

## 2019-01-29 NOTE — Telephone Encounter (Signed)
MW OV notes will need to be updated. Stating the reason for the prednisone taper, was it given for a chronic or acute state. If acute pt will have to be re-tested in office.  Ex: Prednisone taper given for chronic COPD management. Ambulatory walk performed while patient is in stable condition to determine amount of oxygen needed.   MW please advise. Thanks.

## 2019-01-30 NOTE — Telephone Encounter (Signed)
Called [employer] to speak with Santiago Glad but she was not avail. I stated to the receptionist with [employer] that I spoke to that MW did the addendum on his note and they should be able to see this now. Stated if they weren't able to see this to call us back and we could refax info to them. Nothing further needed.

## 2019-01-30 NOTE — Telephone Encounter (Signed)
done

## 2019-01-30 NOTE — Assessment & Plan Note (Addendum)
See ono 09/29/13 > desat x 54 min < 89% > rec 2lpm  - 07/26/2016 Patient Saturations on Room Air at Rest = 87%---increased to 92% on 2lpm pulsed o2  - HCO3  03/26/2018 = 43   - 01/11/2019  02 sats  96% on 4lpm pulsed 02 and 70% on RA > continue 4lpm 24/7   Remains well compensated full ncb/ recertified for 02 today in her chronic severe but stable state, not acutely ill.  She is using 02 approp and benefiting from it.

## 2019-02-11 ENCOUNTER — Ambulatory Visit (HOSPITAL_COMMUNITY)
Admission: RE | Admit: 2019-02-11 | Payer: Medicare Other | Source: Ambulatory Visit | Attending: Cardiovascular Disease | Admitting: Cardiovascular Disease

## 2019-03-13 ENCOUNTER — Telehealth: Payer: Self-pay | Admitting: Internal Medicine

## 2019-03-13 NOTE — Telephone Encounter (Signed)
LVM to advise the patient she can clean the cannula with warm soapy water, rinse with clean clear water and then hang to dry. Stated in the message if she is not able to clean it properly and it needs to be replaced to contact the DME who provides her O2 supplies.  Advised to call back if there are any other questions.

## 2019-03-27 ENCOUNTER — Inpatient Hospital Stay: Admission: AD | Admit: 2019-03-27 | Payer: Medicare Other | Admitting: Internal Medicine

## 2019-03-27 DIAGNOSIS — J449 Chronic obstructive pulmonary disease, unspecified: Secondary | ICD-10-CM

## 2019-03-27 DIAGNOSIS — D649 Anemia, unspecified: Secondary | ICD-10-CM | POA: Diagnosis not present

## 2019-03-27 DIAGNOSIS — I361 Nonrheumatic tricuspid (valve) insufficiency: Secondary | ICD-10-CM | POA: Diagnosis not present

## 2019-03-27 DIAGNOSIS — R778 Other specified abnormalities of plasma proteins: Secondary | ICD-10-CM | POA: Diagnosis not present

## 2019-03-27 DIAGNOSIS — E785 Hyperlipidemia, unspecified: Secondary | ICD-10-CM

## 2019-03-27 DIAGNOSIS — I2510 Atherosclerotic heart disease of native coronary artery without angina pectoris: Secondary | ICD-10-CM | POA: Diagnosis not present

## 2019-03-27 DIAGNOSIS — I480 Paroxysmal atrial fibrillation: Secondary | ICD-10-CM | POA: Diagnosis not present

## 2019-03-28 ENCOUNTER — Inpatient Hospital Stay
Admission: AD | Admit: 2019-03-28 | Payer: Medicare Other | Source: Other Acute Inpatient Hospital | Admitting: Internal Medicine

## 2019-03-28 ENCOUNTER — Ambulatory Visit: Payer: Medicare Other | Admitting: Internal Medicine

## 2019-03-28 DIAGNOSIS — I480 Paroxysmal atrial fibrillation: Secondary | ICD-10-CM | POA: Diagnosis not present

## 2019-03-28 DIAGNOSIS — D649 Anemia, unspecified: Secondary | ICD-10-CM | POA: Diagnosis not present

## 2019-03-28 DIAGNOSIS — R778 Other specified abnormalities of plasma proteins: Secondary | ICD-10-CM | POA: Diagnosis not present

## 2019-03-28 DIAGNOSIS — I2510 Atherosclerotic heart disease of native coronary artery without angina pectoris: Secondary | ICD-10-CM | POA: Diagnosis not present

## 2019-03-29 DIAGNOSIS — J449 Chronic obstructive pulmonary disease, unspecified: Secondary | ICD-10-CM

## 2019-03-29 DIAGNOSIS — I480 Paroxysmal atrial fibrillation: Secondary | ICD-10-CM | POA: Diagnosis not present

## 2019-03-29 DIAGNOSIS — R778 Other specified abnormalities of plasma proteins: Secondary | ICD-10-CM | POA: Diagnosis not present

## 2019-03-29 DIAGNOSIS — I2510 Atherosclerotic heart disease of native coronary artery without angina pectoris: Secondary | ICD-10-CM | POA: Diagnosis not present

## 2019-03-29 DIAGNOSIS — J22 Unspecified acute lower respiratory infection: Secondary | ICD-10-CM

## 2019-03-29 DIAGNOSIS — D649 Anemia, unspecified: Secondary | ICD-10-CM | POA: Diagnosis not present

## 2019-03-29 DIAGNOSIS — E785 Hyperlipidemia, unspecified: Secondary | ICD-10-CM

## 2019-03-29 DIAGNOSIS — N179 Acute kidney failure, unspecified: Secondary | ICD-10-CM | POA: Diagnosis not present

## 2019-03-30 DIAGNOSIS — R778 Other specified abnormalities of plasma proteins: Secondary | ICD-10-CM | POA: Diagnosis not present

## 2019-03-30 DIAGNOSIS — N179 Acute kidney failure, unspecified: Secondary | ICD-10-CM | POA: Diagnosis not present

## 2019-03-30 DIAGNOSIS — E785 Hyperlipidemia, unspecified: Secondary | ICD-10-CM

## 2019-03-30 DIAGNOSIS — D649 Anemia, unspecified: Secondary | ICD-10-CM | POA: Diagnosis not present

## 2019-03-30 DIAGNOSIS — I2510 Atherosclerotic heart disease of native coronary artery without angina pectoris: Secondary | ICD-10-CM

## 2019-03-30 DIAGNOSIS — J9621 Acute and chronic respiratory failure with hypoxia: Secondary | ICD-10-CM | POA: Diagnosis not present

## 2019-03-30 DIAGNOSIS — I480 Paroxysmal atrial fibrillation: Secondary | ICD-10-CM | POA: Diagnosis not present

## 2019-03-31 DIAGNOSIS — N179 Acute kidney failure, unspecified: Secondary | ICD-10-CM | POA: Diagnosis not present

## 2019-03-31 DIAGNOSIS — R778 Other specified abnormalities of plasma proteins: Secondary | ICD-10-CM | POA: Diagnosis not present

## 2019-03-31 DIAGNOSIS — D649 Anemia, unspecified: Secondary | ICD-10-CM | POA: Diagnosis not present

## 2019-03-31 DIAGNOSIS — J9621 Acute and chronic respiratory failure with hypoxia: Secondary | ICD-10-CM | POA: Diagnosis not present

## 2019-03-31 DIAGNOSIS — I480 Paroxysmal atrial fibrillation: Secondary | ICD-10-CM | POA: Diagnosis not present

## 2019-04-01 DIAGNOSIS — D649 Anemia, unspecified: Secondary | ICD-10-CM | POA: Diagnosis not present

## 2019-04-01 DIAGNOSIS — J9621 Acute and chronic respiratory failure with hypoxia: Secondary | ICD-10-CM | POA: Diagnosis not present

## 2019-04-01 DIAGNOSIS — N179 Acute kidney failure, unspecified: Secondary | ICD-10-CM | POA: Diagnosis not present

## 2019-04-01 DIAGNOSIS — I5030 Unspecified diastolic (congestive) heart failure: Secondary | ICD-10-CM

## 2019-04-01 DIAGNOSIS — I480 Paroxysmal atrial fibrillation: Secondary | ICD-10-CM | POA: Diagnosis not present

## 2019-04-01 DIAGNOSIS — R778 Other specified abnormalities of plasma proteins: Secondary | ICD-10-CM | POA: Diagnosis not present

## 2019-04-02 DIAGNOSIS — D649 Anemia, unspecified: Secondary | ICD-10-CM | POA: Diagnosis not present

## 2019-04-02 DIAGNOSIS — N179 Acute kidney failure, unspecified: Secondary | ICD-10-CM | POA: Diagnosis not present

## 2019-04-02 DIAGNOSIS — R778 Other specified abnormalities of plasma proteins: Secondary | ICD-10-CM | POA: Diagnosis not present

## 2019-04-02 DIAGNOSIS — J9621 Acute and chronic respiratory failure with hypoxia: Secondary | ICD-10-CM | POA: Diagnosis not present

## 2019-04-02 DIAGNOSIS — I480 Paroxysmal atrial fibrillation: Secondary | ICD-10-CM | POA: Diagnosis not present

## 2019-04-03 DIAGNOSIS — D649 Anemia, unspecified: Secondary | ICD-10-CM | POA: Diagnosis not present

## 2019-04-03 DIAGNOSIS — R778 Other specified abnormalities of plasma proteins: Secondary | ICD-10-CM | POA: Diagnosis not present

## 2019-04-03 DIAGNOSIS — I480 Paroxysmal atrial fibrillation: Secondary | ICD-10-CM | POA: Diagnosis not present

## 2019-04-03 DIAGNOSIS — N179 Acute kidney failure, unspecified: Secondary | ICD-10-CM | POA: Diagnosis not present

## 2019-04-03 DIAGNOSIS — J9621 Acute and chronic respiratory failure with hypoxia: Secondary | ICD-10-CM | POA: Diagnosis not present

## 2019-04-04 DIAGNOSIS — J9621 Acute and chronic respiratory failure with hypoxia: Secondary | ICD-10-CM | POA: Diagnosis not present

## 2019-04-04 DIAGNOSIS — I10 Essential (primary) hypertension: Secondary | ICD-10-CM

## 2019-04-04 DIAGNOSIS — D649 Anemia, unspecified: Secondary | ICD-10-CM | POA: Diagnosis not present

## 2019-04-04 DIAGNOSIS — I480 Paroxysmal atrial fibrillation: Secondary | ICD-10-CM | POA: Diagnosis not present

## 2019-04-04 DIAGNOSIS — R778 Other specified abnormalities of plasma proteins: Secondary | ICD-10-CM | POA: Diagnosis not present

## 2019-04-04 DIAGNOSIS — N179 Acute kidney failure, unspecified: Secondary | ICD-10-CM | POA: Diagnosis not present

## 2019-04-05 DIAGNOSIS — J9621 Acute and chronic respiratory failure with hypoxia: Secondary | ICD-10-CM | POA: Diagnosis not present

## 2019-04-05 DIAGNOSIS — N179 Acute kidney failure, unspecified: Secondary | ICD-10-CM | POA: Diagnosis not present

## 2019-04-05 DIAGNOSIS — I480 Paroxysmal atrial fibrillation: Secondary | ICD-10-CM | POA: Diagnosis not present

## 2019-04-05 DIAGNOSIS — R778 Other specified abnormalities of plasma proteins: Secondary | ICD-10-CM | POA: Diagnosis not present

## 2019-04-05 DIAGNOSIS — D649 Anemia, unspecified: Secondary | ICD-10-CM | POA: Diagnosis not present

## 2019-04-11 NOTE — Progress Notes (Addendum)
CARDIOLOGY OFFICE NOTE  Date:  04/15/2019    Patricia Wall Date of Birth: [birth year] Medical Record #[id number]  PCP:  Bernerd Limbo, MD  Cardiologist:  Johnsie Cancel   Chief Complaint  Patient presents with   Follow-up    Seen for Dr. Johnsie Cancel    History of Present Illness: Patricia Wall is a 69 y.o. female who presents today for a work in visit. Seen for Dr. Johnsie Cancel.   She has a history of known CAD with multiple stenting, PVD and progressive COPD on home supplemental O2, 4L with ongoing smoking use.   She had stenting to LAD, distal LCx and OM2 in 2005, in-stent restenosis with balloon in 2006, cath with patent stents in 2011. Had recurrent chest pain and underwent re-stenting to LCx in 2015. PVD hx includes 66-59% LICA stenosis that has not been checked since 2016. Echocardiogram from 2016 and EF 65-70% estimated PA 38 mmHg no valve disease.    Last seen by Dr. Johnsie Cancel in February of 2020. Saw Kathyrn Drown, NP back in August - intermittent chest pain with NTG use noted. She was not interested in further testing given her severe COPD. Also continues to smoke 2 to 3 cigarettes/day. She opted to maintain her current status.   The patient and daughter do not have symptoms concerning for COVID-19 infection (fever, chills, cough, or new shortness of breath).   Comes in today. Here with her daughter.  Needing to be put on our oxygen tank - her sat is 88%. Admitted to [facility] earlier this month. Says she has been found to have CHF and PAF. Placed on metoprolol and CCB and had her Lasix increased. Decision to start anticoagulation was deferred to cardiology ([place]). Was there about 10 days. No records other than what her daughter brought and she was not allowed in during that stay. She was transfused 2 units of blood - reportedly heme negative but does not sound like she had any work up. She was not placed on anticoagulation but remains on aspirin and Plavix. She does not know what her counts  are. Still smoking a few cigarettes - thinking of stopping. Balance is noted to be very poor - she falls.   Social situation quite challenging - she lives with her husband who has had a stroke - daughter here in [place] taking care of her disabled husband.   She has seen PCP ([place]) last week. She saw Dr. Melvyn Novas last week - these notes were reviewed. Basically care and further decisions deferred here.   She remains a full code - daughter and patient wish "for everything to be done to keep her alive".   Past Medical History:  Diagnosis Date   Abnormal chest xray    Alcohol abuse    Back pain    CAD (coronary artery disease)     Prev seen by [facility].  Stent to OM and LAD Last cath 6/11 cathed on June 13, [name]. 2011.  The cardiac catheterization showed 30% and 50% lesions in the mid  and distal LAD, 50% in the OM1, 40% in the OM2 with 20% in-stent  restenosis, 40% in-stent restenosis in the circumflex and no significant  disease in the RCA.  Her EF was normal.  Dr. Olevia Perches recommended a trial  of proton pump inhibitors    Chest pain    COPD (chronic obstructive pulmonary disease) ([place])    Cyanosis    DDD (degenerative disc disease)    Depression  Diarrhea    Diverticular disease    GERD (gastroesophageal reflux disease)    H/O: hysterectomy    Hemorrhoids    HTN (hypertension)    IBS (irritable bowel syndrome)    Insomnia    Lymphadenitis    Pharyngitis    PUD (peptic ulcer disease)    PVD (peripheral vascular disease) (H[facility])    Sinusitis    TIA (transient ischemic attack)    Tobacco abuse    Vertigo     Past Surgical History:  Procedure Laterality Date   ABDOMINAL HYSTERECTOMY     APPENDECTOMY     BREAST LUMPECTOMY Left 2015   CARDIAC CATHETERIZATION  2005, 2006, 2009,2011   CHOLECYSTECTOMY     CORONARY STENT PLACEMENT     Status post balloon angiogram plasty and Cypher stent to the distal    circumflex and OM2 branch   Hammertoe repair       Left Bunionectomy     LEFT HEART CATHETERIZATION WITH CORONARY ANGIOGRAM N/A 12/03/2013   Procedure: LEFT HEART CATHETERIZATION WITH CORONARY ANGIOGRAM;  Surgeon: Leonie Man, MD;  Location[facility] CATH LAB;  Service: Cardiovascular;  Laterality: N/A;   TUBAL LIGATION       Medications: Current Meds  Medication Sig   acetaminophen-codeine (TYLENOL #3) 300-30 MG tablet TAKE 1 TABLET EVERY 4 HOURS AS NEEDED FOR COUGH   acidophilus (RISAQUAD) CAPS capsule Take 1 capsule by mouth 2 (two) times daily.   albuterol (PROVENTIL) (2.5 MG/3ML) 0.083% nebulizer solution USE 1 VIAL IN NEBULIZER EVERY 4 HOURS AS NEEDED FOR WHEEZING OR SHORTNESS OF BREATH.   albuterol (VENTOLIN HFA) 108 (90 Base) MCG/ACT inhaler Inhale 2 puffs into the lungs every 6 (six) hours as needed for wheezing or shortness of breath.   aspirin 81 MG tablet Take 81 mg by mouth at bedtime.    atorvastatin (LIPITOR) 80 MG tablet Take 80 mg by mouth daily.   azithromycin (ZITHROMAX) 500 MG tablet Take 500 mg by mouth daily.   blood glucose meter kit and supplies Dispense based on patient and insurance preference. Use up to four times daily as directed. (FOR ICD-9 250.00, 250.01).   budesonide-formoterol (SYMBICORT) 160-4.5 MCG/ACT inhaler Inhale 2 puffs into the lungs 2 (two) times daily.   butalbital-acetaminophen-caffeine (FIORICET, ESGIC) 50-325-40 MG tablet 1 - 1 and 1/2 every 8 hours as needed for headache   calcium carbonate (OS-CAL) 600 MG TABS Take 600 mg by mouth every morning.    Cholecalciferol (VITAMIN D) 2000 UNITS tablet Take 2,000 Units by mouth every morning.    clopidogrel (PLAVIX) 75 MG tablet Take 75 mg by mouth every morning.    Dextromethorphan-Guaifenesin (MUCINEX FAST-MAX DM MAX) 5-100 MG/5ML LIQD 1 tsp every 4 hours as needed for cough/congestion   Diclofenac Potassium (CAMBIA) 50 MG PACK Take at onset of headache as needed    diltiazem (CARDIZEM CD) 120 MG 24 hr capsule Take 1 capsule by  mouth daily.   donepezil (ARICEPT) 5 MG tablet Take 1 tablet by mouth daily.   DULoxetine (CYMBALTA) 60 MG capsule Take 60 mg by mouth every morning.    Erenumab-aooe (AIMOVIG [place]) Inject into the skin as directed.   furosemide (LASIX) 40 MG tablet Take 1 tablet by mouth 2 (two) times daily.   gabapentin (NEURONTIN) 300 MG capsule Take 300 mg by mouth 3 (three) times daily.    Lancets (FREESTYLE) lancets Use as instructed   letrozole (FEMARA) 2.5 MG tablet Take 2.5 mg by mouth every morning.  losartan (COZAAR) 25 MG tablet Take 1 tablet by mouth daily.   metFORMIN (GLUCOPHAGE) 1000 MG tablet Take 1,000 mg by mouth 2 (two) times daily with a meal.   methocarbamol (ROBAXIN) 750 MG tablet Take 750 mg by mouth every 8 (eight) hours as needed for muscle spasms.   metoprolol succinate (TOPROL-XL) 25 MG 24 hr tablet Take 1 tablet by mouth daily.   Multiple Vitamin (MULTIVITAMIN) tablet Take 1 tablet by mouth every morning.    nitroGLYCERIN (NITROSTAT) 0.4 MG SL tablet Place 1 tablet (0.4 mg total) under the tongue every 5 (five) minutes as needed for chest pain (may repeat x3).   omeprazole (PRILOSEC) 40 MG capsule Take 40 mg by mouth daily before breakfast.    ondansetron (ZOFRAN) 8 MG tablet Take 8 mg by mouth every 4 (four) hours as needed for nausea.    OXYGEN Use 4L 24/7   potassium chloride SA (KLOR-CON M20) 20 MEQ tablet Take 1 tablet by mouth 2 (two) times daily.   Respiratory Therapy Supplies (FLUTTER) DEVI Use as directed   Tiotropium Bromide Monohydrate (SPIRIVA RESPIMAT) 2.5 MCG/ACT AERS Inhale 2 puffs into the lungs daily.     Allergies: No Known Allergies  Social History: The patient  reports that she has been smoking cigarettes. She has a 104.00 pack-year smoking history. She has never used smokeless tobacco. She reports current drug use. She reports that she does not drink alcohol.   Family History: The patient's family history includes Asthma in her sister;  Cancer - Other in her father and mother; Coronary artery disease in an other family member; Emphysema in her father; Heart disease in her father and mother; Rheumatologic disease in her mother; Stroke in her sister.   Review of Systems: Please see the history of present illness.   All other systems are reviewed and negative.   Physical Exam: VS:  BP 122/64    Pulse 98    SpO2 (!) 88%  .  BMI There is no height or weight on file to calculate BMI. She was not able to weigh here today.   Wt Readings from Last 3 Encounters:  04/12/19 152 lb (68.9 kg)  01/11/19 147 lb (66.7 kg)  11/14/18 150 lb 6.4 oz (68.2 kg)    General: Chronically ill. Looks older than her stated age. Alert and in no acute distress.  In wheelchair. Color is very sallow/pasty. Very congested cough.  HEENT: Normal.  Neck: Supple, no JVD, carotid bruits, or masses noted.  Cardiac: Regular rate and rhythm. No murmurs, rubs, or gallops. 1+ edema bilaterally.  Respiratory:  Lungs are clear to auscultation bilaterally with normal work of breathing.  GI: Soft and nontender.  MS: No deformity or atrophy. Gait not tested. She is in a wheelchair.  Skin: Warm and dry. Color is quite sallow.  Neuro:  Strength and sensation are intact and no gross focal deficits noted.  Psych: Alert, appropriate and with normal affect.   LABORATORY DATA:  EKG:  EKG is ordered today. This shows NSR - HR is 98 - PVC - unchanged.   Lab Results  Component Value Date   WBC 6.9 03/26/2018   HGB 11.4 (L) 03/26/2018   HCT 34.3 (L) 03/26/2018   PLT 181.0 03/26/2018   GLUCOSE 87 03/26/2018   CHOL  08/29/2009    157        ATP III CLASSIFICATION:  <200     mg/dL   Desirable  200-239  mg/dL   Borderline High  >=  240    mg/dL   High          TRIG 313 (H) 08/29/2009   HDL 31 (L) 08/29/2009   LDLCALC  08/29/2009    63        Total Cholesterol/HDL:CHD Risk Coronary Heart Disease Risk Table                     Men   Women  1/2 Average Risk   3.4    3.3  Average Risk       5.0   4.4  2 X Average Risk   9.6   7.1  3 X Average Risk  23.4   11.0        Use the calculated Patient Ratio above and the CHD Risk Table to determine the patient's CHD Risk.        ATP III CLASSIFICATION (LDL):  <100     mg/dL   Optimal  100-129  mg/dL   Near or Above                    Optimal  130-159  mg/dL   Borderline  160-189  mg/dL   High  >190     mg/dL   Very High   ALT 19 12/04/2009   AST 21 12/04/2009   NA 140 03/26/2018   K 4.1 03/26/2018   CL 96 03/26/2018   CREATININE 0.74 03/26/2018   BUN 11 03/26/2018   CO2 43 (H) 03/26/2018   TSH 0.533 01/08/2015   INR 1.0 12/02/2013   HGBA1C 7.7 (H) 01/08/2015     BNP (last 3 results) No results for input(s): BNP in the last 8760 hours.  ProBNP (last 3 results) No results for input(s): PROBNP in the last 8760 hours.   Other Studies Reviewed Today:  Echocardiogram 01/09/2015: Study Conclusions  - Left ventricle: The cavity size was normal. There was mild focal basal hypertrophy of the septum. Systolic function was vigorous. The estimated ejection fraction was in the range of 65% to 70%. Wall motion was normal; there were no regional wall motion abnormalities. Doppler parameters are consistent with abnormal left ventricular relaxation (grade 1 diastolic dysfunction). - Left atrium: The atrium was mildly dilated. - Right ventricle: The cavity size was mildly dilated. Wall thickness was normal. - Right atrium: The atrium was mildly dilated. - Pulmonary arteries: Systolic pressure was mildly increased. PA peak pressure: 38 mm Hg (S).  ASSESSMENT AND PLAN:  1. Recent admission - very unclear details - noted "CHF and PAF" - no records noted and not clear at all as to what has been done/needs to be done.   2. PAF - she is in sinus today. She would be high risk for anticoagulation due to falls and we have unknown etiology of reason for transfusion.   3. CAD - multiple  PCIs - noted that she was not previously interested in aggressive interventions - she is back on DAPT - this may need to be stopped.   4. Unexplained anemia - does not see GI - unclear what work up has been done - she seemed overwhelmed with the possibility of a GI work up - I think she would be at high risk for those procedures given her progressive COPD.   5. Carotid disease - she missed her recent visit for repeat doppler due to recent admission - this was not addressed.   6. Progressive COPD - on oxygen - very limited mobility -  seems more end stage to me.   7. HLD - on statin - not discussed.   8. Reports of CHF - will arrange echo - she does not feel like this was done at [facility]. Her lasix was already increased. Swelling reported improved. Lab today to include BNP.   9. Tobacco abuse - she is thinking of using patches.   10. COVID-19 Education: The signs and symptoms of COVID-19 were discussed with the patient and how to seek care for testing (follow up with PCP or arrange E-visit).  The importance of social distancing, staying at home, hand hygiene and wearing a mask when out in public were discussed today.  Current medicines are reviewed with the patient today.  The patient does not have concerns regarding medicines other than what has been noted above.  The following changes have been made:  See above.  Labs/ tests ordered today include:    Orders Placed This Encounter  Procedures   Basic metabolic panel   CBC   Hepatic function panel   Pro b natriuretic peptide (BNP)   EKG 12-Lead   ECHOCARDIOGRAM COMPLETE     Disposition:   FU with [place] next week - will try to arrange for a day that Dr.  Johnsie Cancel is here in the office to facilitate discussion. I am rechecking her lab today. Have gotten release for records from [place] - this will be imperative. Arranging for echo here later this week. I have not changed her medicines until we get further information but it may be  that she is not a candidate for any anticoagulation due to this anemia and need for transfusion. Seems to be very poor candidate for [facility] therapy given falls/balance issues. Seems to have some underlying disorder and looks to be end stage in my opinion - but I have never seen her prior to today - very worrisome situation for malignancy. She wishes to remain a full code but then seemed quite overwhelmed to the care/testing/evaluation that could ensue. Overall prognosis looks very tenuous to me.    Patient is agreeable to this plan and will call if any problems develop in the interim.   SignedTruitt Merle, NP  04/15/2019 12:29 PM  [facility] [address] [place], [place]  [address] Phone: [phone] Fax: [phone]       Addendum: Records from [facility] have been obtained and reviewed 04/16/19   She was admitted with acute hypoxemic respiratory failure - COVID negative but imaging consistent with pneumonia/CHF. Treated with antibiotics and diuresed and oxygen. Hemoglobin was 6.2 - she was transfused.  Noted PAF with episodes of RVR - started on CCB and beta blocker. Anticoagulation was deferred to [place] but Plavix/Aspirin was restarted at discharge. Echo with low normal EF at 50 to 55%, TDS with suboptimal views, moderate TR, and RV enlargement. She was seen by Dr. Agustin Cree due to minimally elevated troponin - no further testing felt to be needed - he felt this was the result of demand ischemia. CT of the chest with small to moderate bilateral effusions and atelectasis. Noted transaminitis - GB US was negative - she has had prior cholecystectomy.   Burtis Junes, RN, [facility] [address] [place], [place]  [address] [phone]

## 2019-04-12 ENCOUNTER — Encounter: Payer: Self-pay | Admitting: Internal Medicine

## 2019-04-12 ENCOUNTER — Other Ambulatory Visit: Payer: Self-pay

## 2019-04-12 ENCOUNTER — Ambulatory Visit (INDEPENDENT_AMBULATORY_CARE_PROVIDER_SITE_OTHER): Payer: Medicare Other | Admitting: Internal Medicine

## 2019-04-12 DIAGNOSIS — J9612 Chronic respiratory failure with hypercapnia: Secondary | ICD-10-CM | POA: Diagnosis not present

## 2019-04-12 DIAGNOSIS — J9611 Chronic respiratory failure with hypoxia: Secondary | ICD-10-CM | POA: Diagnosis not present

## 2019-04-12 DIAGNOSIS — F17210 Nicotine dependence, cigarettes, uncomplicated: Secondary | ICD-10-CM

## 2019-04-12 DIAGNOSIS — J449 Chronic obstructive pulmonary disease, unspecified: Secondary | ICD-10-CM | POA: Diagnosis not present

## 2019-04-12 NOTE — Progress Notes (Signed)
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 rec Prilosec Take 30-60 min before first meal of the day  Stop all inhalers except ventolin and use it up to 2 puffs every 4 hours if can't catch your breath Anoro open once and take two dep drags each am as soon as you get up each day      03/02/2017  f/u ov/[name] re:  COPD II/ still smoking / no med calendar  Chief Complaint  Patient presents with  . Follow-up    She states she is getting over a cold. Pt has some rhinitis. She has been using albuterol inhaler and neb 2 x daily on average.   sleeping ok on 2 pillows and 2lpm  Very congested cough/ doesn't think her flutter is effective but not sure it's working right and didn't bring it to check out with most of the mucus thick beige esp in am's but not disctubing sleep No change doe =  MMRC3 even on 2lpm with sats typically in the low 90's when stops due to sob  rec Prednisone 10 mg take  4 each am x 2 days,   2 each am x 2 days,  1 each am x 2 days and stop  zpak  I refilled your tylenol #3    07/13/2017  f/u ov/Patricia Wall re:  Copd GOLD II/ still smoking/ not using med cal  Chief Complaint  Patient presents with  . Follow-up    Breathing has been worswe for the past wk. Sats when she arrived today were 73%2lpm pulsed o2--increased to 93% when placed on 4lpm cont. flow o2.  She is  sleeping more and feeling dizzy all the time. She has been using her albuterol inhaler 3 x per day and has been out of her neb.   Dyspnea:  Worse x one week, 2lpm 24/8 > up to 4lpm  And sob across the room Cough: thick yellow -  Not using  flutter  Sleep: 2 pillows  SABA use:  Helps some, over using since ran out of neb saba rec Goal is to keep 02 sat above 90% all the time   Plan A = Automatic = symbicort 160 Take 2 puffs first thing in am and then another 2 puffs about 12 hours later Spiriva 2 pffs after symbicort am dose only  Work on maintaining inhaler technique: Only use your albuterol (Ventolin) as a rescue medication Plan C = Crisis - only use your albuterol nebulizer if you first try Plan B The key is to stop smoking completely before smoking completely stops you!  Avoid dairy products and undercooked vegetables and salads and follow up with your GI doctor if diarrhea not better also could try off prilosec and just take pepcid 20  mg twice daily        03/26/2018  f/u ov/Patricia Wall re:  GOLD II copd/ still smoking ? aecopd  Chief Complaint  Patient presents with  . Acute Visit    Increased SOB x 1 wk. She has had occ pressure and tightness in her chest. She is having episodes where she starts shaking and feels like she can not move. She is using her albuterol inhaler about once per day and used neb last about 2 wks ago.   Dyspnea:  Room to room on 4lpm  Cough: nl for her =sporadic / worse p supper / min mucoid  Sleeping: bed flat, two pillows  SABA use: hfa  02: 4lpm 24/7 sats typiclly low 90s rec Please remember to go to the lab department   for your tests - we will call you with the results when they are available.  No change on 02 > goal is to keep sats in low 90's     09/24/2018  f/u ov/Patricia Wall re: GOLD II  Copd / still smoking  Hypercarbic/ hypoxemia  Chief Complaint  Patient presents with  . Follow-up    Breathing has been getting progressively worse since the last visit.  She states her nose runs constantly. She has prod cough with brown sputum in the mornings. She rarely uses her albuterol inhaler. She uses her neb about once per day.    Dyspnea:  Walks with walker at home/ uses w/c to go out - across the room and sits due to back and breathing Cough: min in am / brown in am then turns clear / has flutter not using  Sleeping: flat / 3 pillows SABA use: neb p supper / did not use maint rx on day of ov  02: 4lpm 24/7  rec Change the omeprazole to where you take it 30-60 min before your first meal  The key is to stop smoking completely before smoking completely stops you!     01/11/2019  f/u ov/Patricia Wall re: copd II spiriometry but 02 dep/ rx symb /spiriva Chief Complaint  Patient presents with  . Follow-up    Breathing getting worse and still c/o runny nose. She is using her albuterol inhaler 3 x daily on average.   Dyspnea:  Walks with walker/ w/c to leave home/ no recent change   Cough: no Sleeping: bed is flat and 3 pillows SABA use: as above, not sure it's helps  02: 4lpm 24/7  rec No change in medications If worse > Prednisone 10 mg take  4 each am x 2 days,   2 each am x 2 days,  1 each am x 2 days and stop    Dec 2020 downhill worse sob, ams/ swelling in legs > admit [facility] 1/8 -04/05/2019 new afib and dx of chf /osa and placed on cpap by Chodri    04/12/2019  f/u ov/Patricia Wall re: COPD GOLD II still smoking/ 02 dep  New dx chf Chief Complaint  Patient presents with  . Follow-up    Recent admit to [facility] with afib and hemoglobin of 2. She states today that her breathing is doing well. She has started on CPAP at night with her o2 at 4 lpm. She states she rarely uses her rescue inhaler.    Dyspnea:  Room to room with walker /on 4lpm Cough: no  Sleeping: cpap new  > sleeping fine flat > f/u Chodri planned  SABA use: rarely needs while main on symb/spiriva 02: 4lpm 24/7 does not titrate  No obvious day to day or daytime variability or assoc  excess/ purulent sputum or mucus plugs or hemoptysis or cp or chest tightness, subjective wheeze or overt sinus or hb symptoms.   sleeping as above  without nocturnal  or early am exacerbation  of respiratory  c/o's or need for noct saba. Also denies any obvious fluctuation of symptoms with weather or environmental changes or other aggravating or alleviating factors except as outlined above   No unusual exposure hx or h/o childhood pna/ asthma or knowledge of premature birth.  Current Allergies, Complete Past Medical History, Past Surgical History, Family History, and Social History were reviewed in [employer] record.  ROS  The following are not active complaints unless bolded Hoarseness, sore throat, dysphagia, dental problems, itching, sneezing,  nasal congestion or discharge of excess mucus or purulent secretions, ear ache,   fever, chills, sweats, unintended wt loss or wt gain, classically pleuritic or exertional cp,  orthopnea pnd or arm/hand swelling  or leg swelling, presyncope, palpitations, abdominal pain, anorexia, nausea, vomiting, diarrhea  or change in bowel habits or change in bladder habits, change in stools or change in urine, dysuria, hematuria,  rash, arthralgias, visual complaints, headache, numbness, weakness or ataxia or problems with walking or coordination,  change in mood or  memory.        Current Meds  Medication Sig  . acetaminophen-codeine (TYLENOL #3) 300-30 MG tablet TAKE 1 TABLET EVERY 4 HOURS AS NEEDED FOR COUGH  . albuterol (PROVENTIL) (2.5 MG/3ML) 0.083% nebulizer solution USE 1 VIAL IN NEBULIZER EVERY 4 HOURS AS NEEDED FOR WHEEZING OR SHORTNESS OF BREATH.  [name] albuterol (VENTOLIN HFA) 108 (90 Base) MCG/ACT inhaler Inhale 2 puffs into the lungs every 6 (six) hours as needed for wheezing or shortness of breath.  [name] aspirin 81 MG tablet Take 81 mg by mouth at bedtime.   [name] atorvastatin (LIPITOR) 80 MG tablet Take 80 mg by mouth daily.  [name]  azithromycin (ZITHROMAX) 500 MG tablet Take 500 mg by mouth daily.  . blood glucose meter kit and supplies Dispense based on patient and insurance preference. Use up to four times daily as directed. (FOR ICD-9 250.00, 250.01).  . budesonide-formoterol (SYMBICORT) 160-4.5 MCG/ACT inhaler Inhale 2 puffs into the lungs 2 (two) times daily.  . butalbital-acetaminophen-caffeine (FIORICET, ESGIC) 50-325-40 MG tablet 1 - 1 and 1/2 every 8 hours as needed for headache  . calcium carbonate (OS-CAL) 600 MG TABS Take 600 mg by mouth every morning.   . Cholecalciferol (VITAMIN D) 2000 UNITS tablet Take 2,000 Units by mouth every morning.   . clopidogrel (PLAVIX) 75 MG tablet Take 75 mg by mouth every morning.   [name] Dextromethorphan-Guaifenesin (MUCINEX FAST-MAX DM MAX) 5-100 MG/5ML LIQD 1 tsp every 4 hours as needed for cough/congestion  . Diclofenac Potassium (CAMBIA) 50 MG PACK Take at onset of headache as needed   . diltiazem (CARDIZEM CD) 120 MG 24 hr capsule Take 1 capsule by mouth daily.  [name] donepezil (ARICEPT) 5 MG tablet Take 1 tablet by mouth daily.  . DULoxetine (CYMBALTA) 60 MG capsule Take 60 mg by mouth every morning.   Eduard Roux (AIMOVIG [place]) Inject into the skin as directed.  . furosemide (LASIX) 40 MG tablet Take 1 tablet by mouth 2 (two) times daily.  [name] gabapentin (NEURONTIN) 300 MG capsule Take 300 mg by mouth 3 (three) times daily.   . Lancets (FREESTYLE) lancets Use as instructed  . letrozole (FEMARA) 2.5 MG tablet Take 2.5  mg by mouth every morning.   [name] losartan (COZAAR) 25 MG tablet Take 1 tablet by mouth daily.  . metFORMIN (GLUCOPHAGE) 1000 MG tablet Take 1,000 mg by mouth 2 (two) times daily with a meal.  . methocarbamol (ROBAXIN) 750 MG tablet Take 750 mg by mouth every 8 (eight) hours as needed for muscle spasms.  . metoprolol succinate (TOPROL-XL) 25 MG 24 hr tablet Take 1 tablet by mouth daily.  . Multiple Vitamin (MULTIVITAMIN) tablet Take 1 tablet by mouth every morning.    . nitroGLYCERIN (NITROSTAT) 0.4 MG SL tablet Place 1 tablet (0.4 mg total) under the tongue every 5 (five) minutes as needed for chest pain (may repeat x3).  [name] omeprazole (PRILOSEC) 40 MG capsule Take 40 mg by mouth daily before breakfast.   . ondansetron (ZOFRAN) 8 MG tablet Take 8 mg by mouth every 4 (four) hours as needed for nausea.   . OXYGEN Use 4L 24/7  . potassium chloride SA (KLOR-CON M20) 20 MEQ tablet Take 1 tablet by mouth 2 (two) times daily.  [name] Respiratory Therapy Supplies (FLUTTER) DEVI Use as directed  . Tiotropium Bromide Monohydrate (SPIRIVA RESPIMAT) 2.5 MCG/ACT AERS Inhale 2 puffs into the lungs daily.                 Objective:  Physical Exam  04/12/2019  152 01/11/2019   147  09/24/2018   155  04/02/2013  198  >   10/02/2013   207 >  201 10/15/13 > 12/30/2013  207 >209 01/27/2014 > 05/02/2014 215>211 05/30/14 > 07/25/2014   214 > 10/31/2014  221 >213 12/09/14 >205 01/30/2015 > 02/10/2015    202 > 04/02/2015  206 >   10/08/2015   182 > 01/11/2016 177 >  04/28/2016  181 >  07/26/2016   184   > 03/02/2017  186  > 07/13/2017   185 > 03/26/2018   174       w/c bound pale wf nad  Vital signs reviewed  04/12/2019  - Note at rest 02 sats  99% on 4lpm pulsed     HEENT : pt wearing mask not removed for exam due to covid -19 concerns.    NECK :  without JVD/Nodes/TM/ nl carotid upstrokes bilaterally   LUNGS: no acc muscle use,  Mod barrel  contour chest wall with bilateral  Distant bs s audible wheeze and  without cough on insp or exp maneuvers and mod  Hyperresonant  to  percussion bilaterally     CARD:  Apical pulse slt irreg   no s3 or murmur or increase in P2, and trace bilat sym pedal edema   ABD:  soft and nontender with pos mid insp Hoover's  in the supine position. No bruits or organomegaly appreciated, bowel sounds nl  MS:     ext warm without deformities, calf tenderness, cyanosis or clubbing No obvious joint restrictions   SKIN: warm and dry without lesions    NEURO:   alert, approp, nl sensorium with  no motor or cerebellar deficits apparent.         I personally reviewed images and agree with radiology impression as follows:  CXR:   Apr 04 2019  Lungs are mildly hyperinflated. Heart size is normal. There has been improvement in aeration of the lung bases with mild bibasilar opacities persisting. No pulmonary edema.         Assessment:

## 2019-04-12 NOTE — Patient Instructions (Signed)
No change in medications  See Chodri for cpap issues   Please schedule a follow up visit in  6 months but call sooner if needed

## 2019-04-13 ENCOUNTER — Encounter: Payer: Self-pay | Admitting: Internal Medicine

## 2019-04-13 NOTE — Assessment & Plan Note (Signed)
See ono 09/29/13 > desat x 54 min < 89% > rec 2lpm  - 07/26/2016 Patient Saturations on Room Air at Rest = 87%---increased to 92% on 2lpm pulsed o2  - HCO3  03/26/2018 = 43   - 01/11/2019  02 sats  96% on 4lpm pulsed 02 and 70% on RA > continue 4lpm 24/7   Now seeing Chodri also for osa > rec keep those appts and see [place] back her q6 m or prn, whichever comes first

## 2019-04-13 NOTE — Assessment & Plan Note (Signed)
Counseled re importance of smoking cessation but did not meet time criteria for separate billing  / did review dangers of cigs an d02          Each maintenance medication was reviewed in detail including emphasizing most importantly the difference between maintenance and prns and under what circumstances the prns are to be triggered using an action plan format where appropriate.  Total time for H and P, chart review, counseling,   and generating customized AVS unique to this office visit / charting = 30 min

## 2019-04-13 NOTE — Assessment & Plan Note (Signed)
Active smoker  - pft's 12/12/12 FEV1  1.41 (53%) ratio 54 with no significant reversibility and DLCO 47% corrects to 50 01/16/2013  Walked RA x 2 laps @ 185 ft each stopped due to sob, no desat     - Trial of anoro 01/16/2013 > helped but insurance would not pay - incruse one daily started 10/02/2013 >>>not covered  -Spiriva restarted 10/15/13 > changed to [facility] 12/30/13 but not consistent with it  10/15/13 Med calendar , 01/27/2014 > lost it at f/u 05/02/14 ,  Did not understand how to use it at f/u ov 07/25/14  05/30/14 refer to pulm rehab  - 07/13/2017  Restarted flutter valve prn  - 08/10/2017  After extensive coaching inhaler device  effectiveness =    90% from baseline 75%   Despite new dx of chf and active smoking she is well compensated at present but clearly very debilitated and very inactive so not as limited by the mechanical ceiling placed on her ventilatory mechanics by copd    Group D in terms of symptom/risk and laba/lama/ICS  therefore appropriate rx at this point >>>  Continue symb/spiriva

## 2019-04-15 ENCOUNTER — Telehealth: Payer: Self-pay | Admitting: *Deleted

## 2019-04-15 ENCOUNTER — Other Ambulatory Visit: Payer: Self-pay

## 2019-04-15 ENCOUNTER — Encounter: Payer: Self-pay | Admitting: Nurse Practitioner

## 2019-04-15 ENCOUNTER — Ambulatory Visit (INDEPENDENT_AMBULATORY_CARE_PROVIDER_SITE_OTHER): Payer: Medicare Other | Admitting: Nurse Practitioner

## 2019-04-15 VITALS — BP 122/64 | HR 98

## 2019-04-15 DIAGNOSIS — E785 Hyperlipidemia, unspecified: Secondary | ICD-10-CM | POA: Diagnosis not present

## 2019-04-15 DIAGNOSIS — I10 Essential (primary) hypertension: Secondary | ICD-10-CM | POA: Diagnosis not present

## 2019-04-15 DIAGNOSIS — I739 Peripheral vascular disease, unspecified: Secondary | ICD-10-CM | POA: Diagnosis not present

## 2019-04-15 DIAGNOSIS — I480 Paroxysmal atrial fibrillation: Secondary | ICD-10-CM

## 2019-04-15 DIAGNOSIS — I2510 Atherosclerotic heart disease of native coronary artery without angina pectoris: Secondary | ICD-10-CM | POA: Diagnosis not present

## 2019-04-15 NOTE — Telephone Encounter (Signed)
S/w pt's daughter per [facility]) is aware echo has been canceled this week due to pt had echo in hospital. Confirmed appt for next week with Norma Fredrickson, NP.

## 2019-04-15 NOTE — Patient Instructions (Addendum)
After Visit Summary:  We will be checking the following labs today - BMET, CBC, BNP and LFTs   Medication Instructions:    Continue with your current medicines for now.    If you need a refill on your cardiac medications before your next appointment, please call your pharmacy.     Testing/Procedures To Be Arranged:  Echocardiogram  Follow-Up:   See me and Dr. Eden Emms next week.     At [facility], you and your health needs are our priority.  As part of our continuing mission to provide you with exceptional heart care, we have created designated Provider Care Teams.  These Care Teams include your primary Cardiologist (physician) and Advanced Practice Providers (APPs -  Physician Assistants and Nurse Practitioners) who all work together to provide you with the care you need, when you need it.  Special Instructions:  . Stay safe, stay home, wash your hands for at least 20 seconds and wear a mask when out in public.  [place] to use nicotine patches over the counter    Call the [facility] office at [phone] if you have any questions, problems or concerns.

## 2019-04-16 ENCOUNTER — Telehealth: Payer: Self-pay | Admitting: Nurse Practitioner

## 2019-04-16 ENCOUNTER — Other Ambulatory Visit: Payer: Self-pay | Admitting: *Deleted

## 2019-04-16 LAB — BASIC METABOLIC PANEL
BUN/Creatinine Ratio: 28 (ref 12–28)
BUN: 20 mg/dL (ref 8–27)
CO2: 39 mmol/L — ABNORMAL HIGH (ref 20–29)
Calcium: 9.4 mg/dL (ref 8.7–10.3)
Chloride: 90 mmol/L — ABNORMAL LOW (ref 96–106)
Creatinine, Ser: 0.72 mg/dL (ref 0.57–1.00)
GFR calc Af Amer: 98 mL/min/{1.73_m2} (ref >59)
GFR calc non Af Amer: 85 mL/min/{1.73_m2} (ref >59)
Glucose: 206 mg/dL — ABNORMAL HIGH (ref 65–99)
Potassium: 4.5 mmol/L (ref 3.5–5.2)
Sodium: 142 mmol/L (ref 134–144)

## 2019-04-16 LAB — HEPATIC FUNCTION PANEL
ALT: 32 IU/L (ref 0–32)
AST: 18 IU/L (ref 0–40)
Albumin: 3.4 g/dL — ABNORMAL LOW (ref 3.8–4.8)
Alkaline Phosphatase: 82 IU/L (ref 39–117)
Bilirubin Total: 0.4 mg/dL (ref 0.0–1.2)
Bilirubin, Direct: 0.2 mg/dL (ref 0.00–0.40)
Total Protein: 5.5 g/dL — ABNORMAL LOW (ref 6.0–8.5)

## 2019-04-16 LAB — CBC
Hematocrit: 27.5 % — ABNORMAL LOW (ref 34.0–46.6)
Hemoglobin: 8.2 g/dL — ABNORMAL LOW (ref 11.1–15.9)
MCH: 27.2 pg (ref 26.6–33.0)
MCHC: 29.8 g/dL — ABNORMAL LOW (ref 31.5–35.7)
MCV: 91 fL (ref 79–97)
Platelets: 165 10*3/uL (ref 150–450)
RBC: 3.02 x10E6/uL — ABNORMAL LOW (ref 3.77–5.28)
RDW: 17.8 % — ABNORMAL HIGH (ref 11.7–15.4)
WBC: 7.4 10*3/uL (ref 3.4–10.8)

## 2019-04-16 LAB — PRO B NATRIURETIC PEPTIDE: NT-Pro BNP: 711 pg/mL — ABNORMAL HIGH (ref 0–301)

## 2019-04-16 NOTE — Telephone Encounter (Signed)
Patient's daughter Larita Fife calling to discuss the patient's health condition. She states she will also send a message through the patient's mychart.

## 2019-04-16 NOTE — Telephone Encounter (Signed)
Pt's daughter calling in today per (DPR) on file to discuss lab results.  Lawson Fiscal already s/w other daughter today with a lengthy conversation and will have to get in touch with sister for info on pt.

## 2019-04-16 NOTE — Telephone Encounter (Signed)
I have called the patient to get permission to speak to [facility] - patient did give me permission.   Long conversation with Orlie Pollen regarding the recent admission, those findings and our findings from our visit yesterday as well as our plan to stop all anticoagulation and not start any new form of anticoagulation. She is aware that we are stopping aspirin and Plavix as of today, to see PCP to discuss GI referral and have also discussed need for possible Hospice services. She was very appreciative of our conversation.   Rosalio Macadamia, RN, ANP-C [facility] [address] [place], [place]  [address] [phone]

## 2019-04-17 ENCOUNTER — Other Ambulatory Visit (HOSPITAL_COMMUNITY): Payer: Medicare Other

## 2019-04-17 ENCOUNTER — Emergency Department (HOSPITAL_COMMUNITY): Payer: Medicare Other

## 2019-04-17 ENCOUNTER — Encounter (HOSPITAL_COMMUNITY): Payer: Self-pay | Admitting: Emergency Medicine

## 2019-04-17 ENCOUNTER — Inpatient Hospital Stay (HOSPITAL_COMMUNITY)
Admission: EM | Admit: 2019-04-17 | Discharge: 2019-04-23 | DRG: 246 | Disposition: A | Discharge status: Home / Self Care | Payer: Medicare Other | Attending: Internal Medicine | Admitting: Internal Medicine

## 2019-04-17 DIAGNOSIS — Z8711 Personal history of peptic ulcer disease: Secondary | ICD-10-CM

## 2019-04-17 DIAGNOSIS — I2583 Coronary atherosclerosis due to lipid rich plaque: Secondary | ICD-10-CM | POA: Diagnosis not present

## 2019-04-17 DIAGNOSIS — I739 Peripheral vascular disease, unspecified: Secondary | ICD-10-CM | POA: Diagnosis present

## 2019-04-17 DIAGNOSIS — G9340 Encephalopathy, unspecified: Secondary | ICD-10-CM | POA: Diagnosis present

## 2019-04-17 DIAGNOSIS — J441 Chronic obstructive pulmonary disease with (acute) exacerbation: Secondary | ICD-10-CM | POA: Diagnosis present

## 2019-04-17 DIAGNOSIS — E876 Hypokalemia: Secondary | ICD-10-CM | POA: Diagnosis present

## 2019-04-17 DIAGNOSIS — I480 Paroxysmal atrial fibrillation: Secondary | ICD-10-CM

## 2019-04-17 DIAGNOSIS — Z9981 Dependence on supplemental oxygen: Secondary | ICD-10-CM

## 2019-04-17 DIAGNOSIS — L8990 Pressure ulcer of unspecified site, unspecified stage: Secondary | ICD-10-CM | POA: Insufficient documentation

## 2019-04-17 DIAGNOSIS — J189 Pneumonia, unspecified organism: Secondary | ICD-10-CM

## 2019-04-17 DIAGNOSIS — J9622 Acute and chronic respiratory failure with hypercapnia: Secondary | ICD-10-CM | POA: Diagnosis not present

## 2019-04-17 DIAGNOSIS — I5033 Acute on chronic diastolic (congestive) heart failure: Secondary | ICD-10-CM | POA: Diagnosis not present

## 2019-04-17 DIAGNOSIS — D649 Anemia, unspecified: Secondary | ICD-10-CM

## 2019-04-17 DIAGNOSIS — T501X6A Underdosing of loop [high-ceiling] diuretics, initial encounter: Secondary | ICD-10-CM | POA: Diagnosis present

## 2019-04-17 DIAGNOSIS — R609 Edema, unspecified: Secondary | ICD-10-CM | POA: Diagnosis not present

## 2019-04-17 DIAGNOSIS — F17210 Nicotine dependence, cigarettes, uncomplicated: Secondary | ICD-10-CM | POA: Diagnosis present

## 2019-04-17 DIAGNOSIS — Z79899 Other long term (current) drug therapy: Secondary | ICD-10-CM

## 2019-04-17 DIAGNOSIS — Z7984 Long term (current) use of oral hypoglycemic drugs: Secondary | ICD-10-CM

## 2019-04-17 DIAGNOSIS — Z9861 Coronary angioplasty status: Secondary | ICD-10-CM | POA: Diagnosis not present

## 2019-04-17 DIAGNOSIS — I10 Essential (primary) hypertension: Secondary | ICD-10-CM | POA: Diagnosis not present

## 2019-04-17 DIAGNOSIS — I509 Heart failure, unspecified: Secondary | ICD-10-CM | POA: Diagnosis not present

## 2019-04-17 DIAGNOSIS — K08109 Complete loss of teeth, unspecified cause, unspecified class: Secondary | ICD-10-CM | POA: Diagnosis present

## 2019-04-17 DIAGNOSIS — T82855A Stenosis of coronary artery stent, initial encounter: Secondary | ICD-10-CM | POA: Diagnosis present

## 2019-04-17 DIAGNOSIS — J9601 Acute respiratory failure with hypoxia: Secondary | ICD-10-CM | POA: Diagnosis present

## 2019-04-17 DIAGNOSIS — R7303 Prediabetes: Secondary | ICD-10-CM | POA: Diagnosis present

## 2019-04-17 DIAGNOSIS — Z8249 Family history of ischemic heart disease and other diseases of the circulatory system: Secondary | ICD-10-CM

## 2019-04-17 DIAGNOSIS — K219 Gastro-esophageal reflux disease without esophagitis: Secondary | ICD-10-CM | POA: Diagnosis not present

## 2019-04-17 DIAGNOSIS — D696 Thrombocytopenia, unspecified: Secondary | ICD-10-CM

## 2019-04-17 DIAGNOSIS — Q688 Other specified congenital musculoskeletal deformities: Secondary | ICD-10-CM | POA: Diagnosis not present

## 2019-04-17 DIAGNOSIS — Y92009 Unspecified place in unspecified non-institutional (private) residence as the place of occurrence of the external cause: Secondary | ICD-10-CM | POA: Diagnosis not present

## 2019-04-17 DIAGNOSIS — I2089 Other forms of angina pectoris: Secondary | ICD-10-CM

## 2019-04-17 DIAGNOSIS — I208 Other forms of angina pectoris: Secondary | ICD-10-CM

## 2019-04-17 DIAGNOSIS — Z20822 Contact with and (suspected) exposure to covid-19: Secondary | ICD-10-CM | POA: Diagnosis not present

## 2019-04-17 DIAGNOSIS — M7989 Other specified soft tissue disorders: Secondary | ICD-10-CM | POA: Diagnosis not present

## 2019-04-17 DIAGNOSIS — D509 Iron deficiency anemia, unspecified: Secondary | ICD-10-CM | POA: Diagnosis present

## 2019-04-17 DIAGNOSIS — I25110 Atherosclerotic heart disease of native coronary artery with unstable angina pectoris: Secondary | ICD-10-CM | POA: Diagnosis not present

## 2019-04-17 DIAGNOSIS — E874 Mixed disorder of acid-base balance: Secondary | ICD-10-CM | POA: Diagnosis not present

## 2019-04-17 DIAGNOSIS — I110 Hypertensive heart disease with heart failure: Principal | ICD-10-CM | POA: Diagnosis present

## 2019-04-17 DIAGNOSIS — R0602 Shortness of breath: Secondary | ICD-10-CM | POA: Diagnosis not present

## 2019-04-17 DIAGNOSIS — R251 Tremor, unspecified: Secondary | ICD-10-CM | POA: Diagnosis not present

## 2019-04-17 DIAGNOSIS — J9621 Acute and chronic respiratory failure with hypoxia: Secondary | ICD-10-CM | POA: Diagnosis not present

## 2019-04-17 DIAGNOSIS — I5031 Acute diastolic (congestive) heart failure: Secondary | ICD-10-CM

## 2019-04-17 DIAGNOSIS — Z853 Personal history of malignant neoplasm of breast: Secondary | ICD-10-CM

## 2019-04-17 DIAGNOSIS — E785 Hyperlipidemia, unspecified: Secondary | ICD-10-CM | POA: Diagnosis present

## 2019-04-17 DIAGNOSIS — I2510 Atherosclerotic heart disease of native coronary artery without angina pectoris: Secondary | ICD-10-CM | POA: Diagnosis not present

## 2019-04-17 DIAGNOSIS — Z79811 Long term (current) use of aromatase inhibitors: Secondary | ICD-10-CM

## 2019-04-17 DIAGNOSIS — Z9181 History of falling: Secondary | ICD-10-CM

## 2019-04-17 DIAGNOSIS — Z825 Family history of asthma and other chronic lower respiratory diseases: Secondary | ICD-10-CM

## 2019-04-17 DIAGNOSIS — Y831 Surgical operation with implant of artificial internal device as the cause of abnormal reaction of the patient, or of later complication, without mention of misadventure at the time of the procedure: Secondary | ICD-10-CM | POA: Diagnosis present

## 2019-04-17 DIAGNOSIS — Z823 Family history of stroke: Secondary | ICD-10-CM

## 2019-04-17 DIAGNOSIS — E871 Hypo-osmolality and hyponatremia: Secondary | ICD-10-CM | POA: Diagnosis present

## 2019-04-17 DIAGNOSIS — I4891 Unspecified atrial fibrillation: Secondary | ICD-10-CM

## 2019-04-17 DIAGNOSIS — Z9114 Patient's other noncompliance with medication regimen: Secondary | ICD-10-CM

## 2019-04-17 DIAGNOSIS — Z955 Presence of coronary angioplasty implant and graft: Secondary | ICD-10-CM

## 2019-04-17 DIAGNOSIS — Z8673 Personal history of transient ischemic attack (TIA), and cerebral infarction without residual deficits: Secondary | ICD-10-CM

## 2019-04-17 LAB — BASIC METABOLIC PANEL
Anion gap: 12 (ref 5–15)
BUN: 16 mg/dL (ref 8–23)
CO2: 44 mmol/L — ABNORMAL HIGH (ref 22–32)
Calcium: 9.6 mg/dL (ref 8.9–10.3)
Chloride: 87 mmol/L — ABNORMAL LOW (ref 98–111)
Creatinine, Ser: 0.67 mg/dL (ref 0.44–1.00)
GFR calc Af Amer: >60 mL/min (ref >60)
GFR calc non Af Amer: >60 mL/min (ref >60)
Glucose, Bld: 128 mg/dL — ABNORMAL HIGH (ref 70–99)
Potassium: 3.7 mmol/L (ref 3.5–5.1)
Sodium: 143 mmol/L (ref 135–145)

## 2019-04-17 LAB — CBC
HCT: 31.3 % — ABNORMAL LOW (ref 36.0–46.0)
Hemoglobin: 9 g/dL — ABNORMAL LOW (ref 12.0–15.0)
MCH: 27.3 pg (ref 26.0–34.0)
MCHC: 28.8 g/dL — ABNORMAL LOW (ref 30.0–36.0)
MCV: 94.8 fL (ref 80.0–100.0)
Platelets: 149 10*3/uL — ABNORMAL LOW (ref 150–400)
RBC: 3.3 MIL/uL — ABNORMAL LOW (ref 3.87–5.11)
RDW: 18.9 % — ABNORMAL HIGH (ref 11.5–15.5)
WBC: 5.5 10*3/uL (ref 4.0–10.5)
nRBC: 0 % (ref 0.0–0.2)

## 2019-04-17 LAB — POCT I-STAT EG7
Acid-Base Excess: 24 mmol/L — ABNORMAL HIGH (ref 0.0–2.0)
Bicarbonate: 53.4 mmol/L — ABNORMAL HIGH (ref 20.0–28.0)
Calcium, Ion: 1.13 mmol/L — ABNORMAL LOW (ref 1.15–1.40)
HCT: 30 % — ABNORMAL LOW (ref 36.0–46.0)
Hemoglobin: 10.2 g/dL — ABNORMAL LOW (ref 12.0–15.0)
O2 Saturation: 99 %
Potassium: 3.8 mmol/L (ref 3.5–5.1)
Sodium: 139 mmol/L (ref 135–145)
TCO2: >50 mmol/L — ABNORMAL HIGH (ref 22–32)
pCO2, Ven: 92.2 mmHg (ref 44.0–60.0)
pH, Ven: 7.371 (ref 7.250–7.430)
pO2, Ven: 134 mmHg — ABNORMAL HIGH (ref 32.0–45.0)

## 2019-04-17 LAB — TROPONIN I (HIGH SENSITIVITY)
Troponin I (High Sensitivity): 14 ng/L (ref <18)
Troponin I (High Sensitivity): 14 ng/L (ref <18)

## 2019-04-17 LAB — RESPIRATORY PANEL BY RT PCR (FLU A&B, COVID)
Influenza A by PCR: NEGATIVE
Influenza B by PCR: NEGATIVE
SARS Coronavirus 2 by RT PCR: NEGATIVE

## 2019-04-17 LAB — BRAIN NATRIURETIC PEPTIDE: B Natriuretic Peptide: 187.1 pg/mL — ABNORMAL HIGH (ref 0.0–100.0)

## 2019-04-17 MED ORDER — VANCOMYCIN HCL 1250 MG/250ML IV SOLN
1250.0000 mg | Freq: Once | INTRAVENOUS | Status: AC
  Administered 2019-04-17: 1250 mg via INTRAVENOUS
  Filled 2019-04-17 (×2): qty 250

## 2019-04-17 MED ORDER — PIPERACILLIN-TAZOBACTAM 3.375 G IVPB 30 MIN
3.3750 g | Freq: Once | INTRAVENOUS | Status: AC
  Administered 2019-04-17: 3.375 g via INTRAVENOUS
  Filled 2019-04-17: qty 50

## 2019-04-17 MED ORDER — FUROSEMIDE 10 MG/ML IJ SOLN
40.0000 mg | Freq: Once | INTRAMUSCULAR | Status: AC
  Administered 2019-04-17: 40 mg via INTRAVENOUS
  Filled 2019-04-17: qty 4

## 2019-04-17 MED ORDER — SODIUM CHLORIDE 0.9% FLUSH
3.0000 mL | Freq: Once | INTRAVENOUS | Status: AC
  Administered 2019-04-23: 3 mL via INTRAVENOUS

## 2019-04-17 MED ORDER — ENOXAPARIN SODIUM 40 MG/0.4ML ~~LOC~~ SOLN
40.0000 mg | Freq: Every day | SUBCUTANEOUS | Status: DC
  Administered 2019-04-19: 40 mg via SUBCUTANEOUS
  Filled 2019-04-17 (×2): qty 0.4

## 2019-04-17 NOTE — ED Provider Notes (Signed)
[facility] EMERGENCY DEPARTMENT Provider Note   CSN: [id number] Arrival date & time: 04/17/19  1550     History Chief Complaint  Patient presents with  . Weakness    Patricia Wall is a 69 y.o. female.  69 yo F with a cc of fatigue.  Going on for 48 hours or so.  Family says she was just in the hospital for acute anemia.  Discharge on 15 January.  Unknown etiology for her blood loss..  Apparently the guaiac was negative.  She got 2 units of blood had some improvement and was discharged home.  Continue to have worsening fatigue.  She is on chronic 4 L of oxygen due to "end-stage COPD ".  She been complaining of some left-sided chest pain today as well.  Was sent home with a CPAP machine but the family is not sure if she is wearing it or not.  Has had some worsening lower extremity edema and missed her evening dose of Lasix.  Has had talks with palliative care but not yet made the decision to go with it.  Family is concerned that she may be anemic.  Denies any dark stool or blood in her stool.  Feel that colonoscopy or endoscopy would be too invasive.   The history is provided by the patient.  Weakness Severity:  Moderate Onset quality:  Gradual Duration:  2 days Timing:  Constant Progression:  Worsening Chronicity:  New Relieved by:  Nothing Worsened by:  Nothing Ineffective treatments:  None tried Associated symptoms: no arthralgias, no chest pain, no dizziness, no dysuria, no fever, no headaches, no myalgias, no nausea, no shortness of breath, no urgency and no vomiting        Past Medical History:  Diagnosis Date  . Abnormal chest xray   . Alcohol abuse   . Back pain   . CAD (coronary artery disease)     Prev seen by [facility].  Stent to OM and LAD Last cath 6/11 cathed on June 13, [name]. 2011.  The cardiac catheterization showed 30% and 50% lesions in the mid  and distal LAD, 50% in the OM1, 40% in the OM2 with 20% in-stent  restenosis, 40% in-stent  restenosis in the circumflex and no significant  disease in the RCA.  Her EF was normal.  Dr. Olevia Perches recommended a trial  of proton pump inhibitors   . Chest pain   . COPD (chronic obstructive pulmonary disease) ([place])   . Cyanosis   . DDD (degenerative disc disease)   . Depression   . Diarrhea   . Diverticular disease   . GERD (gastroesophageal reflux disease)   . H/O: hysterectomy   . Hemorrhoids   . HTN (hypertension)   . IBS (irritable bowel syndrome)   . Insomnia   . Lymphadenitis   . Pharyngitis   . PUD (peptic ulcer disease)   . PVD (peripheral vascular disease) ([place])   . Sinusitis   . TIA (transient ischemic attack)   . Tobacco abuse   . Vertigo     Patient Active Problem List   Diagnosis Date Noted  . Chronic respiratory failure with hypoxia and hypercapnia ([place]) 03/29/2018  . Acute respiratory failure with hypoxia ([place]) 01/07/2015  . CAD (coronary artery disease) 01/07/2015  . Obesity 11/01/2014  . COPD exacerbation ([place]) 12/30/2013  . Chest pain 12/03/2013  . Atherosclerotic heart disease of native coronary artery with angina pectoris ([place]) 12/03/2013    Class: Diagnosis of  . Syncope  12/02/2013  . Dyspnea 10/02/2013  . Acute upper respiratory infections of unspecified site 04/03/2013  . Right carotid bruit 11/15/2012  . Peripheral vascular disease ([place]) 09/30/2009  . Nonspecific (abnormal) findings on radiological and other examination of body structure 09/02/2009  . ABNORMAL CHEST XRAY 09/02/2009  . TINNITUS 04/15/2008  . COUGH 04/15/2008  . VERTIGO 02/04/2008  . PHARYNGITIS 07/18/2007  . LYMPHADENITIS, CERVICAL 07/18/2007  . Angina, class III ([place]) 03/29/2007  . CONDYLOMA ACUMINATUM 03/01/2007  . SINUSITIS, ACUTE 03/01/2007  . INSOMNIA 01/30/2007  . Elevated lipids 09/04/2006  . ABUSE, ALCOHOL, UNSPECIFIED 09/04/2006  . Cigarette smoker 09/04/2006  . DEPRESSION 09/04/2006  . Essential hypertension 09/04/2006  . CAD S/P percutaneous coronary  angioplasty: Prior Cypher DES to pLAD, pOM2 & AVG Cx (after Om2); New DES to AVG Cx ISR & Angiosculpt PTCA of Ostial AVG Cx from OM2. 09/04/2006  . HEMORRHOIDS 09/04/2006  . COPD GOLD II / still smoking  09/04/2006  . GERD 09/04/2006  . PEPTIC ULCER DISEASE 09/04/2006  . DIVERTICULOSIS, COLON 09/04/2006  . Irritable bowel syndrome 09/04/2006  . DEGENERATIVE DISC DISEASE, CERVICAL SPINE 09/04/2006  . LOW BACK PAIN 09/04/2006  . HEADACHE 09/04/2006  . History of cardiovascular disorder 09/04/2006  . ONYCHOMYCOSIS, TOENAILS 09/01/2006  . DIARRHEA 02/18/2006    Past Surgical History:  Procedure Laterality Date  . ABDOMINAL HYSTERECTOMY    . APPENDECTOMY    . BREAST LUMPECTOMY Left 2015  . CARDIAC CATHETERIZATION  2005, 2006, 2009,2011  . CHOLECYSTECTOMY    . CORONARY STENT PLACEMENT     Status post balloon angiogram plasty and Cypher stent to the distal    circumflex and OM2 branch  . Hammertoe repair    . Left Bunionectomy    . LEFT HEART CATHETERIZATION WITH CORONARY ANGIOGRAM N/A 12/03/2013   Procedure: LEFT HEART CATHETERIZATION WITH CORONARY ANGIOGRAM;  Surgeon: Leonie Man, MD;  Location[facility] CATH LAB;  Service: Cardiovascular;  Laterality: N/A;  . TUBAL LIGATION       OB History   No obstetric history on file.     Family History  Problem Relation Age of Onset  . Heart disease Mother   . Rheumatologic disease Mother   . Cancer - Other Mother        Uterine  . Emphysema Father   . Heart disease Father   . Cancer - Other Father   . Coronary artery disease Other   . Asthma Sister        2 sisters  . Stroke Sister     Social History   Tobacco Use  . Smoking status: Current Every Day Smoker    Packs/day: 2.00    Years: 52.00    Pack years: 104.00    Types: Cigarettes  . Smokeless tobacco: Never Used  . Tobacco comment: down to 0.5ppd   Substance Use Topics  . Alcohol use: No    Alcohol/week: 0.0 standard drinks  . Drug use: Yes    Comment: Seldom  Marijuana use- 1Xmonth    Home Medications Prior to Admission medications   Medication Sig Start Date End Date Taking? Authorizing Provider  acetaminophen-codeine (TYLENOL #3) 300-30 MG tablet TAKE 1 TABLET EVERY 4 HOURS AS NEEDED FOR COUGH 03/02/17   Tanda Rockers, MD  acidophilus (RISAQUAD) CAPS capsule Take 1 capsule by mouth 2 (two) times daily. 04/09/19   [provider]  albuterol (PROVENTIL) (2.5 MG/3ML) 0.083% nebulizer solution USE 1 VIAL IN NEBULIZER EVERY 4 HOURS AS NEEDED FOR WHEEZING  OR SHORTNESS OF BREATH. 07/14/17   Tanda Rockers, MD  albuterol (VENTOLIN HFA) 108 (90 Base) MCG/ACT inhaler Inhale 2 puffs into the lungs every 6 (six) hours as needed for wheezing or shortness of breath. 07/13/17   Tanda Rockers, MD  atorvastatin (LIPITOR) 80 MG tablet Take 80 mg by mouth daily.    [provider]  azithromycin (ZITHROMAX) 500 MG tablet Take 500 mg by mouth daily.    [provider]  blood glucose meter kit and supplies Dispense based on patient and insurance preference. Use up to four times daily as directed. (FOR ICD-9 250.00, 250.01). 01/13/15   Reyne Dumas, MD  budesonide-formoterol (SYMBICORT) 160-4.5 MCG/ACT inhaler Inhale 2 puffs into the lungs 2 (two) times daily. 10/26/16   Parrett, Fonnie Mu, NP  butalbital-acetaminophen-caffeine (FIORICET, ESGIC) 50-325-40 MG tablet 1 - 1 and 1/2 every 8 hours as needed for headache    [provider]  calcium carbonate (OS-CAL) 600 MG TABS Take 600 mg by mouth every morning.     [provider]  Cholecalciferol (VITAMIN D) 2000 UNITS tablet Take 2,000 Units by mouth every morning.     [provider]  Dextromethorphan-Guaifenesin ([place] FAST-MAX DM MAX) 5-100 MG/5ML LIQD 1 tsp every 4 hours as needed for cough/congestion    [provider]  Diclofenac Potassium (CAMBIA) 50 MG PACK Take at onset of headache as needed     [provider]  diltiazem (CARDIZEM CD) 120 MG  24 hr capsule Take 1 capsule by mouth daily. 04/05/19   [provider]  donepezil (ARICEPT) 5 MG tablet Take 1 tablet by mouth daily. 01/10/18   [provider]  DULoxetine (CYMBALTA) 60 MG capsule Take 60 mg by mouth every morning.     [provider]  Eduard Roux (AIMOVIG [place]) Inject into the skin as directed.    [provider]  furosemide (LASIX) 40 MG tablet Take 1 tablet by mouth 2 (two) times daily. 04/07/19   [provider]  gabapentin (NEURONTIN) 300 MG capsule Take 300 mg by mouth 3 (three) times daily.     [provider]  Lancets (FREESTYLE) lancets Use as instructed 01/13/15   Reyne Dumas, MD  letrozole [facility]) 2.5 MG tablet Take 2.5 mg by mouth every morning.     [provider]  losartan (COZAAR) 25 MG tablet Take 1 tablet by mouth daily. 04/05/19   [provider]  metFORMIN (GLUCOPHAGE) 1000 MG tablet Take 1,000 mg by mouth 2 (two) times daily with a meal.    [provider]  methocarbamol (ROBAXIN) 750 MG tablet Take 750 mg by mouth every 8 (eight) hours as needed for muscle spasms.    [provider]  metoprolol succinate (TOPROL-XL) 25 MG 24 hr tablet Take 1 tablet by mouth daily. 04/05/19   [provider]  Multiple Vitamin (MULTIVITAMIN) tablet Take 1 tablet by mouth every morning.     [provider]  nitroGLYCERIN (NITROSTAT) 0.4 MG SL tablet Place 1 tablet (0.4 mg total) under the tongue every 5 (five) minutes as needed for chest pain (may repeat x3). 02/02/18   Josue Hector, MD  omeprazole (PRILOSEC) 40 MG capsule Take 40 mg by mouth daily before breakfast.     [provider]  ondansetron (ZOFRAN) 8 MG tablet Take 8 mg by mouth every 4 (four) hours as needed for nausea.     [provider]  OXYGEN Use 4L 24/7    [provider]  potassium chloride SA (KLOR-CON M20) 20 MEQ tablet Take 1 tablet by mouth 2 (two) times daily. 04/05/19    [provider]  Respiratory Therapy Supplies (FLUTTER) DEVI Use as directed 07/13/17   Tanda Rockers, MD  Tiotropium Bromide Monohydrate (SPIRIVA RESPIMAT) 2.5 MCG/ACT AERS Inhale 2 puffs into the lungs daily. 10/26/16   Parrett, Fonnie Mu, NP    Allergies    Patient has no known allergies.  Review of Systems   Review of Systems  Constitutional: Negative for chills and fever.  HENT: Negative for congestion and rhinorrhea.   Eyes: Negative for redness and visual disturbance.  Respiratory: Negative for shortness of breath and wheezing.   Cardiovascular: Negative for chest pain and palpitations.  Gastrointestinal: Negative for nausea and vomiting.  Genitourinary: Negative for dysuria and urgency.  Musculoskeletal: Negative for arthralgias and myalgias.  Skin: Negative for pallor and wound.  Neurological: Positive for weakness. Negative for dizziness and headaches.    Physical Exam Updated Vital Signs BP (!) 140/51   Pulse 74   Temp 97.7 F (36.5 C) (Oral)   Resp (!) 26   SpO2 100%   Physical Exam Vitals and nursing note reviewed.  Constitutional:      General: She is not in acute distress.    Appearance: She is well-developed. She is not diaphoretic.  HENT:     Head: Normocephalic and atraumatic.  Eyes:     Pupils: Pupils are equal, round, and reactive to light.  Cardiovascular:     Rate and Rhythm: Normal rate and regular rhythm.     Heart sounds: No murmur. No friction rub. No gallop.   Pulmonary:     Effort: Pulmonary effort is normal.     Breath sounds: No wheezing or rales.  Abdominal:     General: There is no distension.     Palpations: Abdomen is soft.     Tenderness: There is no abdominal tenderness.  Musculoskeletal:        General: No tenderness.     Cervical back: Normal range of motion and neck supple.  Skin:    General: Skin is warm and dry.  Neurological:     Mental Status: She is alert.     Comments: Sleepy on exam.  Psychiatric:         Behavior: Behavior normal.     ED Results / Procedures / Treatments   Labs (all labs ordered are listed, but only abnormal results are displayed) Labs Reviewed  BASIC METABOLIC PANEL - Abnormal; Notable for the following components:      Result Value   Chloride 87 (*)    CO2 44 (*)    Glucose, Bld 128 (*)    All other components within normal limits  CBC - Abnormal; Notable for the following components:   RBC 3.30 (*)    Hemoglobin 9.0 (*)    HCT 31.3 (*)    MCHC 28.8 (*)    RDW 18.9 (*)    Platelets 149 (*)    All other components within normal limits  BRAIN NATRIURETIC PEPTIDE - Abnormal; Notable for the following components:   B Natriuretic Peptide 187.1 (*)    All other components within normal limits  POCT I-STAT EG7 - Abnormal; Notable for the following components:   pCO2, Ven 92.2 (*)    pO2, Ven 134.0 (*)    Bicarbonate 53.4 (*)    TCO2 >50 (*)    Acid-Base Excess 24.0 (*)    Calcium,  Ion 1.13 (*)    HCT 30.0 (*)    Hemoglobin 10.2 (*)    All other components within normal limits  RESPIRATORY PANEL BY RT PCR (FLU A&B, COVID)  CULTURE, BLOOD (ROUTINE X 2)  CULTURE, BLOOD (ROUTINE X 2)  BLOOD GAS, VENOUS  TROPONIN I (HIGH SENSITIVITY)  TROPONIN I (HIGH SENSITIVITY)    EKG EKG Interpretation  Date/Time:  Wednesday April 17 2019 16:10:33 EST Ventricular Rate:  103 PR Interval:  122 QRS Duration: 80 QT Interval:  334 QTC Calculation: 437 R Axis:   92 Text Interpretation: Sinus tachycardia Biatrial enlargement Rightward axis Pulmonary disease pattern Abnormal ECG Confirmed by Pattricia Boss [id number]) on 04/17/2019 6:17:53 PM   Radiology DG Chest 2 View  Result Date: 04/17/2019 CLINICAL DATA:  Weakness and chest pain. EXAM: CHEST - 2 VIEW COMPARISON:  April 04, 2019 FINDINGS: The lungs are hyperinflated. Very mild, hazy left basilar atelectasis and/or infiltrate is noted. This represents a new finding when compared to the prior study. There is no evidence  of a pleural effusion or pneumothorax. The heart size and mediastinal contours are within normal limits. There is moderate severity calcification of the aortic arch. The visualized skeletal structures are unremarkable. IMPRESSION: 1. New mild, hazy left basilar atelectasis and/or infiltrate. Electronically Signed   By: Virgina Norfolk M.D.   On: 04/17/2019 16:53    Procedures Procedures (including critical care time)  Medications Ordered in ED Medications  sodium chloride flush (NS) 0.9 % injection 3 mL (has no administration in time range)  vancomycin (VANCOREADY) IVPB 1250 mg/250 mL (1,250 mg Intravenous New Bag/Given 04/17/19 2217)  furosemide (LASIX) injection 40 mg (40 mg Intravenous Given 04/17/19 2114)  piperacillin-tazobactam (ZOSYN) IVPB 3.375 g (0 g Intravenous Stopped 04/17/19 2154)    ED Course  I have reviewed the triage vital signs and the nursing notes.  Pertinent labs & imaging results that were available during my care of the patient were reviewed by me and considered in my medical decision making (see chart for details).    MDM Rules/Calculators/A&P                      69 yo F with a cc of fatigue.  Going on for the past few days.  Family is concerned about her and brought her to the hospital.   Complaining of pain to the L chest.    Sleepy on my exam.  Patient with LLL infiltrate on cxr as viewed by me.  Concern for HCAP.  Will admit.   Patient's PCO2 was in the 90s.  With her altered mental status will place on BiPAP.  Unfortunately this is occurring during the novel coronavirus pandemic.  Some hesitation to start her on BiPAP until after having her Covid test back.  Rapid test is negative.  Placed on BiPAP.  Reassess patient having some improvement of mental status.  Mild tachypnea but no significant issues breathing.  Will discuss with medicine for admission.  CRITICAL CARE Performed by: Cecilio Asper   Total critical care time: 35 minutes  Critical  care time was exclusive of separately billable procedures and treating other patients.  Critical care was necessary to treat or prevent imminent or life-threatening deterioration.  Critical care was time spent personally by me on the following activities: development of treatment plan with patient and/or surrogate as well as nursing, discussions with consultants, evaluation of patient's response to treatment, examination of patient, obtaining history from patient or surrogate,  ordering and performing treatments and interventions, ordering and review of laboratory studies, ordering and review of radiographic studies, pulse oximetry and re-evaluation of patient's condition.  The patients results and plan were reviewed and discussed.   Any x-rays performed were independently reviewed by myself.   Differential diagnosis were considered with the presenting HPI.  Medications  sodium chloride flush (NS) 0.9 % injection 3 mL (has no administration in time range)  vancomycin (VANCOREADY) IVPB 1250 mg/250 mL (1,250 mg Intravenous New Bag/Given 04/17/19 2217)  furosemide (LASIX) injection 40 mg (40 mg Intravenous Given 04/17/19 2114)  piperacillin-tazobactam (ZOSYN) IVPB 3.375 g (0 g Intravenous Stopped 04/17/19 2154)    Vitals:   04/17/19 2139 04/17/19 2145 04/17/19 2230 04/17/19 2257  BP:      Pulse: 69 76 76 74  Resp: (!) 25 (!) 26 (!) 27 (!) 26  Temp:      TempSrc:      SpO2: 100% 100% 100% 100%    Final diagnoses:  HCAP (healthcare-associated pneumonia)  Acute on chronic respiratory failure with hypercapnia ([place])    Admission/ observation were discussed with the admitting physician, patient and/or family and they are comfortable with the plan.    Final Clinical Impression(s) / ED Diagnoses Final diagnoses:  HCAP (healthcare-associated pneumonia)  Acute on chronic respiratory failure with hypercapnia [facility])    Rx / DC Orders ED Discharge Orders    None       Deno Etienne,  DO 04/17/19 2320

## 2019-04-17 NOTE — ED Triage Notes (Signed)
Pt arrives via ems with c/c of weakness increasing since she was discharged with pneumonia. Pt ran out of home o2 so called ems for transport.

## 2019-04-17 NOTE — ED Notes (Signed)
Respiratory to set pt up on bipap shortly

## 2019-04-17 NOTE — ED Notes (Signed)
Pt to be placed on bipap once COVID test results. MD aware

## 2019-04-17 NOTE — Progress Notes (Deleted)
CARDIOLOGY OFFICE NOTE  Date:  04/17/2019    Patricia Wall Date of Birth: [birth year] Medical Record #[id number]  PCP:  Patricia Limbo, MD  Cardiologist:  Patricia Wall    No chief complaint on file.   History of Present Illness: Patricia Wall is a 69 y.o. female who presents today for a follow up visit.  Seen for Patricia Wall.   She has a history of known CAD with multiple stenting, PVD and progressive COPD on home supplemental O2, 4L with ongoing smoking use.   She had stenting to LAD, distal LCx and OM2 in 2005, in-stent restenosis with balloon in 2006, cath with patent stents in 2011. Had recurrent chest pain and underwent re-stenting to LCx in 2015. PVD hx includes 60-79%LICA stenosis that has not been checked since 2016.Echocardiogramfrom 2016 and EF 65-70% estimated PA 38 mmHg no valve disease.   Last seen by Patricia Wall in February of 2020. Saw Patricia Drown, NP back in August - intermittent chest pain with NTG use noted. She was not interested in further testing given her severe COPD. Also continues to smoke 2 to 3 cigarettes/day. She opted to maintain her current status.   I then saw her about 2 weeks ago - she had had recent admission at [facility] for respiratory failure/pneumonia/CHF. Noted spell of PAF and anticoagulation was deferred to [place]. She was markedly anemic - required transfusion. I got her records - reviewed with Patricia Wall and opted for NO anticoagulation/DAPT therapy in light on anemia. Echo with normal EF noted. She was still smoking. Balance poor with prior falls noted.   Social situation quite challenging - she lives with her husband who has had a stroke - daughter here in [place] taking care of her disabled husband. Other daughter with leukemia. She wished to remain a full code at our last visit.   The patient {does/does not:200015} have symptoms concerning for COVID-19 infection (fever, chills, cough, or new shortness of breath).   Comes  in today. Here with   Past Medical History:  Diagnosis Date  . Abnormal chest xray   . Alcohol abuse   . Back pain   . CAD (coronary artery disease)     Prev seen by [facility].  Stent to OM and LAD Last cath 6/11 cathed on June 13, Patricia Wall. 2011.  The cardiac catheterization showed 30% and 50% lesions in the mid  and distal LAD, 50% in the OM1, 40% in the OM2 with 20% in-stent  restenosis, 40% in-stent restenosis in the circumflex and no significant  disease in the RCA.  Her EF was normal.  Dr. Olevia Perches recommended a trial  of proton pump inhibitors   . Chest pain   . COPD (chronic obstructive pulmonary disease) ([place])   . Cyanosis   . DDD (degenerative disc disease)   . Depression   . Diarrhea   . Diverticular disease   . GERD (gastroesophageal reflux disease)   . H/O: hysterectomy   . Hemorrhoids   . HTN (hypertension)   . IBS (irritable bowel syndrome)   . Insomnia   . Lymphadenitis   . Pharyngitis   . PUD (peptic ulcer disease)   . PVD (peripheral vascular disease) ([place])   . Sinusitis   . TIA (transient ischemic attack)   . Tobacco abuse   . Vertigo     Past Surgical History:  Procedure Laterality Date  . ABDOMINAL HYSTERECTOMY    . APPENDECTOMY    . BREAST LUMPECTOMY  Left 2015  . CARDIAC CATHETERIZATION  2005, 2006, 2009,2011  . CHOLECYSTECTOMY    . CORONARY STENT PLACEMENT     Status post balloon angiogram plasty and Cypher stent to the distal    circumflex and OM2 branch  . Hammertoe repair    . Left Bunionectomy    . LEFT HEART CATHETERIZATION WITH CORONARY ANGIOGRAM N/A 12/03/2013   Procedure: LEFT HEART CATHETERIZATION WITH CORONARY ANGIOGRAM;  Surgeon: Patricia Lex, MD;  Location: [facility] CATH LAB;  Service: Cardiovascular;  Laterality: N/A;  . TUBAL LIGATION       Medications: No outpatient medications have been marked as taking for the 04/24/19 encounter (Appointment) with Patricia Macadamia, NP.     Allergies: No Known Allergies  Social History: The patient   reports that she has been smoking cigarettes. She has a 104.00 pack-year smoking history. She has never used smokeless tobacco. She reports current drug use. She reports that she does not drink alcohol.   Family History: The patient's ***family history includes Asthma in her sister; Cancer - Other in her father and mother; Coronary artery disease in an other family member; Emphysema in her father; Heart disease in her father and mother; Rheumatologic disease in her mother; Stroke in her sister.   Review of Systems: Please see the history of present illness.   All other systems are reviewed and negative.   Physical Exam: VS:  There were no vitals taken for this visit. [name]  BMI There is no height or weight on file to calculate BMI.  Wt Readings from Last 3 Encounters:  04/12/19 152 lb (68.9 kg)  01/11/19 147 lb (66.7 kg)  11/14/18 150 lb 6.4 oz (68.2 kg)    General: Pleasant. Well developed, well nourished and in no acute distress.   HEENT: Normal.  Neck: Supple, no JVD, carotid bruits, or masses noted.  Cardiac: ***Regular rate and rhythm. No murmurs, rubs, or gallops. No edema.  Respiratory:  Lungs are clear to auscultation bilaterally with normal work of breathing.  GI: Soft and nontender.  MS: No deformity or atrophy. Gait and ROM intact.  Skin: Warm and dry. Color is normal.  Neuro:  Strength and sensation are intact and no gross focal deficits noted.  Psych: Alert, appropriate and with normal affect.   LABORATORY DATA:  EKG:  EKG {ACTION; IS/IS [id number]:[id number]} ordered today. This demonstrates ***.  Lab Results  Component Value Date   WBC 7.4 04/15/2019   HGB 8.2 (L) 04/15/2019   HCT 27.5 (L) 04/15/2019   PLT 165 04/15/2019   GLUCOSE 206 (H) 04/15/2019   CHOL  08/29/2009    157        ATP III CLASSIFICATION:  <200     mg/dL   Desirable  371-696  mg/dL   Borderline High  >=789    mg/dL   High          TRIG 381 (H) 08/29/2009   HDL 31 (L) 08/29/2009   LDLCALC  08/29/2009     63        Total Cholesterol/HDL:CHD Risk Coronary Heart Disease Risk Table                     Men   Women  1/2 Average Risk   3.4   3.3  Average Risk       5.0   4.4  2 X Average Risk   9.6   7.1  3 X Average Risk  23.4  11.0        Use the calculated Patient Ratio above and the CHD Risk Table to determine the patient's CHD Risk.        ATP III CLASSIFICATION (LDL):  <100     mg/dL   Optimal  124-580  mg/dL   Near or Above                    Optimal  130-159  mg/dL   Borderline  998-338  mg/dL   High  >250     mg/dL   Very High   ALT 32 53/97/6734   AST 18 04/15/2019   NA 142 04/15/2019   K 4.5 04/15/2019   CL 90 (L) 04/15/2019   CREATININE 0.72 04/15/2019   BUN 20 04/15/2019   CO2 39 (H) 04/15/2019   TSH 0.533 01/08/2015   INR 1.0 12/02/2013   HGBA1C 7.7 (H) 01/08/2015     BNP (last 3 results) No results for input(s): BNP in the last 8760 hours.  ProBNP (last 3 results) Recent Labs    04/15/19 1225  PROBNP 711*     Other Studies Reviewed Today:  Echocardiogram 01/09/2015: Study Conclusions  - Left ventricle: The cavity size was normal. There was mild focal basal hypertrophy of the septum. Systolic function was vigorous. The estimated ejection fraction was in the range of 65% to 70%. Wall motion was normal; there were no regional wall motion abnormalities. Doppler parameters are consistent with abnormal left ventricular relaxation (grade 1 diastolic dysfunction). - Left atrium: The atrium was mildly dilated. - Right ventricle: The cavity size was mildly dilated. Wall thickness was normal. - Right atrium: The atrium was mildly dilated. - Pulmonary arteries: Systolic pressure was mildly increased. PA peak pressure: 38 mm Hg (S).  ASSESSMENT AND PLAN:  1. Recent admission - very unclear details - noted "CHF and PAF" - no records noted and not clear at all as to what has been done/needs to be done.   2. PAF - she is in sinus  today. She would be high risk for anticoagulation due to falls and we have unknown etiology of reason for transfusion.   3. CAD - multiple PCIs - noted that she was not previously interested in aggressive interventions - she is back on DAPT - this may need to be stopped.   4. Unexplained anemia - does not see GI - unclear what work up has been done - she seemed overwhelmed with the possibility of a GI work up - I think she would be at high risk for those procedures given her progressive COPD.   5. Carotid disease - she missed her recent visit for repeat doppler due to recent admission - this was not addressed.   6. Progressive COPD - on oxygen - very limited mobility - seems more end stage to me.   7. HLD - on statin - not discussed.   8. Reports of CHF - will arrange echo - she does not feel like this was [facility] at [facility]. Her lasix was already increased. Swelling reported improved. Lab today to include BNP.   9. Tobacco abuse - she is thinking of using patches.  [name] COVID-19 Education: The signs and symptoms of COVID-19 were discussed with the patient and how to seek care for testing (follow up with PCP or arrange E-visit).  The importance of social distancing, staying at home, hand hygiene and wearing a mask when out in public were discussed today.  Current medicines  are reviewed with the patient today.  The patient does not have concerns regarding medicines other than what has been noted above.  The following changes have been made:  See above.  Labs/ tests ordered today include:   No orders of the defined types were placed in this encounter.    Disposition:   FU with *** in {gen number 1-47:829562} {Days to years:10300}.   Patient is agreeable to this plan and will call if any problems develop in the interim.   SignedNorma Fredrickson, NP  04/17/2019 8:26 AM  [facility] [address] [place], [place]  [address] Phone: [phone] Fax: [phone]

## 2019-04-17 NOTE — ED Notes (Signed)
Pt had large bowel movement and assisted to the [facility]. Cleaned up pt and placed new brief. Pt currently being placed on bipap machine

## 2019-04-18 ENCOUNTER — Inpatient Hospital Stay (HOSPITAL_COMMUNITY): Payer: Medicare Other

## 2019-04-18 DIAGNOSIS — I480 Paroxysmal atrial fibrillation: Secondary | ICD-10-CM

## 2019-04-18 DIAGNOSIS — D649 Anemia, unspecified: Secondary | ICD-10-CM

## 2019-04-18 DIAGNOSIS — R609 Edema, unspecified: Secondary | ICD-10-CM

## 2019-04-18 DIAGNOSIS — M7989 Other specified soft tissue disorders: Secondary | ICD-10-CM

## 2019-04-18 DIAGNOSIS — D696 Thrombocytopenia, unspecified: Secondary | ICD-10-CM

## 2019-04-18 DIAGNOSIS — J441 Chronic obstructive pulmonary disease with (acute) exacerbation: Secondary | ICD-10-CM

## 2019-04-18 DIAGNOSIS — I509 Heart failure, unspecified: Secondary | ICD-10-CM

## 2019-04-18 LAB — BLOOD GAS, ARTERIAL
Acid-Base Excess: 18.4 mmol/L — ABNORMAL HIGH (ref 0.0–2.0)
Bicarbonate: 44 mmol/L — ABNORMAL HIGH (ref 20.0–28.0)
Drawn by: 137461
FIO2: 36
O2 Saturation: 99.4 %
Patient temperature: 37
pCO2 arterial: 67.1 mmHg (ref 32.0–48.0)
pH, Arterial: 7.433 (ref 7.350–7.450)
pO2, Arterial: 151 mmHg — ABNORMAL HIGH (ref 83.0–108.0)

## 2019-04-18 LAB — COMPREHENSIVE METABOLIC PANEL
ALT: 27 U/L (ref 0–44)
AST: 17 U/L (ref 15–41)
Albumin: 2.9 g/dL — ABNORMAL LOW (ref 3.5–5.0)
Alkaline Phosphatase: 81 U/L (ref 38–126)
Anion gap: 13 (ref 5–15)
BUN: 24 mg/dL — ABNORMAL HIGH (ref 8–23)
CO2: 42 mmol/L — ABNORMAL HIGH (ref 22–32)
Calcium: 8.8 mg/dL — ABNORMAL LOW (ref 8.9–10.3)
Chloride: 88 mmol/L — ABNORMAL LOW (ref 98–111)
Creatinine, Ser: 0.77 mg/dL (ref 0.44–1.00)
GFR calc Af Amer: >60 mL/min (ref >60)
GFR calc non Af Amer: >60 mL/min (ref >60)
Glucose, Bld: 183 mg/dL — ABNORMAL HIGH (ref 70–99)
Potassium: 4.2 mmol/L (ref 3.5–5.1)
Sodium: 143 mmol/L (ref 135–145)
Total Bilirubin: 0.9 mg/dL (ref 0.3–1.2)
Total Protein: 6 g/dL — ABNORMAL LOW (ref 6.5–8.1)

## 2019-04-18 LAB — BASIC METABOLIC PANEL
Anion gap: 10 (ref 5–15)
BUN: 18 mg/dL (ref 8–23)
CO2: 45 mmol/L — ABNORMAL HIGH (ref 22–32)
Calcium: 8.9 mg/dL (ref 8.9–10.3)
Chloride: 88 mmol/L — ABNORMAL LOW (ref 98–111)
Creatinine, Ser: 0.74 mg/dL (ref 0.44–1.00)
GFR calc Af Amer: >60 mL/min (ref >60)
GFR calc non Af Amer: >60 mL/min (ref >60)
Glucose, Bld: 118 mg/dL — ABNORMAL HIGH (ref 70–99)
Potassium: 3.8 mmol/L (ref 3.5–5.1)
Sodium: 143 mmol/L (ref 135–145)

## 2019-04-18 LAB — CBC
HCT: 29.5 % — ABNORMAL LOW (ref 36.0–46.0)
Hemoglobin: 8.4 g/dL — ABNORMAL LOW (ref 12.0–15.0)
MCH: 27.6 pg (ref 26.0–34.0)
MCHC: 28.5 g/dL — ABNORMAL LOW (ref 30.0–36.0)
MCV: 97 fL (ref 80.0–100.0)
Platelets: 124 10*3/uL — ABNORMAL LOW (ref 150–400)
RBC: 3.04 MIL/uL — ABNORMAL LOW (ref 3.87–5.11)
RDW: 19.4 % — ABNORMAL HIGH (ref 11.5–15.5)
WBC: 4.5 10*3/uL (ref 4.0–10.5)
nRBC: 0 % (ref 0.0–0.2)

## 2019-04-18 LAB — PROCALCITONIN: Procalcitonin: <0.1 ng/mL

## 2019-04-18 LAB — SARS CORONAVIRUS 2 (TAT 6-24 HRS): SARS Coronavirus 2: NEGATIVE

## 2019-04-18 LAB — HIV ANTIBODY (ROUTINE TESTING W REFLEX): HIV Screen 4th Generation wRfx: NONREACTIVE

## 2019-04-18 MED ORDER — SODIUM CHLORIDE 0.9 % IV SOLN
2.0000 g | Freq: Three times a day (TID) | INTRAVENOUS | Status: DC
  Administered 2019-04-18 – 2019-04-20 (×8): 2 g via INTRAVENOUS
  Filled 2019-04-18 (×12): qty 2

## 2019-04-18 MED ORDER — METHYLPREDNISOLONE SODIUM SUCC 40 MG IJ SOLR
40.0000 mg | Freq: Two times a day (BID) | INTRAMUSCULAR | Status: DC
  Administered 2019-04-18 – 2019-04-19 (×3): 40 mg via INTRAVENOUS
  Filled 2019-04-18 (×3): qty 1

## 2019-04-18 MED ORDER — FUROSEMIDE 10 MG/ML IJ SOLN
40.0000 mg | Freq: Every day | INTRAMUSCULAR | Status: DC
  Administered 2019-04-19: 40 mg via INTRAVENOUS
  Filled 2019-04-18: qty 4

## 2019-04-18 MED ORDER — METHOCARBAMOL 750 MG PO TABS: 750.0000 mg | ORAL_TABLET | Freq: Three times a day (TID) | ORAL | Status: DC | PRN

## 2019-04-18 MED ORDER — VANCOMYCIN HCL 750 MG/150ML IV SOLN
750.0000 mg | Freq: Two times a day (BID) | INTRAVENOUS | Status: DC
  Administered 2019-04-18 – 2019-04-20 (×5): 750 mg via INTRAVENOUS
  Filled 2019-04-18 (×8): qty 150

## 2019-04-18 MED ORDER — TRAMADOL HCL 50 MG PO TABS: 50.0000 mg | ORAL_TABLET | Freq: Four times a day (QID) | ORAL | Status: DC | PRN

## 2019-04-18 MED ORDER — FENTANYL CITRATE (PF) 100 MCG/2ML IJ SOLN
12.5000 ug | Freq: Once | INTRAMUSCULAR | Status: AC
  Administered 2019-04-18: 12.5 ug via INTRAVENOUS
  Filled 2019-04-18: qty 2

## 2019-04-18 MED ORDER — LEVALBUTEROL HCL 0.63 MG/3ML IN NEBU
0.6300 mg | INHALATION_SOLUTION | Freq: Four times a day (QID) | RESPIRATORY_TRACT | Status: DC
  Administered 2019-04-18: 0.63 mg via RESPIRATORY_TRACT
  Filled 2019-04-18: qty 3

## 2019-04-18 MED ORDER — IPRATROPIUM-ALBUTEROL 0.5-2.5 (3) MG/3ML IN SOLN
3.0000 mL | Freq: Four times a day (QID) | RESPIRATORY_TRACT | Status: DC
  Administered 2019-04-18 (×2): 3 mL via RESPIRATORY_TRACT
  Filled 2019-04-18 (×2): qty 3

## 2019-04-18 MED ORDER — METHYLPREDNISOLONE SODIUM SUCC 40 MG IJ SOLR
40.0000 mg | Freq: Every day | INTRAMUSCULAR | Status: DC
  Administered 2019-04-18: 40 mg via INTRAVENOUS
  Filled 2019-04-18: qty 1

## 2019-04-18 MED ORDER — IPRATROPIUM BROMIDE 0.02 % IN SOLN
0.5000 mg | Freq: Four times a day (QID) | RESPIRATORY_TRACT | Status: DC
  Administered 2019-04-18: 0.5 mg via RESPIRATORY_TRACT
  Filled 2019-04-18: qty 2.5

## 2019-04-18 NOTE — ED Notes (Signed)
Message sent to Pharmacy to request Vanco

## 2019-04-18 NOTE — ED Notes (Signed)
Able to speak with daughter. Room assignment given.

## 2019-04-18 NOTE — H&P (Addendum)
History and Physical    Patricia Wall CXK:[id number] DOB: [birth year] DOA: 04/17/2019  PCP: Bernerd Limbo, MD  Patient coming from: Home   I have personally briefly reviewed patient's old medical records in [place]  Chief Complaint: weakness   HPI: Patricia Wall is a 69 y.o. female with medical history significant of CAD with multiple stenting, PVD and progressive COPD on home supplemental O2, 4L with ongoing tobacco use who presented with weakness.  Pt unable to provide history due to confusion and no family at bedside. I was unable to reach daughter by phone after multiple attempts.  History obtained entirely from ED physician report and chart review.   Reportedly patient brought in for worsening fatigue over the past 48 hrs and ran out of home O2. She complaints of left sided chest pain that is reproducible on exam but otherwise says "I don't know" to all other questions.   Per cardiology documentation on 1/25, She was admitted to [facility] earlier this month and discharged on 1/15 and was there about 10 days. Found to have CHF and PAF. Placed on metoprolol and Cardizem. Also found to be anemic and was transfused 2 units of pRBC, reportedly heme negative but did not have any further work up. Decision to start anticoagulation was deferred to cardiology and ultimately she was discharged only on Plavix and aspirin.   She had follow up with PCP on 1/21 and Lasix was increased from 63m daily to BID due to increase peripheral edema.   It appears on 1/26 daughter had discuss with cardiology to hold aspirin and Plavix and wanted to discuss GI referral and possible hospice service with PCP.   ED Course: She was afebrile, had borderline BP of 101/47 and was tachypneic and hypercarbic requiring Bipap.  She had no leukocytosis, hemoglobin stable at 9 with chronic anemia, thrombocytopenia 149.  No significant findings on BMP. BNP of 187, troponin flat at 17.  Chest shows new mild hazy  left basilar atelectasis and or infiltrate.  She was given 460mLasix, Zosyn and vancomycin in the ED.   Review of Systems:  Unable to obtain due to AMS  Past Medical History:  Diagnosis Date  . Abnormal chest xray   . Alcohol abuse   . Back pain   . CAD (coronary artery disease)     [facility] OM and LAD Last cath 6/11 cathed on June 13, [name]. 2011.  The cardiac catheterization showed 30% and 50% lesions in the mid  and distal LAD, 50% in the OM1, 40% in the OM2 with 20% in-stent  restenosis, 40% in-stent restenosis in the circumflex and no significant  disease in the RCA.  Her EF was normal.  Dr. BrOlevia Perchesecommended a trial  of proton pump inhibitors   . Chest pain   . COPD (chronic obstructive pulmonary disease) ([place]  . Cyanosis   . DDD (degenerative disc disease)   . Depression   . Diarrhea   . Diverticular disease   . GERD (gastroesophageal reflux disease)   . H/O: hysterectomy   . Hemorrhoids   . HTN (hypertension)   . IBS (irritable bowel syndrome)   . Insomnia   . Lymphadenitis   . Pharyngitis   . PUD (peptic ulcer disease)   . PVD [place]peripheral vascular disease) ([place]  . Sinusitis   . TIA (transient ischemic attack)   . Tobacco abuse   . Vertigo     Past Surgical  History:  Procedure Laterality Date  . ABDOMINAL HYSTERECTOMY    . APPENDECTOMY    . BREAST LUMPECTOMY Left 2015  . CARDIAC CATHETERIZATION  2005, 2006, 2009,2011  . CHOLECYSTECTOMY    . CORONARY STENT PLACEMENT     Status post balloon angiogram plasty and Cypher stent to the distal    circumflex and OM2 branch  . Hammertoe repair    . Left Bunionectomy    . LEFT HEART CATHETERIZATION WITH CORONARY ANGIOGRAM N/A 12/03/2013   Procedure: LEFT HEART CATHETERIZATION WITH CORONARY ANGIOGRAM;  Surgeon: Leonie Man, MD;  Location: [facility] CATH LAB;  Service: Cardiovascular;  Laterality: N/A;  . TUBAL LIGATION       reports that she has been smoking cigarettes. She has a 104.00  pack-year smoking history. She has never used smokeless tobacco. She reports current drug use. She reports that she does not drink alcohol.  No Known Allergies  Family History  Problem Relation Age of Onset  . Heart disease Mother   . Rheumatologic disease Mother   . Cancer - Other Mother        Uterine  . Emphysema Father   . Heart disease Father   . Cancer - Other Father   . Coronary artery disease Other   . Asthma Sister        2 sisters  . Stroke Sister      Prior to Admission medications   Medication Sig Start Date End Date Taking? Authorizing Provider  acetaminophen-codeine (TYLENOL #3) 300-30 MG tablet TAKE 1 TABLET EVERY 4 HOURS AS NEEDED FOR COUGH 03/02/17   Tanda Rockers, MD  acidophilus (RISAQUAD) CAPS capsule Take 1 capsule by mouth 2 (two) times daily. 04/09/19   [provider]  albuterol (PROVENTIL) (2.5 MG/3ML) 0.083% nebulizer solution USE 1 VIAL IN NEBULIZER EVERY 4 HOURS AS NEEDED FOR WHEEZING OR SHORTNESS OF BREATH. 07/14/17   Tanda Rockers, MD  albuterol (VENTOLIN HFA) 108 (90 Base) MCG/ACT inhaler Inhale 2 puffs into the lungs every 6 (six) hours as needed for wheezing or shortness of breath. 07/13/17   Tanda Rockers, MD  atorvastatin (LIPITOR) 80 MG tablet Take 80 mg by mouth daily.    [provider]  azithromycin (ZITHROMAX) 500 MG tablet Take 500 mg by mouth daily.    [provider]  blood glucose meter kit and supplies Dispense based on patient and insurance preference. Use up to four times daily as directed. (FOR ICD-9 250.00, 250.01). 01/13/15   Reyne Dumas, MD  budesonide-formoterol (SYMBICORT) 160-4.5 MCG/ACT inhaler Inhale 2 puffs into the lungs 2 (two) times daily. 10/26/16   Parrett, Fonnie Mu, NP  butalbital-acetaminophen-caffeine (FIORICET, ESGIC) 50-325-40 MG tablet 1 - 1 and 1/2 every 8 hours as needed for headache    [provider]  calcium carbonate (OS-CAL) 600 MG TABS Take 600 mg by mouth every morning.      [provider]  Cholecalciferol (VITAMIN D) 2000 UNITS tablet Take 2,000 Units by mouth every morning.     [provider]  Dextromethorphan-Guaifenesin ([place] FAST-MAX DM MAX) 5-100 MG/5ML LIQD 1 tsp every 4 hours as needed for cough/congestion    [provider]  Diclofenac Potassium (CAMBIA) 50 MG PACK Take at onset of headache as needed     [provider]  diltiazem (CARDIZEM CD) 120 MG 24 hr capsule Take 1 capsule by mouth daily. 04/05/19   [provider]  donepezil (ARICEPT) 5 MG tablet Take 1 tablet by mouth  daily. 01/10/18   [provider]  DULoxetine (CYMBALTA) 60 MG capsule Take 60 mg by mouth every morning.     [provider]  Eduard Roux (AIMOVIG [place]) Inject into the skin as directed.    [provider]  furosemide (LASIX) 40 MG tablet Take 1 tablet by mouth 2 (two) times daily. 04/07/19   [provider]  gabapentin (NEURONTIN) 300 MG capsule Take 300 mg by mouth 3 (three) times daily.     [provider]  Lancets (FREESTYLE) lancets Use as instructed 01/13/15   Reyne Dumas, MD  letrozole [facility]) 2.5 MG tablet Take 2.5 mg by mouth every morning.     [provider]  losartan (COZAAR) 25 MG tablet Take 1 tablet by mouth daily. 04/05/19   [provider]  metFORMIN (GLUCOPHAGE) 1000 MG tablet Take 1,000 mg by mouth 2 (two) times daily with a meal.    [provider]  methocarbamol (ROBAXIN) 750 MG tablet Take 750 mg by mouth every 8 (eight) hours as needed for muscle spasms.    [provider]  metoprolol succinate (TOPROL-XL) 25 MG 24 hr tablet Take 1 tablet by mouth daily. 04/05/19   [provider]  Multiple Vitamin (MULTIVITAMIN) tablet Take 1 tablet by mouth every morning.     [provider]  nitroGLYCERIN (NITROSTAT) 0.4 MG SL tablet Place 1 tablet (0.4 mg total) under the tongue every 5 (five) minutes as needed for chest pain (may  repeat x3). 02/02/18   Josue Hector, MD  omeprazole (PRILOSEC) 40 MG capsule Take 40 mg by mouth daily before breakfast.     [provider]  ondansetron (ZOFRAN) 8 MG tablet Take 8 mg by mouth every 4 (four) hours as needed for nausea.     [provider]  OXYGEN Use 4L 24/7    [provider]  potassium chloride SA (KLOR-CON M20) 20 MEQ tablet Take 1 tablet by mouth 2 (two) times daily. 04/05/19   [provider]  Respiratory Therapy Supplies (FLUTTER) DEVI Use as directed 07/13/17   Tanda Rockers, MD  Tiotropium Bromide Monohydrate (SPIRIVA RESPIMAT) 2.5 MCG/ACT AERS Inhale 2 puffs into the lungs daily. 10/26/16   Melvenia Needles, NP    Physical Exam: Vitals:   04/17/19 2321 04/17/19 2330 04/17/19 2357 04/18/19 0000  BP: (!) 101/47     Pulse: 65 69 70 60  Resp: (!) 25 (!) _0 Temp:      TempSrc:      SpO2: 100% 100% 100% 100%    Constitutional: ill appearing female laying flat in bed on Bipap Vitals:   04/17/19 2321 04/17/19 2330 04/17/19 2357 04/18/19 0000  BP: (!) 101/47     Pulse: 65 69 70 60  Resp: (!) 25 (!) _1 Temp:      TempSrc:      SpO2: 100% 100% 100% 100%   Eyes: PERRL, lids and conjunctivae normal ENMT: Mucous membranes are moist.  Neck: normal, supple Respiratory: difficult lung exam given pt is on Bipap but no obvious wheezing, no crackles. Normal respiratory effort.   Cardiovascular: Regular rate and rhythm, no murmurs / rubs / gallops. Non-pitting edema of bilateral feet.  Abdomen:pain with palpation of left lower chest on ribs,  no masses palpated.  Bowel sounds positive.  Musculoskeletal: no clubbing / cyanosis. No obvious deformities. Left LE appears circumferential larger than right LE.  Skin: no rashes, lesions, ulcers. No induration Neurologic: Awoke  easily to voice. Able to point out her chest pain and follow commands with hand grip and lifting her LE. However repeatedly says "I don't know to any  questions. Could not say her name, location or time.  Psychiatric: Alert but disoriented.     Labs on Admission: I have personally reviewed following labs and imaging studies  CBC: Recent Labs  Lab 04/15/19 1225 04/17/19 1620 04/17/19 2113  WBC 7.4 5.5  --   HGB 8.2* 9.0* 10.2*  HCT 27.5* 31.3* 30.0*  MCV 91 94.8  --   PLT 165 149*  --    Basic Metabolic Panel: Recent Labs  Lab 04/15/19 1225 04/17/19 1620 04/17/19 2113  NA 142 143 139  K 4.5 3.7 3.8  CL 90* 87*  --   CO2 39* 44*  --   GLUCOSE 206* 128*  --   BUN 20 16  --   CREATININE 0.72 0.67  --   CALCIUM 9.4 9.6  --    GFR: Estimated Creatinine Clearance: 61.3 mL/min (by C-G formula based on SCr of 0.67 mg/dL). Liver Function Tests: Recent Labs  Lab 04/15/19 1225  AST 18  ALT 32  ALKPHOS 82  BILITOT 0.4  PROT 5.5*  ALBUMIN 3.4*   No results for input(s): LIPASE, AMYLASE in the last 168 hours. No results for input(s): AMMONIA in the last 168 hours. Coagulation Profile: No results for input(s): INR, PROTIME in the last 168 hours. Cardiac Enzymes: No results for input(s): CKTOTAL, CKMB, CKMBINDEX, TROPONINI in the last 168 hours. BNP (last 3 results) Recent Labs    04/15/19 1225  PROBNP 711*   HbA1C: No results for input(s): HGBA1C in the last 72 hours. CBG: No results for input(s): GLUCAP in the last 168 hours. Lipid Profile: No results for input(s): CHOL, HDL, LDLCALC, TRIG, CHOLHDL, LDLDIRECT in the last 72 hours. Thyroid Function Tests: No results for input(s): TSH, T4TOTAL, FREET4, T3FREE, THYROIDAB in the last 72 hours. Anemia Panel: No results for input(s): VITAMINB12, FOLATE, FERRITIN, TIBC, IRON, RETICCTPCT in the last 72 hours. Urine analysis:    Component Value Date/Time   COLORURINE AMBER BIOCHEMICALS MAY BE AFFECTED BY COLOR (A) 10/10/2009 2052   APPEARANCEUR CLEAR 10/10/2009 2052   LABSPEC 1.022 10/10/2009 2052   PHURINE 6.0 10/10/2009 2052   GLUCOSEU NEGATIVE 10/10/2009 2052     HGBUR NEGATIVE 10/10/2009 2052   BILIRUBINUR SMALL (A) 10/10/2009 2052   KETONESUR 15 (A) 10/10/2009 2052   PROTEINUR NEGATIVE 10/10/2009 2052   UROBILINOGEN 1.0 10/10/2009 2052   NITRITE NEGATIVE 10/10/2009 2052   LEUKOCYTESUR SMALL (A) 10/10/2009 2052    Radiological Exams on Admission: DG Chest 2 View  Result Date: 04/17/2019 CLINICAL DATA:  Weakness and chest pain. EXAM: CHEST - 2 VIEW COMPARISON:  April 04, 2019 FINDINGS: The lungs are hyperinflated. Very mild, hazy left basilar atelectasis and/or infiltrate is noted. This represents a new finding when compared to the prior study. There is no evidence of a pleural effusion or pneumothorax. The heart size and mediastinal contours are within normal limits. There is moderate severity calcification of the aortic arch. The visualized skeletal structures are unremarkable. IMPRESSION: 1. New mild, hazy left basilar atelectasis and/or infiltrate. Electronically Signed   By: Virgina Norfolk M.D.   On: 04/17/2019 16:53    EKG: Independently reviewed.   Assessment/Plan  Acute hypoxic and hypercarbic respiratory failure requiring Bipap secondary to HCAP vs COPD exacerbation vs CHF exacerbation Baseline at 4L  No leukocytosis or fever. Admit as PUI  for COVID Started on vancomycin and Zosyn in ED- will continue vancomycin and switch to Cefepime. Obtain PCT. Duonebs q6hr  will trial steroids and evaluate for any improvement Unfortunately do not have records of her latest echo results from recent admission. Does not appear hypervolemic. Will continue IV 43m daily and monitor Is and Os. Daily weights.   Left LE asymmetry Left lower extremity appears circumferentially larger than right.  Will obtain left DVT venous Doppler ultrasound.  CAD s/p multiple stents  decision made by family with cardiology on 1/26 to hold aspirin and plavix due to recent acute on chronic anemia  Chronic anemia Stable.  Thrombocytopenia  Unclear etiology. No  signs of active bleed. Continue to monitor.   Paroxysmal atrial fibrillation Not on anticoagulation.  Rate controlled. Hold metoprolol, diltiazem and losartan for borderline blood pressure  Medication reconciliation incomplete at the time of admission due to patient's altered mental status and no family could be reached.  Will need follow-up.    DVT prophylaxis:.Lovenox Code Status: Full Family Communication: No family at bedside and unable to reach by phone disposition Plan: Home with at least 2 midnight stays  Consults called:  Admission status: inpatient   T  DO [facility]   If 7PM-7AM, please contact night-coverage [email or web address]   04/18/2019, 12:26 AM

## 2019-04-18 NOTE — ED Notes (Signed)
Vascular tech at bedside. °

## 2019-04-18 NOTE — Progress Notes (Signed)
PROGRESS NOTE    Patricia Wall  UKG:[id number] DOB: 03-Nov-1950 DOA: 04/17/2019 PCP: Tracey Harries, MD  Brief Narrative:  HPI per Dr. Benita Gutter on 04/17/2019 Patricia Wall is a 69 y.o. female with medical history significant of CAD withmultiple stenting, PVD andprogressiveCOPD on home supplemental O2, 4Lwith ongoing tobacco use who presented with weakness.  Pt unable to provide history due to confusion and no family at bedside. I was unable to reach daughter by phone after multiple attempts.  History obtained entirely from ED physician report and chart review.   Reportedly patient brought in for worsening fatigue over the past 48 hrs and ran out of home O2. She complaints of left sided chest pain that is reproducible on exam but otherwise says "I don't know" to all other questions.   Per cardiology documentation on 1/25, She was admitted to [facility] earlier this month and discharged on 1/15 and was there about 10 days. Found to have CHF andPAF.Placed on metoprolol and Cardizem. Also found to be anemic and was transfused 2 units of pRBC, reportedly heme negative but did not have any further work up. Decision to start anticoagulation was deferred to cardiology and ultimately she was discharged only on Plavix and aspirin.  She had follow up with PCP on 1/21 and Lasix was increased from 40mg  daily to BID due to increase peripheral edema.   It appears on 1/26 daughter had discuss with cardiology to hold aspirin and Plavix and wanted to discuss GI referral and possible hospice service with PCP.   ED Course: She was afebrile, had borderline BP of 101/47 and was tachypneic and hypercarbic requiring Bipap.  She had no leukocytosis, hemoglobin stable at 9 with chronic anemia, thrombocytopenia 149.  No significant findings on BMP. BNP of 187, troponin flat at 17.  Chest shows new mild hazy left basilar atelectasis and or infiltrate.  She was given 40mg  Lasix, Zosyn and vancomycin in the  ED.   **Interim History Pain on the BiPAP and was complaining of shortness of breath.  Will start diuresis and continue steroids and breathing medications have been adjusted.  Remains a little bit hypercarbic.  Will obtain SLP evaluation to ensure that she can safely swallow given her AMS.  Will obtain a TSH, RPR, B12 level and consider head imaging if not improving.  Assessment & Plan:   Active Problems:   CAD S/P percutaneous coronary angioplasty: Prior Cypher DES to pLAD, pOM2 & AVG Cx (after Om2); New DES to AVG Cx ISR & Angiosculpt PTCA of Ostial AVG Cx from OM2.   Acute respiratory failure with hypoxia (HCC)   Anemia   Thrombocytopenia (HCC)   AF (paroxysmal atrial fibrillation) (HCC)  Acute on Chronic Respiratory Failure with Hypoxia and Hypercarbia requiring noninvasive positive pressure ventilation with BiPAP -Secondary to HCAP vs COPD exacerbation vs CHF exacerbation and likely a combination of all -Baseline at 4L  -ABG done and showed a pH of 7.433, PCO2 of 67.1, and PO2 of 131, bicarbonate level of 44.0, and an ABG O2 saturation of 99.4% -No leukocytosis or fever. Admit as PUI for COVID but this was negative so transferred to medical floor -BNP on admission was 187.1 -Started on vancomycin and Zosyn in ED- will continue vancomycin and switch to Cefepime. -Obtained PCT was <0.10 -Duonebs q6hr changed to Xopenex/Atrovent for Improvement   -will trial steroids and evaluate for any improvement Unfortunately do not have records of her latest echo results from recent admission so will repeat -Appears somewhat volume  overloaded -Will continue IV 40mg  daily and monitor Is and Os. Daily weights.   -Repeat chest x-ray in a.m. -When she is awake enough she will need smoking cessation counseling and nicotine patch -Patient remains hypercarbic and last ABG showed a PCO2 of 67 next-continue BiPAP and repeat ABG in 1 hour 2 hours after remaining on BiPAP -Repeat echocardiogram as above    -Chest x-ray did show a left upper lobe "New mild, hazy left basilar atelectasis and/or infiltrate." -Continue supplemental oxygen and wean off of BiPAP to nasal cannula to her home 4 L -Continuous pulse oximetry and maintain O2 saturations greater than 90%-we will need ambulatory home O2 screen prior to discharge  Acute Encephalopathy likely in the setting of hypercarbia rule out other etiologies -Remains somewhat confused but does have donepezil on her MAR and question if she has a history of dementia -Check TSH -Correct hypercarbia and metabolic issues if still not improving may consider further imaging with EEG and head CT and possible MRI -Obtain SLP evaluation -Treat infectious causes with pneumonia and continue antibiotics for now  Left LE asymmetry -Left lower extremity appears circumferentially larger than right.   -Will obtain left DVT venous Doppler ultrasound and did not show DVT  CAD s/p multiple stents  -Decision made by family with cardiology on 1/26 to hold aspirin and plavix due to recent acute on chronic anemia -Currently holding her metoprolol and losartan for her borderline blood pressure and n.p.o. status given her confusion -Resume her atorvastatin milligrams p.o. daily once she is safe enough to swallow along with her losartan 25 mg daily and metoprolol succinate 25 mg p.o. daily -Denied any chest pain on my examination but did state that she was short of breath -repeat echocardiogram as below  Acute COPD exacerbation -As above -Patient takes albuterol inhalers as well as Symbicort and albuterol nebulizers in the outpatient setting as well as to Spiriva -Continue with steroids, nebs, supplemental oxygen, as well as antibiotics as above  Acute on chronic CHF -As above -She takes furosemide 40 mg p.o. twice daily at home -Also takes losartan 25 mg p.o. daily, Fraller tartrate 25 mg p.o. daily -Strict I's and O's and daily weights -Continue to monitor for signs  and symptoms of volume overload and repeat echocardiogram currently -Repeat chest x-ray in a.m.  GERD -Takes omeprazole 40 mg p.o. daily before breakfast and will resume once able to swallow  History of Breast Cancer -She is status post chemotherapy and radiation -Currently on letrozole  Normocytic anemia  -Appears chronic in nature -Patient hemoglobin/hematocrit on admission was 10.2/30.0 and then dropped to 8.4/29.5 -Check anemia panel in a.m. -Continue to monitor for signs and symptoms of bleeding; currently no overt bleeding noted -Repeat CBC in a.m.  -A decision was made with the patient and the patient's cardiologist to hold her aspirin and Plavix due to recent acute on chronic anemia  Thrombocytopenia  -Unclear etiology but could be infectious in the setting of her pneumonia.  -Platelet count went from 149,000 down to 124,000 however on 25 January is 165,000 -No signs of active bleed. Continue to monitor for signs and symptoms of bleeding -Repeat CBC in the a.m.  Paroxysmal atrial fibrillation -Not on anticoagulation.  Rate controlled. -Currently hold metoprolol, diltiazem for now until she can safely swallow -Continue with at least telemetry monitoring -Resume home meds when safe to swallow and cleared by SLP  DVT prophylaxis: SCDs and enoxaparin 40 mg subcu daily Code Status: FULL CODE  Family Communication: Discussed  with Daughter over the phone Orlie Pollen) Disposition Plan: Remain Inpatient for continued workup and improvement of Respiratory Status and Euvolemic State. Will need PT/OT to evaluate and treat  Consultants:   None   Procedures:  ECHOCARDIOGRAM ordered and pending to be done   Antimicrobials:  Anti-infectives (From admission, onward)   Start     Dose/Rate Route Frequency Ordered Stop   04/18/19 1000  vancomycin (VANCOREADY) IVPB 750 mg/150 mL     750 mg 150 mL/hr over 60 Minutes Intravenous Every 12 hours 04/18/19 0140     04/18/19 0600  ceFEPIme  (MAXIPIME) 2 g in sodium chloride 0.9 % 100 mL IVPB     2 g 200 mL/hr over 30 Minutes Intravenous Every 8 hours 04/18/19 0140     04/17/19 2045  vancomycin (VANCOREADY) IVPB 1250 mg/250 mL     1,250 mg 166.7 mL/hr over 90 Minutes Intravenous  Once 04/17/19 2040 04/18/19 0008   04/17/19 2045  piperacillin-tazobactam (ZOSYN) IVPB 3.375 g     3.375 g 100 mL/hr over 30 Minutes Intravenous  Once 04/17/19 2040 04/17/19 2154     Subjective: Seen and examined bedside she is on the BiPAP and complaining of some shortness of breath.  No nausea or vomiting.  Still little confused.  Has some pain in her legs when palpated.  Daughter states that she still smokes on 4 L of oxygen.  No other concerns or complaints at this time.  Objective: Vitals:   04/18/19 1400 04/18/19 1415 04/18/19 1500 04/18/19 1600  BP: 131/67 140/69 133/66 (!) 107/59  Pulse: (!) 107 (!) 107 (!) 109 (!) 106  Resp: (!) 21 19 14 12   Temp:    98.9 F (37.2 C)  TempSrc:    Oral  SpO2: 100% 100% 100% 100%    Intake/Output Summary (Last 24 hours) at 04/18/2019 1742 Last data filed at 04/18/2019 1421 Gross per 24 hour  Intake 100 ml  Output 800 ml  Net -700 ml   There were no vitals filed for this visit.  Examination: Physical Exam:  Constitutional: WN/WD Caucasian female in mild respiratory distress appears slightly uncomfortable on the BiPAP Eyes: Lids and conjunctivae normal, sclerae anicteric  ENMT: External Ears, Nose appear normal. Grossly normal hearing. Mucous membranes are moist. .  Neck: Appears normal, supple, no cervical masses, normal ROM, no appreciable thyromegaly no appreciable JVD but is on the BiPAP Respiratory: Diminished to auscultation bilaterally with coarse breath sounds and some crackles and mild wheezing; increased respiratory effort and she is wearing a BiPAP  Cardiovascular: RRR, is 1+ lower extremity edema Abdomen: Soft, non-tender, mildly distended. Bowel sounds positive x4.  GU:  Deferred. Musculoskeletal: No clubbing / cyanosis of digits/nails. No joint deformity upper and lower extremities.  Skin: No rashes, lesions, ulcers on a limited skin evaluation. No induration; Warm and dry.  Neurologic: CN 2-12 grossly intact with no focal deficits. Romberg sign and cerebellar reflexes not assessed.  Psychiatric: Impaired judgment and insight. Alert and oriented x1. Normal mood and appropriate affect.   Data Reviewed: I have personally reviewed following labs and imaging studies  CBC: Recent Labs  Lab 04/15/19 1225 04/17/19 1620 04/17/19 2113 04/18/19 0608  WBC 7.4 5.5  --  4.5  HGB 8.2* 9.0* 10.2* 8.4*  HCT 27.5* 31.3* 30.0* 29.5*  MCV 91 94.8  --  97.0  PLT 165 149*  --  124*   Basic Metabolic Panel: Recent Labs  Lab 04/15/19 1225 04/17/19 1620 04/17/19 2113 04/18/19 0608  NA  142 143 139 143  K 4.5 3.7 3.8 3.8  CL 90* 87*  --  88*  CO2 39* 44*  --  45*  GLUCOSE 206* 128*  --  118*  BUN 20 16  --  18  CREATININE 0.72 0.67  --  0.74  CALCIUM 9.4 9.6  --  8.9   GFR: Estimated Creatinine Clearance: 61.3 mL/min (by C-G formula based on SCr of 0.74 mg/dL). Liver Function Tests: Recent Labs  Lab 04/15/19 1225  AST 18  ALT 32  ALKPHOS 82  BILITOT 0.4  PROT 5.5*  ALBUMIN 3.4*   No results for input(s): LIPASE, AMYLASE in the last 168 hours. No results for input(s): AMMONIA in the last 168 hours. Coagulation Profile: No results for input(s): INR, PROTIME in the last 168 hours. Cardiac Enzymes: No results for input(s): CKTOTAL, CKMB, CKMBINDEX, TROPONINI in the last 168 hours. BNP (last 3 results) Recent Labs    04/15/19 1225  PROBNP 711*   HbA1C: No results for input(s): HGBA1C in the last 72 hours. CBG: No results for input(s): GLUCAP in the last 168 hours. Lipid Profile: No results for input(s): CHOL, HDL, LDLCALC, TRIG, CHOLHDL, LDLDIRECT in the last 72 hours. Thyroid Function Tests: No results for input(s): TSH, T4TOTAL, FREET4,  T3FREE, THYROIDAB in the last 72 hours. Anemia Panel: No results for input(s): VITAMINB12, FOLATE, FERRITIN, TIBC, IRON, RETICCTPCT in the last 72 hours. Sepsis Labs: Recent Labs  Lab 04/18/19 0005  PROCALCITON <0.10    Recent Results (from the past 240 hour(s))  Blood culture (routine x 2)     Status: None (Preliminary result)   Collection Time: 04/17/19  8:50 PM   Specimen: BLOOD  Result Value Ref Range Status   Specimen Description BLOOD RIGHT ARM  Final   Special Requests   Final    BOTTLES DRAWN AEROBIC AND ANAEROBIC Blood Culture results may not be optimal due to an inadequate volume of blood received in culture bottles   Culture   Final    NO GROWTH < 12 HOURS Performed at [facility], [address] [place], [place] [address]    Report Status PENDING  Incomplete  Blood culture (routine x 2)     Status: None (Preliminary result)   Collection Time: 04/17/19  9:07 PM   Specimen: BLOOD  Result Value Ref Range Status   Specimen Description BLOOD RIGHT FOREARM  Final   Special Requests   Final    BOTTLES DRAWN AEROBIC AND ANAEROBIC Blood Culture adequate volume   Culture   Final    NO GROWTH < 12 HOURS Performed at [facility], [address] [place], [place] [address]    Report Status PENDING  Incomplete  Respiratory Panel by RT PCR (Flu A&B, Covid) - Nasopharyngeal Swab     Status: None   Collection Time: 04/17/19  9:40 PM   Specimen: Nasopharyngeal Swab  Result Value Ref Range Status   SARS Coronavirus 2 by RT PCR NEGATIVE NEGATIVE Final    Comment: (NOTE) SARS-CoV-2 target nucleic acids are NOT DETECTED. The SARS-CoV-2 RNA is generally detectable in upper respiratoy specimens during the acute phase of infection. The lowest concentration of SARS-CoV-2 viral copies this assay can detect is 131 copies/mL. A negative result does not preclude SARS-Cov-2 infection and should not be used as the sole basis for treatment or other patient management  decisions. A negative result may occur with  improper specimen collection/handling, submission of specimen other than nasopharyngeal swab,  presence of viral mutation(s) within the areas targeted by this assay, and inadequate number of viral copies (<131 copies/mL). A negative result must be combined with clinical observations, patient history, and epidemiological information. The expected result is Negative. Fact Sheet for Patients:  PinkCheek.be Fact Sheet for Healthcare Providers:  GravelBags.it This test is not yet ap proved or cleared by the [place] FDA and  has been authorized for detection and/or diagnosis of SARS-CoV-2 by FDA under an Emergency Use Authorization (EUA). This EUA will remain  in effect (meaning this test can be used) for the duration of the COVID-19 declaration under Section 564(b)(1) of the Act, 21 U.S.C. section 360bbb-3(b)(1), unless the authorization is terminated or revoked sooner.    Influenza A by PCR NEGATIVE NEGATIVE Final   Influenza B by PCR NEGATIVE NEGATIVE Final    Comment: (NOTE) The Xpert Xpress SARS-CoV-2/FLU/RSV assay is intended as an aid in  the diagnosis of influenza from Nasopharyngeal swab specimens and  should not be used as a sole basis for treatment. Nasal washings and  aspirates are unacceptable for Xpert Xpress SARS-CoV-2/FLU/RSV  testing. Fact Sheet for Patients: PinkCheek.be Fact Sheet for Healthcare Providers: GravelBags.it This test is not yet approved or cleared by the [place] FDA and  has been authorized for detection and/or diagnosis of SARS-CoV-2 by  FDA under an Emergency Use Authorization (EUA). This EUA will remain  in effect (meaning this test can be used) for the duration of the  Covid-19 declaration under Section 564(b)(1) of the Act, 21  U.S.C. section 360bbb-3(b)(1), unless the authorization  is  terminated or revoked. Performed at [facility], [place] [address] [place], [place] [address]   SARS CORONAVIRUS 2 (TAT 6-24 HRS) Nasopharyngeal Nasopharyngeal Swab     Status: None   Collection Time: 04/18/19 12:35 AM   Specimen: Nasopharyngeal Swab  Result Value Ref Range Status   SARS Coronavirus 2 NEGATIVE NEGATIVE Final    Comment: (NOTE) SARS-CoV-2 target nucleic acids are NOT DETECTED. The SARS-CoV-2 RNA is generally detectable in upper and lower respiratory specimens during the acute phase of infection. Negative results do not preclude SARS-CoV-2 infection, do not rule out co-infections with other pathogens, and should not be used as the sole basis for treatment or other patient management decisions. Negative results must be combined with clinical observations, patient history, and epidemiological information. The expected result is Negative. Fact Sheet for Patients: SugarRoll.be Fact Sheet for Healthcare Providers: https://www.woods-mathews.com/ This test is not yet approved or cleared by the [place] FDA and  has been authorized for detection and/or diagnosis of SARS-CoV-2 by FDA under an Emergency Use Authorization (EUA). This EUA will remain  in effect (meaning this test can be used) for the duration of the COVID-19 declaration under Section 56 4(b)(1) of the Act, 21 U.S.C. section 360bbb-3(b)(1), unless the authorization is terminated or revoked sooner. Performed at [facility], [place] [address] [place],  [address]     Radiology Studies: DG Chest 2 View  Result Date: 04/17/2019 CLINICAL DATA:  Weakness and chest pain. EXAM: CHEST - 2 VIEW COMPARISON:  April 04, 2019 FINDINGS: The lungs are hyperinflated. Very mild, hazy left basilar atelectasis and/or infiltrate is noted. This represents a new finding when compared to the prior study. There is no evidence of a pleural effusion or pneumothorax. The  heart size and mediastinal contours are within normal limits. There is moderate severity calcification of the aortic arch. The visualized skeletal structures are unremarkable. IMPRESSION:  1. New mild, hazy left basilar atelectasis and/or infiltrate. Electronically Signed   By: Aram Candela M.D.   On: 04/17/2019 16:53   VAS [place] LOWER EXTREMITY VENOUS (DVT)  Result Date: 04/18/2019  Lower Venous Study Indications: Swelling, and Edema.  Risk Factors: History of CAD, PVD and COPD. Performing Technologist: Marilynne Halsted RDMS, RVT  Examination Guidelines: A complete evaluation includes B-mode imaging, spectral Doppler, color Doppler, and power Doppler as needed of all accessible portions of each vessel. Bilateral testing is considered an integral part of a complete examination. Limited examinations for reoccurring indications may be performed as noted.  +-----+---------------+---------+-----------+----------+--------------+ RIGHTCompressibilityPhasicitySpontaneityPropertiesThrombus Aging +-----+---------------+---------+-----------+----------+--------------+ CFV  Full           Yes      Yes                                 +-----+---------------+---------+-----------+----------+--------------+ SFJ  Full                                                        +-----+---------------+---------+-----------+----------+--------------+   +---------+---------------+---------+-----------+----------+--------------+ LEFT     CompressibilityPhasicitySpontaneityPropertiesThrombus Aging +---------+---------------+---------+-----------+----------+--------------+ CFV      Full           Yes      Yes                                 +---------+---------------+---------+-----------+----------+--------------+ SFJ      Full                                                        +---------+---------------+---------+-----------+----------+--------------+ FV Prox  Full                                                         +---------+---------------+---------+-----------+----------+--------------+ FV Mid   Full                                                        +---------+---------------+---------+-----------+----------+--------------+ FV DistalFull                                                        +---------+---------------+---------+-----------+----------+--------------+ PFV      Full                                                        +---------+---------------+---------+-----------+----------+--------------+ POP  Full           Yes      Yes                                 +---------+---------------+---------+-----------+----------+--------------+ PTV      Full                                                        +---------+---------------+---------+-----------+----------+--------------+ PERO     Full                                                        +---------+---------------+---------+-----------+----------+--------------+     Summary: Right: No evidence of common femoral vein obstruction. Left: There is no evidence of deep vein thrombosis in the lower extremity. No cystic structure found in the popliteal fossa.  *See table(s) above for measurements and observations. Electronically signed by Sherald Hesshristopher Clark MD on 04/18/2019 at 4:12:37 PM.    Final    Scheduled Meds: . enoxaparin (LOVENOX) injection  40 mg Subcutaneous Daily  . [START ON 04/19/2019] furosemide  40 mg Intravenous Daily  . ipratropium-albuterol  3 mL Inhalation Q6H  . methylPREDNISolone (SOLU-MEDROL) injection  40 mg Intravenous Daily  . sodium chloride flush  3 mL Intravenous Once   Continuous Infusions: . ceFEPime (MAXIPIME) IV Stopped (04/18/19 1523)  . vancomycin Stopped (04/18/19 1638)    LOS: 1 day   Merlene Laughtermair Latif Nanako Stopher, DO [facility] PAGER is on AMION  If 7PM-7AM, please contact night-coverage [email or web address]

## 2019-04-18 NOTE — Progress Notes (Signed)
Received report from ED Tori at 1728. Patient arrived approximately 31.   1828 RT and patient daughter Leeroy Bock at bedside; RT stated patient ok without Bipap, saturation ok on 4L; RT communicating with MD about Bipap.   Patient report pain in groin then legs and feet. Daughter at bedside and stated patient has neuropathy and pain is chronic. Night RN made aware.

## 2019-04-18 NOTE — ED Notes (Signed)
Per Dr Marland Mcalpine place pt back on bipap

## 2019-04-18 NOTE — ED Notes (Signed)
PT assisted to BSC

## 2019-04-18 NOTE — Progress Notes (Signed)
RT called about placing patient on BiPAP. When I arrived, the RN was getting ready to call report. Patient was alert and taking to me. SAT 100% on 4 LNC. RN and I decided to place patient on BiPAP upon arrival to 3E since no distress was noted at this time. Called report to 3E RT. Mask and tubing at bedside, RN stated she would transport that up with patient. RT to monitor as needed

## 2019-04-18 NOTE — Progress Notes (Signed)
Left lower ext venous duplex  has been completed. Refer to [facility] under chart review to view preliminary results.   04/18/2019  10:09 AM Patricia Wall, Gerarda Gunther

## 2019-04-18 NOTE — ED Notes (Signed)
Pt talking to Daughter on telephone.

## 2019-04-18 NOTE — Progress Notes (Signed)
Pharmacy Antibiotic Note  Patricia Wall is a 69 y.o. female admitted on 04/17/2019 with possible BCAP.  Pharmacy has been consulted for Vancomycin and Cefepime  Dosing.  Vancomycin 1250 mg IV given in ED at  2215  Plan: Vancomycin 750 mg IV q12h Est AUC 464 Cefepime 2 g IV q8h     Temp (24hrs), Avg:97.9 F (36.6 C), Min:97.7 F (36.5 C), Max:98 F (36.7 C)  Recent Labs  Lab 04/15/19 1225 04/17/19 1620  WBC 7.4 5.5  CREATININE 0.72 0.67    Estimated Creatinine Clearance: 61.3 mL/min (by C-G formula based on SCr of 0.67 mg/dL).    No Known Allergies   Eddie Candle 04/18/2019 1:33 AM

## 2019-04-18 NOTE — ED Notes (Signed)
Pt found standing at the end of her bed with BiPap on, reporting that she needed to use the restroom. Pt assisted to bedside commode by this RN. Pt reoriented to place and situation by this RN. Assisted back into bed and reoriented to the use of the call light. Pt demonstrated use of the call light for this RN and verbalized understanding.

## 2019-04-18 NOTE — ED Notes (Signed)
Attempted to call pt daughter to update with room assignment. Voicemail full. Will try again before transport

## 2019-04-18 NOTE — ED Notes (Signed)
Respiratory made aware of need for ABG 

## 2019-04-19 ENCOUNTER — Inpatient Hospital Stay (HOSPITAL_COMMUNITY): Payer: Medicare Other

## 2019-04-19 DIAGNOSIS — Z9861 Coronary angioplasty status: Secondary | ICD-10-CM

## 2019-04-19 DIAGNOSIS — I4891 Unspecified atrial fibrillation: Secondary | ICD-10-CM

## 2019-04-19 DIAGNOSIS — I2510 Atherosclerotic heart disease of native coronary artery without angina pectoris: Secondary | ICD-10-CM

## 2019-04-19 DIAGNOSIS — I5031 Acute diastolic (congestive) heart failure: Secondary | ICD-10-CM

## 2019-04-19 DIAGNOSIS — J9622 Acute and chronic respiratory failure with hypercapnia: Secondary | ICD-10-CM

## 2019-04-19 DIAGNOSIS — I208 Other forms of angina pectoris: Secondary | ICD-10-CM

## 2019-04-19 DIAGNOSIS — I10 Essential (primary) hypertension: Secondary | ICD-10-CM

## 2019-04-19 DIAGNOSIS — R0602 Shortness of breath: Secondary | ICD-10-CM

## 2019-04-19 LAB — COMPREHENSIVE METABOLIC PANEL
ALT: 27 U/L (ref 0–44)
AST: 18 U/L (ref 15–41)
Albumin: 3 g/dL — ABNORMAL LOW (ref 3.5–5.0)
Alkaline Phosphatase: 75 U/L (ref 38–126)
Anion gap: 9 (ref 5–15)
BUN: 28 mg/dL — ABNORMAL HIGH (ref 8–23)
CO2: 44 mmol/L — ABNORMAL HIGH (ref 22–32)
Calcium: 8.8 mg/dL — ABNORMAL LOW (ref 8.9–10.3)
Chloride: 90 mmol/L — ABNORMAL LOW (ref 98–111)
Creatinine, Ser: 0.73 mg/dL (ref 0.44–1.00)
GFR calc Af Amer: >60 mL/min (ref >60)
GFR calc non Af Amer: >60 mL/min (ref >60)
Glucose, Bld: 179 mg/dL — ABNORMAL HIGH (ref 70–99)
Potassium: 4.3 mmol/L (ref 3.5–5.1)
Sodium: 143 mmol/L (ref 135–145)
Total Bilirubin: 0.8 mg/dL (ref 0.3–1.2)
Total Protein: 6 g/dL — ABNORMAL LOW (ref 6.5–8.1)

## 2019-04-19 LAB — IRON AND TIBC
Iron: 55 ug/dL (ref 28–170)
Saturation Ratios: 17 % (ref 10.4–31.8)
TIBC: 322 ug/dL (ref 250–450)
UIBC: 267 ug/dL

## 2019-04-19 LAB — CBC WITH DIFFERENTIAL/PLATELET
Abs Immature Granulocytes: 0.05 10*3/uL (ref 0.00–0.07)
Basophils Absolute: 0 10*3/uL (ref 0.0–0.1)
Basophils Relative: 0 %
Eosinophils Absolute: 0 10*3/uL (ref 0.0–0.5)
Eosinophils Relative: 0 %
HCT: 27.1 % — ABNORMAL LOW (ref 36.0–46.0)
Hemoglobin: 8.2 g/dL — ABNORMAL LOW (ref 12.0–15.0)
Immature Granulocytes: 1 %
Lymphocytes Relative: 9 %
Lymphs Abs: 0.6 10*3/uL — ABNORMAL LOW (ref 0.7–4.0)
MCH: 27.6 pg (ref 26.0–34.0)
MCHC: 30.3 g/dL (ref 30.0–36.0)
MCV: 91.2 fL (ref 80.0–100.0)
Monocytes Absolute: 0.4 10*3/uL (ref 0.1–1.0)
Monocytes Relative: 6 %
Neutro Abs: 5.7 10*3/uL (ref 1.7–7.7)
Neutrophils Relative %: 84 %
Platelets: 158 10*3/uL (ref 150–400)
RBC: 2.97 MIL/uL — ABNORMAL LOW (ref 3.87–5.11)
RDW: 20 % — ABNORMAL HIGH (ref 11.5–15.5)
WBC: 6.8 10*3/uL (ref 4.0–10.5)
nRBC: 0 % (ref 0.0–0.2)

## 2019-04-19 LAB — BRAIN NATRIURETIC PEPTIDE: B Natriuretic Peptide: 212.2 pg/mL — ABNORMAL HIGH (ref 0.0–100.0)

## 2019-04-19 LAB — FOLATE: Folate: 29.2 ng/mL (ref >5.9)

## 2019-04-19 LAB — MAGNESIUM: Magnesium: 1.6 mg/dL — ABNORMAL LOW (ref 1.7–2.4)

## 2019-04-19 LAB — FERRITIN: Ferritin: 288 ng/mL (ref 11–307)

## 2019-04-19 LAB — PHOSPHORUS: Phosphorus: 4 mg/dL (ref 2.5–4.6)

## 2019-04-19 LAB — RETICULOCYTES
Immature Retic Fract: 8.3 % (ref 2.3–15.9)
RBC.: 2.96 MIL/uL — ABNORMAL LOW (ref 3.87–5.11)
Retic Count, Absolute: 54.8 10*3/uL (ref 19.0–186.0)
Retic Ct Pct: 1.9 % (ref 0.4–3.1)

## 2019-04-19 LAB — HEPARIN LEVEL (UNFRACTIONATED): Heparin Unfractionated: 0.29 IU/mL — ABNORMAL LOW (ref 0.30–0.70)

## 2019-04-19 LAB — ECHOCARDIOGRAM COMPLETE: Weight: 2215.18 oz

## 2019-04-19 LAB — TSH: TSH: 0.834 u[IU]/mL (ref 0.350–4.500)

## 2019-04-19 LAB — VITAMIN B12: Vitamin B-12: 384 pg/mL (ref 180–914)

## 2019-04-19 LAB — RPR: RPR Ser Ql: NONREACTIVE

## 2019-04-19 LAB — D-DIMER, QUANTITATIVE: D-Dimer, Quant: 0.56 ug/mL-FEU — ABNORMAL HIGH (ref 0.00–0.50)

## 2019-04-19 MED ORDER — LETROZOLE 2.5 MG PO TABS
2.5000 mg | ORAL_TABLET | Freq: Every day | ORAL | Status: DC
  Administered 2019-04-19 – 2019-04-23 (×4): 2.5 mg via ORAL
  Filled 2019-04-19 (×6): qty 1

## 2019-04-19 MED ORDER — DILTIAZEM HCL ER COATED BEADS 180 MG PO CP24
180.0000 mg | ORAL_CAPSULE | Freq: Every day | ORAL | Status: DC
  Administered 2019-04-20 – 2019-04-23 (×3): 180 mg via ORAL
  Filled 2019-04-19 (×3): qty 1

## 2019-04-19 MED ORDER — HEPARIN (PORCINE) 25000 UT/250ML-% IV SOLN
1300.0000 [IU]/h | INTRAVENOUS | Status: AC
  Administered 2019-04-19: 850 [IU]/h via INTRAVENOUS
  Administered 2019-04-20: 1100 [IU]/h via INTRAVENOUS
  Filled 2019-04-19 (×2): qty 250

## 2019-04-19 MED ORDER — DILTIAZEM HCL 25 MG/5ML IV SOLN
10.0000 mg | Freq: Once | INTRAVENOUS | Status: AC
  Administered 2019-04-19: 10 mg via INTRAVENOUS
  Filled 2019-04-19: qty 5

## 2019-04-19 MED ORDER — MAGNESIUM SULFATE 2 GM/50ML IV SOLN
2.0000 g | Freq: Once | INTRAVENOUS | Status: AC
  Administered 2019-04-19: 2 g via INTRAVENOUS
  Filled 2019-04-19: qty 50

## 2019-04-19 MED ORDER — DULOXETINE HCL 60 MG PO CPEP
60.0000 mg | ORAL_CAPSULE | Freq: Every day | ORAL | Status: DC
  Administered 2019-04-19 – 2019-04-23 (×4): 60 mg via ORAL
  Filled 2019-04-19 (×4): qty 1

## 2019-04-19 MED ORDER — METOPROLOL SUCCINATE ER 25 MG PO TB24: 25.0000 mg | ORAL_TABLET | Freq: Every day | ORAL | Status: DC

## 2019-04-19 MED ORDER — SODIUM CHLORIDE 0.9% FLUSH
3.0000 mL | Freq: Two times a day (BID) | INTRAVENOUS | Status: DC
  Administered 2019-04-19 – 2019-04-23 (×6): 3 mL via INTRAVENOUS

## 2019-04-19 MED ORDER — ATORVASTATIN CALCIUM 80 MG PO TABS
80.0000 mg | ORAL_TABLET | Freq: Every day | ORAL | Status: DC
  Administered 2019-04-19 – 2019-04-23 (×4): 80 mg via ORAL
  Filled 2019-04-19 (×4): qty 1

## 2019-04-19 MED ORDER — NITROGLYCERIN 0.4 MG SL SUBL: 0.4000 mg | SUBLINGUAL_TABLET | SUBLINGUAL | Status: DC | PRN

## 2019-04-19 MED ORDER — DONEPEZIL HCL 5 MG PO TABS
5.0000 mg | ORAL_TABLET | Freq: Every day | ORAL | Status: DC
  Administered 2019-04-19 – 2019-04-22 (×4): 5 mg via ORAL
  Filled 2019-04-19 (×4): qty 1

## 2019-04-19 MED ORDER — FUROSEMIDE 10 MG/ML IJ SOLN
40.0000 mg | Freq: Two times a day (BID) | INTRAMUSCULAR | Status: DC
  Administered 2019-04-19 – 2019-04-23 (×7): 40 mg via INTRAVENOUS
  Filled 2019-04-19 (×7): qty 4

## 2019-04-19 MED ORDER — LOSARTAN POTASSIUM 25 MG PO TABS
25.0000 mg | ORAL_TABLET | Freq: Every day | ORAL | Status: DC
  Administered 2019-04-19 – 2019-04-23 (×4): 25 mg via ORAL
  Filled 2019-04-19 (×4): qty 1

## 2019-04-19 MED ORDER — GABAPENTIN 300 MG PO CAPS
300.0000 mg | ORAL_CAPSULE | Freq: Three times a day (TID) | ORAL | Status: DC
  Administered 2019-04-19 – 2019-04-23 (×10): 300 mg via ORAL
  Filled 2019-04-19 (×10): qty 1

## 2019-04-19 MED ORDER — IPRATROPIUM BROMIDE 0.02 % IN SOLN
0.5000 mg | Freq: Three times a day (TID) | RESPIRATORY_TRACT | Status: DC
  Administered 2019-04-19 – 2019-04-23 (×12): 0.5 mg via RESPIRATORY_TRACT
  Filled 2019-04-19 (×12): qty 2.5

## 2019-04-19 MED ORDER — DILTIAZEM HCL ER COATED BEADS 120 MG PO CP24
120.0000 mg | ORAL_CAPSULE | Freq: Every day | ORAL | Status: DC
  Administered 2019-04-19: 120 mg via ORAL
  Filled 2019-04-19: qty 1

## 2019-04-19 MED ORDER — DIGOXIN 0.25 MG/ML IJ SOLN
0.2500 mg | Freq: Every day | INTRAMUSCULAR | Status: DC
  Filled 2019-04-19: qty 2

## 2019-04-19 MED ORDER — MOMETASONE FURO-FORMOTEROL FUM 200-5 MCG/ACT IN AERO
2.0000 | INHALATION_SPRAY | Freq: Two times a day (BID) | RESPIRATORY_TRACT | Status: DC
  Administered 2019-04-19 – 2019-04-22 (×6): 2 via RESPIRATORY_TRACT
  Filled 2019-04-19 (×2): qty 8.8

## 2019-04-19 MED ORDER — LEVALBUTEROL HCL 0.63 MG/3ML IN NEBU
0.6300 mg | INHALATION_SOLUTION | Freq: Three times a day (TID) | RESPIRATORY_TRACT | Status: DC
  Administered 2019-04-19 – 2019-04-23 (×12): 0.63 mg via RESPIRATORY_TRACT
  Filled 2019-04-19 (×12): qty 3

## 2019-04-19 MED ORDER — DIGOXIN 0.25 MG/ML IJ SOLN
0.2500 mg | Freq: Every day | INTRAMUSCULAR | Status: AC
  Administered 2019-04-19: 0.25 mg via INTRAVENOUS

## 2019-04-19 MED ORDER — PANTOPRAZOLE SODIUM 40 MG PO TBEC
40.0000 mg | DELAYED_RELEASE_TABLET | Freq: Every day | ORAL | Status: DC
  Administered 2019-04-19 – 2019-04-23 (×4): 40 mg via ORAL
  Filled 2019-04-19 (×4): qty 1

## 2019-04-19 MED ORDER — DILTIAZEM HCL-DEXTROSE 125-5 MG/125ML-% IV SOLN (PREMIX)
5.0000 mg/h | INTRAVENOUS | Status: DC
  Filled 2019-04-19: qty 125

## 2019-04-19 NOTE — Progress Notes (Addendum)
Notified by tele at 1923 patient had 10 beat of SVT with HR up to 150. RN to bedside at 1930; patient resting and alert.No distress.   Paged Bodenheimer at [phone].Per tele 10 beat run SVT w/HR up to 150.RN to bedside 1930,pt. resting alert/no distress.call back please  Spoke with night NP Bodenheimer approx 1940. Informed patient on 4L [place] and current Bipap order; orders updated. Informed night RN.

## 2019-04-19 NOTE — Consult Note (Signed)
Cardiology Consultation:   Patient ID: [name] MRN: [id number]; DOB: 09-25-1950  Admit date: 04/17/2019 Date of Consult: 04/19/2019  Primary Care Provider: Bernerd Limbo, MD Primary Cardiologist: Jenkins Rouge, MD  Primary Electrophysiologist:  None    Patient Profile:   Patricia Wall is a 69 y.o. female with a hx CAD s/p multiple stenting, PVD, COPD on home O2 4 L, carotid disease, hyperlipidemia, tobacco abuse, and recent admission to [facility] where she was treated for paroxysmal A. Fib, heart failure, and acute anemia who is being seen today for the evaluation of heart failure at the request of Dr. Alfredia Ferguson.  History of Present Illness:   Most history obtained per chart review given that the patient does not remember much from that day.   Ms. Cutshaw is followed by Dr.Nishan.  He had stenting to the LAD, distal LCx and OM 2 in 2005, in-stent restenosis with balloon in 2006, cath with patent stents in 2011.  Had recurrent chest pain and underwent restenting to LCx in 2015.  History includes 60 to 75% LICA stenosis that has not been checked since 2016. m echo from 2016 showed EF of 65 to 70% with no valve disease. Patient was seen and August 2020 reporting intermittent chest pain with nitroglycerin use not interested in further testing. Patient was last seen 04/10/2019 reporting that she had been hospitalized at [facility] earlier that month and found to have CHF and paroxysmal A. fib and started on metoprolol and CCB, Lasix was decreased. She saw her PCP on 1/21 and her Lasix was increased from 40 daily to twice daily to worsening lower leg edema. Initiation of anticoagulation was deferred to cardiology so she remained on aspirin and Plavix. During the hospitalization she was transferred 2 units of blood acute anemia.  Was decided the Plavix would be held. Patient was noted to be in sinus at the visit. GI work-up was discussed as well as hospice.   The patient presented to the ED  04/17/2019 for weakness. Since her hospitalization at [facility] she reported to have worsening fatigue. She remained on her 4 L of oxygen. Reportedly the patient was sent home with a CPAP but the family is not sure if she is using it.  She had missed her evening dose of Lasix.  Not any dark stools or bloody stools.  Family is not sure if they want to pursue GI work-up.   In the ED BP 101/47, tachypneic, and hypercarbic requiring BiPAP.  BNP 137. HS troponin 14 > 14.  Hemoglobin was 9 which appeared to be around her baseline, thrombocytopenia 149.  Creatinine 0.6.  WBC 5.5.  Chest x-ray showed new mild hazy left basilar atelectasis and/or infiltrate. She was given 40 mg Lasix started on antibiotics and admitted. This morning patient went into Afib RVR, rates 150s and was given Digoxin x 1. She converted back to SR after a couple hours.   The patient is awake and alert on my interview but unable to recount the events of her hospitalization. She denies history of Unstable angina. She does remember feeling worsening SOB on exertion and abdominal pain. She feels her breathing has greatly improved and is back on her 4L O2. She said she quit smoking 04/16/19 and plans to continue this. She does smoke weed about once weekly. She lives with her ex-husband and are able to help care for each other. She is mostly wheelchair bound but can use a walker to get around. Is high fall risk.  Heart Pathway Score:     Past Medical History:  Diagnosis Date  . Abnormal chest xray   . Alcohol abuse   . Back pain   . CAD (coronary artery disease)     Prev seen by [facility].  Stent to OM and LAD Last cath 6/11 cathed on June 13, [name]. 2011.  The cardiac catheterization showed 30% and 50% lesions in the mid  and distal LAD, 50% in the OM1, 40% in the OM2 with 20% in-stent  restenosis, 40% in-stent restenosis in the circumflex and no significant  disease in the RCA.  Her EF was normal.  Dr. Olevia Perches recommended a trial  of proton pump  inhibitors   . Chest pain   . COPD (chronic obstructive pulmonary disease) ([place])   . Cyanosis   . DDD (degenerative disc disease)   . Depression   . Diarrhea   . Diverticular disease   . GERD (gastroesophageal reflux disease)   . H/O: hysterectomy   . Hemorrhoids   . HTN (hypertension)   . IBS (irritable bowel syndrome)   . Insomnia   . Lymphadenitis   . Pharyngitis   . PUD (peptic ulcer disease)   . PVD (peripheral vascular disease) ([place])   . Sinusitis   . TIA (transient ischemic attack)   . Tobacco abuse   . Vertigo     Past Surgical History:  Procedure Laterality Date  . ABDOMINAL HYSTERECTOMY    . APPENDECTOMY    . BREAST LUMPECTOMY Left 2015  . CARDIAC CATHETERIZATION  2005, 2006, 2009,2011  . CHOLECYSTECTOMY    . CORONARY STENT PLACEMENT     Status post balloon angiogram plasty and Cypher stent to the distal    circumflex and OM2 branch  . Hammertoe repair    . Left Bunionectomy    . LEFT HEART CATHETERIZATION WITH CORONARY ANGIOGRAM N/A 12/03/2013   Procedure: LEFT HEART CATHETERIZATION WITH CORONARY ANGIOGRAM;  Surgeon: Leonie Man, MD;  Location[facility] C[facility] LAB;  Service: Cardiovascular;  Laterality: N/A;  . TUBAL LIGATION       Home Medications:  Prior to Admission medications   Medication Sig Start Date End Date Taking? Authorizing Provider  acidophilus (RISAQUAD) CAPS capsule Take 1 capsule by mouth 2 (two) times daily. 04/09/19  Yes [provider]  albuterol (PROVENTIL) (2.5 MG/3ML) 0.083% nebulizer solution USE 1 VIAL IN NEBULIZER EVERY 4 HOURS AS NEEDED FOR WHEEZING OR SHORTNESS OF BREATH. Patient taking differently: Take 2.5 mg by nebulization every 4 (four) hours as needed for wheezing or shortness of breath. USE 1 VIAL IN NEBULIZER EVERY 4 HOURS AS NEEDED FOR WHEEZING OR SHORTNESS OF BREATH. 07/14/17  Yes Tanda Rockers, MD  albuterol (VENTOLIN HFA) 108 (90 Base) MCG/ACT inhaler Inhale 2 puffs into the lungs every 6 (six) hours as needed for  wheezing or shortness of breath. 07/13/17  Yes Tanda Rockers, MD  atorvastatin (LIPITOR) 80 MG tablet Take 80 mg by mouth daily.   Yes [provider]  budesonide-formoterol (SYMBICORT) 160-4.5 MCG/ACT inhaler Inhale 2 puffs into the lungs 2 (two) times daily. 10/26/16  Yes Parrett, Tammy S, NP  butalbital-acetaminophen-caffeine (FIORICET, ESGIC) 50-325-40 MG tablet Take 1-1.5 tablets by mouth every 8 (eight) hours as needed for headache or migraine. 1 - 1 and 1/2 every 8 hours as needed for headache    Yes [provider]  calcium carbonate (OS-CAL) 600 MG TABS Take 600 mg by mouth every morning.    Yes [provider]  Cholecalciferol (VITAMIN D) 2000 UNITS tablet Take 2,000 Units by mouth every morning.    Yes [provider]  Dextromethorphan-Guaifenesin ([place] FAST-MAX DM MAX) 5-100 MG/5ML LIQD Take 5 mLs by mouth every 4 (four) hours as needed (cough/ congestiom). 1 tsp every 4 hours as needed for cough/congestion    Yes [provider]  Diclofenac Potassium (CAMBIA) 50 MG PACK Take 50 mg by mouth as directed. Take at onset of headache as needed    Yes [provider]  diltiazem (CARDIZEM CD) 120 MG 24 hr capsule Take 120 mg by mouth daily.  04/05/19  Yes [provider]  donepezil (ARICEPT) 5 MG tablet Take 5 mg by mouth daily.  01/10/18  Yes [provider]  DULoxetine (CYMBALTA) 60 MG capsule Take 60 mg by mouth every morning.    Yes [provider]  Erenumab-aooe (AIMOVIG [place]) Inject 1 Dose into the skin as directed.    Yes [provider]  furosemide (LASIX) 40 MG tablet Take 1 tablet by mouth 2 (two) times daily. 04/07/19  Yes [provider]  gabapentin (NEURONTIN) 300 MG capsule Take 300 mg by mouth 3 (three) times daily.    Yes [provider]  letrozole (FEMARA) 2.5 MG tablet Take 2.5 mg by mouth every morning.    Yes [provider]  losartan (COZAAR) 25 MG tablet Take 25  mg by mouth daily.  04/05/19  Yes [provider]  metFORMIN (GLUCOPHAGE) 1000 MG tablet Take 1,000 mg by mouth 2 (two) times daily with a meal.   Yes [provider]  methocarbamol (ROBAXIN) 750 MG tablet Take 750 mg by mouth every 8 (eight) hours as needed for muscle spasms.   Yes [provider]  metoprolol succinate (TOPROL-XL) 25 MG 24 hr tablet Take 25 mg by mouth daily.  04/05/19  Yes [provider]  Multiple Vitamin (MULTIVITAMIN) tablet Take 1 tablet by mouth daily.    Yes [provider]  nitroGLYCERIN (NITROSTAT) 0.4 MG SL tablet Place 1 tablet (0.4 mg total) under the tongue every 5 (five) minutes as needed for chest pain (may repeat x3). 02/02/18  Yes Josue Hector, MD  omeprazole (PRILOSEC) 40 MG capsule Take 40 mg by mouth daily before breakfast.    Yes [provider]  ondansetron (ZOFRAN) 8 MG tablet Take 8 mg by mouth every 4 (four) hours as needed for nausea.    Yes [provider]  OXYGEN Use 4L 24/7   Yes [provider]  potassium chloride SA (KLOR-CON M20) 20 MEQ tablet Take 20 mEq by mouth 2 (two) times daily.  04/05/19  Yes [provider]  Respiratory Therapy Supplies (FLUTTER) DEVI Use as directed 07/13/17  Yes Tanda Rockers, MD  Tiotropium Bromide Monohydrate (SPIRIVA RESPIMAT) 2.5 MCG/ACT AERS Inhale 2 puffs into the lungs daily. 10/26/16  Yes Parrett, Tammy S, NP  acetaminophen-codeine (TYLENOL #3) 300-30 MG tablet TAKE 1 TABLET EVERY 4 HOURS AS NEEDED FOR COUGH Patient not taking: Reported on 04/18/2019 03/02/17   Tanda Rockers, MD  blood glucose meter kit and supplies Dispense based on patient and insurance preference. Use up to four times daily as directed. (FOR ICD-9 250.00, 250.01). 01/13/15   Reyne Dumas, MD  Lancets (FREESTYLE) lancets Use as instructed 01/13/15   Reyne Dumas, MD    Inpatient Medications: Scheduled Meds: . enoxaparin (LOVENOX) injection  40 mg Subcutaneous  Daily  . furosemide  40 mg Intravenous Daily  . ipratropium  0.5 mg Nebulization TID  . levalbuterol  0.63 mg Nebulization TID  . methylPREDNISolone (SOLU-MEDROL) injection  40 mg Intravenous Q12H  . sodium chloride flush  3 mL Intravenous Once   Continuous Infusions: . ceFEPime (MAXIPIME) IV 2 g (04/19/19 0946)  . vancomycin 750 mg (04/18/19 2149)   PRN Meds: methocarbamol, traMADol  Allergies:   No Known Allergies  Social History:   Social History   Socioeconomic History  . Marital status: Legally Separated    Spouse name: Not on file  . Number of children: Not on file  . Years of education: Not on file  . Highest education level: Not on file  Occupational History  . Not on file  Tobacco Use  . Smoking status: Current Every Day Smoker    Packs/day: 2.00    Years: 52.00    Pack years: 104.00    Types: Cigarettes  . Smokeless tobacco: Never Used  . Tobacco comment: down to 0.5ppd   Substance and Sexual Activity  . Alcohol use: No    Alcohol/week: 0.0 standard drinks  . Drug use: Yes    Comment: Seldom Marijuana use- 1Xmonth  . Sexual activity: Not on file  Other Topics Concern  . Not on file  Social History Narrative  . Not on file   Social Determinants of Health   Financial Resource Strain:   . Difficulty of Paying Living Expenses: Not on file  Food Insecurity:   . Worried About Charity fundraiser in the Last Year: Not on file  . Ran Out of Food in the Last Year: Not on file  Transportation Needs:   . Lack of Transportation (Medical): Not on file  . Lack of Transportation (Non-Medical): Not on file  Physical Activity:   . Days of Exercise per Week: Not on file  . Minutes of Exercise per Session: Not on file  Stress:   . Feeling of Stress : Not on file  Social Connections:   . Frequency of Communication with Friends and Family: Not on file  . Frequency of Social Gatherings with Friends and Family: Not on file  . Attends Religious Services: Not on file    . Active Member of Clubs or Organizations: Not on file  . Attends Archivist Meetings: Not on file  . Marital Status: Not on file  Intimate Partner Violence:   . Fear of Current or Ex-Partner: Not on file  . Emotionally Abused: Not on file  . Physically Abused: Not on file  . Sexually Abused: Not on file    Family History:   Family History  Problem Relation Age of Onset  . Heart disease Mother   . Rheumatologic disease Mother   . Cancer - Other Mother        Uterine  . Emphysema Father   . Heart disease Father   . Cancer - Other Father   . Coronary artery disease Other   . Asthma Sister        2 sisters  . Stroke Sister      ROS:  Please see the history of present illness.  All other ROS reviewed and negative.     Physical Exam/Data:   Vitals:   04/19/19 0743 04/19/19 0800 04/19/19 0814 04/19/19 0944  BP:  117/66 133/71 140/67  Pulse: (!) 116 (!) 122 (!) 120 (!) 118  Resp: 20 (!) 24 18 (!) 21  Temp:  98.5 F (36.9 C) 98.3 F (36.8 C)   TempSrc:  Oral  Oral   SpO2: 100% 100% 95% 100%  Weight:        Intake/Output Summary (Last 24 hours) at 04/19/2019 1224 Last data filed at 04/19/2019 1010 Gross per 24 hour  Intake 226.6 ml  Output 1400 ml  Net -1173.4 ml   Last 3 Weights 04/19/2019 04/12/2019 01/11/2019  Weight (lbs) 138 lb 7.2 oz 152 lb 147 lb  Weight (kg) 62.8 kg 68.947 kg 66.679 kg     Body mass index is 22.35 kg/m.  General:  Well nourished, well developed, in no acute distress HEENT: normal Lymph: no adenopathy Neck: no JVD Endocrine:  No thryomegaly Vascular: + carotid bruits; FA pulses 2+ bilaterally without bruits  Cardiac:  normal S1, S2; tachycadic; no murmur  Lungs:  Diffusely diminished breath sounds Abd: soft, nontender, no hepatomegaly  Ext: Trace edema Musculoskeletal:  No deformities, BUE and BLE strength normal and equal Skin: warm and dry  Neuro:  CNs 2-12 intact, no focal abnormalities noted Psych:  Normal affect   EKG:   The EKG was personally reviewed and demonstrates:  Afib RVR 146 bpm. Will repeat being that she is in sinus tach now Telemetry:  Telemetry was personally reviewed and demonstrates:  NSR, at around 5 AM she went into Afib rates 140-160 until 8, then she converted to sinus tachy rates around 120 bpm  Relevant CV Studies:  Echo ordered  Echo 2016 Study Conclusions   - Left ventricle: The cavity size was normal. There was mild focal  basal hypertrophy of the septum. Systolic function was vigorous.  The estimated ejection fraction was in the range of 65% to 70%.  Wall motion was normal; there were no regional wall motion  abnormalities. Doppler parameters are consistent with abnormal  left ventricular relaxation (grade 1 diastolic dysfunction).  - Left atrium: The atrium was mildly dilated.  - Right ventricle: The cavity size was mildly dilated. Wall  thickness was normal.  - Right atrium: The atrium was mildly dilated.  - Pulmonary arteries: Systolic pressure was mildly increased. PA  peak pressure: 38 mm Hg (S).   Laboratory Data:  High Sensitivity Troponin:   Recent Labs  Lab 04/17/19 1620 04/17/19 2059  TROPONINIHS 14 14     Chemistry Recent Labs  Lab 04/18/19 0608 04/18/19 1826 04/19/19 0515  NA 143 143 143  K 3.8 4.2 4.3  CL 88* 88* 90*  CO2 45* 42* 44*  GLUCOSE 118* 183* 179*  BUN 18 24* 28*  CREATININE 0.74 0.77 0.73  CALCIUM 8.9 8.8* 8.8*  GFRNONAA >60 >60 >60  GFRAA >60 >60 >60  ANIONGAP 10 13 9     Recent Labs  Lab 04/15/19 1225 04/18/19 1826 04/19/19 0515  PROT 5.5* 6.0* 6.0*  ALBUMIN 3.4* 2.9* 3.0*  AST 18 17 18   ALT 32 27 27  ALKPHOS 82 81 75  BILITOT 0.4 0.9 0.8   Hematology Recent Labs  Lab 04/17/19 1620 04/17/19 1620 04/17/19 2113 04/18/19 0608 04/19/19 0702  WBC 5.5  --   --  4.5 6.8  RBC 3.30*  --   --  3.04* 2.97*  HGB 9.0*   < > 10.2* 8.4* 8.2*  HCT 31.3*   < > 30.0* 29.5* 27.1*  MCV 94.8  --   --  97.0 91.2  MCH  27.3  --   --  27.6 27.6  MCHC 28.8*  --   --  28.5* 30.3  RDW 18.9*  --   --  19.4* 20.0*  PLT 149*  --   --  124* 158   < > = values in this interval not displayed.   BNP Recent Labs  Lab 04/15/19 1225 04/17/19 2059 04/19/19 0702  BNP  --  187.1* 212.2*  PROBNP 711*  --   --     DDimer  Recent Labs  Lab 04/19/19 0706  DDIMER 0.56*     Radiology/Studies:  DG Chest 2 View  Result Date: 04/17/2019 CLINICAL DATA:  Weakness and chest pain. EXAM: CHEST - 2 VIEW COMPARISON:  April 04, 2019 FINDINGS: The lungs are hyperinflated. Very mild, hazy left basilar atelectasis and/or infiltrate is noted. This represents a new finding when compared to the prior study. There is no evidence of a pleural effusion or pneumothorax. The heart size and mediastinal contours are within normal limits. There is moderate severity calcification of the aortic arch. The visualized skeletal structures are unremarkable. IMPRESSION: 1. New mild, hazy left basilar atelectasis and/or infiltrate. Electronically Signed   By: Virgina Norfolk M.D.   On: 04/17/2019 16:53   DG CHEST PORT 1 VIEW  Result Date: 04/19/2019 CLINICAL DATA:  Shortness of breath, COPD, BiPAP EXAM: PORTABLE CHEST 1 VIEW COMPARISON:  Radiograph 04/17/2019 FINDINGS: Chronic hyperinflation. Increasing hazy reticular opacities in the lower lungs. Could reflect developing edema given septal thickening and some cephalized vascularity. No pneumothorax or visible effusion. Cardiomediastinal contours are stable from prior with a calcified aorta. Telemetry leads overlie the chest. No acute osseous or soft tissue abnormality. Degenerative changes are present in the imaged spine and shoulders. IMPRESSION: Increasing basilar interstitial opacities, suspect developing edema. Electronically Signed   By: Lovena Le M.D.   On: 04/19/2019 06:36   VAS [place] LOWER EXTREMITY VENOUS (DVT)  Result Date: 04/18/2019  Lower Venous Study Indications: Swelling, and Edema.   Risk Factors: History of CAD, PVD and COPD. Performing Technologist: Oda Cogan RDMS, RVT  Examination Guidelines: A complete evaluation includes B-mode imaging, spectral Doppler, color Doppler, and power Doppler as needed of all accessible portions of each vessel. Bilateral testing is considered an integral part of a complete examination. Limited examinations for reoccurring indications may be performed as noted.  +-----+---------------+---------+-----------+----------+--------------+ RIGHTCompressibilityPhasicitySpontaneityPropertiesThrombus Aging +-----+---------------+---------+-----------+----------+--------------+ CFV  Full           Yes      Yes                                 +-----+---------------+---------+-----------+----------+--------------+ SFJ  Full                                                        +-----+---------------+---------+-----------+----------+--------------+   +---------+---------------+---------+-----------+----------+--------------+ LEFT     CompressibilityPhasicitySpontaneityPropertiesThrombus Aging +---------+---------------+---------+-----------+----------+--------------+ CFV      Full           Yes      Yes                                 +---------+---------------+---------+-----------+----------+--------------+ SFJ      Full                                                        +---------+---------------+---------+-----------+----------+--------------+  FV Prox  Full                                                        +---------+---------------+---------+-----------+----------+--------------+ FV Mid   Full                                                        +---------+---------------+---------+-----------+----------+--------------+ FV DistalFull                                                        +---------+---------------+---------+-----------+----------+--------------+ PFV      Full                                                         +---------+---------------+---------+-----------+----------+--------------+ POP      Full           Yes      Yes                                 +---------+---------------+---------+-----------+----------+--------------+ PTV      Full                                                        +---------+---------------+---------+-----------+----------+--------------+ PERO     Full                                                        +---------+---------------+---------+-----------+----------+--------------+     Summary: Right: No evidence of common femoral vein obstruction. Left: There is no evidence of deep vein thrombosis in the lower extremity. No cystic structure found in the popliteal fossa.  *See table(s) above for measurements and observations. Electronically signed by Monica Martinez MD on 04/18/2019 at 4:12:37 PM.    Final     Assessment and Plan:   Acute on chronic respiratory failure/COPD - Possibly HCAP versus COPD exacerbation and possibly acute heart failure - At baseline patient is on 4 L O2 - Wbc normal.  No fever.  Covid negative. BNP 187 -The patient was started on antibiotics and duo nebs as well as steroids - She was started on IV Lasix 40 mg daily - Yesterday ABG showed PCO2 of 67 and patient remained on BiPAP - Chest x-ray today showed possible developing edema>>continue IV lasix - Today patient says her breathing is much better, she is back down to her baseline O2 requirements -Echo attempted but heart rates were too high being the  patient was in A. fib RVR. Will restart her home Diltiazem 120 mg daily  Possible HCAP - Chest x-ray possibly suggestive of pneumonia. WBC wnl and afebrile - ABX per IM - Blood cultures so far with no growth  Acute encephalopathy -In the setting of hypercarbia>> resolved. Patient is awake and alert but unable to recount the events leading to hospitalization -TSH 0.85  Acute on  chronic CHF -Echo from 2016 showed EF 65 to 70% with grade 1 diastolic dysfunction.  Echo results pending. - At home she was on Lasix 40 twice a day>>here started on IV lasix 40 mg daily - BNP mildly elevated at 187>212 - Chest X-ray today showed possible developing edema. She has trace edema on exam - creatinine is stable 0.73 - continue diuresis. I don't think patient will need much more IV diuresis - Metoprolol and losartan were held given AMS. Now that patient is PO can likely restart  CAD s/p multiple stents - HS troponin 14>14 - Patient denies chest pain - Aspirin and Plavix were held 1/26 due to acute anemia - Continue statin  - Echo pending - If pressures allow can likely re-start home toprol as above - Unsure if we will restart ASA and Plavix given need for DOAC due to afib. Patient is also high fall risk.  Anemia -Hemoglobin was 9 on admission - 10.2 > 8.4>8.2 -Denies black or bloody stools -Iron panel ordered - Aspirin and Plavix held as above  Paroxysmal atrial fibrillation - This was diagnosed at her hospitalization earlier this month but anticoagulation was deferred due to anemia - This morning patient went into Afib RVR at around 5 AM rates in the 150s and converted to sinus tachycardia with rates around 120 - EKG from the event shows A. fib with RVR  - Will repeat EKG now that she is in sinus tach -CHA2DS2-VASc = 5 (female, CHF, age, vascular disease, HTN).  Patient likely needs long-term anticoagulation. However with history of anemia and thrombocytopenia will discuss with MD. Patient states she is not wanting to undergo GI evaluation but I would re-visit this topic. Will start IV heparin per pharmacy.  Dr. Radford Pax to see -Blood pressures appear stable. Being that patient is P.O. Will restart home Diltiazem 120 mg dialy for better rate control. If rates are still up tomorrow can add on BB   For questions or updates, please contact [place] Please consult  [email or web address] for contact info under     Signed, Marla Pouliot Ninfa Meeker, PA-C  04/19/2019 12:24 PM

## 2019-04-19 NOTE — Progress Notes (Signed)
0750 night RN stated notified by tele patient back in NS rhythm, 0754 this RN to bedside, patient resting comfortably on 4L [place].   Called tele at [phone] to confirm rhythm, currently in NS and at 0743 went into NS and has been consistently in NS.   Paged Sheikh at [phone]. Pt has been in NS with SinusTach since 0743.On 4L [place] per  RT.more oriented.start Cardizem gtt? Upon callback informed MD of NS rhythm since [id number] and informed to hold Cardizem gtt. Inquired about diet with improved mentation; DO stated diet pending SLP eval.    Secure chat SLP at [phone] to request consult ASAP. Informed patient of plan for speech to come and assess with food and drink.  Spoke with [facility] face to face approx 1002; ok to give Lasix.   1025 spoke with SLP; SLP reported observed desat with eating however no distress and no resp symptoms. RN with SLP to bedside to further assess while patient eating; minimal desaturation to mid 90's when patient eating; maintained 4L on . Encouraged slow small bites.   Spoke with Rapid RN Puja during rounds at bedside, at approx 1025. Informed patient stable, no distress and in NS rhythm at this time.   Paged Sheikh at [employer].Per SLP some desat w/ eating no distress/symptoms.RN observed remained in mid 90's w/ 4L; no symptoms. Diet? Diet orders updated.   1640 spoke with MD Turner at bedside, informed HR in 110's; informed likely attributed to COPD. RN confirmed oral Cardizem started/given.  Paged Sheikh at [phone].Please call Orlie Pollen (daughter in chart)to clarify who referred pt. to hematologist & why?

## 2019-04-19 NOTE — Progress Notes (Signed)
ANTICOAGULATION CONSULT NOTE   Pharmacy Consult for Heparin Indication: atrial fibrillation  No Known Allergies  Patient Measurements: Weight: 138 lb 7.2 oz (62.8 kg) Heparin Dosing Weight: 60kg  Vital Signs: Temp: 98.7 F (37.1 C) (01/29 2230) Temp Source: Oral (01/29 2230) BP: 134/73 (01/29 2230) Pulse Rate: 113 (01/29 2230)  Labs: Recent Labs    04/17/19 1620 04/17/19 1620 04/17/19 2059 04/17/19 2113 04/17/19 2113 04/18/19 0608 04/18/19 1826 04/19/19 0515 04/19/19 0702 04/19/19 2302  HGB 9.0*   < >  --  10.2*   < > 8.4*  --   --  8.2*  --   HCT 31.3*   < >  --  30.0*  --  29.5*  --   --  27.1*  --   PLT 149*  --   --   --   --  124*  --   --  158  --   HEPARINUNFRC  --   --   --   --   --   --   --   --   --  0.29*  CREATININE 0.67  --   --   --    < > 0.74 0.77 0.73  --   --   TROPONINIHS 14  --  14  --   --   --   --   --   --   --    < > = values in this interval not displayed.    Estimated Creatinine Clearance: 61.3 mL/min (by C-G formula based on SCr of 0.73 mg/dL).   Medical History: Past Medical History:  Diagnosis Date  . Abnormal chest xray   . Alcohol abuse   . Back pain   . CAD (coronary artery disease)     Prev seen by [facility].  Stent to OM and LAD Last cath 6/11 cathed on June 13, [name]. 2011.  The cardiac catheterization showed 30% and 50% lesions in the mid  and distal LAD, 50% in the OM1, 40% in the OM2 with 20% in-stent  restenosis, 40% in-stent restenosis in the circumflex and no significant  disease in the RCA.  Her EF was normal.  Dr. Juanda Chance recommended a trial  of proton pump inhibitors   . Chest pain   . COPD (chronic obstructive pulmonary disease) (HCC)   . Cyanosis   . DDD (degenerative disc disease)   . Depression   . Diarrhea   . Diverticular disease   . GERD (gastroesophageal reflux disease)   . H/O: hysterectomy   . Hemorrhoids   . HTN (hypertension)   . IBS (irritable bowel syndrome)   . Insomnia   . Lymphadenitis   .  Pharyngitis   . PUD (peptic ulcer disease)   . PVD (peripheral vascular disease) (HCC)   . Sinusitis   . TIA (transient ischemic attack)   . Tobacco abuse   . Vertigo     Assessment: 69 year old with recent history of afib, and new afib with RVR this admit. Patient not on anticoagulation prior to admit d/t recent anemia. Hgb dropped slightly on admit but is currently stable in 8s, pltc around 150. No bleeding issues noted, currently in ST.   Discussed with cardiology, will start IV heparin (received sq lovenox this am so will not bolus).   1/29 PM update:  Initial heparin level just below goal No issues per RN  Goal of Therapy:  Heparin level 0.3-0.7 units/ml Monitor platelets by anticoagulation protocol: Yes   Plan:  Inc heparin to 950 units/hr Re-check heparin level with AM labs  Narda Bonds, PharmD, [place] Pharmacist Phone: [phone]

## 2019-04-19 NOTE — Progress Notes (Signed)
PROGRESS NOTE    [name]  ZOX:[id number] DOB: 18-May-1950 DOA: 04/17/2019 PCP: Tracey Harries, MD  Brief Narrative:  HPI per Dr. Benita Gutter on 04/17/2019 Patricia Wall is a 69 y.o. female with medical history significant of CAD withmultiple stenting, PVD andprogressiveCOPD on home supplemental O2, 4Lwith ongoing tobacco use who presented with weakness.  Pt unable to provide history due to confusion and no family at bedside. I was unable to reach daughter by phone after multiple attempts.  History obtained entirely from ED physician report and chart review.   Reportedly patient brought in for worsening fatigue over the past 48 hrs and ran out of home O2. She complaints of left sided chest pain that is reproducible on exam but otherwise says "I don't know" to all other questions.   Per cardiology documentation on 1/25, She was admitted to [facility] earlier this month and discharged on 1/15 and was there about 10 days. Found to have CHF andPAF.Placed on metoprolol and Cardizem. Also found to be anemic and was transfused 2 units of pRBC, reportedly heme negative but did not have any further work up. Decision to start anticoagulation was deferred to cardiology and ultimately she was discharged only on Plavix and aspirin.  She had follow up with PCP on 1/21 and Lasix was increased from 40mg  daily to BID due to increase peripheral edema.   It appears on 1/26 daughter had discuss with cardiology to hold aspirin and Plavix and wanted to discuss GI referral and possible hospice service with PCP.   ED Course: She was afebrile, had borderline BP of 101/47 and was tachypneic and hypercarbic requiring Bipap.  She had no leukocytosis, hemoglobin stable at 9 with chronic anemia, thrombocytopenia 149.  No significant findings on BMP. BNP of 187, troponin flat at 17.  Chest shows new mild hazy left basilar atelectasis and or infiltrate.  She was given 40mg  Lasix, Zosyn and vancomycin in the  ED.   **Interim History Patient was on the BiPAP and was complaining of shortness of breath.  Will start diuresis and continue steroids and breathing medications have been adjusted.  Remains a little bit hypercarbic.  Will obtain SLP evaluation to ensure that she can safely swallow given her AMS.  Will obtain a TSH, RPR, B12 level and consider head imaging if not improving.  Her AMS appears to be improved and SLP cleared her and she is off of the BiPAP and on 4 L of supplemental oxygen via nasal which is her home dose.  Because she went into A. fib with RVR yesterday continues to be in heart failure have called Cardiology for further evaluation recommendations  Assessment & Plan:   Active Problems:   CAD S/P percutaneous coronary angioplasty: Prior Cypher DES to pLAD, pOM2 & AVG Cx (after Om2); New DES to AVG Cx ISR & Angiosculpt PTCA of Ostial AVG Cx from OM2.   Acute respiratory failure with hypoxia (HCC)   Anemia   Thrombocytopenia (HCC)   AF (paroxysmal atrial fibrillation) (HCC)  Acute on Chronic Respiratory Failure with Hypoxia and Hypercarbia requiring noninvasive positive pressure ventilation with BiPAP; now off of BiPAP and onto 4 L of supplemental nasal cannula -Secondary to HCAP vs COPD exacerbation vs CHF exacerbation and likely a combination of all -Baseline at 4L  and she is currently on this right now -ABG done and showed a pH of 7.433, PCO2 of 67.1, and PO2 of 131, bicarbonate level of 44.0, and an ABG O2 saturation of 99.4% -No  leukocytosis or fever. Admit as PUI for COVID but this was negative so transferred to medical floor -BNP on admission was 187.1 and repeat this morning was 212.2 -Started on vancomycin and Zosyn in ED- will continue vancomycin and switch to Cefepime. -Obtained PCT was <0.10 -Duonebs q6hr changed to Xopenex/Atrovent for Improvement and because of her history of proximal atrial fibrillation -Will trial steroids and evaluate for any improvement  -Unfortunately do not have records of her latest echo results from recent admission so will repeat but not able to be done given her fast heart rates today -Appears somewhat volume overloaded -Will continue IV 40mg  daily and monitor Is and Os. Daily weights.  Cardiology recommending continuing diuresis   -Repeat chest x-ray in a.m. -When she is awake enough she will need smoking cessation counseling and nicotine patch -Patient remains hypercarbic and last ABG showed a PCO2 of 67 next-continue BiPAP and repeat ABG in 1 hour 2 hours after remaining on BiPAP -Repeat echocardiogram as above and pending to be done  -Admission Chest x-ray did show a left upper lobe "New mild, hazy left basilar atelectasis and/or infiltrate" and repeat today showed "Increasing basilar interstitial opacities, suspect developing edema." -Continue supplemental oxygen and wean off of BiPAP to nasal cannula to her home 4 L and now is on 4 Liters -Continuous pulse oximetry and maintain O2 saturations greater than 90%-we will need ambulatory home O2 screen prior to discharge  Acute Encephalopathy likely in the setting of hypercarbia rule out other etiologies, improving -Remains somewhat confused but does have donepezil on her MAR and question if she has a history of dementia; per neurology records she does have some memory impairment which has been progressively gotten worse -Check TSH 0.834; RPR was nonreactive and B12 level was 384 -Correct hypercarbia and metabolic issues if still not improving may consider further imaging with EEG and head CT and possible MRI will hold off for now since she did improve some -Obtain SLP evaluation and she is at a mild aspiration risk and they recommend regular diet with thin liquids -Treat infectious causes with pneumonia and continue antibiotics for now -If not improving and will obtain further imaging  Left LE asymmetry -Left lower extremity appears circumferentially larger than right.    -Will obtain left DVT venous Doppler ultrasound and did not show DVT  CAD s/p multiple stents  -Decision made by family with cardiology on 1/26 to hold aspirin and plavix due to recent acute on chronic anemia -Currently holding her metoprolol and losartan for her borderline blood pressure and n.p.o. status given her confusion -Resume her atorvastatin 80 mg p.o. daily now since she is safe enough to swallow along with her losartan 25 mg daily and metoprolol succinate 25 mg p.o. daily -Denied any chest pain on my examination but did state that she was short of breath -repeat echocardiogram -Cardiology is consulted for further evaluation recommendations  Acute COPD exacerbation -As above -Patient takes albuterol inhalers as well as Symbicort and albuterol nebulizers in the outpatient setting as well as to Spiriva -Continue with steroids, nebs, supplemental oxygen, as well as antibiotics as above -Currently is on ipratropium/Xopenex 3 times daily along with Solu-Medrol 40 mg every 12 and will cut down to 40 mg daily in the a.m. along with 4 L supplemental oxygen via nasal cannula and antibiotic coverage with cefepime and vancomycin; may stop vancomycin if MRSA PCR is negative  Acute on Chronic CHF -As above -She takes furosemide 40 mg p.o. twice daily at home -  Also takes losartan 25 mg p.o. daily, metoprolol tartrate 25 mg p.o. daily this has been resumed -Strict I's and O's and daily weights -Continue to monitor for signs and symptoms of volume overload and repeat echocardiogram currently -Repeat chest x-ray showed increasing pulmonary edema -Question of A. fib worsened her heart failure -Patient is -1.073 L since admission and weight is not done -Cardiology consulted for further evaluation recommendations  GERD -Takes omeprazole 40 mg p.o. daily before breakfast and will resume once able to swallow  History of Breast Cancer -She is status post chemotherapy and radiation -Currently on  letrozole and this has been resumed  Normocytic Anemia  -Appears chronic in nature -Patient hemoglobin/hematocrit on admission was 10.2/30.0 and then dropped to 8.4/29.5 and today was 8.2/27.1 -Checked anemia panel and showed an iron level of 55, U IBC of 267, TIBC of 322, saturation ratios of 17%, ferritin level 288, folate level 29.2, vitamin B12 384 -Continue to monitor for signs and symptoms of bleeding -She is being started on anticoagulation with heparin drip for her atrial fibrillation with RVR; currently no overt bleeding noted -Repeat CBC in a.m.  -A decision was made with the patient and the patient's cardiologist to hold her aspirin and Plavix due to recent acute on chronic anemia -Patient is being started on anticoagulation with IV heparin by cardiology and will need to continue for now given her elevated score risk -Patient expressed to the Cardiology team that she did not want to undergo a GI evaluation currently she is denying any black or bloody stools and anemia panel is done -Do not clinically feel that she has hemolysis given normal bilirubin and I do not feel that this is an issue of bone marrow production currently given normal reticulocyte count  Thrombocytopenia  -Unclear etiology but could be infectious in the setting of her pneumonia.  -Platelet count is now improved and 158,000 -No signs of active bleed. Continue to monitor for signs and symptoms of bleeding -Repeat CBC in the a.m.  Paroxysmal atrial fibrillation -Not on anticoagulation previously but now has been started.    Went into RVR this morning with rates in the 150s and and spontaneously converted to normal sinus rhythm and sinus tachycardia -Currently held metoprolol, diltiazem for now until she can safely swallow but now that she is passed she can resume and this is been restarted -Continue with Telemetry monitoring -Cardiology consulted for further evaluation recommendations -Cardiology started the  patient on IV heparin drip given her elevated CHA2DS2-VASc of 5 -Further discussion patient states that she did not want to undergo a GI evaluation but I will speak to her more about this in the a.m.  DVT prophylaxis: SCDs and enoxaparin 40 mg subcu daily Code Status: FULL CODE  Family Communication: Discussed with Daughter over the phone Orlie Pollen(Lynne) Disposition Plan: Remain Inpatient for continued workup and improvement of Respiratory Status and Euvolemic State. Will need PT/OT to evaluate and treat and cardiac clearance prior to discharge now they are being consulted for further evaluation  Consultants:   Cardiology   Procedures:  ECHOCARDIOGRAM ordered and pending to be done given that her heart rate was too fast  Antimicrobials:  Anti-infectives (From admission, onward)   Start     Dose/Rate Route Frequency Ordered Stop   04/18/19 1000  vancomycin (VANCOREADY) IVPB 750 mg/150 mL     750 mg 150 mL/hr over 60 Minutes Intravenous Every 12 hours 04/18/19 0140     04/18/19 0600  ceFEPIme (MAXIPIME) 2 g in  sodium chloride 0.9 % 100 mL IVPB     2 g 200 mL/hr over 30 Minutes Intravenous Every 8 hours 04/18/19 0140     04/17/19 2045  vancomycin (VANCOREADY) IVPB 1250 mg/250 mL     1,250 mg 166.7 mL/hr over 90 Minutes Intravenous  Once 04/17/19 2040 04/18/19 0008   04/17/19 2045  piperacillin-tazobactam (ZOSYN) IVPB 3.375 g     3.375 g 100 mL/hr over 30 Minutes Intravenous  Once 04/17/19 2040 04/17/19 2154     Subjective: Seen and examined bedside is off of the BiPAP and wanting something to eat.  States that her shortness of breath is stable.  No chest pain, lightheadedness or dizziness.  No nausea or vomiting but was very hungry.  No other concerns or claims at this time and seems more oriented and told me about how she is from [place].  Daughter states that she is still not back to her baseline though upon her conversation with her this afternoon.  Objective: Vitals:   04/19/19 0944  04/19/19 1100 04/19/19 1409 04/19/19 1431  BP: 140/67 (!) 143/71  131/65  Pulse: (!) 118 (!) 119  (!) 117  Resp: (!) 21 19  (!) 24  Temp:      TempSrc:      SpO2: 100% 100% 99% 100%  Weight:        Intake/Output Summary (Last 24 hours) at 04/19/2019 1448 Last data filed at 04/19/2019 1434 Gross per 24 hour  Intake 1126.6 ml  Output 1500 ml  Net -373.4 ml   Filed Weights   04/19/19 0201  Weight: 62.8 kg   Examination: Physical Exam:  Constitutional: WN/WD Caucasian female currently off of the BiPAP and appears anxious and wanting to eat Eyes: Lids and conjunctivae normal, sclerae anicteric  ENMT: External Ears, Nose appear normal. Grossly normal hearing. Mucous membranes are moist. Neck: Appears normal, supple, no cervical masses, normal ROM, no appreciable thyromegaly; minimal JVD Respiratory: Diminished to auscultation bilaterally with coarse breath sounds and some slight crackles in mild wheezing, no wheezing, rales, rhonchi or crackles.  Slightly increased respiratory effort but she is on 4 L of supplemental oxygen via nasal cannula Cardiovascular: Tachycardic rate but regular rhythm, no murmurs / rubs / gallops. S1 and S2 auscultated.  1+ lower extremity edema Abdomen: Soft, non-tender, non-distended. Bowel sounds positive.  GU: Deferred. Musculoskeletal: No clubbing / cyanosis of digits/nails. No joint deformity upper and lower extremities. Skin: No rashes, lesions, ulcers on limited skin evaluation. No induration; Warm and dry.  Neurologic: CN 2-12 grossly intact with no focal deficits. Romberg sign and cerebellar reflexes not assessed.  Psychiatric: Normal judgment and insight. Alert and oriented x 3.  Anxious mood and appropriate affect.   Data Reviewed: I have personally reviewed following labs and imaging studies  CBC: Recent Labs  Lab 04/15/19 1225 04/17/19 1620 04/17/19 2113 04/18/19 0608 04/19/19 0702  WBC 7.4 5.5  --  4.5 6.8  NEUTROABS  --   --   --   --   5.7  HGB 8.2* 9.0* 10.2* 8.4* 8.2*  HCT 27.5* 31.3* 30.0* 29.5* 27.1*  MCV 91 94.8  --  97.0 91.2  PLT 165 149*  --  124* 158   Basic Metabolic Panel: Recent Labs  Lab 04/15/19 1225 04/15/19 1225 04/17/19 1620 04/17/19 2113 04/18/19 0608 04/18/19 1826 04/19/19 0515  NA 142  --  143 139 143 143 143  K 4.5   < > 3.7 3.8 3.8 4.2 4.3  CL 90*  --  87*  --  88* 88* 90*  CO2 39*  --  44*  --  45* 42* 44*  GLUCOSE 206*  --  128*  --  118* 183* 179*  BUN 20  --  16  --  18 24* 28*  CREATININE 0.72  --  0.67  --  0.74 0.77 0.73  CALCIUM 9.4  --  9.6  --  8.9 8.8* 8.8*  MG  --   --   --   --   --   --  1.6*  PHOS  --   --   --   --   --   --  4.0   < > = values in this interval not displayed.   GFR: Estimated Creatinine Clearance: 61.3 mL/min (by C-G formula based on SCr of 0.73 mg/dL). Liver Function Tests: Recent Labs  Lab 04/15/19 1225 04/18/19 1826 04/19/19 0515  AST 18 17 18   ALT 32 27 27  ALKPHOS 82 81 75  BILITOT 0.4 0.9 0.8  PROT 5.5* 6.0* 6.0*  ALBUMIN 3.4* 2.9* 3.0*   No results for input(s): LIPASE, AMYLASE in the last 168 hours. No results for input(s): AMMONIA in the last 168 hours. Coagulation Profile: No results for input(s): INR, PROTIME in the last 168 hours. Cardiac Enzymes: No results for input(s): CKTOTAL, CKMB, CKMBINDEX, TROPONINI in the last 168 hours. BNP (last 3 results) Recent Labs    04/15/19 1225  PROBNP 711*   HbA1C: No results for input(s): HGBA1C in the last 72 hours. CBG: No results for input(s): GLUCAP in the last 168 hours. Lipid Profile: No results for input(s): CHOL, HDL, LDLCALC, TRIG, CHOLHDL, LDLDIRECT in the last 72 hours. Thyroid Function Tests: Recent Labs    04/19/19 0515  TSH 0.834   Anemia Panel: Recent Labs    04/19/19 0515 04/19/19 1156 04/19/19 1209  VITAMINB12 384  --   --   FOLATE  --  29.2  --   FERRITIN  --  288  --   TIBC  --  322  --   IRON  --  55  --   RETICCTPCT  --   --  1.9   Sepsis Labs:  Recent Labs  Lab 04/18/19 0005  PROCALCITON <0.10    Recent Results (from the past 240 hour(s))  Blood culture (routine x 2)     Status: None (Preliminary result)   Collection Time: 04/17/19  8:50 PM   Specimen: BLOOD  Result Value Ref Range Status   Specimen Description BLOOD RIGHT ARM  Final   Special Requests   Final    BOTTLES DRAWN AEROBIC AND ANAEROBIC Blood Culture results may not be optimal due to an inadequate volume of blood received in culture bottles   Culture   Final    NO GROWTH 2 DAYS Performed at [facility], [address], [place], [place]    Report Status PENDING  Incomplete  Blood culture (routine x 2)     Status: None (Preliminary result)   Collection Time: 04/17/19  9:07 PM   Specimen: BLOOD  Result Value Ref Range Status   Specimen Description BLOOD RIGHT FOREARM  Final   Special Requests   Final    BOTTLES DRAWN AEROBIC AND ANAEROBIC Blood Culture adequate volume   Culture   Final    NO GROWTH 2 DAYS Performed at [facility], [address], [place], [place]    Report Status PENDING  Incomplete  Respiratory Panel by RT PCR (Flu A&B, Covid) - Nasopharyngeal Swab     Status: None   Collection Time: 04/17/19  9:40 PM   Specimen: Nasopharyngeal Swab  Result Value Ref Range Status   SARS Coronavirus 2 by RT PCR NEGATIVE NEGATIVE Final    Comment: (NOTE) SARS-CoV-2 target nucleic acids are NOT DETECTED. The SARS-CoV-2 RNA is generally detectable in upper respiratoy specimens during the acute phase of infection. The lowest concentration of SARS-CoV-2 viral copies this assay can detect is 131 copies/mL. A negative result does not preclude SARS-Cov-2 infection and should not be used as the sole basis for treatment or other patient management decisions. A negative result may occur with  improper specimen collection/handling, submission of specimen other than nasopharyngeal swab, presence of viral mutation(s) within the  areas targeted by this assay, and inadequate number of viral copies (<131 copies/mL). A negative result must be combined with clinical observations, patient history, and epidemiological information. The expected result is Negative. Fact Sheet for Patients:  https://www.moore.com/https://www.fda.gov/media/142436/download Fact Sheet for Healthcare Providers:  https://www.young.biz/https://www.fda.gov/media/142435/download This test is not yet ap proved or cleared by the [place] FDA and  has been authorized for detection and/or diagnosis of SARS-CoV-2 by FDA under an Emergency Use Authorization (EUA). This EUA will remain  in effect (meaning this test can be used) for the duration of the COVID-19 declaration under Section 564(b)(1) of the Act, 21 U.S.C. section 360bbb-3(b)(1), unless the authorization is terminated or revoked sooner.    Influenza A by PCR NEGATIVE NEGATIVE Final   Influenza B by PCR NEGATIVE NEGATIVE Final    Comment: (NOTE) The Xpert Xpress SARS-CoV-2/FLU/RSV assay is intended as an aid in  the diagnosis of influenza from Nasopharyngeal swab specimens and  should not be used as a sole basis for treatment. Nasal washings and  aspirates are unacceptable for Xpert Xpress SARS-CoV-2/FLU/RSV  testing. Fact Sheet for Patients: https://www.moore.com/https://www.fda.gov/media/142436/download Fact Sheet for Healthcare Providers: https://www.young.biz/https://www.fda.gov/media/142435/download This test is not yet approved or cleared by the [place] FDA and  has been authorized for detection and/or diagnosis of SARS-CoV-2 by  FDA under an Emergency Use Authorization (EUA). This EUA will remain  in effect (meaning this test can be used) for the duration of the  Covid-19 declaration under Section 564(b)(1) of the Act, 21  U.S.C. section 360bbb-3(b)(1), unless the authorization is  terminated or revoked. Performed at [facility], [address] [place], [place] [address]   SARS CORONAVIRUS 2 (TAT 6-24 HRS) Nasopharyngeal Nasopharyngeal Swab      Status: None   Collection Time: 04/18/19 12:35 AM   Specimen: Nasopharyngeal Swab  Result Value Ref Range Status   SARS Coronavirus 2 NEGATIVE NEGATIVE Final    Comment: (NOTE) SARS-CoV-2 target nucleic acids are NOT DETECTED. The SARS-CoV-2 RNA is generally detectable in upper and lower respiratory specimens during the acute phase of infection. Negative results do not preclude SARS-CoV-2 infection, do not rule out co-infections with other pathogens, and should not be used as the sole basis for treatment or other patient management decisions. Negative results must be combined with clinical observations, patient history, and epidemiological information. The expected result is Negative. Fact Sheet for Patients: HairSlick.nohttps://www.fda.gov/media/138098/download Fact Sheet for Healthcare Providers: quierodirigir.comhttps://www.fda.gov/media/138095/download This test is not yet approved or cleared by the [place] FDA and  has been authorized for detection and/or diagnosis of SARS-CoV-2 by FDA under an Emergency Use Authorization (EUA). This EUA will remain  in effect (meaning this test can be used) for the  duration of the COVID-19 declaration under Section 56 4(b)(1) of the Act, 21 U.S.C. section 360bbb-3(b)(1), unless the authorization is terminated or revoked sooner. Performed at [facility], [address], [place], [place] [address]     Radiology Studies: DG Chest 2 View  Result Date: 04/17/2019 CLINICAL DATA:  Weakness and chest pain. EXAM: CHEST - 2 VIEW COMPARISON:  April 04, 2019 FINDINGS: The lungs are hyperinflated. Very mild, hazy left basilar atelectasis and/or infiltrate is noted. This represents a new finding when compared to the prior study. There is no evidence of a pleural effusion or pneumothorax. The heart size and mediastinal contours are within normal limits. There is moderate severity calcification of the aortic arch. The visualized skeletal structures are unremarkable.  IMPRESSION: 1. New mild, hazy left basilar atelectasis and/or infiltrate. Electronically Signed   By: Aram Candela M.D.   On: 04/17/2019 16:53   DG CHEST PORT 1 VIEW  Result Date: 04/19/2019 CLINICAL DATA:  Shortness of breath, COPD, BiPAP EXAM: PORTABLE CHEST 1 VIEW COMPARISON:  Radiograph 04/17/2019 FINDINGS: Chronic hyperinflation. Increasing hazy reticular opacities in the lower lungs. Could reflect developing edema given septal thickening and some cephalized vascularity. No pneumothorax or visible effusion. Cardiomediastinal contours are stable from prior with a calcified aorta. Telemetry leads overlie the chest. No acute osseous or soft tissue abnormality. Degenerative changes are present in the imaged spine and shoulders. IMPRESSION: Increasing basilar interstitial opacities, suspect developing edema. Electronically Signed   By: Kreg Shropshire M.D.   On: 04/19/2019 06:36   VAS [place] LOWER EXTREMITY VENOUS (DVT)  Result Date: 04/18/2019  Lower Venous Study Indications: Swelling, and Edema.  Risk Factors: History of CAD, PVD and COPD. Performing Technologist: Marilynne Halsted RDMS, RVT  Examination Guidelines: A complete evaluation includes B-mode imaging, spectral Doppler, color Doppler, and power Doppler as needed of all accessible portions of each vessel. Bilateral testing is considered an integral part of a complete examination. Limited examinations for reoccurring indications may be performed as noted.  +-----+---------------+---------+-----------+----------+--------------+ RIGHTCompressibilityPhasicitySpontaneityPropertiesThrombus Aging +-----+---------------+---------+-----------+----------+--------------+ CFV  Full           Yes      Yes                                 +-----+---------------+---------+-----------+----------+--------------+ SFJ  Full                                                        +-----+---------------+---------+-----------+----------+--------------+    +---------+---------------+---------+-----------+----------+--------------+ LEFT     CompressibilityPhasicitySpontaneityPropertiesThrombus Aging +---------+---------------+---------+-----------+----------+--------------+ CFV      Full           Yes      Yes                                 +---------+---------------+---------+-----------+----------+--------------+ SFJ      Full                                                        +---------+---------------+---------+-----------+----------+--------------+ FV Prox  Full                                                        +---------+---------------+---------+-----------+----------+--------------+  FV Mid   Full                                                        +---------+---------------+---------+-----------+----------+--------------+ FV DistalFull                                                        +---------+---------------+---------+-----------+----------+--------------+ PFV      Full                                                        +---------+---------------+---------+-----------+----------+--------------+ POP      Full           Yes      Yes                                 +---------+---------------+---------+-----------+----------+--------------+ PTV      Full                                                        +---------+---------------+---------+-----------+----------+--------------+ PERO     Full                                                        +---------+---------------+---------+-----------+----------+--------------+     Summary: Right: No evidence of common femoral vein obstruction. Left: There is no evidence of deep vein thrombosis in the lower extremity. No cystic structure found in the popliteal fossa.  *See table(s) above for measurements and observations. Electronically signed by Monica Martinez MD on 04/18/2019 at 4:12:37 PM.    Final    Scheduled Meds:  . diltiazem  120 mg Oral Daily  . furosemide  40 mg Intravenous Daily  . ipratropium  0.5 mg Nebulization TID  . levalbuterol  0.63 mg Nebulization TID  . methylPREDNISolone (SOLU-MEDROL) injection  40 mg Intravenous Q12H  . sodium chloride flush  3 mL Intravenous Once   Continuous Infusions: . ceFEPime (MAXIPIME) IV 2 g (04/19/19 0946)  . heparin    . vancomycin 750 mg (04/18/19 2149)    LOS: 2 days   Kerney Elbe, DO [facility] PAGER is on [place]  If 7PM-7AM, please contact night-coverage [email or web address]

## 2019-04-19 NOTE — Evaluation (Signed)
Clinical/Bedside Swallow Evaluation Patient Details  Name: Patricia Wall MRN: [id number] Date of Birth: 03-Sep-1950  Today's Date: 04/19/2019 Time: SLP Start Time (ACUTE ONLY): 1004 SLP Stop Time (ACUTE ONLY): 1018 SLP Time Calculation (min) (ACUTE ONLY): 14 min  Past Medical History:  Past Medical History:  Diagnosis Date  . Abnormal chest xray   . Alcohol abuse   . Back pain   . CAD (coronary artery disease)     Prev seen by [facility].  Stent to OM and LAD Last cath 6/11 cathed on June 13, [name]. 2011.  The cardiac catheterization showed 30% and 50% lesions in the mid  and distal LAD, 50% in the OM1, 40% in the OM2 with 20% in-stent  restenosis, 40% in-stent restenosis in the circumflex and no significant  disease in the RCA.  Her EF was normal.  Dr. Olevia Perches recommended a trial  of proton pump inhibitors   . Chest pain   . COPD (chronic obstructive pulmonary disease) ([place])   . Cyanosis   . DDD (degenerative disc disease)   . Depression   . Diarrhea   . Diverticular disease   . GERD (gastroesophageal reflux disease)   . H/O: hysterectomy   . Hemorrhoids   . HTN (hypertension)   . IBS (irritable bowel syndrome)   . Insomnia   . Lymphadenitis   . Pharyngitis   . PUD (peptic ulcer disease)   . PVD (peripheral vascular disease) ([place])   . Sinusitis   . TIA (transient ischemic attack)   . Tobacco abuse   . Vertigo    Past Surgical History:  Past Surgical History:  Procedure Laterality Date  . ABDOMINAL HYSTERECTOMY    . APPENDECTOMY    . BREAST LUMPECTOMY Left 2015  . CARDIAC CATHETERIZATION  2005, 2006, 2009,2011  . CHOLECYSTECTOMY    . CORONARY STENT PLACEMENT     Status post balloon angiogram plasty and Cypher stent to the distal    circumflex and OM2 branch  . Hammertoe repair    . Left Bunionectomy    . LEFT HEART CATHETERIZATION WITH CORONARY ANGIOGRAM N/A 12/03/2013   Procedure: LEFT HEART CATHETERIZATION WITH CORONARY ANGIOGRAM;  Surgeon: Leonie Man, MD;   Location: [facility] CATH LAB;  Service: Cardiovascular;  Laterality: N/A;  . TUBAL LIGATION     HPI:  Patricia Wall is a 69 y.o. female with medical history significant of CAD with multiple stenting, PVD and progressive COPD on home supplemental O2, 4L with ongoing tobacco use who presented with weakness. CXR 1/29: "Increasing basilar interstitial opacities, suspect developing edema"   Assessment / Plan / Recommendation Clinical Impression  Pt presents with functional oropharyngeal swallowing as assessed clinically. Pt tolerated all consistencies trialed, including thin liquid by serial straw sips, without overt s/s of aspiration. There were intermittent desaturations noted on monitoring, even to 71, which occurred with and without PO intake, but pt denied any respiratory distress and pt recovered to 90s quickly.  Although these were believed to be an artifact initially, RN discovered that [place] was disconnected from O2.  Pt requires 4L at baseline.  Lack of [place] O2 support combined with effort of PO intake, may have been responsible for desaturations.  Discussed pacing with pt, encouraging breaks to catch breath with PO intake.  RN later reported that pt was able to maintain O2 sats with floor stock snacks with her O2 connected.    Recommend regular texture diet with thin liquid.  SLP will follow for diet tolerance and  education regarding aspiration precuations.  SLP Visit Diagnosis: Dysphagia, unspecified (R13.10)    Aspiration Risk  Mild aspiration risk    Diet Recommendation Regular;Thin liquid   Liquid Administration via: Cup;Straw Medication Administration: Whole meds with liquid Supervision: Patient able to self feed Compensations: Slow rate;Small sips/bites(Take breaks as needed for respiratory support) Postural Changes: Seated upright at 90 degrees    Other  Recommendations Oral Care Recommendations: Oral care BID   Follow up Recommendations None      Frequency and Duration min 2x/week   2 weeks       Prognosis Prognosis for Safe Diet Advancement: Good      Swallow Study   General Date of Onset: 04/18/19 HPI: Patricia Wall is a 69 y.o. female with medical history significant of CAD with multiple stenting, PVD and progressive COPD on home supplemental O2, 4L with ongoing tobacco use who presented with weakness. CXR 1/29: "Increasing basilar interstitial opacities, suspect developing edema" Type of Study: Bedside Swallow Evaluation Previous Swallow Assessment: None Diet Prior to this Study: NPO Temperature Spikes Noted: No Respiratory Status: Room air(Nasal cannula had become disconnected from O2) History of Recent Intubation: No Behavior/Cognition: Alert;Cooperative;Pleasant mood Oral Cavity Assessment: Within Functional Limits Oral Care Completed by SLP: No Oral Cavity - Dentition: Dentures, top;Edentulous(bottom arch) Vision: Functional for self-feeding Self-Feeding Abilities: Able to feed self Patient Positioning: Upright in bed Baseline Vocal Quality: Normal Volitional Swallow: Able to elicit    Oral/Motor/Sensory Function Overall Oral Motor/Sensory Function: Within functional limits Facial ROM: Within Functional Limits Facial Symmetry: Within Functional Limits Lingual ROM: Within Functional Limits Lingual Symmetry: Within Functional Limits Lingual Strength: Within Functional Limits Velum: Within Functional Limits Mandible: Within Functional Limits   Ice Chips Ice chips: Not tested   Thin Liquid Thin Liquid: Within functional limits Presentation: Cup;Straw    Nectar Thick Nectar Thick Liquid: Not tested   Honey Thick Honey Thick Liquid: Not tested   Puree Puree: Within functional limits Presentation: Spoon   Solid     Solid: Within functional limits Presentation: Self Fed      Kerrie Pleasure, MA, CCC-SLP Acute Rehabilitation Services Office: [phone]  04/19/2019,11:42 AM

## 2019-04-19 NOTE — Progress Notes (Signed)
Notified Patricia Wall of Patients sudden AFibb with RVR. EKG perfomed confirming AFibb with RVR. Pt's BP initially unchanged. Pt asymptomatic on bipap. Rapid called.   Rapid at bedside and Pt's HR maintains Afibb with RVR. Received orders from Patricia Wall for Digoxin, which was administered. CMP, Mag, and Phosphorus level drawn. Pt remains without respiratory distress. BP beginning to lower and ACP notified. SBP 90-110/ DBP 65-75  D Dimer and BNP ordered and Chest xray performed. Awaiting results from Labs. ACP aware that Pt's HR maintains >155 in Afibb. No additional orders received. Will allow D dimer and BNP to result before administering additional medications.   Phineas Semen, RN

## 2019-04-19 NOTE — Significant Event (Signed)
Rapid Response Event Note  Overview: PAF  Initial Focused Assessment: Called by Berton Bon for pt with HR 160s. Known hx of PAF. Upon arrival, pt is alert and pleasantly confused on BIPAP. No CP and no SOB. BBS CTA. EKG shows afib 137-142, BP 99/64 (72), RR 18 with sats 98% on 40% FIO2. Skin is bruised in multiple places but otherwise warm, pink and dry. Cap refill about 3 secs. M. Katherina Right APP notified by RN prior to my arrival. Orders received for Digoxin and given. No change in HR after 30 mins. M. Katherina Right APP paged again and awaiting response.   Interventions: -No acute interventions by myself  Plan of Care (if not transferred): -Awaiting lab results for evaluation of electrolyte balances. Notify primary service if levels are abnormal. -May give NS bolus to support BP for additional rate control meds if she becomes hypotensive  Event Summary: Call received 0505 Arrived at call 0530  Rose Fillers

## 2019-04-19 NOTE — Progress Notes (Addendum)
RN paged NP for sustained afib with RVR, rates 140s-160s.  EKG confirms.  Pt has a hx of paroxysmal afib.  BP dropping as well with 90s/60s.  RR nurse called by floor but not currently present.  Digoxin ordered.  No improvement with digoxin.  HR 150s per RN report, BP fluctuating 90s/60s-100s/70s.  CXR, D-dimer, and BNP pending

## 2019-04-19 NOTE — Progress Notes (Signed)
ANTICOAGULATION CONSULT NOTE - Initial Consult  Pharmacy Consult for heparin Indication: atrial fibrillation  No Known Allergies  Patient Measurements: Weight: 138 lb 7.2 oz (62.8 kg) Heparin Dosing Weight: 60kg  Vital Signs: Temp: 98.3 F (36.8 C) (01/29 0814) Temp Source: Oral (01/29 0814) BP: 140/67 (01/29 0944) Pulse Rate: 118 (01/29 0944)  Labs: Recent Labs    04/17/19 1620 04/17/19 1620 04/17/19 2059 04/17/19 2113 04/17/19 2113 04/18/19 0608 04/18/19 1826 04/19/19 0515 04/19/19 0702  HGB 9.0*   < >  --  10.2*   < > 8.4*  --   --  8.2*  HCT 31.3*   < >  --  30.0*  --  29.5*  --   --  27.1*  PLT 149*  --   --   --   --  124*  --   --  158  CREATININE 0.67  --   --   --    < > 0.74 0.77 0.73  --   TROPONINIHS 14  --  14  --   --   --   --   --   --    < > = values in this interval not displayed.    Estimated Creatinine Clearance: 61.3 mL/min (by C-G formula based on SCr of 0.73 mg/dL).   Medical History: Past Medical History:  Diagnosis Date  . Abnormal chest xray   . Alcohol abuse   . Back pain   . CAD (coronary artery disease)     Prev seen by [facility].  Stent to OM and LAD Last cath 6/11 cathed on June 13, [name]. 2011.  The cardiac catheterization showed 30% and 50% lesions in the mid  and distal LAD, 50% in the OM1, 40% in the OM2 with 20% in-stent  restenosis, 40% in-stent restenosis in the circumflex and no significant  disease in the RCA.  Her EF was normal.  Dr. Juanda Chance recommended a trial  of proton pump inhibitors   . Chest pain   . COPD (chronic obstructive pulmonary disease) ([facility])   . Cyanosis   . DDD (degenerative disc disease)   . Depression   . Diarrhea   . Diverticular disease   . GERD (gastroesophageal reflux disease)   . H/O: hysterectomy   . Hemorrhoids   . HTN (hypertension)   . IBS (irritable bowel syndrome)   . Insomnia   . Lymphadenitis   . Pharyngitis   . PUD (peptic ulcer disease)   . PVD (peripheral vascular disease) ([facility])   .  Sinusitis   . TIA (transient ischemic attack)   . Tobacco abuse   . Vertigo     Assessment: 69 year old with recent history of afib, and new afib with RVR this admit. Patient not on anticoagulation prior to admit d/t recent anemia. Hgb dropped slightly on admit but is currently stable in 8s, pltc around 150. No bleeding issues noted, currently in ST.   Discussed with cardiology, will start IV heparin (received sq lovenox this am so will not bolus).   Goal of Therapy:  Heparin level 0.3-0.7 units/ml Monitor platelets by anticoagulation protocol: Yes   Plan:  Start heparin infusion at 8 units/hr Check anti-Xa level in 850 hours and daily while on heparin Continue to monitor H&H and platelets  Sheppard Coil PharmD., BCPS Clinical Pharmacist 04/19/2019 2:16 PM

## 2019-04-19 NOTE — Progress Notes (Signed)
   04/19/19 2145  MEWS Score  Temp 98.1 F (36.7 C)  BP 129/89  Pulse Rate (!) 109  ECG Heart Rate (!) 117  Resp (!) 27  SpO2 99 %  MEWS Score  MEWS Temp 0  MEWS Systolic 0  MEWS Pulse 2  MEWS RR 2  MEWS LOC 0  MEWS Score 4  MEWS Score Color Red  Pt resting in bed asymptomatic. Pt's been having on and off SVT's non sustained. Clance Boll NP made aware. Will continue to monitor Vital signs .

## 2019-04-19 NOTE — Progress Notes (Signed)
Patient tolerating 4L [place] at this time. BIPAP not needed at this time.

## 2019-04-19 NOTE — Progress Notes (Signed)
  Echocardiogram 2D Echocardiogram has been performed.  Patricia Wall 04/19/2019, 4:55 PM

## 2019-04-19 NOTE — Procedures (Signed)
Heart rate is too high for accurate echo at this time. 

## 2019-04-20 ENCOUNTER — Inpatient Hospital Stay (HOSPITAL_COMMUNITY): Payer: Medicare Other

## 2019-04-20 ENCOUNTER — Other Ambulatory Visit: Payer: Self-pay

## 2019-04-20 ENCOUNTER — Encounter (HOSPITAL_COMMUNITY): Payer: Self-pay | Admitting: Family Medicine

## 2019-04-20 DIAGNOSIS — I5033 Acute on chronic diastolic (congestive) heart failure: Secondary | ICD-10-CM

## 2019-04-20 DIAGNOSIS — E785 Hyperlipidemia, unspecified: Secondary | ICD-10-CM

## 2019-04-20 LAB — COMPREHENSIVE METABOLIC PANEL
ALT: 22 U/L (ref 0–44)
AST: 16 U/L (ref 15–41)
Albumin: 2.4 g/dL — ABNORMAL LOW (ref 3.5–5.0)
Alkaline Phosphatase: 68 U/L (ref 38–126)
Anion gap: 12 (ref 5–15)
BUN: 23 mg/dL (ref 8–23)
CO2: 37 mmol/L — ABNORMAL HIGH (ref 22–32)
Calcium: 7.4 mg/dL — ABNORMAL LOW (ref 8.9–10.3)
Chloride: 83 mmol/L — ABNORMAL LOW (ref 98–111)
Creatinine, Ser: 0.63 mg/dL (ref 0.44–1.00)
GFR calc Af Amer: >60 mL/min (ref >60)
GFR calc non Af Amer: >60 mL/min (ref >60)
Glucose, Bld: 210 mg/dL — ABNORMAL HIGH (ref 70–99)
Potassium: 3.2 mmol/L — ABNORMAL LOW (ref 3.5–5.1)
Sodium: 132 mmol/L — ABNORMAL LOW (ref 135–145)
Total Bilirubin: 2 mg/dL — ABNORMAL HIGH (ref 0.3–1.2)
Total Protein: 5 g/dL — ABNORMAL LOW (ref 6.5–8.1)

## 2019-04-20 LAB — HEPARIN LEVEL (UNFRACTIONATED)
Heparin Unfractionated: 0.22 IU/mL — ABNORMAL LOW (ref 0.30–0.70)
Heparin Unfractionated: 1.46 IU/mL — ABNORMAL HIGH (ref 0.30–0.70)
Heparin Unfractionated: 0.17 IU/mL — ABNORMAL LOW (ref 0.30–0.70)

## 2019-04-20 LAB — HEPATIC FUNCTION PANEL
ALT: 24 U/L (ref 0–44)
AST: 19 U/L (ref 15–41)
Albumin: 2.7 g/dL — ABNORMAL LOW (ref 3.5–5.0)
Alkaline Phosphatase: 71 U/L (ref 38–126)
Bilirubin, Direct: 0.1 mg/dL (ref 0.0–0.2)
Indirect Bilirubin: 0.6 mg/dL (ref 0.3–0.9)
Total Bilirubin: 0.7 mg/dL (ref 0.3–1.2)
Total Protein: 5.6 g/dL — ABNORMAL LOW (ref 6.5–8.1)

## 2019-04-20 LAB — CBC WITH DIFFERENTIAL/PLATELET
Abs Immature Granulocytes: 0.05 10*3/uL (ref 0.00–0.07)
Basophils Absolute: 0 10*3/uL (ref 0.0–0.1)
Basophils Relative: 0 %
Eosinophils Absolute: 0 10*3/uL (ref 0.0–0.5)
Eosinophils Relative: 0 %
HCT: 24.4 % — ABNORMAL LOW (ref 36.0–46.0)
Hemoglobin: 7.3 g/dL — ABNORMAL LOW (ref 12.0–15.0)
Immature Granulocytes: 1 %
Lymphocytes Relative: 8 %
Lymphs Abs: 0.6 10*3/uL — ABNORMAL LOW (ref 0.7–4.0)
MCH: 27.7 pg (ref 26.0–34.0)
MCHC: 29.9 g/dL — ABNORMAL LOW (ref 30.0–36.0)
MCV: 92.4 fL (ref 80.0–100.0)
Monocytes Absolute: 0.3 10*3/uL (ref 0.1–1.0)
Monocytes Relative: 4 %
Neutro Abs: 6.3 10*3/uL (ref 1.7–7.7)
Neutrophils Relative %: 87 %
Platelets: 135 10*3/uL — ABNORMAL LOW (ref 150–400)
RBC: 2.64 MIL/uL — ABNORMAL LOW (ref 3.87–5.11)
RDW: 20.2 % — ABNORMAL HIGH (ref 11.5–15.5)
WBC: 7.2 10*3/uL (ref 4.0–10.5)
nRBC: 0 % (ref 0.0–0.2)

## 2019-04-20 LAB — GLUCOSE, CAPILLARY: Glucose-Capillary: 279 mg/dL — ABNORMAL HIGH (ref 70–99)

## 2019-04-20 LAB — RETICULOCYTES
Immature Retic Fract: 14.6 % (ref 2.3–15.9)
RBC.: 3.06 MIL/uL — ABNORMAL LOW (ref 3.87–5.11)
Retic Count, Absolute: 61.2 10*3/uL (ref 19.0–186.0)
Retic Ct Pct: 2 % (ref 0.4–3.1)

## 2019-04-20 LAB — LACTATE DEHYDROGENASE: LDH: 176 U/L (ref 98–192)

## 2019-04-20 LAB — MRSA PCR SCREENING: MRSA by PCR: NEGATIVE

## 2019-04-20 LAB — PREPARE RBC (CROSSMATCH)

## 2019-04-20 LAB — MAGNESIUM: Magnesium: 1.7 mg/dL (ref 1.7–2.4)

## 2019-04-20 LAB — SAVE SMEAR(SSMR), FOR PROVIDER SLIDE REVIEW

## 2019-04-20 LAB — PHOSPHORUS: Phosphorus: 3.2 mg/dL (ref 2.5–4.6)

## 2019-04-20 LAB — DIRECT ANTIGLOBULIN TEST (NOT AT ARMC)
DAT, IgG: NEGATIVE
DAT, complement: NEGATIVE

## 2019-04-20 LAB — ABO/RH: ABO/RH(D): B POS

## 2019-04-20 MED ORDER — MAGNESIUM SULFATE 2 GM/50ML IV SOLN
2.0000 g | Freq: Once | INTRAVENOUS | Status: AC
  Administered 2019-04-20: 2 g via INTRAVENOUS
  Filled 2019-04-20 (×2): qty 50

## 2019-04-20 MED ORDER — CALCIUM GLUCONATE-NACL 1-0.675 GM/50ML-% IV SOLN
1.0000 g | Freq: Once | INTRAVENOUS | Status: AC
  Administered 2019-04-20: 1000 mg via INTRAVENOUS
  Filled 2019-04-20 (×2): qty 50

## 2019-04-20 MED ORDER — METHYLPREDNISOLONE SODIUM SUCC 40 MG IJ SOLR
40.0000 mg | Freq: Every day | INTRAMUSCULAR | Status: DC
  Administered 2019-04-20 – 2019-04-21 (×2): 40 mg via INTRAVENOUS
  Filled 2019-04-20 (×2): qty 1

## 2019-04-20 MED ORDER — POTASSIUM CHLORIDE CRYS ER 20 MEQ PO TBCR
40.0000 meq | EXTENDED_RELEASE_TABLET | Freq: Two times a day (BID) | ORAL | Status: AC
  Administered 2019-04-20 (×2): 40 meq via ORAL
  Filled 2019-04-20 (×2): qty 2

## 2019-04-20 MED ORDER — SODIUM CHLORIDE 0.9% IV SOLUTION: Freq: Once | INTRAVENOUS | Status: AC

## 2019-04-20 NOTE — Progress Notes (Signed)
Patient comfortable with stable vitals on 4L . BIPAP not needed at this time.

## 2019-04-20 NOTE — H&P (View-Only) (Signed)
[facility] Consultation Note  Referring Provider: [facility] Primary Care Physician:  Bernerd Limbo, MD  Reason for Consultation:  anemia  HPI: Patricia Wall is a 69 y.o. female admitted CHF exacerbation.  Multiple cardiopulmonary comorbidities, including home oxygen COPD and CAD.  She was found to have acute worsening of her chronic anemia.  She has mild chronic periumbilical abdominal pain; denies overt blood in stool, nausea, vomiting, hematemesis.  Reports colonoscopy 10+ years ago, unknown results.  Reports no prior endoscopy.   Past Medical History:  Diagnosis Date  . Abnormal chest xray   . Alcohol abuse   . Back pain   . CAD (coronary artery disease)     Prev seen by [facility].  Stent to OM and LAD Last cath 6/11 cathed on June 13, [name]. 2011.  The cardiac catheterization showed 30% and 50% lesions in the mid  and distal LAD, 50% in the OM1, 40% in the OM2 with 20% in-stent  restenosis, 40% in-stent restenosis in the circumflex and no significant  disease in the RCA.  Her EF was normal.  Dr. Olevia Perches recommended a trial  of proton pump inhibitors   . Chest pain   . COPD (chronic obstructive pulmonary disease) ([place])   . Cyanosis   . DDD (degenerative disc disease)   . Depression   . Diarrhea   . Diverticular disease   . GERD (gastroesophageal reflux disease)   . H/O: hysterectomy   . Hemorrhoids   . HTN (hypertension)   . IBS (irritable bowel syndrome)   . Insomnia   . Lymphadenitis   . Pharyngitis   . PUD (peptic ulcer disease)   . PVD (peripheral vascular disease) ([place])   . Sinusitis   . TIA (transient ischemic attack)   . Tobacco abuse   . Vertigo     Past Surgical History:  Procedure Laterality Date  . ABDOMINAL HYSTERECTOMY    . APPENDECTOMY    . BREAST LUMPECTOMY Left 2015  . CARDIAC CATHETERIZATION  2005, 2006, 2009,2011  . CHOLECYSTECTOMY    . CORONARY STENT PLACEMENT     Status post balloon angiogram plasty and Cypher stent to the distal    circumflex  and OM2 branch  . Hammertoe repair    . Left Bunionectomy    . LEFT HEART CATHETERIZATION WITH CORONARY ANGIOGRAM N/A 12/03/2013   Procedure: LEFT HEART CATHETERIZATION WITH CORONARY ANGIOGRAM;  Surgeon: Leonie Man, MD;  Location: [facility] CATH LAB;  Service: Cardiovascular;  Laterality: N/A;  . TUBAL LIGATION      Prior to Admission medications   Medication Sig Start Date End Date Taking? Authorizing Provider  acidophilus (RISAQUAD) CAPS capsule Take 1 capsule by mouth 2 (two) times daily. 04/09/19  Yes [provider]  albuterol (PROVENTIL) (2.5 MG/3ML) 0.083% nebulizer solution USE 1 VIAL IN NEBULIZER EVERY 4 HOURS AS NEEDED FOR WHEEZING OR SHORTNESS OF BREATH. Patient taking differently: Take 2.5 mg by nebulization every 4 (four) hours as needed for wheezing or shortness of breath. USE 1 VIAL IN NEBULIZER EVERY 4 HOURS AS NEEDED FOR WHEEZING OR SHORTNESS OF BREATH. 07/14/17  Yes Tanda Rockers, MD  albuterol (VENTOLIN HFA) 108 (90 Base) MCG/ACT inhaler Inhale 2 puffs into the lungs every 6 (six) hours as needed for wheezing or shortness of breath. 07/13/17  Yes Tanda Rockers, MD  atorvastatin (LIPITOR) 80 MG tablet Take 80 mg by mouth daily.   Yes [provider]  budesonide-formoterol (SYMBICORT) 160-4.5 MCG/ACT inhaler Inhale 2 puffs into  the lungs 2 (two) times daily. 10/26/16  Yes Parrett, Tammy S, NP  butalbital-acetaminophen-caffeine (FIORICET, ESGIC) 50-325-40 MG tablet Take 1-1.5 tablets by mouth every 8 (eight) hours as needed for headache or migraine. 1 - 1 and 1/2 every 8 hours as needed for headache    Yes [provider]  calcium carbonate (OS-CAL) 600 MG TABS Take 600 mg by mouth every morning.    Yes [provider]  Cholecalciferol (VITAMIN D) 2000 UNITS tablet Take 2,000 Units by mouth every morning.    Yes [provider]  Dextromethorphan-Guaifenesin ([place] FAST-MAX DM MAX) 5-100 MG/5ML LIQD Take 5 mLs by mouth every 4 (four) hours  as needed (cough/ congestiom). 1 tsp every 4 hours as needed for cough/congestion    Yes [provider]  Diclofenac Potassium (CAMBIA) 50 MG PACK Take 50 mg by mouth as directed. Take at onset of headache as needed    Yes [provider]  diltiazem (CARDIZEM CD) 120 MG 24 hr capsule Take 120 mg by mouth daily.  04/05/19  Yes [provider]  donepezil (ARICEPT) 5 MG tablet Take 5 mg by mouth daily.  01/10/18  Yes [provider]  DULoxetine (CYMBALTA) 60 MG capsule Take 60 mg by mouth every morning.    Yes [provider]  Erenumab-aooe (AIMOVIG Marissa) Inject 1 Dose into the skin as directed.    Yes [provider]  furosemide (LASIX) 40 MG tablet Take 1 tablet by mouth 2 (two) times daily. 04/07/19  Yes [provider]  gabapentin (NEURONTIN) 300 MG capsule Take 300 mg by mouth 3 (three) times daily.    Yes [provider]  letrozole (FEMARA) 2.5 MG tablet Take 2.5 mg by mouth every morning.    Yes [provider]  losartan (COZAAR) 25 MG tablet Take 25 mg by mouth daily.  04/05/19  Yes [provider]  metFORMIN (GLUCOPHAGE) 1000 MG tablet Take 1,000 mg by mouth 2 (two) times daily with a meal.   Yes [provider]  methocarbamol (ROBAXIN) 750 MG tablet Take 750 mg by mouth every 8 (eight) hours as needed for muscle spasms.   Yes [provider]  metoprolol succinate (TOPROL-XL) 25 MG 24 hr tablet Take 25 mg by mouth daily.  04/05/19  Yes [provider]  Multiple Vitamin (MULTIVITAMIN) tablet Take 1 tablet by mouth daily.    Yes [provider]  nitroGLYCERIN (NITROSTAT) 0.4 MG SL tablet Place 1 tablet (0.4 mg total) under the tongue every 5 (five) minutes as needed for chest pain (may repeat x3). 02/02/18  Yes Josue Hector, MD  omeprazole (PRILOSEC) 40 MG capsule Take 40 mg by mouth daily before breakfast.    Yes [provider]  ondansetron (ZOFRAN) 8 MG tablet  Take 8 mg by mouth every 4 (four) hours as needed for nausea.    Yes [provider]  OXYGEN Use 4L 24/7   Yes [provider]  potassium chloride SA (KLOR-CON M20) 20 MEQ tablet Take 20 mEq by mouth 2 (two) times daily.  04/05/19  Yes [provider]  Respiratory Therapy Supplies (FLUTTER) DEVI Use as directed 07/13/17  Yes Tanda Rockers, MD  Tiotropium Bromide Monohydrate (SPIRIVA RESPIMAT) 2.5 MCG/ACT AERS Inhale 2 puffs into the lungs daily. 10/26/16  Yes Parrett, Tammy S, NP  acetaminophen-codeine (TYLENOL #3) 300-30 MG tablet TAKE 1 TABLET EVERY 4 HOURS AS NEEDED FOR COUGH Patient not taking: Reported on 04/18/2019 03/02/17   Melvyn Novas,  Christena Deem, MD  blood glucose meter kit and supplies Dispense based on patient and insurance preference. Use up to four times daily as directed. (FOR ICD-9 250.00, 250.01). 01/13/15   Reyne Dumas, MD  Lancets (FREESTYLE) lancets Use as instructed 01/13/15   Reyne Dumas, MD    Current Facility-Administered Medications  Medication Dose Route Frequency Provider Last Rate Last Admin  . atorvastatin (LIPITOR) tablet 80 mg  80 mg Oral Daily Raiford Noble Martin, DO   80 mg at 04/20/19 1039  . calcium gluconate 1 g/ 50 mL sodium chloride IVPB  1 g Intravenous Once Raiford Noble Latif, DO 50 mL/hr at 04/20/19 1328 1,000 mg at 04/20/19 1328  . ceFEPIme (MAXIPIME) 2 g in sodium chloride 0.9 % 100 mL IVPB  2 g Intravenous Q8H Tu, Ching T, DO 200 mL/hr at 04/20/19 0623 2 g at 04/20/19 0623  . diltiazem (CARDIZEM CD) 24 hr capsule 180 mg  180 mg Oral Daily Fransico Him R, MD   180 mg at 04/20/19 1039  . donepezil (ARICEPT) tablet 5 mg  5 mg Oral QHS Sheikh, Georgina Quint [place], DO   5 mg at 04/19/19 2157  . DULoxetine (CYMBALTA) DR capsule 60 mg  60 mg Oral Daily Raiford Noble Latif, DO   60 mg at 04/20/19 1038  . furosemide (LASIX) injection 40 mg  40 mg Intravenous Q12H Sueanne Margarita, MD   40 mg at 04/20/19 1321  . gabapentin (NEURONTIN) capsule 300 mg   300 mg Oral TID Raiford Noble Latif, DO   300 mg at 04/20/19 1038  . heparin ADULT infusion 100 units/mL (25000 units/277m sodium chloride 0.45%)  1,100 Units/hr Intravenous Continuous LErenest Blank RPH 11 mL/hr at 04/20/19 1311 1,100 Units/hr at 04/20/19 1311  . ipratropium (ATROVENT) nebulizer solution 0.5 mg  0.5 mg Nebulization TID SRaiford NobleLatif, DO   0.5 mg at 04/20/19 0730  . letrozole ([facility] tablet 2.5 mg  2.5 mg Oral Daily SRaiford NobleLDonnellson DO   2.5 mg at 04/20/19 1039  . levalbuterol (XOPENEX) nebulizer solution 0.63 mg  0.63 mg Nebulization TID SRaiford NobleLatif, DO   0.63 mg at 04/20/19 0730  . losartan (COZAAR) tablet 25 mg  25 mg Oral Daily SRaiford NobleLMarks DO   25 mg at 04/20/19 1039  . magnesium sulfate IVPB 2 g 50 mL  2 g Intravenous Once SRaiford NobleLatif, DO 50 mL/hr at 04/20/19 1324 2 g at 04/20/19 1324  . methocarbamol (ROBAXIN) tablet 750 mg  750 mg Oral Q8H PRN Sheikh, OGeorgina QuintLatif, DO      . methylPREDNISolone sodium succinate (SOLU-MEDROL) 40 mg/mL injection 40 mg  40 mg Intravenous Daily SRaiford NobleLatif, DO   40 mg at 04/20/19 1040  . mometasone-formoterol (DULERA) 200-5 MCG/ACT inhaler 2 puff  2 puff Inhalation BID SRaiford NobleLCold [place] DO   2 puff at 04/20/19 0730  . nitroGLYCERIN (NITROSTAT) SL tablet 0.4 mg  0.4 mg Sublingual Q5 min PRN Sheikh, Omair Latif, DO      . pantoprazole (PROTONIX) EC tablet 40 mg  40 mg Oral Daily SRaiford NobleLTrinity DO   40 mg at 04/20/19 1038  . potassium chloride SA (KLOR-CON) CR tablet 40 mEq  40 mEq Oral BID SRaiford NobleLMorgan DO   40 mEq at 04/20/19 1038  . sodium chloride flush (NS) 0.9 % injection 3 mL  3 mL Intravenous Once FDeno Etienne DO      . sodium chloride flush (NS) 0.9 %  injection 3 mL  3 mL Intravenous Q12H Furth, Cadence H, PA-C   3 mL at 04/20/19 1041  . traMADol (ULTRAM) tablet 50 mg  50 mg Oral Q6H PRN Sheikh, Omair Latif, DO      . vancomycin (VANCOREADY) IVPB 750 mg/150 mL  750 mg Intravenous  Q12H Tu, Ching T, DO 150 mL/hr at 04/20/19 0413 750 mg at 04/20/19 0413    Allergies as of 04/17/2019  . (No Known Allergies)    Family History  Problem Relation Age of Onset  . Heart disease Mother   . Rheumatologic disease Mother   . Cancer - Other Mother        Uterine  . Emphysema Father   . Heart disease Father   . Cancer - Other Father   . Coronary artery disease Other   . Asthma Sister        2 sisters  . Stroke Sister     Social History   Socioeconomic History  . Marital status: Legally Separated    Spouse name: Not on file  . Number of children: Not on file  . Years of education: Not on file  . Highest education level: Not on file  Occupational History  . Not on file  Tobacco Use  . Smoking status: Current Every Day Smoker    Packs/day: 2.00    Years: 52.00    Pack years: 104.00    Types: Cigarettes  . Smokeless tobacco: Never Used  . Tobacco comment: down to 0.5ppd   Substance and Sexual Activity  . Alcohol use: No    Alcohol/week: 0.0 standard drinks  . Drug use: Yes    Comment: Seldom Marijuana use- 1Xmonth  . Sexual activity: Not on file  Other Topics Concern  . Not on file  Social History Narrative  . Not on file   Social Determinants of Health   Financial Resource Strain:   . Difficulty of Paying Living Expenses: Not on file  Food Insecurity:   . Worried About Charity fundraiser in the Last Year: Not on file  . Ran Out of Food in the Last Year: Not on file  Transportation Needs:   . Lack of Transportation (Medical): Not on file  . Lack of Transportation (Non-Medical): Not on file  Physical Activity:   . Days of Exercise per Week: Not on file  . Minutes of Exercise per Session: Not on file  Stress:   . Feeling of Stress : Not on file  Social Connections:   . Frequency of Communication with Friends and Family: Not on file  . Frequency of Social Gatherings with Friends and Family: Not on file  . Attends Religious Services: Not on file   . Active Member of Clubs or Organizations: Not on file  . Attends Archivist Meetings: Not on file  . Marital Status: Not on file  Intimate Partner Violence:   . Fear of Current or Ex-Partner: Not on file  . Emotionally Abused: Not on file  . Physically Abused: Not on file  . Sexually Abused: Not on file    Review of Systems: As per HPI, all others negative  Physical Exam: Vital signs in last 24 hours: Temp:  [97.6 F (36.4 C)-98.7 F (37.1 C)] 98.2 F (36.8 C) (01/30 1133) Pulse Rate:  [73-122] 93 (01/30 1138) Resp:  [14-33] 21 (01/30 1133) BP: (100-169)/(54-89) 116/59 (01/30 1133) SpO2:  [92 %-100 %] 97 % (01/30 1138) Weight:  [63.2 kg] 63.2 kg (01/30  7847) Last BM Date: 04/19/19 General:  Chronically ill-appearing, but NAD Head:  Normocephalic and atraumatic. Eyes:  Sclera clear, no icterus.   Conjunctiva pink. Ears:  Normal auditory acuity. Nose:  No deformity, discharge,  or lesions. Mouth:  No deformity or lesions.  Oropharynx pink & moist. Neck:  Supple; no masses or thyromegaly. Lungs:  Poor aeration bibasilar.   No wheezes, crackles, or rhonchi. No acute distress. Heart:  Regular rate and rhythm; no murmurs, clicks, rubs,  or gallops. Abdomen:  Soft, nontender and nondistended. No masses, hepatosplenomegaly or hernias noted. Normal bowel sounds, without guarding, and without rebound.     Msk:  Symmetrical without gross deformities. Normal posture. Pulses:  Normal pulses noted. Extremities:  Without clubbing, 1+ PE BLE Neurologic:  Alert and  oriented x4;  grossly normal neurologically. Skin:  Scattered ecchymoses and telangiectasias, otherwise Intact without significant lesions or rashes. Cervical Nodes:  No significant cervical adenopathy. Psych:  Alert and cooperative. Normal mood and affect.   Lab Results: Recent Labs    04/18/19 0608 04/19/19 0702 04/20/19 0509  WBC 4.5 6.8 7.2  HGB 8.4* 8.2* 7.3*  HCT 29.5* 27.1* 24.4*  PLT 124* 158 135*    BMET Recent Labs    04/18/19 1826 04/19/19 0515 04/20/19 0509  NA 143 143 132*  K 4.2 4.3 3.2*  CL 88* 90* 83*  CO2 42* 44* 37*  GLUCOSE 183* 179* 210*  BUN 24* 28* 23  CREATININE 0.77 0.73 0.63  CALCIUM 8.8* 8.8* 7.4*   LFT Recent Labs    04/20/19 1150  PROT 5.6*  ALBUMIN 2.7*  AST 19  ALT 24  ALKPHOS 71  BILITOT 0.7  BILIDIR 0.1  IBILI 0.6   PT/INR No results for input(s): LABPROT, INR in the last 72 hours.  Impression:  1.  Acute on chronic anemia.  Suspect chronic disease.  No overt Gi bleeding. 2.  Heart failure exacerbation. 3.  CAD. 4.  O2-requiring COPD.  Plan:  1.  Cardiology requests work-up for anemia prior to heart catheterization. 2.  Will proceed with endoscopy tomorrow (which has the highest yield in such situations), so long as felt not too high risk by anesthesia.  If endoscopy is negative, I will discuss with cardiology how much further work-up for anemia would be needed prior to catheterization, in this patient with acute on chronic anemia without evidence of overt GI bleeding. 3.  Risks (bleeding, infection, bowel perforation that could require surgery, sedation-related changes in cardiopulmonary systems), benefits (identification and possible treatment of source of symptoms, exclusion of certain causes of symptoms), and alternatives (watchful waiting, radiographic imaging studies, empiric medical treatment) of upper endoscopy (EGD) were explained to patient/family in detail and patient wishes to proceed. 4.  PPI. 5.  NPO after midnight. 6.  Hold heparin gtt 4 hours pre-endoscopy. 7.  [facility] will follow.   LOS: 3 days   Shawnya Mayor M  04/20/2019, 1:37 PM  Cell [phone] If no answer or after 5 PM call [phone]

## 2019-04-20 NOTE — Consult Note (Addendum)
[place]  Telephone:([phone]) [phone]   HEMATOLOGY ONCOLOGY INPATIENT CONSULTATION   Patricia Wall  DOB: 1950-09-05  MR#: [id number]  CSN#: [id number]    Requesting Physician: [facility] Dr. Alfredia Ferguson   Patient Care Team: Bernerd Limbo, MD as PCP - General (Family Medicine) Josue Hector, MD as PCP - Cardiology (Cardiology) Vicenta Aly, [place] as Nurse Practitioner (Nurse Practitioner)  Reason for consult: anemia   History of present illness:   69 yo female with history of chronic anemia, coronary artery disease with multiple stenting, PVD, COPD on home oxygen, was admitted for confusion and COPD exacerbation.  I was called to evaluate her anemia.  Per patient, she has been anemic for many years.  Per epic record, she developed mild anemia since 2015, hemoglobin has been in 10-12 range until her recent hospital admission.  When she was admitted on April 15, 2019, her hemoglobin was 8.2, and Hg has been fluctuating between 8.2-10.2 since admission, and dropped tp 7.3 this morning, she denies any bleeding, no melena or hematochezia.  Last bowel movement was normal yesterday.  MEDICAL HISTORY:  Past Medical History:  Diagnosis Date  . Abnormal chest xray   . Alcohol abuse   . Back pain   . CAD (coronary artery disease)     Prev seen by [facility].  Stent to OM and LAD Last cath 6/11 cathed on June 13, [name]. 2011.  The cardiac catheterization showed 30% and 50% lesions in the mid  and distal LAD, 50% in the OM1, 40% in the OM2 with 20% in-stent  restenosis, 40% in-stent restenosis in the circumflex and no significant  disease in the RCA.  Her EF was normal.  Dr. Olevia Perches recommended a trial  of proton pump inhibitors   . Chest pain   . COPD (chronic obstructive pulmonary disease) ([place])   . Cyanosis   . DDD (degenerative disc disease)   . Depression   . Diarrhea   . Diverticular disease   . GERD (gastroesophageal reflux disease)   . H/O: hysterectomy   .  Hemorrhoids   . HTN (hypertension)   . IBS (irritable bowel syndrome)   . Insomnia   . Lymphadenitis   . Pharyngitis   . PUD (peptic ulcer disease)   . PVD (peripheral vascular disease) ([place])   . Sinusitis   . TIA (transient ischemic attack)   . Tobacco abuse   . Vertigo     SURGICAL HISTORY: Past Surgical History:  Procedure Laterality Date  . ABDOMINAL HYSTERECTOMY    . APPENDECTOMY    . BREAST LUMPECTOMY Left 2015  . CARDIAC CATHETERIZATION  2005, 2006, 2009,2011  . CHOLECYSTECTOMY    . CORONARY STENT PLACEMENT     Status post balloon angiogram plasty and Cypher stent to the distal    circumflex and OM2 branch  . Hammertoe repair    . Left Bunionectomy    . LEFT HEART CATHETERIZATION WITH CORONARY ANGIOGRAM N/A 12/03/2013   Procedure: LEFT HEART CATHETERIZATION WITH CORONARY ANGIOGRAM;  Surgeon: Leonie Man, MD;  [facility] C[facility] LAB;  Service: Cardiovascular;  Laterality: N/A;  . TUBAL LIGATION      SOCIAL HISTORY: Social History   Socioeconomic History  . Marital status: Legally Separated    Spouse name: Not on file  . Number of children: Not on file  . Years of education: Not on file  . Highest education level: Not on file  Occupational History  . Not on file  Tobacco  Use  . Smoking status: Current Every Day Smoker    Packs/day: 2.00    Years: 52.00    Pack years: 104.00    Types: Cigarettes  . Smokeless tobacco: Never Used  . Tobacco comment: down to 0.5ppd   Substance and Sexual Activity  . Alcohol use: No    Alcohol/week: 0.0 standard drinks  . Drug use: Yes    Comment: Seldom Marijuana use- 1Xmonth  . Sexual activity: Not on file  Other Topics Concern  . Not on file  Social History Narrative  . Not on file   Social Determinants of Health   Financial Resource Strain:   . Difficulty of Paying Living Expenses: Not on file  Food Insecurity:   . Worried About Charity fundraiser in the Last Year: Not on file  . Ran Out of Food in the Last  Year: Not on file  Transportation Needs:   . Lack of Transportation (Medical): Not on file  . Lack of Transportation (Non-Medical): Not on file  Physical Activity:   . Days of Exercise per Week: Not on file  . Minutes of Exercise per Session: Not on file  Stress:   . Feeling of Stress : Not on file  Social Connections:   . Frequency of Communication with Friends and Family: Not on file  . Frequency of Social Gatherings with Friends and Family: Not on file  . Attends Religious Services: Not on file  . Active Member of Clubs or Organizations: Not on file  . Attends Archivist Meetings: Not on file  . Marital Status: Not on file  Intimate Partner Violence:   . Fear of Current or Ex-Partner: Not on file  . Emotionally Abused: Not on file  . Physically Abused: Not on file  . Sexually Abused: Not on file    FAMILY HISTORY: Family History  Problem Relation Age of Onset  . Heart disease Mother   . Rheumatologic disease Mother   . Cancer - Other Mother        Uterine  . Emphysema Father   . Heart disease Father   . Cancer - Other Father   . Coronary artery disease Other   . Asthma Sister        2 sisters  . Stroke Sister     ALLERGIES:  has No Known Allergies.  MEDICATIONS:  Current Facility-Administered Medications  Medication Dose Route Frequency Provider Last Rate Last Admin  . atorvastatin (LIPITOR) tablet 80 mg  80 mg Oral Daily Raiford Noble [place], DO   80 mg at 04/20/19 1039  . calcium gluconate 1 g/ 50 mL sodium chloride IVPB  1 g Intravenous Once Sheikh, Omair Latif, DO      . ceFEPIme (MAXIPIME) 2 g in sodium chloride 0.9 % 100 mL IVPB  2 g Intravenous Q8H Tu, Ching T, DO 200 mL/hr at 04/20/19 0623 2 g at 04/20/19 4235  . diltiazem (CARDIZEM CD) 24 hr capsule 180 mg  180 mg Oral Daily Fransico Him R, MD   180 mg at 04/20/19 1039  . donepezil (ARICEPT) tablet 5 mg  5 mg Oral QHS Sheikh, Georgina Quint [place], DO   5 mg at 04/19/19 2157  . DULoxetine (CYMBALTA) DR  capsule 60 mg  60 mg Oral Daily Raiford Noble Latif, DO   60 mg at 04/20/19 1038  . furosemide (LASIX) injection 40 mg  40 mg Intravenous Q12H Sueanne Margarita, MD   Stopped at 04/20/19 1039  . gabapentin (NEURONTIN)  capsule 300 mg  300 mg Oral TID Raiford Noble [place], DO   300 mg at 04/20/19 1038  . heparin ADULT infusion 100 units/mL (25000 units/266m sodium chloride 0.45%)  1,100 Units/hr Intravenous Continuous LErenest Blank RPH 11 mL/hr at 04/20/19 1311 1,100 Units/hr at 04/20/19 1311  . ipratropium (ATROVENT) nebulizer solution 0.5 mg  0.5 mg Nebulization TID SRaiford NobleLatif, DO   0.5 mg at 04/20/19 0730  . letrozole ([facility] tablet 2.5 mg  2.5 mg Oral Daily SRaiford NobleLLake [place] DO   2.5 mg at 04/20/19 1039  . levalbuterol (XOPENEX) nebulizer solution 0.63 mg  0.63 mg Nebulization TID SRaiford NobleLatif, DO   0.63 mg at 04/20/19 0730  . losartan (COZAAR) tablet 25 mg  25 mg Oral Daily [place]aiford Noble[place] DO   25 mg at 04/20/19 1039  . magnesium sulfate IVPB 2 g 50 mL  2 g Intravenous Once Sheikh, Omair Latif, DO      . methocarbamol (ROBAXIN) tablet 750 mg  750 mg Oral Q8H PRN Sheikh, Omair Latif, DO      . methylPREDNISolone sodium succinate (SOLU-MEDROL) 40 mg/mL injection 40 mg  40 mg Intravenous Daily SRaiford NobleLatif, DO   40 mg at 04/20/19 1040  . mometasone-formoterol (DULERA) 200-5 MCG/ACT inhaler 2 puff  2 puff Inhalation BID SRaiford NobleLLandis DO   2 puff at 04/20/19 0730  . nitroGLYCERIN (NITROSTAT) SL tablet 0.4 mg  0.4 mg Sublingual Q5 min PRN Sheikh, Omair Latif, DO      . pantoprazole (PROTONIX) EC tablet 40 mg  40 mg Oral Daily SRaiford NobleLSan Ild[place] DO   40 mg at 04/20/19 1038  . potassium chloride SA (KLOR-CON) CR tablet 40 mEq  40 mEq Oral BID SRaiford NobleL[place] DO   40 mEq at 04/20/19 1038  . sodium chloride flush (NS) 0.9 % injection 3 mL  3 mL Intravenous Once FDeno Etienne DO      . sodium chloride flush (NS) 0.9 % injection 3 mL  3 mL Intravenous Q12H Furth,  Cadence H, PA-C   3 mL at 04/20/19 1041  . traMADol (ULTRAM) tablet 50 mg  50 mg Oral Q6H PRN Sheikh, Omair Latif, DO      . vancomycin (VANCOREADY) IVPB 750 mg/150 mL  750 mg Intravenous Q12H Tu, Ching T, DO 150 mL/hr at 04/20/19 0413 750 mg at 04/20/19 0413    REVIEW OF SYSTEMS:   Constitutional: Denies fevers, chills or abnormal night sweats Eyes: Denies blurriness of vision, double vision or watery eyes Ears, nose, mouth, throat, and face: Denies mucositis or sore throat Respiratory: Denies cough, dyspnea or wheezes Cardiovascular: Denies palpitation, chest discomfort or lower extremity swelling Gastrointestinal:  Denies nausea, heartburn or change in bowel habits Skin: Denies abnormal skin rashes Lymphatics: Denies new lymphadenopathy or easy bruising Neurological:Denies numbness, tingling or new weaknesses Behavioral/Psych: Mood is stable, no new changes  All other systems were reviewed with the patient and are negative.  PHYSICAL EXAMINATION: ECOG PERFORMANCE STATUS: 3 - Symptomatic, >50% confined to bed  Vitals:   04/20/19 1137 04/20/19 1138  BP:    Pulse: 94 93  Resp:    Temp:    SpO2: 97% 97%   Filed Weights   04/19/19 0201 04/20/19 0554  Weight: 138 lb 7.2 oz (62.8 kg) 139 lb 5.3 oz (63.2 kg)    GENERAL:alert, no distress and comfortable SKIN: skin color, texture, turgor are normal, no rashes or significant lesions EYES: normal, conjunctiva are pink  and non-injected, sclera clear OROPHARYNX:no exudate, no erythema and lips, buccal mucosa, and tongue normal  NECK: supple, thyroid normal size, non-tender, without nodularity LYMPH:  no palpable lymphadenopathy in the cervical, axillary or inguinal LUNGS: clear to auscultation and percussion with normal breathing effort HEART: regular rate & rhythm and no murmurs and no lower extremity edema ABDOMEN:abdomen soft, non-tender and normal bowel sounds Musculoskeletal:no cyanosis of digits and no clubbing  PSYCH: alert &  oriented x 3 with fluent speech NEURO: no focal motor/sensory deficits  LABORATORY DATA:  I have reviewed the data as listed Lab Results  Component Value Date   WBC 7.2 04/20/2019   HGB 7.3 (L) 04/20/2019   HCT 24.4 (L) 04/20/2019   MCV 92.4 04/20/2019   PLT 135 (L) 04/20/2019   Recent Labs    04/15/19 1225 04/17/19 1620 04/18/19 1826 04/18/19 1826 04/19/19 0515 04/20/19 0509 04/20/19 1150  NA 142   < > 143  --  143 132*  --   K 4.5   < > 4.2  --  4.3 3.2*  --   CL 90*   < > 88*  --  90* 83*  --   CO2 39*   < > 42*  --  44* 37*  --   GLUCOSE 206*   < > 183*  --  179* 210*  --   BUN 20   < > 24*  --  28* 23  --   CREATININE 0.72   < > 0.77  --  0.73 0.63  --   CALCIUM 9.4   < > 8.8*  --  8.8* 7.4*  --   GFRNONAA 85   < > >60  --  >60 >60  --   GFRAA 98   < > >60  --  >60 >60  --   PROT 5.5*  --  6.0*   < > 6.0* 5.0* 5.6*  ALBUMIN 3.4*  --  2.9*   < > 3.0* 2.4* 2.7*  AST 18  --  17   < > 18 16 19   ALT 32  --  27   < > 27 22 24   ALKPHOS 82  --  81   < > 75 68 71  BILITOT 0.4  --  0.9   < > 0.8 2.0* 0.7  BILIDIR 0.20  --   --   --   --   --  0.1  IBILI  --   --   --   --   --   --  0.6   < > = values in this interval not displayed.    RADIOGRAPHIC STUDIES: I have personally reviewed the radiological images as listed and agreed with the findings in the report. DG Chest 2 View  Result Date: 04/17/2019 CLINICAL DATA:  Weakness and chest pain. EXAM: CHEST - 2 VIEW COMPARISON:  April 04, 2019 FINDINGS: The lungs are hyperinflated. Very mild, hazy left basilar atelectasis and/or infiltrate is noted. This represents a new finding when compared to the prior study. There is no evidence of a pleural effusion or pneumothorax. The heart size and mediastinal contours are within normal limits. There is moderate severity calcification of the aortic arch. The visualized skeletal structures are unremarkable. IMPRESSION: 1. New mild, hazy left basilar atelectasis and/or infiltrate.  Electronically Signed   By: Virgina Norfolk M.D.   On: 04/17/2019 16:53   DG CHEST PORT 1 VIEW  Result Date: 04/20/2019 CLINICAL DATA:  Shortness of breath EXAM:  PORTABLE CHEST 1 VIEW COMPARISON:  04/19/2019 FINDINGS: Heart size remains normal. Chronic aortic atherosclerosis. Chronic changes of upper lung predominant emphysema with relative upper lung lucency. Slight increase in interstitial prominence in the lower lungs suggest mild interstitial edema. No alveolar edema. No consolidation, collapse or effusion. IMPRESSION: Slight increased prominence of interstitial markings in the lower lungs suggests interstitial edema. Background emphysema. Aortic atherosclerosis. Electronically Signed   By: Nelson Chimes M.D.   On: 04/20/2019 07:46   DG CHEST PORT 1 VIEW  Result Date: 04/19/2019 CLINICAL DATA:  Shortness of breath, COPD, BiPAP EXAM: PORTABLE CHEST 1 VIEW COMPARISON:  Radiograph 04/17/2019 FINDINGS: Chronic hyperinflation. Increasing hazy reticular opacities in the lower lungs. Could reflect developing edema given septal thickening and some cephalized vascularity. No pneumothorax or visible effusion. Cardiomediastinal contours are stable from prior with a calcified aorta. Telemetry leads overlie the chest. No acute osseous or soft tissue abnormality. Degenerative changes are present in the imaged spine and shoulders. IMPRESSION: Increasing basilar interstitial opacities, suspect developing edema. Electronically Signed   By: Lovena Le M.D.   On: 04/19/2019 06:36   ECHOCARDIOGRAM COMPLETE  Result Date: 04/19/2019   ECHOCARDIOGRAM REPORT   Patient Name:   DEBORAHANN POTEAT Date of Exam: 04/19/2019 Medical Rec #:  [id number]       Height:       66.0 in Accession #:    [id number]      Weight:       138.4 lb Date of Birth:  April 08, 1950       BSA:          1.71 m Patient Age:    89 years        BP:           125/61 mmHg Patient Gender: F               HR:           112 bpm. Exam Location:  Inpatient  Procedure: 2D Echo, Cardiac Doppler and Color Doppler Indications:    R06.02 SOB  History:        Patient has prior history of Echocardiogram examinations, most                 recent 01/09/2015. Abnormal ECG, COPD and TIA,                 Signs/Symptoms:Shortness of Breath and Dyspnea; Risk                 Factors:Hypertension and Current Smoker. ETOH. Cyanosis.  Sonographer:    Roseanna Rainbow RDCS Referring Phys: [id number] [facility] LATIF [place]  1. Left ventricular ejection fraction, by visual estimation, is 60 to 65%. The left ventricle has normal function. There is mildly increased left ventricular hypertrophy.  2. Left ventricular diastolic parameters are consistent with Grade I diastolic dysfunction (impaired relaxation).  3. The left ventricle has no regional wall motion abnormalities.  4. Global right ventricle has normal systolic function.The right ventricular size is normal. No increase in right ventricular wall thickness.  5. Left atrial size was normal.  6. Right atrial size was normal.  7. The mitral valve is normal in structure. No evidence of mitral valve regurgitation.  8. The tricuspid valve is grossly normal.  9. The tricuspid valve is grossly normal. Tricuspid valve regurgitation is trivial. 10. The aortic valve is normal in structure. Aortic valve regurgitation is not visualized. 11. The pulmonic valve was normal in structure. Pulmonic valve regurgitation  is not visualized. 12. Moderately elevated pulmonary artery systolic pressure. 13. Pulmonary hypertension is moderate. 14. The atrial septum is grossly normal. FINDINGS  Left Ventricle: Left ventricular ejection fraction, by visual estimation, is 60 to 65%. The left ventricle has normal function. The left ventricle has no regional wall motion abnormalities. There is mildly increased left ventricular hypertrophy. Left ventricular diastolic parameters are consistent with Grade I diastolic dysfunction (impaired relaxation). Right Ventricle: The  right ventricular size is normal. No increase in right ventricular wall thickness. Global RV systolic function is has normal systolic function. The tricuspid regurgitant velocity is 2.76 m/s, and with an assumed right atrial pressure  of 15 mmHg, the estimated right ventricular systolic pressure is moderately elevated at 45.5 mmHg. Left Atrium: Left atrial size was normal in size. Right Atrium: Right atrial size was normal in size Pericardium: There is no evidence of pericardial effusion. Mitral Valve: The mitral valve is normal in structure. No evidence of mitral valve regurgitation. Tricuspid Valve: The tricuspid valve is grossly normal. Tricuspid valve regurgitation is trivial. Aortic Valve: The aortic valve is normal in structure. Aortic valve regurgitation is not visualized. Pulmonic Valve: The pulmonic valve was normal in structure. Pulmonic valve regurgitation is not visualized. Pulmonic regurgitation is not visualized. Aorta: The aortic root and ascending aorta are structurally normal, with no evidence of dilitation. Pulmonary Artery: Pulmonary hypertension is moderate. IAS/Shunts: The atrial septum is grossly normal.  LEFT VENTRICLE PLAX 2D LVIDd:         4.50 cm       Diastology LVIDs:         3.10 cm       LV e' lateral:   5.87 cm/s LV PW:         1.40 cm       LV E/e' lateral: 6.7 LV IVS:        1.20 cm       LV e' medial:    5.44 cm/s LVOT diam:     1.90 cm       LV E/e' medial:  7.2 LV SV:         55 ml LV SV Index:   31.84 LVOT Area:     2.84 cm  LV Volumes (MOD) LV area d, A2C:    18.10 cm LV area d, A4C:    20.00 cm LV area s, A2C:    10.50 cm LV area s, A4C:    10.80 cm LV major d, A2C:   6.61 cm LV major d, A4C:   6.16 cm LV major s, A2C:   5.20 cm LV major s, A4C:   5.33 cm LV vol d, MOD A2C: 41.0 ml LV vol d, MOD A4C: 52.7 ml LV vol s, MOD A2C: 17.2 ml LV vol s, MOD A4C: 18.0 ml LV SV MOD A2C:     23.8 ml LV SV MOD A4C:     52.7 ml LV SV MOD BP:      30.4 ml RIGHT VENTRICLE             IVC  RV S prime:     12.10 cm/s  IVC diam: 2.10 cm TAPSE (M-mode): 1.8 cm LEFT ATRIUM             Index LA diam:        2.80 cm 1.64 cm/m LA Vol (A2C):   28.1 ml 16.43 ml/m LA Vol (A4C):   20.6 ml 12.04 ml/m LA Biplane Vol: 23.8 ml 13.[place]  ml/m  AORTIC VALVE LVOT Vmax:   90.80 cm/s LVOT Vmean:  63.900 cm/s LVOT VTI:    0.153 m  AORTA Ao Root diam: 3.10 cm MITRAL VALVE                        TRICUSPID VALVE MV Area (PHT): 5.27 cm             TR Peak grad:   30.5 mmHg MV PHT:        41.76 msec           TR Vmax:        291.00 cm/s MV Decel Time: 144 msec MV E velocity: 39.34 cm/s 103 cm/s  SHUNTS MV A velocity: 76.60 cm/s 70.3 cm/s Systemic VTI:  0.15 m MV E/A ratio:  0.51       1.5       Systemic Diam: 1.90 cm  Mertie Moores MD Electronically signed by Mertie Moores MD Signature Date/Time: 04/19/2019/5:07:55 PM    Final    VAS [place] LOWER EXTREMITY VENOUS (DVT)  Result Date: 04/18/2019  Lower Venous Study Indications: Swelling, and Edema.  Risk Factors: History of CAD, PVD and COPD. Performing Technologist: Oda Cogan RDMS, RVT  Examination Guidelines: A complete evaluation includes B-mode imaging, spectral Doppler, color Doppler, and power Doppler as needed of all accessible portions of each vessel. Bilateral testing is considered an integral part of a complete examination. Limited examinations for reoccurring indications may be performed as noted.  +-----+---------------+---------+-----------+----------+--------------+ RIGHTCompressibilityPhasicitySpontaneityPropertiesThrombus Aging +-----+---------------+---------+-----------+----------+--------------+ CFV  Full           Yes      Yes                                 +-----+---------------+---------+-----------+----------+--------------+ SFJ  Full                                                        +-----+---------------+---------+-----------+----------+--------------+    +---------+---------------+---------+-----------+----------+--------------+ LEFT     CompressibilityPhasicitySpontaneityPropertiesThrombus Aging +---------+---------------+---------+-----------+----------+--------------+ CFV      Full           Yes      Yes                                 +---------+---------------+---------+-----------+----------+--------------+ SFJ      Full                                                        +---------+---------------+---------+-----------+----------+--------------+ FV Prox  Full                                                        +---------+---------------+---------+-----------+----------+--------------+ FV Mid   Full                                                        +---------+---------------+---------+-----------+----------+--------------+  FV DistalFull                                                        +---------+---------------+---------+-----------+----------+--------------+ PFV      Full                                                        +---------+---------------+---------+-----------+----------+--------------+ POP      Full           Yes      Yes                                 +---------+---------------+---------+-----------+----------+--------------+ PTV      Full                                                        +---------+---------------+---------+-----------+----------+--------------+ PERO     Full                                                        +---------+---------------+---------+-----------+----------+--------------+     Summary: Right: No evidence of common femoral vein obstruction. Left: There is no evidence of deep vein thrombosis in the lower extremity. No cystic structure found in the popliteal fossa.  *See table(s) above for measurements and observations. Electronically signed by Monica Martinez MD on 04/18/2019 at 4:12:37 PM.    Final     ASSESSMENT &  PLAN: 69 yo female   1. COPD exacerbation 2.  Acute on chronic respiratory failure with hypoxia and hypercapnia. 3.  Acute metabolic encephalopathy improved 4.  Acute on chronic CHF, AF not on anticoagulation  5. Anemia, likely anemia of chronic disease  6. Mild thrombocytopenia 6. History of breast cancer stage IA, ER+/PR+/HER2-, diagnosed in 2015, s/p lumpectomy, radiation and currently on Letrozole, followed at [place]. I do not think she received chemo   Recommendations: -her anemia work up showed normal reticulocyte count, normal LDH, haptoglobin is pending, tbil was slightly elevated this morning.  I did not see definitive no evidence of hemolysis, will wait for haptoglobin results, please repeat total and direct bilirubin tomorrow morning. -no nutritional anemia based on the lab work -I will get SPEP with IFE and light chain level  -I do not think we need bone marrow biopsy for now. If her anemia persists as moderate after discharge, we can certainly consider BM biopsy to rule out MDS etc  -she did have history of early stage breast cancer diagnosed in 2015.  She denies any significant bone pain, her alkaline phosphatase is normal, I do not have suspicion she has a bone metastasis from her breast cancer which can cause anemia. I do not think work up is needed at this point  -I will also review her peripheral blood smear -  consider blood transfusion to keep Hg>8 due to her heart disease  -I will call her daughter and update her -pt is going to see my partner Dr. Irene Limbo after discharge    All questions were answered. The patient knows to call the clinic with any problems, questions or concerns.      Truitt Merle, MD 04/20/2019 1:18 PM   Addendum  I reviewed her peripheral blood smear, which was unremarkable, no significant schistocytes.  I called her daughter, and answered all her questions.    Truitt Merle  04/20/2019

## 2019-04-20 NOTE — Progress Notes (Signed)
Patient sleeping during shift report, awakened during and returned to sleep.

## 2019-04-20 NOTE — Progress Notes (Signed)
Progress Note  Patient Name: Patricia Wall Date of Encounter: 04/20/2019  Primary Cardiologist: Charlton Haws, MD   Subjective   Pt sleeping , breathing through mouth  When she woke up;  Breathing fair  No CP   Inpatient Medications    Scheduled Meds: . atorvastatin  80 mg Oral Daily  . diltiazem  180 mg Oral Daily  . donepezil  5 mg Oral QHS  . DULoxetine  60 mg Oral Daily  . furosemide  40 mg Intravenous Q12H  . gabapentin  300 mg Oral TID  . ipratropium  0.5 mg Nebulization TID  . letrozole  2.5 mg Oral Daily  . levalbuterol  0.63 mg Nebulization TID  . losartan  25 mg Oral Daily  . methylPREDNISolone (SOLU-MEDROL) injection  40 mg Intravenous Daily  . mometasone-formoterol  2 puff Inhalation BID  . pantoprazole  40 mg Oral Daily  . potassium chloride  40 mEq Oral BID  . sodium chloride flush  3 mL Intravenous Once  . sodium chloride flush  3 mL Intravenous Q12H   Continuous Infusions: . calcium gluconate    . ceFEPime (MAXIPIME) IV 2 g (04/20/19 8676)  . heparin 1,100 Units/hr (04/20/19 0626)  . magnesium sulfate bolus IVPB    . vancomycin 750 mg (04/20/19 0413)   PRN Meds: methocarbamol, nitroGLYCERIN, traMADol   Vital Signs    Vitals:   04/20/19 1135 04/20/19 1136 04/20/19 1137 04/20/19 1138  BP:      Pulse: 94 93 94 93  Resp:      Temp:      TempSrc:      SpO2: 97% 98% 97% 97%  Weight:        Intake/Output Summary (Last 24 hours) at 04/20/2019 1155 Last data filed at 04/20/2019 1135 Gross per 24 hour  Intake 3087.99 ml  Output 2900 ml  Net 187.99 ml   NEt neg 1.6 L   Last 3 Weights 04/20/2019 04/19/2019 04/12/2019  Weight (lbs) 139 lb 5.3 oz 138 lb 7.2 oz 152 lb  Weight (kg) 63.2 kg 62.8 kg 68.947 kg      Telemetry     - Personally Reviewed  ECG    Not today   - Personally Reviewed  Physical Exam   GEN: Thiin 69 yo appearing older than stated age   No acute distress.   Neck: No JVD Cardiac: Irreg irreg, no murmurs, rubs, or  gallops.  Respiratory: Decreased airflow   Rhonchi  . GI: Soft, nontender, non-distended  MS: 1+ edema; No deformity. Neuro:  Nonfocal  Psych: Normal affect   Labs    High Sensitivity Troponin:   Recent Labs  Lab 04/17/19 1620 04/17/19 2059  TROPONINIHS 14 14      Chemistry Recent Labs  Lab 04/18/19 1826 04/19/19 0515 04/20/19 0509  NA 143 143 132*  K 4.2 4.3 3.2*  CL 88* 90* 83*  CO2 42* 44* 37*  GLUCOSE 183* 179* 210*  BUN 24* 28* 23  CREATININE 0.77 0.73 0.63  CALCIUM 8.8* 8.8* 7.4*  PROT 6.0* 6.0* 5.0*  ALBUMIN 2.9* 3.0* 2.4*  AST 17 18 16   ALT 27 27 22   ALKPHOS 81 75 68  BILITOT 0.9 0.8 2.0*  GFRNONAA >60 >60 >60  GFRAA >60 >60 >60  ANIONGAP 13 9 12      Hematology Recent Labs  Lab 04/18/19 0608 04/18/19 0608 04/19/19 0702 04/19/19 1209 04/20/19 0509 04/20/19 0930  WBC 4.5  --  6.8  --  7.2  --  RBC 3.04*   < > 2.97* 2.96* 2.64* 3.06*  HGB 8.4*  --  8.2*  --  7.3*  --   HCT 29.5*  --  27.1*  --  24.4*  --   MCV 97.0  --  91.2  --  92.4  --   MCH 27.6  --  27.6  --  27.7  --   MCHC 28.5*  --  30.3  --  29.9*  --   RDW 19.4*  --  20.0*  --  20.2*  --   PLT 124*  --  158  --  135*  --    < > = values in this interval not displayed.    BNP Recent Labs  Lab 04/15/19 1225 04/17/19 2059 04/19/19 0702  BNP  --  187.1* 212.2*  PROBNP 711*  --   --      DDimer  Recent Labs  Lab 04/19/19 0706  DDIMER 0.56*     Radiology      Cardiac Studies   Echo1/29/21    1. Left ventricular ejection fraction, by visual estimation, is 60 to 65%. The left ventricle has normal function. There is mildly increased left ventricular hypertrophy. 2. Left ventricular diastolic parameters are consistent with Grade I diastolic dysfunction (impaired relaxation). 3. The left ventricle has no regional wall motion abnormalities. 4. Global right ventricle has normal systolic function.The right ventricular size is normal. No increase in right ventricular wall  thickness. 5. Left atrial size was normal. 6. Right atrial size was normal. 7. The mitral valve is normal in structure. No evidence of mitral valve regurgitation. 8. The tricuspid valve is grossly normal. 9. The tricuspid valve is grossly normal. Tricuspid valve regurgitation is trivial. 10. The aortic valve is normal in structure. Aortic valve regurgitation is not visualized. 11. The pulmonic valve was normal in structure. Pulmonic valve regurgitation is not visualized. 12. Moderately elevated pulmonary artery systolic pressure. 13. Pulmonary hypertension is moderate. 14. The atrial septum is grossly normal.  Patient Profile     Patricia Wall is a 69 y.o. female with a hx CAD s/p multiple stenting, PVD, COPD on home O2 4 L, carotid disease, hyperlipidemia, tobacco abuse, and recent admission to [facility] where she was treated for paroxysmal A. Fib, heart failure, and acute anemia who is being seen today for the evaluation of heart failure at the request of Dr. Alfredia Ferguson.  Assessment & Plan    1  Acute on chronic diastolic CHF     Echo as noted above    Parte may be to PAF    Responded to diuresis  Still with evid of volume increase on exam   Continue IV lasix   2 Pulm  Signif COPD   Rx per IM    3  Atrial fibrillation   Intermitt   Now on 180 diltiazem  Not on anticoagution   4   CAD  Pt denies CP   ASA and Plavix on hold     5.  Anemia   Hgb 7.3     Followed by Im     6   HL  COntinue atorvastatin    For questions or updates, please contact [facility] Please consult [email or web address] for contact info under        Signed, Dorris Carnes, MD  04/20/2019, 11:55 AM

## 2019-04-20 NOTE — Progress Notes (Signed)
ANTICOAGULATION CONSULT NOTE   Pharmacy Consult for Heparin Indication: atrial fibrillation  No Known Allergies  Patient Measurements: Weight: 139 lb 5.3 oz (63.2 kg) Heparin Dosing Weight: 60kg  Vital Signs: Temp: 98.7 F (37.1 C) (01/30 0430) Temp Source: Oral (01/30 0430) BP: 148/67 (01/30 0430) Pulse Rate: 96 (01/30 0430)  Labs: Recent Labs    04/17/19 1620 04/17/19 2059 04/17/19 2113 04/18/19 0737 04/18/19 1062 04/18/19 1826 04/19/19 0515 04/19/19 0702 04/19/19 2302 04/20/19 0509  HGB 9.0*  --    < > 8.4*   < >  --   --  8.2*  --  7.3*  HCT 31.3*  --    < > 29.5*  --   --   --  27.1*  --  24.4*  PLT 149*  --    < > 124*  --   --   --  158  --  135*  HEPARINUNFRC  --   --   --   --   --   --   --   --  0.29* 0.22*  CREATININE 0.67  --    < > 0.74  --  0.77 0.73  --   --   --   TROPONINIHS 14 14  --   --   --   --   --   --   --   --    < > = values in this interval not displayed.    Estimated Creatinine Clearance: 61.3 mL/min (by C-G formula based on SCr of 0.73 mg/dL).   Medical History: Past Medical History:  Diagnosis Date  . Abnormal chest xray   . Alcohol abuse   . Back pain   . CAD (coronary artery disease)     Prev seen by [facility].  Stent to OM and LAD Last cath 6/11 cathed on June 13, [name]. 2011.  The cardiac catheterization showed 30% and 50% lesions in the mid  and distal LAD, 50% in the OM1, 40% in the OM2 with 20% in-stent  restenosis, 40% in-stent restenosis in the circumflex and no significant  disease in the RCA.  Her EF was normal.  Dr. Juanda Chance recommended a trial  of proton pump inhibitors   . Chest pain   . COPD (chronic obstructive pulmonary disease) (HCC)   . Cyanosis   . DDD (degenerative disc disease)   . Depression   . Diarrhea   . Diverticular disease   . GERD (gastroesophageal reflux disease)   . H/O: hysterectomy   . Hemorrhoids   . HTN (hypertension)   . IBS (irritable bowel syndrome)   . Insomnia   . Lymphadenitis   .  Pharyngitis   . PUD (peptic ulcer disease)   . PVD (peripheral vascular disease) (HCC)   . Sinusitis   . TIA (transient ischemic attack)   . Tobacco abuse   . Vertigo     Assessment: 69 year old with recent history of afib, and new afib with RVR this admit. Patient not on anticoagulation prior to admit d/t recent anemia. Hgb dropped slightly on admit but is currently stable in 8s, pltc around 150. No bleeding issues noted, currently in ST.   Discussed with cardiology, will start IV heparin (received sq lovenox this am so will not bolus).   1/30 AM update:  Heparin level low despite rate increase No issues per RN  Goal of Therapy:  Heparin level 0.3-0.7 units/ml Monitor platelets by anticoagulation protocol: Yes   Plan:  Inc heparin  to 1100 units/hr Re-check heparin level at [place], PharmD, [place] Pharmacist Phone: [phone]

## 2019-04-20 NOTE — Progress Notes (Signed)
PROGRESS NOTE    Patricia Wall  SAY:[id number] DOB: Apr 10, 1950 DOA: 04/17/2019 PCP: Bernerd Limbo, MD  Brief Narrative:  HPI per Dr. Ileene Musa on 04/17/2019 Patricia Wall is a 69 y.o. female with medical history significant of CAD withmultiple stenting, PVD andprogressiveCOPD on home supplemental O2, 4Lwith ongoing tobacco use who presented with weakness.  Pt unable to provide history due to confusion and no family at bedside. I was unable to reach daughter by phone after multiple attempts.  History obtained entirely from ED physician report and chart review.   Reportedly patient brought in for worsening fatigue over the past 48 hrs and ran out of home O2. She complaints of left sided chest pain that is reproducible on exam but otherwise says "I don't know" to all other questions.   Per cardiology documentation on 1/25, She was admitted to [facility] earlier this month and discharged on 1/15 and was there about 10 days. Found to have CHF andPAF.Placed on metoprolol and Cardizem. Also found to be anemic and was transfused 2 units of pRBC, reportedly heme negative but did not have any further work up. Decision to start anticoagulation was deferred to cardiology and ultimately she was discharged only on Plavix and aspirin.  She had follow up with PCP on 1/21 and Lasix was increased from 14m daily to BID due to increase peripheral edema.   It appears on 1/26 daughter had discuss with cardiology to hold aspirin and Plavix and wanted to discuss GI referral and possible hospice service with PCP.   ED Course: She was afebrile, had borderline BP of 101/47 and was tachypneic and hypercarbic requiring Bipap.  She had no leukocytosis, hemoglobin stable at 9 with chronic anemia, thrombocytopenia 149.  No significant findings on BMP. BNP of 187, troponin flat at 17.  Chest shows new mild hazy left basilar atelectasis and or infiltrate.  She was given 439mLasix, Zosyn and vancomycin in the  ED.   **Interim History Patient was on the BiPAP and was complaining of shortness of breath but now improved.  Started start diuresis and continue steroids and breathing medications have been adjusted.  Remains a little bit hypercarbic.  Will obtain SLP evaluation to ensure that she can safely swallow given her AMS.  Will obtain a TSH, RPR, B12 level and consider head imaging if not improving.  Her AMS appears to be improved and SLP cleared her and she is off of the BiPAP and on 4 L of supplemental oxygen via nasal which is her home dose.  Because she went into A. fib with RVR the day before yesterday continues to be in heart failure have called Cardiology for further evaluation recommendations. Cardiology diuresing and requesting GI to do a scope prior to Cath. Hb/Hct dropped slightly so Heme/Onc Evaluated.   Assessment & Plan:   Active Problems:   CAD S/P percutaneous coronary angioplasty: Prior Cypher DES to pLAD, pOM2 & AVG Cx (after Om2); New DES to AVG Cx ISR & Angiosculpt PTCA of Ostial AVG Cx from OM2.   Acute respiratory failure with hypoxia (HCC)   Anemia   Thrombocytopenia (HCC)   AF (paroxysmal atrial fibrillation) (HCC)   Acute on chronic respiratory failure with hypercapnia (HCC)   SOB (shortness of breath)   Acute diastolic CHF (congestive heart failure) (HCC)   Exertional angina (HCC)   Atrial fibrillation with RVR (HCC)  Acute on Chronic Respiratory Failure with Hypoxia and Hypercarbia requiring noninvasive positive pressure ventilation with BiPAP; now off of BiPAP  and onto 4 L of supplemental nasal cannula -Secondary to HCAP vs COPD exacerbation vs Acute Diastolic CHF exacerbation and likely a combination of all; unlikely HCAP so we will stop antibiotics currently -Baseline at 4L  and she is currently on this right now again  -ABG done and showed a pH of 7.433, PCO2 of 67.1, and PO2 of 131, bicarbonate level of 44.0, and an ABG O2 saturation of 99.4% -No leukocytosis or  fever. Admit as PUI for COVID but this was negative so transferred to medical floor -BNP on admission was 187.1 and repeat yesterday morning was 212.2 -Started on vancomycin and Zosyn in ED- switched vancomycin and switch to Cefepime but will stop Abx Now as repeat CXR today showed "Slight increased prominence of interstitial markings in the lower lungs suggests interstitial edema. Background emphysema. Aortic atherosclerosis." -Obtained PCT was <0.10 -Duonebs q6hr changed to Xopenex/Atrovent for Improvement and because of her history of proximal atrial fibrillation -Will trial steroids and evaluate for any improvement and have started weaning  -Unfortunately do not have records of her latest echo results from recent admission so will repeat but not able to be done given her fast heart rates today -Appears somewhat volume overloaded -Will continue IV Lasix but Cardiology increased 4m BID and monitor Is and Os. Daily weights.  Cardiology recommending continuing diuresis  and planning Cath on Monday  -Repeat chest x-ray in a.m. -When she is awake enough she will need smoking cessation counseling and nicotine patch -Patient remains hypercarbic and last ABG showed a PCO2 of 67 next-continue BiPAP and repeat ABG in 1 hour 2 hours after remaining on BiPAP -Repeat echocardiogram as above and pending to be done  -Admission Chest x-ray did show a left upper lobe "New mild, hazy left basilar atelectasis and/or infiltrate" and repeat yesterday showed "Increasing basilar interstitial opacities, suspect developing edema." -Continue supplemental oxygen and wean off of BiPAP to nasal cannula to her home 4 L and now is on 4 Liters -Continuous pulse oximetry and maintain O2 saturations greater than 90%-we will need ambulatory home O2 screen prior to discharge -Continues to be volume overloaded so cardiology recommends continue IV Lasix and plan is for cath on Monday  Acute Encephalopathy likely in the setting of  hypercarbia rule out other etiologies, improved -Remains somewhat confused but does have donepezil on her MAR and question if she has a history of dementia; per neurology records she does have some memory impairment which has been progressively gotten worse -Check TSH 0.834; RPR was nonreactive and B12 level was 384 -Correct hypercarbia and metabolic issues if still not improving may consider further imaging with EEG and head CT and possible MRI will hold off for now since she did improve some -Obtain SLP evaluation and she is at a mild aspiration risk and they recommend regular diet with thin liquids -Treat infectious causes with pneumonia and continue antibiotics for now -If not improving and will obtain further imaging  Left LE Asymmetry -Left lower extremity appears circumferentially larger than right.   -Will obtain left DVT venous Doppler ultrasound and did not show DVT  CAD s/p multiple stents  -Decision made by family with cardiology on 1/26 to hold aspirin and plavix due to recent acute on chronic anemia -Currently holding her metoprolol and losartan for her borderline blood pressure and n.p.o. status given her confusion -Resume her atorvastatin 80 mg p.o. daily now since she is safe enough to swallow along with her losartan 25 mg daily and metoprolol succinate 25  mg p.o. daily -Denied any chest pain on my examination but did state that she was short of breath -repeat echocardiogram -Cardiology is consulted for further evaluation recommendations and they are planning a cardiac catheterization; patient's aspirin and Plavix is currently on hold due to concern for GI bleeding  Acute COPD exacerbation -As above -Patient takes albuterol inhalers as well as Symbicort and albuterol nebulizers in the outpatient setting as well as to Spiriva -Continue with steroids, nebs, supplemental oxygen, as well as antibiotics as above -Currently is on ipratropium/Xopenex 3 times daily along with  Solu-Medrol 40 mg every 12 and was reduced to 40 mg daily in the a.m. along with 4 L supplemental oxygen via nasal cannula and antibiotic coverage with cefepime and vancomycin; stop vancomycin if MRSA PCR is negative and will stop cefepime as well as do not clinically feel that she has a pneumonia  Acute on Chronic Diastolic CHF -As above -ECHOCardiogram repeated as below  -She takes furosemide 40 mg p.o. twice daily at home -Also takes losartan 25 mg p.o. daily, metoprolol tartrate 25 mg p.o. daily this has been resumed -Cardiology increased her diuresis yesterday to IV 40 mg BID  -Strict I's and O's and daily weights -Continue to monitor for signs and symptoms of volume overload and repeat echocardiogram currently -Repeat chest x-ray showed increasing pulmonary edema -Question of A. fib worsened her heart failure -Patient is -1.327 L since admission and weight is not done -Cardiology consulted for further evaluation recommendations  GERD -Takes omeprazole 40 mg p.o. daily before breakfast and will resume once able to swallow  History of Breast Cancer -She is status post chemotherapy and radiation -Currently on letrozole and this has been resumed  Normocytic Anemia  -Appears chronic in nature -Patient hemoglobin/hematocrit on admission was 10.2/30.0 and then dropped to 8.4/29.5 -> 8.2/27.1 -> 7.3/24.4 -Checked anemia panel and showed an iron level of 55, U IBC of 267, TIBC of 322, saturation ratios of 17%, ferritin level 288, folate level 29.2, vitamin B12 384 -Continue to monitor for signs and symptoms of bleeding -She is being started on anticoagulation with heparin drip for her atrial fibrillation with RVR; currently no overt bleeding noted -Repeat CBC in a.m.  -A decision was made with the patient and the patient's cardiologist to hold her aspirin and Plavix due to recent acute on chronic anemia -Patient is being started on anticoagulation with IV heparin by cardiology and will  need to continue for now given her elevated score risk -Patient expressed to the Cardiology team that she did not want to undergo a GI evaluation currently she is denying any black or bloody stools and anemia panel is done -Gastroenterology consulted for further evaluation and planning on scoping the patient with an endoscopy tomorrow and they recommend continuing PPI and n.p.o. at midnight and holding the heparin drip 4 hours preendoscopy -Do not clinically feel that she has hemolysis given normal bilirubin and I do not feel that this is an issue of bone marrow production currently given normal reticulocyte count -Because her hemoglobin dropped slightly hematology oncology was consulted for further evaluation and have ordered SPEP and IFE's with light chain levels as well as reviewed the peripheral smear which showed no significant schistocytes  -Hematology recommended keeping hemoglobin above 8 due to heart disease and will type and screen and transfuse 1 unit PRBCs given that her hemoglobin/hematocrit was 7.3/24.4 this morning -If hemoglobin/hematocrit remains persistently low in the outpatient setting then oncology will consider a bone marrow biopsy to  rule out MDS -continue monitor and repeat CBC in a.m.  Thrombocytopenia  -Unclear etiology but could be infectious in the setting of her pneumonia.  -Platelet count is now improved and 158,000 yesterday but now is back down to 135,000 -No signs of active bleed. Continue to monitor for signs and symptoms of bleeding -Repeat CBC in the a.m.  Hyponatremia -Mild secondary to diuresis The patient sodium is now 132 -Continue to monitor and trend  Hypokalemia -Patient's potassium this morning was 3.2 -In the setting of diuresis -Replete -Continue to monitor and replete as necessary -Repeat CMP in a.m.  Paroxysmal atrial fibrillation -Not on anticoagulation previously but now has been started.    Went into RVR early yesterday morning morning  with rates in the 150s and and spontaneously converted to normal sinus rhythm and sinus tachycardia -Currently held metoprolol, diltiazem for now until she can safely swallow but now that she is passed she can resume and this is been restarted -Continue with Telemetry monitoring -Cardiology consulted for further evaluation recommendations -Cardiology started the patient on IV heparin drip given her elevated CHA2DS2-VASc of 5 -Cardiology recommending GI evaluation -Further care per cardiology and recommending trending of heparin at 6 AM prior to EGD  Hyperbilirubinemia, improved -T bili was 2.0 this morning but repeat was 0.7 -Continue monitor and trend and repeat CMP in a.m.  DVT prophylaxis: SCDs and enoxaparin 40 mg subcu daily Code Status: FULL CODE  Family Communication: No family present at bedside Disposition Plan: Remain Inpatient for continued workup and improvement of Respiratory Status and Euvolemic State. Will need PT/OT to evaluate and treat and cardiac clearance prior to discharge now they are being consulted for further evaluation  Consultants:   Cardiology  Gastroenterology  Hematology oncology   Procedures:  ECHOCARDIOGRAM  IMPRESSIONS    1. Left ventricular ejection fraction, by visual estimation, is 60 to  65%. The left ventricle has normal function. There is mildly increased  left ventricular hypertrophy.  2. Left ventricular diastolic parameters are consistent with Grade I  diastolic dysfunction (impaired relaxation).  3. The left ventricle has no regional wall motion abnormalities.  4. Global right ventricle has normal systolic function.The right  ventricular size is normal. No increase in right ventricular wall  thickness.  5. Left atrial size was normal.  6. Right atrial size was normal.  7. The mitral valve is normal in structure. No evidence of mitral valve  regurgitation.  8. The tricuspid valve is grossly normal.  9. The tricuspid valve  is grossly normal. Tricuspid valve regurgitation  is trivial.  10. The aortic valve is normal in structure. Aortic valve regurgitation is  not visualized.  11. The pulmonic valve was normal in structure. Pulmonic valve  regurgitation is not visualized.  12. Moderately elevated pulmonary artery systolic pressure.  13. Pulmonary hypertension is moderate.  14. The atrial septum is grossly normal.   FINDINGS  Left Ventricle: Left ventricular ejection fraction, by visual estimation,  is 60 to 65%. The left ventricle has normal function. The left ventricle  has no regional wall motion abnormalities. There is mildly increased left  ventricular hypertrophy. Left  ventricular diastolic parameters are consistent with Grade I diastolic  dysfunction (impaired relaxation).   Right Ventricle: The right ventricular size is normal. No increase in  right ventricular wall thickness. Global RV systolic function is has  normal systolic function. The tricuspid regurgitant velocity is 2.76 m/s,  and with an assumed right atrial pressure  of 15 mmHg, the estimated  right ventricular systolic pressure is  moderately elevated at 45.5 mmHg.   Left Atrium: Left atrial size was normal in size.   Right Atrium: Right atrial size was normal in size   Pericardium: There is no evidence of pericardial effusion.   Mitral Valve: The mitral valve is normal in structure. No evidence of  mitral valve regurgitation.   Tricuspid Valve: The tricuspid valve is grossly normal. Tricuspid valve  regurgitation is trivial.   Aortic Valve: The aortic valve is normal in structure. Aortic valve  regurgitation is not visualized.   Pulmonic Valve: The pulmonic valve was normal in structure. Pulmonic valve  regurgitation is not visualized. Pulmonic regurgitation is not visualized.   Aorta: The aortic root and ascending aorta are structurally normal, with  no evidence of dilitation.   Pulmonary Artery: Pulmonary  hypertension is moderate.   IAS/Shunts: The atrial septum is grossly normal.     LEFT VENTRICLE  PLAX 2D  LVIDd:     4.50 cm    Diastology  LVIDs:     3.10 cm    LV e' lateral:  5.87 cm/s  LV PW:     1.40 cm    LV E/e' lateral: 6.7  LV IVS:    1.20 cm    LV e' medial:  5.44 cm/s  LVOT diam:   1.90 cm    LV E/e' medial: 7.2  LV SV:     55 ml  LV SV Index:  31.84  LVOT Area:   2.84 cm    LV Volumes (MOD)  LV area d, A2C:  18.10 cm  LV area d, A4C:  20.00 cm  LV area s, A2C:  10.50 cm  LV area s, A4C:  10.80 cm  LV major d, A2C:  6.61 cm  LV major d, A4C:  6.16 cm  LV major s, A2C:  5.20 cm  LV major s, A4C:  5.33 cm  LV vol d, MOD A2C: 41.0 ml  LV vol d, MOD A4C: 52.7 ml  LV vol s, MOD A2C: 17.2 ml  LV vol s, MOD A4C: 18.0 ml  LV SV MOD A2C:   23.8 ml  LV SV MOD A4C:   52.7 ml  LV SV MOD BP:   30.4 ml   RIGHT VENTRICLE       IVC  RV S prime:   12.10 cm/s IVC diam: 2.10 cm  TAPSE (M-mode): 1.8 cm   LEFT ATRIUM       Index  LA diam:    2.80 cm 1.64 cm/m  LA Vol (A2C):  28.1 ml 16.43 ml/m  LA Vol (A4C):  20.6 ml 12.04 ml/m  LA Biplane Vol: 23.8 ml 13.91 ml/m  AORTIC VALVE  LVOT Vmax:  90.80 cm/s  LVOT Vmean: 63.900 cm/s  LVOT VTI:  0.153 m    AORTA  Ao Root diam: 3.10 cm   MITRAL VALVE            TRICUSPID VALVE  MV Area (PHT): 5.27 cm       TR Peak grad:  30.5 mmHg  MV PHT:    41.76 msec      TR Vmax:    291.00 cm/s  MV Decel Time: 144 msec  MV E velocity: 39.34 cm/s 103 cm/s SHUNTS  MV A velocity: 76.60 cm/s 70.3 cm/s Systemic VTI: 0.15 m  MV E/A ratio: 0.51    1.5    Systemic Diam: 1.90 cm   Antimicrobials:  Anti-infectives (From admission,  onward)   Start     Dose/Rate Route Frequency Ordered Stop   04/18/19 1000  vancomycin (VANCOREADY) IVPB 750 mg/150 mL     750 mg 150 mL/hr over 60 Minutes Intravenous Every 12  hours 04/18/19 0140     04/18/19 0600  ceFEPIme (MAXIPIME) 2 g in sodium chloride 0.9 % 100 mL IVPB     2 g 200 mL/hr over 30 Minutes Intravenous Every 8 hours 04/18/19 0140     04/17/19 2045  vancomycin (VANCOREADY) IVPB 1250 mg/250 mL     1,250 mg 166.7 mL/hr over 90 Minutes Intravenous  Once 04/17/19 2040 04/18/19 0008   04/17/19 2045  piperacillin-tazobactam (ZOSYN) IVPB 3.375 g     3.375 g 100 mL/hr over 30 Minutes Intravenous  Once 04/17/19 2040 04/17/19 2154     Subjective: Seen and examined bedside and states that she is feeling better.  Was not short of breath.  Denies any nausea or vomiting.  No lightheadedness or dizziness.  Feels well today and still appears little volume overloaded.  No other concerns or complaints at this time and is saturating well and her home 4 L of oxygen  Objective: Vitals:   04/20/19 1300 04/20/19 1330 04/20/19 1452 04/20/19 1701  BP: 126/64 115/61 (!) 128/54   Pulse: (!) 101 93 90   Resp:   16   Temp:      TempSrc:      SpO2: 94% 94% 92%   Weight:      Height:    5' 6"  (1.676 m)    Intake/Output Summary (Last 24 hours) at 04/20/2019 1919 Last data filed at 04/20/2019 1908 Gross per 24 hour  Intake 2785.67 ml  Output 3250 ml  Net -464.33 ml   Filed Weights   04/19/19 0201 04/20/19 0554  Weight: 62.8 kg 63.2 kg   Examination: Physical Exam:  Constitutional: WN/WD currently in no acute distress and appears calm and comfortable Eyes: Lids and conjunctivae normal, sclerae anicteric  ENMT: External Ears, Nose appear normal. Grossly normal hearing. Mucous membranes are moist. Neck: Appears normal, supple, no cervical masses, normal ROM, no appreciable thyromegaly; no JVD Respiratory: Diminished to auscultation bilaterally coarse breath sounds and mild crackles, no wheezing, rales, rhonchi or crackles. Normal respiratory effort and patient is not tachypenic.  Wearing 4 L of supplemental oxygen via nasal cannula Cardiovascular: Tachycardic  rate but regular rhythm, no murmurs / rubs / gallops. S1 and S2 auscultated.  1+ lower extremity edema Abdomen: Soft, non-tender, non-distended. Bowel sounds positive x4.  GU: Deferred. Musculoskeletal: No clubbing / cyanosis of digits/nails. No joint deformity upper and lower extremities. Skin: No rashes, lesions, ulcers on a limited skin evaluation. No induration; Warm and dry.  Neurologic: CN 2-12 grossly intact with no focal deficits. Romberg sign and cerebellar reflexes not assessed.  Psychiatric: Normal judgment and insight. Alert and oriented x 3. Pleasant mood and appropriate affect.   Data Reviewed: I have personally reviewed following labs and imaging studies  CBC: Recent Labs  Lab 04/15/19 1225 04/17/19 1620 04/17/19 2113 04/18/19 0608 04/19/19 0702 04/20/19 0509  WBC 7.4 5.5  --  4.5 6.8 7.2  NEUTROABS  --   --   --   --  5.7 6.3  HGB 8.2* 9.0* 10.2* 8.4* 8.2* 7.3*  HCT 27.5* 31.3* 30.0* 29.5* 27.1* 24.4*  MCV 91 94.8  --  97.0 91.2 92.4  PLT 165 149*  --  124* 158 161*   Basic Metabolic Panel: Recent Labs  Lab  04/17/19 1620 04/17/19 1620 04/17/19 2113 04/18/19 0608 04/18/19 1826 04/19/19 0515 04/20/19 0509  NA 143   < > 139 143 143 143 132*  K 3.7   < > 3.8 3.8 4.2 4.3 3.2*  CL 87*  --   --  88* 88* 90* 83*  CO2 44*  --   --  45* 42* 44* 37*  GLUCOSE 128*  --   --  118* 183* 179* 210*  BUN 16  --   --  18 24* 28* 23  CREATININE 0.67  --   --  0.74 0.77 0.73 0.63  CALCIUM 9.6  --   --  8.9 8.8* 8.8* 7.4*  MG  --   --   --   --   --  1.6* 1.7  PHOS  --   --   --   --   --  4.0 3.2   < > = values in this interval not displayed.   GFR: Estimated Creatinine Clearance: 61.3 mL/min (by C-G formula based on SCr of 0.63 mg/dL). Liver Function Tests: Recent Labs  Lab 04/15/19 1225 04/18/19 1826 04/19/19 0515 04/20/19 0509 04/20/19 1150  AST 18 17 18 16 19   ALT 32 27 27 22 24   ALKPHOS 82 81 75 68 71  BILITOT 0.4 0.9 0.8 2.0* 0.7  PROT 5.5* 6.0* 6.0*  5.0* 5.6*  ALBUMIN 3.4* 2.9* 3.0* 2.4* 2.7*   No results for input(s): LIPASE, AMYLASE in the last 168 hours. No results for input(s): AMMONIA in the last 168 hours. Coagulation Profile: No results for input(s): INR, PROTIME in the last 168 hours. Cardiac Enzymes: No results for input(s): CKTOTAL, CKMB, CKMBINDEX, TROPONINI in the last 168 hours. BNP (last 3 results) Recent Labs    04/15/19 1225  PROBNP 711*   HbA1C: No results for input(s): HGBA1C in the last 72 hours. CBG: Recent Labs  Lab 04/20/19 1708  GLUCAP 279*   Lipid Profile: No results for input(s): CHOL, HDL, LDLCALC, TRIG, CHOLHDL, LDLDIRECT in the last 72 hours. Thyroid Function Tests: Recent Labs    04/19/19 0515  TSH 0.834   Anemia Panel: Recent Labs    04/19/19 0515 04/19/19 1156 04/19/19 1209 04/20/19 0930  VITAMINB12 384  --   --   --   FOLATE  --  29.2  --   --   FERRITIN  --  288  --   --   TIBC  --  322  --   --   IRON  --  55  --   --   RETICCTPCT  --   --  1.9 2.0   Sepsis Labs: Recent Labs  Lab 04/18/19 0005  PROCALCITON <0.10    Recent Results (from the past 240 hour(s))  Blood culture (routine x 2)     Status: None (Preliminary result)   Collection Time: 04/17/19  8:50 PM   Specimen: BLOOD  Result Value Ref Range Status   Specimen Description BLOOD RIGHT ARM  Final   Special Requests   Final    BOTTLES DRAWN AEROBIC AND ANAEROBIC Blood Culture results may not be optimal due to an inadequate volume of blood received in culture bottles   Culture   Final    NO GROWTH 3 DAYS Performed at [facility], [place] [address] [place], [place] [address]    Report Status PENDING  Incomplete  Blood culture (routine x 2)     Status: None (Preliminary result)   Collection Time: 04/17/19  9:07 PM   Specimen: BLOOD  Result Value Ref Range Status   Specimen Description BLOOD RIGHT FOREARM  Final   Special Requests   Final    BOTTLES DRAWN AEROBIC AND ANAEROBIC Blood Culture adequate  volume   Culture   Final    NO GROWTH 3 DAYS Performed at [facility], [address], [place], [place] [address]    Report Status PENDING  Incomplete  Respiratory Panel by RT PCR (Flu A&B, Covid) - Nasopharyngeal Swab     Status: None   Collection Time: 04/17/19  9:40 PM   Specimen: Nasopharyngeal Swab  Result Value Ref Range Status   SARS Coronavirus 2 by RT PCR NEGATIVE NEGATIVE Final    Comment: (NOTE) SARS-CoV-2 target nucleic acids are NOT DETECTED. The SARS-CoV-2 RNA is generally detectable in upper respiratoy specimens during the acute phase of infection. The lowest concentration of SARS-CoV-2 viral copies this assay can detect is 131 copies/mL. A negative result does not preclude SARS-Cov-2 infection and should not be used as the sole basis for treatment or other patient management decisions. A negative result may occur with  improper specimen collection/handling, submission of specimen other than nasopharyngeal swab, presence of viral mutation(s) within the areas targeted by this assay, and inadequate number of viral copies (<131 copies/mL). A negative result must be combined with clinical observations, patient history, and epidemiological information. The expected result is Negative. Fact Sheet for Patients:  PinkCheek.be Fact Sheet for Healthcare Providers:  GravelBags.it This test is not yet ap proved or cleared by the [place] FDA and  has been authorized for detection and/or diagnosis of SARS-CoV-2 by FDA under an Emergency Use Authorization (EUA). This EUA will remain  in effect (meaning this test can be used) for the duration of the COVID-19 declaration under Section 564(b)(1) of the Act, 21 U.S.C. section 360bbb-3(b)(1), unless the authorization is terminated or revoked sooner.    Influenza A by PCR NEGATIVE NEGATIVE Final   Influenza B by PCR NEGATIVE NEGATIVE Final    Comment: (NOTE) The  Xpert Xpress SARS-CoV-2/FLU/RSV assay is intended as an aid in  the diagnosis of influenza from Nasopharyngeal swab specimens and  should not be used as a sole basis for treatment. Nasal washings and  aspirates are unacceptable for Xpert Xpress SARS-CoV-2/FLU/RSV  testing. Fact Sheet for Patients: PinkCheek.be Fact Sheet for Healthcare Providers: GravelBags.it This test is not yet approved or cleared by the [place] FDA and  has been authorized for detection and/or diagnosis of SARS-CoV-2 by  FDA under an Emergency Use Authorization (EUA). This EUA will remain  in effect (meaning this test can be used) for the duration of the  Covid-19 declaration under Section 564(b)(1) of the Act, 21  U.S.C. section 360bbb-3(b)(1), unless the authorization is  terminated or revoked. Performed at [facility], [place] [address] [place], [place] [address]   SARS CORONAVIRUS 2 (TAT 6-24 HRS) Nasopharyngeal Nasopharyngeal Swab     Status: None   Collection Time: 04/18/19 12:35 AM   Specimen: Nasopharyngeal Swab  Result Value Ref Range Status   SARS Coronavirus 2 NEGATIVE NEGATIVE Final    Comment: (NOTE) SARS-CoV-2 target nucleic acids are NOT DETECTED. The SARS-CoV-2 RNA is generally detectable in upper and lower respiratory specimens during the acute phase of infection. Negative results do not preclude SARS-CoV-2 infection, do not rule out co-infections with other pathogens, and should not be used as the sole basis for treatment or other patient management decisions. Negative  results must be combined with clinical observations, patient history, and epidemiological information. The expected result is Negative. Fact Sheet for Patients: SugarRoll.be Fact Sheet for Healthcare Providers: https://www.woods-mathews.com/ This test is not yet approved or cleared by the [place] FDA and  has  been authorized for detection and/or diagnosis of SARS-CoV-2 by FDA under an Emergency Use Authorization (EUA). This EUA will remain  in effect (meaning this test can be used) for the duration of the COVID-19 declaration under Section 56 4(b)(1) of the Act, 21 U.S.C. section 360bbb-3(b)(1), unless the authorization is terminated or revoked sooner. Performed at [facility], [place] [address], [place], [place] [address]   MRSA PCR Screening     Status: None   Collection Time: 04/20/19  8:21 AM   Specimen: Nasal Swab; Nasopharyngeal  Result Value Ref Range Status   MRSA by PCR NEGATIVE NEGATIVE Final    Comment:        The GeneXpert MRSA Assay (FDA approved for NASAL specimens only), is one component of a comprehensive MRSA colonization surveillance program. It is not intended to diagnose MRSA infection nor to guide or monitor treatment for MRSA infections. Performed at [facility], [place] [address], [place], [place] [address]     Radiology Studies: DG CHEST PORT 1 VIEW  Result Date: 04/20/2019 CLINICAL DATA:  Shortness of breath EXAM: PORTABLE CHEST 1 VIEW COMPARISON:  04/19/2019 FINDINGS: Heart size remains normal. Chronic aortic atherosclerosis. Chronic changes of upper lung predominant emphysema with relative upper lung lucency. Slight increase in interstitial prominence in the lower lungs suggest mild interstitial edema. No alveolar edema. No consolidation, collapse or effusion. IMPRESSION: Slight increased prominence of interstitial markings in the lower lungs suggests interstitial edema. Background emphysema. Aortic atherosclerosis. Electronically Signed   By: Nelson Chimes M.D.   On: 04/20/2019 07:46   DG CHEST PORT 1 VIEW  Result Date: 04/19/2019 CLINICAL DATA:  Shortness of breath, COPD, BiPAP EXAM: PORTABLE CHEST 1 VIEW COMPARISON:  Radiograph 04/17/2019 FINDINGS: Chronic hyperinflation. Increasing hazy reticular opacities in the lower lungs. Could reflect developing  edema given septal thickening and some cephalized vascularity. No pneumothorax or visible effusion. Cardiomediastinal contours are stable from prior with a calcified aorta. Telemetry leads overlie the chest. No acute osseous or soft tissue abnormality. Degenerative changes are present in the imaged spine and shoulders. IMPRESSION: Increasing basilar interstitial opacities, suspect developing edema. Electronically Signed   By: Lovena Le M.D.   On: 04/19/2019 06:36   ECHOCARDIOGRAM COMPLETE  Result Date: 04/19/2019   ECHOCARDIOGRAM REPORT   Patient Name:   DASHAYLA THEISSEN Date of Exam: 04/19/2019 Medical Rec #:  [id number]       Height:       66.0 in Accession #:    [id number]      Weight:       138.4 lb Date of Birth:  06/02/1950       BSA:          1.71 m Patient Age:    72 years        BP:           125/61 mmHg Patient Gender: F               HR:           112 bpm. Exam Location:  Inpatient Procedure: 2D Echo, Cardiac Doppler and Color Doppler Indications:    R06.02 SOB  History:        Patient has prior  history of Echocardiogram examinations, most                 recent 01/09/2015. Abnormal ECG, COPD and TIA,                 Signs/Symptoms:Shortness of Breath and Dyspnea; Risk                 Factors:Hypertension and Current Smoker. ETOH. Cyanosis.  Sonographer:    Roseanna Rainbow RDCS Referring Phys: [id number] [facility] LATIF [place]  1. Left ventricular ejection fraction, by visual estimation, is 60 to 65%. The left ventricle has normal function. There is mildly increased left ventricular hypertrophy.  2. Left ventricular diastolic parameters are consistent with Grade I diastolic dysfunction (impaired relaxation).  3. The left ventricle has no regional wall motion abnormalities.  4. Global right ventricle has normal systolic function.The right ventricular size is normal. No increase in right ventricular wall thickness.  5. Left atrial size was normal.  6. Right atrial size was normal.  7. The mitral valve is  normal in structure. No evidence of mitral valve regurgitation.  8. The tricuspid valve is grossly normal.  9. The tricuspid valve is grossly normal. Tricuspid valve regurgitation is trivial. 10. The aortic valve is normal in structure. Aortic valve regurgitation is not visualized. 11. The pulmonic valve was normal in structure. Pulmonic valve regurgitation is not visualized. 12. Moderately elevated pulmonary artery systolic pressure. 13. Pulmonary hypertension is moderate. 14. The atrial septum is grossly normal. FINDINGS  Left Ventricle: Left ventricular ejection fraction, by visual estimation, is 60 to 65%. The left ventricle has normal function. The left ventricle has no regional wall motion abnormalities. There is mildly increased left ventricular hypertrophy. Left ventricular diastolic parameters are consistent with Grade I diastolic dysfunction (impaired relaxation). Right Ventricle: The right ventricular size is normal. No increase in right ventricular wall thickness. Global RV systolic function is has normal systolic function. The tricuspid regurgitant velocity is 2.76 m/s, and with an assumed right atrial pressure  of 15 mmHg, the estimated right ventricular systolic pressure is moderately elevated at 45.5 mmHg. Left Atrium: Left atrial size was normal in size. Right Atrium: Right atrial size was normal in size Pericardium: There is no evidence of pericardial effusion. Mitral Valve: The mitral valve is normal in structure. No evidence of mitral valve regurgitation. Tricuspid Valve: The tricuspid valve is grossly normal. Tricuspid valve regurgitation is trivial. Aortic Valve: The aortic valve is normal in structure. Aortic valve regurgitation is not visualized. Pulmonic Valve: The pulmonic valve was normal in structure. Pulmonic valve regurgitation is not visualized. Pulmonic regurgitation is not visualized. Aorta: The aortic root and ascending aorta are structurally normal, with no evidence of dilitation.  Pulmonary Artery: Pulmonary hypertension is moderate. IAS/Shunts: The atrial septum is grossly normal.  LEFT VENTRICLE PLAX 2D LVIDd:         4.50 cm       Diastology LVIDs:         3.10 cm       LV e' lateral:   5.87 cm/s LV PW:         1.40 cm       LV E/e' lateral: 6.7 LV IVS:        1.20 cm       LV e' medial:    5.44 cm/s LVOT diam:     1.90 cm       LV E/e' medial:  7.2 LV SV:  55 ml LV SV Index:   31.84 LVOT Area:     2.84 cm  LV Volumes (MOD) LV area d, A2C:    18.10 cm LV area d, A4C:    20.00 cm LV area s, A2C:    10.50 cm LV area s, A4C:    10.80 cm LV major d, A2C:   6.61 cm LV major d, A4C:   6.16 cm LV major s, A2C:   5.20 cm LV major s, A4C:   5.33 cm LV vol d, MOD A2C: 41.0 ml LV vol d, MOD A4C: 52.7 ml LV vol s, MOD A2C: 17.2 ml LV vol s, MOD A4C: 18.0 ml LV SV MOD A2C:     23.8 ml LV SV MOD A4C:     52.7 ml LV SV MOD BP:      30.4 ml RIGHT VENTRICLE             IVC RV S prime:     12.10 cm/s  IVC diam: 2.10 cm TAPSE (M-mode): 1.8 cm LEFT ATRIUM             Index LA diam:        2.80 cm 1.64 cm/m LA Vol (A2C):   28.1 ml 16.43 ml/m LA Vol (A4C):   20.6 ml 12.04 ml/m LA Biplane Vol: 23.8 ml 13.91 ml/m  AORTIC VALVE LVOT Vmax:   90.80 cm/s LVOT Vmean:  63.900 cm/s LVOT VTI:    0.153 m  AORTA Ao Root diam: 3.10 cm MITRAL VALVE                        TRICUSPID VALVE MV Area (PHT): 5.27 cm             TR Peak grad:   30.5 mmHg MV PHT:        41.76 msec           TR Vmax:        291.00 cm/s MV Decel Time: 144 msec MV E velocity: 39.34 cm/s 103 cm/s  SHUNTS MV A velocity: 76.60 cm/s 70.3 cm/s Systemic VTI:  0.15 m MV E/A ratio:  0.51       1.5       Systemic Diam: 1.90 cm  Mertie Moores MD Electronically signed by Mertie Moores MD Signature Date/Time: 04/19/2019/5:07:55 PM    Final    Scheduled Meds:  sodium chloride   Intravenous Once   atorvastatin  80 mg Oral Daily   diltiazem  180 mg Oral Daily   donepezil  5 mg Oral QHS   DULoxetine  60 mg Oral Daily   furosemide  40 mg  Intravenous Q12H   gabapentin  300 mg Oral TID   ipratropium  0.5 mg Nebulization TID   letrozole  2.5 mg Oral Daily   levalbuterol  0.63 mg Nebulization TID   losartan  25 mg Oral Daily   methylPREDNISolone (SOLU-MEDROL) injection  40 mg Intravenous Daily   mometasone-formoterol  2 puff Inhalation BID   pantoprazole  40 mg Oral Daily   potassium chloride  40 mEq Oral BID   sodium chloride flush  3 mL Intravenous Once   sodium chloride flush  3 mL Intravenous Q12H   Continuous Infusions:  ceFEPime (MAXIPIME) IV 2 g (04/20/19 1513)   heparin 1,300 Units/hr (04/20/19 1655)   vancomycin 750 mg (04/20/19 1724)    LOS: 3 days   Kerney Elbe, DO [facility] PAGER is  on AMION  If 7PM-7AM, please contact night-coverage [email or web address]

## 2019-04-20 NOTE — Progress Notes (Signed)
ANTICOAGULATION CONSULT NOTE   Pharmacy Consult for Heparin Indication: atrial fibrillation  No Known Allergies  Patient Measurements: Weight: 139 lb 5.3 oz (63.2 kg) Heparin Dosing Weight: 60kg  Vital Signs: Temp: 98.2 F (36.8 C) (01/30 1133) Temp Source: Oral (01/30 1133) BP: 128/54 (01/30 1452) Pulse Rate: 90 (01/30 1452)  Labs: Recent Labs    04/17/19 2059 04/17/19 2113 04/18/19 4098 04/18/19 1191 04/18/19 1826 04/19/19 0515 04/19/19 0702 04/19/19 2302 04/20/19 0509 04/20/19 1428 04/20/19 1540  HGB  --    < > 8.4*   < >  --   --  8.2*  --  7.3*  --   --   HCT  --    < > 29.5*  --   --   --  27.1*  --  24.4*  --   --   PLT  --   --  124*  --   --   --  158  --  135*  --   --   HEPARINUNFRC  --   --   --   --   --   --   --    < > 0.22* 1.46* 0.17*  CREATININE  --   --  0.74   < > 0.77 0.73  --   --  0.63  --   --   TROPONINIHS 14  --   --   --   --   --   --   --   --   --   --    < > = values in this interval not displayed.    Estimated Creatinine Clearance: 61.3 mL/min (by C-G formula based on SCr of 0.63 mg/dL).   Medical History: Past Medical History:  Diagnosis Date  . Abnormal chest xray   . Alcohol abuse   . Back pain   . CAD (coronary artery disease)     Prev seen by [facility].  Stent to OM and LAD Last cath 6/11 cathed on June 13, [name]. 2011.  The cardiac catheterization showed 30% and 50% lesions in the mid  and distal LAD, 50% in the OM1, 40% in the OM2 with 20% in-stent  restenosis, 40% in-stent restenosis in the circumflex and no significant  disease in the RCA.  Her EF was normal.  Dr. Juanda Chance recommended a trial  of proton pump inhibitors   . Chest pain   . COPD (chronic obstructive pulmonary disease) (HCC)   . Cyanosis   . DDD (degenerative disc disease)   . Depression   . Diarrhea   . Diverticular disease   . GERD (gastroesophageal reflux disease)   . H/O: hysterectomy   . Hemorrhoids   . HTN (hypertension)   . IBS (irritable bowel  syndrome)   . Insomnia   . Lymphadenitis   . Pharyngitis   . PUD (peptic ulcer disease)   . PVD (peripheral vascular disease) (HCC)   . Sinusitis   . TIA (transient ischemic attack)   . Tobacco abuse   . Vertigo     Assessment: 69 year old with recent history of afib, and new afib with RVR this admit. Patient not on anticoagulation prior to admit d/t recent anemia. Hgb dropped slightly on admit but is currently stable in 8s, pltc around 150. No bleeding issues noted, currently in ST.  Currently on heparin drip 1100 uts.hr HL low 0.17 (previous 1.4 HL was false elevated - drawn close to heparin infusion)  Plan EGD in am will turn  heparin off at 0600 prior to EGD  Goal of Therapy:  Heparin level 0.3-0.7 units/ml Monitor platelets by anticoagulation protocol: Yes   Plan:  Inc heparin to 1300 units/hr Turn heparin off at 0600 1/31 - 4hr prior to EGD  Follow up after for restarting heparin drip   Bonnita Nasuti Pharm.D. CPP, BCPS Clinical Pharmacist [phone] 04/20/2019 4:50 PM

## 2019-04-20 NOTE — Consult Note (Signed)
[facility] Consultation Note  Referring Provider: [facility] Primary Care Physician:  Bernerd Limbo, MD  Reason for Consultation:  anemia  HPI: Patricia Wall is a 69 y.o. female admitted CHF exacerbation.  Multiple cardiopulmonary comorbidities, including home oxygen COPD and CAD.  She was found to have acute worsening of her chronic anemia.  She has mild chronic periumbilical abdominal pain; denies overt blood in stool, nausea, vomiting, hematemesis.  Reports colonoscopy 10+ years ago, unknown results.  Reports no prior endoscopy.   Past Medical History:  Diagnosis Date  . Abnormal chest xray   . Alcohol abuse   . Back pain   . CAD (coronary artery disease)     Prev seen by [facility].  Stent to OM and LAD Last cath 6/11 cathed on June 13, [name]. 2011.  The cardiac catheterization showed 30% and 50% lesions in the mid  and distal LAD, 50% in the OM1, 40% in the OM2 with 20% in-stent  restenosis, 40% in-stent restenosis in the circumflex and no significant  disease in the RCA.  Her EF was normal.  Dr. Olevia Perches recommended a trial  of proton pump inhibitors   . Chest pain   . COPD (chronic obstructive pulmonary disease) ([place])   . Cyanosis   . DDD (degenerative disc disease)   . Depression   . Diarrhea   . Diverticular disease   . GERD (gastroesophageal reflux disease)   . H/O: hysterectomy   . Hemorrhoids   . HTN (hypertension)   . IBS (irritable bowel syndrome)   . Insomnia   . Lymphadenitis   . Pharyngitis   . PUD (peptic ulcer disease)   . PVD (peripheral vascular disease) ([place])   . Sinusitis   . TIA (transient ischemic attack)   . Tobacco abuse   . Vertigo     Past Surgical History:  Procedure Laterality Date  . ABDOMINAL HYSTERECTOMY    . APPENDECTOMY    . BREAST LUMPECTOMY Left 2015  . CARDIAC CATHETERIZATION  2005, 2006, 2009,2011  . CHOLECYSTECTOMY    . CORONARY STENT PLACEMENT     Status post balloon angiogram plasty and Cypher stent to the distal    circumflex  and OM2 branch  . Hammertoe repair    . Left Bunionectomy    . LEFT HEART CATHETERIZATION WITH CORONARY ANGIOGRAM N/A 12/03/2013   Procedure: LEFT HEART CATHETERIZATION WITH CORONARY ANGIOGRAM;  Surgeon: Leonie Man, MD;  Location: [facility] CATH LAB;  Service: Cardiovascular;  Laterality: N/A;  . TUBAL LIGATION      Prior to Admission medications   Medication Sig Start Date End Date Taking? Authorizing Provider  acidophilus (RISAQUAD) CAPS capsule Take 1 capsule by mouth 2 (two) times daily. 04/09/19  Yes [provider]  albuterol (PROVENTIL) (2.5 MG/3ML) 0.083% nebulizer solution USE 1 VIAL IN NEBULIZER EVERY 4 HOURS AS NEEDED FOR WHEEZING OR SHORTNESS OF BREATH. Patient taking differently: Take 2.5 mg by nebulization every 4 (four) hours as needed for wheezing or shortness of breath. USE 1 VIAL IN NEBULIZER EVERY 4 HOURS AS NEEDED FOR WHEEZING OR SHORTNESS OF BREATH. 07/14/17  Yes Tanda Rockers, MD  albuterol (VENTOLIN HFA) 108 (90 Base) MCG/ACT inhaler Inhale 2 puffs into the lungs every 6 (six) hours as needed for wheezing or shortness of breath. 07/13/17  Yes Tanda Rockers, MD  atorvastatin (LIPITOR) 80 MG tablet Take 80 mg by mouth daily.   Yes [provider]  budesonide-formoterol (SYMBICORT) 160-4.5 MCG/ACT inhaler Inhale 2 puffs into  the lungs 2 (two) times daily. 10/26/16  Yes Parrett, Tammy S, NP  butalbital-acetaminophen-caffeine (FIORICET, ESGIC) 50-325-40 MG tablet Take 1-1.5 tablets by mouth every 8 (eight) hours as needed for headache or migraine. 1 - 1 and 1/2 every 8 hours as needed for headache    Yes [provider]  calcium carbonate (OS-CAL) 600 MG TABS Take 600 mg by mouth every morning.    Yes [provider]  Cholecalciferol (VITAMIN D) 2000 UNITS tablet Take 2,000 Units by mouth every morning.    Yes [provider]  Dextromethorphan-Guaifenesin ([place] FAST-MAX DM MAX) 5-100 MG/5ML LIQD Take 5 mLs by mouth every 4 (four) hours  as needed (cough/ congestiom). 1 tsp every 4 hours as needed for cough/congestion    Yes [provider]  Diclofenac Potassium (CAMBIA) 50 MG PACK Take 50 mg by mouth as directed. Take at onset of headache as needed    Yes [provider]  diltiazem (CARDIZEM CD) 120 MG 24 hr capsule Take 120 mg by mouth daily.  04/05/19  Yes [provider]  donepezil (ARICEPT) 5 MG tablet Take 5 mg by mouth daily.  01/10/18  Yes [provider]  DULoxetine (CYMBALTA) 60 MG capsule Take 60 mg by mouth every morning.    Yes [provider]  Erenumab-aooe (AIMOVIG [place]) Inject 1 Dose into the skin as directed.    Yes [provider]  furosemide (LASIX) 40 MG tablet Take 1 tablet by mouth 2 (two) times daily. 04/07/19  Yes [provider]  gabapentin (NEURONTIN) 300 MG capsule Take 300 mg by mouth 3 (three) times daily.    Yes [provider]  letrozole (FEMARA) 2.5 MG tablet Take 2.5 mg by mouth every morning.    Yes [provider]  losartan (COZAAR) 25 MG tablet Take 25 mg by mouth daily.  04/05/19  Yes [provider]  metFORMIN (GLUCOPHAGE) 1000 MG tablet Take 1,000 mg by mouth 2 (two) times daily with a meal.   Yes [provider]  methocarbamol (ROBAXIN) 750 MG tablet Take 750 mg by mouth every 8 (eight) hours as needed for muscle spasms.   Yes [provider]  metoprolol succinate (TOPROL-XL) 25 MG 24 hr tablet Take 25 mg by mouth daily.  04/05/19  Yes [provider]  Multiple Vitamin (MULTIVITAMIN) tablet Take 1 tablet by mouth daily.    Yes [provider]  nitroGLYCERIN (NITROSTAT) 0.4 MG SL tablet Place 1 tablet (0.4 mg total) under the tongue every 5 (five) minutes as needed for chest pain (may repeat x3). 02/02/18  Yes Josue Hector, MD  omeprazole (PRILOSEC) 40 MG capsule Take 40 mg by mouth daily before breakfast.    Yes [provider]  ondansetron (ZOFRAN) 8 MG tablet  Take 8 mg by mouth every 4 (four) hours as needed for nausea.    Yes [provider]  OXYGEN Use 4L 24/7   Yes [provider]  potassium chloride SA (KLOR-CON M20) 20 MEQ tablet Take 20 mEq by mouth 2 (two) times daily.  04/05/19  Yes [provider]  Respiratory Therapy Supplies (FLUTTER) DEVI Use as directed 07/13/17  Yes Tanda Rockers, MD  Tiotropium Bromide Monohydrate (SPIRIVA RESPIMAT) 2.5 MCG/ACT AERS Inhale 2 puffs into the lungs daily. 10/26/16  Yes Parrett, Tammy S, NP  acetaminophen-codeine (TYLENOL #3) 300-30 MG tablet TAKE 1 TABLET EVERY 4 HOURS AS NEEDED FOR COUGH Patient not taking: Reported on 04/18/2019 03/02/17   Melvyn Novas,  Christena Deem, MD  blood glucose meter kit and supplies Dispense based on patient and insurance preference. Use up to four times daily as directed. (FOR ICD-9 250.00, 250.01). 01/13/15   Reyne Dumas, MD  Lancets (FREESTYLE) lancets Use as instructed 01/13/15   Reyne Dumas, MD    Current Facility-Administered Medications  Medication Dose Route Frequency Provider Last Rate Last Admin  . atorvastatin (LIPITOR) tablet 80 mg  80 mg Oral Daily Raiford Noble Patterson, DO   80 mg at 04/20/19 1039  . calcium gluconate 1 g/ 50 mL sodium chloride IVPB  1 g Intravenous Once Raiford Noble Latif, DO 50 mL/hr at 04/20/19 1328 1,000 mg at 04/20/19 1328  . ceFEPIme (MAXIPIME) 2 g in sodium chloride 0.9 % 100 mL IVPB  2 g Intravenous Q8H Tu, Ching T, DO 200 mL/hr at 04/20/19 0623 2 g at 04/20/19 0623  . diltiazem (CARDIZEM CD) 24 hr capsule 180 mg  180 mg Oral Daily Fransico Him R, MD   180 mg at 04/20/19 1039  . donepezil (ARICEPT) tablet 5 mg  5 mg Oral QHS Sheikh, Georgina Quint [place], DO   5 mg at 04/19/19 2157  . DULoxetine (CYMBALTA) DR capsule 60 mg  60 mg Oral Daily Raiford Noble Latif, DO   60 mg at 04/20/19 1038  . furosemide (LASIX) injection 40 mg  40 mg Intravenous Q12H Sueanne Margarita, MD   40 mg at 04/20/19 1321  . gabapentin (NEURONTIN) capsule 300 mg   300 mg Oral TID Raiford Noble Latif, DO   300 mg at 04/20/19 1038  . heparin ADULT infusion 100 units/mL (25000 units/268m sodium chloride 0.45%)  1,100 Units/hr Intravenous Continuous LErenest Blank RPH 11 mL/hr at 04/20/19 1311 1,100 Units/hr at 04/20/19 1311  . ipratropium (ATROVENT) nebulizer solution 0.5 mg  0.5 mg Nebulization TID SRaiford NobleLatif, DO   0.5 mg at 04/20/19 0730  . letrozole ([facility] tablet 2.5 mg  2.5 mg Oral Daily SRaiford NobleLCoyanosa DO   2.5 mg at 04/20/19 1039  . levalbuterol (XOPENEX) nebulizer solution 0.63 mg  0.63 mg Nebulization TID SRaiford NobleLatif, DO   0.63 mg at 04/20/19 0730  . losartan (COZAAR) tablet 25 mg  25 mg Oral Daily SRaiford NobleL[place] DO   25 mg at 04/20/19 1039  . magnesium sulfate IVPB 2 g 50 mL  2 g Intravenous Once SRaiford NobleLatif, DO 50 mL/hr at 04/20/19 1324 2 g at 04/20/19 1324  . methocarbamol (ROBAXIN) tablet 750 mg  750 mg Oral Q8H PRN Sheikh, OGeorgina QuintLatif, DO      . methylPREDNISolone sodium succinate (SOLU-MEDROL) 40 mg/mL injection 40 mg  40 mg Intravenous Daily SRaiford NobleLatif, DO   40 mg at 04/20/19 1040  . mometasone-formoterol (DULERA) 200-5 MCG/ACT inhaler 2 puff  2 puff Inhalation BID SRaiford NobleLElliott DO   2 puff at 04/20/19 0730  . nitroGLYCERIN (NITROSTAT) SL tablet 0.4 mg  0.4 mg Sublingual Q5 min PRN Sheikh, Omair Latif, DO      . pantoprazole (PROTONIX) EC tablet 40 mg  40 mg Oral Daily SRaiford NobleLDul[place] DO   40 mg at 04/20/19 1038  . potassium chloride SA (KLOR-CON) CR tablet 40 mEq  40 mEq Oral BID SRaiford NobleLHull DO   40 mEq at 04/20/19 1038  . sodium chloride flush (NS) 0.9 % injection 3 mL  3 mL Intravenous Once FDeno Etienne DO      . sodium chloride flush (NS) 0.9 %  injection 3 mL  3 mL Intravenous Q12H Furth, Cadence H, PA-C   3 mL at 04/20/19 1041  . traMADol (ULTRAM) tablet 50 mg  50 mg Oral Q6H PRN Sheikh, Omair Latif, DO      . vancomycin (VANCOREADY) IVPB 750 mg/150 mL  750 mg Intravenous  Q12H Tu, Ching T, DO 150 mL/hr at 04/20/19 0413 750 mg at 04/20/19 0413    Allergies as of 04/17/2019  . (No Known Allergies)    Family History  Problem Relation Age of Onset  . Heart disease Mother   . Rheumatologic disease Mother   . Cancer - Other Mother        Uterine  . Emphysema Father   . Heart disease Father   . Cancer - Other Father   . Coronary artery disease Other   . Asthma Sister        2 sisters  . Stroke Sister     Social History   Socioeconomic History  . Marital status: Legally Separated    Spouse name: Not on file  . Number of children: Not on file  . Years of education: Not on file  . Highest education level: Not on file  Occupational History  . Not on file  Tobacco Use  . Smoking status: Current Every Day Smoker    Packs/day: 2.00    Years: 52.00    Pack years: 104.00    Types: Cigarettes  . Smokeless tobacco: Never Used  . Tobacco comment: down to 0.5ppd   Substance and Sexual Activity  . Alcohol use: No    Alcohol/week: 0.0 standard drinks  . Drug use: Yes    Comment: Seldom Marijuana use- 1Xmonth  . Sexual activity: Not on file  Other Topics Concern  . Not on file  Social History Narrative  . Not on file   Social Determinants of Health   Financial Resource Strain:   . Difficulty of Paying Living Expenses: Not on file  Food Insecurity:   . Worried About Charity fundraiser in the Last Year: Not on file  . Ran Out of Food in the Last Year: Not on file  Transportation Needs:   . Lack of Transportation (Medical): Not on file  . Lack of Transportation (Non-Medical): Not on file  Physical Activity:   . Days of Exercise per Week: Not on file  . Minutes of Exercise per Session: Not on file  Stress:   . Feeling of Stress : Not on file  Social Connections:   . Frequency of Communication with Friends and Family: Not on file  . Frequency of Social Gatherings with Friends and Family: Not on file  . Attends Religious Services: Not on file   . Active Member of Clubs or Organizations: Not on file  . Attends Archivist Meetings: Not on file  . Marital Status: Not on file  Intimate Partner Violence:   . Fear of Current or Ex-Partner: Not on file  . Emotionally Abused: Not on file  . Physically Abused: Not on file  . Sexually Abused: Not on file    Review of Systems: As per HPI, all others negative  Physical Exam: Vital signs in last 24 hours: Temp:  [97.6 F (36.4 C)-98.7 F (37.1 C)] 98.2 F (36.8 C) (01/30 1133) Pulse Rate:  [73-122] 93 (01/30 1138) Resp:  [14-33] 21 (01/30 1133) BP: (100-169)/(54-89) 116/59 (01/30 1133) SpO2:  [92 %-100 %] 97 % (01/30 1138) Weight:  [63.2 kg] 63.2 kg (01/30  0375) Last BM Date: 04/19/19 General:  Chronically ill-appearing, but NAD Head:  Normocephalic and atraumatic. Eyes:  Sclera clear, no icterus.   Conjunctiva pink. Ears:  Normal auditory acuity. Nose:  No deformity, discharge,  or lesions. Mouth:  No deformity or lesions.  Oropharynx pink & moist. Neck:  Supple; no masses or thyromegaly. Lungs:  Poor aeration bibasilar.   No wheezes, crackles, or rhonchi. No acute distress. Heart:  Regular rate and rhythm; no murmurs, clicks, rubs,  or gallops. Abdomen:  Soft, nontender and nondistended. No masses, hepatosplenomegaly or hernias noted. Normal bowel sounds, without guarding, and without rebound.     Msk:  Symmetrical without gross deformities. Normal posture. Pulses:  Normal pulses noted. Extremities:  Without clubbing, 1+ PE BLE Neurologic:  Alert and  oriented x4;  grossly normal neurologically. Skin:  Scattered ecchymoses and telangiectasias, otherwise Intact without significant lesions or rashes. Cervical Nodes:  No significant cervical adenopathy. Psych:  Alert and cooperative. Normal mood and affect.   Lab Results: Recent Labs    04/18/19 0608 04/19/19 0702 04/20/19 0509  WBC 4.5 6.8 7.2  HGB 8.4* 8.2* 7.3*  HCT 29.5* 27.1* 24.4*  PLT 124* 158 135*    BMET Recent Labs    04/18/19 1826 04/19/19 0515 04/20/19 0509  NA 143 143 132*  K 4.2 4.3 3.2*  CL 88* 90* 83*  CO2 42* 44* 37*  GLUCOSE 183* 179* 210*  BUN 24* 28* 23  CREATININE 0.77 0.73 0.63  CALCIUM 8.8* 8.8* 7.4*   LFT Recent Labs    04/20/19 1150  PROT 5.6*  ALBUMIN 2.7*  AST 19  ALT 24  ALKPHOS 71  BILITOT 0.7  BILIDIR 0.1  IBILI 0.6   PT/INR No results for input(s): LABPROT, INR in the last 72 hours.  Impression:  1.  Acute on chronic anemia.  Suspect chronic disease.  No overt Gi bleeding. 2.  Heart failure exacerbation. 3.  CAD. 4.  O2-requiring COPD.  Plan:  1.  Cardiology requests work-up for anemia prior to heart catheterization. 2.  Will proceed with endoscopy tomorrow (which has the highest yield in such situations), so long as felt not too high risk by anesthesia.  If endoscopy is negative, I will discuss with cardiology how much further work-up for anemia would be needed prior to catheterization, in this patient with acute on chronic anemia without evidence of overt GI bleeding. 3.  Risks (bleeding, infection, bowel perforation that could require surgery, sedation-related changes in cardiopulmonary systems), benefits (identification and possible treatment of source of symptoms, exclusion of certain causes of symptoms), and alternatives (watchful waiting, radiographic imaging studies, empiric medical treatment) of upper endoscopy (EGD) were explained to patient/family in detail and patient wishes to proceed. 4.  PPI. 5.  NPO after midnight. 6.  Hold heparin gtt 4 hours pre-endoscopy. 7.  [facility] will follow.   LOS: 3 days   Patricia Wall M  04/20/2019, 1:37 PM  Cell [phone] If no answer or after 5 PM call [phone]

## 2019-04-20 NOTE — Progress Notes (Signed)
This note also relates to the following rows which could not be included: Pulse Rate - Cannot attach notes to unvalidated device data ECG Heart Rate - Cannot attach notes to unvalidated device data SpO2 - Cannot attach notes to unvalidated device data    04/20/19 0615  Provider Notification  Provider Name/Title C. Bodenheimer NP  Date Provider Notified 04/20/19  Time Provider Notified [phone]  Notification Type Page  Notification Reason Other (Comment) (hgb=7.3)  Response Other (Comment) (awaiting call back.)

## 2019-04-20 NOTE — Progress Notes (Signed)
Vancomycin bag half opened by other lost contents to the floor. Replacement requested, but not yet received. Will advise rescheduling of further labs/doses when dose arrives from pharmacy.

## 2019-04-20 NOTE — Progress Notes (Signed)
   04/20/19 0015  MEWS Score  Temp 98.4 F (36.9 C)  BP 131/67  Pulse Rate 97  ECG Heart Rate 99  Resp 18  SpO2 98 %  O2 Device Nasal Cannula  O2 Flow Rate (L/min) 4 L/min  MEWS Score  MEWS Temp 0  MEWS Systolic 0  MEWS Pulse 0  MEWS RR 0  MEWS LOC 0  MEWS Score 0  MEWS Score Color Green  Cardizem 10mg  IV given per order.

## 2019-04-21 ENCOUNTER — Inpatient Hospital Stay (HOSPITAL_COMMUNITY): Payer: Medicare Other | Admitting: Certified Registered Nurse Anesthetist

## 2019-04-21 ENCOUNTER — Encounter (HOSPITAL_COMMUNITY)
Admission: EM | Disposition: A | Discharge status: Home / Self Care | Payer: Self-pay | Source: Home / Self Care | Attending: Internal Medicine

## 2019-04-21 ENCOUNTER — Encounter (HOSPITAL_COMMUNITY): Payer: Self-pay | Admitting: Family Medicine

## 2019-04-21 ENCOUNTER — Inpatient Hospital Stay (HOSPITAL_COMMUNITY): Payer: Medicare Other

## 2019-04-21 HISTORY — PX: ESOPHAGOGASTRODUODENOSCOPY (EGD) WITH PROPOFOL: SHX5813

## 2019-04-21 LAB — CBC WITH DIFFERENTIAL/PLATELET
Abs Immature Granulocytes: 0.12 10*3/uL — ABNORMAL HIGH (ref 0.00–0.07)
Basophils Absolute: 0 10*3/uL (ref 0.0–0.1)
Basophils Relative: 0 %
Eosinophils Absolute: 0 10*3/uL (ref 0.0–0.5)
Eosinophils Relative: 0 %
HCT: 30.2 % — ABNORMAL LOW (ref 36.0–46.0)
Hemoglobin: 9.6 g/dL — ABNORMAL LOW (ref 12.0–15.0)
Immature Granulocytes: 1 %
Lymphocytes Relative: 10 %
Lymphs Abs: 1 10*3/uL (ref 0.7–4.0)
MCH: 27.7 pg (ref 26.0–34.0)
MCHC: 31.8 g/dL (ref 30.0–36.0)
MCV: 87.3 fL (ref 80.0–100.0)
Monocytes Absolute: 0.9 10*3/uL (ref 0.1–1.0)
Monocytes Relative: 10 %
Neutro Abs: 7.2 10*3/uL (ref 1.7–7.7)
Neutrophils Relative %: 79 %
Platelets: 165 10*3/uL (ref 150–400)
RBC: 3.46 MIL/uL — ABNORMAL LOW (ref 3.87–5.11)
RDW: 19.4 % — ABNORMAL HIGH (ref 11.5–15.5)
WBC: 9.2 10*3/uL (ref 4.0–10.5)
nRBC: 0 % (ref 0.0–0.2)

## 2019-04-21 LAB — COMPREHENSIVE METABOLIC PANEL
ALT: 23 U/L (ref 0–44)
AST: 16 U/L (ref 15–41)
Albumin: 2.7 g/dL — ABNORMAL LOW (ref 3.5–5.0)
Alkaline Phosphatase: 85 U/L (ref 38–126)
Anion gap: 11 (ref 5–15)
BUN: 28 mg/dL — ABNORMAL HIGH (ref 8–23)
CO2: 35 mmol/L — ABNORMAL HIGH (ref 22–32)
Calcium: 8.3 mg/dL — ABNORMAL LOW (ref 8.9–10.3)
Chloride: 87 mmol/L — ABNORMAL LOW (ref 98–111)
Creatinine, Ser: 0.88 mg/dL (ref 0.44–1.00)
GFR calc Af Amer: >60 mL/min (ref >60)
GFR calc non Af Amer: >60 mL/min (ref >60)
Glucose, Bld: 322 mg/dL — ABNORMAL HIGH (ref 70–99)
Potassium: 3.7 mmol/L (ref 3.5–5.1)
Sodium: 133 mmol/L — ABNORMAL LOW (ref 135–145)
Total Bilirubin: 0.9 mg/dL (ref 0.3–1.2)
Total Protein: 5.7 g/dL — ABNORMAL LOW (ref 6.5–8.1)

## 2019-04-21 LAB — TYPE AND SCREEN
ABO/RH(D): B POS
Antibody Screen: NEGATIVE
Unit division: 0

## 2019-04-21 LAB — GLUCOSE, CAPILLARY
Glucose-Capillary: 243 mg/dL — ABNORMAL HIGH (ref 70–99)
Glucose-Capillary: 340 mg/dL — ABNORMAL HIGH (ref 70–99)
Glucose-Capillary: 288 mg/dL — ABNORMAL HIGH (ref 70–99)

## 2019-04-21 LAB — HAPTOGLOBIN: Haptoglobin: 149 mg/dL (ref 37–355)

## 2019-04-21 LAB — BPAM RBC
Blood Product Expiration Date: 202102022359
ISSUE DATE / TIME: 202101301955
Unit Type and Rh: 1700

## 2019-04-21 LAB — HEMOGLOBIN A1C
Hgb A1c MFr Bld: 6.2 % — ABNORMAL HIGH (ref 4.8–5.6)
Mean Plasma Glucose: 131.24 mg/dL

## 2019-04-21 LAB — MAGNESIUM: Magnesium: 2.3 mg/dL (ref 1.7–2.4)

## 2019-04-21 LAB — PHOSPHORUS: Phosphorus: 2 mg/dL — ABNORMAL LOW (ref 2.5–4.6)

## 2019-04-21 SURGERY — ESOPHAGOGASTRODUODENOSCOPY (EGD) WITH PROPOFOL
Anesthesia: Monitor Anesthesia Care | Laterality: Left

## 2019-04-21 MED ORDER — LACTATED RINGERS IV SOLN: INTRAVENOUS | Status: DC

## 2019-04-21 MED ORDER — PREDNISONE 20 MG PO TABS
60.0000 mg | ORAL_TABLET | Freq: Every day | ORAL | Status: DC
  Administered 2019-04-23: 09:00:00 60 mg via ORAL
  Filled 2019-04-21: qty 3

## 2019-04-21 MED ORDER — HEPARIN (PORCINE) 25000 UT/250ML-% IV SOLN
1300.0000 [IU]/h | INTRAVENOUS | Status: DC
  Administered 2019-04-21: 1300 [IU]/h via INTRAVENOUS
  Filled 2019-04-21 (×2): qty 250

## 2019-04-21 MED ORDER — INSULIN ASPART 100 UNIT/ML ~~LOC~~ SOLN
0.0000 [IU] | Freq: Three times a day (TID) | SUBCUTANEOUS | Status: DC
  Administered 2019-04-21: 11 [IU] via SUBCUTANEOUS
  Administered 2019-04-21 – 2019-04-23 (×3): 5 [IU] via SUBCUTANEOUS
  Administered 2019-04-23: 09:00:00 2 [IU] via SUBCUTANEOUS

## 2019-04-21 MED ORDER — PROPOFOL 500 MG/50ML IV EMUL
INTRAVENOUS | Status: DC | PRN
  Administered 2019-04-21: 75 ug/kg/min via INTRAVENOUS

## 2019-04-21 MED ORDER — LACTATED RINGERS IV SOLN: INTRAVENOUS | Status: DC | PRN

## 2019-04-21 MED ORDER — K PHOS MONO-SOD PHOS DI & MONO 155-852-130 MG PO TABS
500.0000 mg | ORAL_TABLET | Freq: Two times a day (BID) | ORAL | Status: AC
  Administered 2019-04-21 (×2): 500 mg via ORAL
  Filled 2019-04-21 (×2): qty 2

## 2019-04-21 MED ORDER — PROPOFOL 10 MG/ML IV BOLUS
INTRAVENOUS | Status: DC | PRN
  Administered 2019-04-21: 20 mg via INTRAVENOUS

## 2019-04-21 MED ORDER — NITROGLYCERIN 2 % TD OINT
1.0000 [in_us] | TOPICAL_OINTMENT | Freq: Four times a day (QID) | TRANSDERMAL | Status: DC | PRN
  Filled 2019-04-21: qty 30

## 2019-04-21 MED ORDER — INSULIN ASPART 100 UNIT/ML ~~LOC~~ SOLN
0.0000 [IU] | Freq: Every day | SUBCUTANEOUS | Status: DC
  Administered 2019-04-21: 3 [IU] via SUBCUTANEOUS

## 2019-04-21 SURGICAL SUPPLY — 15 items

## 2019-04-21 NOTE — Op Note (Signed)
[facility] Patient Name: Patricia Wall Procedure Date : 04/21/2019 MRN: [id number] Attending MD: Arta Silence , MD Date of Birth: 07-20-1950 CSN: [id number] Age: 69 Admit Type: Inpatient Procedure:                Upper GI endoscopy Indications:              Iron deficiency anemia Providers:                Arta Silence, MD, Grace Isaac, RN, Laverda Sorenson, Technician, Rejeana Brock, CRNA Referring MD:             [facility] Medicines:                Monitored Anesthesia Care Complications:            No immediate complications. Estimated Blood Loss:     Estimated blood loss: none. Procedure:                Pre-Anesthesia Assessment:                           - Prior to the procedure, a History and Physical                            was performed, and patient medications and                            allergies were reviewed. The patient's tolerance of                            previous anesthesia was also reviewed. The risks                            and benefits of the procedure and the sedation                            options and risks were discussed with the patient.                            All questions were answered, and informed consent                            was obtained. Prior Anticoagulants: The patient has                            taken heparin, last dose was 4 hours pre-procedure.                            ASA Grade Assessment: III - A patient with severe                            systemic disease. After reviewing the risks and  benefits, the patient was deemed in satisfactory                            condition to undergo the procedure.                           After obtaining informed consent, the endoscope was                            passed under direct vision. Throughout the                            procedure, the patient's blood pressure, pulse, and                            oxygen  saturations were monitored continuously. The                            GIF-H190 ([id number]) Olympus gastroscope was                            introduced through the mouth, and advanced to the                            second part of duodenum. The upper GI endoscopy was                            accomplished without difficulty. The patient                            tolerated the procedure well. Scope In: Scope Out: Findings:      The examined esophagus was normal.      The entire examined stomach was normal.      The duodenal bulb, first portion of the duodenum and second portion of       the duodenum were normal. Impression:               - Normal esophagus.                           - Normal stomach.                           - Normal duodenal bulb, first portion of the                            duodenum and second portion of the duodenum.                           - No source of anemia seen on today's endoscopy. No                            old or fresh blood was identified on today's  endoscopy. Moderate Sedation:      None Recommendation:           - Return patient to hospital ward for ongoing care.                           - Cardiac diet today.                           - Continue present medications.                           - I will reach out to cardiology to see what                            further GI testing, if any, is requested prior to                            patient having catheterization.                           - [facility] will follow. Procedure Code(s):        --- Professional ---                           [phone], Esophagogastroduodenoscopy, flexible,                            transoral; diagnostic, including collection of                            specimen(s) by brushing or washing, when performed                            (separate procedure) Diagnosis Code(s):        --- Professional ---                           D50.9,  Iron deficiency anemia, unspecified CPT copyright 2019 [employer]. All rights reserved. The codes documented in this report are preliminary and upon coder review may  be revised to meet current compliance requirements. Willis Modena, MD 04/21/2019 1:49:00 PM This report has been signed electronically. Number of Addenda: 0

## 2019-04-21 NOTE — Progress Notes (Signed)
Progress Note  Patient Name: Patricia Wall Date of Encounter: 04/21/2019  Primary Cardiologist: Jenkins Rouge, MD   Subjective   Pt sleeping , breathing through mouth  When she woke up;  Breathing fair  No CP   Inpatient Medications    Scheduled Meds: . atorvastatin  80 mg Oral Daily  . diltiazem  180 mg Oral Daily  . donepezil  5 mg Oral QHS  . DULoxetine  60 mg Oral Daily  . furosemide  40 mg Intravenous Q12H  . gabapentin  300 mg Oral TID  . ipratropium  0.5 mg Nebulization TID  . letrozole  2.5 mg Oral Daily  . levalbuterol  0.63 mg Nebulization TID  . losartan  25 mg Oral Daily  . methylPREDNISolone (SOLU-MEDROL) injection  40 mg Intravenous Daily  . mometasone-formoterol  2 puff Inhalation BID  . pantoprazole  40 mg Oral Daily  . sodium chloride flush  3 mL Intravenous Once  . sodium chloride flush  3 mL Intravenous Q12H   Continuous Infusions:  PRN Meds: methocarbamol, nitroGLYCERIN, traMADol   Vital Signs    Vitals:   04/21/19 0000 04/21/19 0400 04/21/19 0430 04/21/19 0820  BP: 136/60 (!) 114/58 138/68   Pulse: 98 75 80 92  Resp: 20 20  18   Temp: (!) 97.5 F (36.4 C) 98.2 F (36.8 C)    TempSrc: Oral Oral    SpO2: 92% 95% 97% 100%  Weight:  63.2 kg    Height:        Intake/Output Summary (Last 24 hours) at 04/21/2019 0836 Last data filed at 04/21/2019 0555 Gross per 24 hour  Intake 1815.68 ml  Output 2725 ml  Net -909.32 ml   NEt neg 2.3 L   Last 3 Weights 04/21/2019 04/20/2019 04/19/2019  Weight (lbs) 139 lb 4.9 oz 139 lb 5.3 oz 138 lb 7.2 oz  Weight (kg) 63.19 kg 63.2 kg 62.8 kg      Telemetry    SR /ST   - Personally Reviewed  ECG    Not today   - Personally Reviewed  Physical Exam   GEN:  No acute distress.  More alert than yesterday  Neck: No JVD Cardiac: Irreg irreg, no murmurs, rubs, or gallops.  Respiratory:  Moving air much better than yesteday   . GI: Soft, nontender, non-distended  MS:No signif edema; No  deformity. Neuro:  Nonfocal  Psych: Normal affect   Labs    High Sensitivity Troponin:   Recent Labs  Lab 04/17/19 1620 04/17/19 2059  TROPONINIHS 14 14      Chemistry Recent Labs  Lab 04/19/19 0515 04/19/19 0515 04/20/19 0509 04/20/19 1150 04/21/19 0224  NA 143  --  132*  --  133*  K 4.3  --  3.2*  --  3.7  CL 90*  --  83*  --  87*  CO2 44*  --  37*  --  35*  GLUCOSE 179*  --  210*  --  322*  BUN 28*  --  23  --  28*  CREATININE 0.73  --  0.63  --  0.88  CALCIUM 8.8*  --  7.4*  --  8.3*  PROT 6.0*   < > 5.0* 5.6* 5.7*  ALBUMIN 3.0*   < > 2.4* 2.7* 2.7*  AST 18   < > 16 19 16   ALT 27   < > 22 24 23   ALKPHOS 75   < > 68 71 85  BILITOT 0.8   < >  2.0* 0.7 0.9  GFRNONAA >60  --  >60  --  >60  GFRAA >60  --  >60  --  >60  ANIONGAP 9  --  12  --  11   < > = values in this interval not displayed.     Hematology Recent Labs  Lab 04/19/19 0702 04/19/19 1209 04/20/19 0509 04/20/19 0930 04/21/19 0224  WBC 6.8  --  7.2  --  9.2  RBC 2.97*   < > 2.64* 3.06* 3.46*  HGB 8.2*  --  7.3*  --  9.6*  HCT 27.1*  --  24.4*  --  30.2*  MCV 91.2  --  92.4  --  87.3  MCH 27.6  --  27.7  --  27.7  MCHC 30.3  --  29.9*  --  31.8  RDW 20.0*  --  20.2*  --  19.4*  PLT 158  --  135*  --  165   < > = values in this interval not displayed.    BNP Recent Labs  Lab 04/15/19 1225 04/17/19 2059 04/19/19 0702  BNP  --  187.1* 212.2*  PROBNP 711*  --   --      DDimer  Recent Labs  Lab 04/19/19 0706  DDIMER 0.56*     Radiology      Cardiac Studies   Echo1/29/21    1. Left ventricular ejection fraction, by visual estimation, is 60 to 65%. The left ventricle has normal function. There is mildly increased left ventricular hypertrophy. 2. Left ventricular diastolic parameters are consistent with Grade I diastolic dysfunction (impaired relaxation). 3. The left ventricle has no regional wall motion abnormalities. 4. Global right ventricle has normal systolic  function.The right ventricular size is normal. No increase in right ventricular wall thickness. 5. Left atrial size was normal. 6. Right atrial size was normal. 7. The mitral valve is normal in structure. No evidence of mitral valve regurgitation. 8. The tricuspid valve is grossly normal. 9. The tricuspid valve is grossly normal. Tricuspid valve regurgitation is trivial. 10. The aortic valve is normal in structure. Aortic valve regurgitation is not visualized. 11. The pulmonic valve was normal in structure. Pulmonic valve regurgitation is not visualized. 12. Moderately elevated pulmonary artery systolic pressure. 13. Pulmonary hypertension is moderate. 14. The atrial septum is grossly normal.  Patient Profile     Patricia Wall is a 69 y.o. female with a hx CAD s/p multiple stenting, PVD, COPD on home O2 4 L, carotid disease, hyperlipidemia, tobacco abuse, and recent admission to [facility] where she was treated for paroxysmal A. Fib, heart failure, and acute anemia who is being seen today for the evaluation of heart failure at the request of Dr. Marland Mcalpine.  Assessment & Plan    1  Acute on chronic diastolic CHF    May be related to PAF   She had been hosp on Apr 10, 2019 at [facility] for this      She takes lasix at home 40 bid  Seen in ED on 1/27   Missed lasix  Resp distress.  GIven IV abx and diuretics   On 1/29 in afib with RVR  Rates up to 160s   Yesterday she diuresed and today her exam is improved   She is comfortable, alert Recom:   Keep on IV today since she is NPO   Switch tomorrow   To po Need to review her pulmonary Hx    Afib may be why she  is having exacerbations.   Not sure if trying to keep her in rhythm with amio potentially a good choice.    2 Pulm  Signif COPD   Rx per medicine  Exam is much improved from yesterday    3  Atrial fibrillation   Intermitt   Now on 180 diltiazem  Not on anticoagulation   She remains in SR today    4   CAD  Hx CAD  Stent to LAD, LX  OM2 in 2005; PTCA instent stenosis in 2006;  Stent to LCx in 2015  ON Admit she did complain of CP that was like pain prior to stent   Trop 14 and 14 (normal)  COncern on admit that she had  Progression of CAD   Tentative plan for cath on Monday (last cath 5 years prior) but GI eval planned first      ASA and Plavix on hold due to anemia    5.  Anemia   Hgb 9.6 today     Followed by Im    Plan for endoscopy today     6   HL  COntinue atorvastatin    For questions or updates, please contact [facility] Please consult [email or web address] for contact info under        Signed, Isaish Alemu, MD  04/21/2019, 8:36 AM    

## 2019-04-21 NOTE — Progress Notes (Signed)
ANTICOAGULATION CONSULT NOTE   Pharmacy Consult for Heparin Indication: atrial fibrillation  No Known Allergies  Patient Measurements: Height: 5\' 6"  (167.6 cm) Weight: 139 lb 4.9 oz (63.2 kg) IBW/kg (Calculated) : 59.3 Heparin Dosing Weight: 60kg  Vital Signs: Temp: 98 F (36.7 C) (01/31 1427) Temp Source: Oral (01/31 1427) BP: 156/68 (01/31 1355) Pulse Rate: 90 (01/31 1355)  Labs: Recent Labs     0000 04/19/19 0515 04/19/19 0702 04/19/19 2302 04/20/19 0509 04/20/19 1428 04/20/19 1540 04/21/19 0224  HGB   < >  --  8.2*  --  7.3*  --   --  9.6*  HCT  --   --  27.1*  --  24.4*  --   --  30.2*  PLT  --   --  158  --  135*  --   --  165  HEPARINUNFRC  --   --   --    < > 0.22* 1.46* 0.17*  --   CREATININE  --  0.73  --   --  0.63  --   --  0.88   < > = values in this interval not displayed.    Estimated Creatinine Clearance: 55.7 mL/min (by C-G formula based on SCr of 0.88 mg/dL).   Medical History: Past Medical History:  Diagnosis Date  . Abnormal chest xray   . Alcohol abuse   . Back pain   . CAD (coronary artery disease)     Prev seen by [facility].  Stent to OM and LAD Last cath 6/11 cathed on June 13, [name]. 2011.  The cardiac catheterization showed 30% and 50% lesions in the mid  and distal LAD, 50% in the OM1, 40% in the OM2 with 20% in-stent  restenosis, 40% in-stent restenosis in the circumflex and no significant  disease in the RCA.  Her EF was normal.  Dr. 2012 recommended a trial  of proton pump inhibitors   . Chest pain   . COPD (chronic obstructive pulmonary disease) (HCC)   . Cyanosis   . DDD (degenerative disc disease)   . Depression   . Diarrhea   . Diverticular disease   . GERD (gastroesophageal reflux disease)   . H/O: hysterectomy   . Hemorrhoids   . HTN (hypertension)   . IBS (irritable bowel syndrome)   . Insomnia   . Lymphadenitis   . Pharyngitis   . PUD (peptic ulcer disease)   . PVD (peripheral vascular disease) (HCC)   . Sinusitis    . TIA (transient ischemic attack)   . Tobacco abuse   . Vertigo     Assessment: 69 year old with recent history of afib, and new afib with RVR this admit. Patient not on anticoagulation prior to admit d/t recent anemia. Pharmacy asked to dose IV Heparin.  Heparin was turned off around 0700 for EGD this morning. EGD showed normal findings.  Hgb dropped slightly on admit but is currently stable at 9.6, platelets wnl. No overt bleeding noted. Starting back IV heparin now at previous drip rate of 1300 units/hr.  Goal of Therapy:  Heparin level 0.3-0.7 units/ml Monitor platelets by anticoagulation protocol: Yes   Plan:  Restart heparin IV at 1300 units/hr Check 8 hr heparin level Daily heparin level and CBC Monitor for signs and symptoms of bleeding  64, PharmD PGY1 Pharmacy Resident Phone: [phone] 04/21/2019  2:52 PM  Please check [email or web address] for unit-specific pharmacy phone numbers.

## 2019-04-21 NOTE — H&P (View-Only) (Signed)
Progress Note  Patient Name: Patricia Wall Date of Encounter: 04/21/2019  Primary Cardiologist: Jenkins Rouge, MD   Subjective   Pt sleeping , breathing through mouth  When she woke up;  Breathing fair  No CP   Inpatient Medications    Scheduled Meds: . atorvastatin  80 mg Oral Daily  . diltiazem  180 mg Oral Daily  . donepezil  5 mg Oral QHS  . DULoxetine  60 mg Oral Daily  . furosemide  40 mg Intravenous Q12H  . gabapentin  300 mg Oral TID  . ipratropium  0.5 mg Nebulization TID  . letrozole  2.5 mg Oral Daily  . levalbuterol  0.63 mg Nebulization TID  . losartan  25 mg Oral Daily  . methylPREDNISolone (SOLU-MEDROL) injection  40 mg Intravenous Daily  . mometasone-formoterol  2 puff Inhalation BID  . pantoprazole  40 mg Oral Daily  . sodium chloride flush  3 mL Intravenous Once  . sodium chloride flush  3 mL Intravenous Q12H   Continuous Infusions:  PRN Meds: methocarbamol, nitroGLYCERIN, traMADol   Vital Signs    Vitals:   04/21/19 0000 04/21/19 0400 04/21/19 0430 04/21/19 0820  BP: 136/60 (!) 114/58 138/68   Pulse: 98 75 80 92  Resp: 20 20  18   Temp: (!) 97.5 F (36.4 C) 98.2 F (36.8 C)    TempSrc: Oral Oral    SpO2: 92% 95% 97% 100%  Weight:  63.2 kg    Height:        Intake/Output Summary (Last 24 hours) at 04/21/2019 0836 Last data filed at 04/21/2019 0555 Gross per 24 hour  Intake 1815.68 ml  Output 2725 ml  Net -909.32 ml   NEt neg 2.3 L   Last 3 Weights 04/21/2019 04/20/2019 04/19/2019  Weight (lbs) 139 lb 4.9 oz 139 lb 5.3 oz 138 lb 7.2 oz  Weight (kg) 63.19 kg 63.2 kg 62.8 kg      Telemetry    SR /ST   - Personally Reviewed  ECG    Not today   - Personally Reviewed  Physical Exam   GEN:  No acute distress.  More alert than yesterday  Neck: No JVD Cardiac: Irreg irreg, no murmurs, rubs, or gallops.  Respiratory:  Moving air much better than yesteday   . GI: Soft, nontender, non-distended  MS:No signif edema; No  deformity. Neuro:  Nonfocal  Psych: Normal affect   Labs    High Sensitivity Troponin:   Recent Labs  Lab 04/17/19 1620 04/17/19 2059  TROPONINIHS 14 14      Chemistry Recent Labs  Lab 04/19/19 0515 04/19/19 0515 04/20/19 0509 04/20/19 1150 04/21/19 0224  NA 143  --  132*  --  133*  K 4.3  --  3.2*  --  3.7  CL 90*  --  83*  --  87*  CO2 44*  --  37*  --  35*  GLUCOSE 179*  --  210*  --  322*  BUN 28*  --  23  --  28*  CREATININE 0.73  --  0.63  --  0.88  CALCIUM 8.8*  --  7.4*  --  8.3*  PROT 6.0*   < > 5.0* 5.6* 5.7*  ALBUMIN 3.0*   < > 2.4* 2.7* 2.7*  AST 18   < > 16 19 16   ALT 27   < > 22 24 23   ALKPHOS 75   < > 68 71 85  BILITOT 0.8   < >  2.0* 0.7 0.9  GFRNONAA >60  --  >60  --  >60  GFRAA >60  --  >60  --  >60  ANIONGAP 9  --  12  --  11   < > = values in this interval not displayed.     Hematology Recent Labs  Lab 04/19/19 0702 04/19/19 1209 04/20/19 0509 04/20/19 0930 04/21/19 0224  WBC 6.8  --  7.2  --  9.2  RBC 2.97*   < > 2.64* 3.06* 3.46*  HGB 8.2*  --  7.3*  --  9.6*  HCT 27.1*  --  24.4*  --  30.2*  MCV 91.2  --  92.4  --  87.3  MCH 27.6  --  27.7  --  27.7  MCHC 30.3  --  29.9*  --  31.8  RDW 20.0*  --  20.2*  --  19.4*  PLT 158  --  135*  --  165   < > = values in this interval not displayed.    BNP Recent Labs  Lab 04/15/19 1225 04/17/19 2059 04/19/19 0702  BNP  --  187.1* 212.2*  PROBNP 711*  --   --      DDimer  Recent Labs  Lab 04/19/19 0706  DDIMER 0.56*     Radiology      Cardiac Studies   Echo1/29/21    1. Left ventricular ejection fraction, by visual estimation, is 60 to 65%. The left ventricle has normal function. There is mildly increased left ventricular hypertrophy. 2. Left ventricular diastolic parameters are consistent with Grade I diastolic dysfunction (impaired relaxation). 3. The left ventricle has no regional wall motion abnormalities. 4. Global right ventricle has normal systolic  function.The right ventricular size is normal. No increase in right ventricular wall thickness. 5. Left atrial size was normal. 6. Right atrial size was normal. 7. The mitral valve is normal in structure. No evidence of mitral valve regurgitation. 8. The tricuspid valve is grossly normal. 9. The tricuspid valve is grossly normal. Tricuspid valve regurgitation is trivial. 10. The aortic valve is normal in structure. Aortic valve regurgitation is not visualized. 11. The pulmonic valve was normal in structure. Pulmonic valve regurgitation is not visualized. 12. Moderately elevated pulmonary artery systolic pressure. 13. Pulmonary hypertension is moderate. 14. The atrial septum is grossly normal.  Patient Profile     Patricia Wall is a 69 y.o. female with a hx CAD s/p multiple stenting, PVD, COPD on home O2 4 L, carotid disease, hyperlipidemia, tobacco abuse, and recent admission to [facility] where she was treated for paroxysmal A. Fib, heart failure, and acute anemia who is being seen today for the evaluation of heart failure at the request of Dr. Marland Mcalpine.  Assessment & Plan    1  Acute on chronic diastolic CHF    May be related to PAF   She had been hosp on Apr 10, 2019 at [facility] for this      She takes lasix at home 40 bid  Seen in ED on 1/27   Missed lasix  Resp distress.  GIven IV abx and diuretics   On 1/29 in afib with RVR  Rates up to 160s   Yesterday she diuresed and today her exam is improved   She is comfortable, alert Recom:   Keep on IV today since she is NPO   Switch tomorrow   To po Need to review her pulmonary Hx    Afib may be why she  is having exacerbations.   Not sure if trying to keep her in rhythm with amio potentially a good choice.    2 Pulm  Signif COPD   Rx per medicine  Exam is much improved from yesterday    3  Atrial fibrillation   Intermitt   Now on 180 diltiazem  Not on anticoagulation   She remains in SR today    4   CAD  Hx CAD  Stent to LAD, LX  OM2 in 2005; PTCA instent stenosis in 2006;  Stent to LCx in 2015  ON Admit she did complain of CP that was like pain prior to stent   Trop 14 and 14 (normal)  COncern on admit that she had  Progression of CAD   Tentative plan for cath on Monday (last cath 5 years prior) but GI eval planned first      ASA and Plavix on hold due to anemia    5.  Anemia   Hgb 9.6 today     Followed by Im    Plan for endoscopy today     6   HL  COntinue atorvastatin    For questions or updates, please contact [facility] Please consult [email or web address] for contact info under        Signed, Dietrich Pates, MD  04/21/2019, 8:36 AM

## 2019-04-21 NOTE — Anesthesia Preprocedure Evaluation (Addendum)
Anesthesia Evaluation  Patient identified by MRN, date of birth, ID band Patient awake    Reviewed: Allergy & Precautions, NPO status , Patient's Chart, lab work & pertinent test results, reviewed documented beta blocker date and time   History of Anesthesia Complications Negative for: history of anesthetic complications  Airway Mallampati: I  TM Distance: >3 FB Neck ROM: Full    Dental  (+) Edentulous Upper, Edentulous Lower   Pulmonary COPD,  COPD inhaler and oxygen dependent, Current Smoker and Patient abstained from smoking.,  04/18/2019 SARS coronavirus NEG   breath sounds clear to auscultation       Cardiovascular hypertension, Pt. on medications and Pt. on home beta blockers (-) angina+ CAD, + Cardiac Stents and + Peripheral Vascular Disease   Rhythm:Regular Rate:Normal  04/19/2019 ECHO: EF 60-65%, valves OK   Neuro/Psych TIA   GI/Hepatic PUD, GERD  Medicated and Controlled,(+)     substance abuse  alcohol use,   Endo/Other  diabetes (glu 322), Oral Hypoglycemic Agents  Renal/GU negative Renal ROS     Musculoskeletal   Abdominal   Peds  Hematology  (+) Blood dyscrasia (Hb 9.6 s/p transfusion), anemia ,   Anesthesia Other Findings   Reproductive/Obstetrics                            Anesthesia Physical Anesthesia Plan  ASA: III  Anesthesia Plan: MAC   Post-op Pain Management:    Induction:   PONV Risk Score and Plan: 1 and Treatment may vary due to age or medical condition  Airway Management Planned: Natural Airway and Nasal Cannula  Additional Equipment:   Intra-op Plan:   Post-operative Plan:   Informed Consent: I have reviewed the patients History and Physical, chart, labs and discussed the procedure including the risks, benefits and alternatives for the proposed anesthesia with the patient or authorized representative who has indicated his/her understanding and  acceptance.       Plan Discussed with: CRNA and Surgeon  Anesthesia Plan Comments:        Anesthesia Quick Evaluation

## 2019-04-21 NOTE — Transfer of Care (Signed)
Immediate Anesthesia Transfer of Care Note  Patient: Patricia Wall  Procedure(s) Performed: ESOPHAGOGASTRODUODENOSCOPY (EGD) WITH PROPOFOL (Left )  Patient Location: PACU and Endoscopy Unit  Anesthesia Type:MAC  Level of Consciousness: awake, alert  and oriented  Airway & Oxygen Therapy: Patient Spontanous Breathing and Patient connected to nasal cannula oxygen  Post-op Assessment: Report given to RN and Post -op Vital signs reviewed and stable  Post vital signs: Reviewed and stable  Last Vitals:  Vitals Value Taken Time  BP    Temp    Pulse    Resp    SpO2      Last Pain:  Vitals:   04/21/19 1203  TempSrc: Oral  PainSc: 0-No pain         Complications: No apparent anesthesia complications

## 2019-04-21 NOTE — Interval H&P Note (Signed)
History and Physical Interval Note:  04/21/2019 12:51 PM  Patricia Wall  has presented today for surgery, with the diagnosis of anemia, need for anticoagulation.  The various methods of treatment have been discussed with the patient and family. After consideration of risks, benefits and other options for treatment, the patient has consented to  Procedure(s): ESOPHAGOGASTRODUODENOSCOPY (EGD) WITH PROPOFOL (Left) as a surgical intervention.  The patient's history has been reviewed, patient examined, no change in status, stable for surgery.  I have reviewed the patient's chart and labs.  Questions were answered to the patient's satisfaction.     Freddy Jaksch

## 2019-04-21 NOTE — Anesthesia Postprocedure Evaluation (Addendum)
Anesthesia Post Note  Patient: Patricia Wall  Procedure(s) Performed: ESOPHAGOGASTRODUODENOSCOPY (EGD) WITH PROPOFOL (Left )     Patient location during evaluation: Endoscopy Anesthesia Type: MAC Level of consciousness: awake and alert, patient cooperative and oriented Pain management: pain level controlled Vital Signs Assessment: post-procedure vital signs reviewed and stable Respiratory status: spontaneous breathing, nonlabored ventilation, respiratory function stable and patient connected to nasal cannula oxygen Cardiovascular status: blood pressure returned to baseline and stable Postop Assessment: no apparent nausea or vomiting Anesthetic complications: no    Last Vitals:  Vitals:   04/21/19 1355 04/21/19 1427  BP: (!) 156/68   Pulse: 90   Resp: 15   Temp:  36.7 C  SpO2: 96%     Last Pain:  Vitals:   04/21/19 1427  TempSrc: Oral  PainSc: 0-No pain                 Mays Paino,E. Lestat Golob

## 2019-04-21 NOTE — Progress Notes (Signed)
PROGRESS NOTE    Patricia Wall  LSL:[id number] DOB: [birth year] DOA: 04/17/2019 PCP: Bernerd Limbo, MD  Brief Narrative:  HPI per Dr. Ileene Musa on 04/17/2019 [name] is a 69 y.o. female with medical history significant of CAD withmultiple stenting, PVD andprogressiveCOPD on home supplemental O2, 4Lwith ongoing tobacco use who presented with weakness.  Pt unable to provide history due to confusion and no family at bedside. I was unable to reach daughter by phone after multiple attempts.  History obtained entirely from ED physician report and chart review.   Reportedly patient brought in for worsening fatigue over the past 48 hrs and ran out of home O2. She complaints of left sided chest pain that is reproducible on exam but otherwise says "I don't know" to all other questions.   Per cardiology documentation on 1/25, She was admitted to [facility] earlier this month and discharged on 1/15 and was there about 10 days. Found to have CHF andPAF.Placed on metoprolol and Cardizem. Also found to be anemic and was transfused 2 units of pRBC, reportedly heme negative but did not have any further work up. Decision to start anticoagulation was deferred to cardiology and ultimately she was discharged only on Plavix and aspirin.  She had follow up with PCP on 1/21 and Lasix was increased from 54m daily to BID due to increase peripheral edema.   It appears on 1/26 daughter had discuss with cardiology to hold aspirin and Plavix and wanted to discuss GI referral and possible hospice service with PCP.   ED Course: She was afebrile, had borderline BP of 101/47 and was tachypneic and hypercarbic requiring Bipap.  She had no leukocytosis, hemoglobin stable at 9 with chronic anemia, thrombocytopenia 149.  No significant findings on BMP. BNP of 187, troponin flat at 17.  Chest shows new mild hazy left basilar atelectasis and or infiltrate.  She was given 472mLasix, Zosyn and vancomycin in the  ED.   **Interim History Patient was on the BiPAP initially and was complaining of shortness of breath but now improved.  Started start diuresis and continue steroids and breathing medications have been adjusted.  Remained a little bit hypercarbic.  Will obtain SLP evaluation to ensure that she can safely swallow given her AMS and obtained a TSH, RPR, B12 level.   Her AMS appears to be improved and SLP cleared her and she is off of the BiPAP and on 4 L of supplemental oxygen via nasal which is her home dose.  Because she went into A. fib with RVR  A few days ago and continues to be in heart failure have called Cardiology for further evaluation recommendations. Cardiology diuresing and requesting GI to do a scope prior to Cath. Hb/Hct dropped slightly so she was transfused 1 unit of pRBC's and Heme/Onc Evaluated.   She underwent an EGD today showed a normal esophagus, normal stomach, normal duodenal bulb and the first portion of the duodenum and second portion of the duodenum and no source of anemia was found on endoscopy and no old or fresh blood was identified.  Patient complaining of some chest pain which she is already on a heparin drip and is going for cardiac catheterization tomorrow.  Assessment & Plan:   Active Problems:   CAD S/P percutaneous coronary angioplasty: Prior Cypher DES to pLAD, pOM2 & AVG Cx (after Om2); New DES to AVG Cx ISR & Angiosculpt PTCA of Ostial AVG Cx from OM2.   Acute respiratory failure with hypoxia ([facility]  Anemia   Thrombocytopenia (HCC)   AF (paroxysmal atrial fibrillation) (HCC)   Acute on chronic respiratory failure with hypercapnia (HCC)   SOB (shortness of breath)   Acute diastolic CHF (congestive heart failure) (HCC)   Exertional angina (HCC)   Atrial fibrillation with RVR (HCC)  Acute on Chronic Respiratory Failure with Hypoxia and Hypercarbia requiring noninvasive positive pressure ventilation with BiPAP; now off of BiPAP and onto 4 L of supplemental  nasal cannula -Secondary to HCAP vs COPD exacerbation vs Acute Diastolic CHF exacerbation and likely a combination of all; unlikely HCAP so we will stop antibiotics currently -Baseline at 4L  and she is currently on this right now again  -ABG done and showed a pH of 7.433, PCO2 of 67.1, and PO2 of 131, bicarbonate level of 44.0, and an ABG O2 saturation of 99.4% -No leukocytosis or fever. Admit as PUI for COVID but this was negative so transferred to medical floor -BNP on admission was 187.1 and repeat was 212.2 -Started on vancomycin and Zosyn in ED- switched vancomycin and switch to Cefepime but will stop Abx Now as repeat CXR 04/20/2019 showed "Slight increased prominence of interstitial markings in the lower lungs suggests interstitial edema. Background emphysema. Aortic atherosclerosis." -Obtained PCT was <0.10 -Duonebs q6hr changed to Xopenex/Atrovent for Improvement and because of her history of proximal atrial fibrillation -Will trial steroids and evaluate for any improvement and have started weaning as currently on IV 40 mg daily and will change to p.o. prednisone taper in the a.m. -Unfortunately do not have records of her latest echo results from recent admission so will repeat but not able to be done given her fast heart rates today -Appears somewhat volume overloaded -Will continue IV Lasix but Cardiology increased 94m BID they are recommending continuing today and change to p.o. tomorrow -Continue to monitor Is and Os. Daily weights.  Cardiology recommending continuing diuresis  and planning Cath on Monday  -Repeat chest x-ray in a.m. -Repeat echocardiogram as below showed a EF of 60 to 65% with normal LV function mildly increased LVH along with impaired diastolic parameters consistent with grade 1 diastolic dysfunction -Continue supplemental oxygen and wean off of BiPAP to nasal cannula to her home 4 L and now is on 4 Liters -Continuous pulse oximetry and maintain O2 saturations  greater than 90 -We will need ambulatory home O2 screen prior to discharge -Continues to be volume overloaded so cardiology recommends continue IV Lasix and plan is for cath on Monday  Acute Encephalopathy likely in the setting of hypercarbia rule out other etiologies, improved -Remains somewhat confused but does have donepezil on her MAR and question if she has a history of dementia; per neurology records she does have some memory impairment which has been progressively gotten worse -Check TSH 0.834; RPR was nonreactive and B12 level was 384 -Correct hypercarbia and metabolic issues if still not improving may consider further imaging with EEG and head CT and possible MRI will hold off for now since she did improve some -Obtain SLP evaluation and she is at a mild aspiration risk and they recommend regular diet with thin liquids -Treat infectious causes with pneumonia and continue antibiotics for now -If not improving and will obtain further imaging  Left LE Asymmetry -Left lower extremity appears circumferentially larger than right.   -Will obtain left DVT venous Doppler ultrasound and did not show DVT  CAD s/p multiple stents  Chest Pain; intermittent  -Decision made by family with cardiology on 1/26 to hold aspirin  and plavix due to recent acute on chronic anemia -Initially holding her metoprolol and losartan for her borderline blood pressure and n.p.o. status given her confusion but both have improved  -Resumed her atorvastatin 80 mg p.o. daily now since she is safe enough to swallow along with her losartan 25 mg daily and metoprolol succinate 25 mg p.o. daily -Admitted intermittent chest pain on my examination and states she is not SOB but has some chest tightness which is midsternal that comes and goes -Obtain EKG and we will add Nitropaste 1 inch every 6 as needed -Cardiology is consulted for further evaluation recommendations and they are planning a cardiac catheterization; patient's  aspirin and Plavix is currently on hold due to concern for GI bleeding -EGD done and showed no evidence of any GI bleeding -Currently on Heparin gtt -We will need to review EKG and it shows any dynamic changes will need to call Cardiology sooner for catheterization.  Acute COPD exacerbation -As above -Patient takes albuterol inhalers as well as Symbicort and albuterol nebulizers in the outpatient setting as well as to Spiriva -Continue with steroids, nebs, supplemental oxygen, as well as antibiotics as above -Currently is on Ipratropium/Xopenex 3 times daily  -C/w Solu-Medrol 40 mg daily and change to po Prednisone taper in AM -C/w along with 4 L supplemental oxygen via nasal cannula  -Stopped antibiotic coverage with cefepime and vancomycin as do not clinically feel that she has a pneumonia  Acute on Chronic Diastolic CHF -As above -ECHOCardiogram repeated as below  -She takes furosemide 40 mg p.o. twice daily at home -Also takes losartan 25 mg p.o. daily, metoprolol tartrate 25 mg p.o. daily this has been resumed -Cardiology increased her diuresis to IV 40 mg BID and will continue today  -Strict I's and O's and daily weights -Continue to monitor for signs and symptoms of volume overload and repeat echocardiogram currently -Repeat chest x-ray showed increasing pulmonary edema -Question of A. fib worsened her heart failure -Patient is -2.907 L since admission and weight is not done -Cardiology consulted for further evaluation recommendations  GERD -Takes omeprazole 40 mg p.o. daily before breakfast and will resume with Hospital Substitution   History of Breast Cancer -She is status post chemotherapy and radiation -Currently on letrozole and this has been resumed  Normocytic Anemia  -Appears chronic in nature -Patient hemoglobin/hematocrit on admission was 10.2/30.0 and then dropped to 8.4/29.5 -> 8.2/27.1 -> 7.3/24.4 -> 9.6/30.2 after 1 unit of PRBCs -Checked anemia panel and  showed an iron level of 55, U IBC of 267, TIBC of 322, saturation ratios of 17%, ferritin level 288, folate level 29.2, vitamin B12 384 -Continue to monitor for signs and symptoms of bleeding -She is being started on anticoagulation with heparin drip for her atrial fibrillation with RVR; currently no overt bleeding noted -Repeat CBC in a.m.  -A decision was made with the patient and the patient's cardiologist to hold her aspirin and Plavix due to recent acute on chronic anemia -Patient is being started on anticoagulation with IV heparin by cardiology and will need to continue for now given her elevated score risk -Gastroenterology consulted for further evaluation they recommended continuing PPI and took the patient for endoscopy today which was normal; GI to reach out to cardiology to see if cardiology requires any further GI testing prior to her catheterization -Do not clinically feel that she has hemolysis given normal bilirubin and I do not feel that this is an issue of bone marrow production currently given normal  reticulocyte count -Because her hemoglobin dropped slightly yesterday hematology oncology was consulted for further evaluation and have ordered SPEP and IFE's with light chain levels as well as reviewed the peripheral smear which showed no significant schistocytes  -Hematology recommended keeping hemoglobin above 8 due to heart disease and will type and screen and transfuse 1 unit PRBCs given that her hemoglobin/hematocrit was 7.3/24.4 yesterday morning and has now improved to 9.6/3.2 after 1 unit PRBCs -If hemoglobin/hematocrit remains persistently low in the outpatient setting then oncology will consider a bone marrow biopsy to rule out MDS -continue monitor and repeat CBC in a.m.  Thrombocytopenia  -Unclear etiology but could be infectious in the setting of her pneumonia.  -Platelet count is now improved and is 165,000 -No signs of active bleed. Continue to monitor for signs and  symptoms of bleeding -Repeat CBC in the a.m.  Hyponatremia -Mild secondary to diuresis and was 133 this morning -Continue to monitor and trend and repeat CMP in a.m.  Hypokalemia -Patient's potassium this morning was 3.7 -Yesterday was low in the setting of diuresis -Continue to monitor and replete as necessary -Repeat CMP in a.m.  Hypophosphatemia -Patient's phosphorus level this morning was 2.0 -Replete with p.o. K-Phos Neutral 500 mg p.o. twice daily x2 doses -Continue to monitor and replete as necessary -Repeat phosphorus level in the a.m.  Paroxysmal Atrial Fibrillation -Not on anticoagulation previously but now has been started.    Went into RVR early yesterday morning morning with rates in the 150s and and spontaneously converted to normal sinus rhythm and sinus tachycardia -Currently held metoprolol, diltiazem for now until she can safely swallow but now that she is passed she can resume and this is been restarted -Continue with Telemetry monitoring -Cardiology consulted for further evaluation recommendations -Cardiology started the patient on IV heparin drip given her elevated CHA2DS2-VASc of 5 -Cardiology recommending GI evaluation -Further care per cardiology and recommending trending of heparin at 6 AM prior to EGD  Hyperbilirubinemia, improved -T bili was 2.0 and improved to 0.7 and today was 0.9 -Continue monitor and trend and repeat CMP in a.m.  Hyperglycemia in the setting of Pre-Diabetes -In the setting of steroid demargination  -Checked Hemoglobin A1c and was 6.2  -Patient's blood sugars have been ranging from 243-279 and blood sugar this morning on CMP was 322 -Will add Moderate NovoLog/scale insulin before meals and at bedtime  DVT prophylaxis: SCDs and now on a heparin drip Code Status: FULL CODE  Family Communication: No family present at bedside Disposition Plan: Remain Inpatient for continued workup and improvement of Respiratory Status and Euvolemic  State. Will need PT/OT to evaluate and treat and cardiac clearance prior to discharge now they are being consulted for further evaluation  Consultants:   Cardiology  Gastroenterology  Hematology oncology   Procedures:  ECHOCARDIOGRAM  IMPRESSIONS    1. Left ventricular ejection fraction, by visual estimation, is 60 to  65%. The left ventricle has normal function. There is mildly increased  left ventricular hypertrophy.  2. Left ventricular diastolic parameters are consistent with Grade I  diastolic dysfunction (impaired relaxation).  3. The left ventricle has no regional wall motion abnormalities.  4. Global right ventricle has normal systolic function.The right  ventricular size is normal. No increase in right ventricular wall  thickness.  5. Left atrial size was normal.  6. Right atrial size was normal.  7. The mitral valve is normal in structure. No evidence of mitral valve  regurgitation.  8. The tricuspid valve  is grossly normal.  9. The tricuspid valve is grossly normal. Tricuspid valve regurgitation  is trivial.  10. The aortic valve is normal in structure. Aortic valve regurgitation is  not visualized.  11. The pulmonic valve was normal in structure. Pulmonic valve  regurgitation is not visualized.  12. Moderately elevated pulmonary artery systolic pressure.  13. Pulmonary hypertension is moderate.  14. The atrial septum is grossly normal.   FINDINGS  Left Ventricle: Left ventricular ejection fraction, by visual estimation,  is 60 to 65%. The left ventricle has normal function. The left ventricle  has no regional wall motion abnormalities. There is mildly increased left  ventricular hypertrophy. Left  ventricular diastolic parameters are consistent with Grade I diastolic  dysfunction (impaired relaxation).   Right Ventricle: The right ventricular size is normal. No increase in  right ventricular wall thickness. Global RV systolic function is has    normal systolic function. The tricuspid regurgitant velocity is 2.76 m/s,  and with an assumed right atrial pressure  of 15 mmHg, the estimated right ventricular systolic pressure is  moderately elevated at 45.5 mmHg.   Left Atrium: Left atrial size was normal in size.   Right Atrium: Right atrial size was normal in size   Pericardium: There is no evidence of pericardial effusion.   Mitral Valve: The mitral valve is normal in structure. No evidence of  mitral valve regurgitation.   Tricuspid Valve: The tricuspid valve is grossly normal. Tricuspid valve  regurgitation is trivial.   Aortic Valve: The aortic valve is normal in structure. Aortic valve  regurgitation is not visualized.   Pulmonic Valve: The pulmonic valve was normal in structure. Pulmonic valve  regurgitation is not visualized. Pulmonic regurgitation is not visualized.   Aorta: The aortic root and ascending aorta are structurally normal, with  no evidence of dilitation.   Pulmonary Artery: Pulmonary hypertension is moderate.   IAS/Shunts: The atrial septum is grossly normal.     LEFT VENTRICLE  PLAX 2D  LVIDd:     4.50 cm    Diastology  LVIDs:     3.10 cm    LV e' lateral:  5.87 cm/s  LV PW:     1.40 cm    LV E/e' lateral: 6.7  LV IVS:    1.20 cm    LV e' medial:  5.44 cm/s  LVOT diam:   1.90 cm    LV E/e' medial: 7.2  LV SV:     55 ml  LV SV Index:  31.84  LVOT Area:   2.84 cm    LV Volumes (MOD)  LV area d, A2C:  18.10 cm  LV area d, A4C:  20.00 cm  LV area s, A2C:  10.50 cm  LV area s, A4C:  10.80 cm  LV major d, A2C:  6.61 cm  LV major d, A4C:  6.16 cm  LV major s, A2C:  5.20 cm  LV major s, A4C:  5.33 cm  LV vol d, MOD A2C: 41.0 ml  LV vol d, MOD A4C: 52.7 ml  LV vol s, MOD A2C: 17.2 ml  LV vol s, MOD A4C: 18.0 ml  LV SV MOD A2C:   23.8 ml  LV SV MOD A4C:   52.7 ml  LV SV MOD BP:   30.4 ml   RIGHT VENTRICLE        IVC  RV S prime:   12.10 cm/s IVC diam: 2.10 cm  TAPSE (M-mode): 1.8 cm   LEFT  ATRIUM       Index  LA diam:    2.80 cm 1.64 cm/m  LA Vol (A2C):  28.1 ml 16.43 ml/m  LA Vol (A4C):  20.6 ml 12.04 ml/m  LA Biplane Vol: 23.8 ml 13.91 ml/m  AORTIC VALVE  LVOT Vmax:  90.80 cm/s  LVOT Vmean: 63.900 cm/s  LVOT VTI:  0.153 m    AORTA  Ao Root diam: 3.10 cm   MITRAL VALVE            TRICUSPID VALVE  MV Area (PHT): 5.27 cm       TR Peak grad:  30.5 mmHg  MV PHT:    41.76 msec      TR Vmax:    291.00 cm/s  MV Decel Time: 144 msec  MV E velocity: 39.34 cm/s 103 cm/s SHUNTS  MV A velocity: 76.60 cm/s 70.3 cm/s Systemic VTI: 0.15 m  MV E/A ratio: 0.51    1.5    Systemic Diam: 1.90 cm   EGD 04/21/2019 Findings:      The examined esophagus was normal.      The entire examined stomach was normal.      The duodenal bulb, first portion of the duodenum and second portion of       the duodenum were normal. Impression:               - Normal esophagus.                           - Normal stomach.                           - Normal duodenal bulb, first portion of the                            duodenum and second portion of the duodenum.                           - No source of anemia seen on today's endoscopy. No                            old or fresh blood was identified on today's                            endoscopy.  Antimicrobials:  Anti-infectives (From admission, onward)   Start     Dose/Rate Route Frequency Ordered Stop   04/18/19 1000  vancomycin (VANCOREADY) IVPB 750 mg/150 mL  Status:  Discontinued     750 mg 150 mL/hr over 60 Minutes Intravenous Every 12 hours 04/18/19 0140 04/20/19 1925   04/18/19 0600  ceFEPIme (MAXIPIME) 2 g in sodium chloride 0.9 % 100 mL IVPB  Status:  Discontinued     2 g 200 mL/hr over 30 Minutes Intravenous Every 8 hours 04/18/19 0140 04/20/19 1925   04/17/19 2045  vancomycin (VANCOREADY)  IVPB 1250 mg/250 mL     1,250 mg 166.7 mL/hr over 90 Minutes Intravenous  Once 04/17/19 2040 04/18/19 0008   04/17/19 2045  piperacillin-tazobactam (ZOSYN) IVPB 3.375 g     3.375 g 100 mL/hr over 30 Minutes Intravenous  Once 04/17/19 2040 04/17/19 2154     Subjective: Seen and examined bedside and  states that she is not short of breath.  States that she does have some intermittent midsternal chest pain that feels like a tightness and states that she feels like her "bra is too tight" but none currently.  No lightheadedness or dizziness.  No other concerns or complaints at this time and awaiting for EGD to be done.  Objective: Vitals:   04/21/19 1334 04/21/19 1345 04/21/19 1355 04/21/19 1427  BP: (!) 125/46 (!) 143/58 (!) 156/68   Pulse: 98 92 90   Resp: 14 12 15    Temp:    98 F (36.7 C)  TempSrc:    Oral  SpO2: 95% 95% 96%   Weight:      Height:        Intake/Output Summary (Last 24 hours) at 04/21/2019 1538 Last data filed at 04/21/2019 1326 Gross per 24 hour  Intake 1595 ml  Output 3175 ml  Net -1580 ml   Filed Weights   04/19/19 0201 04/20/19 0554 04/21/19 0400  Weight: 62.8 kg 63.2 kg 63.2 kg   Examination: Physical Exam:  Constitutional: WN/WD Caucasian female currently no acute distress appears calm Eyes: Lids and conjunctivae normal, sclerae anicteric  ENMT: External Ears, Nose appear normal. Grossly normal hearing. Mucous membranes are moist.  Neck: Appears normal, supple, no cervical masses, normal ROM, no appreciable thyromegaly; no JVD Respiratory: Diminished to auscultation bilaterally, no wheezing, rales, rhonchi or crackles. Normal respiratory effort and patient is not tachypenic.  Unlabored breathing and no accessory muscle use and she is wearing 4 L of supplemental oxygen via nasal cannula Cardiovascular: RRR, no murmurs / rubs / gallops. S1 and S2 auscultated.  1+ extremity edema.  Abdomen: Soft, non-tender, mildly distended. Bowel sounds positive x4.  GU:  Deferred. Musculoskeletal: No clubbing / cyanosis of digits/nails. No joint deformity upper and lower extremities.  Skin: No rashes, lesions, ulcers on a limited skin evaluation. No induration; Warm and dry.  Neurologic: CN 2-12 grossly intact with no focal deficits. Romberg sign and cerebellar reflexes not assessed.  Psychiatric: Normal judgment and insight. Alert and oriented x 3. Normal mood and appropriate affect.   Data Reviewed: I have personally reviewed following labs and imaging studies  CBC: Recent Labs  Lab 04/17/19 1620 04/17/19 1620 04/17/19 2113 04/18/19 0608 04/19/19 0702 04/20/19 0509 04/21/19 0224  WBC 5.5  --   --  4.5 6.8 7.2 9.2  NEUTROABS  --   --   --   --  5.7 6.3 7.2  HGB 9.0*   < > 10.2* 8.4* 8.2* 7.3* 9.6*  HCT 31.3*   < > 30.0* 29.5* 27.1* 24.4* 30.2*  MCV 94.8  --   --  97.0 91.2 92.4 87.3  PLT 149*  --   --  124* 158 135* 165   < > = values in this interval not displayed.   Basic Metabolic Panel: Recent Labs  Lab 04/18/19 0608 04/18/19 1826 04/19/19 0515 04/20/19 0509 04/21/19 0224  NA 143 143 143 132* 133*  K 3.8 4.2 4.3 3.2* 3.7  CL 88* 88* 90* 83* 87*  CO2 45* 42* 44* 37* 35*  GLUCOSE 118* 183* 179* 210* 322*  BUN 18 24* 28* 23 28*  CREATININE 0.74 0.77 0.73 0.63 0.88  CALCIUM 8.9 8.8* 8.8* 7.4* 8.3*  MG  --   --  1.6* 1.7 2.3  PHOS  --   --  4.0 3.2 2.0*   GFR: Estimated Creatinine Clearance: 55.7 mL/min (by C-G formula based on SCr of 0.88  mg/dL). Liver Function Tests: Recent Labs  Lab 04/18/19 1826 04/19/19 0515 04/20/19 0509 04/20/19 1150 04/21/19 0224  AST 17 18 16 19 16   ALT 27 27 22 24 23   ALKPHOS 81 75 68 71 85  BILITOT 0.9 0.8 2.0* 0.7 0.9  PROT 6.0* 6.0* 5.0* 5.6* 5.7*  ALBUMIN 2.9* 3.0* 2.4* 2.7* 2.7*   No results for input(s): LIPASE, AMYLASE in the last 168 hours. No results for input(s): AMMONIA in the last 168 hours. Coagulation Profile: No results for input(s): INR, PROTIME in the last 168 hours. Cardiac  Enzymes: No results for input(s): CKTOTAL, CKMB, CKMBINDEX, TROPONINI in the last 168 hours. BNP (last 3 results) Recent Labs    04/15/19 1225  PROBNP 711*   HbA1C: Recent Labs    04/21/19 0911  HGBA1C 6.2*   CBG: Recent Labs  Lab 04/20/19 1708 04/21/19 1413  GLUCAP 279* 243*   Lipid Profile: No results for input(s): CHOL, HDL, LDLCALC, TRIG, CHOLHDL, LDLDIRECT in the last 72 hours. Thyroid Function Tests: Recent Labs    04/19/19 0515  TSH 0.834   Anemia Panel: Recent Labs    04/19/19 0515 04/19/19 1156 04/19/19 1209 04/20/19 0930  VITAMINB12 384  --   --   --   FOLATE  --  29.2  --   --   FERRITIN  --  288  --   --   TIBC  --  322  --   --   IRON  --  55  --   --   RETICCTPCT  --   --  1.9 2.0   Sepsis Labs: Recent Labs  Lab 04/18/19 0005  PROCALCITON <0.10    Recent Results (from the past 240 hour(s))  Blood culture (routine x 2)     Status: None (Preliminary result)   Collection Time: 04/17/19  8:50 PM   Specimen: BLOOD  Result Value Ref Range Status   Specimen Description BLOOD RIGHT ARM  Final   Special Requests   Final    BOTTLES DRAWN AEROBIC AND ANAEROBIC Blood Culture results may not be optimal due to an inadequate volume of blood received in culture bottles   Culture   Final    NO GROWTH 4 DAYS Performed at [facility], [place] [address], [place], [place] [address]    Report Status PENDING  Incomplete  Blood culture (routine x 2)     Status: None (Preliminary result)   Collection Time: 04/17/19  9:07 PM   Specimen: BLOOD  Result Value Ref Range Status   Specimen Description BLOOD RIGHT FOREARM  Final   Special Requests   Final    BOTTLES DRAWN AEROBIC AND ANAEROBIC Blood Culture adequate volume   Culture   Final    NO GROWTH 4 DAYS Performed at [facility], [place] [address] [place], [place] [address]    Report Status PENDING  Incomplete  Respiratory Panel by RT PCR (Flu A&B, Covid) - Nasopharyngeal Swab     Status: None    Collection Time: 04/17/19  9:40 PM   Specimen: Nasopharyngeal Swab  Result Value Ref Range Status   SARS Coronavirus 2 by RT PCR NEGATIVE NEGATIVE Final    Comment: (NOTE) SARS-CoV-2 target nucleic acids are NOT DETECTED. The SARS-CoV-2 RNA is generally detectable in upper respiratoy specimens during the acute phase of infection. The lowest concentration of SARS-CoV-2 viral copies this assay can detect is 131 copies/mL. A negative result does not preclude SARS-Cov-2 infection and should not  be used as the sole basis for treatment or other patient management decisions. A negative result may occur with  improper specimen collection/handling, submission of specimen other than nasopharyngeal swab, presence of viral mutation(s) within the areas targeted by this assay, and inadequate number of viral copies (<131 copies/mL). A negative result must be combined with clinical observations, patient history, and epidemiological information. The expected result is Negative. Fact Sheet for Patients:  PinkCheek.be Fact Sheet for Healthcare Providers:  GravelBags.it This test is not yet ap proved or cleared by the [place] FDA and  has been authorized for detection and/or diagnosis of SARS-CoV-2 by FDA under an Emergency Use Authorization (EUA). This EUA will remain  in effect (meaning this test can be used) for the duration of the COVID-19 declaration under Section 564(b)(1) of the Act, 21 U.S.C. section 360bbb-3(b)(1), unless the authorization is terminated or revoked sooner.    Influenza A by PCR NEGATIVE NEGATIVE Final   Influenza B by PCR NEGATIVE NEGATIVE Final    Comment: (NOTE) The Xpert Xpress SARS-CoV-2/FLU/RSV assay is intended as an aid in  the diagnosis of influenza from Nasopharyngeal swab specimens and  should not be used as a sole basis for treatment. Nasal washings and  aspirates are unacceptable for Xpert Xpress  SARS-CoV-2/FLU/RSV  testing. Fact Sheet for Patients: PinkCheek.be Fact Sheet for Healthcare Providers: GravelBags.it This test is not yet approved or cleared by the [place] FDA and  has been authorized for detection and/or diagnosis of SARS-CoV-2 by  FDA under an Emergency Use Authorization (EUA). This EUA will remain  in effect (meaning this test can be used) for the duration of the  Covid-19 declaration under Section 564(b)(1) of the Act, 21  U.S.C. section 360bbb-3(b)(1), unless the authorization is  terminated or revoked. Performed at [facility], [place] [address], [place], [place] [address]   SARS CORONAVIRUS 2 (TAT 6-24 HRS) Nasopharyngeal Nasopharyngeal Swab     Status: None   Collection Time: 04/18/19 12:35 AM   Specimen: Nasopharyngeal Swab  Result Value Ref Range Status   SARS Coronavirus 2 NEGATIVE NEGATIVE Final    Comment: (NOTE) SARS-CoV-2 target nucleic acids are NOT DETECTED. The SARS-CoV-2 RNA is generally detectable in upper and lower respiratory specimens during the acute phase of infection. Negative results do not preclude SARS-CoV-2 infection, do not rule out co-infections with other pathogens, and should not be used as the sole basis for treatment or other patient management decisions. Negative results must be combined with clinical observations, patient history, and epidemiological information. The expected result is Negative. Fact Sheet for Patients: SugarRoll.be Fact Sheet for Healthcare Providers: https://www.woods-mathews.com/ This test is not yet approved or cleared by the [place] FDA and  has been authorized for detection and/or diagnosis of SARS-CoV-2 by FDA under an Emergency Use Authorization (EUA). This EUA will remain  in effect (meaning this test can be used) for the duration of the COVID-19 declaration under Section 56 4(b)(1) of  the Act, 21 U.S.C. section 360bbb-3(b)(1), unless the authorization is terminated or revoked sooner. Performed at [facility], [place] [address] [place], [place] [address]   MRSA PCR Screening     Status: None   Collection Time: 04/20/19  8:21 AM   Specimen: Nasal Swab; Nasopharyngeal  Result Value Ref Range Status   MRSA by PCR NEGATIVE NEGATIVE Final    Comment:        The GeneXpert MRSA Assay (FDA approved for NASAL specimens only), is one component  of a comprehensive MRSA colonization surveillance program. It is not intended to diagnose MRSA infection nor to guide or monitor treatment for MRSA infections. Performed at [facility], [place] [address], [place], [place] [id number]     Radiology Studies: DG CHEST PORT 1 VIEW  Result Date: 04/21/2019 CLINICAL DATA:  Shortness of breath EXAM: PORTABLE CHEST 1 VIEW COMPARISON:  Chest radiograph 04/20/2019 FINDINGS: Monitoring leads overlie the patient. Stable cardiac and mediastinal contours. Aortic atherosclerosis. Emphysematous change. Similar bilateral mid and lower lung interstitial opacities. No pleural effusion or pneumothorax. IMPRESSION: Similar bilateral mid and lower lung interstitial opacities which may represent edema. Emphysema. Electronically Signed   By: Lovey Newcomer M.D.   On: 04/21/2019 07:17   DG CHEST PORT 1 VIEW  Result Date: 04/20/2019 CLINICAL DATA:  Shortness of breath EXAM: PORTABLE CHEST 1 VIEW COMPARISON:  04/19/2019 FINDINGS: Heart size remains normal. Chronic aortic atherosclerosis. Chronic changes of upper lung predominant emphysema with relative upper lung lucency. Slight increase in interstitial prominence in the lower lungs suggest mild interstitial edema. No alveolar edema. No consolidation, collapse or effusion. IMPRESSION: Slight increased prominence of interstitial markings in the lower lungs suggests interstitial edema. Background emphysema. Aortic atherosclerosis. Electronically Signed   By:  Nelson Chimes M.D.   On: 04/20/2019 07:46   ECHOCARDIOGRAM COMPLETE  Result Date: 04/19/2019   ECHOCARDIOGRAM REPORT   Patient Name:   SHARISSA BRIERLEY Date of Exam: 04/19/2019 Medical Rec #:  [id number]       Height:       66.0 in Accession #:    [id number]      Weight:       138.4 lb Date of Birth:  1950/09/10       BSA:          1.71 m Patient Age:    41 years        BP:           125/61 mmHg Patient Gender: F               HR:           112 bpm. Exam Location:  Inpatient Procedure: 2D Echo, Cardiac Doppler and Color Doppler Indications:    R06.02 SOB  History:        Patient has prior history of Echocardiogram examinations, most                 recent 01/09/2015. Abnormal ECG, COPD and TIA,                 Signs/Symptoms:Shortness of Breath and Dyspnea; Risk                 Factors:Hypertension and Current Smoker. ETOH. Cyanosis.  Sonographer:    Roseanna Rainbow RDCS Referring Phys: [id number] [facility] LATIF [place]  1. Left ventricular ejection fraction, by visual estimation, is 60 to 65%. The left ventricle has normal function. There is mildly increased left ventricular hypertrophy.  2. Left ventricular diastolic parameters are consistent with Grade I diastolic dysfunction (impaired relaxation).  3. The left ventricle has no regional wall motion abnormalities.  4. Global right ventricle has normal systolic function.The right ventricular size is normal. No increase in right ventricular wall thickness.  5. Left atrial size was normal.  6. Right atrial size was normal.  7. The mitral valve is normal in structure. No evidence of mitral valve regurgitation.  8. The tricuspid valve is grossly normal.  9. The tricuspid valve  is grossly normal. Tricuspid valve regurgitation is trivial. 10. The aortic valve is normal in structure. Aortic valve regurgitation is not visualized. 11. The pulmonic valve was normal in structure. Pulmonic valve regurgitation is not visualized. 12. Moderately elevated pulmonary artery systolic  pressure. 13. Pulmonary hypertension is moderate. 14. The atrial septum is grossly normal. FINDINGS  Left Ventricle: Left ventricular ejection fraction, by visual estimation, is 60 to 65%. The left ventricle has normal function. The left ventricle has no regional wall motion abnormalities. There is mildly increased left ventricular hypertrophy. Left ventricular diastolic parameters are consistent with Grade I diastolic dysfunction (impaired relaxation). Right Ventricle: The right ventricular size is normal. No increase in right ventricular wall thickness. Global RV systolic function is has normal systolic function. The tricuspid regurgitant velocity is 2.76 m/s, and with an assumed right atrial pressure  of 15 mmHg, the estimated right ventricular systolic pressure is moderately elevated at 45.5 mmHg. Left Atrium: Left atrial size was normal in size. Right Atrium: Right atrial size was normal in size Pericardium: There is no evidence of pericardial effusion. Mitral Valve: The mitral valve is normal in structure. No evidence of mitral valve regurgitation. Tricuspid Valve: The tricuspid valve is grossly normal. Tricuspid valve regurgitation is trivial. Aortic Valve: The aortic valve is normal in structure. Aortic valve regurgitation is not visualized. Pulmonic Valve: The pulmonic valve was normal in structure. Pulmonic valve regurgitation is not visualized. Pulmonic regurgitation is not visualized. Aorta: The aortic root and ascending aorta are structurally normal, with no evidence of dilitation. Pulmonary Artery: Pulmonary hypertension is moderate. IAS/Shunts: The atrial septum is grossly normal.  LEFT VENTRICLE PLAX 2D LVIDd:         4.50 cm       Diastology LVIDs:         3.10 cm       LV e' lateral:   5.87 cm/s LV PW:         1.40 cm       LV E/e' lateral: 6.7 LV IVS:        1.20 cm       LV e' medial:    5.44 cm/s LVOT diam:     1.90 cm       LV E/e' medial:  7.2 LV SV:         55 ml LV SV Index:   31.84 LVOT  Area:     2.84 cm  LV Volumes (MOD) LV area d, A2C:    18.10 cm LV area d, A4C:    20.00 cm LV area s, A2C:    10.50 cm LV area s, A4C:    10.80 cm LV major d, A2C:   6.61 cm LV major d, A4C:   6.16 cm LV major s, A2C:   5.20 cm LV major s, A4C:   5.33 cm LV vol d, MOD A2C: 41.0 ml LV vol d, MOD A4C: 52.7 ml LV vol s, MOD A2C: 17.2 ml LV vol s, MOD A4C: 18.0 ml LV SV MOD A2C:     23.8 ml LV SV MOD A4C:     52.7 ml LV SV MOD BP:      30.4 ml RIGHT VENTRICLE             IVC RV S prime:     12.10 cm/s  IVC diam: 2.10 cm TAPSE (M-mode): 1.8 cm LEFT ATRIUM             Index LA diam:  2.80 cm 1.64 cm/m LA Vol (A2C):   28.1 ml 16.43 ml/m LA Vol (A4C):   20.6 ml 12.04 ml/m LA Biplane Vol: 23.8 ml 13.91 ml/m  AORTIC VALVE LVOT Vmax:   90.80 cm/s LVOT Vmean:  63.900 cm/s LVOT VTI:    0.153 m  AORTA Ao Root diam: 3.10 cm MITRAL VALVE                        TRICUSPID VALVE MV Area (PHT): 5.27 cm             TR Peak grad:   30.5 mmHg MV PHT:        41.76 msec           TR Vmax:        291.00 cm/s MV Decel Time: 144 msec MV E velocity: 39.34 cm/s 103 cm/s  SHUNTS MV A velocity: 76.60 cm/s 70.3 cm/s Systemic VTI:  0.15 m MV E/A ratio:  0.51       1.5       Systemic Diam: 1.90 cm  Mertie Moores MD Electronically signed by Mertie Moores MD Signature Date/Time: 04/19/2019/5:07:55 PM    Final    Scheduled Meds:  atorvastatin  80 mg Oral Daily   diltiazem  180 mg Oral Daily   donepezil  5 mg Oral QHS   DULoxetine  60 mg Oral Daily   furosemide  40 mg Intravenous Q12H   gabapentin  300 mg Oral TID   insulin aspart  0-15 Units Subcutaneous TID WC   insulin aspart  0-5 Units Subcutaneous QHS   ipratropium  0.5 mg Nebulization TID   letrozole  2.5 mg Oral Daily   levalbuterol  0.63 mg Nebulization TID   losartan  25 mg Oral Daily   methylPREDNISolone (SOLU-MEDROL) injection  40 mg Intravenous Daily   mometasone-formoterol  2 puff Inhalation BID   pantoprazole  40 mg Oral Daily   phosphorus   500 mg Oral BID   sodium chloride flush  3 mL Intravenous Once   sodium chloride flush  3 mL Intravenous Q12H   Continuous Infusions:  heparin 1,300 Units/hr (04/21/19 1454)   lactated ringers 20 mL/hr at 04/21/19 1213    LOS: 4 days   Kerney Elbe, DO [facility] PAGER is on AMION  If 7PM-7AM, please contact night-coverage [email or web address]

## 2019-04-22 ENCOUNTER — Encounter (HOSPITAL_COMMUNITY)
Admission: EM | Disposition: A | Discharge status: Home / Self Care | Payer: Self-pay | Source: Home / Self Care | Attending: Internal Medicine

## 2019-04-22 ENCOUNTER — Inpatient Hospital Stay (HOSPITAL_COMMUNITY): Payer: Medicare Other

## 2019-04-22 DIAGNOSIS — I25110 Atherosclerotic heart disease of native coronary artery with unstable angina pectoris: Secondary | ICD-10-CM

## 2019-04-22 HISTORY — PX: CORONARY STENT INTERVENTION: CATH118234

## 2019-04-22 HISTORY — PX: LEFT HEART CATH AND CORONARY ANGIOGRAPHY: CATH118249

## 2019-04-22 LAB — CBC WITH DIFFERENTIAL/PLATELET
Abs Immature Granulocytes: 0.14 10*3/uL — ABNORMAL HIGH (ref 0.00–0.07)
Basophils Absolute: 0 10*3/uL (ref 0.0–0.1)
Basophils Relative: 0 %
Eosinophils Absolute: 0.1 10*3/uL (ref 0.0–0.5)
Eosinophils Relative: 1 %
HCT: 31.6 % — ABNORMAL LOW (ref 36.0–46.0)
Hemoglobin: 9.9 g/dL — ABNORMAL LOW (ref 12.0–15.0)
Immature Granulocytes: 1 %
Lymphocytes Relative: 15 %
Lymphs Abs: 1.5 10*3/uL (ref 0.7–4.0)
MCH: 28 pg (ref 26.0–34.0)
MCHC: 31.3 g/dL (ref 30.0–36.0)
MCV: 89.3 fL (ref 80.0–100.0)
Monocytes Absolute: 0.9 10*3/uL (ref 0.1–1.0)
Monocytes Relative: 9 %
Neutro Abs: 7.4 10*3/uL (ref 1.7–7.7)
Neutrophils Relative %: 74 %
Platelets: 189 10*3/uL (ref 150–400)
RBC: 3.54 MIL/uL — ABNORMAL LOW (ref 3.87–5.11)
RDW: 19.8 % — ABNORMAL HIGH (ref 11.5–15.5)
WBC: 10.1 10*3/uL (ref 4.0–10.5)
nRBC: 0.2 % (ref 0.0–0.2)

## 2019-04-22 LAB — GLUCOSE, CAPILLARY
Glucose-Capillary: 183 mg/dL — ABNORMAL HIGH (ref 70–99)
Glucose-Capillary: 103 mg/dL — ABNORMAL HIGH (ref 70–99)
Glucose-Capillary: 106 mg/dL — ABNORMAL HIGH (ref 70–99)
Glucose-Capillary: 224 mg/dL — ABNORMAL HIGH (ref 70–99)
Glucose-Capillary: 138 mg/dL — ABNORMAL HIGH (ref 70–99)

## 2019-04-22 LAB — COMPREHENSIVE METABOLIC PANEL
ALT: 22 U/L (ref 0–44)
AST: 18 U/L (ref 15–41)
Albumin: 2.8 g/dL — ABNORMAL LOW (ref 3.5–5.0)
Alkaline Phosphatase: 96 U/L (ref 38–126)
Anion gap: 13 (ref 5–15)
BUN: 29 mg/dL — ABNORMAL HIGH (ref 8–23)
CO2: 33 mmol/L — ABNORMAL HIGH (ref 22–32)
Calcium: 8.4 mg/dL — ABNORMAL LOW (ref 8.9–10.3)
Chloride: 89 mmol/L — ABNORMAL LOW (ref 98–111)
Creatinine, Ser: 0.77 mg/dL (ref 0.44–1.00)
GFR calc Af Amer: >60 mL/min (ref >60)
GFR calc non Af Amer: >60 mL/min (ref >60)
Glucose, Bld: 209 mg/dL — ABNORMAL HIGH (ref 70–99)
Potassium: 3.7 mmol/L (ref 3.5–5.1)
Sodium: 135 mmol/L (ref 135–145)
Total Bilirubin: 0.6 mg/dL (ref 0.3–1.2)
Total Protein: 5.6 g/dL — ABNORMAL LOW (ref 6.5–8.1)

## 2019-04-22 LAB — PHOSPHORUS: Phosphorus: 2.3 mg/dL — ABNORMAL LOW (ref 2.5–4.6)

## 2019-04-22 LAB — CULTURE, BLOOD (ROUTINE X 2)
Culture: NO GROWTH
Culture: NO GROWTH
Special Requests: ADEQUATE

## 2019-04-22 LAB — KAPPA/LAMBDA LIGHT CHAINS
Kappa free light chain: 13.9 mg/L (ref 3.3–19.4)
Kappa, lambda light chain ratio: 0.99 (ref 0.26–1.65)
Lambda free light chains: 14 mg/L (ref 5.7–26.3)

## 2019-04-22 LAB — HEPARIN LEVEL (UNFRACTIONATED)
Heparin Unfractionated: 0.55 IU/mL (ref 0.30–0.70)
Heparin Unfractionated: 0.64 IU/mL (ref 0.30–0.70)

## 2019-04-22 LAB — MAGNESIUM: Magnesium: 1.9 mg/dL (ref 1.7–2.4)

## 2019-04-22 LAB — POCT ACTIVATED CLOTTING TIME: Activated Clotting Time: 389 seconds

## 2019-04-22 SURGERY — LEFT HEART CATH AND CORONARY ANGIOGRAPHY
Anesthesia: LOCAL

## 2019-04-22 MED ORDER — HEPARIN (PORCINE) IN NACL 1000-0.9 UT/500ML-% IV SOLN
INTRAVENOUS | Status: DC | PRN
  Administered 2019-04-22 (×4): 500 mL

## 2019-04-22 MED ORDER — MIDAZOLAM HCL 2 MG/2ML IJ SOLN
INTRAMUSCULAR | Status: AC
  Filled 2019-04-22: qty 2

## 2019-04-22 MED ORDER — SODIUM CHLORIDE 0.9 % IV SOLN
INTRAVENOUS | Status: DC
  Administered 2019-04-22: 22:00:00 50 mL/h via INTRAVENOUS

## 2019-04-22 MED ORDER — FENTANYL CITRATE (PF) 100 MCG/2ML IJ SOLN
INTRAMUSCULAR | Status: DC | PRN
  Administered 2019-04-22: 25 ug via INTRAVENOUS

## 2019-04-22 MED ORDER — HEPARIN (PORCINE) IN NACL 1000-0.9 UT/500ML-% IV SOLN
INTRAVENOUS | Status: AC
  Filled 2019-04-22: qty 500

## 2019-04-22 MED ORDER — BIVALIRUDIN BOLUS VIA INFUSION - CUPID
INTRAVENOUS | Status: DC | PRN
  Administered 2019-04-22: 46.875 mg via INTRAVENOUS

## 2019-04-22 MED ORDER — CLOPIDOGREL BISULFATE 300 MG PO TABS
ORAL_TABLET | ORAL | Status: DC | PRN
  Administered 2019-04-22: 600 mg via ORAL

## 2019-04-22 MED ORDER — CLOPIDOGREL BISULFATE 300 MG PO TABS
ORAL_TABLET | ORAL | Status: AC
  Filled 2019-04-22: qty 1

## 2019-04-22 MED ORDER — IOHEXOL 350 MG/ML SOLN
INTRAVENOUS | Status: DC | PRN
  Administered 2019-04-22: 165 mL via INTRA_ARTERIAL

## 2019-04-22 MED ORDER — SODIUM CHLORIDE 0.9 % IV SOLN
INTRAVENOUS | Status: AC | PRN
  Administered 2019-04-22: 1.75 mg/kg/h via INTRAVENOUS

## 2019-04-22 MED ORDER — FENTANYL CITRATE (PF) 100 MCG/2ML IJ SOLN
INTRAMUSCULAR | Status: AC
  Filled 2019-04-22: qty 2

## 2019-04-22 MED ORDER — SODIUM CHLORIDE 0.9 % IV SOLN: 250.0000 mL | INTRAVENOUS | Status: DC | PRN

## 2019-04-22 MED ORDER — HEPARIN (PORCINE) IN NACL 1000-0.9 UT/500ML-% IV SOLN
INTRAVENOUS | Status: AC
  Filled 2019-04-22: qty 1500

## 2019-04-22 MED ORDER — ACETAMINOPHEN 325 MG PO TABS: 650.0000 mg | ORAL_TABLET | ORAL | Status: DC | PRN

## 2019-04-22 MED ORDER — LABETALOL HCL 5 MG/ML IV SOLN
10.0000 mg | INTRAVENOUS | Status: AC | PRN
  Administered 2019-04-22: 13:00:00 10 mg via INTRAVENOUS

## 2019-04-22 MED ORDER — SODIUM CHLORIDE 0.9% FLUSH: 3.0000 mL | INTRAVENOUS | Status: DC | PRN

## 2019-04-22 MED ORDER — ASPIRIN 81 MG PO CHEW
81.0000 mg | CHEWABLE_TABLET | Freq: Every day | ORAL | Status: DC
  Administered 2019-04-23: 09:00:00 81 mg via ORAL
  Filled 2019-04-22: qty 1

## 2019-04-22 MED ORDER — SODIUM CHLORIDE 0.9 % WEIGHT BASED INFUSION
1.0000 mL/kg/h | INTRAVENOUS | Status: DC
  Administered 2019-04-22: 08:00:00 1 mL/kg/h via INTRAVENOUS

## 2019-04-22 MED ORDER — LIDOCAINE HCL (PF) 1 % IJ SOLN
INTRAMUSCULAR | Status: AC
  Filled 2019-04-22: qty 30

## 2019-04-22 MED ORDER — SODIUM CHLORIDE 0.9% FLUSH
3.0000 mL | Freq: Two times a day (BID) | INTRAVENOUS | Status: DC
  Administered 2019-04-22 – 2019-04-23 (×2): 3 mL via INTRAVENOUS

## 2019-04-22 MED ORDER — ASPIRIN 81 MG PO CHEW
81.0000 mg | CHEWABLE_TABLET | ORAL | Status: AC
  Administered 2019-04-22: 05:00:00 81 mg via ORAL
  Filled 2019-04-22: qty 1

## 2019-04-22 MED ORDER — HEPARIN (PORCINE) 25000 UT/250ML-% IV SOLN
1300.0000 [IU]/h | INTRAVENOUS | Status: DC
  Administered 2019-04-22: 19:00:00 1300 [IU]/h via INTRAVENOUS
  Filled 2019-04-22: qty 250

## 2019-04-22 MED ORDER — NITROGLYCERIN 1 MG/10 ML FOR IR/CATH LAB
INTRA_ARTERIAL | Status: DC | PRN
  Administered 2019-04-22: 100 ug via INTRACORONARY
  Administered 2019-04-22 (×3): 200 ug via INTRACORONARY

## 2019-04-22 MED ORDER — MIDAZOLAM HCL 2 MG/2ML IJ SOLN
INTRAMUSCULAR | Status: DC | PRN
  Administered 2019-04-22 (×3): 1 mg via INTRAVENOUS

## 2019-04-22 MED ORDER — CLOPIDOGREL BISULFATE 75 MG PO TABS
75.0000 mg | ORAL_TABLET | Freq: Every day | ORAL | Status: DC
  Administered 2019-04-23: 09:00:00 75 mg via ORAL
  Filled 2019-04-22: qty 1

## 2019-04-22 MED ORDER — HYDRALAZINE HCL 20 MG/ML IJ SOLN: 10.0000 mg | INTRAMUSCULAR | Status: AC | PRN

## 2019-04-22 MED ORDER — BIVALIRUDIN TRIFLUOROACETATE 250 MG IV SOLR
INTRAVENOUS | Status: AC
  Filled 2019-04-22: qty 250

## 2019-04-22 MED ORDER — SODIUM CHLORIDE 0.9 % WEIGHT BASED INFUSION
3.0000 mL/kg/h | INTRAVENOUS | Status: DC
  Administered 2019-04-22: 05:00:00 3 mL/kg/h via INTRAVENOUS

## 2019-04-22 MED ORDER — DIAZEPAM 5 MG PO TABS: 5.0000 mg | ORAL_TABLET | Freq: Four times a day (QID) | ORAL | Status: DC | PRN

## 2019-04-22 MED ORDER — ATORVASTATIN CALCIUM 80 MG PO TABS: 80.0000 mg | ORAL_TABLET | Freq: Every day | ORAL | Status: DC

## 2019-04-22 MED ORDER — LABETALOL HCL 5 MG/ML IV SOLN
INTRAVENOUS | Status: AC
  Filled 2019-04-22: qty 4

## 2019-04-22 MED ORDER — LIDOCAINE HCL (PF) 1 % IJ SOLN
INTRAMUSCULAR | Status: DC | PRN
  Administered 2019-04-22: 20 mL via INTRADERMAL

## 2019-04-22 MED ORDER — K PHOS MONO-SOD PHOS DI & MONO 155-852-130 MG PO TABS
500.0000 mg | ORAL_TABLET | Freq: Two times a day (BID) | ORAL | Status: AC
  Administered 2019-04-22: 22:00:00 500 mg via ORAL
  Filled 2019-04-22 (×2): qty 2

## 2019-04-22 MED ORDER — ONDANSETRON HCL 4 MG/2ML IJ SOLN: 4.0000 mg | Freq: Four times a day (QID) | INTRAMUSCULAR | Status: DC | PRN

## 2019-04-22 MED ORDER — NITROGLYCERIN 1 MG/10 ML FOR IR/CATH LAB
INTRA_ARTERIAL | Status: AC
  Filled 2019-04-22: qty 10

## 2019-04-22 SURGICAL SUPPLY — 16 items
BALLN SAPPHIRE ~~LOC~~ 3.25X15 (BALLOONS) ×1 IMPLANT
BALLN WOLVERINE 2.50X10 (BALLOONS) ×2
BALLOON WOLVERINE 2.50X10 (BALLOONS) IMPLANT
CATH INFINITI 5FR MULTPACK ANG (CATHETERS) ×1 IMPLANT
CATH VISTA GUIDE 6FR XB3.5 (CATHETERS) ×1 IMPLANT
DEVICE CONTINUOUS FLUSH (MISCELLANEOUS) ×1 IMPLANT
KIT ENCORE 26 ADVANTAGE (KITS) ×1 IMPLANT
KIT HEART LEFT (KITS) ×2 IMPLANT
PACK CARDIAC CATHETERIZATION (CUSTOM PROCEDURE TRAY) ×2 IMPLANT
SHEATH PINNACLE 5F 10CM (SHEATH) ×1 IMPLANT
SHEATH PINNACLE 6F 10CM (SHEATH) ×1 IMPLANT
STENT RESOLUTE ONYX 3.0X18 (Permanent Stent) ×1 IMPLANT
TRANSDUCER W/STOPCOCK (MISCELLANEOUS) ×2 IMPLANT
TUBING CIL FLEX 10 FLL-RA (TUBING) ×2 IMPLANT
WIRE COUGAR XT STRL 190CM (WIRE) ×1 IMPLANT
WIRE EMERALD 3MM-J .035X150CM (WIRE) ×1 IMPLANT

## 2019-04-22 NOTE — Progress Notes (Signed)
Upon getting pt to bedside cammode pt had what she described as a "shaking attack" pt was shaking and weak. Pt continue to have a tremor in hands.  MD aware  Will continue to monitor

## 2019-04-22 NOTE — Progress Notes (Signed)
ANTICOAGULATION CONSULT NOTE  Pharmacy Consult for Heparin Indication: atrial fibrillation  No Known Allergies  Patient Measurements: Height: 5\' 6"  (167.6 cm) Weight: 139 lb 4.9 oz (63.2 kg) IBW/kg (Calculated) : 59.3 Heparin Dosing Weight: 60kg  Vital Signs: Temp: 98.1 F (36.7 C) (02/01 0022) Temp Source: Oral (02/01 0022) BP: 111/71 (02/01 0022) Pulse Rate: 83 (02/01 0022)  Labs: Recent Labs     0000 04/19/19 0515 04/19/19 0702 04/19/19 2302 04/20/19 0509 04/20/19 0509 04/20/19 1428 04/20/19 1540 04/21/19 0224 04/22/19 0001  HGB   < >  --  8.2*  --  7.3*  --   --   --  9.6*  --   HCT  --   --  27.1*  --  24.4*  --   --   --  30.2*  --   PLT  --   --  158  --  135*  --   --   --  165  --   HEPARINUNFRC  --   --   --    < > 0.22*   < > 1.46* 0.17*  --  0.55  CREATININE  --  0.73  --   --  0.63  --   --   --  0.88  --    < > = values in this interval not displayed.    Estimated Creatinine Clearance: 55.7 mL/min (by C-G formula based on SCr of 0.88 mg/dL).  Assessment: 69 y.o. female with Afib for heparin  Goal of Therapy:  Heparin level 0.3-0.7 units/ml Monitor platelets by anticoagulation protocol: Yes   Plan:  Continue Heparin at current rate  64, PharmD, BCPS

## 2019-04-22 NOTE — Progress Notes (Signed)
Site area: right groin  Site Prior to Removal:  Level 0  Pressure Applied For 20 MINUTES    Minutes Beginning at 1310  Manual:   Yes.    Patient Status During Pull:  Stable   Post Pull Groin Site:  Level 0  Post Pull Instructions Given:  Yes.    Post Pull Pulses Present:  Yes.    Dressing Applied:  Yes.    Comments:    Bed rest started at 1330 X 4 hr.

## 2019-04-22 NOTE — Plan of Care (Signed)
  Problem: Education: Goal: Knowledge of General Education information will improve Description: Including pain rating scale, medication(s)/side effects and non-pharmacologic comfort measures Outcome: Progressing   Problem: Health Behavior/Discharge Planning: Goal: Ability to manage health-related needs will improve Outcome: Progressing   Problem: Clinical Measurements: Goal: Ability to maintain clinical measurements within normal limits will improve Outcome: Progressing   Problem: Nutrition: Goal: Adequate nutrition will be maintained Outcome: Progressing   Problem: Coping: Goal: Level of anxiety will decrease Outcome: Progressing   Problem: Safety: Goal: Ability to remain free from injury will improve Outcome: Progressing   

## 2019-04-22 NOTE — Progress Notes (Signed)
PROGRESS NOTE    Patricia Wall  JQB:[id number] DOB: 08-Jan-1951 DOA: 04/17/2019 PCP: Bernerd Limbo, MD  Brief Narrative:  HPI per Dr. Ileene Musa on 04/17/2019 Patricia Wall is a 69 y.o. female with medical history significant of CAD withmultiple stenting, PVD andprogressiveCOPD on home supplemental O2, 4Lwith ongoing tobacco use who presented with weakness.  Pt unable to provide history due to confusion and no family at bedside. I was unable to reach daughter by phone after multiple attempts.  History obtained entirely from ED physician report and chart review.   Reportedly patient brought in for worsening fatigue over the past 48 hrs and ran out of home O2. She complaints of left sided chest pain that is reproducible on exam but otherwise says "I don't know" to all other questions.   Per cardiology documentation on 1/25, She was admitted to [facility] earlier this month and discharged on 1/15 and was there about 10 days. Found to have CHF andPAF.Placed on metoprolol and Cardizem. Also found to be anemic and was transfused 2 units of pRBC, reportedly heme negative but did not have any further work up. Decision to start anticoagulation was deferred to cardiology and ultimately she was discharged only on Plavix and aspirin.  She had follow up with PCP on 1/21 and Lasix was increased from 59m daily to BID due to increase peripheral edema.   It appears on 1/26 daughter had discuss with cardiology to hold aspirin and Plavix and wanted to discuss GI referral and possible hospice service with PCP.   ED Course: She was afebrile, had borderline BP of 101/47 and was tachypneic and hypercarbic requiring Bipap.  She had no leukocytosis, hemoglobin stable at 9 with chronic anemia, thrombocytopenia 149.  No significant findings on BMP. BNP of 187, troponin flat at 17.  Chest shows new mild hazy left basilar atelectasis and or infiltrate.  She was given 471mLasix, Zosyn and vancomycin in the  ED.   **Interim History Patient was on the BiPAP initially and was complaining of shortness of breath but now improved.  Started start diuresis and continue steroids and breathing medications have been adjusted.  Remained a little bit hypercarbic.  Will obtain SLP evaluation to ensure that she can safely swallow given her AMS and obtained a TSH, RPR, B12 level.   Her AMS appears to be improved and SLP cleared her and she is off of the BiPAP and on 4 L of supplemental oxygen via nasal which is her home dose.  Because she went into A. fib with RVR  A few days ago and continues to be in heart failure have called Cardiology for further evaluation recommendations. Cardiology diuresing and requesting GI to do a scope prior to Cath. Hb/Hct dropped slightly so she was transfused 1 unit of pRBC's and Heme/Onc Evaluated.   She underwent an EGD today showed a normal esophagus, normal stomach, normal duodenal bulb and the first portion of the duodenum and second portion of the duodenum and no source of anemia was found on endoscopy and no old or fresh blood was identified.  Patient complaining of some chest pain which she is already on a heparin drip.  She underwent cardiac catheterization today and ended up getting a stent in the distal proximal to mid LAD.  Assessment & Plan:   Active Problems:   CAD S/P percutaneous coronary angioplasty: Prior Cypher DES to pLAD, pOM2 & AVG Cx (after Om2); New DES to AVG Cx ISR & Angiosculpt PTCA of Ostial AVG  Cx from OM2.   Acute respiratory failure with hypoxia (HCC)   Anemia   Thrombocytopenia (HCC)   AF (paroxysmal atrial fibrillation) (HCC)   Acute on chronic respiratory failure with hypercapnia (HCC)   SOB (shortness of breath)   Acute diastolic CHF (congestive heart failure) (HCC)   Exertional angina (HCC)   Atrial fibrillation with RVR (HCC)  Acute on Chronic Respiratory Failure with Hypoxia and Hypercarbia requiring noninvasive positive pressure ventilation  with BiPAP; now off of BiPAP and onto 4 L of supplemental nasal cannula -Secondary to HCAP vs COPD exacerbation vs Acute Diastolic CHF exacerbation and likely a combination of all; unlikely HCAP so we will stop antibiotics currently -Baseline at 4L  and she is currently on this right now  -ABG done and showed a pH of 7.433, PCO2 of 67.1, and PO2 of 131, bicarbonate level of 44.0, and an ABG O2 saturation of 99.4% -No leukocytosis or fever. Admit as PUI for COVID but this was negative so transferred to medical floor -BNP on admission was 187.1 and repeat was 212.2 -Started on vancomycin and Zosyn in ED- switched vancomycin and switch to Cefepime but will stop Abx Now  -Repeat CXR 04/22/2019 showed "No acute cardiopulmonary findings. Stable emphysematous changes." -Obtained PCT was <0.10 -Duonebs q6hr changed to Xopenex/Atrovent for Improvement and because of her history of proximal atrial fibrillation -Steroids now change to po Prednisone  -Unfortunately do not have records of her latest echo results from recent admission so will repeat but not able to be done given her fast heart rates today -Appears somewhat volume overloaded -Will continue IV Lasix but Cardiology increased 53m BID they are recommending continuing today and change to p.o. tomorrow; Diuretic Management per Cardiology  -Continue to monitor Is and Os. Daily weights.  Cardiology recommending continuing diuresis  and planning Cath on Monday  -Repeat chest x-ray in a.m. -Repeat echocardiogram as below showed a EF of 60 to 65% with normal LV function mildly increased LVH along with impaired diastolic parameters consistent with grade 1 diastolic dysfunction -Continue supplemental oxygen and wean off of BiPAP to nasal cannula to her home 4 L and now is on 4 Liters -Continuous pulse oximetry and maintain O2 saturations greater than 90 -We will need ambulatory home O2 screen prior to discharge -Cardiac catheterization done today and showed  the patient has some stenosis and had stent placed.  Acute Encephalopathy likely in the setting of hypercarbia rule out other etiologies, improved -Remains somewhat confused but does have donepezil on her MAR and question if she has a history of dementia; per neurology records she does have some memory impairment which has been progressively gotten worse -Check TSH 0.834; RPR was nonreactive and B12 level was 384 -Correct hypercarbia and metabolic issues if still not improving may consider further imaging with EEG and head CT and possible MRI will hold off for now since she did improve some -Obtain SLP evaluation and she is at a mild aspiration risk and they recommend regular diet with thin liquids -Treat infectious causes with pneumonia and continue antibiotics for now -If not improving and will obtain further imaging  Left LE Asymmetry -Left lower extremity appears circumferentially larger than right.   -Will obtain left DVT venous Doppler ultrasound and did not show DVT  CAD s/p multiple stents and now S/P stent 04/22/19 Chest Pain; intermittent  -Decision made by family with cardiology on 1/26 to hold aspirin and plavix due to recent acute on chronic anemia but now she is going  to be placed on Plavix and aspirin per cardiology given her recent stent placement next-we will defer anticoagulation recommendations to cardiology as she also has proximal atrial fibrillation -Initially holding her metoprolol and losartan for her borderline blood pressure and n.p.o. status given her confusion but both have improved  -Resumed her atorvastatin 80 mg p.o. daily now since she is safe enough to swallow along with her losartan 25 mg daily and metoprolol succinate 25 mg p.o. daily -Admitted intermittent chest pain on my examination and states she is not SOB but has some chest tightness which is midsternal that comes and goes -Obtain EKG and we will add Nitropaste 1 inch every 6 as needed -Cardiology is  consulted for further evaluation recommendations and they are planning a cardiac catheterization; patient's Aspirin and Plavix is currently on hold due to concern for GI bleeding but cardiology has resumed this dual antiplatelet therapy ureteral stent was placed today; Dr. Claiborne Billings recommends that aspirin can be discontinued 4 weeks after therapy if she will be on anticoagulation but will defer this to cardiology -EGD done and showed no evidence of any GI bleeding -Currently on Heparin gtt for now and will defer to cardiology to change to NOAC versus other given her proximal atrial fibrillation with RVR  Acute COPD exacerbation -As above -Patient takes albuterol inhalers as well as Symbicort and albuterol nebulizers in the outpatient setting as well as to Spiriva -Continue with steroids, nebs, supplemental oxygen, as well as antibiotics as above -Currently is on Ipratropium/Xopenex 3 times daily  -IV Solu-Medrol been changed to p.o. prednisone -C/w along with 4 L supplemental oxygen via nasal cannula  -Stopped antibiotic coverage with cefepime and vancomycin as do not clinically feel that she has a pneumonia  Acute on Chronic Diastolic CHF -As above -ECHOCardiogram repeated as below  -She takes furosemide 40 mg p.o. twice daily at home -Also takes losartan 25 mg p.o. daily, metoprolol tartrate 25 mg p.o. daily this has been resumed -Cardiology increased her diuresis to IV 40 mg BID and will defer to them for further diuretic management as they were planning on changing to p.o. -Strict I's and O's and daily weights -Continue to monitor for signs and symptoms of volume overload and repeat echocardiogram currently -Repeat chest x-ray showed increasing pulmonary edema -Question of A. fib worsened her heart failure -Patient is - 3.757 L since admission and weight is not done -Cardiology consulted for further evaluation recommendations  GERD -Takes omeprazole 40 mg p.o. daily before breakfast and  will resume with Hospital Substitution   History of Breast Cancer -She is status post chemotherapy and radiation -Currently on letrozole and this has been resumed  Normocytic Anemia  -Appears chronic in nature -Patient hemoglobin/hematocrit on admission was 10.2/30.0 and then dropped to 8.4/29.5 -> 8.2/27.1 -> 7.3/24.4 -> 9.6/30.2 after 1 unit of PRBCs and repeat today was 9.9/31.6 -Checked anemia panel and showed an iron level of 55, U IBC of 267, TIBC of 322, saturation ratios of 17%, ferritin level 288, folate level 29.2, vitamin B12 384 -Continue to monitor for signs and symptoms of bleeding -She is being started on anticoagulation with heparin drip for her atrial fibrillation with RVR; currently no overt bleeding noted and she is also to be started on aspirin and Plavix again due to her recent stent placement; Cardiology to manage anticoagulants and antiplatelets -Repeat CBC in a.m.  -A decision was made with the patient and the patient's cardiologist to hold her aspirin and Plavix due to recent acute  on chronic anemia -Gastroenterology consulted for further evaluation they recommended continuing PPI and took the patient for endoscopy today which was normal; GI to reach out to cardiology to see if cardiology requires any further GI testing prior to her catheterization -Do not clinically feel that she has hemolysis given normal bilirubin and I do not feel that this is an issue of bone marrow production currently given normal reticulocyte count -Because her hemoglobin dropped slightly yesterday hematology oncology was consulted for further evaluation and have ordered SPEP and IFE's with light chain levels as well as reviewed the peripheral smear which showed no significant schistocytes  -Hematology recommended keeping hemoglobin above 8  -If hemoglobin/hematocrit remains persistently low in the outpatient setting then oncology will consider a bone marrow biopsy to rule out MDS -continue  monitor and repeat CBC in a.m.  Thrombocytopenia  -Unclear etiology but could be infectious in the setting of her pneumonia.  -Platelet count is now improved and is 189,000 -No signs of active bleed. Continue to monitor for signs and symptoms of bleeding -Repeat CBC in the a.m.  Hyponatremia -Mild secondary to diuresis and was improved to 135 -Continue to monitor and trend and repeat CMP in a.m.  Hypokalemia -Patient's potassium this morning was 3.7 -Yesterday was low in the setting of diuresis -Continue to monitor and replete as necessary -Repeat CMP in a.m.  Hypophosphatemia -Patient's phosphorus level this morning was 2.0 -Replete with p.o. K-Phos Neutral 500 mg p.o. twice daily x2 doses -Continue to monitor and replete as necessary -Repeat phosphorus level in the a.m.  Paroxysmal Atrial Fibrillation -Not on anticoagulation previously but now has been started with a heparin drip.    Went into RVR during his hospitalization with rates in the 150s and and spontaneously converted to normal sinus rhythm and sinus tachycardia -Currently held metoprolol, diltiazem for now until she can safely swallow but now that she is passed she can resume and this is been restarted -Continue with Telemetry monitoring -Cardiology consulted for further evaluation recommendations -Cardiology started the patient on IV heparin drip given her elevated CHA2DS2-VASc of 5 and will defer to them to continue anticoagulation given that she is now received a stent -Cardiology recommending GI evaluation to evaluate for her anemia -Further care per Cardiology   Hyperbilirubinemia, improved -T bili was 2.0 and improved to 0.6 -Continue monitor and trend and repeat CMP in a.m.  Hyperglycemia in the setting of Pre-Diabetes -In the setting of steroid demargination  -Checked Hemoglobin A1c and was 6.2  -Patient's blood sugars had been ranging from 243-279 and blood sugar this morning on CMP was 209 -Will add  Moderate NovoLog/scale insulin before meals and at bedtime -Now CBGs are ranging from 103-1 06  DVT prophylaxis: SCDs and now on a heparin drip Code Status: FULL CODE  Family Communication: No family present at bedside Disposition Plan: Remain Inpatient for continued workup and improvement of Respiratory Status and Euvolemic State. Will need PT/OT to evaluate and treat and cardiac clearance prior to discharge given now that she has a new stent  Consultants:   Cardiology  Gastroenterology  Hematology oncology   Procedures:  ECHOCARDIOGRAM  IMPRESSIONS    1. Left ventricular ejection fraction, by visual estimation, is 60 to  65%. The left ventricle has normal function. There is mildly increased  left ventricular hypertrophy.  2. Left ventricular diastolic parameters are consistent with Grade I  diastolic dysfunction (impaired relaxation).  3. The left ventricle has no regional wall motion abnormalities.  4. Global  right ventricle has normal systolic function.The right  ventricular size is normal. No increase in right ventricular wall  thickness.  5. Left atrial size was normal.  6. Right atrial size was normal.  7. The mitral valve is normal in structure. No evidence of mitral valve  regurgitation.  8. The tricuspid valve is grossly normal.  9. The tricuspid valve is grossly normal. Tricuspid valve regurgitation  is trivial.  10. The aortic valve is normal in structure. Aortic valve regurgitation is  not visualized.  11. The pulmonic valve was normal in structure. Pulmonic valve  regurgitation is not visualized.  12. Moderately elevated pulmonary artery systolic pressure.  13. Pulmonary hypertension is moderate.  14. The atrial septum is grossly normal.   FINDINGS  Left Ventricle: Left ventricular ejection fraction, by visual estimation,  is 60 to 65%. The left ventricle has normal function. The left ventricle  has no regional wall motion abnormalities. There  is mildly increased left  ventricular hypertrophy. Left  ventricular diastolic parameters are consistent with Grade I diastolic  dysfunction (impaired relaxation).   Right Ventricle: The right ventricular size is normal. No increase in  right ventricular wall thickness. Global RV systolic function is has  normal systolic function. The tricuspid regurgitant velocity is 2.76 m/s,  and with an assumed right atrial pressure  of 15 mmHg, the estimated right ventricular systolic pressure is  moderately elevated at 45.5 mmHg.   Left Atrium: Left atrial size was normal in size.   Right Atrium: Right atrial size was normal in size   Pericardium: There is no evidence of pericardial effusion.   Mitral Valve: The mitral valve is normal in structure. No evidence of  mitral valve regurgitation.   Tricuspid Valve: The tricuspid valve is grossly normal. Tricuspid valve  regurgitation is trivial.   Aortic Valve: The aortic valve is normal in structure. Aortic valve  regurgitation is not visualized.   Pulmonic Valve: The pulmonic valve was normal in structure. Pulmonic valve  regurgitation is not visualized. Pulmonic regurgitation is not visualized.   Aorta: The aortic root and ascending aorta are structurally normal, with  no evidence of dilitation.   Pulmonary Artery: Pulmonary hypertension is moderate.   IAS/Shunts: The atrial septum is grossly normal.     LEFT VENTRICLE  PLAX 2D  LVIDd:     4.50 cm    Diastology  LVIDs:     3.10 cm    LV e' lateral:  5.87 cm/s  LV PW:     1.40 cm    LV E/e' lateral: 6.7  LV IVS:    1.20 cm    LV e' medial:  5.44 cm/s  LVOT diam:   1.90 cm    LV E/e' medial: 7.2  LV SV:     55 ml  LV SV Index:  31.84  LVOT Area:   2.84 cm    LV Volumes (MOD)  LV area d, A2C:  18.10 cm  LV area d, A4C:  20.00 cm  LV area s, A2C:  10.50 cm  LV area s, A4C:  10.80 cm  LV major d, A2C:  6.61 cm  LV major  d, A4C:  6.16 cm  LV major s, A2C:  5.20 cm  LV major s, A4C:  5.33 cm  LV vol d, MOD A2C: 41.0 ml  LV vol d, MOD A4C: 52.7 ml  LV vol s, MOD A2C: 17.2 ml  LV vol s, MOD A4C: 18.0 ml  LV SV MOD  A2C:   23.8 ml  LV SV MOD A4C:   52.7 ml  LV SV MOD BP:   30.4 ml   RIGHT VENTRICLE       IVC  RV S prime:   12.10 cm/s IVC diam: 2.10 cm  TAPSE (M-mode): 1.8 cm   LEFT ATRIUM       Index  LA diam:    2.80 cm 1.64 cm/m  LA Vol (A2C):  28.1 ml 16.43 ml/m  LA Vol (A4C):  20.6 ml 12.04 ml/m  LA Biplane Vol: 23.8 ml 13.91 ml/m  AORTIC VALVE  LVOT Vmax:  90.80 cm/s  LVOT Vmean: 63.900 cm/s  LVOT VTI:  0.153 m    AORTA  Ao Root diam: 3.10 cm   MITRAL VALVE            TRICUSPID VALVE  MV Area (PHT): 5.27 cm       TR Peak grad:  30.5 mmHg  MV PHT:    41.76 msec      TR Vmax:    291.00 cm/s  MV Decel Time: 144 msec  MV E velocity: 39.34 cm/s 103 cm/s SHUNTS  MV A velocity: 76.60 cm/s 70.3 cm/s Systemic VTI: 0.15 m  MV E/A ratio: 0.51    1.5    Systemic Diam: 1.90 cm   EGD 04/21/2019 Findings:      The examined esophagus was normal.      The entire examined stomach was normal.      The duodenal bulb, first portion of the duodenum and second portion of       the duodenum were normal. Impression:               - Normal esophagus.                           - Normal stomach.                           - Normal duodenal bulb, first portion of the                            duodenum and second portion of the duodenum.                           - No source of anemia seen on today's endoscopy. No                            old or fresh blood was identified on today's                            endoscopy.  CARDIAC CATHETERIZATION  2nd Mrg lesion is 20% stenosed.  Previously placed Mid Cx to Dist Cx stent (unknown type) is widely patent.  Prox LAD to Mid LAD lesion is 90% stenosed.  Post intervention, there  is a 0% residual stenosis.  A stent was successfully placed.   There is 90% eccentric in-stent restenosis in the distal proximal LAD Cypher stent which was placed in 2005 in a large LAD system.  Patent stents in a dominant left circumflex vessel with 20% intimal hyperplasia in the stent involving the OM1 vessel   and patent stent in the AV groove circumflex.  Nondominant  normal RCA.  LVEDP 2 mm Hg.  Successful percutaneous coronary intervention to the previously placed 3.0 x 23 mm Cypher stent in the proximal LAD with 90% in-stent restenosis in the distal aspect of the stent with mild narrowing just beyond the stent treated with multiple cuts with a Wolverine cutting balloon 2.5 x 10 mm, and ultimate stenting with a 3.0 x 18 mm Resolute Onyx stent postdilated to 3.25 mm with the stenosis being reduced to 0%.  RECOMMENDATION: DAPT therapy initially with aspirin/Plavix.  If anticoagulation is to be started per primary team due to recent PAF, aspirin can be discontinued after 4 weeks of therapy.  Antimicrobials:  Anti-infectives (From admission, onward)   Start     Dose/Rate Route Frequency Ordered Stop   04/18/19 1000  vancomycin (VANCOREADY) IVPB 750 mg/150 mL  Status:  Discontinued     750 mg 150 mL/hr over 60 Minutes Intravenous Every 12 hours 04/18/19 0140 04/20/19 1925   04/18/19 0600  ceFEPIme (MAXIPIME) 2 g in sodium chloride 0.9 % 100 mL IVPB  Status:  Discontinued     2 g 200 mL/hr over 30 Minutes Intravenous Every 8 hours 04/18/19 0140 04/20/19 1925   04/17/19 2045  vancomycin (VANCOREADY) IVPB 1250 mg/250 mL     1,250 mg 166.7 mL/hr over 90 Minutes Intravenous  Once 04/17/19 2040 04/18/19 0008   04/17/19 2045  piperacillin-tazobactam (ZOSYN) IVPB 3.375 g     3.375 g 100 mL/hr over 30 Minutes Intravenous  Once 04/17/19 2040 04/17/19 2154     Subjective: Seen and examined bedside in the cath recovery and she states that she is doing well.  Denies chest pain,  lightheadedness or dizziness.  No nausea or vomiting.  States that she felt okay.  No other concerns or complaints at this time.  Objective: Vitals:   04/22/19 1017 04/22/19 1022 04/22/19 1027 04/22/19 1057  BP: (!) 142/68 125/64 136/68 (!) 152/61  Pulse: 93 93 89 84  Resp: 11 13 20 20   Temp:      TempSrc:      SpO2: 90% 99% 98%   Weight:      Height:        Intake/Output Summary (Last 24 hours) at 04/22/2019 1229 Last data filed at 04/22/2019 0805 Gross per 24 hour  Intake 1150.31 ml  Output 1800 ml  Net -649.69 ml   Filed Weights   04/20/19 0554 04/21/19 0400 04/22/19 0317  Weight: 63.2 kg 63.2 kg 62.5 kg   Examination: Physical Exam:  Constitutional: WN/WD Caucasian female currently no acute distress appears calm laying flat after her catheterization. Eyes: Lids and conjunctivae normal, sclerae anicteric  ENMT: External Ears, Nose appear normal. Grossly normal hearing. Neck: Appears normal, supple, no cervical masses, normal ROM, no appreciable thyromegaly; no JVD Respiratory: Diminished to auscultation bilaterally with slight coarse breath sounds, no wheezing, rales, rhonchi or crackles. Normal respiratory effort and patient is not tachypenic. No accessory muscle use. She has unlabored breathing and no accessory muscle usage and she is wearing 4 L supplemental oxygen via nasal cannula Cardiovascular: RRR, no murmurs / rubs / gallops. S1 and S2 auscultated.  Trace to 1+ extremity edema. Abdomen: Soft, non-tender, non-distended. Bowel sounds positive.  GU: Deferred. Musculoskeletal: No clubbing / cyanosis of digits/nails. No joint deformity upper and lower extremities. Skin: No rashes, lesions, ulcers on limited skin evaluation. No induration; Warm and dry.  Neurologic: CN 2-12 grossly intact with no focal deficits. Romberg sign and cerebellar reflexes not assessed.  Psychiatric:  Normal judgment and insight. Alert and oriented x 3. Normal mood and appropriate affect.   Data  Reviewed: I have personally reviewed following labs and imaging studies  CBC: Recent Labs  Lab 04/18/19 0608 04/19/19 0702 04/20/19 0509 04/21/19 0224 04/22/19 0248  WBC 4.5 6.8 7.2 9.2 10.1  NEUTROABS  --  5.7 6.3 7.2 7.4  HGB 8.4* 8.2* 7.3* 9.6* 9.9*  HCT 29.5* 27.1* 24.4* 30.2* 31.6*  MCV 97.0 91.2 92.4 87.3 89.3  PLT 124* 158 135* 165 697   Basic Metabolic Panel: Recent Labs  Lab 04/18/19 1826 04/19/19 0515 04/20/19 0509 04/21/19 0224 04/22/19 0248  NA 143 143 132* 133* 135  K 4.2 4.3 3.2* 3.7 3.7  CL 88* 90* 83* 87* 89*  CO2 42* 44* 37* 35* 33*  GLUCOSE 183* 179* 210* 322* 209*  BUN 24* 28* 23 28* 29*  CREATININE 0.77 0.73 0.63 0.88 0.77  CALCIUM 8.8* 8.8* 7.4* 8.3* 8.4*  MG  --  1.6* 1.7 2.3 1.9  PHOS  --  4.0 3.2 2.0* 2.3*   GFR: Estimated Creatinine Clearance: 61.3 mL/min (by C-G formula based on SCr of 0.77 mg/dL). Liver Function Tests: Recent Labs  Lab 04/19/19 0515 04/20/19 0509 04/20/19 1150 04/21/19 0224 04/22/19 0248  AST 18 16 19 16 18   ALT 27 22 24 23 22   ALKPHOS 75 68 71 85 96  BILITOT 0.8 2.0* 0.7 0.9 0.6  PROT 6.0* 5.0* 5.6* 5.7* 5.6*  ALBUMIN 3.0* 2.4* 2.7* 2.7* 2.8*   No results for input(s): LIPASE, AMYLASE in the last 168 hours. No results for input(s): AMMONIA in the last 168 hours. Coagulation Profile: No results for input(s): INR, PROTIME in the last 168 hours. Cardiac Enzymes: No results for input(s): CKTOTAL, CKMB, CKMBINDEX, TROPONINI in the last 168 hours. BNP (last 3 results) Recent Labs    04/15/19 1225  PROBNP 711*   HbA1C: Recent Labs    04/21/19 0911  HGBA1C 6.2*   CBG: Recent Labs  Lab 04/21/19 1413 04/21/19 1753 04/21/19 2126 04/22/19 0602 04/22/19 1122  GLUCAP 243* 340* 288* 103* 106*   Lipid Profile: No results for input(s): CHOL, HDL, LDLCALC, TRIG, CHOLHDL, LDLDIRECT in the last 72 hours. Thyroid Function Tests: No results for input(s): TSH, T4TOTAL, FREET4, T3FREE, THYROIDAB in the last 72  hours. Anemia Panel: Recent Labs    04/20/19 0930  RETICCTPCT 2.0   Sepsis Labs: Recent Labs  Lab 04/18/19 0005  PROCALCITON <0.10    Recent Results (from the past 240 hour(s))  Blood culture (routine x 2)     Status: None   Collection Time: 04/17/19  8:50 PM   Specimen: BLOOD  Result Value Ref Range Status   Specimen Description BLOOD RIGHT ARM  Final   Special Requests   Final    BOTTLES DRAWN AEROBIC AND ANAEROBIC Blood Culture results may not be optimal due to an inadequate volume of blood received in culture bottles   Culture   Final    NO GROWTH 5 DAYS Performed at [facility], [place] [address] [place], [place] [address]    Report Status 04/22/2019 FINAL  Final  Blood culture (routine x 2)     Status: None   Collection Time: 04/17/19  9:07 PM   Specimen: BLOOD  Result Value Ref Range Status   Specimen Description BLOOD RIGHT FOREARM  Final   Special Requests   Final    BOTTLES DRAWN AEROBIC AND ANAEROBIC Blood Culture adequate volume   Culture  Final    NO GROWTH 5 DAYS Performed at [facility], [place] [address], [place], [place] [address]    Report Status 04/22/2019 FINAL  Final  Respiratory Panel by RT PCR (Flu A&B, Covid) - Nasopharyngeal Swab     Status: None   Collection Time: 04/17/19  9:40 PM   Specimen: Nasopharyngeal Swab  Result Value Ref Range Status   SARS Coronavirus 2 by RT PCR NEGATIVE NEGATIVE Final    Comment: (NOTE) SARS-CoV-2 target nucleic acids are NOT DETECTED. The SARS-CoV-2 RNA is generally detectable in upper respiratoy specimens during the acute phase of infection. The lowest concentration of SARS-CoV-2 viral copies this assay can detect is 131 copies/mL. A negative result does not preclude SARS-Cov-2 infection and should not be used as the sole basis for treatment or other patient management decisions. A negative result may occur with  improper specimen collection/handling, submission of specimen other than  nasopharyngeal swab, presence of viral mutation(s) within the areas targeted by this assay, and inadequate number of viral copies (<131 copies/mL). A negative result must be combined with clinical observations, patient history, and epidemiological information. The expected result is Negative. Fact Sheet for Patients:  PinkCheek.be Fact Sheet for Healthcare Providers:  GravelBags.it This test is not yet ap proved or cleared by the [place] FDA and  has been authorized for detection and/or diagnosis of SARS-CoV-2 by FDA under an Emergency Use Authorization (EUA). This EUA will remain  in effect (meaning this test can be used) for the duration of the COVID-19 declaration under Section 564(b)(1) of the Act, 21 U.S.C. section 360bbb-3(b)(1), unless the authorization is terminated or revoked sooner.    Influenza A by PCR NEGATIVE NEGATIVE Final   Influenza B by PCR NEGATIVE NEGATIVE Final    Comment: (NOTE) The Xpert Xpress SARS-CoV-2/FLU/RSV assay is intended as an aid in  the diagnosis of influenza from Nasopharyngeal swab specimens and  should not be used as a sole basis for treatment. Nasal washings and  aspirates are unacceptable for Xpert Xpress SARS-CoV-2/FLU/RSV  testing. Fact Sheet for Patients: PinkCheek.be Fact Sheet for Healthcare Providers: GravelBags.it This test is not yet approved or cleared by the [place] FDA and  has been authorized for detection and/or diagnosis of SARS-CoV-2 by  FDA under an Emergency Use Authorization (EUA). This EUA will remain  in effect (meaning this test can be used) for the duration of the  Covid-19 declaration under Section 564(b)(1) of the Act, 21  U.S.C. section 360bbb-3(b)(1), unless the authorization is  terminated or revoked. Performed at [facility], [place] [address], [place], [place] [address]   SARS  CORONAVIRUS 2 (TAT 6-24 HRS) Nasopharyngeal Nasopharyngeal Swab     Status: None   Collection Time: 04/18/19 12:35 AM   Specimen: Nasopharyngeal Swab  Result Value Ref Range Status   SARS Coronavirus 2 NEGATIVE NEGATIVE Final    Comment: (NOTE) SARS-CoV-2 target nucleic acids are NOT DETECTED. The SARS-CoV-2 RNA is generally detectable in upper and lower respiratory specimens during the acute phase of infection. Negative results do not preclude SARS-CoV-2 infection, do not rule out co-infections with other pathogens, and should not be used as the sole basis for treatment or other patient management decisions. Negative results must be combined with clinical observations, patient history, and epidemiological information. The expected result is Negative. Fact Sheet for Patients: SugarRoll.be Fact Sheet for Healthcare Providers: https://www.woods-mathews.com/ This test is not yet approved or cleared by the [place] FDA and  has  been authorized for detection and/or diagnosis of SARS-CoV-2 by FDA under an Emergency Use Authorization (EUA). This EUA will remain  in effect (meaning this test can be used) for the duration of the COVID-19 declaration under Section 56 4(b)(1) of the Act, 21 U.S.C. section 360bbb-3(b)(1), unless the authorization is terminated or revoked sooner. Performed at [facility], [place] [address], [place], [place] [address]   MRSA PCR Screening     Status: None   Collection Time: 04/20/19  8:21 AM   Specimen: Nasal Swab; Nasopharyngeal  Result Value Ref Range Status   MRSA by PCR NEGATIVE NEGATIVE Final    Comment:        The GeneXpert MRSA Assay (FDA approved for NASAL specimens only), is one component of a comprehensive MRSA colonization surveillance program. It is not intended to diagnose MRSA infection nor to guide or monitor treatment for MRSA infections. Performed at [facility], [place] [address], [place], [place] [address]     Radiology Studies: CARDIAC CATHETERIZATION  Result Date: 04/22/2019  2nd Mrg lesion is 20% stenosed.  Previously placed Mid Cx to Dist Cx stent (unknown type) is widely patent.  Prox LAD to Mid LAD lesion is 90% stenosed.  Post intervention, there is a 0% residual stenosis.  A stent was successfully placed.  There is 90% eccentric in-stent restenosis in the distal proximal LAD Cypher stent which was placed in 2005 in a large LAD system. Patent stents in a dominant left circumflex vessel with 20% intimal hyperplasia in the stent involving the OM1 vessel   and patent stent in the AV groove circumflex. Nondominant normal RCA. LVEDP 2 mm Hg. Successful percutaneous coronary intervention to the previously placed 3.0 x 23 mm Cypher stent in the proximal LAD with 90% in-stent restenosis in the distal aspect of the stent with mild narrowing just beyond the stent treated with multiple cuts with a Wolverine cutting balloon 2.5 x 10 mm, and ultimate stenting with a 3.0 x 18 mm Resolute Onyx stent postdilated to 3.25 mm with the stenosis being reduced to 0%. RECOMMENDATION: DAPT therapy initially with aspirin/Plavix.  If anticoagulation is to be started per primary team due to recent PAF, aspirin can be discontinued after 4 weeks of therapy.   DG CHEST PORT 1 VIEW  Result Date: 04/22/2019 CLINICAL DATA:  Shortness of breath. EXAM: PORTABLE CHEST 1 VIEW COMPARISON:  04/21/2019 FINDINGS: The cardiac silhouette, mediastinal and hilar contours are normal and stable. Stable aortic calcifications. Stable emphysematous changes. Resolving/resolved interstitial edema. No infiltrates or effusions. No pulmonary lesions. The bony thorax is intact. IMPRESSION: No acute cardiopulmonary findings. Stable emphysematous changes. Electronically Signed   By: Marijo Sanes M.D.   On: 04/22/2019 05:48   DG CHEST PORT 1 VIEW  Result Date: 04/21/2019 CLINICAL DATA:  Shortness of breath EXAM: PORTABLE  CHEST 1 VIEW COMPARISON:  Chest radiograph 04/20/2019 FINDINGS: Monitoring leads overlie the patient. Stable cardiac and mediastinal contours. Aortic atherosclerosis. Emphysematous change. Similar bilateral mid and lower lung interstitial opacities. No pleural effusion or pneumothorax. IMPRESSION: Similar bilateral mid and lower lung interstitial opacities which may represent edema. Emphysema. Electronically Signed   By: Lovey Newcomer M.D.   On: 04/21/2019 07:17   Scheduled Meds: . [MAR Hold] atorvastatin  80 mg Oral Daily  . [MAR Hold] diltiazem  180 mg Oral Daily  . [MAR Hold] donepezil  5 mg Oral QHS  . [MAR Hold] DULoxetine  60 mg Oral Daily  . [MAR Hold] furosemide  40 mg Intravenous Q12H  . [MAR Hold] gabapentin  300 mg Oral TID  . [MAR Hold] insulin aspart  0-15 Units Subcutaneous TID WC  . [MAR Hold] insulin aspart  0-5 Units Subcutaneous QHS  . [MAR Hold] ipratropium  0.5 mg Nebulization TID  . [MAR Hold] letrozole  2.5 mg Oral Daily  . [MAR Hold] levalbuterol  0.63 mg Nebulization TID  . [MAR Hold] losartan  25 mg Oral Daily  . [MAR Hold] mometasone-formoterol  2 puff Inhalation BID  . [MAR Hold] pantoprazole  40 mg Oral Daily  . [MAR Hold] phosphorus  500 mg Oral BID  . [MAR Hold] predniSONE  60 mg Oral Q breakfast  . [MAR Hold] sodium chloride flush  3 mL Intravenous Once  . [MAR Hold] sodium chloride flush  3 mL Intravenous Q12H   Continuous Infusions: . sodium chloride    . sodium chloride 150 mL/hr (04/22/19 1016)  . heparin 1,300 Units/hr (04/21/19 1911)  . lactated ringers 20 mL/hr at 04/21/19 1213    LOS: 5 days   Kerney Elbe, DO [facility] PAGER is on AMION  If 7PM-7AM, please contact night-coverage [email or web address]

## 2019-04-22 NOTE — Interval H&P Note (Signed)
Cath Lab Visit (complete for each Cath Lab visit)  Clinical Evaluation Leading to the Procedure:   ACS: No.  Non-ACS:    Anginal Classification: CCS II  Anti-ischemic medical therapy: Minimal Therapy (1 class of medications)  Non-Invasive Test Results: No non-invasive testing performed  Prior CABG: No previous CABG      History and Physical Interval Note:  04/22/2019 8:55 AM  Patricia Wall  has presented today for surgery, with the diagnosis of Chest pain.  The various methods of treatment have been discussed with the patient and family. After consideration of risks, benefits and other options for treatment, the patient has consented to  Procedure(s): LEFT HEART CATH AND CORONARY ANGIOGRAPHY (N/A) as a surgical intervention.  The patient's history has been reviewed, patient examined, no change in status, stable for surgery.  I have reviewed the patient's chart and labs.  Questions were answered to the patient's satisfaction.     Nicki Guadalajara

## 2019-04-22 NOTE — Progress Notes (Signed)
ANTICOAGULATION CONSULT NOTE  Pharmacy Consult for Heparin Indication: atrial fibrillation  No Known Allergies  Patient Measurements: Height: 5\' 6"  (167.6 cm) Weight: 137 lb 12.6 oz (62.5 kg) IBW/kg (Calculated) : 59.3 Heparin Dosing Weight: 60kg  Vital Signs: Temp: 98.4 F (36.9 C) (02/01 0803) Temp Source: Oral (02/01 0803) BP: 146/62 (02/01 1400) Pulse Rate: 87 (02/01 1400)  Labs: Recent Labs    04/20/19 0509 04/20/19 0509 04/20/19 1428 04/20/19 1540 04/21/19 0224 04/22/19 0001 04/22/19 0248  HGB 7.3*   < >  --   --  9.6*  --  9.9*  HCT 24.4*  --   --   --  30.2*  --  31.6*  PLT 135*  --   --   --  165  --  189  HEPARINUNFRC 0.22*  --    < > 0.17*  --  0.55 0.64  CREATININE 0.63  --   --   --  0.88  --  0.77   < > = values in this interval not displayed.    Estimated Creatinine Clearance: 61.3 mL/min (by C-G formula based on SCr of 0.77 mg/dL).  Assessment: 69 y.o. female with Afib for heparin.  Heparin gtt stopped for cath lab today.  Pharmacy asked to resume heparin 8 hrs after sheath out.  Sheath pulled at 1030 AM.    Goal of Therapy:  Heparin level 0.3-0.7 units/ml Monitor platelets by anticoagulation protocol: Yes   Plan:  Restart IV heparin at 1830 PM at previous rate of 1300 units/hr. Check heparin level 8 hrs after gtt restarts. Daily heparin level and CBC. Consider starting Eliquis tomorrow?  66, [facility] Clinical Pharmacist Phone [phone]  04/22/2019 2:45 PM

## 2019-04-22 NOTE — Progress Notes (Signed)
SLP Cancellation Note  Patient Details Name: Patricia Wall MRN: [id number] DOB: 1950-08-05   Cancelled treatment:       Reason Eval/Treat Not Completed: Patient at procedure or test/unavailable  ST attempted treatment session in morning. Patient unavailable, undergoing surgery. ST to re-attempt as able.  Shella Spearing, M.Ed., CCC-SLP Speech Therapy Acute Rehabilitation [phone]: Acute Rehab office [phone] - pager   Shella Spearing 04/22/2019

## 2019-04-23 ENCOUNTER — Inpatient Hospital Stay (HOSPITAL_COMMUNITY): Payer: Medicare Other

## 2019-04-23 DIAGNOSIS — L8990 Pressure ulcer of unspecified site, unspecified stage: Secondary | ICD-10-CM | POA: Insufficient documentation

## 2019-04-23 LAB — MULTIPLE MYELOMA PANEL, SERUM
Albumin SerPl Elph-Mcnc: 2.8 g/dL — ABNORMAL LOW (ref 2.9–4.4)
Albumin/Glob SerPl: 1.2 (ref 0.7–1.7)
Alpha 1: 0.3 g/dL (ref 0.0–0.4)
Alpha2 Glob SerPl Elph-Mcnc: 0.7 g/dL (ref 0.4–1.0)
B-Globulin SerPl Elph-Mcnc: 0.9 g/dL (ref 0.7–1.3)
Gamma Glob SerPl Elph-Mcnc: 0.5 g/dL (ref 0.4–1.8)
Globulin, Total: 2.5 g/dL (ref 2.2–3.9)
IgA: 119 mg/dL (ref 87–352)
IgG (Immunoglobin G), Serum: 605 mg/dL (ref 586–1602)
IgM (Immunoglobulin M), Srm: 93 mg/dL (ref 26–217)
Total Protein ELP: 5.3 g/dL — ABNORMAL LOW (ref 6.0–8.5)

## 2019-04-23 LAB — CBC WITH DIFFERENTIAL/PLATELET
Abs Immature Granulocytes: 0.14 K/uL — ABNORMAL HIGH (ref 0.00–0.07)
Basophils Absolute: 0 K/uL (ref 0.0–0.1)
Basophils Relative: 0 %
Eosinophils Absolute: 0.2 K/uL (ref 0.0–0.5)
Eosinophils Relative: 3 %
HCT: 34.4 % — ABNORMAL LOW (ref 36.0–46.0)
Hemoglobin: 10.9 g/dL — ABNORMAL LOW (ref 12.0–15.0)
Immature Granulocytes: 2 %
Lymphocytes Relative: 26 %
Lymphs Abs: 1.9 K/uL (ref 0.7–4.0)
MCH: 28.2 pg (ref 26.0–34.0)
MCHC: 31.7 g/dL (ref 30.0–36.0)
MCV: 89.1 fL (ref 80.0–100.0)
Monocytes Absolute: 0.6 K/uL (ref 0.1–1.0)
Monocytes Relative: 9 %
Neutro Abs: 4.4 K/uL (ref 1.7–7.7)
Neutrophils Relative %: 60 %
Platelets: 184 K/uL (ref 150–400)
RBC: 3.86 MIL/uL — ABNORMAL LOW (ref 3.87–5.11)
RDW: 19.7 % — ABNORMAL HIGH (ref 11.5–15.5)
WBC: 7.4 K/uL (ref 4.0–10.5)
nRBC: 0 % (ref 0.0–0.2)

## 2019-04-23 LAB — COMPREHENSIVE METABOLIC PANEL
ALT: 30 U/L (ref 0–44)
AST: 25 U/L (ref 15–41)
Albumin: 2.7 g/dL — ABNORMAL LOW (ref 3.5–5.0)
Alkaline Phosphatase: 104 U/L (ref 38–126)
Anion gap: 8 (ref 5–15)
BUN: 20 mg/dL (ref 8–23)
CO2: 36 mmol/L — ABNORMAL HIGH (ref 22–32)
Calcium: 8.3 mg/dL — ABNORMAL LOW (ref 8.9–10.3)
Chloride: 95 mmol/L — ABNORMAL LOW (ref 98–111)
Creatinine, Ser: 0.74 mg/dL (ref 0.44–1.00)
GFR calc Af Amer: >60 mL/min (ref >60)
GFR calc non Af Amer: >60 mL/min (ref >60)
Glucose, Bld: 144 mg/dL — ABNORMAL HIGH (ref 70–99)
Potassium: 3.6 mmol/L (ref 3.5–5.1)
Sodium: 139 mmol/L (ref 135–145)
Total Bilirubin: 0.6 mg/dL (ref 0.3–1.2)
Total Protein: 5.5 g/dL — ABNORMAL LOW (ref 6.5–8.1)

## 2019-04-23 LAB — LIPID PANEL
Cholesterol: 154 mg/dL (ref 0–200)
HDL: 51 mg/dL (ref >40)
LDL Cholesterol: 85 mg/dL (ref 0–99)
Total CHOL/HDL Ratio: 3 RATIO
Triglycerides: 89 mg/dL (ref <150)
VLDL: 18 mg/dL (ref 0–40)

## 2019-04-23 LAB — HEPARIN LEVEL (UNFRACTIONATED)
Heparin Unfractionated: 0.44 IU/mL (ref 0.30–0.70)
Heparin Unfractionated: >2.2 IU/mL — ABNORMAL HIGH (ref 0.30–0.70)

## 2019-04-23 LAB — PHOSPHORUS: Phosphorus: 3.9 mg/dL (ref 2.5–4.6)

## 2019-04-23 LAB — GLUCOSE, CAPILLARY
Glucose-Capillary: 149 mg/dL — ABNORMAL HIGH (ref 70–99)
Glucose-Capillary: 203 mg/dL — ABNORMAL HIGH (ref 70–99)

## 2019-04-23 LAB — MAGNESIUM: Magnesium: 1.8 mg/dL (ref 1.7–2.4)

## 2019-04-23 MED ORDER — PREDNISONE 10 MG (21) PO TBPK: ORAL_TABLET | ORAL | 0 refills | Status: DC

## 2019-04-23 MED ORDER — ASPIRIN 81 MG PO CHEW: 81.0000 mg | CHEWABLE_TABLET | Freq: Every day | ORAL | 0 refills | Status: DC

## 2019-04-23 MED ORDER — ACETAMINOPHEN 325 MG PO TABS: 650.0000 mg | ORAL_TABLET | ORAL | 0 refills | Status: AC | PRN

## 2019-04-23 MED ORDER — FUROSEMIDE 40 MG PO TABS: 40.0000 mg | ORAL_TABLET | Freq: Two times a day (BID) | ORAL | Status: DC

## 2019-04-23 MED ORDER — APIXABAN 5 MG PO TABS: 5.0000 mg | ORAL_TABLET | Freq: Two times a day (BID) | ORAL | 0 refills | Status: DC

## 2019-04-23 MED ORDER — ANGIOPLASTY BOOK
Freq: Once | Status: AC
  Filled 2019-04-23: qty 1

## 2019-04-23 MED ORDER — CLOPIDOGREL BISULFATE 75 MG PO TABS: 75.0000 mg | ORAL_TABLET | Freq: Every day | ORAL | 0 refills | Status: DC

## 2019-04-23 MED ORDER — APIXABAN 5 MG PO TABS
5.0000 mg | ORAL_TABLET | Freq: Two times a day (BID) | ORAL | Status: DC
  Administered 2019-04-23: 13:00:00 5 mg via ORAL
  Filled 2019-04-23: qty 1

## 2019-04-23 NOTE — Progress Notes (Signed)
ANTICOAGULATION CONSULT NOTE  Pharmacy Consult for Heparin Indication: atrial fibrillation  No Known Allergies  Patient Measurements: Height: 5\' 6"  (167.6 cm) Weight: 137 lb 12.6 oz (62.5 kg) IBW/kg (Calculated) : 59.3 Heparin Dosing Weight: 60kg  Vital Signs: Temp: 98.5 F (36.9 C) (02/01 2154) Temp Source: Oral (02/01 2154) BP: 125/76 (02/01 1745) Pulse Rate: 87 (02/01 1745)  Labs: Recent Labs    04/20/19 1540 04/21/19 0224 04/21/19 0224 04/22/19 0001 04/22/19 0248 04/23/19 0209 04/23/19 0218  HGB  --  9.6*   < >  --  9.9*  --  10.9*  HCT  --  30.2*  --   --  31.6*  --  34.4*  PLT  --  165  --   --  189  --  184  HEPARINUNFRC   < >  --   --  0.55 0.64 0.44  --   CREATININE  --  0.88  --   --  0.77  --  0.74   < > = values in this interval not displayed.    Estimated Creatinine Clearance: 61.3 mL/min (by C-G formula based on SCr of 0.74 mg/dL).  Assessment: 69 y.o. female with Afib for heparin Goal of Therapy:  Heparin level 0.3-0.7 units/ml Monitor platelets by anticoagulation protocol: Yes   Plan:  .Continue Heparin at current rate  66, PharmD, BCPS   04/23/2019 2:57 AM

## 2019-04-23 NOTE — Progress Notes (Addendum)
Progress Note  Patient Name: Patricia Wall Date of Encounter: 04/23/2019  Primary Cardiologist: Charlton Haws, MD   Subjective   Feels well, no CP, breathing at baseline Wants d/c  Inpatient Medications    Scheduled Meds: . aspirin  81 mg Oral Daily  . atorvastatin  80 mg Oral Daily  . clopidogrel  75 mg Oral Q breakfast  . diltiazem  180 mg Oral Daily  . donepezil  5 mg Oral QHS  . DULoxetine  60 mg Oral Daily  . furosemide  40 mg Intravenous Q12H  . gabapentin  300 mg Oral TID  . insulin aspart  0-15 Units Subcutaneous TID WC  . insulin aspart  0-5 Units Subcutaneous QHS  . ipratropium  0.5 mg Nebulization TID  . letrozole  2.5 mg Oral Daily  . levalbuterol  0.63 mg Nebulization TID  . losartan  25 mg Oral Daily  . mometasone-formoterol  2 puff Inhalation BID  . pantoprazole  40 mg Oral Daily  . phosphorus  500 mg Oral BID  . predniSONE  60 mg Oral Q breakfast  . sodium chloride flush  3 mL Intravenous Once  . sodium chloride flush  3 mL Intravenous Q12H  . sodium chloride flush  3 mL Intravenous Q12H   Continuous Infusions: . sodium chloride 50 mL/hr (04/22/19 2139)  . sodium chloride    . heparin 1,300 Units/hr (04/22/19 1854)  . lactated ringers 20 mL/hr at 04/21/19 1213   PRN Meds: sodium chloride, acetaminophen, diazepam, methocarbamol, nitroGLYCERIN, nitroGLYCERIN, ondansetron (ZOFRAN) IV, sodium chloride flush, traMADol   Vital Signs    Vitals:   04/22/19 1745 04/22/19 1953 04/22/19 2154 04/23/19 0515  BP: 125/76   124/75  Pulse: 87   95  Resp:    19  Temp:   98.5 F (36.9 C)   TempSrc:   Oral   SpO2: 98% 96%  98%  Weight:    61.5 kg  Height:        Intake/Output Summary (Last 24 hours) at 04/23/2019 0737 Last data filed at 04/23/2019 0116 Gross per 24 hour  Intake 480 ml  Output 3250 ml  Net -2770 ml   NEt neg 2.3 L   Last 3 Weights 04/23/2019 04/22/2019 04/21/2019  Weight (lbs) 135 lb 8 oz 137 lb 12.6 oz 139 lb 4.9 oz  Weight (kg) 61.462  kg 62.5 kg 63.19 kg      Telemetry    SR   - Personally Reviewed  ECG    02/02 ECG is SR, HR 91, PR 108 ms (shorter than previous), ?inc RBBB - Personally Reviewed  Physical Exam   General: Well developed, well nourished, female in no acute distress Head: Eyes PERRLA, Head normocephalic and atraumatic Lungs: scattered rales bilaterally to auscultation. Heart: HRRR S1 S2, without rub or gallop. No murmur. 4/4 extremity pulses are 2+ & equal. No JVD. Abdomen: Bowel sounds are present, abdomen soft and non-tender without masses or  hernias noted.  Msk: Normal strength and tone for age. Extremities: No clubbing, cyanosis or edema.   R groin cath site w/out ecchymosis or hematoma Skin:  No rashes or lesions noted. Neuro: Alert and oriented X 3. Psych:  Good affect, responds appropriately  Labs    High Sensitivity Troponin:   Recent Labs  Lab 04/17/19 1620 04/17/19 2059  TROPONINIHS 14 14      Chemistry Recent Labs  Lab 04/21/19 0224 04/22/19 0248 04/23/19 0218  NA 133* 135 139  K 3.7  3.7 3.6  CL 87* 89* 95*  CO2 35* 33* 36*  GLUCOSE 322* 209* 144*  BUN 28* 29* 20  CREATININE 0.88 0.77 0.74  CALCIUM 8.3* 8.4* 8.3*  PROT 5.7* 5.6* 5.5*  ALBUMIN 2.7* 2.8* 2.7*  AST 16 18 25   ALT 23 22 30   ALKPHOS 85 96 104  BILITOT 0.9 0.6 0.6  GFRNONAA >60 >60 >60  GFRAA >60 >60 >60  ANIONGAP 11 13 8      Hematology Recent Labs  Lab 04/21/19 0224 04/22/19 0248 04/23/19 0218  WBC 9.2 10.1 7.4  RBC 3.46* 3.54* 3.86*  HGB 9.6* 9.9* 10.9*  HCT 30.2* 31.6* 34.4*  MCV 87.3 89.3 89.1  MCH 27.7 28.0 28.2  MCHC 31.8 31.3 31.7  RDW 19.4* 19.8* 19.7*  PLT 165 189 184    BNP Recent Labs  Lab 04/17/19 2059 04/19/19 0702  BNP 187.1* 212.2*     DDimer  Recent Labs  Lab 04/19/19 0706  DDIMER 0.56*    Lab Results  Component Value Date   CHOL  08/29/2009    157        ATP III CLASSIFICATION:  <200     mg/dL   Desirable  200-239  mg/dL   Borderline High  >=240     mg/dL   High          HDL 31 (L) 08/29/2009   LDLCALC  08/29/2009    63        Total Cholesterol/HDL:CHD Risk Coronary Heart Disease Risk Table                     Men   Women  1/2 Average Risk   3.4   3.3  Average Risk       5.0   4.4  2 X Average Risk   9.6   7.1  3 X Average Risk  23.4   11.0        Use the calculated Patient Ratio above and the CHD Risk Table to determine the patient's CHD Risk.        ATP III CLASSIFICATION (LDL):  <100     mg/dL   Optimal  100-129  mg/dL   Near or Above                    Optimal  130-159  mg/dL   Borderline  160-189  mg/dL   High  >190     mg/dL   Very High   TRIG 313 (H) 08/29/2009   CHOLHDL 5.1 08/29/2009     Radiology      Cardiac Studies   Echo1/29/21    1. Left ventricular ejection fraction, by visual estimation, is 60 to 65%. The left ventricle has normal function. There is mildly increased left ventricular hypertrophy. 2. Left ventricular diastolic parameters are consistent with Grade I diastolic dysfunction (impaired relaxation). 3. The left ventricle has no regional wall motion abnormalities. 4. Global right ventricle has normal systolic function.The right ventricular size is normal. No increase in right ventricular wall thickness. 5. Left atrial size was normal. 6. Right atrial size was normal. 7. The mitral valve is normal in structure. No evidence of mitral valve regurgitation. 8. The tricuspid valve is grossly normal. 9. The tricuspid valve is grossly normal. Tricuspid valve regurgitation is trivial. 10. The aortic valve is normal in structure. Aortic valve regurgitation is not visualized. 11. The pulmonic valve was normal in structure. Pulmonic valve regurgitation is not visualized. 12.  Moderately elevated pulmonary artery systolic pressure. 13. Pulmonary hypertension is moderate. 14. The atrial septum is grossly normal.  CARDIAC CATH: 04/22/2019  2nd Mrg lesion is 20% stenosed.  Previously placed Mid Cx  to Dist Cx stent (unknown type) is widely patent.  Prox LAD to Mid LAD lesion is 90% stenosed.  Post intervention, there is a 0% residual stenosis.  A stent was successfully placed.   There is 90% eccentric in-stent restenosis in the distal proximal LAD Cypher stent which was placed in 2005 in a large LAD system.  Patent stents in a dominant left circumflex vessel with 20% intimal hyperplasia in the stent involving the OM1 vessel   and patent stent in the AV groove circumflex.  Nondominant normal RCA.  LVEDP 2 mm Hg.  Successful percutaneous coronary intervention to the previously placed 3.0 x 23 mm Cypher stent in the proximal LAD with 90% in-stent restenosis in the distal aspect of the stent with mild narrowing just beyond the stent treated with multiple cuts with a Wolverine cutting balloon 2.5 x 10 mm, and ultimate stenting with a 3.0 x 18 mm Resolute Onyx stent postdilated to 3.25 mm with the stenosis being reduced to 0%.  RECOMMENDATION: DAPT therapy initially with aspirin/Plavix.  If anticoagulation is to be started per primary team due to recent PAF, aspirin can be discontinued after 4 weeks of therapy.   Patient Profile     Patricia Wall is a 69 y.o. female with a hx CAD s/p multiple stenting, PVD, COPD on home O2 4 L, carotid disease, hyperlipidemia, tobacco abuse, and recent admission to [facility] where she was treated for paroxysmal A. Fib, heart failure, and acute anemia who was admitted 01/27 for the evaluation of heart failure, CAD.  Assessment & Plan    1  Acute on chronic diastolic CHF   - recurring, ?2nd PAF RVR - had missed some Lasix doses pta - still on IV Lasix, but feel we may be able to change to po rx today, discuss w/ MD - encourage compliance w/ RX and low Na diet  2 Pulm  Signif COPD  - mgt per IM - no wheeze - home O2 at 4 lpm  3  Atrial fibrillation  - in SR - on Dilt 180 mg, not on BB 2nd lung dz - no anticoag, PCP wanted Cards  to address - CHA2DS2-VASc = 7 (age x 2, female, CAD, HTN, TIA x 2) - need to weigh risks/benefits carefully w/ anemia   4   CAD  Hx CAD  Stent to LAD, LX OM2 in 2005; PTCA instent stenosis in 2006;  Stent to LCx in 2015  ON Admit she did complain of CP that was like pain prior to stent   Trop 14 and 14 (normal)  - she had some CP>>concern for angina - s/p cath 02/01 w/ DES LAD - on DAPT  5.  Anemia - No source of bleeding found on EGD - no hx GIB - Dr Dulce Sellar felt this is most likely acute on chronic anemia - Dr Mosetta Putt saw, mult myeloma panel in process, do not see haptoglobin, other labs ok - no nutritional cause for anemia - per IM/GI/Hematology - mild trend up  6   HLD   - on Lipitor - ck profile   For questions or updates, please contact [facility] Please consult [email or web address] for contact info under        Signed, Theodore Demark, PA-C  04/23/2019, 7:37 AM  Attending Note:   The patient was seen and examined.  Agree with assessment and plan as noted above.  Changes made to the above note as needed.  Patient seen and independently examined with  Theodore Demark, PA 1.  .   We discussed all aspects of the encounter. I agree with the assessment and plan as stated above.  1. CAD s/p PCI.  Doing well.  Has ambulated without problems Will need ASA and plavix and eliquis at DC.  Will DC ASA after 1 month and continue with Eliquis and plavix for a year.   Eliquis alone thereafter ( assuming no bleeding issues)   2.  Afib:  Will need eliquis with chads2vasc score of 7.      3.  Chronic diastolic chf:   Change IV lasix to po  Restart home dose of lasix 40 mg po bid  4.  HLD :  Cont atrovastatin   I have spent a total of 40 minutes with patient reviewing hospital  notes , telemetry, EKGs, labs and examining patient as well as establishing an assessment and plan that was discussed with the patient. > 50% of time was spent in direct patient care.    Vesta Mixer, Montez Hageman.,  MD, [facility] 04/23/2019, 12:40 PM [address]  [address] Office [phone] Pager [phone]

## 2019-04-23 NOTE — Discharge Summary (Signed)
Physician Discharge Summary  Patricia Wall WUJ:[id number] DOB: 1950/07/15 DOA: 04/17/2019  PCP: Patricia Limbo, MD  Admit date: 04/17/2019 Discharge date: 04/23/2019  Admitted From:  Home Disposition: Home  Recommendations for Outpatient Follow-up:  1. Follow up with PCP in 1-2 weeks 2. Follow up with Cardiology within 1-2 weeks 3. Follow up with Heme/Onc in 1-2 weeks For Chronic Anemia 4. Follow up with Gastroenterology in the outpatient setting  5. Please obtain CMP/CBC, Mag, Phos in one week 6. Please follow up on the following pending results:  Home Health: No Equipment/Devices: None   Discharge Condition: Stable CODE STATUS: FULL CODE Diet recommendation: Heart Healthy Diet   Brief/Interim Summary: HPI per Dr. Ileene Wall on 04/17/2019 Patricia Hutching Buchananis a 69 y.o.femalewith medical history significant ofCAD withmultiple stenting, PVD andprogressiveCOPD on home supplemental O2, 4Lwith ongoingtobaccouse who presented with weakness.  Pt unable to provide history due to confusion and no family at bedside. I was unable to reach daughter by phone after multiple attempts.  History obtained entirely from ED physician report and chart review.   Reportedly patient brought in for worsening fatigue over the past 48 hrs and ran out of home O2. She complaints of left sided chest pain that is reproducible on exam but otherwise says "I don't know" to all other questions.   Per cardiology documentation on 1/25, She was [facility]and discharged on 1/15 and was there about 10 days.Found to have CHF andPAF.Placed on metoprolol andCardizem. Also found to be anemic and was transfused 2 units of pRBC,reportedly heme negative butdid not haveany furtherwork up. Decision to start anticoagulation was deferred to cardiology and ultimately she was discharged only on Plavix and aspirin. She had follow up with PCP on 1/21 and Lasix was increased from 25m daily to  BID due to increase peripheral edema.   It appears on 1/26 daughter had discuss with cardiology to hold aspirin and Plavix and wanted to discuss GI referral and possible hospice service with PCP.  ED Course:She was afebrile, had borderline BP of 101/47 and was tachypneic and hypercarbic requiring Bipap. She had no leukocytosis, hemoglobin stable at 9 with chronic anemia, thrombocytopenia 149.No significant findings on BMP. BNP of 187, troponin flat at 17.  Chest shows new mild hazy left basilar atelectasis and or infiltrate.  She was given 432mLasix, Zosyn and vancomycin in the ED.  **Interim History Patient was on the BiPAP initially and was complaining of shortness of breath but now improved.  Started start diuresis and continue steroids and breathing medications have been adjusted.  Remained a little bit hypercarbic.  Will obtain SLP evaluation to ensure that she can safely swallow given her AMS and obtained a TSH, RPR, B12 level.   Her AMS appears to be improved and SLP cleared her and she is off of the BiPAP and on 4 L of supplemental oxygen via nasal which is her home dose.  Because she went into A. fib with RVR  A few days ago and continues to be in heart failure have called Cardiology for further evaluation recommendations. Cardiology diuresing and requesting GI to do a scope prior to Cath. Hb/Hct dropped slightly so she was transfused 1 unit of pRBC's and Heme/Onc Evaluated.   She underwent an EGD today showed a normal esophagus, normal stomach, normal duodenal bulb and the first portion of the duodenum and second portion of the duodenum and no source of anemia was found on endoscopy and no old or fresh blood  was identified.  Patient complaining of some chest pain which she is already on a heparin drip.  She underwent cardiac catheterization yesterday and ended up getting a stent in the distal proximal to mid LAD.  She was transitioned to oral diuretics and placed on  aspirin, statin, Plavix as well as apixaban and continue to for discharge from a cardiac perspective.  Respiratory status is stable as well.  She will need to follow-up with PCP, gastroenterology, cardiology and hematology oncology in the outpatient setting she is stable for discharge.  Discharge Diagnoses:  Active Problems:   CAD S/P percutaneous coronary angioplasty: Prior Cypher DES to pLAD, pOM2 & AVG Cx (after Om2); New DES to AVG Cx ISR & Angiosculpt PTCA of Ostial AVG Cx from OM2.   Acute respiratory failure with hypoxia (HCC)   Anemia   Thrombocytopenia (HCC)   AF (paroxysmal atrial fibrillation) (HCC)   Acute on chronic respiratory failure with hypercapnia (HCC)   SOB (shortness of breath)   Acute diastolic CHF (congestive heart failure) (HCC)   Exertional angina (HCC)   Atrial fibrillation with RVR (HCC)   Pressure injury of skin   Acute on Chronic Respiratory Failure with Hypoxia and Hypercarbia requiring noninvasive positive pressure ventilation with BiPAP; now off of BiPAP and onto 4 L of supplemental nasal cannula -Secondary to HCAP vs COPD exacerbation vs Acute Diastolic CHF exacerbation and likely a combination of all; unlikely HCAP so we will stop antibiotics currently -Baseline at 4L and she is currently on this right now  -ABG done and showed a pH of 7.433, PCO2 of 67.1, and PO2 of 131, bicarbonate level of 44.0, and an ABG O2 saturation of 99.4% -No leukocytosis or fever. Admit as PUI for COVID but this was negative so transferred to medical floor -BNP on admission was 187.1 and repeat was 212.2 -Started on vancomycin and Zosyn in ED- switched vancomycin and switch to Cefepime but will stop Abx Now  -Repeat CXR 04/22/2019 showed "No acute cardiopulmonary findings. Stable emphysematous changes." -Obtained PCT was <0.10 -Duonebs q6hr changed to Xopenex/Atrovent for Improvement and because of her history of proximal atrial fibrillation -Steroids now change to po Prednisone   -Unfortunately do not have records of her latest echo results from recent admission so will repeat but not able to be done given her fast heart rates today -Appears somewhat volume overloaded -Will continue IV Lasix but Cardiology increased 74m BID they are recommending continuing today and change to p.o. tomorrow; Diuretic Management per Cardiology  -Continue to monitor Is and Os. Daily weights.  Cardiology recommending continuing diuresis and planning Cath on Monday  -Repeat chest x-ray in a.m. -Repeat echocardiogram as below showed a EF of 60 to 65% with normal LV function mildly increased LVH along with impaired diastolic parameters consistent with grade 1 diastolic dysfunction -Continue supplemental oxygen and wean off of BiPAP to nasal cannula to her home 4 L and now is on 4 Liters -Continuous pulse oximetry and maintain O2 saturations greater than 90 -We will need ambulatory home O2 screen prior to discharge -Cardiac catheterization done yesterday and showed the patient has some stenosis and had stent placed. -Improved and Stable now and is at baseline   Acute Encephalopathy likely in the setting of hypercarbia rule out other etiologies, improved -Remains somewhat confused but does have donepezil on her MAR and question if she has a history of dementia; per neurology records she does have some memory impairment which has been progressively gotten worse -Check TSH 0.834;  RPR was nonreactive and B12 level was 384 -Correct hypercarbia and metabolic issues if still not improving may consider further imaging with EEG and head CT and possible MRI will hold off for now since she did improve some -Obtain SLP evaluation and she is at a mild aspiration risk and they recommend regular diet with thin liquids -Treat infectious causes with pneumonia and continue antibiotics for now -Improved significantly   Left LEAsymmetry -Left lower extremity appears circumferentially larger than right.   -Will obtain left DVT venous Doppler ultrasound and did not show DVT  CAD s/p multiple stents and now S/P stent 04/22/19 Chest Pain; intermittent  -Decision made by family with cardiology on 1/26 to hold aspirin and plavix due to recent acute on chronic anemia but now she is going to be placed on Plavix and aspirin per cardiology given her recent stent placement next-we will defer anticoagulation recommendations to cardiology as she also has proximal atrial fibrillation -Initially holding her metoprolol and losartan for her borderline blood pressure and n.p.o. status given her confusion but both have improved  -Resumed her atorvastatin 80 mg p.o. daily now since she is safe enough to swallow along with her losartan 25 mg daily and metoprolol succinate 25 mg p.o. daily -Admitted intermittent chest pain on my examination and states she is not SOB but has some chest tightness which is midsternal that comes and goes -Obtain EKG and we will add Nitropaste 1 inch every 6 as needed -Cardiology is consulted for further evaluation recommendations and they are planning a cardiac catheterization; patient's Aspirin and Plavix was on hold due to concern for GI bleeding but cardiology has resumed this dual antiplatelet therapy stent was placed yesterda; Dr. Claiborne Billings recommends that aspirin can be discontinued 4 weeks after therapy -Will be on ASA, Plavix and Apixaban for 4 weeks and then just Plavix and Apixaban for the rest of the year and then just Apixaban -EGD done and showed no evidence of any GI bleeding -Currently on Heparin gtt for now and will defer to cardiology to change to NOAC versus other given her proximal atrial fibrillation with RVR  Acute COPD exacerbation -As above -Patient takes albuterol inhalers as well as Symbicort and albuterol nebulizers in the outpatient setting as well as to Spiriva -Continue with steroids, nebs, supplemental oxygen, as well as antibiotics as above -Currently is on  Ipratropium/Xopenex 3 times daily  -IV Solu-Medrol been changed to p.o. prednisone and will send home on Sterapred  -C/w along with 4 L supplemental oxygen via nasal cannula  -Stopped antibiotic coverage with cefepime and vancomycin as do not clinically feel that she has a pneumonia  Acute on Chronic Diastolic CHF -As above -ECHOCardiogram repeated as below  -She takes furosemide 40 mg p.o. twice daily at home and will resume at D/C -Also takes losartan 25 mg p.o. daily, metoprolol tartrate 25 mg p.o. daily this has been resumed -Cardiology increased her diuresis to IV 40 mg BID and will defer to them for further diuretic management as they were planning on changing to p.o. -Strict I's and O's and daily weights -Continue to monitor for signs and symptoms of volume overload and repeat echocardiogram currently -Question of A. fib worsened her heart failure -Patient is - 6.817 L since admission and weight is not done -Cardiology consulted for further evaluation recommendations and cleared for D/C  GERD -Takes omeprazole 40 mg p.o. daily before breakfast and will resume with Hospital Substitution  -Continue at D/C   History of Breast Cancer -  She is status post chemotherapy and radiation -Currently on letrozole and this has been resumed  Normocytic Anemia  -Appears chronic in nature -Patient hemoglobin/hematocrit on admission was 10.2/30.0 and then dropped to 8.4/29.5 -> 8.2/27.1 -> 7.3/24.4 -> 9.6/30.2 after 1 unit of PRBCs and repeat yesterday was 9.9/31.6 and today was 10.9/34.4 -Checked anemia panel and showed an iron level of 55, U IBC of 267, TIBC of 322, saturation ratios of 17%, ferritin level 288, folate level 29.2, vitamin B12 384 -Continue to monitor for signs and symptoms of bleeding -She is being started on anticoagulation with heparin drip for her atrial fibrillation with RVR; currently no overt bleeding noted and she is also to be started on aspirin and Plavix again due to  her recent stent placement; Cardiology to manage anticoagulants and antiplatelets -Repeat CBC in a.m.  -A decision was made with the patient and the patient's cardiologist to hold her aspirin and Plavix due to recent acute on chronic anemia -Gastroenterology consulted for further evaluation they recommended continuing PPI and took the patient for endoscopy today which was normal; GI to reach out to cardiology to see if cardiology requires any further GI testing prior to her catheterization and will have outpatient Colonoscopy  -Do not clinically feel that she has hemolysis given normal bilirubin and I do not feel that this is an issue of bone marrow production currently given normal reticulocyte count -Because her hemoglobin dropped slightly yesterday hematology oncology was consulted for further evaluation and have ordered SPEP and IFE's with light chain levels as well as reviewed the peripheral smear which showed no significant schistocytes  -Hematology recommended keeping hemoglobin above 8  -If hemoglobin/hematocrit remains persistently low in the outpatient setting then oncology will consider a bone marrow biopsy to rule out MDS -Follow up with Heme/Onc and repeat CBC within 1 week   Thrombocytopenia  -Unclear etiology but could be infectious in the setting of her pneumonia.  -Platelet count is now improved and is 184,000 -No signs of active bleed. Continue to monitor for signs and symptoms of bleeding -Repeat CBC within 1 week   Hyponatremia -Mild secondary to diuresis and was improved to 139 -Continue to monitor and trend and repeat CMP within 1 week   Hypokalemia -Patient's potassium this morning was 3.6 -Yesterday was low in the setting of diuresis -Continue to monitor and replete as necessary -Repeat CMP within 1 week   Hypophosphatemia -Patient's phosphorus level this morning was 3.9 -Continue to monitor and replete as necessary -Repeat phosphorus level within 1 week    Paroxysmal Atrial Fibrillation -Not on anticoagulation previously but now has been started with a heparin drip.   Went into RVR during his hospitalization with rates in the 150s and and spontaneously converted to normal sinus rhythm and sinus tachycardia -Currently held metoprolol, diltiazem for now until she can safely swallow but now that she is passed she can resume and this is been restarted -Continue with Telemetry monitoring -Cardiology consulted for further evaluation recommendations -Cardiology started the patient on IV heparin drip given her elevated CHA2DS2-VASc of 5 and will defer to them to continue anticoagulation given that she is now received a stent and they are recommending Apixaban for D/C -Cardiology recommending GI evaluation to evaluate for her anemia and this was done; GI to do outpatient Colonoscopy  -Further care per Cardiology at D/C and She will be D/C'd on ASA, Plavix, and Apixaban   Hyperbilirubinemia, improved -T bili was 2.0 and improved to 0.6 -Continue monitor and trend  and repeat CMP within 1 week  Hyperglycemia in the setting of Pre-Diabetes -In the setting of steroid demargination and Hx of Diabetes Mellitus  -Checked Hemoglobin A1c and was 6.2  -Patient's blood sugars had been ranging from 243-279 and blood sugar this morning on CMP was 209 -Added Moderate NovoLog/scale insulin before meals and at bedtime -Now CBGs are ranging from 138-203  Discharge Instructions  Discharge Instructions    (HEART FAILURE PATIENTS) Call MD:  Anytime you have any of the following symptoms: 1) 3 pound weight gain in 24 hours or 5 pounds in 1 week 2) shortness of breath, with or without a dry hacking cough 3) swelling in the hands, feet or stomach 4) if you have to sleep on extra pillows at night in order to breathe.   Complete by: As directed    Amb Referral to Cardiac Rehabilitation   Complete by: As directed    Diagnosis:  Coronary Stents PTCA     After initial  evaluation and assessments completed: Virtual Based Care may be provided alone or in conjunction with Phase 2 Cardiac Rehab based on patient barriers.: Yes   Call MD for:  difficulty breathing, headache or visual disturbances   Complete by: As directed    Call MD for:  extreme fatigue   Complete by: As directed    Call MD for:  hives   Complete by: As directed    Call MD for:  persistant dizziness or light-headedness   Complete by: As directed    Call MD for:  persistant nausea and vomiting   Complete by: As directed    Call MD for:  redness, tenderness, or signs of infection (pain, swelling, redness, odor or green/yellow discharge around incision site)   Complete by: As directed    Call MD for:  severe uncontrolled pain   Complete by: As directed    Call MD for:  temperature >100.4   Complete by: As directed    Diet - low sodium heart healthy   Complete by: As directed    1500 mL Fluid Restriction   Discharge instructions   Complete by: As directed    You were cared for by a hospitalist during your hospital stay. If you have any questions about your discharge medications or the care you received while you were in the hospital after you are discharged, you can call the unit and ask to speak with the hospitalist on call if the hospitalist that took care of you is not available. Once you are discharged, your primary care physician will handle any further medical issues. Please note that NO REFILLS for any discharge medications will be authorized once you are discharged, as it is imperative that you return to your primary care physician (or establish a relationship with a primary care physician if you do not have one) for your aftercare needs so that they can reassess your need for medications and monitor your lab values.  Follow up with PCP, Cardiology, Hematology/Oncology, and Gastroenterology as needed. Take all medications as prescribed. If symptoms change or worsen please return to the ED  for evaluation   Increase activity slowly   Complete by: As directed      Allergies as of 04/23/2019   No Known Allergies     Medication List    STOP taking these medications   acetaminophen-codeine 300-30 MG tablet Commonly known as: TYLENOL #3   [place] 50 MG Pack Generic drug: Diclofenac Potassium(Migraine)     TAKE these medications  acetaminophen 325 MG tablet Commonly known as: TYLENOL Take 2 tablets (650 mg total) by mouth every 4 (four) hours as needed for headache or mild pain.   acidophilus Caps capsule Take 1 capsule by mouth 2 (two) times daily.   AIMOVIG [place] Inject 1 Dose into the skin as directed.   albuterol 108 (90 Base) MCG/ACT inhaler Commonly known as: Ventolin HFA Inhale 2 puffs into the lungs every 6 (six) hours as needed for wheezing or shortness of breath. What changed: Another medication with the same name was changed. Make sure you understand how and when to take each.   albuterol (2.5 MG/3ML) 0.083% nebulizer solution Commonly known as: PROVENTIL USE 1 VIAL IN NEBULIZER EVERY 4 HOURS AS NEEDED FOR WHEEZING OR SHORTNESS OF BREATH. What changed:   how much to take  how to take this  when to take this  reasons to take this   apixaban 5 MG Tabs tablet Commonly known as: ELIQUIS Take 1 tablet (5 mg total) by mouth 2 (two) times daily.   aspirin 81 MG chewable tablet Chew 1 tablet (81 mg total) by mouth daily. Start taking on: April 24, 2019   atorvastatin 80 MG tablet Commonly known as: LIPITOR Take 80 mg by mouth daily.   blood glucose meter kit and supplies Dispense based on patient and insurance preference. Use up to four times daily as directed. (FOR ICD-9 250.00, 250.01).   budesonide-formoterol 160-4.5 MCG/ACT inhaler Commonly known as: SYMBICORT Inhale 2 puffs into the lungs 2 (two) times daily.   butalbital-acetaminophen-caffeine 50-325-40 MG tablet Commonly known as: FIORICET Take 1-1.5 tablets by mouth every 8 (eight)  hours as needed for headache or migraine. 1 - 1 and 1/2 every 8 hours as needed for headache   calcium carbonate 600 MG Tabs tablet Commonly known as: OS-CAL Take 600 mg by mouth every morning.   clopidogrel 75 MG tablet Commonly known as: PLAVIX Take 1 tablet (75 mg total) by mouth daily with breakfast. Start taking on: April 24, 2019   diltiazem 120 MG 24 hr capsule Commonly known as: CARDIZEM CD Take 120 mg by mouth daily.   donepezil 5 MG tablet Commonly known as: ARICEPT Take 5 mg by mouth daily.   DULoxetine 60 MG capsule Commonly known as: CYMBALTA Take 60 mg by mouth every morning.   Flutter Devi Use as directed   freestyle lancets Use as instructed   furosemide 40 MG tablet Commonly known as: LASIX Take 1 tablet by mouth 2 (two) times daily.   gabapentin 300 MG capsule Commonly known as: NEURONTIN Take 300 mg by mouth 3 (three) times daily.   Klor-Con M20 20 MEQ tablet Generic drug: potassium chloride SA Take 20 mEq by mouth 2 (two) times daily.   letrozole 2.5 MG tablet Commonly known as: FEMARA Take 2.5 mg by mouth every morning.   losartan 25 MG tablet Commonly known as: COZAAR Take 25 mg by mouth daily.   metFORMIN 1000 MG tablet Commonly known as: GLUCOPHAGE Take 1,000 mg by mouth 2 (two) times daily with a meal.   methocarbamol 750 MG tablet Commonly known as: ROBAXIN Take 750 mg by mouth every 8 (eight) hours as needed for muscle spasms.   metoprolol succinate 25 MG 24 hr tablet Commonly known as: TOPROL-XL Take 25 mg by mouth daily.   Mucinex Fast-Max DM Max 5-100 MG/5ML Liqd Generic drug: Dextromethorphan-guaiFENesin Take 5 mLs by mouth every 4 (four) hours as needed (cough/ congestiom). 1 tsp every 4 hours  as needed for cough/congestion   multivitamin tablet Take 1 tablet by mouth daily.   nitroGLYCERIN 0.4 MG SL tablet Commonly known as: NITROSTAT Place 1 tablet (0.4 mg total) under the tongue every 5 (five) minutes as needed  for chest pain (may repeat x3).   omeprazole 40 MG capsule Commonly known as: PRILOSEC Take 40 mg by mouth daily before breakfast.   ondansetron 8 MG tablet Commonly known as: ZOFRAN Take 8 mg by mouth every 4 (four) hours as needed for nausea.   OXYGEN Use 4L 24/7   predniSONE 10 MG (21) Tbpk tablet Commonly known as: STERAPRED UNI-PAK 21 TAB Take 6 tablets Day 1, 5 Tablets Day 2, 4 Tablets Day 3, 3 Tablets Day 4, 2 Tablets Day 5, 1 Tablet Day 6 and Stop Day 7   Tiotropium Bromide Monohydrate 2.5 MCG/ACT Aers Commonly known as: Spiriva Respimat Inhale 2 puffs into the lungs daily.   Vitamin D 50 MCG (2000 UT) tablet Take 2,000 Units by mouth every morning.       No Known Allergies  Consultations:  Cardiology  Gastroenterology  Heme/Onc  Procedures/Studies: DG Chest 2 View  Result Date: 04/17/2019 CLINICAL DATA:  Weakness and chest pain. EXAM: CHEST - 2 VIEW COMPARISON:  April 04, 2019 FINDINGS: The lungs are hyperinflated. Very mild, hazy left basilar atelectasis and/or infiltrate is noted. This represents a new finding when compared to the prior study. There is no evidence of a pleural effusion or pneumothorax. The heart size and mediastinal contours are within normal limits. There is moderate severity calcification of the aortic arch. The visualized skeletal structures are unremarkable. IMPRESSION: 1. New mild, hazy left basilar atelectasis and/or infiltrate. Electronically Signed   By: Virgina Norfolk M.D.   On: 04/17/2019 16:53   CARDIAC CATHETERIZATION  Result Date: 04/22/2019  2nd Mrg lesion is 20% stenosed.  Previously placed Mid Cx to Dist Cx stent (unknown type) is widely patent.  Prox LAD to Mid LAD lesion is 90% stenosed.  Post intervention, there is a 0% residual stenosis.  A stent was successfully placed.  There is 90% eccentric in-stent restenosis in the distal proximal LAD Cypher stent which was placed in 2005 in a large LAD system. Patent stents in  a dominant left circumflex vessel with 20% intimal hyperplasia in the stent involving the OM1 vessel   and patent stent in the AV groove circumflex. Nondominant normal RCA. LVEDP 2 mm Hg. Successful percutaneous coronary intervention to the previously placed 3.0 x 23 mm Cypher stent in the proximal LAD with 90% in-stent restenosis in the distal aspect of the stent with mild narrowing just beyond the stent treated with multiple cuts with a Wolverine cutting balloon 2.5 x 10 mm, and ultimate stenting with a 3.0 x 18 mm Resolute Onyx stent postdilated to 3.25 mm with the stenosis being reduced to 0%. RECOMMENDATION: DAPT therapy initially with aspirin/Plavix.  If anticoagulation is to be started per primary team due to recent PAF, aspirin can be discontinued after 4 weeks of therapy.   DG CHEST PORT 1 VIEW  Result Date: 04/23/2019 CLINICAL DATA:  Shortness of breath EXAM: PORTABLE CHEST 1 VIEW COMPARISON:  Yesterday FINDINGS: Normal heart size and mediastinal contours. Interstitial coarsening and apical emphysema. Lung volumes are large. There is no edema, consolidation, effusion, or pneumothorax. Vague density over the peripheral left chest at at the level of the scapula is not seen on multiple recent comparisons, suspect this is an EKG pad. Coronary stenting. IMPRESSION: 1. No  evidence of active disease. 2. Emphysema. Electronically Signed   By: Monte Fantasia M.D.   On: 04/23/2019 06:33   DG CHEST PORT 1 VIEW  Result Date: 04/22/2019 CLINICAL DATA:  Shortness of breath. EXAM: PORTABLE CHEST 1 VIEW COMPARISON:  04/21/2019 FINDINGS: The cardiac silhouette, mediastinal and hilar contours are normal and stable. Stable aortic calcifications. Stable emphysematous changes. Resolving/resolved interstitial edema. No infiltrates or effusions. No pulmonary lesions. The bony thorax is intact. IMPRESSION: No acute cardiopulmonary findings. Stable emphysematous changes. Electronically Signed   By: Marijo Sanes M.D.   On:  04/22/2019 05:48   DG CHEST PORT 1 VIEW  Result Date: 04/21/2019 CLINICAL DATA:  Shortness of breath EXAM: PORTABLE CHEST 1 VIEW COMPARISON:  Chest radiograph 04/20/2019 FINDINGS: Monitoring leads overlie the patient. Stable cardiac and mediastinal contours. Aortic atherosclerosis. Emphysematous change. Similar bilateral mid and lower lung interstitial opacities. No pleural effusion or pneumothorax. IMPRESSION: Similar bilateral mid and lower lung interstitial opacities which may represent edema. Emphysema. Electronically Signed   By: Lovey Newcomer M.D.   On: 04/21/2019 07:17   DG CHEST PORT 1 VIEW  Result Date: 04/20/2019 CLINICAL DATA:  Shortness of breath EXAM: PORTABLE CHEST 1 VIEW COMPARISON:  04/19/2019 FINDINGS: Heart size remains normal. Chronic aortic atherosclerosis. Chronic changes of upper lung predominant emphysema with relative upper lung lucency. Slight increase in interstitial prominence in the lower lungs suggest mild interstitial edema. No alveolar edema. No consolidation, collapse or effusion. IMPRESSION: Slight increased prominence of interstitial markings in the lower lungs suggests interstitial edema. Background emphysema. Aortic atherosclerosis. Electronically Signed   By: Nelson Chimes M.D.   On: 04/20/2019 07:46   DG CHEST PORT 1 VIEW  Result Date: 04/19/2019 CLINICAL DATA:  Shortness of breath, COPD, BiPAP EXAM: PORTABLE CHEST 1 VIEW COMPARISON:  Radiograph 04/17/2019 FINDINGS: Chronic hyperinflation. Increasing hazy reticular opacities in the lower lungs. Could reflect developing edema given septal thickening and some cephalized vascularity. No pneumothorax or visible effusion. Cardiomediastinal contours are stable from prior with a calcified aorta. Telemetry leads overlie the chest. No acute osseous or soft tissue abnormality. Degenerative changes are present in the imaged spine and shoulders. IMPRESSION: Increasing basilar interstitial opacities, suspect developing edema.  Electronically Signed   By: Lovena Le M.D.   On: 04/19/2019 06:36   ECHOCARDIOGRAM COMPLETE  Result Date: 04/19/2019   ECHOCARDIOGRAM REPORT   Patient Name:   LIVIA TARR Date of Exam: 04/19/2019 Medical Rec #:  [id number]       Height:       66.0 in Accession #:    [id number]      Weight:       138.4 lb Date of Birth:  [birth year]       BSA:          1.71 m Patient Age:    22 years        BP:           125/61 mmHg Patient Gender: F               HR:           112 bpm. Exam Location:  Inpatient Procedure: 2D Echo, Cardiac Doppler and Color Doppler Indications:    R06.02 SOB  History:        Patient has prior history of Echocardiogram examinations, most                 recent 01/09/2015. Abnormal ECG, COPD and TIA,  Signs/Symptoms:Shortness of Breath and Dyspnea; Risk                 Factors:Hypertension and Current Smoker. ETOH. Cyanosis.  Sonographer:    Roseanna Rainbow RDCS Referring Phys: [id number] [facility] LATIF [place]  1. Left ventricular ejection fraction, by visual estimation, is 60 to 65%. The left ventricle has normal function. There is mildly increased left ventricular hypertrophy.  2. Left ventricular diastolic parameters are consistent with Grade I diastolic dysfunction (impaired relaxation).  3. The left ventricle has no regional wall motion abnormalities.  4. Global right ventricle has normal systolic function.The right ventricular size is normal. No increase in right ventricular wall thickness.  5. Left atrial size was normal.  6. Right atrial size was normal.  7. The mitral valve is normal in structure. No evidence of mitral valve regurgitation.  8. The tricuspid valve is grossly normal.  9. The tricuspid valve is grossly normal. Tricuspid valve regurgitation is trivial. 10. The aortic valve is normal in structure. Aortic valve regurgitation is not visualized. 11. The pulmonic valve was normal in structure. Pulmonic valve regurgitation is not visualized. 12. Moderately  elevated pulmonary artery systolic pressure. 13. Pulmonary hypertension is moderate. 14. The atrial septum is grossly normal. FINDINGS  Left Ventricle: Left ventricular ejection fraction, by visual estimation, is 60 to 65%. The left ventricle has normal function. The left ventricle has no regional wall motion abnormalities. There is mildly increased left ventricular hypertrophy. Left ventricular diastolic parameters are consistent with Grade I diastolic dysfunction (impaired relaxation). Right Ventricle: The right ventricular size is normal. No increase in right ventricular wall thickness. Global RV systolic function is has normal systolic function. The tricuspid regurgitant velocity is 2.76 m/s, and with an assumed right atrial pressure  of 15 mmHg, the estimated right ventricular systolic pressure is moderately elevated at 45.5 mmHg. Left Atrium: Left atrial size was normal in size. Right Atrium: Right atrial size was normal in size Pericardium: There is no evidence of pericardial effusion. Mitral Valve: The mitral valve is normal in structure. No evidence of mitral valve regurgitation. Tricuspid Valve: The tricuspid valve is grossly normal. Tricuspid valve regurgitation is trivial. Aortic Valve: The aortic valve is normal in structure. Aortic valve regurgitation is not visualized. Pulmonic Valve: The pulmonic valve was normal in structure. Pulmonic valve regurgitation is not visualized. Pulmonic regurgitation is not visualized. Aorta: The aortic root and ascending aorta are structurally normal, with no evidence of dilitation. Pulmonary Artery: Pulmonary hypertension is moderate. IAS/Shunts: The atrial septum is grossly normal.  LEFT VENTRICLE PLAX 2D LVIDd:         4.50 cm       Diastology LVIDs:         3.10 cm       LV e' lateral:   5.87 cm/s LV PW:         1.40 cm       LV E/e' lateral: 6.7 LV IVS:        1.20 cm       LV e' medial:    5.44 cm/s LVOT diam:     1.90 cm       LV E/e' medial:  7.2 LV SV:          55 ml LV SV Index:   31.84 LVOT Area:     2.84 cm  LV Volumes (MOD) LV area d, A2C:    18.10 cm LV area d, A4C:    20.00 cm LV area s,  A2C:    10.50 cm LV area s, A4C:    10.80 cm LV major d, A2C:   6.61 cm LV major d, A4C:   6.16 cm LV major s, A2C:   5.20 cm LV major s, A4C:   5.33 cm LV vol d, MOD A2C: 41.0 ml LV vol d, MOD A4C: 52.7 ml LV vol s, MOD A2C: 17.2 ml LV vol s, MOD A4C: 18.0 ml LV SV MOD A2C:     23.8 ml LV SV MOD A4C:     52.7 ml LV SV MOD BP:      30.4 ml RIGHT VENTRICLE             IVC RV S prime:     12.10 cm/s  IVC diam: 2.10 cm TAPSE (M-mode): 1.8 cm LEFT ATRIUM             Index LA diam:        2.80 cm 1.64 cm/m LA Vol (A2C):   28.1 ml 16.43 ml/m LA Vol (A4C):   20.6 ml 12.04 ml/m LA Biplane Vol: 23.8 ml 13.91 ml/m  AORTIC VALVE LVOT Vmax:   90.80 cm/s LVOT Vmean:  63.900 cm/s LVOT VTI:    0.153 m  AORTA Ao Root diam: 3.10 cm MITRAL VALVE                        TRICUSPID VALVE MV Area (PHT): 5.27 cm             TR Peak grad:   30.5 mmHg MV PHT:        41.76 msec           TR Vmax:        291.00 cm/s MV Decel Time: 144 msec MV E velocity: 39.34 cm/s 103 cm/s  SHUNTS MV A velocity: 76.60 cm/s 70.3 cm/s Systemic VTI:  0.15 m MV E/A ratio:  0.51       1.5       Systemic Diam: 1.90 cm  Mertie Moores MD Electronically signed by Mertie Moores MD Signature Date/Time: 04/19/2019/5:07:55 PM    Final    VAS [place] LOWER EXTREMITY VENOUS (DVT)  Result Date: 04/18/2019  Lower Venous Study Indications: Swelling, and Edema.  Risk Factors: History of CAD, PVD and COPD. Performing Technologist: Oda Cogan RDMS, RVT  Examination Guidelines: A complete evaluation includes B-mode imaging, spectral Doppler, color Doppler, and power Doppler as needed of all accessible portions of each vessel. Bilateral testing is considered an integral part of a complete examination. Limited examinations for reoccurring indications may be performed as noted.   +-----+---------------+---------+-----------+----------+--------------+ RIGHTCompressibilityPhasicitySpontaneityPropertiesThrombus Aging +-----+---------------+---------+-----------+----------+--------------+ CFV  Full           Yes      Yes                                 +-----+---------------+---------+-----------+----------+--------------+ SFJ  Full                                                        +-----+---------------+---------+-----------+----------+--------------+   +---------+---------------+---------+-----------+----------+--------------+ LEFT     CompressibilityPhasicitySpontaneityPropertiesThrombus Aging +---------+---------------+---------+-----------+----------+--------------+ CFV      Full           Yes  Yes                                 +---------+---------------+---------+-----------+----------+--------------+ SFJ      Full                                                        +---------+---------------+---------+-----------+----------+--------------+ FV Prox  Full                                                        +---------+---------------+---------+-----------+----------+--------------+ FV Mid   Full                                                        +---------+---------------+---------+-----------+----------+--------------+ FV DistalFull                                                        +---------+---------------+---------+-----------+----------+--------------+ PFV      Full                                                        +---------+---------------+---------+-----------+----------+--------------+ POP      Full           Yes      Yes                                 +---------+---------------+---------+-----------+----------+--------------+ PTV      Full                                                        +---------+---------------+---------+-----------+----------+--------------+  PERO     Full                                                        +---------+---------------+---------+-----------+----------+--------------+     Summary: Right: No evidence of common femoral vein obstruction. Left: There is no evidence of deep vein thrombosis in the lower extremity. No cystic structure found in the popliteal fossa.  *See table(s) above for measurements and observations. Electronically signed by Monica Martinez MD on 04/18/2019 at 4:12:37 PM.    Final    ECHOCARDIOGRAM  IMPRESSIONS    1. Left ventricular ejection fraction, by visual estimation, is 60 to  65%. The left ventricle has normal function. There is mildly increased  left ventricular hypertrophy.  2. Left ventricular diastolic parameters are consistent with Grade I  diastolic dysfunction (impaired relaxation).  3. The left ventricle has no regional wall motion abnormalities.  4. Global right ventricle has normal systolic function.The right  ventricular size is normal. No increase in right ventricular wall  thickness.  5. Left atrial size was normal.  6. Right atrial size was normal.  7. The mitral valve is normal in structure. No evidence of mitral valve  regurgitation.  8. The tricuspid valve is grossly normal.  9. The tricuspid valve is grossly normal. Tricuspid valve regurgitation  is trivial.  10. The aortic valve is normal in structure. Aortic valve regurgitation is  not visualized.  11. The pulmonic valve was normal in structure. Pulmonic valve  regurgitation is not visualized.  12. Moderately elevated pulmonary artery systolic pressure.  13. Pulmonary hypertension is moderate.  14. The atrial septum is grossly normal.   FINDINGS  Left Ventricle: Left ventricular ejection fraction, by visual estimation,  is 60 to 65%. The left ventricle has normal function. The left ventricle  has no regional wall motion abnormalities. There is mildly increased left  ventricular hypertrophy. Left   ventricular diastolic parameters are consistent with Grade I diastolic  dysfunction (impaired relaxation).   Right Ventricle: The right ventricular size is normal. No increase in  right ventricular wall thickness. Global RV systolic function is has  normal systolic function. The tricuspid regurgitant velocity is 2.76 m/s,  and with an assumed right atrial pressure  of 15 mmHg, the estimated right ventricular systolic pressure is  moderately elevated at 45.5 mmHg.   Left Atrium: Left atrial size was normal in size.   Right Atrium: Right atrial size was normal in size   Pericardium: There is no evidence of pericardial effusion.   Mitral Valve: The mitral valve is normal in structure. No evidence of  mitral valve regurgitation.   Tricuspid Valve: The tricuspid valve is grossly normal. Tricuspid valve  regurgitation is trivial.   Aortic Valve: The aortic valve is normal in structure. Aortic valve  regurgitation is not visualized.   Pulmonic Valve: The pulmonic valve was normal in structure. Pulmonic valve  regurgitation is not visualized. Pulmonic regurgitation is not visualized.   Aorta: The aortic root and ascending aorta are structurally normal, with  no evidence of dilitation.   Pulmonary Artery: Pulmonary hypertension is moderate.   IAS/Shunts: The atrial septum is grossly normal.     LEFT VENTRICLE  PLAX 2D  LVIDd:     4.50 cm    Diastology  LVIDs:     3.10 cm    LV e' lateral:  5.87 cm/s  LV PW:     1.40 cm    LV E/e' lateral: 6.7  LV IVS:    1.20 cm    LV e' medial:  5.44 cm/s  LVOT diam:   1.90 cm    LV E/e' medial: 7.2  LV SV:     55 ml  LV SV Index:  31.84  LVOT Area:   2.84 cm    LV Volumes (MOD)  LV area d, A2C:  18.10 cm  LV area d, A4C:  20.00 cm  LV area s, A2C:  10.50 cm  LV area s, A4C:  10.80 cm  LV major d, A2C:  6.61 cm  LV major d, A4C:  6.16 cm  LV major s, A2C:  5.20 cm  LV  major s, A4C:  5.33 cm  LV vol d, MOD A2C: 41.0 ml  LV vol d, MOD A4C: 52.7 ml  LV vol s, MOD A2C: 17.2 ml  LV vol s, MOD A4C: 18.0 ml  LV SV MOD A2C:   23.8 ml  LV SV MOD A4C:   52.7 ml  LV SV MOD BP:   30.4 ml   RIGHT VENTRICLE       IVC  RV S prime:   12.10 cm/s IVC diam: 2.10 cm  TAPSE (M-mode): 1.8 cm   LEFT ATRIUM       Index  LA diam:    2.80 cm 1.64 cm/m  LA Vol (A2C):  28.1 ml 16.43 ml/m  LA Vol (A4C):  20.6 ml 12.04 ml/m  LA Biplane Vol: 23.8 ml 13.91 ml/m  AORTIC VALVE  LVOT Vmax:  90.80 cm/s  LVOT Vmean: 63.900 cm/s  LVOT VTI:  0.153 m    AORTA  Ao Root diam: 3.10 cm   MITRAL VALVE            TRICUSPID VALVE  MV Area (PHT): 5.27 cm       TR Peak grad:  30.5 mmHg  MV PHT:    41.76 msec      TR Vmax:    291.00 cm/s  MV Decel Time: 144 msec  MV E velocity: 39.34 cm/s 103 cm/s SHUNTS  MV A velocity: 76.60 cm/s 70.3 cm/s Systemic VTI: 0.15 m  MV E/A ratio: 0.51    1.5    Systemic Diam: 1.90 cm   EGD 04/21/2019 Findings: The examined esophagus was normal. The entire examined stomach was normal. The duodenal bulb, first portion of the duodenum and second portion of  the duodenum were normal. Impression: - Normal esophagus. - Normal stomach. - Normal duodenal bulb, first portion of the  duodenum and second portion of the duodenum. - No source of anemia seen on today's endoscopy. No  old or fresh blood was identified on today's  endoscopy.  CARDIAC CATHETERIZATION  2nd Mrg lesion is 20% stenosed.  Previously placed Mid Cx to Dist Cx stent (unknown type) is widely patent.  Prox LAD to Mid LAD lesion is 90% stenosed.  Post intervention, there is a 0% residual stenosis.  A stent was  successfully placed.  There is 90% eccentric in-stent restenosis in the distal proximal LAD Cypher stent which was placed in 2005 in a large LAD system.  Patent stents in a dominant left circumflex vessel with 20% intimal hyperplasia in the stent involving the OM1 vessel and patent stent in the AV groove circumflex.  Nondominant normal RCA.  LVEDP 2 mm Hg.  Successful percutaneous coronary intervention to the previously placed 3.0 x 23 mm Cypher stent in the proximal LAD with 90% in-stent restenosis in the distal aspect of the stent with mild narrowing just beyond the stent treated with multiple cuts with a Wolverine cutting balloon 2.5 x 10 mm, and ultimate stenting with a 3.0 x 18 mm Resolute Onyx stent postdilated to 3.25 mm with the stenosis being reduced to 0%.  Subjective: Seen and examined at bedside and she was doing well but was very anxious.  No chest pain, lightheadedness or dizziness.  States her breathing is stable.  She is stable for discharge and will need to follow with PCP, heme-onc, cardiology as well as gastroenterology in the outpatient setting and she is understandable and agreeable with plan of care.  Discharge Exam: Vitals:   04/23/19  0515 04/23/19 1442  BP: 124/75   Pulse: 95   Resp: 19   Temp:    SpO2: 98% 95%   Vitals:   04/22/19 1953 04/22/19 2154 04/23/19 0515 04/23/19 1442  BP:   124/75   Pulse:   95   Resp:   19   Temp:  98.5 F (36.9 C)    TempSrc:  Oral    SpO2: 96%  98% 95%  Weight:   61.5 kg   Height:       General: Pt is alert, awake, not in acute distress Cardiovascular: Slightly tachycardic Rate, S1/S2 +, no rubs, no gallops Respiratory: Diminished bilaterally, no wheezing, no rhonchi; Wearing 4 Liters of Supplemental O2 via [place] Abdominal: Soft, NT, ND, bowel sounds + Extremities: Mild edema, no cyanosis  The results of significant diagnostics from this hospitalization (including imaging, microbiology, ancillary and laboratory) are  listed below for reference.    Microbiology: Recent Results (from the past 240 hour(s))  Blood culture (routine x 2)     Status: None   Collection Time: 04/17/19  8:50 PM   Specimen: BLOOD  Result Value Ref Range Status   Specimen Description BLOOD RIGHT ARM  Final   Special Requests   Final    BOTTLES DRAWN AEROBIC AND ANAEROBIC Blood Culture results may not be optimal due to an inadequate volume of blood received in culture bottles   Culture   Final    NO GROWTH 5 DAYS Performed at [facility], [place] [address], [place], [place] [address]    Report Status 04/22/2019 FINAL  Final  Blood culture (routine x 2)     Status: None   Collection Time: 04/17/19  9:07 PM   Specimen: BLOOD  Result Value Ref Range Status   Specimen Description BLOOD RIGHT FOREARM  Final   Special Requests   Final    BOTTLES DRAWN AEROBIC AND ANAEROBIC Blood Culture adequate volume   Culture   Final    NO GROWTH 5 DAYS Performed at [facility], [place] [address] [place], [place] [address]    Report Status 04/22/2019 FINAL  Final  Respiratory Panel by RT PCR (Flu A&B, Covid) - Nasopharyngeal Swab     Status: None   Collection Time: 04/17/19  9:40 PM   Specimen: Nasopharyngeal Swab  Result Value Ref Range Status   SARS Coronavirus 2 by RT PCR NEGATIVE NEGATIVE Final    Comment: (NOTE) SARS-CoV-2 target nucleic acids are NOT DETECTED. The SARS-CoV-2 RNA is generally detectable in upper respiratoy specimens during the acute phase of infection. The lowest concentration of SARS-CoV-2 viral copies this assay can detect is 131 copies/mL. A negative result does not preclude SARS-Cov-2 infection and should not be used as the sole basis for treatment or other patient management decisions. A negative result may occur with  improper specimen collection/handling, submission of specimen other than nasopharyngeal swab, presence of viral mutation(s) within the areas targeted by this assay, and inadequate  number of viral copies (<131 copies/mL). A negative result must be combined with clinical observations, patient history, and epidemiological information. The expected result is Negative. Fact Sheet for Patients:  PinkCheek.be Fact Sheet for Healthcare Providers:  GravelBags.it This test is not yet ap proved or cleared by the [place] FDA and  has been authorized for detection and/or diagnosis of SARS-CoV-2 by FDA under an Emergency Use Authorization (EUA). This EUA will remain  in effect (meaning this test can be used) for the duration  of the COVID-19 declaration under Section 564(b)(1) of the Act, 21 U.S.C. section 360bbb-3(b)(1), unless the authorization is terminated or revoked sooner.    Influenza A by PCR NEGATIVE NEGATIVE Final   Influenza B by PCR NEGATIVE NEGATIVE Final    Comment: (NOTE) The Xpert Xpress SARS-CoV-2/FLU/RSV assay is intended as an aid in  the diagnosis of influenza from Nasopharyngeal swab specimens and  should not be used as a sole basis for treatment. Nasal washings and  aspirates are unacceptable for Xpert Xpress SARS-CoV-2/FLU/RSV  testing. Fact Sheet for Patients: PinkCheek.be Fact Sheet for Healthcare Providers: GravelBags.it This test is not yet approved or cleared by the [place] FDA and  has been authorized for detection and/or diagnosis of SARS-CoV-2 by  FDA under an Emergency Use Authorization (EUA). This EUA will remain  in effect (meaning this test can be used) for the duration of the  Covid-19 declaration under Section 564(b)(1) of the Act, 21  U.S.C. section 360bbb-3(b)(1), unless the authorization is  terminated or revoked. Performed at [facility], [place] [address], [place], [place] [address]   SARS CORONAVIRUS 2 (TAT 6-24 HRS) Nasopharyngeal Nasopharyngeal Swab     Status: None   Collection Time: 04/18/19  12:35 AM   Specimen: Nasopharyngeal Swab  Result Value Ref Range Status   SARS Coronavirus 2 NEGATIVE NEGATIVE Final    Comment: (NOTE) SARS-CoV-2 target nucleic acids are NOT DETECTED. The SARS-CoV-2 RNA is generally detectable in upper and lower respiratory specimens during the acute phase of infection. Negative results do not preclude SARS-CoV-2 infection, do not rule out co-infections with other pathogens, and should not be used as the sole basis for treatment or other patient management decisions. Negative results must be combined with clinical observations, patient history, and epidemiological information. The expected result is Negative. Fact Sheet for Patients: SugarRoll.be Fact Sheet for Healthcare Providers: https://www.woods-mathews.com/ This test is not yet approved or cleared by the [place] FDA and  has been authorized for detection and/or diagnosis of SARS-CoV-2 by FDA under an Emergency Use Authorization (EUA). This EUA will remain  in effect (meaning this test can be used) for the duration of the COVID-19 declaration under Section 56 4(b)(1) of the Act, 21 U.S.C. section 360bbb-3(b)(1), unless the authorization is terminated or revoked sooner. Performed at [facility], [place] [address] [place], [place] [address]   MRSA PCR Screening     Status: None   Collection Time: 04/20/19  8:21 AM   Specimen: Nasal Swab; Nasopharyngeal  Result Value Ref Range Status   MRSA by PCR NEGATIVE NEGATIVE Final    Comment:        The GeneXpert MRSA Assay (FDA approved for NASAL specimens only), is one component of a comprehensive MRSA colonization surveillance program. It is not intended to diagnose MRSA infection nor to guide or monitor treatment for MRSA infections. Performed at [facility], [place] [address] [place], [place] [address]     Labs: BNP (last 3 results) Recent Labs    04/17/19 2059 04/19/19 0702   BNP 187.1* 814.4*   Basic Metabolic Panel: Recent Labs  Lab 04/19/19 0515 04/20/19 0509 04/21/19 0224 04/22/19 0248 04/23/19 0218  NA 143 132* 133* 135 139  K 4.3 3.2* 3.7 3.7 3.6  CL 90* 83* 87* 89* 95*  CO2 44* 37* 35* 33* 36*  GLUCOSE 179* 210* 322* 209* 144*  BUN 28* 23 28* 29* 20  CREATININE 0.73 0.63 0.88 0.77 0.74  CALCIUM 8.8*  7.4* 8.3* 8.4* 8.3*  MG 1.6* 1.7 2.3 1.9 1.8  PHOS 4.0 3.2 2.0* 2.3* 3.9   Liver Function Tests: Recent Labs  Lab 04/20/19 0509 04/20/19 1150 04/21/19 0224 04/22/19 0248 04/23/19 0218  AST 16 19 16 18 25   ALT 22 24 23 22 30   ALKPHOS 68 71 85 96 104  BILITOT 2.0* 0.7 0.9 0.6 0.6  PROT 5.0* 5.6* 5.7* 5.6* 5.5*  ALBUMIN 2.4* 2.7* 2.7* 2.8* 2.7*   No results for input(s): LIPASE, AMYLASE in the last 168 hours. No results for input(s): AMMONIA in the last 168 hours. CBC: Recent Labs  Lab 04/19/19 0702 04/20/19 0509 04/21/19 0224 04/22/19 0248 04/23/19 0218  WBC 6.8 7.2 9.2 10.1 7.4  NEUTROABS 5.7 6.3 7.2 7.4 4.4  HGB 8.2* 7.3* 9.6* 9.9* 10.9*  HCT 27.1* 24.4* 30.2* 31.6* 34.4*  MCV 91.2 92.4 87.3 89.3 89.1  PLT 158 135* 165 189 184   Cardiac Enzymes: No results for input(s): CKTOTAL, CKMB, CKMBINDEX, TROPONINI in the last 168 hours. BNP: Invalid input(s): POCBNP CBG: Recent Labs  Lab 04/22/19 1122 04/22/19 1635 04/22/19 2156 04/23/19 0825 04/23/19 1153  GLUCAP 106* 224* 138* 149* 203*   D-Dimer No results for input(s): DDIMER in the last 72 hours. Hgb A1c Recent Labs    04/21/19 0911  HGBA1C 6.2*   Lipid Profile Recent Labs    04/23/19 0606  CHOL 154  HDL 51  LDLCALC 85  TRIG 89  CHOLHDL 3.0   Thyroid function studies No results for input(s): TSH, T4TOTAL, T3FREE, THYROIDAB in the last 72 hours.  Invalid input(s): FREET3 Anemia work up No results for input(s): VITAMINB12, FOLATE, FERRITIN, TIBC, IRON, RETICCTPCT in the last 72 hours. Urinalysis    Component Value Date/Time   COLORURINE AMBER  BIOCHEMICALS MAY BE AFFECTED BY COLOR (A) 10/10/2009 2052   APPEARANCEUR CLEAR 10/10/2009 2052   LABSPEC 1.022 10/10/2009 2052   PHURINE 6.0 10/10/2009 2052   GLUCOSEU NEGATIVE 10/10/2009 2052   HGBUR NEGATIVE 10/10/2009 2052   BILIRUBINUR SMALL (A) 10/10/2009 2052   KETONESUR 15 (A) 10/10/2009 2052   PROTEINUR NEGATIVE 10/10/2009 2052   UROBILINOGEN 1.0 10/10/2009 2052   NITRITE NEGATIVE 10/10/2009 2052   LEUKOCYTESUR SMALL (A) 10/10/2009 2052   Sepsis Labs Invalid input(s): PROCALCITONIN,  WBC,  LACTICIDVEN Microbiology Recent Results (from the past 240 hour(s))  Blood culture (routine x 2)     Status: None   Collection Time: 04/17/19  8:50 PM   Specimen: BLOOD  Result Value Ref Range Status   Specimen Description BLOOD RIGHT ARM  Final   Special Requests   Final    BOTTLES DRAWN AEROBIC AND ANAEROBIC Blood Culture results may not be optimal due to an inadequate volume of blood received in culture bottles   Culture   Final    NO GROWTH 5 DAYS Performed at [facility], [place] [address] [place], [place] [address]    Report Status 04/22/2019 FINAL  Final  Blood culture (routine x 2)     Status: None   Collection Time: 04/17/19  9:07 PM   Specimen: BLOOD  Result Value Ref Range Status   Specimen Description BLOOD RIGHT FOREARM  Final   Special Requests   Final    BOTTLES DRAWN AEROBIC AND ANAEROBIC Blood Culture adequate volume   Culture   Final    NO GROWTH 5 DAYS Performed at [facility], [place] [address] [place], [place] [address]    Report Status 04/22/2019 FINAL  Final  Respiratory Panel by RT PCR (Flu A&B, Covid) - Nasopharyngeal Swab     Status: None   Collection Time: 04/17/19  9:40 PM   Specimen: Nasopharyngeal Swab  Result Value Ref Range Status   SARS Coronavirus 2 by RT PCR NEGATIVE NEGATIVE Final    Comment: (NOTE) SARS-CoV-2 target nucleic acids are NOT DETECTED. The SARS-CoV-2 RNA is generally detectable in upper respiratoy specimens during  the acute phase of infection. The lowest concentration of SARS-CoV-2 viral copies this assay can detect is 131 copies/mL. A negative result does not preclude SARS-Cov-2 infection and should not be used as the sole basis for treatment or other patient management decisions. A negative result may occur with  improper specimen collection/handling, submission of specimen other than nasopharyngeal swab, presence of viral mutation(s) within the areas targeted by this assay, and inadequate number of viral copies (<131 copies/mL). A negative result must be combined with clinical observations, patient history, and epidemiological information. The expected result is Negative. Fact Sheet for Patients:  PinkCheek.be Fact Sheet for Healthcare Providers:  GravelBags.it This test is not yet ap proved or cleared by the [place] FDA and  has been authorized for detection and/or diagnosis of SARS-CoV-2 by FDA under an Emergency Use Authorization (EUA). This EUA will remain  in effect (meaning this test can be used) for the duration of the COVID-19 declaration under Section 564(b)(1) of the Act, 21 U.S.C. section 360bbb-3(b)(1), unless the authorization is terminated or revoked sooner.    Influenza A by PCR NEGATIVE NEGATIVE Final   Influenza B by PCR NEGATIVE NEGATIVE Final    Comment: (NOTE) The Xpert Xpress SARS-CoV-2/FLU/RSV assay is intended as an aid in  the diagnosis of influenza from Nasopharyngeal swab specimens and  should not be used as a sole basis for treatment. Nasal washings and  aspirates are unacceptable for Xpert Xpress SARS-CoV-2/FLU/RSV  testing. Fact Sheet for Patients: PinkCheek.be Fact Sheet for Healthcare Providers: GravelBags.it This test is not yet approved or cleared by the [place] FDA and  has been authorized for detection and/or diagnosis of  SARS-CoV-2 by  FDA under an Emergency Use Authorization (EUA). This EUA will remain  in effect (meaning this test can be used) for the duration of the  Covid-19 declaration under Section 564(b)(1) of the Act, 21  U.S.C. section 360bbb-3(b)(1), unless the authorization is  terminated or revoked. Performed at [facility], [place] [address], [place], [place] [address]   SARS CORONAVIRUS 2 (TAT 6-24 HRS) Nasopharyngeal Nasopharyngeal Swab     Status: None   Collection Time: 04/18/19 12:35 AM   Specimen: Nasopharyngeal Swab  Result Value Ref Range Status   SARS Coronavirus 2 NEGATIVE NEGATIVE Final    Comment: (NOTE) SARS-CoV-2 target nucleic acids are NOT DETECTED. The SARS-CoV-2 RNA is generally detectable in upper and lower respiratory specimens during the acute phase of infection. Negative results do not preclude SARS-CoV-2 infection, do not rule out co-infections with other pathogens, and should not be used as the sole basis for treatment or other patient management decisions. Negative results must be combined with clinical observations, patient history, and epidemiological information. The expected result is Negative. Fact Sheet for Patients: SugarRoll.be Fact Sheet for Healthcare Providers: https://www.woods-mathews.com/ This test is not yet approved or cleared by the [place] FDA and  has been authorized for detection and/or diagnosis of SARS-CoV-2 by FDA under an Emergency Use Authorization (EUA). This EUA will remain  in effect (meaning this test can be used)  for the duration of the COVID-19 declaration under Section 56 4(b)(1) of the Act, 21 U.S.C. section 360bbb-3(b)(1), unless the authorization is terminated or revoked sooner. Performed at [facility], [place] [address], [place], [place] [address]   MRSA PCR Screening     Status: None   Collection Time: 04/20/19  8:21 AM   Specimen: Nasal Swab; Nasopharyngeal   Result Value Ref Range Status   MRSA by PCR NEGATIVE NEGATIVE Final    Comment:        The GeneXpert MRSA Assay (FDA approved for NASAL specimens only), is one component of a comprehensive MRSA colonization surveillance program. It is not intended to diagnose MRSA infection nor to guide or monitor treatment for MRSA infections. Performed at [facility], [place] [address] [place], [place] [address]    Time coordinating discharge: 35 minutes  SIGNED:  Kerney Elbe, DO [facility] 04/23/2019, 7:10 PM Pager is on [place]  If 7PM-7AM, please contact night-coverage [email or web address] Password [email or web address]

## 2019-04-23 NOTE — Progress Notes (Addendum)
NURSING PROGRESS NOTE  Patricia Wall [id number] Discharge Data: 04/23/2019 1:49 PM Attending Provider: Kerney Elbe, DO MIW:OEHOZY, Shanon Brow, MD     [name] to be D/C'd home per MD order.  Discussed with the patient the After Visit Summary and all questions fully answered. All IV's discontinued with no bleeding noted. All belongings returned to patient for patient to take home. Pt to be taken downstairs via wheelchair to car. Waiting for pt's ride to get here.  Last Vital Signs:  Blood pressure 124/75, pulse 95, temperature 98.5 F (36.9 C), temperature source Oral, resp. rate 19, height 5' 6"  (1.676 m), weight 61.5 kg, SpO2 98 %.  Discharge Medication List Allergies as of 04/23/2019   No Known Allergies     Medication List    STOP taking these medications   acetaminophen-codeine 300-30 MG tablet Commonly known as: TYLENOL #3   [place] 50 MG Pack Generic drug: Diclofenac Potassium(Migraine)     TAKE these medications   acetaminophen 325 MG tablet Commonly known as: TYLENOL Take 2 tablets (650 mg total) by mouth every 4 (four) hours as needed for headache or mild pain.   acidophilus Caps capsule Take 1 capsule by mouth 2 (two) times daily.   AIMOVIG [place] Inject 1 Dose into the skin as directed.   albuterol 108 (90 Base) MCG/ACT inhaler Commonly known as: Ventolin HFA Inhale 2 puffs into the lungs every 6 (six) hours as needed for wheezing or shortness of breath. What changed: Another medication with the same name was changed. Make sure you understand how and when to take each.   albuterol (2.5 MG/3ML) 0.083% nebulizer solution Commonly known as: PROVENTIL USE 1 VIAL IN NEBULIZER EVERY 4 HOURS AS NEEDED FOR WHEEZING OR SHORTNESS OF BREATH. What changed:   how much to take  how to take this  when to take this  reasons to take this   apixaban 5 MG Tabs tablet Commonly known as: ELIQUIS Take 1 tablet (5 mg total) by mouth 2 (two) times daily.   aspirin  81 MG chewable tablet Chew 1 tablet (81 mg total) by mouth daily. Start taking on: April 24, 2019   atorvastatin 80 MG tablet Commonly known as: LIPITOR Take 80 mg by mouth daily.   blood glucose meter kit and supplies Dispense based on patient and insurance preference. Use up to four times daily as directed. (FOR ICD-9 250.00, 250.01).   budesonide-formoterol 160-4.5 MCG/ACT inhaler Commonly known as: SYMBICORT Inhale 2 puffs into the lungs 2 (two) times daily.   butalbital-acetaminophen-caffeine 50-325-40 MG tablet Commonly known as: FIORICET Take 1-1.5 tablets by mouth every 8 (eight) hours as needed for headache or migraine. 1 - 1 and 1/2 every 8 hours as needed for headache   calcium carbonate 600 MG Tabs tablet Commonly known as: OS-CAL Take 600 mg by mouth every morning.   clopidogrel 75 MG tablet Commonly known as: PLAVIX Take 1 tablet (75 mg total) by mouth daily with breakfast. Start taking on: April 24, 2019   diltiazem 120 MG 24 hr capsule Commonly known as: CARDIZEM CD Take 120 mg by mouth daily.   donepezil 5 MG tablet Commonly known as: ARICEPT Take 5 mg by mouth daily.   DULoxetine 60 MG capsule Commonly known as: CYMBALTA Take 60 mg by mouth every morning.   Flutter Devi Use as directed   freestyle lancets Use as instructed   furosemide 40 MG tablet Commonly known as: LASIX Take 1 tablet by mouth 2 (  two) times daily.   gabapentin 300 MG capsule Commonly known as: NEURONTIN Take 300 mg by mouth 3 (three) times daily.   Klor-Con M20 20 MEQ tablet Generic drug: potassium chloride SA Take 20 mEq by mouth 2 (two) times daily.   letrozole 2.5 MG tablet Commonly known as: FEMARA Take 2.5 mg by mouth every morning.   losartan 25 MG tablet Commonly known as: COZAAR Take 25 mg by mouth daily.   metFORMIN 1000 MG tablet Commonly known as: GLUCOPHAGE Take 1,000 mg by mouth 2 (two) times daily with a meal.   methocarbamol 750 MG  tablet Commonly known as: ROBAXIN Take 750 mg by mouth every 8 (eight) hours as needed for muscle spasms.   metoprolol succinate 25 MG 24 hr tablet Commonly known as: TOPROL-XL Take 25 mg by mouth daily.   Mucinex Fast-Max DM Max 5-100 MG/5ML Liqd Generic drug: Dextromethorphan-guaiFENesin Take 5 mLs by mouth every 4 (four) hours as needed (cough/ congestiom). 1 tsp every 4 hours as needed for cough/congestion   multivitamin tablet Take 1 tablet by mouth daily.   nitroGLYCERIN 0.4 MG SL tablet Commonly known as: NITROSTAT Place 1 tablet (0.4 mg total) under the tongue every 5 (five) minutes as needed for chest pain (may repeat x3).   omeprazole 40 MG capsule Commonly known as: PRILOSEC Take 40 mg by mouth daily before breakfast.   ondansetron 8 MG tablet Commonly known as: ZOFRAN Take 8 mg by mouth every 4 (four) hours as needed for nausea.   OXYGEN Use 4L 24/7   predniSONE 10 MG (21) Tbpk tablet Commonly known as: STERAPRED UNI-PAK 21 TAB Take 6 tablets Day 1, 5 Tablets Day 2, 4 Tablets Day 3, 3 Tablets Day 4, 2 Tablets Day 5, 1 Tablet Day 6 and Stop Day 7   Tiotropium Bromide Monohydrate 2.5 MCG/ACT Aers Commonly known as: Spiriva Respimat Inhale 2 puffs into the lungs daily.   Vitamin D 50 MCG (2000 UT) tablet Take 2,000 Units by mouth every morning.

## 2019-04-23 NOTE — Progress Notes (Signed)
[facility]ology Progress Note  Patricia Wall 69 y.o. 1950/10/06   Subjective: Feels good. Denies abdominal pain or rectal bleeding. Sitting in bedside chair.  Objective: Vital signs: Vitals:   04/22/19 2154 04/23/19 0515  BP:  124/75  Pulse:  95  Resp:  19  Temp: 98.5 F (36.9 C)   SpO2:  98%    Physical Exam: Gen: alert, no acute distress, thin, elderly, pleasant  HEENT: anicteric sclera CV: RRR Chest: CTA B Abd: soft, nontender, nondistended, +BS   Lab Results: Recent Labs    04/22/19 0248 04/23/19 0218  NA 135 139  K 3.7 3.6  CL 89* 95*  CO2 33* 36*  GLUCOSE 209* 144*  BUN 29* 20  CREATININE 0.77 0.74  CALCIUM 8.4* 8.3*  MG 1.9 1.8  PHOS 2.3* 3.9   Recent Labs    04/22/19 0248 04/23/19 0218  AST 18 25  ALT 22 30  ALKPHOS 96 104  BILITOT 0.6 0.6  PROT 5.6* 5.5*  ALBUMIN 2.8* 2.7*   Recent Labs    04/22/19 0248 04/23/19 0218  WBC 10.1 7.4  NEUTROABS 7.4 4.4  HGB 9.9* 10.9*  HCT 31.6* 34.4*  MCV 89.3 89.1  PLT 189 184      Assessment/Plan: Anemia with a negative EGD. S/P heart cath yesterday with coronary stent placement on ASA/Plavix/Eliquis. Outpatient colonoscopy at a time to be determined. Our office will arrange f/u for Dr. Dulce Sellar for 3-4 weeks. Will sign off. Call if questions.   Shirley Friar 04/23/2019, 1:11 PM  Questions please call [phone] ID: Patricia Wall, female   DOB: July 05, 1950, 69 y.o.   MRN: [id number]

## 2019-04-23 NOTE — Progress Notes (Signed)
   Called pt daughter to let her know about f/u appt.  Dtr recorded info and will let pt know.   Theodore Demark, PA-C 04/23/2019 4:15 PM

## 2019-04-23 NOTE — Progress Notes (Signed)
CARDIAC REHAB PHASE I   PRE:  Rate/Rhythm: 98 SR with PACs    BP: sitting 130/78    SaO2: 99 4L  MODE:  Ambulation: 72 ft   POST:  Rate/Rhythm: 131 ST with PACs    BP: sitting 113/66     SaO2: 96 4L  Pt able to walk with RW and 4L. HR up to 131 ST with PACs. No major c/o. To recliner. Discussed stent, Plavix, restrictions, diet, HF management with book, smoking cessation, and CRPII. Voiced understanding.  Might be interested in CRPII and will refer to [place]. She quit smoking right before she came in (1 day) wearing nicotine patch. She wants to stay quit and gave her advice and resources.  [id number]  Harriet Masson CES, ACSM 04/23/2019 10:56 AM

## 2019-04-23 NOTE — Discharge Instructions (Signed)
Femoral Site Care This sheet gives you information about how to care for yourself after your procedure. Your health care provider may also give you more specific instructions. If you have problems or questions, contact your health care provider. What can I expect after the procedure? After the procedure, it is common to have:  Bruising that usually fades within 1-2 weeks.  Tenderness at the site. Follow these instructions at home: Wound care  Follow instructions from your health care provider about how to take care of your insertion site. Make sure you: ? Wash your hands with soap and water before you change your bandage (dressing). If soap and water are not available, use hand sanitizer. ? Change your dressing as told by your health care provider. ? Leave stitches (sutures), skin glue, or adhesive strips in place. These skin closures may need to stay in place for 2 weeks or longer. If adhesive strip edges start to loosen and curl up, you may trim the loose edges. Do not remove adhesive strips completely unless your health care provider tells you to do that.  Do not take baths, swim, or use a hot tub until your health care provider approves.  You may shower 24-48 hours after the procedure or as told by your health care provider. ? Gently wash the site with plain soap and water. ? Pat the area dry with a clean towel. ? Do not rub the site. This may cause bleeding.  Do not apply powder or lotion to the site. Keep the site clean and dry.  Check your femoral site every day for signs of infection. Check for: ? Redness, swelling, or pain. ? Fluid or blood. ? Warmth. ? Pus or a bad smell. Activity  For the first 2-3 days after your procedure, or as long as directed: ? Avoid climbing stairs as much as possible. ? Do not squat.  Do not lift anything that is heavier than 10 lb (4.5 kg), or the limit that you are told, until your health care provider says that it is safe.  Rest as  directed. ? Avoid sitting for a long time without moving. Get up to take short walks every 1-2 hours.  Do not drive for 24 hours if you were given a medicine to help you relax (sedative). General instructions  Take over-the-counter and prescription medicines only as told by your health care provider.  Keep all follow-up visits as told by your health care provider. This is important. Contact a health care provider if you have:  A fever or chills.  You have redness, swelling, or pain around your insertion site. Get help right away if:  The catheter insertion area swells very fast.  You pass out.  You suddenly start to sweat or your skin gets clammy.  The catheter insertion area is bleeding, and the bleeding does not stop when you hold steady pressure on the area.  The area near or just beyond the catheter insertion site becomes pale, cool, tingly, or numb. These symptoms may represent a serious problem that is an emergency. Do not wait to see if the symptoms will go away. Get medical help right away. Call your local emergency services (911 in the [place] Do not drive yourself to the hospital. Summary  After the procedure, it is common to have bruising that usually fades within 1-2 weeks.  Check your femoral site every day for signs of infection.  Do not lift anything that is heavier than 10 lb (4.5 kg), or the   limit that you are told, until your health care provider says that it is safe. This information is not intended to replace advice given to you by your health care provider. Make sure you discuss any questions you have with your health care provider. Document Revised: 03/20/2017 Document Reviewed: 03/20/2017 Elsevier Patient Education  2020 Elsevier Inc.  

## 2019-04-23 NOTE — Progress Notes (Signed)
  Speech Language Pathology Treatment: Dysphagia  Patient Details Name: Patricia Wall MRN: [id number] DOB: [birth year] Today's Date: 04/23/2019 Time: 1208-1220 SLP Time Calculation (min) (ACUTE ONLY): 12 min  Assessment / Plan / Recommendation Clinical Impression  Patient received sitting in chair in room, 4L oxygen delivered via nasal cannula. Patient alert, on telemetry. Oxygen saturation observed to be fluctuating between 91%-100% at rest. Oxygen briefly desaturated to 85% while patient at rest, but went back up to the 90's. Pt seen with sips of thin liquid (water) via self administered straw sips. No overt s/sx aspiration, RR observed to go from 19 to 28 immediately after the swallow, but went back down to the mid 20's approximately 1 minute after the swallow. Patient seen with soft solids (chopped peaches) and regular solids (graham cracker). Pt with adequate oral acceptance, grossly adequate mastication and swallow initiation appeared timely. Oxygen saturation remained 92%-100% during and after the swallow with solids. RR increased from 19 to upper 20's immediately after the swallow. RR went down to 18-20 a few minutes after patient finished swallowing. Patient denied feeling SOB when seen with PO trials during this session.  ST provided education to patient re: eating/drinking when sitting upright, slowing rate, taking small bites/sips and taking frequent breaks while eating. ST encouraged patient to take a break from eating/drinking if she begins to feel SOB as well. Patient verbalized understanding and agreement.    Recommend patient continue regular solids/thin liquids with intermittent supervision to cue for strategies: taking small bites/sips, slow rate of intake, taking breaks.  Patient appropriate for discharge for dysphagia ST services at this time. Please re-consult should new needs arise.   HPI HPI: SHAELEE Wall is a 69 y.o. female with medical history significant of CAD with  multiple stenting, PVD and progressive COPD on home supplemental O2, 4L with ongoing tobacco use who presented with weakness. CXR 1/29: "Increasing basilar interstitial opacities, suspect developing edema"      SLP Plan  Discharge SLP treatment due to (comment)       Recommendations  Diet recommendations: Regular;Thin liquid Medication Administration: Whole meds with liquid Supervision: Patient able to self feed;Intermittent supervision to cue for compensatory strategies Compensations: Slow rate;Small sips/bites Postural Changes and/or Swallow Maneuvers: Seated upright 90 degrees;Upright 30-60 min after meal                Oral Care Recommendations: Oral care BID Follow up Recommendations: None SLP Visit Diagnosis: Dysphagia, unspecified (R13.10) Plan: Discharge SLP treatment due to (comment)       GO               Shella Spearing, M.Ed., CCC-SLP Speech Therapy Acute Rehabilitation [phone]: Acute Rehab office [phone] - pager   [name] 04/23/2019, 12:25 PM

## 2019-04-24 ENCOUNTER — Ambulatory Visit: Payer: Medicare Other | Admitting: Nurse Practitioner

## 2019-04-26 ENCOUNTER — Emergency Department (HOSPITAL_COMMUNITY): Payer: Medicare Other

## 2019-04-26 ENCOUNTER — Observation Stay (HOSPITAL_COMMUNITY)
Admission: EM | Admit: 2019-04-26 | Discharge: 2019-04-27 | Disposition: A | Discharge status: Home / Self Care | Payer: Medicare Other | Attending: Family Medicine | Admitting: Family Medicine

## 2019-04-26 ENCOUNTER — Telehealth: Payer: Self-pay | Admitting: Internal Medicine

## 2019-04-26 ENCOUNTER — Other Ambulatory Visit: Payer: Self-pay

## 2019-04-26 ENCOUNTER — Encounter (HOSPITAL_COMMUNITY): Payer: Self-pay

## 2019-04-26 DIAGNOSIS — F329 Major depressive disorder, single episode, unspecified: Secondary | ICD-10-CM | POA: Diagnosis not present

## 2019-04-26 DIAGNOSIS — I110 Hypertensive heart disease with heart failure: Secondary | ICD-10-CM | POA: Diagnosis not present

## 2019-04-26 DIAGNOSIS — Z7901 Long term (current) use of anticoagulants: Secondary | ICD-10-CM | POA: Insufficient documentation

## 2019-04-26 DIAGNOSIS — N179 Acute kidney failure, unspecified: Secondary | ICD-10-CM | POA: Diagnosis present

## 2019-04-26 DIAGNOSIS — Z87891 Personal history of nicotine dependence: Secondary | ICD-10-CM | POA: Diagnosis not present

## 2019-04-26 DIAGNOSIS — Z7982 Long term (current) use of aspirin: Secondary | ICD-10-CM | POA: Insufficient documentation

## 2019-04-26 DIAGNOSIS — Z7902 Long term (current) use of antithrombotics/antiplatelets: Secondary | ICD-10-CM | POA: Diagnosis not present

## 2019-04-26 DIAGNOSIS — I739 Peripheral vascular disease, unspecified: Secondary | ICD-10-CM | POA: Diagnosis not present

## 2019-04-26 DIAGNOSIS — Z8673 Personal history of transient ischemic attack (TIA), and cerebral infarction without residual deficits: Secondary | ICD-10-CM | POA: Diagnosis not present

## 2019-04-26 DIAGNOSIS — I480 Paroxysmal atrial fibrillation: Secondary | ICD-10-CM | POA: Diagnosis not present

## 2019-04-26 DIAGNOSIS — Z7951 Long term (current) use of inhaled steroids: Secondary | ICD-10-CM | POA: Diagnosis not present

## 2019-04-26 DIAGNOSIS — E118 Type 2 diabetes mellitus with unspecified complications: Secondary | ICD-10-CM | POA: Insufficient documentation

## 2019-04-26 DIAGNOSIS — Z20822 Contact with and (suspected) exposure to covid-19: Secondary | ICD-10-CM | POA: Diagnosis not present

## 2019-04-26 DIAGNOSIS — J449 Chronic obstructive pulmonary disease, unspecified: Secondary | ICD-10-CM | POA: Insufficient documentation

## 2019-04-26 DIAGNOSIS — Z79899 Other long term (current) drug therapy: Secondary | ICD-10-CM | POA: Insufficient documentation

## 2019-04-26 DIAGNOSIS — R55 Syncope and collapse: Principal | ICD-10-CM | POA: Diagnosis present

## 2019-04-26 DIAGNOSIS — I959 Hypotension, unspecified: Secondary | ICD-10-CM | POA: Diagnosis present

## 2019-04-26 DIAGNOSIS — I25119 Atherosclerotic heart disease of native coronary artery with unspecified angina pectoris: Secondary | ICD-10-CM | POA: Insufficient documentation

## 2019-04-26 DIAGNOSIS — I2510 Atherosclerotic heart disease of native coronary artery without angina pectoris: Secondary | ICD-10-CM

## 2019-04-26 DIAGNOSIS — E119 Type 2 diabetes mellitus without complications: Secondary | ICD-10-CM

## 2019-04-26 LAB — URINALYSIS, ROUTINE W REFLEX MICROSCOPIC
Bilirubin Urine: NEGATIVE
Glucose, UA: NEGATIVE mg/dL
Hgb urine dipstick: NEGATIVE
Ketones, ur: NEGATIVE mg/dL
Nitrite: NEGATIVE
Protein, ur: NEGATIVE mg/dL
Specific Gravity, Urine: 1.006 (ref 1.005–1.030)
pH: 6 (ref 5.0–8.0)

## 2019-04-26 LAB — POCT I-STAT EG7
Acid-Base Excess: 8 mmol/L — ABNORMAL HIGH (ref 0.0–2.0)
Bicarbonate: 35.2 mmol/L — ABNORMAL HIGH (ref 20.0–28.0)
Calcium, Ion: 1.18 mmol/L (ref 1.15–1.40)
HCT: 29 % — ABNORMAL LOW (ref 36.0–46.0)
Hemoglobin: 9.9 g/dL — ABNORMAL LOW (ref 12.0–15.0)
O2 Saturation: 99 %
Potassium: 4.4 mmol/L (ref 3.5–5.1)
Sodium: 134 mmol/L — ABNORMAL LOW (ref 135–145)
TCO2: 37 mmol/L — ABNORMAL HIGH (ref 22–32)
pCO2, Ven: 61.8 mmHg — ABNORMAL HIGH (ref 44.0–60.0)
pH, Ven: 7.363 (ref 7.250–7.430)
pO2, Ven: 126 mmHg — ABNORMAL HIGH (ref 32.0–45.0)

## 2019-04-26 LAB — CBC WITH DIFFERENTIAL/PLATELET
Abs Immature Granulocytes: 0 10*3/uL (ref 0.00–0.07)
Basophils Absolute: 0 10*3/uL (ref 0.0–0.1)
Basophils Relative: 0 %
Eosinophils Absolute: 0 10*3/uL (ref 0.0–0.5)
Eosinophils Relative: 0 %
HCT: 32.3 % — ABNORMAL LOW (ref 36.0–46.0)
Hemoglobin: 9.5 g/dL — ABNORMAL LOW (ref 12.0–15.0)
Lymphocytes Relative: 22 %
Lymphs Abs: 2.9 10*3/uL (ref 0.7–4.0)
MCH: 28.2 pg (ref 26.0–34.0)
MCHC: 29.4 g/dL — ABNORMAL LOW (ref 30.0–36.0)
MCV: 95.8 fL (ref 80.0–100.0)
Monocytes Absolute: 1 10*3/uL (ref 0.1–1.0)
Monocytes Relative: 8 %
Neutro Abs: 9.2 10*3/uL — ABNORMAL HIGH (ref 1.7–7.7)
Neutrophils Relative %: 70 %
Platelets: 288 10*3/uL (ref 150–400)
RBC: 3.37 MIL/uL — ABNORMAL LOW (ref 3.87–5.11)
RDW: 20 % — ABNORMAL HIGH (ref 11.5–15.5)
WBC: 13.1 10*3/uL — ABNORMAL HIGH (ref 4.0–10.5)
nRBC: 0 % (ref 0.0–0.2)
nRBC: 0 /100 WBC

## 2019-04-26 LAB — COMPREHENSIVE METABOLIC PANEL
ALT: 26 U/L (ref 0–44)
AST: 18 U/L (ref 15–41)
Albumin: 2.9 g/dL — ABNORMAL LOW (ref 3.5–5.0)
Alkaline Phosphatase: 88 U/L (ref 38–126)
Anion gap: 10 (ref 5–15)
BUN: 44 mg/dL — ABNORMAL HIGH (ref 8–23)
CO2: 30 mmol/L (ref 22–32)
Calcium: 8.9 mg/dL (ref 8.9–10.3)
Chloride: 98 mmol/L (ref 98–111)
Creatinine, Ser: 1.33 mg/dL — ABNORMAL HIGH (ref 0.44–1.00)
GFR calc Af Amer: 47 mL/min — ABNORMAL LOW (ref >60)
GFR calc non Af Amer: 40 mL/min — ABNORMAL LOW (ref >60)
Glucose, Bld: 80 mg/dL (ref 70–99)
Potassium: 4.7 mmol/L (ref 3.5–5.1)
Sodium: 138 mmol/L (ref 135–145)
Total Bilirubin: 0.4 mg/dL (ref 0.3–1.2)
Total Protein: 5.3 g/dL — ABNORMAL LOW (ref 6.5–8.1)

## 2019-04-26 LAB — TROPONIN I (HIGH SENSITIVITY)
Troponin I (High Sensitivity): 15 ng/L (ref <18)
Troponin I (High Sensitivity): 14 ng/L (ref <18)

## 2019-04-26 LAB — BRAIN NATRIURETIC PEPTIDE: B Natriuretic Peptide: 110.7 pg/mL — ABNORMAL HIGH (ref 0.0–100.0)

## 2019-04-26 LAB — CBG MONITORING, ED: Glucose-Capillary: 77 mg/dL (ref 70–99)

## 2019-04-26 LAB — GLUCOSE, CAPILLARY: Glucose-Capillary: 111 mg/dL — ABNORMAL HIGH (ref 70–99)

## 2019-04-26 LAB — TSH: TSH: 2.564 u[IU]/mL (ref 0.350–4.500)

## 2019-04-26 LAB — MAGNESIUM: Magnesium: 1.6 mg/dL — ABNORMAL LOW (ref 1.7–2.4)

## 2019-04-26 MED ORDER — LACTATED RINGERS IV BOLUS
1000.0000 mL | Freq: Once | INTRAVENOUS | Status: AC
  Administered 2019-04-26: 18:00:00 1000 mL via INTRAVENOUS

## 2019-04-26 MED ORDER — MAGNESIUM SULFATE 2 GM/50ML IV SOLN
2.0000 g | Freq: Once | INTRAVENOUS | Status: AC
  Administered 2019-04-26: 20:00:00 2 g via INTRAVENOUS
  Filled 2019-04-26: qty 50

## 2019-04-26 NOTE — Progress Notes (Signed)
Patient arrived to unit.  Alert and oriented.  Placed on telemetry.  VSS.  Patient belongings included paperwork, clothing, and upper dentures.  Called placed to patient daughter, Leeroy Bock with update and room number.

## 2019-04-26 NOTE — ED Notes (Signed)
[phone] POA [name], pts daughter wants an update

## 2019-04-26 NOTE — ED Notes (Signed)
Paged [facility] to Dr Durene Cal

## 2019-04-26 NOTE — ED Provider Notes (Signed)
[facility] EMERGENCY DEPARTMENT Provider Note   CSN: [id number] Arrival date & time: 04/26/19  1805     History No chief complaint on file.   Patricia Wall is a 69 y.o. female.  HPI 69 year old female with history of CAD, with recent stent to the LAD approximately 5 days ago, CHF, COPD, polysubstance use, alcohol abuse, presenting to the emergency department for syncopal episode as well as altered mental status.  Per report via EMS patient sat up from a chair, syncopized, had some myoclonic jerking, and then has been sleepy.  No drug use per family, no fevers or chills recently, no chest pain.  Patient herself is oriented x3, somewhat drowsy on arrival.  Denies any chest pain or shortness of breath, no palpitations, denies any leg swelling, leg redness or pain, no recent vomiting or nausea, has been taking her medications as prescribed, no melena or hematochezia, currently on blood thinning medication for her A. fib.    Past Medical History:  Diagnosis Date  . Abnormal chest xray   . Alcohol abuse   . Back pain   . CAD (coronary artery disease)     Prev seen by [facility].  Stent to OM and LAD Last cath 6/11 cathed on June 13, [name]. 2011.  The cardiac catheterization showed 30% and 50% lesions in the mid  and distal LAD, 50% in the OM1, 40% in the OM2 with 20% in-stent  restenosis, 40% in-stent restenosis in the circumflex and no significant  disease in the RCA.  Her EF was normal.  Dr. Olevia Perches recommended a trial  of proton pump inhibitors   . Chest pain   . COPD (chronic obstructive pulmonary disease) ([place])   . Cyanosis   . DDD (degenerative disc disease)   . Depression   . Diarrhea   . Diverticular disease   . GERD (gastroesophageal reflux disease)   . H/O: hysterectomy   . Hemorrhoids   . HTN (hypertension)   . IBS (irritable bowel syndrome)   . Insomnia   . Lymphadenitis   . Pharyngitis   . PUD (peptic ulcer disease)   . PVD (peripheral vascular disease)  ([place])   . Sinusitis   . TIA (transient ischemic attack)   . Tobacco abuse   . Vertigo     Patient Active Problem List   Diagnosis Date Noted  . Pressure injury of skin 04/23/2019  . Acute on chronic respiratory failure with hypercapnia ([place])   . SOB (shortness of breath)   . Acute diastolic CHF (congestive heart failure) ([place])   . Exertional angina (HCC)   . Atrial fibrillation with RVR ([place])   . Anemia 04/18/2019  . Thrombocytopenia ([place]) 04/18/2019  . AF (paroxysmal atrial fibrillation) ([place]) 04/18/2019  . Chronic respiratory failure with hypoxia and hypercapnia ([place]) 03/29/2018  . Acute respiratory failure with hypoxia ([place]) 01/07/2015  . CAD (coronary artery disease) 01/07/2015  . Obesity 11/01/2014  . COPD exacerbation ([place]) 12/30/2013  . Chest pain 12/03/2013  . Atherosclerotic heart disease of native coronary artery with angina pectoris ([place]) 12/03/2013    Class: Diagnosis of  . Syncope 12/02/2013  . Dyspnea 10/02/2013  . Acute upper respiratory infections of unspecified site 04/03/2013  . Right carotid bruit 11/15/2012  . Peripheral vascular disease ([place]) 09/30/2009  . Nonspecific (abnormal) findings on radiological and other examination of body structure 09/02/2009  . ABNORMAL CHEST XRAY 09/02/2009  . TINNITUS 04/15/2008  . COUGH 04/15/2008  . VERTIGO 02/04/2008  . PHARYNGITIS  07/18/2007  . LYMPHADENITIS, CERVICAL 07/18/2007  . Angina, class III ([place]) 03/29/2007  . CONDYLOMA ACUMINATUM 03/01/2007  . SINUSITIS, ACUTE 03/01/2007  . INSOMNIA 01/30/2007  . Elevated lipids 09/04/2006  . ABUSE, ALCOHOL, UNSPECIFIED 09/04/2006  . Cigarette smoker 09/04/2006  . DEPRESSION 09/04/2006  . Essential hypertension 09/04/2006  . CAD S/P percutaneous coronary angioplasty: Prior Cypher DES to pLAD, pOM2 & AVG Cx (after Om2); New DES to AVG Cx ISR & Angiosculpt PTCA of Ostial AVG Cx from OM2. 09/04/2006  . HEMORRHOIDS 09/04/2006  . COPD GOLD II / still smoking  09/04/2006  .  GERD 09/04/2006  . PEPTIC ULCER DISEASE 09/04/2006  . DIVERTICULOSIS, COLON 09/04/2006  . Irritable bowel syndrome 09/04/2006  . DEGENERATIVE DISC DISEASE, CERVICAL SPINE 09/04/2006  . LOW BACK PAIN 09/04/2006  . HEADACHE 09/04/2006  . History of cardiovascular disorder 09/04/2006  . ONYCHOMYCOSIS, TOENAILS 09/01/2006  . DIARRHEA 02/18/2006    Past Surgical History:  Procedure Laterality Date  . ABDOMINAL HYSTERECTOMY    . APPENDECTOMY    . BREAST LUMPECTOMY Left 2015  . CARDIAC CATHETERIZATION  2005, 2006, 2009,2011  . CHOLECYSTECTOMY    . CORONARY STENT INTERVENTION N/A 04/22/2019   Procedure: CORONARY STENT INTERVENTION;  Surgeon: Troy Sine, MD;  Location: [facility];  Service: Cardiovascular;  Laterality: N/A;  . CORONARY STENT PLACEMENT     Status post balloon angiogram plasty and Cypher stent to the distal    circumflex and OM2 branch  . ESOPHAGOGASTRODUODENOSCOPY (EGD) WITH PROPOFOL Left 04/21/2019   Procedure: ESOPHAGOGASTRODUODENOSCOPY (EGD) WITH PROPOFOL;  Surgeon: Arta Silence, MD;  Location: [facility];  Service: Endoscopy;  Laterality: Left;  . Hammertoe repair    . Left Bunionectomy    . LEFT HEART CATH AND CORONARY ANGIOGRAPHY N/A 04/22/2019   Procedure: LEFT HEART CATH AND CORONARY ANGIOGRAPHY;  Surgeon: Troy Sine, MD;  Location: [facility];  Service: Cardiovascular;  Laterality: N/A;  . LEFT HEART CATHETERIZATION WITH CORONARY ANGIOGRAM N/A 12/03/2013   Procedure: LEFT HEART CATHETERIZATION WITH CORONARY ANGIOGRAM;  Surgeon: Leonie Man, MD;  Location: [facility] CATH LAB;  Service: Cardiovascular;  Laterality: N/A;  . TUBAL LIGATION       OB History   No obstetric history on file.     Family History  Problem Relation Age of Onset  . Heart disease Mother   . Rheumatologic disease Mother   . Cancer - Other Mother        Uterine  . Emphysema Father   . Heart disease Father   . Cancer - Other Father   . Coronary artery disease Other    . Asthma Sister        2 sisters  . Stroke Sister     Social History   Tobacco Use  . Smoking status: Former Research scientist (life sciences)  . Smokeless tobacco: Never Used  Substance Use Topics  . Alcohol use: No    Alcohol/week: 0.0 standard drinks  . Drug use: Yes    Comment: Seldom Marijuana use- 1Xmonth    Home Medications Prior to Admission medications   Medication Sig Start Date End Date Taking? Authorizing Provider  acetaminophen (TYLENOL) 325 MG tablet Take 2 tablets (650 mg total) by mouth every 4 (four) hours as needed for headache or mild pain. 04/23/19  Yes Sheikh, Omair Latif, DO  acidophilus (RISAQUAD) CAPS capsule Take 1 capsule by mouth 2 (two) times daily. 04/09/19  Yes [provider]  albuterol (PROVENTIL) (2.5 MG/3ML) 0.083% nebulizer solution  USE 1 VIAL IN NEBULIZER EVERY 4 HOURS AS NEEDED FOR WHEEZING OR SHORTNESS OF BREATH. Patient taking differently: Take 2.5 mg by nebulization every 4 (four) hours as needed for wheezing or shortness of breath. USE 1 VIAL IN NEBULIZER EVERY 4 HOURS AS NEEDED FOR WHEEZING OR SHORTNESS OF BREATH. 07/14/17  Yes Tanda Rockers, MD  albuterol (VENTOLIN HFA) 108 (90 Base) MCG/ACT inhaler Inhale 2 puffs into the lungs every 6 (six) hours as needed for wheezing or shortness of breath. 07/13/17  Yes Tanda Rockers, MD  apixaban (ELIQUIS) 5 MG TABS tablet Take 1 tablet (5 mg total) by mouth 2 (two) times daily. 04/23/19  Yes Sheikh, Omair Latif, DO  aspirin 81 MG chewable tablet Chew 1 tablet (81 mg total) by mouth daily. 04/24/19  Yes Sheikh, Omair Latif, DO  atorvastatin (LIPITOR) 80 MG tablet Take 80 mg by mouth daily.   Yes [provider]  blood glucose meter kit and supplies Dispense based on patient and insurance preference. Use up to four times daily as directed. (FOR ICD-9 250.00, 250.01). 01/13/15  Yes Reyne Dumas, MD  budesonide-formoterol (SYMBICORT) 160-4.5 MCG/ACT inhaler Inhale 2 puffs into the lungs 2 (two) times daily. 10/26/16  Yes  Parrett, Tammy S, NP  butalbital-acetaminophen-caffeine (FIORICET, ESGIC) 50-325-40 MG tablet Take 1-1.5 tablets by mouth every 8 (eight) hours as needed for headache or migraine. 1 - 1 and 1/2 every 8 hours as needed for headache    Yes [provider]  calcium carbonate (OS-CAL) 600 MG TABS Take 600 mg by mouth every morning.    Yes [provider]  Cholecalciferol (VITAMIN D) 2000 UNITS tablet Take 2,000 Units by mouth every morning.    Yes [provider]  clopidogrel (PLAVIX) 75 MG tablet Take 1 tablet (75 mg total) by mouth daily with breakfast. 04/24/19  Yes Sheikh, Omair Latif, DO  Dextromethorphan-Guaifenesin ([place] FAST-MAX DM MAX) 5-100 MG/5ML LIQD Take 5 mLs by mouth every 4 (four) hours as needed (cough/ congestiom). 1 tsp every 4 hours as needed for cough/congestion    Yes [provider]  diltiazem (CARDIZEM CD) 120 MG 24 hr capsule Take 120 mg by mouth daily.  04/05/19  Yes [provider]  donepezil (ARICEPT) 5 MG tablet Take 5 mg by mouth daily.  01/10/18  Yes [provider]  DULoxetine (CYMBALTA) 60 MG capsule Take 60 mg by mouth every morning.    Yes [provider]  furosemide (LASIX) 40 MG tablet Take 1 tablet by mouth 2 (two) times daily. 04/07/19  Yes [provider]  gabapentin (NEURONTIN) 300 MG capsule Take 300 mg by mouth 3 (three) times daily.    Yes [provider]  Lancets (FREESTYLE) lancets Use as instructed 01/13/15  Yes Reyne Dumas, MD  letrozole (FEMARA) 2.5 MG tablet Take 2.5 mg by mouth every morning.    Yes [provider]  losartan (COZAAR) 25 MG tablet Take 25 mg by mouth daily.  04/05/19  Yes [provider]  metFORMIN (GLUCOPHAGE) 1000 MG tablet Take 1,000 mg by mouth 2 (two) times daily with a meal.   Yes [provider]  methocarbamol (ROBAXIN) 750 MG tablet Take 750 mg by mouth every 8 (eight) hours as needed for muscle spasms.   Yes [provider]  metoprolol succinate (TOPROL-XL) 25 MG 24 hr tablet Take 25 mg by mouth daily.  04/05/19  Yes [provider]  Multiple Vitamin (MULTIVITAMIN) tablet Take 1 tablet by mouth daily.  Yes [provider]  nitroGLYCERIN (NITROSTAT) 0.4 MG SL tablet Place 1 tablet (0.4 mg total) under the tongue every 5 (five) minutes as needed for chest pain (may repeat x3). 02/02/18  Yes Josue Hector, MD  omeprazole (PRILOSEC) 40 MG capsule Take 40 mg by mouth daily before breakfast.    Yes [provider]  ondansetron (ZOFRAN) 8 MG tablet Take 8 mg by mouth every 4 (four) hours as needed for nausea.    Yes [provider]  OXYGEN Use 4L 24/7   Yes [provider]  potassium chloride SA (KLOR-CON M20) 20 MEQ tablet Take 20 mEq by mouth 2 (two) times daily.  04/05/19  Yes [provider]  predniSONE (STERAPRED UNI-PAK 21 TAB) 10 MG (21) TBPK tablet Take 6 tablets Day 1, 5 Tablets Day 2, 4 Tablets Day 3, 3 Tablets Day 4, 2 Tablets Day 5, 1 Tablet Day 6 and Stop Day 7 Patient taking differently: Take 10 mg by mouth See admin instructions. Take 6 tablets Day 1, 5 Tablets Day 2, 4 Tablets Day 3, 3 Tablets Day 4, 2 Tablets Day 5, 1 Tablet Day 6 and Stop Day 7 04/23/19  Yes Sheikh, Omair Latif, DO  Tiotropium Bromide Monohydrate (SPIRIVA RESPIMAT) 2.5 MCG/ACT AERS Inhale 2 puffs into the lungs daily. 10/26/16  Yes Parrett, Tammy S, NP  Erenumab-aooe (AIMOVIG [place]) Inject 1 Dose into the skin as directed.     [provider]  Respiratory Therapy Supplies (FLUTTER) DEVI Use as directed 07/13/17   Tanda Rockers, MD    Allergies    Patient has no known allergies.  Review of Systems   Review of Systems  Unable to perform ROS: Mental status change    Physical Exam Updated Vital Signs BP (!) 134/123   Pulse (!) 58   Temp 97.7 F (36.5 C) (Oral)   Resp 12   Ht 5' 6"  (1.676 m)   Wt 62.6 kg   SpO2 100%   BMI 22.27 kg/m   Physical  Exam Vitals and nursing note reviewed.  Constitutional:      General: She is not in acute distress.    Appearance: She is well-developed. She is ill-appearing.     Comments: Drowsy, elderly, frail, fragile  HENT:     Head: Normocephalic and atraumatic.     Right Ear: External ear normal.     Left Ear: External ear normal.     Nose: Nose normal. No congestion.     Mouth/Throat:     Mouth: Mucous membranes are dry.     Pharynx: Oropharynx is clear.  Eyes:     Conjunctiva/sclera: Conjunctivae normal.  Cardiovascular:     Rate and Rhythm: Normal rate and regular rhythm.     Heart sounds: No murmur.  Pulmonary:     Effort: Pulmonary effort is normal. No respiratory distress.     Breath sounds: Normal breath sounds. No stridor. No wheezing or rhonchi.  Abdominal:     Palpations: Abdomen is soft.     Tenderness: There is no abdominal tenderness.  Musculoskeletal:        General: No swelling, tenderness, deformity or signs of injury.     Cervical back: Neck supple.  Skin:    General: Skin is warm and dry.     Capillary Refill: Capillary refill takes less than 2 seconds.     Coloration: Skin is not jaundiced or pale.     Findings: No bruising or erythema.  Neurological:  General: No focal deficit present.     Mental Status: She is oriented to person, place, and time.     Cranial Nerves: No cranial nerve deficit.     Sensory: No sensory deficit.     Motor: No weakness.     Coordination: Coordination normal.     Gait: Gait normal.     Deep Tendon Reflexes: Reflexes normal.  Psychiatric:        Mood and Affect: Mood normal.        Behavior: Behavior normal.     ED Results / Procedures / Treatments   Labs (all labs ordered are listed, but only abnormal results are displayed) Labs Reviewed  CBC WITH DIFFERENTIAL/PLATELET - Abnormal; Notable for the following components:      Result Value   WBC 13.1 (*)    RBC 3.37 (*)    Hemoglobin 9.5 (*)    HCT 32.3 (*)    MCHC 29.4  (*)    RDW 20.0 (*)    Neutro Abs 9.2 (*)    All other components within normal limits  COMPREHENSIVE METABOLIC PANEL - Abnormal; Notable for the following components:   BUN 44 (*)    Creatinine, Ser 1.33 (*)    Total Protein 5.3 (*)    Albumin 2.9 (*)    GFR calc non Af Amer 40 (*)    GFR calc Af Amer 47 (*)    All other components within normal limits  BRAIN NATRIURETIC PEPTIDE - Abnormal; Notable for the following components:   B Natriuretic Peptide 110.7 (*)    All other components within normal limits  MAGNESIUM - Abnormal; Notable for the following components:   Magnesium 1.6 (*)    All other components within normal limits  POCT I-STAT EG7 - Abnormal; Notable for the following components:   pCO2, Ven 61.8 (*)    pO2, Ven 126.0 (*)    Bicarbonate 35.2 (*)    TCO2 37 (*)    Acid-Base Excess 8.0 (*)    Sodium 134 (*)    HCT 29.0 (*)    Hemoglobin 9.9 (*)    All other components within normal limits  SARS CORONAVIRUS 2 (TAT 6-24 HRS)  TSH  URINALYSIS, ROUTINE W REFLEX MICROSCOPIC  RAPID URINE DRUG SCREEN, HOSP PERFORMED  CBG MONITORING, ED  I-STAT VENOUS BLOOD GAS, ED  TROPONIN I (HIGH SENSITIVITY)  TROPONIN I (HIGH SENSITIVITY)    EKG EKG Interpretation  Date/Time:  Friday April 26 2019 18:13:08 EST Ventricular Rate:  64 PR Interval:    QRS Duration: 102 QT Interval:  413 QTC Calculation: 427 R Axis:   92 Text Interpretation: Sinus rhythm Borderline short PR interval Right axis deviation No acute changes No significant change since last tracing Confirmed by Varney Biles [phone]) on 04/26/2019 6:32:08 PM   Radiology CT Head Wo Contrast  Result Date: 04/26/2019 CLINICAL DATA:  Syncope EXAM: CT HEAD WITHOUT CONTRAST TECHNIQUE: Contiguous axial images were obtained from the base of the skull through the vertex without intravenous contrast. COMPARISON:  None. FINDINGS: Brain: No evidence of acute infarction, hemorrhage, hydrocephalus, extra-axial collection or  mass lesion/mass effect. Mild periventricular white matter hypodensity. Vascular: No hyperdense vessel or unexpected calcification. Skull: Normal. Negative for fracture or focal lesion. Sinuses/Orbits: No acute finding. Other: None. IMPRESSION: No acute intracranial pathology.  Small-vessel white matter disease. Electronically Signed   By: Eddie Candle M.D.   On: 04/26/2019 20:47   DG Chest Portable 1 View  Result Date: 04/26/2019 CLINICAL  DATA:  Syncope. EXAM: PORTABLE CHEST 1 VIEW COMPARISON:  April 23, 2019 FINDINGS: Mild, chronic appearing increased interstitial lung markings are seen. There is no evidence of acute infiltrate, pleural effusion or pneumothorax. The cardiac silhouette is borderline in size. There is moderate severity calcification of the aortic arch. The visualized skeletal structures are unremarkable. IMPRESSION: Chronic appearing increased interstitial lung markings without evidence of acute or active cardiopulmonary disease. Electronically Signed   By: Virgina Norfolk M.D.   On: 04/26/2019 19:51    Procedures Procedures (including critical care time)  Medications Ordered in ED Medications  lactated ringers bolus 1,000 mL (0 mLs Intravenous Stopped 04/26/19 2055)  magnesium sulfate IVPB 2 g 50 mL (0 g Intravenous Stopped 04/26/19 2055)    ED Course  I have reviewed the triage vital signs and the nursing notes.  Pertinent labs & imaging results that were available during my care of the patient were reviewed by me and considered in my medical decision making (see chart for details).    MDM Rules/Calculators/A&P                      69 year old female with history stated above presenting to the ED for syncope.  On arrival patient was ill-appearing, hypotensive in the upper 82U systolic.  Afebrile, nontachycardic, satting well on room air.  Patient was given fluid rehydration with improvement in her blood pressures, patient's magnesium is low, has an AKI, likely fluid  depleted from probably overdiuresis from her home Lasix.  Patient has a very significant cardiac history, initial EKG with no signs of ischemic changes or arrhythmias, troponin normal.  Doubt sepsis given patient afebrile and otherwise responding well to fluid rehydration, no infectious type symptoms, no cough, no dysuria, no abdominal pain currently, soft and benign abdomen.  Doubt PE, patient not tachypneic or tachycardic, no hypoxia or shortness of breath noted.  Doubt cardiac etiology however given her history it is worrisome.  Likely orthostatic given her history as well as response to fluid resuscitation.  Patient will be admitted to the hospital for further telemetry, fluid resuscitation and continued medication titration.  Admitted in stable condition.  The attending physician was present and available for all medical decision making and procedures related to this patient's care.  Final Clinical Impression(s) / ED Diagnoses Final diagnoses:  Syncope, unspecified syncope type    Rx / DC Orders ED Discharge Orders    None       Kizzie Fantasia, MD 04/26/19 2118    Varney Biles, MD 04/27/19 [phone]

## 2019-04-26 NOTE — ED Triage Notes (Signed)
Pt from home via ems; witnessed syncopal episode per family; pt got up to walk from chair, went unresponsive, shaking; pt has no complaints; doesn't remember passing out; did not hit head, guided into chair by family; hypotensive on ems arrival, 90/40 manual; a and o x 4; pt got up on own, ems noted seizure like activity, full body convulsions, focal, lasted approx 10 seconds; no hx seizures; cardiac stent placed on february first; hx CHF, fibromyalgia; endorses marijuana use , last smoked this am  90 systolic, after 800 ns 110/54 HR 60 regular 98% 4L (wears all the time) RR 18 CBG 82 T 98.3 f

## 2019-04-26 NOTE — Telephone Encounter (Signed)
Attempted to call pt's daughter Leeroy Bock but unable to reach and unable to leave a VM as VM box has not been set up yet. Will try to call back later.

## 2019-04-26 NOTE — ED Notes (Signed)
Pt transported to CT ?

## 2019-04-27 ENCOUNTER — Observation Stay (HOSPITAL_COMMUNITY): Payer: Medicare Other

## 2019-04-27 ENCOUNTER — Encounter (HOSPITAL_COMMUNITY): Payer: Self-pay | Admitting: Internal Medicine

## 2019-04-27 DIAGNOSIS — N179 Acute kidney failure, unspecified: Secondary | ICD-10-CM

## 2019-04-27 DIAGNOSIS — E119 Type 2 diabetes mellitus without complications: Secondary | ICD-10-CM | POA: Diagnosis not present

## 2019-04-27 DIAGNOSIS — R55 Syncope and collapse: Secondary | ICD-10-CM | POA: Diagnosis not present

## 2019-04-27 DIAGNOSIS — I959 Hypotension, unspecified: Secondary | ICD-10-CM | POA: Diagnosis present

## 2019-04-27 HISTORY — DX: Acute kidney failure, unspecified: N17.9

## 2019-04-27 LAB — CBC WITH DIFFERENTIAL/PLATELET
Abs Immature Granulocytes: 0.52 10*3/uL — ABNORMAL HIGH (ref 0.00–0.07)
Basophils Absolute: 0.1 10*3/uL (ref 0.0–0.1)
Basophils Relative: 1 %
Eosinophils Absolute: 0.1 10*3/uL (ref 0.0–0.5)
Eosinophils Relative: 1 %
HCT: 32.7 % — ABNORMAL LOW (ref 36.0–46.0)
Hemoglobin: 10.1 g/dL — ABNORMAL LOW (ref 12.0–15.0)
Immature Granulocytes: 4 %
Lymphocytes Relative: 29 %
Lymphs Abs: 3.5 10*3/uL (ref 0.7–4.0)
MCH: 28.8 pg (ref 26.0–34.0)
MCHC: 30.9 g/dL (ref 30.0–36.0)
MCV: 93.2 fL (ref 80.0–100.0)
Monocytes Absolute: 1.2 10*3/uL — ABNORMAL HIGH (ref 0.1–1.0)
Monocytes Relative: 10 %
Neutro Abs: 6.5 10*3/uL (ref 1.7–7.7)
Neutrophils Relative %: 55 %
Platelets: 293 10*3/uL (ref 150–400)
RBC: 3.51 MIL/uL — ABNORMAL LOW (ref 3.87–5.11)
RDW: 20.2 % — ABNORMAL HIGH (ref 11.5–15.5)
WBC: 12 10*3/uL — ABNORMAL HIGH (ref 4.0–10.5)
nRBC: 0 % (ref 0.0–0.2)

## 2019-04-27 LAB — COMPREHENSIVE METABOLIC PANEL
ALT: 28 U/L (ref 0–44)
AST: 21 U/L (ref 15–41)
Albumin: 2.9 g/dL — ABNORMAL LOW (ref 3.5–5.0)
Alkaline Phosphatase: 87 U/L (ref 38–126)
Anion gap: 9 (ref 5–15)
BUN: 40 mg/dL — ABNORMAL HIGH (ref 8–23)
CO2: 37 mmol/L — ABNORMAL HIGH (ref 22–32)
Calcium: 9.3 mg/dL (ref 8.9–10.3)
Chloride: 91 mmol/L — ABNORMAL LOW (ref 98–111)
Creatinine, Ser: 1.17 mg/dL — ABNORMAL HIGH (ref 0.44–1.00)
GFR calc Af Amer: 55 mL/min — ABNORMAL LOW (ref >60)
GFR calc non Af Amer: 47 mL/min — ABNORMAL LOW (ref >60)
Glucose, Bld: 141 mg/dL — ABNORMAL HIGH (ref 70–99)
Potassium: 4.6 mmol/L (ref 3.5–5.1)
Sodium: 137 mmol/L (ref 135–145)
Total Bilirubin: 0.6 mg/dL (ref 0.3–1.2)
Total Protein: 5.5 g/dL — ABNORMAL LOW (ref 6.5–8.1)

## 2019-04-27 LAB — SARS CORONAVIRUS 2 (TAT 6-24 HRS): SARS Coronavirus 2: NEGATIVE

## 2019-04-27 LAB — GLUCOSE, CAPILLARY: Glucose-Capillary: 88 mg/dL (ref 70–99)

## 2019-04-27 LAB — TROPONIN I (HIGH SENSITIVITY): Troponin I (High Sensitivity): 14 ng/L (ref <18)

## 2019-04-27 LAB — MAGNESIUM: Magnesium: 2.1 mg/dL (ref 1.7–2.4)

## 2019-04-27 LAB — CORTISOL: Cortisol, Plasma: 3.4 ug/dL

## 2019-04-27 MED ORDER — MAGNESIUM SULFATE 2 GM/50ML IV SOLN
2.0000 g | Freq: Once | INTRAVENOUS | Status: DC
  Filled 2019-04-27: qty 50

## 2019-04-27 MED ORDER — ATORVASTATIN CALCIUM 80 MG PO TABS
80.0000 mg | ORAL_TABLET | Freq: Every day | ORAL | Status: DC
  Administered 2019-04-27: 19:00:00 80 mg via ORAL
  Filled 2019-04-27 (×2): qty 1

## 2019-04-27 MED ORDER — ONDANSETRON HCL 4 MG/2ML IJ SOLN: 4.0000 mg | Freq: Four times a day (QID) | INTRAMUSCULAR | Status: DC | PRN

## 2019-04-27 MED ORDER — MOMETASONE FURO-FORMOTEROL FUM 200-5 MCG/ACT IN AERO
2.0000 | INHALATION_SPRAY | Freq: Two times a day (BID) | RESPIRATORY_TRACT | Status: DC
  Administered 2019-04-27: 08:00:00 2 via RESPIRATORY_TRACT
  Filled 2019-04-27: qty 8.8

## 2019-04-27 MED ORDER — ACETAMINOPHEN 325 MG PO TABS: 650.0000 mg | ORAL_TABLET | Freq: Four times a day (QID) | ORAL | Status: DC | PRN

## 2019-04-27 MED ORDER — ACETAMINOPHEN 650 MG RE SUPP: 650.0000 mg | Freq: Four times a day (QID) | RECTAL | Status: DC | PRN

## 2019-04-27 MED ORDER — MOMETASONE FURO-FORMOTEROL FUM 200-5 MCG/ACT IN AERO
2.0000 | INHALATION_SPRAY | Freq: Two times a day (BID) | RESPIRATORY_TRACT | Status: DC
  Filled 2019-04-27: qty 8.8

## 2019-04-27 MED ORDER — FUROSEMIDE 40 MG PO TABS: 40.0000 mg | ORAL_TABLET | Freq: Every day | ORAL | 0 refills | Status: DC

## 2019-04-27 MED ORDER — LETROZOLE 2.5 MG PO TABS
2.5000 mg | ORAL_TABLET | Freq: Every day | ORAL | Status: DC
  Administered 2019-04-27: 11:00:00 2.5 mg via ORAL
  Filled 2019-04-27: qty 1

## 2019-04-27 MED ORDER — APIXABAN 5 MG PO TABS
5.0000 mg | ORAL_TABLET | Freq: Two times a day (BID) | ORAL | Status: DC
  Administered 2019-04-27: 12:00:00 5 mg via ORAL
  Filled 2019-04-27: qty 1

## 2019-04-27 MED ORDER — UMECLIDINIUM BROMIDE 62.5 MCG/INH IN AEPB
1.0000 | INHALATION_SPRAY | Freq: Every day | RESPIRATORY_TRACT | Status: DC
  Administered 2019-04-27: 1 via RESPIRATORY_TRACT
  Filled 2019-04-27: qty 7

## 2019-04-27 MED ORDER — METHOCARBAMOL 500 MG PO TABS
750.0000 mg | ORAL_TABLET | Freq: Three times a day (TID) | ORAL | Status: DC | PRN
  Administered 2019-04-27: 19:00:00 750 mg via ORAL
  Filled 2019-04-27: qty 2

## 2019-04-27 MED ORDER — TIOTROPIUM BROMIDE MONOHYDRATE 2.5 MCG/ACT IN AERS: 2.0000 | INHALATION_SPRAY | Freq: Every day | RESPIRATORY_TRACT | Status: DC

## 2019-04-27 MED ORDER — CLOPIDOGREL BISULFATE 75 MG PO TABS
75.0000 mg | ORAL_TABLET | Freq: Every day | ORAL | Status: DC
  Administered 2019-04-27: 75 mg via ORAL
  Filled 2019-04-27: qty 1

## 2019-04-27 MED ORDER — DONEPEZIL HCL 5 MG PO TABS
5.0000 mg | ORAL_TABLET | Freq: Every day | ORAL | Status: DC
  Administered 2019-04-27: 12:00:00 5 mg via ORAL
  Filled 2019-04-27: qty 1

## 2019-04-27 MED ORDER — ALBUTEROL SULFATE (2.5 MG/3ML) 0.083% IN NEBU: 2.5000 mg | INHALATION_SOLUTION | Freq: Four times a day (QID) | RESPIRATORY_TRACT | Status: DC | PRN

## 2019-04-27 MED ORDER — INSULIN ASPART 100 UNIT/ML ~~LOC~~ SOLN: 0.0000 [IU] | Freq: Three times a day (TID) | SUBCUTANEOUS | Status: DC

## 2019-04-27 MED ORDER — ONDANSETRON HCL 4 MG PO TABS: 4.0000 mg | ORAL_TABLET | Freq: Four times a day (QID) | ORAL | Status: DC | PRN

## 2019-04-27 MED ORDER — GABAPENTIN 300 MG PO CAPS
300.0000 mg | ORAL_CAPSULE | Freq: Three times a day (TID) | ORAL | Status: DC
  Administered 2019-04-27 (×2): 300 mg via ORAL
  Filled 2019-04-27 (×2): qty 1

## 2019-04-27 MED ORDER — ASPIRIN 81 MG PO CHEW
81.0000 mg | CHEWABLE_TABLET | Freq: Every day | ORAL | Status: DC
  Administered 2019-04-27: 12:00:00 81 mg via ORAL
  Filled 2019-04-27: qty 1

## 2019-04-27 MED ORDER — DULOXETINE HCL 60 MG PO CPEP
60.0000 mg | ORAL_CAPSULE | Freq: Every day | ORAL | Status: DC
  Administered 2019-04-27: 12:00:00 60 mg via ORAL
  Filled 2019-04-27: qty 1

## 2019-04-27 NOTE — Progress Notes (Signed)
Discharge instructions reviewed with daughter, Leeroy Bock, and patient.  These included, but were not limited to, the following:  Medications, follow-up appointments, when to call the MD, when to call 911, pain management, dietary recommendations, etc.

## 2019-04-27 NOTE — Progress Notes (Signed)
Patient with IV magnesium ordered.  Patient received IV mag in ED and repeat mag 2.1.  MD messaged and gave verbal order to discontinue the additional mag ordered.

## 2019-04-27 NOTE — Discharge Instructions (Signed)
     Syncope Syncope is when you pass out (faint) for a short time. It is caused by a sudden decrease in blood flow to the brain. Signs that you may be about to pass out include:  Feeling dizzy or light-headed.  Feeling sick to your stomach (nauseous).  Seeing all white or all black.  Having cold, clammy skin. If you pass out, get help right away. Call your local emergency services (911 in the [place] Do not drive yourself to the hospital. Follow these instructions at home: Watch for any changes in your symptoms. Take these actions to stay safe and help with your symptoms: Lifestyle  Do not drive, use machinery, or play sports until your doctor says it is okay.  Do not drink alcohol.  Do not use any products that contain nicotine or tobacco, such as cigarettes and e-cigarettes. If you need help quitting, ask your doctor.  Drink enough fluid to keep your pee (urine) pale yellow. General instructions  Take over-the-counter and prescription medicines only as told by your doctor.  If you are taking blood pressure or heart medicine, sit up and stand up slowly. Spend a few minutes getting ready to sit and then stand. This can help you feel less dizzy.  Have someone stay with you until you feel stable.  If you start to feel like you might pass out, lie down right away and raise (elevate) your feet above the level of your heart. Breathe deeply and steadily. Wait until all of the symptoms are gone.  Keep all follow-up visits as told by your doctor. This is important. Get help right away if:  You have a very bad headache.  You pass out once or more than once.  You have pain in your chest, belly, or back.  You have a very fast or uneven heartbeat (palpitations).  It hurts to breathe.  You are bleeding from your mouth or your bottom (rectum).  You have black or tarry poop (stool).  You have jerky movements that you cannot control (seizure).  You are confused.  You have  trouble walking.  You are very weak.  You have vision problems. These symptoms may be an emergency. Do not wait to see if the symptoms will go away. Get medical help right away. Call your local emergency services (911 in the [place] Do not drive yourself to the hospital. Summary  Syncope is when you pass out (faint) for a short time. It is caused by a sudden decrease in blood flow to the brain.  Signs that you may be about to faint include feeling dizzy, light-headed, or sick to your stomach, seeing all white or all black, or having cold, clammy skin.  If you start to feel like you might pass out, lie down right away and raise (elevate) your feet above the level of your heart. Breathe deeply and steadily. Wait until all of the symptoms are gone. This information is not intended to replace advice given to you by your health care provider. Make sure you discuss any questions you have with your health care provider. Document Revised: 04/19/2017 Document Reviewed: 04/19/2017 Elsevier Patient Education  2020 [employer]  

## 2019-04-27 NOTE — Progress Notes (Signed)
EEG complete - results pending 

## 2019-04-27 NOTE — Plan of Care (Signed)

## 2019-04-27 NOTE — Procedures (Signed)
ELECTROENCEPHALOGRAM REPORT   Patient: Patricia Wall       Room #: 4W10U  Age: 69 y.o.        Sex: female Requesting Physician: Toniann Fail Report Date:  04/27/2019        Interpreting Physician: Thana Farr  History: Patricia Wall is an 69 y.o. female with syncope and altered mental status  Medications:  Eliquis, ASA, Lipitor, Plavix, Aricept, Cymbalta, Neurontin, Insulin, Femara, Dulera  Conditions of Recording:  This is a 21 channel routine scalp EEG performed with bipolar and monopolar montages arranged in accordance to the international 10/20 system of electrode placement. One channel was dedicated to EKG recording.  The patient is in the awake, drowsy and asleep states.  Description:  The waking background activity consists of a low voltage, symmetrical, fairly well organized, 7 Hz theta activity, seen from the parieto-occipital and posterior temporal regions.  Low voltage fast activity, poorly organized, is seen anteriorly and is at times superimposed on more posterior regions.  A mixture of theta and alpha rhythms are seen from the central and temporal regions. The patient drowses with slowing to irregular, low voltage theta and beta activity.   The patient goes in to a light sleep with symmetrical sleep spindles, vertex central sharp transients and irregular slow activity.  No epileptiform activity is noted.   Hyperventilation was not performed.  Intermittent photic stimulation was performed but failed to illicit any change in the tracing.    IMPRESSION: This is an abnormal EEG secondary to posterior background slowing.  This finding may be seen with a diffuse gray matter disturbance that is etiologically nonspecific, but may include a dementia, among other possibilities.  No epileptiform activity is noted.     Thana Farr, MD Neurology [phone] 04/27/2019, 3:12 PM

## 2019-04-27 NOTE — H&P (Signed)
History and Physical    Patricia Wall YQI:[id number] DOB: Jan 20, 1951 DOA: 04/26/2019  PCP: Bernerd Limbo, MD  Patient coming from: Home.  Chief Complaint: Loss of consciousness.  HPI: Patricia Wall is a 69 y.o. female with history of COPD, CAD status post recent stenting, diastolic dysfunction, chronic anemia recently admitted for acute respiratory failure with acute encephalopathy in the setting of hypercarbia and COPD exacerbation diastolic dysfunction had chest pain and had to undergo a stent placement also had anemia EGD done showed nothing significant during the recent admission discharge about 4 days ago was brought to the ER after patient had a syncopal episode as witnessed by the family.  Patient was walking when patient suddenly felt dizzy and lost consciousness and was helped onto the floor by the family.  Had a brief episode of convulsive episode did not have any incontinence of urine or bowel and lasted for around 10 seconds.  Patient does not recall the convulsive episode but does know that she passed out.  Denies any chest pain shortness of breath palpitation prior to the episode.  ED Course: In the ER patient was afebrile with a blood pressure systolic 38V was given fluid bolus following which blood pressure improved.  Patient appeared nonfocal.  EKG showed normal sinus rhythm with nonspecific changes.  Labs show increased creatinine from baseline was around 1.3 at discharge about 4 days ago it was 0.7.  Hemoglobin is stable Covid test is negative chest x-ray shows chronic changes.  Patient admitted for syncope with hypotension.  Covid test was negative CT head is unremarkable.  Review of Systems: As per HPI, rest all negative.   Past Medical History:  Diagnosis Date  . Abnormal chest xray   . Alcohol abuse   . Back pain   . CAD (coronary artery disease)     Prev seen by [facility].  Stent to OM and LAD Last cath 6/11 cathed on June 13, [name]. 2011.  The cardiac catheterization  showed 30% and 50% lesions in the mid  and distal LAD, 50% in the OM1, 40% in the OM2 with 20% in-stent  restenosis, 40% in-stent restenosis in the circumflex and no significant  disease in the RCA.  Her EF was normal.  Dr. Olevia Perches recommended a trial  of proton pump inhibitors   . Chest pain   . COPD (chronic obstructive pulmonary disease) ([place])   . Cyanosis   . DDD (degenerative disc disease)   . Depression   . Diarrhea   . Diverticular disease   . GERD (gastroesophageal reflux disease)   . H/O: hysterectomy   . Hemorrhoids   . HTN (hypertension)   . IBS (irritable bowel syndrome)   . Insomnia   . Lymphadenitis   . Pharyngitis   . PUD (peptic ulcer disease)   . PVD (peripheral vascular disease) ([place])   . Sinusitis   . TIA (transient ischemic attack)   . Tobacco abuse   . Vertigo     Past Surgical History:  Procedure Laterality Date  . ABDOMINAL HYSTERECTOMY    . APPENDECTOMY    . BREAST LUMPECTOMY Left 2015  . CARDIAC CATHETERIZATION  2005, 2006, 2009,2011  . CHOLECYSTECTOMY    . CORONARY STENT INTERVENTION N/A 04/22/2019   Procedure: CORONARY STENT INTERVENTION;  Surgeon: Troy Sine, MD;  Location: [place] [facility];  Service: Cardiovascular;  Laterality: N/A;  . CORONARY STENT PLACEMENT     Status post balloon angiogram plasty and Cypher stent to the distal  circumflex and OM2 branch  . ESOPHAGOGASTRODUODENOSCOPY (EGD) WITH PROPOFOL Left 04/21/2019   Procedure: ESOPHAGOGASTRODUODENOSCOPY (EGD) WITH PROPOFOL;  Surgeon: Arta Silence, MD;  Location: [facility];  Service: Endoscopy;  Laterality: Left;  . Hammertoe repair    . Left Bunionectomy    . LEFT HEART CATH AND CORONARY ANGIOGRAPHY N/A 04/22/2019   Procedure: LEFT HEART CATH AND CORONARY ANGIOGRAPHY;  Surgeon: Troy Sine, MD;  Location: [place] [facility];  Service: Cardiovascular;  Laterality: N/A;  . LEFT HEART CATHETERIZATION WITH CORONARY ANGIOGRAM N/A 12/03/2013   Procedure: LEFT HEART CATHETERIZATION  WITH CORONARY ANGIOGRAM;  Surgeon: Leonie Man, MD;  Location[facility];  Service: Cardiovascular;  Laterality: N/A;  . TUBAL LIGATION       reports that she has quit smoking. She has never used smokeless tobacco. She reports current drug use. She reports that she does not drink alcohol.  No Known Allergies  Family History  Problem Relation Age of Onset  . Heart disease Mother   . Rheumatologic disease Mother   . Cancer - Other Mother        Uterine  . Emphysema Father   . Heart disease Father   . Cancer - Other Father   . Coronary artery disease Other   . Asthma Sister        2 sisters  . Stroke Sister     Prior to Admission medications   Medication Sig Start Date End Date Taking? Authorizing Provider  acetaminophen (TYLENOL) 325 MG tablet Take 2 tablets (650 mg total) by mouth every 4 (four) hours as needed for headache or mild pain. 04/23/19  Yes Sheikh, Omair Latif, DO  acidophilus (RISAQUAD) CAPS capsule Take 1 capsule by mouth 2 (two) times daily. 04/09/19  Yes [provider]  albuterol (PROVENTIL) (2.5 MG/3ML) 0.083% nebulizer solution USE 1 VIAL IN NEBULIZER EVERY 4 HOURS AS NEEDED FOR WHEEZING OR SHORTNESS OF BREATH. Patient taking differently: Take 2.5 mg by nebulization every 4 (four) hours as needed for wheezing or shortness of breath. USE 1 VIAL IN NEBULIZER EVERY 4 HOURS AS NEEDED FOR WHEEZING OR SHORTNESS OF BREATH. 07/14/17  Yes Tanda Rockers, MD  albuterol (VENTOLIN HFA) 108 (90 Base) MCG/ACT inhaler Inhale 2 puffs into the lungs every 6 (six) hours as needed for wheezing or shortness of breath. 07/13/17  Yes Tanda Rockers, MD  apixaban (ELIQUIS) 5 MG TABS tablet Take 1 tablet (5 mg total) by mouth 2 (two) times daily. 04/23/19  Yes Sheikh, Omair Latif, DO  aspirin 81 MG chewable tablet Chew 1 tablet (81 mg total) by mouth daily. 04/24/19  Yes Sheikh, Omair Latif, DO  atorvastatin (LIPITOR) 80 MG tablet Take 80 mg by mouth daily.   Yes [provider]  blood glucose meter kit and supplies Dispense based on patient and insurance preference. Use up to four times daily as directed. (FOR ICD-9 250.00, 250.01). 01/13/15  Yes Reyne Dumas, MD  budesonide-formoterol (SYMBICORT) 160-4.5 MCG/ACT inhaler Inhale 2 puffs into the lungs 2 (two) times daily. 10/26/16  Yes Parrett, Tammy S, NP  butalbital-acetaminophen-caffeine (FIORICET, ESGIC) 50-325-40 MG tablet Take 1-1.5 tablets by mouth every 8 (eight) hours as needed for headache or migraine. 1 - 1 and 1/2 every 8 hours as needed for headache    Yes [provider]  calcium carbonate (OS-CAL) 600 MG TABS Take 600 mg by mouth every morning.    Yes [provider]  Cholecalciferol (VITAMIN D) 2000 UNITS tablet Take  2,000 Units by mouth every morning.    Yes [provider]  clopidogrel (PLAVIX) 75 MG tablet Take 1 tablet (75 mg total) by mouth daily with breakfast. 04/24/19  Yes Sheikh, Omair Latif, DO  Dextromethorphan-Guaifenesin ([place] FAST-MAX DM MAX) 5-100 MG/5ML LIQD Take 5 mLs by mouth every 4 (four) hours as needed (cough/ congestiom). 1 tsp every 4 hours as needed for cough/congestion    Yes [provider]  diltiazem (CARDIZEM CD) 120 MG 24 hr capsule Take 120 mg by mouth daily.  04/05/19  Yes [provider]  donepezil (ARICEPT) 5 MG tablet Take 5 mg by mouth daily.  01/10/18  Yes [provider]  DULoxetine (CYMBALTA) 60 MG capsule Take 60 mg by mouth every morning.    Yes [provider]  furosemide (LASIX) 40 MG tablet Take 1 tablet by mouth 2 (two) times daily. 04/07/19  Yes [provider]  gabapentin (NEURONTIN) 300 MG capsule Take 300 mg by mouth 3 (three) times daily.    Yes [provider]  Lancets (FREESTYLE) lancets Use as instructed 01/13/15  Yes Reyne Dumas, MD  letrozole (FEMARA) 2.5 MG tablet Take 2.5 mg by mouth every morning.    Yes [provider]  losartan (COZAAR) 25 MG  tablet Take 25 mg by mouth daily.  04/05/19  Yes [provider]  metFORMIN (GLUCOPHAGE) 1000 MG tablet Take 1,000 mg by mouth 2 (two) times daily with a meal.   Yes [provider]  methocarbamol (ROBAXIN) 750 MG tablet Take 750 mg by mouth every 8 (eight) hours as needed for muscle spasms.   Yes [provider]  metoprolol succinate (TOPROL-XL) 25 MG 24 hr tablet Take 25 mg by mouth daily.  04/05/19  Yes [provider]  Multiple Vitamin (MULTIVITAMIN) tablet Take 1 tablet by mouth daily.    Yes [provider]  nitroGLYCERIN (NITROSTAT) 0.4 MG SL tablet Place 1 tablet (0.4 mg total) under the tongue every 5 (five) minutes as needed for chest pain (may repeat x3). 02/02/18  Yes Josue Hector, MD  omeprazole (PRILOSEC) 40 MG capsule Take 40 mg by mouth daily before breakfast.    Yes [provider]  ondansetron (ZOFRAN) 8 MG tablet Take 8 mg by mouth every 4 (four) hours as needed for nausea.    Yes [provider]  OXYGEN Use 4L 24/7   Yes [provider]  potassium chloride SA (KLOR-CON M20) 20 MEQ tablet Take 20 mEq by mouth 2 (two) times daily.  04/05/19  Yes [provider]  predniSONE (STERAPRED UNI-PAK 21 TAB) 10 MG (21) TBPK tablet Take 6 tablets Day 1, 5 Tablets Day 2, 4 Tablets Day 3, 3 Tablets Day 4, 2 Tablets Day 5, 1 Tablet Day 6 and Stop Day 7 Patient taking differently: Take 10 mg by mouth See admin instructions. Take 6 tablets Day 1, 5 Tablets Day 2, 4 Tablets Day 3, 3 Tablets Day 4, 2 Tablets Day 5, 1 Tablet Day 6 and Stop Day 7 04/23/19  Yes Sheikh, Omair Latif, DO  Tiotropium Bromide Monohydrate (SPIRIVA RESPIMAT) 2.5 MCG/ACT AERS Inhale 2 puffs into the lungs daily. 10/26/16  Yes Parrett, Tammy S, NP  Erenumab-aooe (AIMOVIG [place]) Inject 1 Dose into the skin as directed.     [provider]  Respiratory Therapy Supplies (FLUTTER) DEVI Use as directed 07/13/17   Tanda Rockers, MD    Physical  Exam: Constitutional: Moderately built and nourished. Vitals:   04/26/19  1945 04/26/19 2041 04/26/19 2100 04/26/19 2208  BP: (!) 114/57 (!) 134/123 (!) 100/47 (!) 97/56  Pulse: 64 (!) 58 (!) 59 83  Resp: 20 12 14 19   Temp:    97.6 F (36.4 C)  TempSrc:    Oral  SpO2: 100% 100% 100% 93%  Weight:    65.8 kg  Height:    5' 6"  (1.676 m)   Eyes: Anicteric no pallor. ENMT: No discharge from the ears eyes nose or mouth. Neck: No mass or.  No neck rigidity. Respiratory: No rhonchi or crepitations. Cardiovascular: S1-S2 heard. Abdomen: Soft nontender bowel sounds present. Musculoskeletal: No edema.  No joint effusion. Skin: No rash. Neurologic: Alert awake oriented to time place and person.  Moves all extremities. Psychiatric: Appears normal per normal affect.   Labs on Admission: I have personally reviewed following labs and imaging studies  CBC: Recent Labs  Lab 04/20/19 0509 04/20/19 0509 04/21/19 0224 04/22/19 0248 04/23/19 0218 04/26/19 1823 04/26/19 1859  WBC 7.2  --  9.2 10.1 7.4 13.1*  --   NEUTROABS 6.3  --  7.2 7.4 4.4 9.2*  --   HGB 7.3*   < > 9.6* 9.9* 10.9* 9.5* 9.9*  HCT 24.4*   < > 30.2* 31.6* 34.4* 32.3* 29.0*  MCV 92.4  --  87.3 89.3 89.1 95.8  --   PLT 135*  --  165 189 184 288  --    < > = values in this interval not displayed.   Basic Metabolic Panel: Recent Labs  Lab 04/20/19 0509 04/20/19 0509 04/21/19 0224 04/22/19 0248 04/23/19 0218 04/26/19 1823 04/26/19 1859  NA 132*   < > 133* 135 139 138 134*  K 3.2*   < > 3.7 3.7 3.6 4.7 4.4  CL 83*  --  87* 89* 95* 98  --   CO2 37*  --  35* 33* 36* 30  --   GLUCOSE 210*  --  322* 209* 144* 80  --   BUN 23  --  28* 29* 20 44*  --   CREATININE 0.63  --  0.88 0.77 0.74 1.33*  --   CALCIUM 7.4*  --  8.3* 8.4* 8.3* 8.9  --   MG 1.7  --  2.3 1.9 1.8 1.6*  --   PHOS 3.2  --  2.0* 2.3* 3.9  --   --    < > = values in this interval not displayed.   GFR: Estimated Creatinine Clearance: 36.8 mL/min (A)  (by C-G formula based on SCr of 1.33 mg/dL (H)). Liver Function Tests: Recent Labs  Lab 04/20/19 1150 04/21/19 0224 04/22/19 0248 04/23/19 0218 04/26/19 1823  AST 19 16 18 25 18   ALT 24 23 22 30 26   ALKPHOS 71 85 96 104 88  BILITOT 0.7 0.9 0.6 0.6 0.4  PROT 5.6* 5.7* 5.6* 5.5* 5.3*  ALBUMIN 2.7* 2.7* 2.8* 2.7* 2.9*   No results for input(s): LIPASE, AMYLASE in the last 168 hours. No results for input(s): AMMONIA in the last 168 hours. Coagulation Profile: No results for input(s): INR, PROTIME in the last 168 hours. Cardiac Enzymes: No results for input(s): CKTOTAL, CKMB, CKMBINDEX, TROPONINI in the last 168 hours. BNP (last 3 results) Recent Labs    04/15/19 1225  PROBNP 711*   HbA1C: No results for input(s): HGBA1C in the last 72 hours. CBG: Recent Labs  Lab 04/22/19 2156 04/23/19 0825 04/23/19 1153 04/26/19 1823 04/26/19 2305  GLUCAP 138* 149* 203* 77 111*  Lipid Profile: No results for input(s): CHOL, HDL, LDLCALC, TRIG, CHOLHDL, LDLDIRECT in the last 72 hours. Thyroid Function Tests: Recent Labs    04/26/19 1824  TSH 2.564   Anemia Panel: No results for input(s): VITAMINB12, FOLATE, FERRITIN, TIBC, IRON, RETICCTPCT in the last 72 hours. Urine analysis:    Component Value Date/Time   COLORURINE YELLOW 04/26/2019 2030   [place] 04/26/2019 2030   LABSPEC 1.006 04/26/2019 2030   PHURINE 6.0 04/26/2019 2030   GLUCOSEU NEGATIVE 04/26/2019 2030   [place] NEGATIVE 04/26/2019 2030   [place] NEGATIVE 04/26/2019 2030   [place] 04/26/2019 2030   PROTEINUR NEGATIVE 04/26/2019 2030   UROBILINOGEN 1.0 10/10/2009 2052   NITRITE NEGATIVE 04/26/2019 2030   LEUKOCYTESUR MODERATE (A) 04/26/2019 2030   Sepsis Labs: @LABRCNTIP (procalcitonin:4,lacticidven:4) ) Recent Results (from the past 240 hour(s))  Blood culture (routine x 2)     Status: None   Collection Time: 04/17/19  8:50 PM   Specimen: BLOOD  Result Value Ref Range Status    Specimen Description BLOOD RIGHT ARM  Final   Special Requests   Final    BOTTLES DRAWN AEROBIC AND ANAEROBIC Blood Culture results may not be optimal due to an inadequate volume of blood received in culture bottles   Culture   Final    NO GROWTH 5 DAYS Performed at [facility], [place] [address] [place], [place] [address]    Report Status 04/22/2019 FINAL  Final  Blood culture (routine x 2)     Status: None   Collection Time: 04/17/19  9:07 PM   Specimen: BLOOD  Result Value Ref Range Status   Specimen Description BLOOD RIGHT FOREARM  Final   Special Requests   Final    BOTTLES DRAWN AEROBIC AND ANAEROBIC Blood Culture adequate volume   Culture   Final    NO GROWTH 5 DAYS Performed at [facility], [place] [address] [place], [place] [address]    Report Status 04/22/2019 FINAL  Final  Respiratory Panel by RT PCR (Flu A&B, Covid) - Nasopharyngeal Swab     Status: None   Collection Time: 04/17/19  9:40 PM   Specimen: Nasopharyngeal Swab  Result Value Ref Range Status   SARS Coronavirus 2 by RT PCR NEGATIVE NEGATIVE Final    Comment: (NOTE) SARS-CoV-2 target nucleic acids are NOT DETECTED. The SARS-CoV-2 RNA is generally detectable in upper respiratoy specimens during the acute phase of infection. The lowest concentration of SARS-CoV-2 viral copies this assay can detect is 131 copies/mL. A negative result does not preclude SARS-Cov-2 infection and should not be used as the sole basis for treatment or other patient management decisions. A negative result may occur with  improper specimen collection/handling, submission of specimen other than nasopharyngeal swab, presence of viral mutation(s) within the areas targeted by this assay, and inadequate number of viral copies (<131 copies/mL). A negative result must be combined with clinical observations, patient history, and epidemiological information. The expected result is Negative. Fact Sheet for Patients:   PinkCheek.be Fact Sheet for Healthcare Providers:  GravelBags.it This test is not yet ap proved or cleared by the [place] FDA and  has been authorized for detection and/or diagnosis of SARS-CoV-2 by FDA under an Emergency Use Authorization (EUA). This EUA will remain  in effect (meaning this test can be used) for the duration of the COVID-19 declaration under Section 564(b)(1) of the Act, 21 U.S.C. section 360bbb-3(b)(1), unless the authorization is terminated or revoked sooner.  Influenza A by PCR NEGATIVE NEGATIVE Final   Influenza B by PCR NEGATIVE NEGATIVE Final    Comment: (NOTE) The Xpert Xpress SARS-CoV-2/FLU/RSV assay is intended as an aid in  the diagnosis of influenza from Nasopharyngeal swab specimens and  should not be used as a sole basis for treatment. Nasal washings and  aspirates are unacceptable for Xpert Xpress SARS-CoV-2/FLU/RSV  testing. Fact Sheet for Patients: PinkCheek.be Fact Sheet for Healthcare Providers: GravelBags.it This test is not yet approved or cleared by the [place] FDA and  has been authorized for detection and/or diagnosis of SARS-CoV-2 by  FDA under an Emergency Use Authorization (EUA). This EUA will remain  in effect (meaning this test can be used) for the duration of the  Covid-19 declaration under Section 564(b)(1) of the Act, 21  U.S.C. section 360bbb-3(b)(1), unless the authorization is  terminated or revoked. Performed at [facility], [place] [address] [place], [place] [address]   SARS CORONAVIRUS 2 (TAT 6-24 HRS) Nasopharyngeal Nasopharyngeal Swab     Status: None   Collection Time: 04/18/19 12:35 AM   Specimen: Nasopharyngeal Swab  Result Value Ref Range Status   SARS Coronavirus 2 NEGATIVE NEGATIVE Final    Comment: (NOTE) SARS-CoV-2 target nucleic acids are NOT DETECTED. The SARS-CoV-2 RNA is  generally detectable in upper and lower respiratory specimens during the acute phase of infection. Negative results do not preclude SARS-CoV-2 infection, do not rule out co-infections with other pathogens, and should not be used as the sole basis for treatment or other patient management decisions. Negative results must be combined with clinical observations, patient history, and epidemiological information. The expected result is Negative. Fact Sheet for Patients: SugarRoll.be Fact Sheet for Healthcare Providers: https://www.woods-mathews.com/ This test is not yet approved or cleared by the [place] FDA and  has been authorized for detection and/or diagnosis of SARS-CoV-2 by FDA under an Emergency Use Authorization (EUA). This EUA will remain  in effect (meaning this test can be used) for the duration of the COVID-19 declaration under Section 56 4(b)(1) of the Act, 21 U.S.C. section 360bbb-3(b)(1), unless the authorization is terminated or revoked sooner. Performed at [facility], [place] [address] [place], [place] [address]   MRSA PCR Screening     Status: None   Collection Time: 04/20/19  8:21 AM   Specimen: Nasal Swab; Nasopharyngeal  Result Value Ref Range Status   MRSA by PCR NEGATIVE NEGATIVE Final    Comment:        The GeneXpert MRSA Assay (FDA approved for NASAL specimens only), is one component of a comprehensive MRSA colonization surveillance program. It is not intended to diagnose MRSA infection nor to guide or monitor treatment for MRSA infections. Performed at [facility], [place] [address], [place], [place] [address]      Radiological Exams on Admission: CT Head Wo Contrast  Result Date: 04/26/2019 CLINICAL DATA:  Syncope EXAM: CT HEAD WITHOUT CONTRAST TECHNIQUE: Contiguous axial images were obtained from the base of the skull through the vertex without intravenous contrast. COMPARISON:  None. FINDINGS:  Brain: No evidence of acute infarction, hemorrhage, hydrocephalus, extra-axial collection or mass lesion/mass effect. Mild periventricular white matter hypodensity. Vascular: No hyperdense vessel or unexpected calcification. Skull: Normal. Negative for fracture or focal lesion. Sinuses/Orbits: No acute finding. Other: None. IMPRESSION: No acute intracranial pathology.  Small-vessel white matter disease. Electronically Signed   By: Eddie Candle M.D.   On: 04/26/2019 20:47   DG Chest Portable 1  View  Result Date: 04/26/2019 CLINICAL DATA:  Syncope. EXAM: PORTABLE CHEST 1 VIEW COMPARISON:  April 23, 2019 FINDINGS: Mild, chronic appearing increased interstitial lung markings are seen. There is no evidence of acute infiltrate, pleural effusion or pneumothorax. The cardiac silhouette is borderline in size. There is moderate severity calcification of the aortic arch. The visualized skeletal structures are unremarkable. IMPRESSION: Chronic appearing increased interstitial lung markings without evidence of acute or active cardiopulmonary disease. Electronically Signed   By: Virgina Norfolk M.D.   On: 04/26/2019 19:51    EKG: Independently reviewed.  Normal sinus rhythm nonspecific changes.  Assessment/Plan Principal Problem:   Syncope Active Problems:   CAD S/P percutaneous coronary angioplasty: Prior Cypher DES to pLAD, pOM2 & AVG Cx (after Om2); New DES to AVG Cx ISR & Angiosculpt PTCA of Ostial AVG Cx from OM2.   Peripheral vascular disease (HCC)   AF (paroxysmal atrial fibrillation) (HCC)   ARF (acute renal failure) (HCC)   Hypotension   Diabetes mellitus type 2 in nonobese ([place])    1. Syncope likely from hypotension patient was recently being diuresed.  Blood pressure improved with fluid bolus.  Will hold off patient's diuretics for now along with holding off antihypertensives.  Closely monitoring telemetry.  Did have some convulsions noticed by the family likely could be from convulsive syncope  from hypotension.  CT head unremarkable we will check EEG. 2. Hypotension likely from dehydration.  Does not look septic.  Check cortisol level. 3. Acute renal failure from dehydration.  Holding off antihypertensives and diuretics for now.  Did receive fluids.  Recheck metabolic panel. 4. CAD status post stenting continue aspirin Plavix on apixaban statins.  5.  Holding of beta-blockers due to low normal blood pressure.  If blood pressure continues to remain stable will restart at a lower dose. 6. Chronic diastolic CHF last EF measured on April 19, 2019 last month was 60 to 65% with grade 1 diastolic dysfunction.  Holding diuretics due to low blood pressure.  Resume diuretics at a lower dose once patient blood pressure improves.  Presently looks dehydrated. 7. COPD not actively wheezing. 8. Paroxysmal atrial fibrillation presently in sinus rhythm.  Holding beta-blockers Cardizem due to low normal blood pressure.  Restart of low-dose once blood pressure stabilizes. 9. Diabetes mellitus type 2 on sliding scale coverage for now. 10. Chronic anemia follow CBC. 11. Leukocytosis could be reactionary or from recent use of steroids. 12. Peripheral vascular disease.   DVT prophylaxis: Apixaban. Code Status: Full code. Family Communication: Discussed with patient. Disposition Plan: Home. Consults called: None. Admission status: Observation.   Rise Patience MD [facility] Pager [phone].  If 7PM-7AM, please contact night-coverage [email or web address] Password [facility]  04/27/2019, 12:44 AM

## 2019-04-27 NOTE — Discharge Summary (Signed)
Physician Discharge Summary  Patricia Wall IPJ:[id number] DOB: Feb 23, 1951 DOA: 04/26/2019  PCP: Bernerd Limbo, MD  Admit date: 04/26/2019 Discharge date: 04/27/2019  Admitted From: Home Disposition: Home  Recommendations for Outpatient Follow-up:  1. Follow up with PCP in 1-2 weeks 2. Follow-up with hematology/oncology in 1 week 3. Please obtain BMP/CBC in one week 4. Please follow up on the following pending results:  Home Health: None Equipment/Devices: None  Discharge Condition: Stable CODE STATUS: Full code Diet recommendation: Cardiac  Subjective: Seen and examined.  She has no complaints.  No dizziness chest pain, shortness of breath.  HPI: Patricia Wall is a 69 y.o. female with history of COPD, CAD status post recent stenting, diastolic dysfunction, chronic anemia recently admitted for acute respiratory failure with acute encephalopathy in the setting of hypercarbia and COPD exacerbation diastolic dysfunction had chest pain and had to undergo a stent placement also had anemia EGD done showed nothing significant during the recent admission discharge about 4 days ago was brought to the ER after patient had a syncopal episode as witnessed by the family.  Patient was walking when patient suddenly felt dizzy and lost consciousness and was helped onto the floor by the family.  Had a brief episode of convulsive episode did not have any incontinence of urine or bowel and lasted for around 10 seconds.  Patient does not recall the convulsive episode but does know that she passed out.  Denies any chest pain shortness of breath palpitation prior to the episode.  ED Course: In the ER patient was afebrile with a blood pressure systolic 73A was given fluid bolus following which blood pressure improved.  Patient appeared nonfocal.  EKG showed normal sinus rhythm with nonspecific changes.  Labs show increased creatinine from baseline was around 1.3 at discharge about 4 days ago it was 0.7.  Hemoglobin  is stable Covid test is negative chest x-ray shows chronic changes.  Patient admitted for syncope with hypotension.  Covid test was negative CT head is unremarkable.  Brief/Interim Summary: Patient was admitted due to syncope which was likely secondary to hypotension as she was found to have orthostatic hypotension upon presentation.  CT head was unremarkable.  Apparently, family had noticed some convulsions at the time of syncope but there was no tongue biting, bowel or bladder incontinence.  I doubt seizure.  She has no prior history of seizure.  She was admitted and her antihypertensives as well as diuretics were held due to low blood pressure however rest of the medications were continued.  She received some IV fluids.  Her blood pressure improved and so did her symptoms.  She had mild leukocytosis which also improved.  No signs of infection.  No fever.  I received a message to call her daughter Mikeyla Music.  I talked to her.  She was very much concerned about patient's peripheral smear showing increased number of abnormal immature granulocytes from 0.14 few days ago to 0.52 however patient's hemoglobin and in fact, her platelets are also higher than they were previously.  I also explained that her that although I cannot say for sure if increasing number of granulocytes has any clinical significance as that would be a question for oncologist however CBC in fact is showing signs of hemoconcentration which is very much possible since she was dehydrated. The daughter initially wanted me to consider bone marrow biopsy on urgent basis.  I explained to her that in order to entertain her request, I will need to consult hematology/oncology  and that they will likely see her tomorrow since it is already afternoon and that she will need to stay in the hospital for that.  I also explained that if oncologist decides to pursue bone marrow biopsy, that will likely not be done until early next week.  The fact that she was  seen by oncologist 6 days ago and they did not feel that bone marrow biopsy was urgently needed and the fact that she is going to see oncologist within a week, I did not feel bone marrow biopsy was needed urgently.  Despite of all of that discussion, I offered her to choose the option for keeping her mother in the hospital for oncology consultation or discharging home and repeating her CBC on Monday when she is going to see her PCP.  Daughter chose the latter option and requested to discharge.  Due to her hypotension, I have elected to reduce her Lasix dose from 40 mg twice daily to once daily.  I was unable to discuss this with the daughter over the phone initially.  I then called the daughter again to discuss her Lasix dose change however no one picked up the phone and I left a voicemail to call me back.  Discharge Diagnoses:  Principal Problem:   Syncope Active Problems:   CAD S/P percutaneous coronary angioplasty: Prior Cypher DES to pLAD, pOM2 & AVG Cx (after Om2); New DES to AVG Cx ISR & Angiosculpt PTCA of Ostial AVG Cx from OM2.   Peripheral vascular disease (HCC)   AF (paroxysmal atrial fibrillation) (HCC)   ARF (acute renal failure) (HCC)   Hypotension   Diabetes mellitus type 2 in nonobese [facility])    Discharge Instructions  Discharge Instructions    Discharge patient   Complete by: As directed    Discharge disposition: 01-Home or Self Care   Discharge patient date: 04/27/2019     Allergies as of 04/27/2019   No Known Allergies     Medication List    TAKE these medications   acetaminophen 325 MG tablet Commonly known as: TYLENOL Take 2 tablets (650 mg total) by mouth every 4 (four) hours as needed for headache or mild pain.   acidophilus Caps capsule Take 1 capsule by mouth 2 (two) times daily.   AIMOVIG [place] Inject 1 Dose into the skin as directed.   albuterol 108 (90 Base) MCG/ACT inhaler Commonly known as: Ventolin HFA Inhale 2 puffs into the lungs every 6 (six) hours  as needed for wheezing or shortness of breath. What changed: Another medication with the same name was changed. Make sure you understand how and when to take each.   albuterol (2.5 MG/3ML) 0.083% nebulizer solution Commonly known as: PROVENTIL USE 1 VIAL IN NEBULIZER EVERY 4 HOURS AS NEEDED FOR WHEEZING OR SHORTNESS OF BREATH. What changed:   how much to take  how to take this  when to take this  reasons to take this   apixaban 5 MG Tabs tablet Commonly known as: ELIQUIS Take 1 tablet (5 mg total) by mouth 2 (two) times daily.   aspirin 81 MG chewable tablet Chew 1 tablet (81 mg total) by mouth daily.   atorvastatin 80 MG tablet Commonly known as: LIPITOR Take 80 mg by mouth daily.   blood glucose meter kit and supplies Dispense based on patient and insurance preference. Use up to four times daily as directed. (FOR ICD-9 250.00, 250.01).   budesonide-formoterol 160-4.5 MCG/ACT inhaler Commonly known as: SYMBICORT Inhale 2 puffs into  the lungs 2 (two) times daily.   butalbital-acetaminophen-caffeine 50-325-40 MG tablet Commonly known as: FIORICET Take 1-1.5 tablets by mouth every 8 (eight) hours as needed for headache or migraine. 1 - 1 and 1/2 every 8 hours as needed for headache   calcium carbonate 600 MG Tabs tablet Commonly known as: OS-CAL Take 600 mg by mouth every morning.   clopidogrel 75 MG tablet Commonly known as: PLAVIX Take 1 tablet (75 mg total) by mouth daily with breakfast.   diltiazem 120 MG 24 hr capsule Commonly known as: CARDIZEM CD Take 120 mg by mouth daily.   donepezil 5 MG tablet Commonly known as: ARICEPT Take 5 mg by mouth daily.   DULoxetine 60 MG capsule Commonly known as: CYMBALTA Take 60 mg by mouth every morning.   Flutter Devi Use as directed   freestyle lancets Use as instructed   furosemide 40 MG tablet Commonly known as: LASIX Take 1 tablet (40 mg total) by mouth daily. What changed: when to take this   gabapentin  300 MG capsule Commonly known as: NEURONTIN Take 300 mg by mouth 3 (three) times daily.   Klor-Con M20 20 MEQ tablet Generic drug: potassium chloride SA Take 20 mEq by mouth 2 (two) times daily.   letrozole 2.5 MG tablet Commonly known as: FEMARA Take 2.5 mg by mouth every morning.   losartan 25 MG tablet Commonly known as: COZAAR Take 25 mg by mouth daily.   metFORMIN 1000 MG tablet Commonly known as: GLUCOPHAGE Take 1,000 mg by mouth 2 (two) times daily with a meal.   methocarbamol 750 MG tablet Commonly known as: ROBAXIN Take 750 mg by mouth every 8 (eight) hours as needed for muscle spasms.   metoprolol succinate 25 MG 24 hr tablet Commonly known as: TOPROL-XL Take 25 mg by mouth daily.   Mucinex Fast-Max DM Max 5-100 MG/5ML Liqd Generic drug: Dextromethorphan-guaiFENesin Take 5 mLs by mouth every 4 (four) hours as needed (cough/ congestiom). 1 tsp every 4 hours as needed for cough/congestion   multivitamin tablet Take 1 tablet by mouth daily.   nitroGLYCERIN 0.4 MG SL tablet Commonly known as: NITROSTAT Place 1 tablet (0.4 mg total) under the tongue every 5 (five) minutes as needed for chest pain (may repeat x3).   omeprazole 40 MG capsule Commonly known as: PRILOSEC Take 40 mg by mouth daily before breakfast.   ondansetron 8 MG tablet Commonly known as: ZOFRAN Take 8 mg by mouth every 4 (four) hours as needed for nausea.   OXYGEN Use 4L 24/7   predniSONE 10 MG (21) Tbpk tablet Commonly known as: STERAPRED UNI-PAK 21 TAB Take 6 tablets Day 1, 5 Tablets Day 2, 4 Tablets Day 3, 3 Tablets Day 4, 2 Tablets Day 5, 1 Tablet Day 6 and Stop Day 7 What changed:   how much to take  how to take this  when to take this   Tiotropium Bromide Monohydrate 2.5 MCG/ACT Aers Commonly known as: Spiriva Respimat Inhale 2 puffs into the lungs daily.   Vitamin D 50 MCG (2000 UT) tablet Take 2,000 Units by mouth every morning.      Follow-up Information    Bernerd Limbo, MD Follow up in 1 week(s).   Specialty: Family Medicine Contact information: [address] [place] [address] [phone]        Josue Hector, MD .   Specialty: Cardiology Contact information: [phone] [address] [place] [address] [phone]  No Known Allergies  Consultations: None   Procedures/Studies: EEG  Result Date: 04/27/2019 Alexis Goodell, MD     04/27/2019  3:15 PM ELECTROENCEPHALOGRAM REPORT Patient: Patricia Wall       Room #: 2P53I Age: 69 y.o.        Sex: female Requesting Physician: Hal Hope Report Date:  04/27/2019       Interpreting Physician: Alexis Goodell History: CATALEYA CRISTINA is an 69 y.o. female with syncope and altered mental status Medications: Eliquis, ASA, Lipitor, Plavix, Aricept, Cymbalta, Neurontin, Insulin, Femara, Dulera Conditions of Recording:  This is a 21 channel routine scalp EEG performed with bipolar and monopolar montages arranged in accordance to the international 10/20 system of electrode placement. One channel was dedicated to EKG recording. The patient is in the awake, drowsy and asleep states. Description:  The waking background activity consists of a low voltage, symmetrical, fairly well organized, 7 Hz theta activity, seen from the parieto-occipital and posterior temporal regions.  Low voltage fast activity, poorly organized, is seen anteriorly and is at times superimposed on more posterior regions.  A mixture of theta and alpha rhythms are seen from the central and temporal regions. The patient drowses with slowing to irregular, low voltage theta and beta activity.  The patient goes in to a light sleep with symmetrical sleep spindles, vertex central sharp transients and irregular slow activity. No epileptiform activity is noted.  Hyperventilation was not performed.  Intermittent photic stimulation was performed but failed to illicit any change in the tracing.  IMPRESSION: This  is an abnormal EEG secondary to posterior background slowing.  This finding may be seen with a diffuse gray matter disturbance that is etiologically nonspecific, but may include a dementia, among other possibilities.  No epileptiform activity is noted.  Alexis Goodell, MD Neurology [phone] 04/27/2019, 3:12 PM   EEG  Result Date: 04/27/2019 Alexis Goodell, MD     04/27/2019  3:15 PM ELECTROENCEPHALOGRAM REPORT Patient: Patricia Wall       Room #: 6P61P Age: 68 y.o.        Sex: female Requesting Physician: Hal Hope Report Date:  04/27/2019       Interpreting Physician: Alexis Goodell History: Patricia Wall is an 69 y.o. female with syncope and altered mental status Medications: Eliquis, ASA, Lipitor, Plavix, Aricept, Cymbalta, Neurontin, Insulin, Femara, Dulera Conditions of Recording:  This is a 21 channel routine scalp EEG performed with bipolar and monopolar montages arranged in accordance to the international 10/20 system of electrode placement. One channel was dedicated to EKG recording. The patient is in the awake, drowsy and asleep states. Description:  The waking background activity consists of a low voltage, symmetrical, fairly well organized, 7 Hz theta activity, seen from the parieto-occipital and posterior temporal regions.  Low voltage fast activity, poorly organized, is seen anteriorly and is at times superimposed on more posterior regions.  A mixture of theta and alpha rhythms are seen from the central and temporal regions. The patient drowses with slowing to irregular, low voltage theta and beta activity.  The patient goes in to a light sleep with symmetrical sleep spindles, vertex central sharp transients and irregular slow activity. No epileptiform activity is noted.  Hyperventilation was not performed.  Intermittent photic stimulation was performed but failed to illicit any change in the tracing.  IMPRESSION: This is an abnormal EEG secondary to posterior background slowing.  This  finding may be seen with a diffuse gray matter disturbance that is etiologically nonspecific, but may  include a dementia, among other possibilities.  No epileptiform activity is noted.  Alexis Goodell, MD Neurology [phone] 04/27/2019, 3:12 PM   DG Chest 2 View  Result Date: 04/17/2019 CLINICAL DATA:  Weakness and chest pain. EXAM: CHEST - 2 VIEW COMPARISON:  April 04, 2019 FINDINGS: The lungs are hyperinflated. Very mild, hazy left basilar atelectasis and/or infiltrate is noted. This represents a new finding when compared to the prior study. There is no evidence of a pleural effusion or pneumothorax. The heart size and mediastinal contours are within normal limits. There is moderate severity calcification of the aortic arch. The visualized skeletal structures are unremarkable. IMPRESSION: 1. New mild, hazy left basilar atelectasis and/or infiltrate. Electronically Signed   By: Virgina Norfolk M.D.   On: 04/17/2019 16:53   CT Head Wo Contrast  Result Date: 04/26/2019 CLINICAL DATA:  Syncope EXAM: CT HEAD WITHOUT CONTRAST TECHNIQUE: Contiguous axial images were obtained from the base of the skull through the vertex without intravenous contrast. COMPARISON:  None. FINDINGS: Brain: No evidence of acute infarction, hemorrhage, hydrocephalus, extra-axial collection or mass lesion/mass effect. Mild periventricular white matter hypodensity. Vascular: No hyperdense vessel or unexpected calcification. Skull: Normal. Negative for fracture or focal lesion. Sinuses/Orbits: No acute finding. Other: None. IMPRESSION: No acute intracranial pathology.  Small-vessel white matter disease. Electronically Signed   By: Eddie Candle M.D.   On: 04/26/2019 20:47   CARDIAC CATHETERIZATION  Result Date: 04/22/2019  2nd Mrg lesion is 20% stenosed.  Previously placed Mid Cx to Dist Cx stent (unknown type) is widely patent.  Prox LAD to Mid LAD lesion is 90% stenosed.  Post intervention, there is a 0% residual stenosis.  A  stent was successfully placed.  There is 90% eccentric in-stent restenosis in the distal proximal LAD Cypher stent which was placed in 2005 in a large LAD system. Patent stents in a dominant left circumflex vessel with 20% intimal hyperplasia in the stent involving the OM1 vessel   and patent stent in the AV groove circumflex. Nondominant normal RCA. LVEDP 2 mm Hg. Successful percutaneous coronary intervention to the previously placed 3.0 x 23 mm Cypher stent in the proximal LAD with 90% in-stent restenosis in the distal aspect of the stent with mild narrowing just beyond the stent treated with multiple cuts with a Wolverine cutting balloon 2.5 x 10 mm, and ultimate stenting with a 3.0 x 18 mm Resolute Onyx stent postdilated to 3.25 mm with the stenosis being reduced to 0%. RECOMMENDATION: DAPT therapy initially with aspirin/Plavix.  If anticoagulation is to be started per primary team due to recent PAF, aspirin can be discontinued after 4 weeks of therapy.   DG Chest Portable 1 View  Result Date: 04/26/2019 CLINICAL DATA:  Syncope. EXAM: PORTABLE CHEST 1 VIEW COMPARISON:  April 23, 2019 FINDINGS: Mild, chronic appearing increased interstitial lung markings are seen. There is no evidence of acute infiltrate, pleural effusion or pneumothorax. The cardiac silhouette is borderline in size. There is moderate severity calcification of the aortic arch. The visualized skeletal structures are unremarkable. IMPRESSION: Chronic appearing increased interstitial lung markings without evidence of acute or active cardiopulmonary disease. Electronically Signed   By: Virgina Norfolk M.D.   On: 04/26/2019 19:51   DG CHEST PORT 1 VIEW  Result Date: 04/23/2019 CLINICAL DATA:  Shortness of breath EXAM: PORTABLE CHEST 1 VIEW COMPARISON:  Yesterday FINDINGS: Normal heart size and mediastinal contours. Interstitial coarsening and apical emphysema. Lung volumes are large. There is no edema, consolidation, effusion, or  pneumothorax. Vague density over the peripheral left chest at at the level of the scapula is not seen on multiple recent comparisons, suspect this is an EKG pad. Coronary stenting. IMPRESSION: 1. No evidence of active disease. 2. Emphysema. Electronically Signed   By: Monte Fantasia M.D.   On: 04/23/2019 06:33   DG CHEST PORT 1 VIEW  Result Date: 04/22/2019 CLINICAL DATA:  Shortness of breath. EXAM: PORTABLE CHEST 1 VIEW COMPARISON:  04/21/2019 FINDINGS: The cardiac silhouette, mediastinal and hilar contours are normal and stable. Stable aortic calcifications. Stable emphysematous changes. Resolving/resolved interstitial edema. No infiltrates or effusions. No pulmonary lesions. The bony thorax is intact. IMPRESSION: No acute cardiopulmonary findings. Stable emphysematous changes. Electronically Signed   By: Marijo Sanes M.D.   On: 04/22/2019 05:48   DG CHEST PORT 1 VIEW  Result Date: 04/21/2019 CLINICAL DATA:  Shortness of breath EXAM: PORTABLE CHEST 1 VIEW COMPARISON:  Chest radiograph 04/20/2019 FINDINGS: Monitoring leads overlie the patient. Stable cardiac and mediastinal contours. Aortic atherosclerosis. Emphysematous change. Similar bilateral mid and lower lung interstitial opacities. No pleural effusion or pneumothorax. IMPRESSION: Similar bilateral mid and lower lung interstitial opacities which may represent edema. Emphysema. Electronically Signed   By: Lovey Newcomer M.D.   On: 04/21/2019 07:17   DG CHEST PORT 1 VIEW  Result Date: 04/20/2019 CLINICAL DATA:  Shortness of breath EXAM: PORTABLE CHEST 1 VIEW COMPARISON:  04/19/2019 FINDINGS: Heart size remains normal. Chronic aortic atherosclerosis. Chronic changes of upper lung predominant emphysema with relative upper lung lucency. Slight increase in interstitial prominence in the lower lungs suggest mild interstitial edema. No alveolar edema. No consolidation, collapse or effusion. IMPRESSION: Slight increased prominence of interstitial markings in  the lower lungs suggests interstitial edema. Background emphysema. Aortic atherosclerosis. Electronically Signed   By: Nelson Chimes M.D.   On: 04/20/2019 07:46   DG CHEST PORT 1 VIEW  Result Date: 04/19/2019 CLINICAL DATA:  Shortness of breath, COPD, BiPAP EXAM: PORTABLE CHEST 1 VIEW COMPARISON:  Radiograph 04/17/2019 FINDINGS: Chronic hyperinflation. Increasing hazy reticular opacities in the lower lungs. Could reflect developing edema given septal thickening and some cephalized vascularity. No pneumothorax or visible effusion. Cardiomediastinal contours are stable from prior with a calcified aorta. Telemetry leads overlie the chest. No acute osseous or soft tissue abnormality. Degenerative changes are present in the imaged spine and shoulders. IMPRESSION: Increasing basilar interstitial opacities, suspect developing edema. Electronically Signed   By: Lovena Le M.D.   On: 04/19/2019 06:36   ECHOCARDIOGRAM COMPLETE  Result Date: 04/19/2019   ECHOCARDIOGRAM REPORT   Patient Name:   Patricia Wall Date of Exam: 04/19/2019 Medical Rec #:  [id number]       Height:       66.0 in Accession #:    [id number]      Weight:       138.4 lb Date of Birth:  [birth year]       BSA:          1.71 m Patient Age:    63 years        BP:           125/61 mmHg Patient Gender: F               HR:           112 bpm. Exam Location:  Inpatient Procedure: 2D Echo, Cardiac Doppler and Color Doppler Indications:    R06.02 SOB  History:        Patient has prior history  of Echocardiogram examinations, most                 recent 01/09/2015. Abnormal ECG, COPD and TIA,                 Signs/Symptoms:Shortness of Breath and Dyspnea; Risk                 Factors:Hypertension and Current Smoker. ETOH. Cyanosis.  Sonographer:    Roseanna Rainbow RDCS Referring Phys: [id number] [facility] LATIF [place]  1. Left ventricular ejection fraction, by visual estimation, is 60 to 65%. The left ventricle has normal function. There is mildly increased left  ventricular hypertrophy.  2. Left ventricular diastolic parameters are consistent with Grade I diastolic dysfunction (impaired relaxation).  3. The left ventricle has no regional wall motion abnormalities.  4. Global right ventricle has normal systolic function.The right ventricular size is normal. No increase in right ventricular wall thickness.  5. Left atrial size was normal.  6. Right atrial size was normal.  7. The mitral valve is normal in structure. No evidence of mitral valve regurgitation.  8. The tricuspid valve is grossly normal.  9. The tricuspid valve is grossly normal. Tricuspid valve regurgitation is trivial. 10. The aortic valve is normal in structure. Aortic valve regurgitation is not visualized. 11. The pulmonic valve was normal in structure. Pulmonic valve regurgitation is not visualized. 12. Moderately elevated pulmonary artery systolic pressure. 13. Pulmonary hypertension is moderate. 14. The atrial septum is grossly normal. FINDINGS  Left Ventricle: Left ventricular ejection fraction, by visual estimation, is 60 to 65%. The left ventricle has normal function. The left ventricle has no regional wall motion abnormalities. There is mildly increased left ventricular hypertrophy. Left ventricular diastolic parameters are consistent with Grade I diastolic dysfunction (impaired relaxation). Right Ventricle: The right ventricular size is normal. No increase in right ventricular wall thickness. Global RV systolic function is has normal systolic function. The tricuspid regurgitant velocity is 2.76 m/s, and with an assumed right atrial pressure  of 15 mmHg, the estimated right ventricular systolic pressure is moderately elevated at 45.5 mmHg. Left Atrium: Left atrial size was normal in size. Right Atrium: Right atrial size was normal in size Pericardium: There is no evidence of pericardial effusion. Mitral Valve: The mitral valve is normal in structure. No evidence of mitral valve regurgitation. Tricuspid  Valve: The tricuspid valve is grossly normal. Tricuspid valve regurgitation is trivial. Aortic Valve: The aortic valve is normal in structure. Aortic valve regurgitation is not visualized. Pulmonic Valve: The pulmonic valve was normal in structure. Pulmonic valve regurgitation is not visualized. Pulmonic regurgitation is not visualized. Aorta: The aortic root and ascending aorta are structurally normal, with no evidence of dilitation. Pulmonary Artery: Pulmonary hypertension is moderate. IAS/Shunts: The atrial septum is grossly normal.  LEFT VENTRICLE PLAX 2D LVIDd:         4.50 cm       Diastology LVIDs:         3.10 cm       LV e' lateral:   5.87 cm/s LV PW:         1.40 cm       LV E/e' lateral: 6.7 LV IVS:        1.20 cm       LV e' medial:    5.44 cm/s LVOT diam:     1.90 cm       LV E/e' medial:  7.2 LV SV:  55 ml LV SV Index:   31.84 LVOT Area:     2.84 cm  LV Volumes (MOD) LV area d, A2C:    18.10 cm LV area d, A4C:    20.00 cm LV area s, A2C:    10.50 cm LV area s, A4C:    10.80 cm LV major d, A2C:   6.61 cm LV major d, A4C:   6.16 cm LV major s, A2C:   5.20 cm LV major s, A4C:   5.33 cm LV vol d, MOD A2C: 41.0 ml LV vol d, MOD A4C: 52.7 ml LV vol s, MOD A2C: 17.2 ml LV vol s, MOD A4C: 18.0 ml LV SV MOD A2C:     23.8 ml LV SV MOD A4C:     52.7 ml LV SV MOD BP:      30.4 ml RIGHT VENTRICLE             IVC RV S prime:     12.10 cm/s  IVC diam: 2.10 cm TAPSE (M-mode): 1.8 cm LEFT ATRIUM             Index LA diam:        2.80 cm 1.64 cm/m LA Vol (A2C):   28.1 ml 16.43 ml/m LA Vol (A4C):   20.6 ml 12.04 ml/m LA Biplane Vol: 23.8 ml 13.91 ml/m  AORTIC VALVE LVOT Vmax:   90.80 cm/s LVOT Vmean:  63.900 cm/s LVOT VTI:    0.153 m  AORTA Ao Root diam: 3.10 cm MITRAL VALVE                        TRICUSPID VALVE MV Area (PHT): 5.27 cm             TR Peak grad:   30.5 mmHg MV PHT:        41.76 msec           TR Vmax:        291.00 cm/s MV Decel Time: 144 msec MV E velocity: 39.34 cm/s 103 cm/s  SHUNTS MV  A velocity: 76.60 cm/s 70.3 cm/s Systemic VTI:  0.15 m MV E/A ratio:  0.51       1.5       Systemic Diam: 1.90 cm  Mertie Moores MD Electronically signed by Mertie Moores MD Signature Date/Time: 04/19/2019/5:07:55 PM    Final    VAS [place] LOWER EXTREMITY VENOUS (DVT)  Result Date: 04/18/2019  Lower Venous Study Indications: Swelling, and Edema.  Risk Factors: History of CAD, PVD and COPD. Performing Technologist: Oda Cogan RDMS, RVT  Examination Guidelines: A complete evaluation includes B-mode imaging, spectral Doppler, color Doppler, and power Doppler as needed of all accessible portions of each vessel. Bilateral testing is considered an integral part of a complete examination. Limited examinations for reoccurring indications may be performed as noted.  +-----+---------------+---------+-----------+----------+--------------+ RIGHTCompressibilityPhasicitySpontaneityPropertiesThrombus Aging +-----+---------------+---------+-----------+----------+--------------+ CFV  Full           Yes      Yes                                 +-----+---------------+---------+-----------+----------+--------------+ SFJ  Full                                                        +-----+---------------+---------+-----------+----------+--------------+   +---------+---------------+---------+-----------+----------+--------------+  LEFT     CompressibilityPhasicitySpontaneityPropertiesThrombus Aging +---------+---------------+---------+-----------+----------+--------------+ CFV      Full           Yes      Yes                                 +---------+---------------+---------+-----------+----------+--------------+ SFJ      Full                                                        +---------+---------------+---------+-----------+----------+--------------+ FV Prox  Full                                                         +---------+---------------+---------+-----------+----------+--------------+ FV Mid   Full                                                        +---------+---------------+---------+-----------+----------+--------------+ FV DistalFull                                                        +---------+---------------+---------+-----------+----------+--------------+ PFV      Full                                                        +---------+---------------+---------+-----------+----------+--------------+ POP      Full           Yes      Yes                                 +---------+---------------+---------+-----------+----------+--------------+ PTV      Full                                                        +---------+---------------+---------+-----------+----------+--------------+ PERO     Full                                                        +---------+---------------+---------+-----------+----------+--------------+     Summary: Right: No evidence of common femoral vein obstruction. Left: There is no evidence of deep vein thrombosis in the lower extremity. No cystic structure found in the popliteal fossa.  *See table(s) above for measurements and observations. Electronically signed by Monica Martinez  MD on 04/18/2019 at 4:12:37 PM.    Final       Discharge Exam: Vitals:   04/27/19 0806 04/27/19 1207  BP:  (!) 123/47  Pulse: 63 76  Resp: 20   Temp:  98 F (36.7 C)  SpO2: 99% 98%   Vitals:   04/27/19 0530 04/27/19 0729 04/27/19 0806 04/27/19 1207  BP: 98/62 115/64  (!) 123/47  Pulse:  66 63 76  Resp:   20   Temp:  97.6 F (36.4 C)  98 F (36.7 C)  TempSrc:      SpO2:  97% 99% 98%  Weight:      Height:        General: Pt is alert, awake, not in acute distress Cardiovascular: RRR, S1/S2 +, no rubs, no gallops Respiratory: Diminished breath sounds at the bases, no wheezing, no rhonchi Abdominal: Soft, NT, ND, bowel sounds  + Extremities: Trace pitting edema, no cyanosis    The results of significant diagnostics from this hospitalization (including imaging, microbiology, ancillary and laboratory) are listed below for reference.     Microbiology: Recent Results (from the past 240 hour(s))  Blood culture (routine x 2)     Status: None   Collection Time: 04/17/19  8:50 PM   Specimen: BLOOD  Result Value Ref Range Status   Specimen Description BLOOD RIGHT ARM  Final   Special Requests   Final    BOTTLES DRAWN AEROBIC AND ANAEROBIC Blood Culture results may not be optimal due to an inadequate volume of blood received in culture bottles   Culture   Final    NO GROWTH 5 DAYS Performed at [facility], [place] [address], [place], [place] [address]    Report Status 04/22/2019 FINAL  Final  Blood culture (routine x 2)     Status: None   Collection Time: 04/17/19  9:07 PM   Specimen: BLOOD  Result Value Ref Range Status   Specimen Description BLOOD RIGHT FOREARM  Final   Special Requests   Final    BOTTLES DRAWN AEROBIC AND ANAEROBIC Blood Culture adequate volume   Culture   Final    NO GROWTH 5 DAYS Performed at [facility], [place] [address], [place],  [address]    Report Status 04/22/2019 FINAL  Final  Respiratory Panel by RT PCR (Flu A&B, Covid) - Nasopharyngeal Swab     Status: None   Collection Time: 04/17/19  9:40 PM   Specimen: Nasopharyngeal Swab  Result Value Ref Range Status   SARS Coronavirus 2 by RT PCR NEGATIVE NEGATIVE Final    Comment: (NOTE) SARS-CoV-2 target nucleic acids are NOT DETECTED. The SARS-CoV-2 RNA is generally detectable in upper respiratoy specimens during the acute phase of infection. The lowest concentration of SARS-CoV-2 viral copies this assay can detect is 131 copies/mL. A negative result does not preclude SARS-Cov-2 infection and should not be used as the sole basis for treatment or other patient management decisions. A negative result may occur with   improper specimen collection/handling, submission of specimen other than nasopharyngeal swab, presence of viral mutation(s) within the areas targeted by this assay, and inadequate number of viral copies (<131 copies/mL). A negative result must be combined with clinical observations, patient history, and epidemiological information. The expected result is Negative. Fact Sheet for Patients:  PinkCheek.be Fact Sheet for Healthcare Providers:  GravelBags.it This test is not yet ap proved or cleared by the [place] FDA and  has been authorized for detection  and/or diagnosis of SARS-CoV-2 by FDA under an Emergency Use Authorization (EUA). This EUA will remain  in effect (meaning this test can be used) for the duration of the COVID-19 declaration under Section 564(b)(1) of the Act, 21 U.S.C. section 360bbb-3(b)(1), unless the authorization is terminated or revoked sooner.    Influenza A by PCR NEGATIVE NEGATIVE Final   Influenza B by PCR NEGATIVE NEGATIVE Final    Comment: (NOTE) The Xpert Xpress SARS-CoV-2/FLU/RSV assay is intended as an aid in  the diagnosis of influenza from Nasopharyngeal swab specimens and  should not be used as a sole basis for treatment. Nasal washings and  aspirates are unacceptable for Xpert Xpress SARS-CoV-2/FLU/RSV  testing. Fact Sheet for Patients: PinkCheek.be Fact Sheet for Healthcare Providers: GravelBags.it This test is not yet approved or cleared by the [place] FDA and  has been authorized for detection and/or diagnosis of SARS-CoV-2 by  FDA under an Emergency Use Authorization (EUA). This EUA will remain  in effect (meaning this test can be used) for the duration of the  Covid-19 declaration under Section 564(b)(1) of the Act, 21  U.S.C. section 360bbb-3(b)(1), unless the authorization is  terminated or revoked. Performed at  [facility], [place] [address] [place], [place] [address]   SARS CORONAVIRUS 2 (TAT 6-24 HRS) Nasopharyngeal Nasopharyngeal Swab     Status: None   Collection Time: 04/18/19 12:35 AM   Specimen: Nasopharyngeal Swab  Result Value Ref Range Status   SARS Coronavirus 2 NEGATIVE NEGATIVE Final    Comment: (NOTE) SARS-CoV-2 target nucleic acids are NOT DETECTED. The SARS-CoV-2 RNA is generally detectable in upper and lower respiratory specimens during the acute phase of infection. Negative results do not preclude SARS-CoV-2 infection, do not rule out co-infections with other pathogens, and should not be used as the sole basis for treatment or other patient management decisions. Negative results must be combined with clinical observations, patient history, and epidemiological information. The expected result is Negative. Fact Sheet for Patients: SugarRoll.be Fact Sheet for Healthcare Providers: https://www.woods-mathews.com/ This test is not yet approved or cleared by the [place] FDA and  has been authorized for detection and/or diagnosis of SARS-CoV-2 by FDA under an Emergency Use Authorization (EUA). This EUA will remain  in effect (meaning this test can be used) for the duration of the COVID-19 declaration under Section 56 4(b)(1) of the Act, 21 U.S.C. section 360bbb-3(b)(1), unless the authorization is terminated or revoked sooner. Performed at [facility], [place] [address] [place], [place] [address]   MRSA PCR Screening     Status: None   Collection Time: 04/20/19  8:21 AM   Specimen: Nasal Swab; Nasopharyngeal  Result Value Ref Range Status   MRSA by PCR NEGATIVE NEGATIVE Final    Comment:        The GeneXpert MRSA Assay (FDA approved for NASAL specimens only), is one component of a comprehensive MRSA colonization surveillance program. It is not intended to diagnose MRSA infection nor to guide or monitor treatment  for MRSA infections. Performed at [facility], [place] [address], [place], [place] [address]   SARS CORONAVIRUS 2 (TAT 6-24 HRS) Nasopharyngeal Nasopharyngeal Swab     Status: None   Collection Time: 04/26/19  8:35 PM   Specimen: Nasopharyngeal Swab  Result Value Ref Range Status   SARS Coronavirus 2 NEGATIVE NEGATIVE Final    Comment: (NOTE) SARS-CoV-2 target nucleic acids are NOT DETECTED. The SARS-CoV-2 RNA is generally detectable in upper and  lower respiratory specimens during the acute phase of infection. Negative results do not preclude SARS-CoV-2 infection, do not rule out co-infections with other pathogens, and should not be used as the sole basis for treatment or other patient management decisions. Negative results must be combined with clinical observations, patient history, and epidemiological information. The expected result is Negative. Fact Sheet for Patients: SugarRoll.be Fact Sheet for Healthcare Providers: https://www.woods-mathews.com/ This test is not yet approved or cleared by the [place] FDA and  has been authorized for detection and/or diagnosis of SARS-CoV-2 by FDA under an Emergency Use Authorization (EUA). This EUA will remain  in effect (meaning this test can be used) for the duration of the COVID-19 declaration under Section 56 4(b)(1) of the Act, 21 U.S.C. section 360bbb-3(b)(1), unless the authorization is terminated or revoked sooner. Performed at [facility], [place] [address], [place], [place] [address]      Labs: BNP (last 3 results) Recent Labs    04/17/19 2059 04/19/19 0702 04/26/19 1824  BNP 187.1* 212.2* 287.8*   Basic Metabolic Panel: Recent Labs  Lab 04/21/19 0224 04/21/19 0224 04/22/19 0248 04/23/19 0218 04/26/19 1823 04/26/19 1859 04/27/19 0103  NA 133*   < > 135 139 138 134* 137  K 3.7   < > 3.7 3.6 4.7 4.4 4.6  CL 87*  --  89* 95* 98  --  91*  CO2 35*  --  33* 36*  30  --  37*  GLUCOSE 322*  --  209* 144* 80  --  141*  BUN 28*  --  29* 20 44*  --  40*  CREATININE 0.88  --  0.77 0.74 1.33*  --  1.17*  CALCIUM 8.3*  --  8.4* 8.3* 8.9  --  9.3  MG 2.3  --  1.9 1.8 1.6*  --  2.1  PHOS 2.0*  --  2.3* 3.9  --   --   --    < > = values in this interval not displayed.   Liver Function Tests: Recent Labs  Lab 04/21/19 0224 04/22/19 0248 04/23/19 0218 04/26/19 1823 04/27/19 0103  AST 16 18 25 18 21   ALT 23 22 30 26 28   ALKPHOS 85 96 104 88 87  BILITOT 0.9 0.6 0.6 0.4 0.6  PROT 5.7* 5.6* 5.5* 5.3* 5.5*  ALBUMIN 2.7* 2.8* 2.7* 2.9* 2.9*   No results for input(s): LIPASE, AMYLASE in the last 168 hours. No results for input(s): AMMONIA in the last 168 hours. CBC: Recent Labs  Lab 04/21/19 0224 04/21/19 0224 04/22/19 0248 04/23/19 0218 04/26/19 1823 04/26/19 1859 04/27/19 0103  WBC 9.2  --  10.1 7.4 13.1*  --  12.0*  NEUTROABS 7.2  --  7.4 4.4 9.2*  --  6.5  HGB 9.6*   < > 9.9* 10.9* 9.5* 9.9* 10.1*  HCT 30.2*   < > 31.6* 34.4* 32.3* 29.0* 32.7*  MCV 87.3  --  89.3 89.1 95.8  --  93.2  PLT 165  --  189 184 288  --  293   < > = values in this interval not displayed.   Cardiac Enzymes: No results for input(s): CKTOTAL, CKMB, CKMBINDEX, TROPONINI in the last 168 hours. BNP: Invalid input(s): POCBNP CBG: Recent Labs  Lab 04/23/19 0825 04/23/19 1153 04/26/19 1823 04/26/19 2305 04/27/19 0614  GLUCAP 149* 203* 77 111* 88   D-Dimer No results for input(s): DDIMER in the last 72 hours. Hgb A1c No results for input(s): HGBA1C in  the last 72 hours. Lipid Profile No results for input(s): CHOL, HDL, LDLCALC, TRIG, CHOLHDL, LDLDIRECT in the last 72 hours. Thyroid function studies Recent Labs    04/26/19 1824  TSH 2.564   Anemia work up No results for input(s): VITAMINB12, FOLATE, FERRITIN, TIBC, IRON, RETICCTPCT in the last 72 hours. Urinalysis    Component Value Date/Time   COLORURINE YELLOW 04/26/2019 2030   [place]  04/26/2019 2030   LABSPEC 1.006 04/26/2019 2030   PHURINE 6.0 04/26/2019 2030   GLUCOSEU NEGATIVE 04/26/2019 2030   [place] NEGATIVE 04/26/2019 2030   [place] NEGATIVE 04/26/2019 2030   [place] 04/26/2019 2030   PROTEINUR NEGATIVE 04/26/2019 2030   UROBILINOGEN 1.0 10/10/2009 2052   NITRITE NEGATIVE 04/26/2019 2030   LEUKOCYTESUR MODERATE (A) 04/26/2019 2030   Sepsis Labs Invalid input(s): PROCALCITONIN,  WBC,  LACTICIDVEN Microbiology Recent Results (from the past 240 hour(s))  Blood culture (routine x 2)     Status: None   Collection Time: 04/17/19  8:50 PM   Specimen: BLOOD  Result Value Ref Range Status   Specimen Description BLOOD RIGHT ARM  Final   Special Requests   Final    BOTTLES DRAWN AEROBIC AND ANAEROBIC Blood Culture results may not be optimal due to an inadequate volume of blood received in culture bottles   Culture   Final    NO GROWTH 5 DAYS Performed at [facility], [place] [address], [place], [place] [address]    Report Status 04/22/2019 FINAL  Final  Blood culture (routine x 2)     Status: None   Collection Time: 04/17/19  9:07 PM   Specimen: BLOOD  Result Value Ref Range Status   Specimen Description BLOOD RIGHT FOREARM  Final   Special Requests   Final    BOTTLES DRAWN AEROBIC AND ANAEROBIC Blood Culture adequate volume   Culture   Final    NO GROWTH 5 DAYS Performed at [facility], [place] [address], [place],  [address]    Report Status 04/22/2019 FINAL  Final  Respiratory Panel by RT PCR (Flu A&B, Covid) - Nasopharyngeal Swab     Status: None   Collection Time: 04/17/19  9:40 PM   Specimen: Nasopharyngeal Swab  Result Value Ref Range Status   SARS Coronavirus 2 by RT PCR NEGATIVE NEGATIVE Final    Comment: (NOTE) SARS-CoV-2 target nucleic acids are NOT DETECTED. The SARS-CoV-2 RNA is generally detectable in upper respiratoy specimens during the acute phase of infection. The lowest concentration of SARS-CoV-2 viral  copies this assay can detect is 131 copies/mL. A negative result does not preclude SARS-Cov-2 infection and should not be used as the sole basis for treatment or other patient management decisions. A negative result may occur with  improper specimen collection/handling, submission of specimen other than nasopharyngeal swab, presence of viral mutation(s) within the areas targeted by this assay, and inadequate number of viral copies (<131 copies/mL). A negative result must be combined with clinical observations, patient history, and epidemiological information. The expected result is Negative. Fact Sheet for Patients:  PinkCheek.be Fact Sheet for Healthcare Providers:  GravelBags.it This test is not yet ap proved or cleared by the [place] FDA and  has been authorized for detection and/or diagnosis of SARS-CoV-2 by FDA under an Emergency Use Authorization (EUA). This EUA will remain  in effect (meaning this test can be used) for the duration of the COVID-19 declaration under Section 564(b)(1) of the Act, 21 U.S.C. section 360bbb-3(b)(1),  unless the authorization is terminated or revoked sooner.    Influenza A by PCR NEGATIVE NEGATIVE Final   Influenza B by PCR NEGATIVE NEGATIVE Final    Comment: (NOTE) The Xpert Xpress SARS-CoV-2/FLU/RSV assay is intended as an aid in  the diagnosis of influenza from Nasopharyngeal swab specimens and  should not be used as a sole basis for treatment. Nasal washings and  aspirates are unacceptable for Xpert Xpress SARS-CoV-2/FLU/RSV  testing. Fact Sheet for Patients: PinkCheek.be Fact Sheet for Healthcare Providers: GravelBags.it This test is not yet approved or cleared by the [place] FDA and  has been authorized for detection and/or diagnosis of SARS-CoV-2 by  FDA under an Emergency Use Authorization (EUA). This EUA will  remain  in effect (meaning this test can be used) for the duration of the  Covid-19 declaration under Section 564(b)(1) of the Act, 21  U.S.C. section 360bbb-3(b)(1), unless the authorization is  terminated or revoked. Performed at [facility], [place] [address], [place], [place] [address]   SARS CORONAVIRUS 2 (TAT 6-24 HRS) Nasopharyngeal Nasopharyngeal Swab     Status: None   Collection Time: 04/18/19 12:35 AM   Specimen: Nasopharyngeal Swab  Result Value Ref Range Status   SARS Coronavirus 2 NEGATIVE NEGATIVE Final    Comment: (NOTE) SARS-CoV-2 target nucleic acids are NOT DETECTED. The SARS-CoV-2 RNA is generally detectable in upper and lower respiratory specimens during the acute phase of infection. Negative results do not preclude SARS-CoV-2 infection, do not rule out co-infections with other pathogens, and should not be used as the sole basis for treatment or other patient management decisions. Negative results must be combined with clinical observations, patient history, and epidemiological information. The expected result is Negative. Fact Sheet for Patients: SugarRoll.be Fact Sheet for Healthcare Providers: https://www.woods-mathews.com/ This test is not yet approved or cleared by the [place] FDA and  has been authorized for detection and/or diagnosis of SARS-CoV-2 by FDA under an Emergency Use Authorization (EUA). This EUA will remain  in effect (meaning this test can be used) for the duration of the COVID-19 declaration under Section 56 4(b)(1) of the Act, 21 U.S.C. section 360bbb-3(b)(1), unless the authorization is terminated or revoked sooner. Performed at [facility], [place] [address] [place], [place] [address]   MRSA PCR Screening     Status: None   Collection Time: 04/20/19  8:21 AM   Specimen: Nasal Swab; Nasopharyngeal  Result Value Ref Range Status   MRSA by PCR NEGATIVE NEGATIVE Final    Comment:         The GeneXpert MRSA Assay (FDA approved for NASAL specimens only), is one component of a comprehensive MRSA colonization surveillance program. It is not intended to diagnose MRSA infection nor to guide or monitor treatment for MRSA infections. Performed at [facility], [place] [address] [place], [place] [address]   SARS CORONAVIRUS 2 (TAT 6-24 HRS) Nasopharyngeal Nasopharyngeal Swab     Status: None   Collection Time: 04/26/19  8:35 PM   Specimen: Nasopharyngeal Swab  Result Value Ref Range Status   SARS Coronavirus 2 NEGATIVE NEGATIVE Final    Comment: (NOTE) SARS-CoV-2 target nucleic acids are NOT DETECTED. The SARS-CoV-2 RNA is generally detectable in upper and lower respiratory specimens during the acute phase of infection. Negative results do not preclude SARS-CoV-2 infection, do not rule out co-infections with other pathogens, and should not be used as the sole basis for treatment or other patient management decisions. Negative results  must be combined with clinical observations, patient history, and epidemiological information. The expected result is Negative. Fact Sheet for Patients: SugarRoll.be Fact Sheet for Healthcare Providers: https://www.woods-mathews.com/ This test is not yet approved or cleared by the [place] FDA and  has been authorized for detection and/or diagnosis of SARS-CoV-2 by FDA under an Emergency Use Authorization (EUA). This EUA will remain  in effect (meaning this test can be used) for the duration of the COVID-19 declaration under Section 56 4(b)(1) of the Act, 21 U.S.C. section 360bbb-3(b)(1), unless the authorization is terminated or revoked sooner. Performed at [facility], [place] [address], [place], [place] [address]      Time coordinating discharge: Over 30 minutes  SIGNED:   Darliss Cheney, MD  [facility] 04/27/2019, 3:30 PM  If 7PM-7AM, please contact  night-coverage [email or web address]

## 2019-04-29 ENCOUNTER — Telehealth: Payer: Self-pay | Admitting: Internal Medicine

## 2019-04-29 DIAGNOSIS — J9612 Chronic respiratory failure with hypercapnia: Secondary | ICD-10-CM

## 2019-04-29 DIAGNOSIS — J9611 Chronic respiratory failure with hypoxia: Secondary | ICD-10-CM

## 2019-04-29 MED ORDER — ALBUTEROL SULFATE (2.5 MG/3ML) 0.083% IN NEBU: INHALATION_SOLUTION | RESPIRATORY_TRACT | 2 refills | Status: AC

## 2019-04-29 MED ORDER — ALBUTEROL SULFATE (2.5 MG/3ML) 0.083% IN NEBU: INHALATION_SOLUTION | RESPIRATORY_TRACT | 2 refills | Status: DC

## 2019-04-29 NOTE — Telephone Encounter (Signed)
I sent in t[facility] to [facility] [place]. I called [name] but there was no answer and I couldn't leave message.

## 2019-04-29 NOTE — Telephone Encounter (Signed)
Called and spoke with the pt's daughter  She states that the o2 concentrator is making a beeping sound every 2 hours  I have sent order to DME to have this looked at  Refilled the albuterol nebs per her request  Nothing further needed

## 2019-04-30 NOTE — Telephone Encounter (Signed)
ATC pt's daughter, Leeroy Bock. There was no answer and I could not leave a message due to her voicemail not being set up. We have attempted to contact pt's daughter several times with no success or call back from her. Per triage protocol, message will be closed.

## 2019-05-02 NOTE — Progress Notes (Addendum)
CARDIOLOGY OFFICE NOTE  Date:  05/08/2019    Patricia Wall Date of Birth: [birth year] Medical Record #[id number]  PCP:  Patricia Limbo, MD  Cardiologist:  Patricia Wall  Chief Complaint  Patient presents with   Follow-up    Seen for Patricia Wall    History of Present Illness: Patricia Wall is a 69 y.o. female who presents today for a follow up visit. Seen for Patricia Wall.   She has a history of known CAD with multiple stenting, PVD and progressive COPD on home supplemental O2, 4L with ongoing smoking use.   She had stenting to LAD, distal LCx and OM2 in 2005, in-stent restenosis with balloon in 2006, cath with patent stents in 2011. Had recurrent chest pain and underwent re-stenting to LCx in 2015. PVD hx includes 60-79%LICA stenosis that has not been checked since 2016.Echocardiogramfrom 2016 and EF 65-70% estimated PA 38 mmHg no valve disease.   Last seen by Patricia Wall in February of 2020. Saw Patricia Drown, NP back in August - intermittent chest pain with NTG use noted. She was not interested in further testing at that time given her severe COPD. Also continued to smoke 2 to 3 cigarettes/day. She opted to maintain her current status.   She was admitted to [facility] last month. Did not have records until after her visit with me. She had had PAF and diastolic HF - in the setting of acute hypoxemic respiratory failure - COVID negative.  Decision to start anticoagulation deferred to [place]. She had profound anemia (HGB 6.2)- required transfusion. Balance very poor. Falling. Aspirin and Plavix were restarted at discharge. Social situation quite challenging - she lives with her husband who has had a stroke - daughter here in [place] taking care of her disabled husband. Another daughter with leukemia. She did express desire to remain a full code and wished "for everything to be done to keep her alive". Echo with low normal EF at 50 to 55%, TDS with suboptimal views, moderate TR,  and RV enlargement. She was seen by Patricia Wall due to minimally elevated troponin - no further testing felt to be needed - he felt this was the result of demand ischemia. CT of the chest with small to moderate bilateral effusions and atelectasis. Noted transaminitis - GB US was negative - she has had prior cholecystectomy.   Discussed her situation with Patricia Wall - due to ongoing anemia - we stopped all anticoagulation - no aspirin, no Plavix and no plans to start Eliquis/OAC therapy. She was deemed high risk for further procedures and needed to follow back up with PCP and GI.   Noted that she has had normal EGD and negative stool for blood noted - there was discussion of possible bone marrow biopsy to rule out MDS.    She has had 2 more admissions this month - the first was for more dyspnea and altered mental status (ran out of oxygen) - required BIpap - did have AF with RVR noted - ended up having cath with PCI due to chest pain - now on triple therapy anticoagulation. The second admission was after a witnessed syncopal spell - felt to be due to hypotension - she was treated with IVF. Her Lasix was cut back.   The patient does not have symptoms concerning for COVID-19 infection (fever, chills, cough, or new shortness of breath).   Comes in today. Here with daughter Patricia Wall - she provides much of the history. [name]  does not remember seeing me at all last month. She continues to feel poorly. She is primarily dizzy. She is to see hematology/oncology - this is with [facility] in [place] next week. She is now on Plavix, Eliquis and aspirin. Aspirin to be stopped after 4 weeks of therapy. She has been moved in with [facility] due to all the dizzy spells and Patricia Wall's fear of a fall. She is now walking less - due to dizziness - so basically staying in the wheelchair all the time. Palliative care is to be seeing them - to be getting BP cuff, scales, bedside commode, etc and discuss goals of care. She  does continue to smoke a few cigarettes.   Past Medical History:  Diagnosis Date   Abnormal chest xray    Alcohol abuse    Back pain    CAD (coronary artery disease)     Prev seen by [facility].  Stent to OM and LAD Last cath 6/11 cathed on June 13, Patricia Wall. 2011.  The cardiac catheterization showed 30% and 50% lesions in the mid  and distal LAD, 50% in the OM1, 40% in the OM2 with 20% in-stent  restenosis, 40% in-stent restenosis in the circumflex and no significant  disease in the RCA.  Her EF was normal.  Patricia Wall recommended a trial  of proton pump inhibitors    Chest pain    COPD (chronic obstructive pulmonary disease) ([place])    Cyanosis    DDD (degenerative disc disease)    Depression    Diarrhea    Diverticular disease    GERD (gastroesophageal reflux disease)    H/O: hysterectomy    Hemorrhoids    HTN (hypertension)    IBS (irritable bowel syndrome)    Insomnia    Lymphadenitis    Pharyngitis    PUD (peptic ulcer disease)    PVD (peripheral vascular disease) ([place])    Sinusitis    TIA (transient ischemic attack)    Tobacco abuse    Vertigo     Past Surgical History:  Procedure Laterality Date   ABDOMINAL HYSTERECTOMY     APPENDECTOMY     BREAST LUMPECTOMY Left 2015   CARDIAC CATHETERIZATION  2005, 2006, 2956,2130   CHOLECYSTECTOMY     CORONARY STENT INTERVENTION N/A 04/22/2019   Procedure: CORONARY STENT INTERVENTION;  Surgeon: Patricia Sine, MD;  Location: [facility];  Service: Cardiovascular;  Laterality: N/A;   CORONARY STENT PLACEMENT     Status post balloon angiogram plasty and Cypher stent to the distal    circumflex and OM2 branch   ESOPHAGOGASTRODUODENOSCOPY (EGD) WITH PROPOFOL Left 04/21/2019   Procedure: ESOPHAGOGASTRODUODENOSCOPY (EGD) WITH PROPOFOL;  Surgeon: Patricia Silence, MD;  Location: [facility];  Service: Endoscopy;  Laterality: Left;   Hammertoe repair     Left Bunionectomy     LEFT HEART CATH AND  CORONARY ANGIOGRAPHY N/A 04/22/2019   Procedure: LEFT HEART CATH AND CORONARY ANGIOGRAPHY;  Surgeon: Patricia Sine, MD;  Location: [facility];  Service: Cardiovascular;  Laterality: N/A;   LEFT HEART CATHETERIZATION WITH CORONARY ANGIOGRAM N/A 12/03/2013   Procedure: LEFT HEART CATHETERIZATION WITH CORONARY ANGIOGRAM;  Surgeon: Leonie Man, MD;  Location: [facility] C[facility];  Service: Cardiovascular;  Laterality: N/A;   TUBAL LIGATION       Medications: Current Meds  Medication Sig   acetaminophen (TYLENOL) 325 MG tablet Take 2 tablets (650 mg total) by mouth every 4 (four) hours as needed for headache or mild pain.  acidophilus (RISAQUAD) CAPS capsule Take 1 capsule by mouth 2 (two) times daily.   albuterol (PROVENTIL) (2.5 MG/3ML) 0.083% nebulizer solution USE 1 VIAL IN NEBULIZER EVERY 4 HOURS FOR WHEEZING OR SHORTNESS OF BREATH.   albuterol (VENTOLIN HFA) 108 (90 Base) MCG/ACT inhaler Inhale 2 puffs into the lungs every 6 (six) hours as needed for wheezing or shortness of breath.   apixaban (ELIQUIS) 5 MG TABS tablet Take 1 tablet (5 mg total) by mouth 2 (two) times daily.   aspirin 81 MG chewable tablet Chew 1 tablet (81 mg total) by mouth daily.   atorvastatin (LIPITOR) 80 MG tablet Take 80 mg by mouth daily.   blood glucose meter kit and supplies Dispense based on patient and insurance preference. Use up to four times daily as directed. (FOR ICD-9 250.00, 250.01).   budesonide-formoterol (SYMBICORT) 160-4.5 MCG/ACT inhaler Inhale 2 puffs into the lungs 2 (two) times daily.   butalbital-acetaminophen-caffeine (FIORICET, ESGIC) 50-325-40 MG tablet Take 1-1.5 tablets by mouth every 8 (eight) hours as needed for headache or migraine. 1 - 1 and 1/2 every 8 hours as needed for headache    calcium carbonate (OS-CAL) 600 MG TABS Take 600 mg by mouth every morning.    Cholecalciferol (VITAMIN D) 2000 UNITS tablet Take 2,000 Units by mouth every morning.    clopidogrel (PLAVIX)  75 MG tablet Take 1 tablet (75 mg total) by mouth daily with breakfast.   Dextromethorphan-Guaifenesin (MUCINEX FAST-MAX DM MAX) 5-100 MG/5ML LIQD Take 5 mLs by mouth every 4 (four) hours as needed (cough/ congestiom). 1 tsp every 4 hours as needed for cough/congestion    diltiazem (CARDIZEM CD) 120 MG 24 hr capsule Take 120 mg by mouth daily.    donepezil (ARICEPT) 5 MG tablet Take 5 mg by mouth daily.    DULoxetine (CYMBALTA) 60 MG capsule Take 60 mg by mouth every morning.    Erenumab-aooe (AIMOVIG [place]) Inject 1 Dose into the skin as directed.    gabapentin (NEURONTIN) 300 MG capsule Take 300 mg by mouth 3 (three) times daily.    Lancets (FREESTYLE) lancets Use as instructed   letrozole (FEMARA) 2.5 MG tablet Take 2.5 mg by mouth every morning.    metFORMIN (GLUCOPHAGE) 1000 MG tablet Take 1,000 mg by mouth 2 (two) times daily with a meal.   methocarbamol (ROBAXIN) 750 MG tablet Take 750 mg by mouth every 8 (eight) hours as needed for muscle spasms.   metoprolol succinate (TOPROL-XL) 25 MG 24 hr tablet Take 25 mg by mouth daily.    Multiple Vitamin (MULTIVITAMIN) tablet Take 1 tablet by mouth daily.    nitroGLYCERIN (NITROSTAT) 0.4 MG SL tablet Place 1 tablet (0.4 mg total) under the tongue every 5 (five) minutes as needed for chest pain (may repeat x3).   ondansetron (ZOFRAN) 8 MG tablet Take 8 mg by mouth every 4 (four) hours as needed for nausea.    OXYGEN Use 4L 24/7   pantoprazole (PROTONIX) 40 MG tablet Take 40 mg by mouth daily.   potassium chloride SA (KLOR-CON M20) 20 MEQ tablet Take 20 mEq by mouth daily.   Respiratory Therapy Supplies (FLUTTER) DEVI Use as directed   Tiotropium Bromide Monohydrate (SPIRIVA RESPIMAT) 2.5 MCG/ACT AERS Inhale 2 puffs into the lungs daily.   [DISCONTINUED] furosemide (LASIX) 40 MG tablet Take 1 tablet (40 mg total) by mouth daily.   [DISCONTINUED] losartan (COZAAR) 25 MG tablet Take 25 mg by mouth daily.     Allergies: No Known  Allergies  Social History: The patient  reports that she has quit smoking. She has never used smokeless tobacco. She reports current drug use. She reports that she does not drink alcohol.   Family History: The patient's family history includes Asthma in her sister; Cancer - Other in her father and mother; Coronary artery disease in an other family member; Emphysema in her father; Heart disease in her father and mother; Rheumatologic disease in her mother; Stroke in her sister.   Review of Systems: Please see the history of present illness.   All other systems are reviewed and negative.   Physical Exam: VS:  BP 118/62    Pulse 95    Ht 5' 6"  (1.676 m)    Wt 147 lb (66.7 kg)    SpO2 96%    BMI 23.73 kg/m  .  BMI Body mass index is 23.73 kg/m.  Wt Readings from Last 3 Encounters:  05/08/19 147 lb (66.7 kg)  04/27/19 145 lb (65.8 kg)  04/23/19 135 lb 8 oz (61.5 kg)    General: Alert and in no acute distress. She looks very chronically ill. She is in a wheelchair.  Color is sallow to me.    Cardiac: Regular rate - heart tones are distant. No edema.  Respiratory: Very decreased breath sounds.  Skin: Warm and dry. Color is sallow.  Neuro:  Strength and sensation are intact and no gross focal deficits noted.  Psych: Alert, appropriate and with normal affect.   LABORATORY DATA:  EKG:  EKG is not ordered today.    Lab Results  Component Value Date   WBC 12.0 (H) 04/27/2019   HGB 10.1 (L) 04/27/2019   HCT 32.7 (L) 04/27/2019   PLT 293 04/27/2019   GLUCOSE 141 (H) 04/27/2019   CHOL 154 04/23/2019   TRIG 89 04/23/2019   HDL 51 04/23/2019   LDLCALC 85 04/23/2019   ALT 28 04/27/2019   AST 21 04/27/2019   NA 137 04/27/2019   K 4.6 04/27/2019   CL 91 (L) 04/27/2019   CREATININE 1.17 (H) 04/27/2019   BUN 40 (H) 04/27/2019   CO2 37 (H) 04/27/2019   TSH 2.564 04/26/2019   INR 1.0 12/02/2013   HGBA1C 6.2 (H) 04/21/2019     BNP (last 3 results) Recent Labs    04/17/19 2059  04/19/19 0702 04/26/19 1824  BNP 187.1* 212.2* 110.7*    ProBNP (last 3 results) Recent Labs    04/15/19 1225  PROBNP 711*     Other Studies Reviewed Today:  Echo1/29/21    1. Left ventricular ejection fraction, by visual estimation, is 60 to 65%. The left ventricle has normal function. There is mildly increased left ventricular hypertrophy. 2. Left ventricular diastolic parameters are consistent with Grade I diastolic dysfunction (impaired relaxation). 3. The left ventricle has no regional wall motion abnormalities. 4. Global right ventricle has normal systolic function.The right ventricular size is normal. No increase in right ventricular wall thickness. 5. Left atrial size was normal. 6. Right atrial size was normal. 7. The mitral valve is normal in structure. No evidence of mitral valve regurgitation. 8. The tricuspid valve is grossly normal. 9. The tricuspid valve is grossly normal. Tricuspid valve regurgitation is trivial. 10. The aortic valve is normal in structure. Aortic valve regurgitation is not visualized. 11. The pulmonic valve was normal in structure. Pulmonic valve regurgitation is not visualized. 12. Moderately elevated pulmonary artery systolic pressure. 13. Pulmonary hypertension is moderate. 14. The atrial septum is grossly normal.  CARDIAC  CATH: 04/22/2019  2nd Mrg lesion is 20% stenosed.  Previously placed Mid Cx to Dist Cx stent (unknown type) is widely patent.  Prox LAD to Mid LAD lesion is 90% stenosed.  Post intervention, there is a 0% residual stenosis.  A stent was successfully placed.  There is 90% eccentric in-stent restenosis in the distal proximal LAD Cypher stent which was placed in 2005 in a large LAD system.  Patent stents in a dominant left circumflex vessel with 20% intimal hyperplasia in the stent involving the OM1 vessel and patent stent in the AV groove circumflex.  Nondominant normal RCA.  LVEDP 2 mm  Hg.  Successful percutaneous coronary intervention to the previously placed 3.0 x 23 mm Cypher stent in the proximal LAD with 90% in-stent restenosis in the distal aspect of the stent with mild narrowing just beyond the stent treated with multiple cuts with a Wolverine cutting balloon 2.5 x 10 mm, and ultimate stenting with a 3.0 x 18 mm Resolute Onyx stent postdilated to 3.25 mm with the stenosis being reduced to 0%.  RECOMMENDATION: DAPT therapy initially with aspirin/Plavix. If anticoagulation is to be started per primary team due to recent PAF, aspirin can be discontinued after 4 weeks of therapy.  ASSESSMENT AND PLAN:  1. Multiple recent admissions - she has had respiratory failure, profound anemia, recurrent AF with RVR and chest pain.   2. Chest pain - known CAD with history of multiple PCIs in the past - has new dLAD stent and back on triple therapy at this time.   3. AF with RVR - has been placed on Eliquis - very poor candidate - BP is low - now living with daughter for safety concerns. Hr is fair today - BP too low to titrate her CCB or her beta blocker therapy - I worry this is driven by her anemia.   4. HTN - BP remains low - only 82/60 by me - I am stopping Lasix and Losartan today.   5. COPD - chronic dyspnea - on oxygen therapy.   6. Anemia - unclear etiology - had normal EGD - stool negative for blood - to see hematology next week. [facility] lab today.   7. Diastolic HF - stopping Lasix today due to hypotension and recent events and ongoing dizziness with prior syncope.   8. Carotid disease - missed her last visit for repeat doppler - this was NOT discussed today - her other issues are more pressing in my opinion at this time.   9. HlD - on statin   10. Ongoing tobacco abuse  11. Very high risk therapy - lab today. I agree with Palliative care. I am not sure we will be able to keep her on all of this anticoagulation safely.   12. COVID-19 Education: The signs and  symptoms of COVID-19 were discussed with the patient and how to seek care for testing (follow up with PCP or arrange E-visit).  The importance of social distancing, staying at home, hand hygiene and wearing a mask when out in public were discussed today.  Current medicines are reviewed with the patient today.  The patient does not have concerns regarding medicines other than what has been noted above.  The following changes have been made:  See above.  Labs/ tests ordered today include:    Orders Placed This Encounter  Procedures   Basic metabolic panel   CBC     Disposition:   FU with [place] in a month. I think her  overall situation remains very tenuous with poor prognosis.    Patient is agreeable to this plan and will call if any problems develop in the interim.   SignedTruitt Merle, NP  05/08/2019 11:48 AM  [facility] [address] [place], [place]  [address] Phone: [phone] Fax: [phone]

## 2019-05-08 ENCOUNTER — Other Ambulatory Visit: Payer: Self-pay

## 2019-05-08 ENCOUNTER — Encounter: Payer: Self-pay | Admitting: Nurse Practitioner

## 2019-05-08 ENCOUNTER — Ambulatory Visit (INDEPENDENT_AMBULATORY_CARE_PROVIDER_SITE_OTHER): Payer: Medicare Other | Admitting: Nurse Practitioner

## 2019-05-08 VITALS — BP 118/62 | HR 95 | Ht 66.0 in | Wt 147.0 lb

## 2019-05-08 DIAGNOSIS — I2510 Atherosclerotic heart disease of native coronary artery without angina pectoris: Secondary | ICD-10-CM

## 2019-05-08 DIAGNOSIS — Z955 Presence of coronary angioplasty implant and graft: Secondary | ICD-10-CM

## 2019-05-08 LAB — CBC
Hematocrit: 27 % — ABNORMAL LOW (ref 34.0–46.6)
Hemoglobin: 9 g/dL — ABNORMAL LOW (ref 11.1–15.9)
MCH: 28.8 pg (ref 26.6–33.0)
MCHC: 33.3 g/dL (ref 31.5–35.7)
MCV: 87 fL (ref 79–97)
Platelets: 219 10*3/uL (ref 150–450)
RBC: 3.12 x10E6/uL — ABNORMAL LOW (ref 3.77–5.28)
RDW: 19.2 % — ABNORMAL HIGH (ref 11.7–15.4)
WBC: 11.2 10*3/uL — ABNORMAL HIGH (ref 3.4–10.8)

## 2019-05-08 LAB — BASIC METABOLIC PANEL
BUN/Creatinine Ratio: 32 — ABNORMAL HIGH (ref 12–28)
BUN: 25 mg/dL (ref 8–27)
CO2: 25 mmol/L (ref 20–29)
Calcium: 9.5 mg/dL (ref 8.7–10.3)
Chloride: 97 mmol/L (ref 96–106)
Creatinine, Ser: 0.78 mg/dL (ref 0.57–1.00)
GFR calc Af Amer: 89 mL/min/{1.73_m2} (ref >59)
GFR calc non Af Amer: 77 mL/min/{1.73_m2} (ref >59)
Glucose: 163 mg/dL — ABNORMAL HIGH (ref 65–99)
Potassium: 4.2 mmol/L (ref 3.5–5.2)
Sodium: 137 mmol/L (ref 134–144)

## 2019-05-08 NOTE — Patient Instructions (Addendum)
After Visit Summary:  We will be checking the following labs today - BMET and CBC   Medication Instructions:    Continue with your current medicines. BUT  STOP aspirin as of March 1st.    I am stopping Lasix now  I am stopping Losartan now  Decrease potassium to just one pill a day    If you need a refill on your cardiac medications before your next appointment, please call your pharmacy.     Testing/Procedures To Be Arranged:  N/A  Follow-Up:   See me or Dr. Eden Emms in one month    At [facility], you and your health needs are our priority.  As part of our continuing mission to provide you with exceptional heart care, we have created designated Provider Care Teams.  These Care Teams include your primary Cardiologist (physician) and Advanced Practice Providers (APPs -  Physician Assistants and Nurse Practitioners) who all work together to provide you with the care you need, when you need it.  Special Instructions:  . Stay safe, stay home, wash your hands for at least 20 seconds and wear a mask when out in public.  . It was good to talk with you today.  . Continue to monitor the blood pressure for [place]. Call if staying consistently less than 100 systolic.      Call the [facility] office at [phone] if you have any questions, problems or concerns.

## 2019-05-28 NOTE — Progress Notes (Deleted)
CARDIOLOGY OFFICE NOTE  Date:  05/28/2019    Patricia Wall Date of Birth: February 06, 1951 Medical Record #[id number]  PCP:  Bernerd Limbo, MD  Cardiologist:  Gillian Shields  No chief complaint on file.   History of Present Illness:  69 y.o. seen for f/u CAD and PAF. She is in wheelchair with oxygen dependent COPD but is still smoking    She had stenting to LAD, distal LCx and OM2 in 2005, in-stent restenosis with balloon in 2006, cath with patent stents in 2011. Had recurrent chest pain and underwent re-stenting to LCx in 2015.   She has known RICA stenosis 60-79% last duplex 02/08/18    She was admitted to [facility] January 2021 . Did not have records until after her visit with me. She had had PAF and diastolic HF - in the setting of acute hypoxemic respiratory failure - COVID negative.  Decision to start anticoagulation deferred to [place]. She had profound anemia (HGB 6.2)- required transfusion. Balance very poor. Falling. Aspirin and Plavix were restarted at discharge. Social situation quite challenging - she lives with her husband who has had a stroke - daughter here in [place] taking care of her disabled husband. Another daughter with leukemia. She did express desire to remain a full code and wished "for everything to be done to keep her alive". Echo with low normal EF at 50 to 55%, TDS with suboptimal views, moderate TR, and RV enlargement. She was seen by Dr. Agustin Cree due to minimally elevated troponin - no further testing felt to be needed - he felt this was the result of demand ischemia. CT of the chest with small to moderate bilateral effusions and atelectasis. Noted transaminitis - GB US was negative - she has had prior cholecystectomy.   Her DAT and DOAC were stopped at that time   Noted that she has had normal EGD and negative stool for blood noted - there was discussion of possible bone marrow biopsy to rule out MDS.    She has had 2 more admissions this month - the  first was for more dyspnea and altered mental status (ran out of oxygen) - required BIpap - did have AF with RVR noted - ended up having cath with PCI due to chest pain - now on triple therapy anticoagulation. The second admission was after a witnessed syncopal spell - felt to be due to hypotension - she was treated with IVF. Her Lasix was cut back.   Anemia is significant Hb down to 5 at [facility] January and transfused and has been getting iron infusions Seen by GI and Hematology/Oncology at [facility] and would need to stop DAT for colonoscopy Not clear to me she is healthy enough from pulmonary standpoint and has had stent to LAD on 04/22/19   ***   Past Medical History:  Diagnosis Date  . Abnormal chest xray   . Alcohol abuse   . Back pain   . CAD (coronary artery disease)     Prev seen by [facility].  Stent to OM and LAD Last cath 6/11 cathed on June 13, [name]. 2011.  The cardiac catheterization showed 30% and 50% lesions in the mid  and distal LAD, 50% in the OM1, 40% in the OM2 with 20% in-stent  restenosis, 40% in-stent restenosis in the circumflex and no significant  disease in the RCA.  Her EF was normal.  Dr. Olevia Perches recommended a trial  of proton pump inhibitors   . Chest pain   .  COPD (chronic obstructive pulmonary disease) ([place])   . Cyanosis   . DDD (degenerative disc disease)   . Depression   . Diarrhea   . Diverticular disease   . GERD (gastroesophageal reflux disease)   . H/O: hysterectomy   . Hemorrhoids   . HTN (hypertension)   . IBS (irritable bowel syndrome)   . Insomnia   . Lymphadenitis   . Pharyngitis   . PUD (peptic ulcer disease)   . PVD (peripheral vascular disease) ([place])   . Sinusitis   . TIA (transient ischemic attack)   . Tobacco abuse   . Vertigo     Past Surgical History:  Procedure Laterality Date  . ABDOMINAL HYSTERECTOMY    . APPENDECTOMY    . BREAST LUMPECTOMY Left 2015  . CARDIAC CATHETERIZATION  2005, 2006, 2009,2011  . CHOLECYSTECTOMY    .  CORONARY STENT INTERVENTION N/A 04/22/2019   Procedure: CORONARY STENT INTERVENTION;  Surgeon: Troy Sine, MD;  Location: [place] [facility];  Service: Cardiovascular;  Laterality: N/A;  . CORONARY STENT PLACEMENT     Status post balloon angiogram plasty and Cypher stent to the distal    circumflex and OM2 branch  . ESOPHAGOGASTRODUODENOSCOPY (EGD) WITH PROPOFOL Left 04/21/2019   Procedure: ESOPHAGOGASTRODUODENOSCOPY (EGD) WITH PROPOFOL;  Surgeon: Arta Silence, MD;  Location: [place];  Service: Endoscopy;  Laterality: Left;  . Hammertoe repair    . Left Bunionectomy    . LEFT HEART CATH AND CORONARY ANGIOGRAPHY N/A 04/22/2019   Procedure: LEFT HEART CATH AND CORONARY ANGIOGRAPHY;  Surgeon: Troy Sine, MD;  Location: [place] [facility];  Service: Cardiovascular;  Laterality: N/A;  . LEFT HEART CATHETERIZATION WITH CORONARY ANGIOGRAM N/A 12/03/2013   Procedure: LEFT HEART CATHETERIZATION WITH CORONARY ANGIOGRAM;  Surgeon: Leonie Man, MD;  Location: [facility];  Service: Cardiovascular;  Laterality: N/A;  . TUBAL LIGATION       Medications: No outpatient medications have been marked as taking for the 06/11/19 encounter (Appointment) with Josue Hector, MD.    Allergies: No Known Allergies  Social History: The patient  reports that she has quit smoking. She has never used smokeless tobacco. She reports current drug use. She reports that she does not drink alcohol.   Family History: The patient's family history includes Asthma in her sister; Cancer - Other in her father and mother; Coronary artery disease in an other family member; Emphysema in her father; Heart disease in her father and mother; Rheumatologic disease in her mother; Stroke in her sister.   Review of Systems: Please see the history of present illness.   All other systems are reviewed and negative.   Physical Exam: VS:  There were no vitals taken for this visit. [name]  BMI There is no height or weight on file  to calculate BMI.  Wt Readings from Last 3 Encounters:  05/08/19 147 lb (66.7 kg)  04/27/19 145 lb (65.8 kg)  04/23/19 135 lb 8 oz (61.5 kg)   Affect appropriate Chronically ill female  HEENT: normal Neck supple with no adenopathy JVP normal no bruits no thyromegaly Lungs clear with no wheezing and good diaphragmatic motion Heart:  S1/S2 no murmur, no rub, gallop or click PMI normal Abdomen: benighn, BS positve, no tenderness, no AAA no bruit.  No HSM or HJR Distal pulses intact with no bruits No edema Neuro non-focal Skin warm and dry No muscular weakness   LABORATORY DATA:  EKG:  04/26/19 SR rate 64  Lab Results  Component Value Date   WBC 11.2 (H) 05/08/2019   HGB 9.0 (L) 05/08/2019   HCT 27.0 (L) 05/08/2019   PLT 219 05/08/2019   GLUCOSE 163 (H) 05/08/2019   CHOL 154 04/23/2019   TRIG 89 04/23/2019   HDL 51 04/23/2019   LDLCALC 85 04/23/2019   ALT 28 04/27/2019   AST 21 04/27/2019   NA 137 05/08/2019   K 4.2 05/08/2019   CL 97 05/08/2019   CREATININE 0.78 05/08/2019   BUN 25 05/08/2019   CO2 25 05/08/2019   TSH 2.564 04/26/2019   INR 1.0 12/02/2013   HGBA1C 6.2 (H) 04/21/2019     BNP (last 3 results) Recent Labs    04/17/19 2059 04/19/19 0702 04/26/19 1824  BNP 187.1* 212.2* 110.7*    ProBNP (last 3 results) Recent Labs    04/15/19 1225  PROBNP 711*     Other Studies Reviewed Today:  Echo1/29/21    1. Left ventricular ejection fraction, by visual estimation, is 60 to 65%. The left ventricle has normal function. There is mildly increased left ventricular hypertrophy. 2. Left ventricular diastolic parameters are consistent with Grade I diastolic dysfunction (impaired relaxation). 3. The left ventricle has no regional wall motion abnormalities. 4. Global right ventricle has normal systolic function.The right ventricular size is normal. No increase in right ventricular wall thickness. 5. Left atrial size was normal. 6. Right atrial size  was normal. 7. The mitral valve is normal in structure. No evidence of mitral valve regurgitation. 8. The tricuspid valve is grossly normal. 9. The tricuspid valve is grossly normal. Tricuspid valve regurgitation is trivial. 10. The aortic valve is normal in structure. Aortic valve regurgitation is not visualized. 11. The pulmonic valve was normal in structure. Pulmonic valve regurgitation is not visualized. 12. Moderately elevated pulmonary artery systolic pressure. 13. Pulmonary hypertension is moderate. 14. The atrial septum is grossly normal.  CARDIAC CATH: 04/22/2019  2nd Mrg lesion is 20% stenosed.  Previously placed Mid Cx to Dist Cx stent (unknown type) is widely patent.  Prox LAD to Mid LAD lesion is 90% stenosed.  Post intervention, there is a 0% residual stenosis.  A stent was successfully placed.  There is 90% eccentric in-stent restenosis in the distal proximal LAD Cypher stent which was placed in 2005 in a large LAD system.  Patent stents in a dominant left circumflex vessel with 20% intimal hyperplasia in the stent involving the OM1 vessel and patent stent in the AV groove circumflex.  Nondominant normal RCA.  LVEDP 2 mm Hg.  Successful percutaneous coronary intervention to the previously placed 3.0 x 23 mm Cypher stent in the proximal LAD with 90% in-stent restenosis in the distal aspect of the stent with mild narrowing just beyond the stent treated with multiple cuts with a Wolverine cutting balloon 2.5 x 10 mm, and ultimate stenting with a 3.0 x 18 mm Resolute Onyx stent postdilated to 3.25 mm with the stenosis being reduced to 0%.  RECOMMENDATION: DAPT therapy initially with aspirin/Plavix. If anticoagulation is to be started per primary team due to recent PAF, aspirin can be discontinued after 4 weeks of therapy.  ASSESSMENT AND PLAN:  1. COPD:  DNI / palliative care per primary and pulmonary still smoking  On 4 L oxygen   2. CAD:  Cath 04/22/19  patent stents to circumflex/OM1 new stent to proximal LAD completely revascularized continue DAT   3. AF with RVR -  ***  4. HTN - been low with  FTT ARB and lasix d/c    5. Anemia - unclear etiology - had normal EGD - stool negative for blood - seen by GI at [facility] Do not think she is a good candidate for colonoscopy may consider virtual colonic CT    6. Carotid disease - 60-79% RICA by duplex 02/08/18 given other comorbid conditions will not repeat   7. HlD - on statin    12. COVID-19 Education: The signs and symptoms of COVID-19 were discussed with the patient and how to seek care for testing (follow up with PCP or arrange E-visit).  The importance of social distancing, staying at home, hand hygiene and wearing a mask when out in public were discussed today.  Current medicines are reviewed with the patient today.  The patient does not have concerns regarding medicines other than what has been noted above.  The following changes have been made:  See above.  Labs/ tests ordered today include:   ***  No orders of the defined types were placed in this encounter.    Disposition:   FU  ***   Patient is agreeable to this plan and will call if any problems develop in the interim.   Signed: Jenkins Rouge, MD  05/28/2019 4:44 PM  [facility] [address] [place], [place]  [address] Phone: [phone] Fax: [phone]

## 2019-05-30 ENCOUNTER — Other Ambulatory Visit: Payer: Self-pay

## 2019-05-30 MED ORDER — APIXABAN 5 MG PO TABS: 5.0000 mg | ORAL_TABLET | Freq: Two times a day (BID) | ORAL | 5 refills | Status: DC

## 2019-05-30 MED ORDER — CLOPIDOGREL BISULFATE 75 MG PO TABS: 75.0000 mg | ORAL_TABLET | Freq: Every day | ORAL | 3 refills | Status: DC

## 2019-05-30 NOTE — Telephone Encounter (Signed)
Prescription refill request for Eliquis received.  Last office visit: Patricia Wall 05/08/2019 Scr: 0.78, 05/08/2019 Age: 69 y.o. Weight: 66.7 kg   Prescription refill sent.

## 2019-06-08 ENCOUNTER — Encounter (HOSPITAL_COMMUNITY): Payer: Self-pay | Admitting: *Deleted

## 2019-06-08 ENCOUNTER — Other Ambulatory Visit: Payer: Self-pay

## 2019-06-08 ENCOUNTER — Emergency Department (HOSPITAL_COMMUNITY): Payer: Medicare Other

## 2019-06-08 ENCOUNTER — Emergency Department (HOSPITAL_COMMUNITY)
Admission: EM | Admit: 2019-06-08 | Discharge: 2019-06-08 | Disposition: A | Discharge status: Home / Self Care | Payer: Medicare Other | Attending: Emergency Medicine | Admitting: Emergency Medicine

## 2019-06-08 DIAGNOSIS — Z79899 Other long term (current) drug therapy: Secondary | ICD-10-CM | POA: Insufficient documentation

## 2019-06-08 DIAGNOSIS — I2510 Atherosclerotic heart disease of native coronary artery without angina pectoris: Secondary | ICD-10-CM | POA: Diagnosis not present

## 2019-06-08 DIAGNOSIS — Z7984 Long term (current) use of oral hypoglycemic drugs: Secondary | ICD-10-CM | POA: Insufficient documentation

## 2019-06-08 DIAGNOSIS — Z7901 Long term (current) use of anticoagulants: Secondary | ICD-10-CM | POA: Diagnosis not present

## 2019-06-08 DIAGNOSIS — J449 Chronic obstructive pulmonary disease, unspecified: Secondary | ICD-10-CM | POA: Diagnosis not present

## 2019-06-08 DIAGNOSIS — Z7982 Long term (current) use of aspirin: Secondary | ICD-10-CM | POA: Insufficient documentation

## 2019-06-08 DIAGNOSIS — E119 Type 2 diabetes mellitus without complications: Secondary | ICD-10-CM | POA: Diagnosis not present

## 2019-06-08 DIAGNOSIS — I110 Hypertensive heart disease with heart failure: Secondary | ICD-10-CM | POA: Insufficient documentation

## 2019-06-08 DIAGNOSIS — Z955 Presence of coronary angioplasty implant and graft: Secondary | ICD-10-CM | POA: Diagnosis not present

## 2019-06-08 DIAGNOSIS — Z7902 Long term (current) use of antithrombotics/antiplatelets: Secondary | ICD-10-CM | POA: Insufficient documentation

## 2019-06-08 DIAGNOSIS — R1030 Lower abdominal pain, unspecified: Secondary | ICD-10-CM | POA: Insufficient documentation

## 2019-06-08 DIAGNOSIS — I5030 Unspecified diastolic (congestive) heart failure: Secondary | ICD-10-CM | POA: Diagnosis not present

## 2019-06-08 DIAGNOSIS — R0602 Shortness of breath: Secondary | ICD-10-CM | POA: Diagnosis present

## 2019-06-08 DIAGNOSIS — Z87891 Personal history of nicotine dependence: Secondary | ICD-10-CM | POA: Insufficient documentation

## 2019-06-08 DIAGNOSIS — R1084 Generalized abdominal pain: Secondary | ICD-10-CM

## 2019-06-08 LAB — URINALYSIS, ROUTINE W REFLEX MICROSCOPIC
Bacteria, UA: NONE SEEN
Bilirubin Urine: NEGATIVE
Glucose, UA: NEGATIVE mg/dL
Hgb urine dipstick: NEGATIVE
Ketones, ur: NEGATIVE mg/dL
Leukocytes,Ua: NEGATIVE
Nitrite: NEGATIVE
Protein, ur: 30 mg/dL — AB
Specific Gravity, Urine: 1.018 (ref 1.005–1.030)
pH: 5 (ref 5.0–8.0)

## 2019-06-08 LAB — HEPATIC FUNCTION PANEL
ALT: 45 U/L — ABNORMAL HIGH (ref 0–44)
AST: 34 U/L (ref 15–41)
Albumin: 3.5 g/dL (ref 3.5–5.0)
Alkaline Phosphatase: 92 U/L (ref 38–126)
Bilirubin, Direct: 0.1 mg/dL (ref 0.0–0.2)
Indirect Bilirubin: 0.4 mg/dL (ref 0.3–0.9)
Total Bilirubin: 0.5 mg/dL (ref 0.3–1.2)
Total Protein: 6.9 g/dL (ref 6.5–8.1)

## 2019-06-08 LAB — BASIC METABOLIC PANEL
Anion gap: 9 (ref 5–15)
BUN: 14 mg/dL (ref 8–23)
CO2: 32 mmol/L (ref 22–32)
Calcium: 9.4 mg/dL (ref 8.9–10.3)
Chloride: 96 mmol/L — ABNORMAL LOW (ref 98–111)
Creatinine, Ser: 0.74 mg/dL (ref 0.44–1.00)
GFR calc Af Amer: >60 mL/min (ref >60)
GFR calc non Af Amer: >60 mL/min (ref >60)
Glucose, Bld: 155 mg/dL — ABNORMAL HIGH (ref 70–99)
Potassium: 4.8 mmol/L (ref 3.5–5.1)
Sodium: 137 mmol/L (ref 135–145)

## 2019-06-08 LAB — CBC
HCT: 31.6 % — ABNORMAL LOW (ref 36.0–46.0)
Hemoglobin: 9.4 g/dL — ABNORMAL LOW (ref 12.0–15.0)
MCH: 29.3 pg (ref 26.0–34.0)
MCHC: 29.7 g/dL — ABNORMAL LOW (ref 30.0–36.0)
MCV: 98.4 fL (ref 80.0–100.0)
Platelets: 265 10*3/uL (ref 150–400)
RBC: 3.21 MIL/uL — ABNORMAL LOW (ref 3.87–5.11)
RDW: 16.2 % — ABNORMAL HIGH (ref 11.5–15.5)
WBC: 9 10*3/uL (ref 4.0–10.5)
nRBC: 0 % (ref 0.0–0.2)

## 2019-06-08 LAB — LIPASE, BLOOD: Lipase: 24 U/L (ref 11–51)

## 2019-06-08 LAB — TROPONIN I (HIGH SENSITIVITY)
Troponin I (High Sensitivity): 9 ng/L (ref <18)
Troponin I (High Sensitivity): 9 ng/L (ref <18)

## 2019-06-08 LAB — BRAIN NATRIURETIC PEPTIDE: B Natriuretic Peptide: 256 pg/mL — ABNORMAL HIGH (ref 0.0–100.0)

## 2019-06-08 MED ORDER — PREDNISONE 20 MG PO TABS: 40.0000 mg | ORAL_TABLET | Freq: Every day | ORAL | 0 refills | Status: DC

## 2019-06-08 MED ORDER — IOHEXOL 350 MG/ML SOLN
100.0000 mL | Freq: Once | INTRAVENOUS | Status: AC | PRN
  Administered 2019-06-08: 100 mL via INTRAVENOUS

## 2019-06-08 MED ORDER — DOXYCYCLINE HYCLATE 100 MG PO CAPS: 100.0000 mg | ORAL_CAPSULE | Freq: Two times a day (BID) | ORAL | 0 refills | Status: DC

## 2019-06-08 NOTE — ED Triage Notes (Signed)
Pt arrives from home with c/o abdominal pain that has been ongoing. Also SOB. She has been eval by her provider for the same previously. Hx of COPD  89-90% on 4 liters (chronic O2@home ). NRB 100%. Zofran and Solumedrol given en route. IV established in the right forearm. CBG 239.

## 2019-06-08 NOTE — ED Notes (Signed)
PIVC placed on the RAC with a 18G which had positive blood return and flushed without pain or infiltration.

## 2019-06-08 NOTE — ED Notes (Signed)
Pt resting in bed. Pt denies new or worsening complaints. Will continue to monitor. No distress noted. Pt on continuous monitoring via blood pressure, pulse ox, and cardiac monitor.  

## 2019-06-08 NOTE — ED Triage Notes (Signed)
Pt c/o abdominal pain and worsening SOB over the past couple of days. Using home nebs without relief. Wheezing in triage.

## 2019-06-08 NOTE — ED Notes (Signed)
PT TO CT

## 2019-06-08 NOTE — ED Provider Notes (Signed)
[facility] EMERGENCY DEPARTMENT Provider Note   CSN: [id number] Arrival date & time: 06/08/19  0220     History No chief complaint on file.   Patricia Wall is a 69 y.o. female.  Patient presents to the emergency department for evaluation of multiple problems.  Patient complains of abdominal pain.  This has been ongoing for a long time, she cannot tell me when it started.  Patient complains of diffuse lower abdominal pain.  No associated fever, nausea or vomiting.  Patient also complaining of shortness of breath.  She has a history of COPD, chronic respiratory failure requiring 4 L nasal cannula continuously.  No cough, no fever, no chest pain.        Past Medical History:  Diagnosis Date  . Abnormal chest xray   . Alcohol abuse   . Back pain   . CAD (coronary artery disease)     Prev seen by [facility].  Stent to OM and LAD Last cath 6/11 cathed on June 13, [name]. 2011.  The cardiac catheterization showed 30% and 50% lesions in the mid  and distal LAD, 50% in the OM1, 40% in the OM2 with 20% in-stent  restenosis, 40% in-stent restenosis in the circumflex and no significant  disease in the RCA.  Her EF was normal.  Dr. Olevia Perches recommended a trial  of proton pump inhibitors   . Chest pain   . COPD (chronic obstructive pulmonary disease) ([place])   . Cyanosis   . DDD (degenerative disc disease)   . Depression   . Diarrhea   . Diverticular disease   . GERD (gastroesophageal reflux disease)   . H/O: hysterectomy   . Hemorrhoids   . HTN (hypertension)   . IBS (irritable bowel syndrome)   . Insomnia   . Lymphadenitis   . Pharyngitis   . PUD (peptic ulcer disease)   . PVD (peripheral vascular disease) ([place])   . Sinusitis   . TIA (transient ischemic attack)   . Tobacco abuse   . Vertigo     Patient Active Problem List   Diagnosis Date Noted  . ARF (acute renal failure) ([place]) 04/27/2019  . Hypotension 04/27/2019  . Diabetes mellitus type 2 in nonobese ([place])  04/27/2019  . Pressure injury of skin 04/23/2019  . Acute on chronic respiratory failure with hypercapnia ([place])   . SOB (shortness of breath)   . Acute diastolic CHF (congestive heart failure) ([place])   . Exertional angina (HCC)   . Atrial fibrillation with RVR ([place])   . Anemia 04/18/2019  . Thrombocytopenia ([place]) 04/18/2019  . AF (paroxysmal atrial fibrillation) ([place]) 04/18/2019  . Chronic respiratory failure with hypoxia and hypercapnia ([place]) 03/29/2018  . Acute respiratory failure with hypoxia ([place]) 01/07/2015  . CAD (coronary artery disease) 01/07/2015  . Obesity 11/01/2014  . COPD exacerbation ([place]) 12/30/2013  . Chest pain 12/03/2013  . Atherosclerotic heart disease of native coronary artery with angina pectoris ([place]) 12/03/2013    Class: Diagnosis of  . Syncope 12/02/2013  . Dyspnea 10/02/2013  . Acute upper respiratory infections of unspecified site 04/03/2013  . Right carotid bruit 11/15/2012  . Peripheral vascular disease ([place]) 09/30/2009  . Nonspecific (abnormal) findings on radiological and other examination of body structure 09/02/2009  . ABNORMAL CHEST XRAY 09/02/2009  . TINNITUS 04/15/2008  . COUGH 04/15/2008  . VERTIGO 02/04/2008  . PHARYNGITIS 07/18/2007  . LYMPHADENITIS, CERVICAL 07/18/2007  . Angina, class III ([place]) 03/29/2007  . CONDYLOMA ACUMINATUM 03/01/2007  .  SINUSITIS, ACUTE 03/01/2007  . INSOMNIA 01/30/2007  . Elevated lipids 09/04/2006  . ABUSE, ALCOHOL, UNSPECIFIED 09/04/2006  . Cigarette smoker 09/04/2006  . DEPRESSION 09/04/2006  . Essential hypertension 09/04/2006  . CAD S/P percutaneous coronary angioplasty: Prior Cypher DES to pLAD, pOM2 & AVG Cx (after Om2); New DES to AVG Cx ISR & Angiosculpt PTCA of Ostial AVG Cx from OM2. 09/04/2006  . HEMORRHOIDS 09/04/2006  . COPD GOLD II / still smoking  09/04/2006  . GERD 09/04/2006  . PEPTIC ULCER DISEASE 09/04/2006  . DIVERTICULOSIS, COLON 09/04/2006  . Irritable bowel syndrome 09/04/2006  .  DEGENERATIVE DISC DISEASE, CERVICAL SPINE 09/04/2006  . LOW BACK PAIN 09/04/2006  . HEADACHE 09/04/2006  . History of cardiovascular disorder 09/04/2006  . ONYCHOMYCOSIS, TOENAILS 09/01/2006  . DIARRHEA 02/18/2006    Past Surgical History:  Procedure Laterality Date  . ABDOMINAL HYSTERECTOMY    . APPENDECTOMY    . BREAST LUMPECTOMY Left 2015  . CARDIAC CATHETERIZATION  2005, 2006, 2009,2011  . CHOLECYSTECTOMY    . CORONARY STENT INTERVENTION N/A 04/22/2019   Procedure: CORONARY STENT INTERVENTION;  Surgeon: Troy Sine, MD;  Location: [facility] CV LAB;  Service: Cardiovascular;  Laterality: N/A;  . CORONARY STENT PLACEMENT     Status post balloon angiogram plasty and Cypher stent to the distal    circumflex and OM2 branch  . ESOPHAGOGASTRODUODENOSCOPY (EGD) WITH PROPOFOL Left 04/21/2019   Procedure: ESOPHAGOGASTRODUODENOSCOPY (EGD) WITH PROPOFOL;  Surgeon: Arta Silence, MD;  Location: [facility];  Service: Endoscopy;  Laterality: Left;  . Hammertoe repair    . Left Bunionectomy    . LEFT HEART CATH AND CORONARY ANGIOGRAPHY N/A 04/22/2019   Procedure: LEFT HEART CATH AND CORONARY ANGIOGRAPHY;  Surgeon: Troy Sine, MD;  Location: [facility] CV LAB;  Service: Cardiovascular;  Laterality: N/A;  . LEFT HEART CATHETERIZATION WITH CORONARY ANGIOGRAM N/A 12/03/2013   Procedure: LEFT HEART CATHETERIZATION WITH CORONARY ANGIOGRAM;  Surgeon: Leonie Man, MD;  Location[facility] C[facility] LAB;  Service: Cardiovascular;  Laterality: N/A;  . TUBAL LIGATION       OB History   No obstetric history on file.     Family History  Problem Relation Age of Onset  . Heart disease Mother   . Rheumatologic disease Mother   . Cancer - Other Mother        Uterine  . Emphysema Father   . Heart disease Father   . Cancer - Other Father   . Coronary artery disease Other   . Asthma Sister        2 sisters  . Stroke Sister     Social History   Tobacco Use  . Smoking status: Former Research scientist (life sciences)  .  Smokeless tobacco: Never Used  Substance Use Topics  . Alcohol use: No    Alcohol/week: 0.0 standard drinks  . Drug use: Yes    Comment: Seldom Marijuana use- 1Xmonth    Home Medications Prior to Admission medications   Medication Sig Start Date End Date Taking? Authorizing Provider  acetaminophen (TYLENOL) 325 MG tablet Take 2 tablets (650 mg total) by mouth every 4 (four) hours as needed for headache or mild pain. 04/23/19   Raiford Noble Latif, DO  acidophilus (RISAQUAD) CAPS capsule Take 1 capsule by mouth 2 (two) times daily. 04/09/19   [provider]  albuterol (PROVENTIL) (2.5 MG/3ML) 0.083% nebulizer solution USE 1 VIAL IN NEBULIZER EVERY 4 HOURS FOR WHEEZING OR SHORTNESS OF BREATH. 04/29/19   Tanda Rockers,  MD  albuterol (VENTOLIN HFA) 108 (90 Base) MCG/ACT inhaler Inhale 2 puffs into the lungs every 6 (six) hours as needed for wheezing or shortness of breath. 07/13/17   Tanda Rockers, MD  apixaban (ELIQUIS) 5 MG TABS tablet Take 1 tablet (5 mg total) by mouth 2 (two) times daily. 05/30/19   Josue Hector, MD  aspirin 81 MG chewable tablet Chew 1 tablet (81 mg total) by mouth daily. 04/24/19   Raiford Noble Latif, DO  atorvastatin (LIPITOR) 80 MG tablet Take 80 mg by mouth daily.    [provider]  blood glucose meter kit and supplies Dispense based on patient and insurance preference. Use up to four times daily as directed. (FOR ICD-9 250.00, 250.01). 01/13/15   Reyne Dumas, MD  budesonide-formoterol (SYMBICORT) 160-4.5 MCG/ACT inhaler Inhale 2 puffs into the lungs 2 (two) times daily. 10/26/16   Parrett, Fonnie Mu, NP  butalbital-acetaminophen-caffeine (FIORICET, ESGIC) 50-325-40 MG tablet Take 1-1.5 tablets by mouth every 8 (eight) hours as needed for headache or migraine. 1 - 1 and 1/2 every 8 hours as needed for headache     [provider]  calcium carbonate (OS-CAL) 600 MG TABS Take 600 mg by mouth every morning.     [provider]    Cholecalciferol (VITAMIN D) 2000 UNITS tablet Take 2,000 Units by mouth every morning.     [provider]  clopidogrel (PLAVIX) 75 MG tablet Take 1 tablet (75 mg total) by mouth daily with breakfast. 05/30/19   Burtis Junes, NP  Dextromethorphan-Guaifenesin (MUCINEX FAST-MAX DM MAX) 5-100 MG/5ML LIQD Take 5 mLs by mouth every 4 (four) hours as needed (cough/ congestiom). 1 tsp every 4 hours as needed for cough/congestion     [provider]  diltiazem (CARDIZEM CD) 120 MG 24 hr capsule Take 120 mg by mouth daily.  04/05/19   [provider]  donepezil (ARICEPT) 5 MG tablet Take 5 mg by mouth daily.  01/10/18   [provider]  doxycycline (VIBRAMYCIN) 100 MG capsule Take 1 capsule (100 mg total) by mouth 2 (two) times daily. 06/08/19   Orpah Greek, MD  DULoxetine (CYMBALTA) 60 MG capsule Take 60 mg by mouth every morning.     [provider]  Erenumab-aooe (AIMOVIG [place]) Inject 1 Dose into the skin as directed.     [provider]  gabapentin (NEURONTIN) 300 MG capsule Take 300 mg by mouth 3 (three) times daily.     [provider]  Lancets (FREESTYLE) lancets Use as instructed 01/13/15   Reyne Dumas, MD  letrozole [facility]) 2.5 MG tablet Take 2.5 mg by mouth every morning.     [provider]  metFORMIN (GLUCOPHAGE) 1000 MG tablet Take 1,000 mg by mouth 2 (two) times daily with a meal.    [provider]  methocarbamol (ROBAXIN) 750 MG tablet Take 750 mg by mouth every 8 (eight) hours as needed for muscle spasms.    [provider]  metoprolol succinate (TOPROL-XL) 25 MG 24 hr tablet Take 25 mg by mouth daily.  04/05/19   [provider]  Multiple Vitamin (MULTIVITAMIN) tablet Take 1 tablet by mouth daily.     [provider]  nitroGLYCERIN (NITROSTAT) 0.4 MG SL tablet Place 1 tablet (0.4 mg total) under the tongue every 5 (five) minutes as needed for chest pain (may repeat x3).  02/02/18   Josue Hector, MD  ondansetron (ZOFRAN) 8 MG tablet Take 8 mg by mouth  every 4 (four) hours as needed for nausea.     [provider]  OXYGEN Use 4L 24/7    [provider]  pantoprazole (PROTONIX) 40 MG tablet Take 40 mg by mouth daily. 05/05/19   [provider]  potassium chloride SA (KLOR-CON M20) 20 MEQ tablet Take 20 mEq by mouth daily. 04/05/19   [provider]  predniSONE (DELTASONE) 20 MG tablet Take 2 tablets (40 mg total) by mouth daily with breakfast. 06/08/19   Naarah Borgerding, Gwenyth Allegra, MD  Respiratory Therapy Supplies (FLUTTER) DEVI Use as directed 07/13/17   Tanda Rockers, MD  Tiotropium Bromide Monohydrate (SPIRIVA RESPIMAT) 2.5 MCG/ACT AERS Inhale 2 puffs into the lungs daily. 10/26/16   Parrett, Fonnie Mu, NP    Allergies    Patient has no known allergies.  Review of Systems   Review of Systems  Respiratory: Positive for shortness of breath.   Gastrointestinal: Positive for abdominal pain.  All other systems reviewed and are negative.   Physical Exam Updated Vital Signs BP 137/68   Pulse 95   Temp 98.2 F (36.8 C) (Oral)   Resp (!) 21   SpO2 100%   Physical Exam Vitals and nursing note reviewed.  Constitutional:      General: She is not in acute distress.    Appearance: Normal appearance. She is well-developed.  HENT:     Head: Normocephalic and atraumatic.     Right Ear: Hearing normal.     Left Ear: Hearing normal.     Nose: Nose normal.  Eyes:     Conjunctiva/sclera: Conjunctivae normal.     Pupils: Pupils are equal, round, and reactive to light.  Cardiovascular:     Rate and Rhythm: Regular rhythm.     Heart sounds: S1 normal and S2 normal. No murmur. No friction rub. No gallop.   Pulmonary:     Effort: Pulmonary effort is normal. No respiratory distress.     Breath sounds: Decreased air movement present. Decreased breath sounds and wheezing present.  Chest:     Chest wall: No tenderness.  Abdominal:      General: Bowel sounds are normal.     Palpations: Abdomen is soft.     Tenderness: There is no abdominal tenderness. There is no guarding or rebound. Negative signs include Murphy's sign and McBurney's sign.     Hernia: No hernia is present.  Musculoskeletal:        General: Normal range of motion.     Cervical back: Normal range of motion and neck supple.  Skin:    General: Skin is warm and dry.     Findings: No rash.  Neurological:     Mental Status: She is alert and oriented to person, place, and time.     GCS: GCS eye subscore is 4. GCS verbal subscore is 5. GCS motor subscore is 6.     Cranial Nerves: No cranial nerve deficit.     Sensory: No sensory deficit.     Coordination: Coordination normal.  Psychiatric:        Speech: Speech normal.        Behavior: Behavior normal.        Thought Content: Thought content normal.     ED Results / Procedures / Treatments   Labs (all labs ordered are listed, but only abnormal results are displayed) Labs Reviewed  BASIC METABOLIC PANEL - Abnormal; Notable for the following components:      Result Value   Chloride  96 (*)    Glucose, Bld 155 (*)    All other components within normal limits  CBC - Abnormal; Notable for the following components:   RBC 3.21 (*)    Hemoglobin 9.4 (*)    HCT 31.6 (*)    MCHC 29.7 (*)    RDW 16.2 (*)    All other components within normal limits  HEPATIC FUNCTION PANEL - Abnormal; Notable for the following components:   ALT 45 (*)    All other components within normal limits  BRAIN NATRIURETIC PEPTIDE - Abnormal; Notable for the following components:   B Natriuretic Peptide 256.0 (*)    All other components within normal limits  URINALYSIS, ROUTINE W REFLEX MICROSCOPIC - Abnormal; Notable for the following components:   Protein, ur 30 (*)    All other components within normal limits  LIPASE, BLOOD  TROPONIN I (HIGH SENSITIVITY)  TROPONIN I (HIGH SENSITIVITY)    EKG EKG  Interpretation  Date/Time:  Saturday June 08 2019 02:27:41 EDT Ventricular Rate:  95 PR Interval:  110 QRS Duration: 94 QT Interval:  360 QTC Calculation: 452 R Axis:   100 Text Interpretation: Sinus rhythm with short PR Right atrial enlargement Rightward axis Pulmonary disease pattern Incomplete right bundle branch block Abnormal ECG No significant change since last tracing Confirmed by Orpah Greek [phone]) on 06/08/2019 2:41:49 AM   Radiology CT ANGIO CHEST PE W OR WO CONTRAST  Result Date: 06/08/2019 CLINICAL DATA:  Shortness of breath.  Abdominal pain. EXAM: CT ANGIOGRAPHY CHEST WITH CONTRAST TECHNIQUE: Multidetector CT imaging of the chest was performed using the standard protocol during bolus administration of intravenous contrast. Multiplanar CT image reconstructions and MIPs were obtained to evaluate the vascular anatomy. CONTRAST:  136m OMNIPAQUE IOHEXOL 350 MG/ML SOLN COMPARISON:  CT of the chest March 28, 2019. Chest x-ray June 08, 1999. FINDINGS: Cardiovascular: The heart size is unchanged. A coronary stent is identified. Coronary artery calcifications are noted. Atherosclerotic changes are seen in the nonaneurysmal aorta. No dissection. Central pulmonary arteries are normal. No pulmonary emboli identified. Mediastinum/Nodes: There is a small left pleural effusion, smaller in the interval. The right pleural effusion has resolved. A pericardial effusion remains measuring up to 12 mm posteriorly on the left and 14 mm posteriorly on the right, a little larger in the interval. The esophagus and thyroid are unremarkable. The chest wall is stable. There is skin thickening associated with the left breast, unchanged. I suspect this is due to the previous reported left lumpectomy and probable radiation. Lungs/Pleura: Advanced emphysematous changes are identified in the lungs bilaterally. No pulmonary nodules or masses. No focal infiltrates are identified. Upper Abdomen: No acute  abnormality. Musculoskeletal: No chest wall abnormality. No acute or significant osseous findings. Review of the MIP images confirms the above findings. IMPRESSION: 1. No pulmonary emboli identified. 2. Coronary artery calcifications. Atherosclerotic changes in the thoracic aorta. 3. The left pleural effusion is little larger in the interval. The right pleural effusion has resolved. A small left effusion is smaller in the interval. 4. Skin thickening in the left breast is probably due to previous radiation given history of lumpectomy. This finding is stable. Recommend clinical correlation. 5. Advanced emphysematous changes in the lungs. Aortic Atherosclerosis (ICD10-I70.0) and Emphysema (ICD10-J43.9). Electronically Signed   By: DDorise BullionIII M.D   On: 06/08/2019 06:06   CT ABDOMEN PELVIS W CONTRAST  Result Date: 06/08/2019 CLINICAL DATA:  Abdominal pain. EXAM: CT ABDOMEN AND PELVIS WITH CONTRAST TECHNIQUE: Multidetector CT  imaging of the abdomen and pelvis was performed using the standard protocol following bolus administration of intravenous contrast. CONTRAST:  15m OMNIPAQUE IOHEXOL 350 MG/ML SOLN COMPARISON:  January 13, 2012 FINDINGS: Lower chest: Emphysematous changes in the lung bases. There is a small left effusion. Pericardial effusion is identified as well. Skin thickening in the left breast. These findings were more fully described on the CT of the chest from today. Hepatobiliary: Previous cholecystectomy. No liver masses are identified. Central portal veins are patent. Pancreas: Unremarkable. No pancreatic ductal dilatation or surrounding inflammatory changes. Spleen: Normal in size without focal abnormality. Adrenals/Urinary Tract: Adrenal glands are normal. No suspicious renal masses, hydronephrosis, or perinephric stranding. No ureterectasis or ureteral stones. The bladder is normal. Stomach/Bowel: The stomach and small bowel are normal. Scattered colonic diverticulosis without  diverticulitis. Mild fecal loading in the cecum. The patient is status post appendectomy. Vascular/Lymphatic: Severe atherosclerosis is seen in the nonaneurysmal aorta, iliac vessels, and femoral vessels. No adenopathy. Reproductive: Status post hysterectomy. No adnexal masses. Other: No free air.  No free fluid. Musculoskeletal: No acute or significant osseous findings. IMPRESSION: 1. The skin thickening in the left breast, left-sided pleural effusion, emphysema, and pericardial effusion or more fully described in the CT angiogram of the chest from today. 2. Previous cholecystectomy. 3. Diverticulosis without diverticulitis.  Previous appendectomy. 4. Severe atherosclerosis in the nonaneurysmal aorta. 5. No other acute abnormalities. Electronically Signed   By: DDorise BullionIII M.D   On: 06/08/2019 06:17   DG Chest Port 1 View  Result Date: 06/08/2019 CLINICAL DATA:  Shortness of breath EXAM: PORTABLE CHEST 1 VIEW COMPARISON:  Most recently 04/26/2019 FINDINGS: The lungs are hyperinflated with chronically coarsened interstitial changes which a appear somewhat increasingly prominent on today's examination with increased lower lobe airways thickening. No pneumothorax. No effusion. No consolidative process is seen. Coronary stents project over the cardiac silhouette. The aorta is calcified. The remaining cardiomediastinal contours are unremarkable. No acute osseous or soft tissue abnormality. Degenerative changes are present in the imaged spine and shoulders. IMPRESSION: 1. Features of chronic hyperinflation, COPD. Increasing interstitial opacities with bronchitic change may reflect an acute exacerbation. 2. Prior coronary stenting. 3.  Aortic Atherosclerosis (ICD10-I70.0). Electronically Signed   By: PLovena LeM.D.   On: 06/08/2019 03:35    Procedures Procedures (including critical care time)  Medications Ordered in ED Medications  iohexol (OMNIPAQUE) 350 MG/ML injection 100 mL (100 mLs Intravenous  Contrast Given 06/08/19 0530)    ED Course  I have reviewed the triage vital signs and the nursing notes.  Pertinent labs & imaging results that were available during my care of the patient were reviewed by me and considered in my medical decision making (see chart for details).    MDM Rules/Calculators/A&P                      Patient presents with multiple complaints.  Patient complaining of diffuse lower abdominal pain that has been an ongoing issue for her.  Lab work unremarkable.  Chest x-ray does not show any evidence of pneumonia or other acute pathology.  EKG, troponin x2 unremarkable.  Patient underwent CT angiography of chest to further evaluate.  No acute pathology noted other than her chronic pleural effusions.  No associated infection.  Patient resting comfortably upon reevaluation.  She is on her normal 4 L and O2 sats are 100%.  Will treat for COPD exacerbation.  Patient's abdominal pain is of unclear etiology.  She has  a mostly benign abdominal exam.  She has some mild tenderness but no guarding or rebound.  CT scan does not show anything acute.  Final Clinical Impression(s) / ED Diagnoses Final diagnoses:  Generalized abdominal pain  Chronic obstructive pulmonary disease, unspecified COPD type ([place])    Rx / DC Orders ED Discharge Orders         Ordered    doxycycline (VIBRAMYCIN) 100 MG capsule  2 times daily     06/08/19 0643    predniSONE (DELTASONE) 20 MG tablet  Daily with breakfast     06/08/19 0643           Orpah Greek, MD 06/08/19 [phone]

## 2019-06-11 ENCOUNTER — Telehealth: Payer: Self-pay | Admitting: *Deleted

## 2019-06-11 ENCOUNTER — Ambulatory Visit: Payer: Medicare Other | Admitting: Cardiovascular Disease

## 2019-06-11 NOTE — Telephone Encounter (Signed)
   [facility] Pre-operative Risk Assessment    Request for surgical clearance:  1. What type of surgery is being performed? COLONOSCOPY   2. When is this surgery scheduled? TBD   3. What type of clearance is required (medical clearance vs. Pharmacy clearance to hold med vs. Both)? MEDICAL  4. Are there any medications that need to be held prior to surgery and how long? PLAVIX X 4 DAYS PRIOR TO PROCEDURE   5. Practice name and name of physician performing surgery? [facility]; DR. Reesa Chew   6. What is your office phone number [phone]    7.   What is your office fax number [phone]  8.   Anesthesia type (None, local, MAC, general) ? NOT LISTED ; PROPOFOL?    Julaine Hua 06/11/2019, 12:01 PM  _________________________________________________________________   (provider comments below)

## 2019-06-11 NOTE — Telephone Encounter (Signed)
I s/w Dr. Darlina Sicilian office and left message for [facility] surgery scheduler to call back as pre op provider is asking how urgent procedure is as pt has recently had cardiac stent placed 04/2019. See note from [facility], [facility].

## 2019-06-11 NOTE — Telephone Encounter (Signed)
I tried to [facility] surgery scheduler; while I was on hold waiting for Velna Hatchet to come to the phone, the phone made a crackling noise and then it disconnected my call. I am going to fax notes to surgeon to please give input as to urgency for the procedure, please read Marjie Skiff, [facility] notes.

## 2019-06-11 NOTE — Telephone Encounter (Signed)
Can we please reach out to Dr. Darlina Sicilian office and see how urgent this procedure is? She just had a cardiac stent placed in 04/2019; therefore, would be high risk for in-stent restenosis if Plavix was held. We typically, recommend continuing uninterrupted antiplatelet therapy for at least 6 months following PCI (ideally 12 months). However, I also see that she has had a lot of problems with anemia so just would like to know the urgency of this procedure.   Thank you!

## 2019-06-12 NOTE — Telephone Encounter (Signed)
I spoke to Velna Hatchet at Dr. Darlina Sicilian office. She says that Dr. Elpidio Anis notes that the patient had severe anemia with Hgb down to 7.3 in February. Now her Hgb has been stable, 9.4 on 06/08/19. Dr. Elpidio Anis thinks that a colonoscopy should be done within the next 1-2 months. I asked if they could do a diagnostic while on Plavix and then could address holding plavix if intervention needed. She said that this option could be presented to Dr. Elpidio Anis if cardiologist recommended it.   I will route to Dr. Eden Emms for his recommendations.

## 2019-06-12 NOTE — Telephone Encounter (Signed)
VM left for pt to call back and ask for preop clinic to discuss the recommendations of Dr. Eden Emms and update her current status, symptoms.

## 2019-06-12 NOTE — Telephone Encounter (Signed)
   Primary Cardiologist:Peter Eden Emms, MD  Chart reviewed as part of pre-operative protocol coverage. Patricia Wall has a history of CAD and multiple stents. She had recent placement of DES to LAD 05/08/19 for which she is on Plavix. She is also on Eliquis for atrial fibrillation.   She had severe anemia in February. No source of anemia found on EGD. She is now having iron infusions via hematology. Hgb is stable at 9.4 on 3/20.   In light of her recent stent, per Dr. Eden Emms, she needs at least 3 months of uninterrupted antiplatelet therapy with Plavix. The earliest that Plavix could be held is 08/05/19. It is OK to do a diagnostic colonoscopy with no intervention and no interruption in Plavix if that would be an option.   I called and spoke to the patient's daughter about the above. The patient is not having any obvious blood in stool. No chest discomfort. She is having her baseline DOE related to COPD. Her daughter is concerned about the possibility of bleeding.   If she has recurrent anemia suspected to be related to blood loss, would consider stopping Eliquis.   Will route these recommendations to requesting provider via Epic fax function. Please call with questions.  Berton Bon, NP 06/12/2019, 2:24 PM

## 2019-06-12 NOTE — Telephone Encounter (Signed)
Needs at least 3 months uninterupted DAT. Earliest I would hold plavix is May Ok to do diagnostic colon with no intervention if possible

## 2019-06-13 NOTE — Telephone Encounter (Signed)
Pt has appt with Dr. Eden Emms 07/01/19. See notes from Lizabeth Leyden, NP to Dr. Eden Emms. I will forward clearance notes to MD for upcoming appt. I will send FYI to Dr. Elpidio Anis who will be performing procedure. I will remove from the pre op call back pool.

## 2019-06-16 ENCOUNTER — Encounter (HOSPITAL_COMMUNITY): Payer: Self-pay

## 2019-06-16 ENCOUNTER — Emergency Department (HOSPITAL_COMMUNITY): Payer: Medicare Other

## 2019-06-16 ENCOUNTER — Other Ambulatory Visit: Payer: Self-pay

## 2019-06-16 ENCOUNTER — Emergency Department (HOSPITAL_COMMUNITY)
Admission: EM | Admit: 2019-06-16 | Discharge: 2019-06-16 | Disposition: A | Discharge status: Home / Self Care | Payer: Medicare Other | Attending: Emergency Medicine | Admitting: Emergency Medicine

## 2019-06-16 DIAGNOSIS — I110 Hypertensive heart disease with heart failure: Secondary | ICD-10-CM | POA: Diagnosis not present

## 2019-06-16 DIAGNOSIS — Z87891 Personal history of nicotine dependence: Secondary | ICD-10-CM | POA: Insufficient documentation

## 2019-06-16 DIAGNOSIS — I2510 Atherosclerotic heart disease of native coronary artery without angina pectoris: Secondary | ICD-10-CM | POA: Diagnosis not present

## 2019-06-16 DIAGNOSIS — I5032 Chronic diastolic (congestive) heart failure: Secondary | ICD-10-CM | POA: Diagnosis not present

## 2019-06-16 DIAGNOSIS — Z7982 Long term (current) use of aspirin: Secondary | ICD-10-CM | POA: Diagnosis not present

## 2019-06-16 DIAGNOSIS — Z79899 Other long term (current) drug therapy: Secondary | ICD-10-CM | POA: Insufficient documentation

## 2019-06-16 DIAGNOSIS — E119 Type 2 diabetes mellitus without complications: Secondary | ICD-10-CM | POA: Insufficient documentation

## 2019-06-16 DIAGNOSIS — R0602 Shortness of breath: Secondary | ICD-10-CM | POA: Diagnosis present

## 2019-06-16 DIAGNOSIS — Z7984 Long term (current) use of oral hypoglycemic drugs: Secondary | ICD-10-CM | POA: Insufficient documentation

## 2019-06-16 DIAGNOSIS — J441 Chronic obstructive pulmonary disease with (acute) exacerbation: Secondary | ICD-10-CM | POA: Diagnosis not present

## 2019-06-16 DIAGNOSIS — Z7901 Long term (current) use of anticoagulants: Secondary | ICD-10-CM | POA: Insufficient documentation

## 2019-06-16 LAB — CBC WITH DIFFERENTIAL/PLATELET
Abs Immature Granulocytes: 0.07 10*3/uL (ref 0.00–0.07)
Basophils Absolute: 0 10*3/uL (ref 0.0–0.1)
Basophils Relative: 0 %
Eosinophils Absolute: 0.1 10*3/uL (ref 0.0–0.5)
Eosinophils Relative: 1 %
HCT: 30.5 % — ABNORMAL LOW (ref 36.0–46.0)
Hemoglobin: 9.1 g/dL — ABNORMAL LOW (ref 12.0–15.0)
Immature Granulocytes: 1 %
Lymphocytes Relative: 11 %
Lymphs Abs: 1.4 10*3/uL (ref 0.7–4.0)
MCH: 29 pg (ref 26.0–34.0)
MCHC: 29.8 g/dL — ABNORMAL LOW (ref 30.0–36.0)
MCV: 97.1 fL (ref 80.0–100.0)
Monocytes Absolute: 1.1 10*3/uL — ABNORMAL HIGH (ref 0.1–1.0)
Monocytes Relative: 8 %
Neutro Abs: 10.4 10*3/uL — ABNORMAL HIGH (ref 1.7–7.7)
Neutrophils Relative %: 79 %
Platelets: 269 10*3/uL (ref 150–400)
RBC: 3.14 MIL/uL — ABNORMAL LOW (ref 3.87–5.11)
RDW: 15.9 % — ABNORMAL HIGH (ref 11.5–15.5)
WBC: 13 10*3/uL — ABNORMAL HIGH (ref 4.0–10.5)
nRBC: 0 % (ref 0.0–0.2)

## 2019-06-16 LAB — BASIC METABOLIC PANEL
Anion gap: 8 (ref 5–15)
BUN: 17 mg/dL (ref 8–23)
CO2: 36 mmol/L — ABNORMAL HIGH (ref 22–32)
Calcium: 9.1 mg/dL (ref 8.9–10.3)
Chloride: 93 mmol/L — ABNORMAL LOW (ref 98–111)
Creatinine, Ser: 0.73 mg/dL (ref 0.44–1.00)
GFR calc Af Amer: >60 mL/min (ref >60)
GFR calc non Af Amer: >60 mL/min (ref >60)
Glucose, Bld: 296 mg/dL — ABNORMAL HIGH (ref 70–99)
Potassium: 4.7 mmol/L (ref 3.5–5.1)
Sodium: 137 mmol/L (ref 135–145)

## 2019-06-16 LAB — TROPONIN I (HIGH SENSITIVITY)
Troponin I (High Sensitivity): 9 ng/L (ref <18)
Troponin I (High Sensitivity): 9 ng/L (ref <18)

## 2019-06-16 LAB — BRAIN NATRIURETIC PEPTIDE: B Natriuretic Peptide: 101.4 pg/mL — ABNORMAL HIGH (ref 0.0–100.0)

## 2019-06-16 MED ORDER — MAGNESIUM SULFATE 2 GM/50ML IV SOLN
2.0000 g | Freq: Once | INTRAVENOUS | Status: AC
  Administered 2019-06-16: 2 g via INTRAVENOUS
  Filled 2019-06-16: qty 50

## 2019-06-16 MED ORDER — IPRATROPIUM-ALBUTEROL 0.5-2.5 (3) MG/3ML IN SOLN
3.0000 mL | Freq: Once | RESPIRATORY_TRACT | Status: AC
  Administered 2019-06-16: 14:00:00 3 mL via RESPIRATORY_TRACT
  Filled 2019-06-16: qty 3

## 2019-06-16 MED ORDER — IPRATROPIUM-ALBUTEROL 0.5-2.5 (3) MG/3ML IN SOLN
3.0000 mL | Freq: Once | RESPIRATORY_TRACT | Status: AC
  Administered 2019-06-16: 3 mL via RESPIRATORY_TRACT
  Filled 2019-06-16: qty 3

## 2019-06-16 MED ORDER — PREDNISONE 10 MG PO TABS: ORAL_TABLET | ORAL | 0 refills | Status: AC

## 2019-06-16 NOTE — Discharge Instructions (Addendum)
Continue you take your medications as prescribed.  Use your albuterol nebulizer every four hours for the next two days.  Get rechecked immediately if you develop new or worsening symptoms.

## 2019-06-16 NOTE — ED Notes (Signed)
Patient verbalizes understanding of discharge instructions. Opportunity for questioning and answers were provided. Pt discharged from ED. 

## 2019-06-16 NOTE — ED Triage Notes (Signed)
Pt from home via ems; c/o increaaed sob x 2 days seen for same 1 weeks ago, given prednisone; just finished treatment for stomach infection; endorses some chest tightness, more epigastric; 12 lead unremarkable with ems; wheezing in all fields; 125 solumedrol given pts; 5 mg albuterol (used home inhaler); per daughter, used 3 neb tx yesterday w/o relief; a and o x 4; pt on plavix and eliquis, held ASA; hx stent placement, copd, afib  136/70 HR 110 sinus tach RR 24 98% 4L (wears at home) T 97.9 f

## 2019-06-16 NOTE — ED Provider Notes (Signed)
[facility] EMERGENCY DEPARTMENT Provider Note   CSN: [id number] Arrival date & time: 06/16/19  1257     History Chief Complaint  Patient presents with  . Shortness of Breath    Patricia Wall is a 69 y.o. female.  The history is provided by the patient and medical records. No language interpreter was used.   Patricia Wall is a 69 y.o. female who presents to the Emergency Department complaining of sob.  She presents to the ED by EMS complaining of sob for the last few days.  She has a hx/o COPD and is on 4L [place] at baseline.  She has SOB at rest and is in a wheelchair at baseline.  She has associated central chest tightness.  No reports of fevers, diaphoresis, nausea.  She was seen in the ED about one week ago for lower abdominal pain - that is currently resolved.  At that time she was treated with doxycycline and prednisone.  She completed the course of medications and then her SOB returned/worsened.      Lives at home with her daughter.      Past Medical History:  Diagnosis Date  . Abnormal chest xray   . Alcohol abuse   . Back pain   . CAD (coronary artery disease)     Prev seen by [facility].  Stent to OM and LAD Last cath 6/11 cathed on June 13, [name]. 2011.  The cardiac catheterization showed 30% and 50% lesions in the mid  and distal LAD, 50% in the OM1, 40% in the OM2 with 20% in-stent  restenosis, 40% in-stent restenosis in the circumflex and no significant  disease in the RCA.  Her EF was normal.  Dr. Olevia Perches recommended a trial  of proton pump inhibitors   . Chest pain   . COPD (chronic obstructive pulmonary disease) ([place])   . Cyanosis   . DDD (degenerative disc disease)   . Depression   . Diarrhea   . Diverticular disease   . GERD (gastroesophageal reflux disease)   . H/O: hysterectomy   . Hemorrhoids   . HTN (hypertension)   . IBS (irritable bowel syndrome)   . Insomnia   . Lymphadenitis   . Pharyngitis   . PUD (peptic ulcer disease)   . PVD  (peripheral vascular disease) ([place])   . Sinusitis   . TIA (transient ischemic attack)   . Tobacco abuse   . Vertigo     Patient Active Problem List   Diagnosis Date Noted  . ARF (acute renal failure) ([place]) 04/27/2019  . Hypotension 04/27/2019  . Diabetes mellitus type 2 in nonobese ([employer]) 04/27/2019  . Pressure injury of skin 04/23/2019  . Acute on chronic respiratory failure with hypercapnia ([place])   . SOB (shortness of breath)   . Acute diastolic CHF (congestive heart failure) ([place])   . Exertional angina ([facility])   . Atrial fibrillation with RVR ([place])   . Anemia 04/18/2019  . Thrombocytopenia ([place]) 04/18/2019  . AF (paroxysmal atrial fibrillation) ([place]) 04/18/2019  . Chronic respiratory failure with hypoxia and hypercapnia ([place]) 03/29/2018  . Acute respiratory failure with hypoxia ([place]) 01/07/2015  . CAD (coronary artery disease) 01/07/2015  . Obesity 11/01/2014  . COPD exacerbation ([place]) 12/30/2013  . Chest pain 12/03/2013  . Atherosclerotic heart disease of native coronary artery with angina pectoris ([place]) 12/03/2013    Class: Diagnosis of  . Syncope 12/02/2013  . Dyspnea 10/02/2013  . Acute upper respiratory infections of unspecified site  04/03/2013  . Right carotid bruit 11/15/2012  . Peripheral vascular disease ([place]) 09/30/2009  . Nonspecific (abnormal) findings on radiological and other examination of body structure 09/02/2009  . ABNORMAL CHEST XRAY 09/02/2009  . TINNITUS 04/15/2008  . COUGH 04/15/2008  . VERTIGO 02/04/2008  . PHARYNGITIS 07/18/2007  . LYMPHADENITIS, CERVICAL 07/18/2007  . Angina, class III ([place]) 03/29/2007  . CONDYLOMA ACUMINATUM 03/01/2007  . SINUSITIS, ACUTE 03/01/2007  . INSOMNIA 01/30/2007  . Elevated lipids 09/04/2006  . ABUSE, ALCOHOL, UNSPECIFIED 09/04/2006  . Cigarette smoker 09/04/2006  . DEPRESSION 09/04/2006  . Essential hypertension 09/04/2006  . CAD S/P percutaneous coronary angioplasty: Prior Cypher DES to pLAD, pOM2 & AVG Cx (after  Om2); New DES to AVG Cx ISR & Angiosculpt PTCA of Ostial AVG Cx from OM2. 09/04/2006  . HEMORRHOIDS 09/04/2006  . COPD GOLD II / still smoking  09/04/2006  . GERD 09/04/2006  . PEPTIC ULCER DISEASE 09/04/2006  . DIVERTICULOSIS, COLON 09/04/2006  . Irritable bowel syndrome 09/04/2006  . DEGENERATIVE DISC DISEASE, CERVICAL SPINE 09/04/2006  . LOW BACK PAIN 09/04/2006  . HEADACHE 09/04/2006  . History of cardiovascular disorder 09/04/2006  . ONYCHOMYCOSIS, TOENAILS 09/01/2006  . DIARRHEA 02/18/2006    Past Surgical History:  Procedure Laterality Date  . ABDOMINAL HYSTERECTOMY    . APPENDECTOMY    . BREAST LUMPECTOMY Left 2015  . CARDIAC CATHETERIZATION  2005, 2006, 2009,2011  . CHOLECYSTECTOMY    . CORONARY STENT INTERVENTION N/A 04/22/2019   Procedure: CORONARY STENT INTERVENTION;  Surgeon: Troy Sine, MD;  Location: [facility] CV LAB;  Service: Cardiovascular;  Laterality: N/A;  . CORONARY STENT PLACEMENT     Status post balloon angiogram plasty and Cypher stent to the distal    circumflex and OM2 branch  . ESOPHAGOGASTRODUODENOSCOPY (EGD) WITH PROPOFOL Left 04/21/2019   Procedure: ESOPHAGOGASTRODUODENOSCOPY (EGD) WITH PROPOFOL;  Surgeon: Arta Silence, MD;  Location: [facility];  Service: Endoscopy;  Laterality: Left;  . Hammertoe repair    . Left Bunionectomy    . LEFT HEART CATH AND CORONARY ANGIOGRAPHY N/A 04/22/2019   Procedure: LEFT HEART CATH AND CORONARY ANGIOGRAPHY;  Surgeon: Troy Sine, MD;  Location: [facility] CV LAB;  Service: Cardiovascular;  Laterality: N/A;  . LEFT HEART CATHETERIZATION WITH CORONARY ANGIOGRAM N/A 12/03/2013   Procedure: LEFT HEART CATHETERIZATION WITH CORONARY ANGIOGRAM;  Surgeon: Leonie Man, MD;  Location[facility] CATH LAB;  Service: Cardiovascular;  Laterality: N/A;  . TUBAL LIGATION       OB History   No obstetric history on file.     Family History  Problem Relation Age of Onset  . Heart disease Mother   . Rheumatologic  disease Mother   . Cancer - Other Mother        Uterine  . Emphysema Father   . Heart disease Father   . Cancer - Other Father   . Coronary artery disease Other   . Asthma Sister        2 sisters  . Stroke Sister     Social History   Tobacco Use  . Smoking status: Former Research scientist (life sciences)  . Smokeless tobacco: Never Used  Substance Use Topics  . Alcohol use: No    Alcohol/week: 0.0 standard drinks  . Drug use: Yes    Comment: Seldom Marijuana use- 1Xmonth    Home Medications Prior to Admission medications   Medication Sig Start Date End Date Taking? Authorizing Provider  acetaminophen (TYLENOL) 325 MG tablet Take 2 tablets (650 mg  total) by mouth every 4 (four) hours as needed for headache or mild pain. 04/23/19   Raiford Noble Latif, DO  acidophilus (RISAQUAD) CAPS capsule Take 1 capsule by mouth 2 (two) times daily. 04/09/19   [provider]  albuterol (PROVENTIL) (2.5 MG/3ML) 0.083% nebulizer solution USE 1 VIAL IN NEBULIZER EVERY 4 HOURS FOR WHEEZING OR SHORTNESS OF BREATH. 04/29/19   Tanda Rockers, MD  albuterol (VENTOLIN HFA) 108 (90 Base) MCG/ACT inhaler Inhale 2 puffs into the lungs every 6 (six) hours as needed for wheezing or shortness of breath. 07/13/17   Tanda Rockers, MD  apixaban (ELIQUIS) 5 MG TABS tablet Take 1 tablet (5 mg total) by mouth 2 (two) times daily. 05/30/19   Josue Hector, MD  aspirin 81 MG chewable tablet Chew 1 tablet (81 mg total) by mouth daily. 04/24/19   Raiford Noble Latif, DO  atorvastatin (LIPITOR) 80 MG tablet Take 80 mg by mouth daily.    [provider]  blood glucose meter kit and supplies Dispense based on patient and insurance preference. Use up to four times daily as directed. (FOR ICD-9 250.00, 250.01). 01/13/15   Reyne Dumas, MD  budesonide-formoterol (SYMBICORT) 160-4.5 MCG/ACT inhaler Inhale 2 puffs into the lungs 2 (two) times daily. 10/26/16   Parrett, Fonnie Mu, NP  butalbital-acetaminophen-caffeine (FIORICET, ESGIC)  50-325-40 MG tablet Take 1-1.5 tablets by mouth every 8 (eight) hours as needed for headache or migraine. 1 - 1 and 1/2 every 8 hours as needed for headache     [provider]  calcium carbonate (OS-CAL) 600 MG TABS Take 600 mg by mouth every morning.     [provider]  Cholecalciferol (VITAMIN D) 2000 UNITS tablet Take 2,000 Units by mouth every morning.     [provider]  clopidogrel (PLAVIX) 75 MG tablet Take 1 tablet (75 mg total) by mouth daily with breakfast. 05/30/19   Burtis Junes, NP  Dextromethorphan-Guaifenesin (MUCINEX FAST-MAX DM MAX) 5-100 MG/5ML LIQD Take 5 mLs by mouth every 4 (four) hours as needed (cough/ congestiom). 1 tsp every 4 hours as needed for cough/congestion     [provider]  diltiazem (CARDIZEM CD) 120 MG 24 hr capsule Take 120 mg by mouth daily.  04/05/19   [provider]  donepezil (ARICEPT) 5 MG tablet Take 5 mg by mouth daily.  01/10/18   [provider]  doxycycline (VIBRAMYCIN) 100 MG capsule Take 1 capsule (100 mg total) by mouth 2 (two) times daily. 06/08/19   Orpah Greek, MD  DULoxetine (CYMBALTA) 60 MG capsule Take 60 mg by mouth every morning.     [provider]  Erenumab-aooe (AIMOVIG [place]) Inject 1 Dose into the skin as directed.     [provider]  gabapentin (NEURONTIN) 300 MG capsule Take 300 mg by mouth 3 (three) times daily.     [provider]  Lancets (FREESTYLE) lancets Use as instructed 01/13/15   Reyne Dumas, MD  letrozole [facility]) 2.5 MG tablet Take 2.5 mg by mouth every morning.     [provider]  metFORMIN (GLUCOPHAGE) 1000 MG tablet Take 1,000 mg by mouth 2 (two) times daily with a meal.    [provider]  methocarbamol (ROBAXIN) 750 MG tablet Take 750 mg by mouth every 8 (eight) hours as needed for muscle spasms.    [provider]  metoprolol succinate (TOPROL-XL) 25 MG 24 hr tablet Take 25 mg by mouth daily.   04/05/19  [provider]  Multiple Vitamin (MULTIVITAMIN) tablet Take 1 tablet by mouth daily.     [provider]  nitroGLYCERIN (NITROSTAT) 0.4 MG SL tablet Place 1 tablet (0.4 mg total) under the tongue every 5 (five) minutes as needed for chest pain (may repeat x3). 02/02/18   Josue Hector, MD  ondansetron (ZOFRAN) 8 MG tablet Take 8 mg by mouth every 4 (four) hours as needed for nausea.     [provider]  OXYGEN Use 4L 24/7    [provider]  pantoprazole (PROTONIX) 40 MG tablet Take 40 mg by mouth daily. 05/05/19   [provider]  potassium chloride SA (KLOR-CON M20) 20 MEQ tablet Take 20 mEq by mouth daily. 04/05/19   [provider]  predniSONE (DELTASONE) 10 MG tablet Take 5 tablets (50 mg total) by mouth daily for 3 days, THEN 4 tablets (40 mg total) daily for 3 days, THEN 3 tablets (30 mg total) daily for 3 days, THEN 2 tablets (20 mg total) daily for 3 days, THEN 1 tablet (10 mg total) daily for 3 days. 06/16/19 07/01/19  Quintella Reichert, MD  Respiratory Therapy Supplies (FLUTTER) DEVI Use as directed 07/13/17   Tanda Rockers, MD  Tiotropium Bromide Monohydrate (SPIRIVA RESPIMAT) 2.5 MCG/ACT AERS Inhale 2 puffs into the lungs daily. 10/26/16   Parrett, Fonnie Mu, NP    Allergies    Patient has no known allergies.  Review of Systems   Review of Systems  All other systems reviewed and are negative.   Physical Exam Updated Vital Signs BP (!) 160/84   Pulse (!) 106   Temp 98.4 F (36.9 C) (Oral)   Resp (!) 24   Ht _0  (1.676 m)   Wt 68.9 kg   SpO2 99%   BMI 24.53 kg/m   Physical Exam Vitals and nursing note reviewed.  Constitutional:      Appearance: She is well-developed.  HENT:     Head: Normocephalic and atraumatic.  Cardiovascular:     Rate and Rhythm: Regular rhythm. Tachycardia present.     Heart sounds: No murmur.  Pulmonary:     Effort: Pulmonary effort is normal. No respiratory distress.     Breath  sounds: Normal breath sounds.     Comments: Diffuse end expiratory wheezes Abdominal:     Palpations: Abdomen is soft.     Tenderness: There is no abdominal tenderness. There is no guarding or rebound.  Musculoskeletal:        General: Swelling present. No tenderness.     Comments: 1-2+ pitting edema to BLE  Skin:    General: Skin is warm and dry.  Neurological:     Mental Status: She is alert and oriented to person, place, and time.  Psychiatric:        Behavior: Behavior normal.     ED Results / Procedures / Treatments   Labs (all labs ordered are listed, but only abnormal results are displayed) Labs Reviewed  BASIC METABOLIC PANEL - Abnormal; Notable for the following components:      Result Value   Chloride 93 (*)    CO2 36 (*)    Glucose, Bld 296 (*)    All other components within normal limits  BRAIN NATRIURETIC PEPTIDE - Abnormal; Notable for the following components:   B Natriuretic Peptide 101.4 (*)    All other components within normal limits  CBC WITH DIFFERENTIAL/PLATELET - Abnormal; Notable for the following components:   WBC 13.0 (*)  RBC 3.14 (*)    Hemoglobin 9.1 (*)    HCT 30.5 (*)    MCHC 29.8 (*)    RDW 15.9 (*)    Neutro Abs 10.4 (*)    Monocytes Absolute 1.1 (*)    All other components within normal limits  TROPONIN I (HIGH SENSITIVITY)  TROPONIN I (HIGH SENSITIVITY)    EKG EKG Interpretation  Date/Time:  _0 /28/21 1640           Quintella Reichert, MD 06/16/19 1640

## 2019-06-16 NOTE — ED Notes (Signed)
Spoke with pt's daughter and PCG, Leeroy Bock; reveiwed discharge instructions; [name] to pick up pt in approx 30 minutes

## 2019-06-24 NOTE — Progress Notes (Signed)
Virtual Visit via Video Note   This visit type was conducted due to national recommendations for restrictions regarding the COVID-19 Pandemic (e.g. social distancing) in an effort to limit this patient's exposure and mitigate transmission in our community.  Due to her co-morbid illnesses, this patient is at least at moderate risk for complications without adequate follow up.  This format is felt to be most appropriate for this patient at this time.  All issues noted in this document were discussed and addressed.  A limited physical exam was performed with this format.  Please refer to the patient's chart for her consent to telehealth for [facility].   Patient Location: Home Physician Location: Office   Date:  07/01/2019    Patricia Wall Date of Birth: 05-Feb-1951 Medical Record #[id number]  PCP:  Bernerd Limbo, MD  Cardiologist:  Gillian Shields  No chief complaint on file.   History of Present Illness:  69 y.o. seen for f/u CAD and PAF. She is in wheelchair with oxygen dependent COPD but is still smoking    She had stenting to LAD, distal LCx and OM2 in 2005, in-stent restenosis with balloon in 2006, cath with patent stents in 2011. Had recurrent chest pain and underwent re-stenting to LCx in 2015.   She has known RICA stenosis 60-79% last duplex 02/08/18    She was admitted to [facility] January 2021 .She had had PAF and diastolic HF - in the setting of acute hypoxemic respiratory failure - COVID negative.  Decision to start anticoagulation deferred to [place]. She had profound anemia (HGB 6.2)- required transfusion. Balance very poor. Falling. Aspirin and Plavix were restarted at discharge. Social situation quite challenging - she lives with her husband who has had a stroke - daughter here in [place] taking care of her disabled husband. Another daughter with leukemia. She did express desire to remain a full code and wished "for everything to be done to keep her alive". Echo with  low normal EF at 50 to 55%, TDS with suboptimal views, moderate TR, and RV enlargement. She was seen by Dr. Agustin Cree due to minimally elevated troponin - no further testing felt to be needed - he felt this was the result of demand ischemia. CT of the chest with small to moderate bilateral effusions and atelectasis. Noted transaminitis - GB US was negative - she has had prior cholecystectomy.   Her DAT and DOAC were stopped at that time   Noted that she has had normal EGD and negative stool for blood noted - there was discussion of possible bone marrow biopsy to rule out MDS.    She has had 2 more admissions February 2021 - the first was for more dyspnea and altered mental status (ran out of oxygen) - required BIpap - did have AF with RVR noted - ended up having cath with PCI  Proximal / mid LAD due to chest pain The second admission was after a witnessed syncopal spell - felt to be due to hypotension - she was treated with IVF. Her Lasix was cut back.   Anemia is significant Hb down to 5 at [facility] January and transfused and has been getting iron infusions Seen by GI and Hematology/Oncology at [facility] and would need to stop DAT for colonoscopy This is not ideal due to co morbidities and recent stent Would not stop DAT until May 2021   Living with daughter [name] In wheel chair mostly   No angina Still smoking some Counseled for less than  10 minutes    Past Medical History:  Diagnosis Date  . Abnormal chest xray   . Alcohol abuse   . Back pain   . CAD (coronary artery disease)     Prev seen by [facility].  Stent to OM and LAD Last cath 6/11 cathed on June 13, [name]. 2011.  The cardiac catheterization showed 30% and 50% lesions in the mid  and distal LAD, 50% in the OM1, 40% in the OM2 with 20% in-stent  restenosis, 40% in-stent restenosis in the circumflex and no significant  disease in the RCA.  Her EF was normal.  Dr. Olevia Perches recommended a trial  of proton pump inhibitors   . Chest pain   . COPD  (chronic obstructive pulmonary disease) ([place])   . Cyanosis   . DDD (degenerative disc disease)   . Depression   . Diarrhea   . Diverticular disease   . GERD (gastroesophageal reflux disease)   . H/O: hysterectomy   . Hemorrhoids   . HTN (hypertension)   . IBS (irritable bowel syndrome)   . Insomnia   . Lymphadenitis   . Pharyngitis   . PUD (peptic ulcer disease)   . PVD (peripheral vascular disease) ([place])   . Sinusitis   . TIA (transient ischemic attack)   . Tobacco abuse   . Vertigo     Past Surgical History:  Procedure Laterality Date  . ABDOMINAL HYSTERECTOMY    . APPENDECTOMY    . BREAST LUMPECTOMY Left 2015  . CARDIAC CATHETERIZATION  2005, 2006, 2009,2011  . CHOLECYSTECTOMY    . CORONARY STENT INTERVENTION N/A 04/22/2019   Procedure: CORONARY STENT INTERVENTION;  Surgeon: Troy Sine, MD;  Location: [place] [facility];  Service: Cardiovascular;  Laterality: N/A;  . CORONARY STENT PLACEMENT     Status post balloon angiogram plasty and Cypher stent to the distal    circumflex and OM2 branch  . ESOPHAGOGASTRODUODENOSCOPY (EGD) WITH PROPOFOL Left 04/21/2019   Procedure: ESOPHAGOGASTRODUODENOSCOPY (EGD) WITH PROPOFOL;  Surgeon: Arta Silence, MD;  Location: [place];  Service: Endoscopy;  Laterality: Left;  . Hammertoe repair    . Left Bunionectomy    . LEFT HEART CATH AND CORONARY ANGIOGRAPHY N/A 04/22/2019   Procedure: LEFT HEART CATH AND CORONARY ANGIOGRAPHY;  Surgeon: Troy Sine, MD;  Location: [place] [facility];  Service: Cardiovascular;  Laterality: N/A;  . LEFT HEART CATHETERIZATION WITH CORONARY ANGIOGRAM N/A 12/03/2013   Procedure: LEFT HEART CATHETERIZATION WITH CORONARY ANGIOGRAM;  Surgeon: Leonie Man, MD;  Location: [facility] CATH LAB;  Service: Cardiovascular;  Laterality: N/A;  . TUBAL LIGATION       Medications: Current Meds  Medication Sig  . acetaminophen (TYLENOL) 325 MG tablet Take 2 tablets (650 mg total) by mouth every 4 (four) hours as  needed for headache or mild pain.  [name] albuterol (PROVENTIL) (2.5 MG/3ML) 0.083% nebulizer solution USE 1 VIAL IN NEBULIZER EVERY 4 HOURS FOR WHEEZING OR SHORTNESS OF BREATH.  [name] albuterol (VENTOLIN HFA) 108 (90 Base) MCG/ACT inhaler Inhale 2 puffs into the lungs every 6 (six) hours as needed for wheezing or shortness of breath.  [name] apixaban (ELIQUIS) 5 MG TABS tablet Take 1 tablet (5 mg total) by mouth 2 (two) times daily.  [name] aspirin 81 MG chewable tablet Chew 1 tablet (81 mg total) by mouth daily.  [name] atorvastatin (LIPITOR) 80 MG tablet Take 80 mg by mouth daily.  . blood glucose meter kit and supplies Dispense based on patient and insurance  preference. Use up to four times daily as directed. (FOR ICD-9 250.00, 250.01).  . budesonide-formoterol (SYMBICORT) 160-4.5 MCG/ACT inhaler Inhale 2 puffs into the lungs 2 (two) times daily.  . calcium carbonate (OS-CAL) 600 MG TABS Take 600 mg by mouth every morning.   . Cholecalciferol (VITAMIN D) 2000 UNITS tablet Take 2,000 Units by mouth every morning.   . clopidogrel (PLAVIX) 75 MG tablet Take 1 tablet (75 mg total) by mouth daily with breakfast.  . Dextromethorphan-Guaifenesin (MUCINEX FAST-MAX DM MAX) 5-100 MG/5ML LIQD Take 5 mLs by mouth every 4 (four) hours as needed (cough/ congestiom). 1 tsp every 4 hours as needed for cough/congestion   . diltiazem (CARDIZEM CD) 120 MG 24 hr capsule Take 120 mg by mouth daily.   [name] donepezil (ARICEPT) 5 MG tablet Take 5 mg by mouth daily.   [name] doxycycline (VIBRAMYCIN) 100 MG capsule Take 1 capsule (100 mg total) by mouth 2 (two) times daily.  . DULoxetine (CYMBALTA) 60 MG capsule Take 60 mg by mouth every morning.   . gabapentin (NEURONTIN) 300 MG capsule Take 300 mg by mouth 3 (three) times daily.   . Lancets (FREESTYLE) lancets Use as instructed  . letrozole (FEMARA) 2.5 MG tablet Take 2.5 mg by mouth every morning.   . methocarbamol (ROBAXIN) 750 MG tablet Take 750 mg by mouth every 8 (eight) hours as needed for  muscle spasms.  . metoprolol succinate (TOPROL-XL) 25 MG 24 hr tablet Take 25 mg by mouth daily.   . Multiple Vitamin (MULTIVITAMIN) tablet Take 1 tablet by mouth daily.   . nitroGLYCERIN (NITROSTAT) 0.4 MG SL tablet Place 1 tablet (0.4 mg total) under the tongue every 5 (five) minutes as needed for chest pain (may repeat x3).  . ondansetron (ZOFRAN) 8 MG tablet Take 8 mg by mouth every 4 (four) hours as needed for nausea.   . OXYGEN Use 4L 24/7  . pantoprazole (PROTONIX) 40 MG tablet Take 40 mg by mouth daily.  . potassium chloride SA (KLOR-CON M20) 20 MEQ tablet Take 1 tablet (20 mEq total) by mouth daily.  . predniSONE (DELTASONE) 10 MG tablet Take 5 tablets (50 mg total) by mouth daily for 3 days, THEN 4 tablets (40 mg total) daily for 3 days, THEN 3 tablets (30 mg total) daily for 3 days, THEN 2 tablets (20 mg total) daily for 3 days, THEN 1 tablet (10 mg total) daily for 3 days.  [name] Respiratory Therapy Supplies (FLUTTER) DEVI Use as directed  . Tiotropium Bromide Monohydrate (SPIRIVA RESPIMAT) 2.5 MCG/ACT AERS Inhale 2 puffs into the lungs daily.  . [DISCONTINUED] potassium chloride SA (KLOR-CON M20) 20 MEQ tablet Take 20 mEq by mouth daily.    Allergies: No Known Allergies  Social History: The patient  reports that she has quit smoking. She has never used smokeless tobacco. She reports current drug use. She reports that she does not drink alcohol.   Family History: The patient's family history includes Asthma in her sister; Cancer - Other in her father and mother; Coronary artery disease in an other family member; Emphysema in her father; Heart disease in her father and mother; Rheumatologic disease in her mother; Stroke in her sister.   Review of Systems: Please see the history of present illness.   All other systems are reviewed and negative.   Physical Exam: VS:  BP (!) 155/78   Pulse (!) 103   Ht 5' 5"  (1.651 m)   Wt 152 lb (68.9 kg)   SpO2  98%   BMI 25.29 kg/m  .  BMI Body  mass index is 25.29 kg/m.  Wt Readings from Last 3 Encounters:  07/01/19 152 lb (68.9 kg)  06/16/19 152 lb (68.9 kg)  05/08/19 147 lb (66.7 kg)   Chronically ill black female In wheelchair No JVP elevation No tachypnea On oxygen    LABORATORY DATA:  EKG:   06/16/19 SR rate 102 LAE otherwise normal   Lab Results  Component Value Date   WBC 13.0 (H) 06/16/2019   HGB 9.1 (L) 06/16/2019   HCT 30.5 (L) 06/16/2019   PLT 269 06/16/2019   GLUCOSE 296 (H) 06/16/2019   CHOL 154 04/23/2019   TRIG 89 04/23/2019   HDL 51 04/23/2019   LDLCALC 85 04/23/2019   ALT 45 (H) 06/08/2019   AST 34 06/08/2019   NA 137 06/16/2019   K 4.7 06/16/2019   CL 93 (L) 06/16/2019   CREATININE 0.73 06/16/2019   BUN 17 06/16/2019   CO2 36 (H) 06/16/2019   TSH 2.564 04/26/2019   INR 1.0 12/02/2013   HGBA1C 6.2 (H) 04/21/2019     BNP (last 3 results) Recent Labs    04/26/19 1824 06/08/19 0247 06/16/19 1321  BNP 110.7* 256.0* 101.4*    ProBNP (last 3 results) Recent Labs    04/15/19 1225  PROBNP 711*     Other Studies Reviewed Today:  Echo1/29/21    1. Left ventricular ejection fraction, by visual estimation, is 60 to 65%. The left ventricle has normal function. There is mildly increased left ventricular hypertrophy. 2. Left ventricular diastolic parameters are consistent with Grade I diastolic dysfunction (impaired relaxation). 3. The left ventricle has no regional wall motion abnormalities. 4. Global right ventricle has normal systolic function.The right ventricular size is normal. No increase in right ventricular wall thickness. 5. Left atrial size was normal. 6. Right atrial size was normal. 7. The mitral valve is normal in structure. No evidence of mitral valve regurgitation. 8. The tricuspid valve is grossly normal. 9. The tricuspid valve is grossly normal. Tricuspid valve regurgitation is trivial. 10. The aortic valve is normal in structure. Aortic valve regurgitation is  not visualized. 11. The pulmonic valve was normal in structure. Pulmonic valve regurgitation is not visualized. 12. Moderately elevated pulmonary artery systolic pressure. 13. Pulmonary hypertension is moderate. 14. The atrial septum is grossly normal.  CARDIAC CATH: 04/22/2019  2nd Mrg lesion is 20% stenosed.  Previously placed Mid Cx to Dist Cx stent (unknown type) is widely patent.  Prox LAD to Mid LAD lesion is 90% stenosed.  Post intervention, there is a 0% residual stenosis.  A stent was successfully placed.  There is 90% eccentric in-stent restenosis in the distal proximal LAD Cypher stent which was placed in 2005 in a large LAD system.  Patent stents in a dominant left circumflex vessel with 20% intimal hyperplasia in the stent involving the OM1 vessel and patent stent in the AV groove circumflex.  Nondominant normal RCA.  LVEDP 2 mm Hg.  Successful percutaneous coronary intervention to the previously placed 3.0 x 23 mm Cypher stent in the proximal LAD with 90% in-stent restenosis in the distal aspect of the stent with mild narrowing just beyond the stent treated with multiple cuts with a Wolverine cutting balloon 2.5 x 10 mm, and ultimate stenting with a 3.0 x 18 mm Resolute Onyx stent postdilated to 3.25 mm with the stenosis being reduced to 0%.  RECOMMENDATION: DAPT therapy initially with aspirin/Plavix. If anticoagulation is to  be started per primary team due to recent PAF, aspirin can be discontinued after 4 weeks of therapy.  ASSESSMENT AND PLAN:  1. COPD:  DNI / palliative care per primary and pulmonary still smoking  On 4 L oxygen Has had COVID vaccine   2. CAD:  Cath 04/22/19 patent stents to circumflex/OM1 new stent to proximal LAD completely revascularized continue DAT   3. AF with RVR -  Unable to do ECG no anticoagulation with anemia/GI bleeding   4. HTN - been low with FTT ARB and lasix d/c    5. Anemia - unclear etiology - had normal EGD -  stool negative for blood - seen by GI at [facility] Do not think she is a good candidate for colonoscopy may consider virtual colonic CT  Note abdominal CT done 06/08/19 with no acute findings post cholecystectomy/ diverticulosis with no inflammation F/U with Dr Reesa Chew GI [facility]   6. Carotid disease - 60-79% RICA by duplex 02/08/18 given other comorbid conditions will not repeat   7. HlD - on statin    12. COVID-19 Education: The signs and symptoms of COVID-19 were discussed with the patient and how to seek care for testing (follow up with PCP or arrange E-visit).  The importance of social distancing, staying at home, hand hygiene and wearing a mask when out in public were discussed today.  Current medicines are reviewed with the patient today.  The patient does not have concerns regarding medicines other than what has been noted above.  The following changes have been made:  See above.  Labs/ tests ordered today include:   None  No orders of the defined types were placed in this encounter.    Disposition:   FU  With [place] in 6-8 weeks    Patient is agreeable to this plan and will call if any problems develop in the interim.   Signed: Jenkins Rouge, MD  07/01/2019 9:05 AM  [place] [address] [place], [place]  [address] Phone: [phone] Fax: [phone]

## 2019-07-01 ENCOUNTER — Telehealth (INDEPENDENT_AMBULATORY_CARE_PROVIDER_SITE_OTHER): Payer: Medicare Other | Admitting: Cardiovascular Disease

## 2019-07-01 VITALS — BP 155/78 | HR 103 | Ht 65.0 in | Wt 152.0 lb

## 2019-07-01 DIAGNOSIS — Z9981 Dependence on supplemental oxygen: Secondary | ICD-10-CM

## 2019-07-01 DIAGNOSIS — I10 Essential (primary) hypertension: Secondary | ICD-10-CM

## 2019-07-01 DIAGNOSIS — Z7982 Long term (current) use of aspirin: Secondary | ICD-10-CM | POA: Diagnosis not present

## 2019-07-01 DIAGNOSIS — Z87891 Personal history of nicotine dependence: Secondary | ICD-10-CM | POA: Diagnosis not present

## 2019-07-01 DIAGNOSIS — I480 Paroxysmal atrial fibrillation: Secondary | ICD-10-CM

## 2019-07-01 DIAGNOSIS — I4891 Unspecified atrial fibrillation: Secondary | ICD-10-CM

## 2019-07-01 DIAGNOSIS — J449 Chronic obstructive pulmonary disease, unspecified: Secondary | ICD-10-CM | POA: Diagnosis not present

## 2019-07-01 DIAGNOSIS — I2510 Atherosclerotic heart disease of native coronary artery without angina pectoris: Secondary | ICD-10-CM

## 2019-07-01 DIAGNOSIS — D649 Anemia, unspecified: Secondary | ICD-10-CM

## 2019-07-01 DIAGNOSIS — E785 Hyperlipidemia, unspecified: Secondary | ICD-10-CM

## 2019-07-01 MED ORDER — POTASSIUM CHLORIDE CRYS ER 20 MEQ PO TBCR: 20.0000 meq | EXTENDED_RELEASE_TABLET | Freq: Every day | ORAL | 3 refills | Status: AC

## 2019-07-01 NOTE — Patient Instructions (Addendum)
Medication Instructions:  *If you need a refill on your cardiac medications before your next appointment, please call your pharmacy*  Lab Work: If you have labs (blood work) drawn today and your tests are completely normal, you will receive your results only by: [name] MyChart Message (if you have MyChart) OR . A paper copy in the mail If you have any lab test that is abnormal or we need to change your treatment, we will call you to review the results.  Testing/Procedures: None ordered today.  Follow-Up: At [facility], you and your health needs are our priority.  As part of our continuing mission to provide you with exceptional heart care, we have created designated Provider Care Teams.  These Care Teams include your primary Cardiologist (physician) and Advanced Practice Providers (APPs -  Physician Assistants and Nurse Practitioners) who all work together to provide you with the care you need, when you need it.  We recommend signing up for the patient portal called "MyChart".  Sign up information is provided on this After Visit Summary.  MyChart is used to connect with patients for Virtual Visits (Telemedicine).  Patients are able to view lab/test results, encounter notes, upcoming appointments, etc.  Non-urgent messages can be sent to your provider as well.   To learn more about what you can do with MyChart, go to [email or web address].    Your next appointment:   6 to 8 weeks  The format for your next appointment:   In Person  Provider:   You may see Charlton Haws, MD or one of the following Advanced Practice Providers on your designated Care Team:    Norma Fredrickson, NP  Nada Boozer, NP  Georgie Chard, NP

## 2019-07-15 ENCOUNTER — Telehealth: Payer: Self-pay | Admitting: Internal Medicine

## 2019-07-15 ENCOUNTER — Ambulatory Visit: Payer: Medicare Other | Admitting: Internal Medicine

## 2019-07-15 NOTE — Telephone Encounter (Signed)
Attempted to call Burna Mortimer but unable to reach. Left message for her to return call.

## 2019-07-15 NOTE — Telephone Encounter (Signed)
By prior pfts she was only a gold 2 but has continued to smoke and now hypoxemic/hypercabic so technically gold IV / very severe copd and hosp appropriate. They should have access to last admit and my last office note that speak for themselves

## 2019-07-15 NOTE — Telephone Encounter (Signed)
Burna Mortimer called back-- needs to know Ms. Cabal's prognosis and what stage COPD she is in-- please return call and leave detailed message with this info-- ultimately Burna Mortimer is looking to find out if pt could qualify for hospice  Best number: [phone]

## 2019-07-15 NOTE — Telephone Encounter (Signed)
Pt's daughter is wanting to see if pt's qualifies for Hospice. Hospice is needing some information from [place]. What is her prognosis and what stage COPD is she in?  MW - please advise. Thanks.

## 2019-07-15 NOTE — Telephone Encounter (Signed)
Called spoke with Leeroy Bock (dpr on file) who reported that patient is now living with her because of decline in status and patient's PCP has rx'd low dose liquid Morphine to help with dyspnea.  Leeroy Bock is trying to assist with getting patient admitted to Hospice or Palliative due to decline and to assist with symptom management.  Verified Wanda's phone number with Palliative.  LMOM TCB x1 for Ramiro Harvest NP with Palliative.

## 2019-07-15 NOTE — Telephone Encounter (Signed)
Burna Mortimer returned call  Spoke with Burna Mortimer and discussed Dr Thurston Hole recommendations about patient's COPD stage, prognosis.  Burna Mortimer voiced her understanding and stated that she will call patient's daughter Leeroy Bock to discuss.  Burna Mortimer stated that she may call back for an order.  Nothing further needed at this time; will sign off.

## 2019-07-16 ENCOUNTER — Telehealth: Payer: Self-pay | Admitting: Internal Medicine

## 2019-07-16 NOTE — Telephone Encounter (Signed)
Fine with me if hospice unable to provide one or PCP not available for this

## 2019-07-16 NOTE — Telephone Encounter (Signed)
Spoke with Terri. She is aware that MW has agreed to be the attending physician. She has requested to have a copy of this phone note as well as the phone note from [place] (yesterday) faxed to the [place] as she is not able to access Epic. Advised her that I would go ahead and fax these documents to her.   Nothing further needed at time of call.

## 2019-07-16 NOTE — Telephone Encounter (Signed)
Dr Sherene Sires- do you want to be her attending MD for Hospice?

## 2019-07-17 ENCOUNTER — Telehealth: Payer: Self-pay | Admitting: Internal Medicine

## 2019-07-17 NOTE — Telephone Encounter (Signed)
Terri called back-- please return call.

## 2019-07-17 NOTE — Telephone Encounter (Signed)
I called Patricia Wall again and again there was no answer- can someone at the [facility] office see if she has faxed anything please?

## 2019-07-17 NOTE — Telephone Encounter (Signed)
Camelia Eng is returning phone call. Terri phone number is [phone]. Hospice order signed by Dr. Sherene Sires with diagnosis.

## 2019-07-17 NOTE — Telephone Encounter (Signed)
Did they fax something? LMTCB for Terri

## 2019-07-17 NOTE — Telephone Encounter (Signed)
LMTCB for Patricia Wall

## 2019-07-19 NOTE — Telephone Encounter (Signed)
I have tried to locate the fax here in the office and I was unsuccessful. Tried to call Terri with Hospice, there was no answer.

## 2019-07-22 ENCOUNTER — Encounter: Payer: Self-pay | Admitting: Primary Care

## 2019-07-22 ENCOUNTER — Ambulatory Visit (INDEPENDENT_AMBULATORY_CARE_PROVIDER_SITE_OTHER): Payer: Medicare Other | Admitting: Primary Care

## 2019-07-22 ENCOUNTER — Other Ambulatory Visit: Payer: Self-pay

## 2019-07-22 DIAGNOSIS — J9611 Chronic respiratory failure with hypoxia: Secondary | ICD-10-CM | POA: Diagnosis not present

## 2019-07-22 DIAGNOSIS — I209 Angina pectoris, unspecified: Secondary | ICD-10-CM | POA: Diagnosis not present

## 2019-07-22 DIAGNOSIS — J449 Chronic obstructive pulmonary disease, unspecified: Secondary | ICD-10-CM | POA: Diagnosis not present

## 2019-07-22 DIAGNOSIS — J9612 Chronic respiratory failure with hypercapnia: Secondary | ICD-10-CM

## 2019-07-22 MED ORDER — NITROGLYCERIN 0.4 MG SL SUBL: 0.4000 mg | SUBLINGUAL_TABLET | SUBLINGUAL | 0 refills | Status: AC | PRN

## 2019-07-22 MED ORDER — ALBUTEROL SULFATE HFA 108 (90 BASE) MCG/ACT IN AERS: 2.0000 | INHALATION_SPRAY | Freq: Four times a day (QID) | RESPIRATORY_TRACT | 3 refills | Status: AC | PRN

## 2019-07-22 NOTE — Progress Notes (Signed)
_0  ID: Patricia Wall, female    DOB: May 27, 1950, 69 y.o.   MRN: [id number]  Chief Complaint  Patient presents with  . Follow-up    Finished Pred. taper 1 wk. sob slightly better, non-prod. cough occass    Referring provider: Bernerd Limbo, MD  HPI: 69 year old female, former smoker. PMH significant for COPD GOLD II, chronic respiratory failure, CAD, afib, acute diastolic CHF. Patient of Dr. Melvyn Novas, last seen on 04/12/19. Maintained on Symbicort 160 and Spiriva. Oxygen dependent on    07/22/2019 Patient presents today for regular follow-up. Accompanied by her daughter whom she lives with. She had a hospitalization end of March for COPD exacerbation. Continues Symbicort and Spiriva. Reports no issues with taking her inhalers. Able to get a good deeps. She uses her albuterol nebulizer 2-3 times a day. She has rescue inhaler to use on hand if out of her house. Wears ventilator at night with oxygen, [facility] manages this through pallative care. No significant cough. She was started on oral morphine for shortness of breath by her family medical doctor 1-2 week which has helped. She is taking  0.52m  2-3 times a day as needed for her shortness of breath . Pallative care is following patient, they have started to disscuss home hospice. She is eating well. Reports some nausea with movement/transfers. She has not needed to use prn zofran. She had a small fall 2-3 weeks ago. Uses a walker but mainly family helps assist with transfers.  They do not feel that she needs any additional services at this time. Needs refill of Ventolin rescue inhaler and nitroglycerin. She has not have any recent chest pain and has not needed to use nitro in months. Needs refill because her last prescription was left at her hold place of residence.  No Known Allergies  Immunization History  Administered Date(s) Administered  . H1N1 04/15/2008  . Influenza Split 12/19/2008, 02/24/2012, 01/02/2013, 12/25/2013,  12/29/2014, 11/28/2015  . Influenza Whole 02/14/2006, 12/27/2012, 11/20/2015  . Influenza, High Dose Seasonal PF 12/23/2016, 12/06/2017  . Influenza, Seasonal, Injecte, Preservative Fre 12/25/2013, 12/29/2014, 11/28/2015  . Influenza,inj,Quad PF,6+ Mos 12/09/2014  . Influenza-Unspecified 12/25/2013, 12/29/2014, 11/28/2015  . PFIZER SARS-COV-2 Vaccination 05/01/2019, 05/23/2019  . Pneumococcal Conjugate-13 12/27/2008, 10/15/2013, 04/03/2014  . Pneumococcal Polysaccharide-23 04/22/2003, 05/19/2005, 05/23/2005, 12/25/2008, 01/20/2015  . Pneumococcal-Unspecified 04/22/2003, 05/19/2005, 05/23/2005, 12/25/2008, 01/20/2015  . Td 04/15/2008, 12/19/2008  . Tdap 09/16/2007  . Zoster 10/02/2014    Past Medical History:  Diagnosis Date  . Abnormal chest xray   . Alcohol abuse   . Back pain   . CAD (coronary artery disease)     [facility]  [facility] to OM and LAD Last cath 6/11 cathed on June 13, [name]. 2011.  The cardiac catheterization showed 30% and 50% lesions in the mid  and distal LAD, 50% in the OM1, 40% in the OM2 with 20% in-stent  restenosis, 40% in-stent restenosis in the circumflex and no significant  disease in the RCA.  Her EF was normal.  Dr. BOlevia Perchesrecommended a trial  of proton pump inhibitors   . Chest pain   . [place]   . Cyanosis   . DDD (degenerative disc disease)   . Depression   . Diarrhea   . Diverticular disease   . GERD (gastroesophageal reflux disease)   . H/O: hysterectomy   . Hemorrhoids   . HTN (hypertension)   . IBS (irritable bowel syndrome)   .  Insomnia   . Lymphadenitis   . Pharyngitis   . PUD (peptic ulcer disease)   . PVD (peripheral vascular disease) ([place])   . Sinusitis   . TIA (transient ischemic attack)   . Tobacco abuse   . Vertigo     Tobacco History: Social History   Tobacco Use  Smoking Status Former Smoker  . Packs/day: 0.25  . Years: 59.00  . Pack years: 14.75  . Types: Cigarettes    Smokeless Tobacco Never Used   Counseling given: Not Answered   Outpatient Medications Prior to Visit  Medication Sig Dispense Refill  . acetaminophen (TYLENOL) 325 MG tablet Take 2 tablets (650 mg total) by mouth every 4 (four) hours as needed for headache or mild pain. 30 tablet 0  . acidophilus (RISAQUAD) CAPS capsule Take 1 capsule by mouth 2 (two) times daily.    [name] albuterol (PROVENTIL) (2.5 MG/3ML) 0.083% nebulizer solution USE 1 VIAL IN NEBULIZER EVERY 4 HOURS FOR WHEEZING OR SHORTNESS OF BREATH. 300 mL 2  . apixaban (ELIQUIS) 5 MG TABS tablet Take 1 tablet (5 mg total) by mouth 2 (two) times daily. 60 tablet 5  . atorvastatin (LIPITOR) 80 MG tablet Take 80 mg by mouth daily.    . blood glucose meter kit and supplies Dispense based on patient and insurance preference. Use up to four times daily as directed. (FOR ICD-9 250.00, 250.01). 1 each 0  . budesonide-formoterol (SYMBICORT) 160-4.5 MCG/ACT inhaler Inhale 2 puffs into the lungs 2 (two) times daily. 10.6 g 5  . calcium carbonate (OS-CAL) 600 MG TABS Take 600 mg by mouth every morning.     . Cholecalciferol (VITAMIN D) 2000 UNITS tablet Take 2,000 Units by mouth every morning.     . clopidogrel (PLAVIX) 75 MG tablet Take 1 tablet (75 mg total) by mouth daily with breakfast. 90 tablet 3  . Dextromethorphan-Guaifenesin (MUCINEX FAST-MAX DM MAX) 5-100 MG/5ML LIQD Take 5 mLs by mouth every 4 (four) hours as needed (cough/ congestiom). 1 tsp every 4 hours as needed for cough/congestion     . diltiazem (CARDIZEM CD) 120 MG 24 hr capsule Take 120 mg by mouth daily.     [name] donepezil (ARICEPT) 5 MG tablet Take 5 mg by mouth daily.     . DULoxetine (CYMBALTA) 60 MG capsule Take 60 mg by mouth every morning.     . gabapentin (NEURONTIN) 300 MG capsule Take 300 mg by mouth 3 (three) times daily.     . Lancets (FREESTYLE) lancets Use as instructed 100 each 12  . letrozole (FEMARA) 2.5 MG tablet Take 2.5 mg by mouth every morning.     .  methocarbamol (ROBAXIN) 750 MG tablet Take 750 mg by mouth every 8 (eight) hours as needed for muscle spasms.    . metoprolol succinate (TOPROL-XL) 25 MG 24 hr tablet Take 25 mg by mouth daily.     [name] morphine 20 MG/5ML solution Taking 0.38m every 2 hours as needed    . Multiple Vitamin (MULTIVITAMIN) tablet Take 1 tablet by mouth daily.     . ondansetron (ZOFRAN) 8 MG tablet Take 8 mg by mouth every 4 (four) hours as needed for nausea.     . OXYGEN Use 4L 24/7    . pantoprazole (PROTONIX) 40 MG tablet Take 40 mg by mouth daily.    . potassium chloride SA (KLOR-CON M20) 20 MEQ tablet Take 1 tablet (20 mEq total) by mouth daily. 90 tablet 3  . Tiotropium Bromide Monohydrate (  SPIRIVA RESPIMAT) 2.5 MCG/ACT AERS Inhale 2 puffs into the lungs daily. 1 Inhaler 5  . albuterol (VENTOLIN HFA) 108 (90 Base) MCG/ACT inhaler Inhale 2 puffs into the lungs every 6 (six) hours as needed for wheezing or shortness of breath. 1 Inhaler 2  . nitroGLYCERIN (NITROSTAT) 0.4 MG SL tablet Place 1 tablet (0.4 mg total) under the tongue every 5 (five) minutes as needed for chest pain (may repeat x3). 25 tablet 2  . aspirin 81 MG chewable tablet Chew 1 tablet (81 mg total) by mouth daily. (Patient not taking: Reported on 07/22/2019) 30 tablet 0  . Respiratory Therapy Supplies (FLUTTER) DEVI Use as directed (Patient not taking: Reported on 07/22/2019) 1 each 0  . doxycycline (VIBRAMYCIN) 100 MG capsule Take 1 capsule (100 mg total) by mouth 2 (two) times daily. (Patient not taking: Reported on 07/22/2019) 14 capsule 0   No facility-administered medications prior to visit.    Review of Systems  Review of Systems  Respiratory: Positive for shortness of breath.   Cardiovascular: Positive for leg swelling.    Physical Exam  BP 108/60 (BP Location: Right Arm, Cuff Size: Normal)   Pulse 80   Temp (!) 97 F (36.1 C) (Temporal)   Ht _0  (1.676 m)   Wt 148 lb 12.8 oz (67.5 kg)   SpO2 99%   BMI 24.02 kg/m  Physical  Exam Constitutional:      Appearance: Normal appearance.  HENT:     Head: Normocephalic and atraumatic.     Mouth/Throat:     Mouth: Mucous membranes are moist.     Pharynx: Oropharynx is clear.  Cardiovascular:     Rate and Rhythm: Normal rate and regular rhythm.     Comments: +1 BLE edema Pulmonary:     Effort: Pulmonary effort is normal.     Comments: Faint wheezing right mid-lobe Musculoskeletal:     Comments: In WC  Neurological:     General: No focal deficit present.     Mental Status: She is alert and oriented to person, place, and time. Mental status is at baseline.  Psychiatric:        Mood and Affect: Mood normal.        Behavior: Behavior normal.        Thought Content: Thought content normal.        Judgment: Judgment normal.      Lab Results:  CBC    Component Value Date/Time   WBC 13.0 (H) 06/16/2019 1321   RBC 3.14 (L) 06/16/2019 1321   HGB 9.1 (L) 06/16/2019 1321   HGB 9.0 (L) 05/08/2019 1206   HCT 30.5 (L) 06/16/2019 1321   HCT 27.0 (L) 05/08/2019 1206   PLT 269 06/16/2019 1321   PLT 219 05/08/2019 1206   MCV 97.1 06/16/2019 1321   MCV 87 05/08/2019 1206   MCH 29.0 06/16/2019 1321   MCHC 29.8 (L) 06/16/2019 1321   RDW 15.9 (H) 06/16/2019 1321   RDW 19.2 (H) 05/08/2019 1206   LYMPHSABS 1.4 06/16/2019 1321   MONOABS 1.1 (H) 06/16/2019 1321   EOSABS 0.1 06/16/2019 1321   BASOSABS 0.0 06/16/2019 1321    BMET    Component Value Date/Time   NA 137 06/16/2019 1321   NA 137 05/08/2019 1206   K 4.7 06/16/2019 1321   CL 93 (L) 06/16/2019 1321   CO2 36 (H) 06/16/2019 1321   GLUCOSE 296 (H) 06/16/2019 1321   BUN 17 06/16/2019 1321   BUN 25 05/08/2019  1206   CREATININE 0.73 06/16/2019 1321   CALCIUM 9.1 06/16/2019 1321   GFRNONAA >60 06/16/2019 1321   GFRAA >60 06/16/2019 1321    BNP    Component Value Date/Time   BNP 101.4 (H) 06/16/2019 1321    ProBNP    Component Value Date/Time   PROBNP 711 (H) 04/15/2019 1225   PROBNP 29.0  10/02/2013 1000    Imaging: No results found.   Assessment & Plan:   COPD GOLD II / still smoking  -  Moderate to severe obstruction on PFTs in 2014/ FEV1 53%, ratio 54 - GROUP D in terms of symptoms. Following with palliative care for end-stage COPD, discussing hospice. Started on morphine oral solution 0.73m every 2 hours prn shortness of breath by Dr. BColetta Memos PCP - Continue Symbicort 160 2bid; Spiriva Respimat 2.516m daily  - Continue Albuterol nebulizer every 6 hours for shortness of breath or wheezing  - Follow-up 6 months with Dr. WeMelvyn NovasChronic respiratory failure with hypoxia and hypercapnia (HCC) - Oxygen dependent, O2 99% on 4L pulsed   Angina, class III - No active chest pain. Needs refill Nitroglycerin 0.70m32mL every 5 mins prn chest discomfort    EliMartyn EhrichP 07/23/2019

## 2019-07-22 NOTE — Telephone Encounter (Signed)
LMTCB for Terri and will close per protocol

## 2019-07-22 NOTE — Patient Instructions (Addendum)
Recommendations: Continue Spiriva two puffs once daily  Continue Symbicort two puffs twice daily  Continue Albuterol nebulizer every 6 hours for shortness of breath or wheezing  Continue Morphine as needed every 2 hours for shortness of breath per family medical doctor/pallative   RX: Refilled Ventolin rescue inhaler Refill nitroglycerin   Follow-up: 6 months with Dr. Sherene Sires or sooner if needed

## 2019-07-23 ENCOUNTER — Encounter: Payer: Self-pay | Admitting: Primary Care

## 2019-07-23 NOTE — Assessment & Plan Note (Addendum)
-   No active chest pain. Needs refill Nitroglycerin 0.4mg  SL every 5 mins prn chest discomfort

## 2019-07-23 NOTE — Assessment & Plan Note (Addendum)
-    Moderate to severe obstruction on PFTs in 2014/ FEV1 53%, ratio 54 - GROUP D in terms of symptoms. Following with palliative care for end-stage COPD, discussing hospice. Started on morphine oral solution 0.31ml every 2 hours prn shortness of breath by Dr. Everlene Other, PCP - Continue Symbicort 160 2bid; Spiriva Respimat 2.17mcg daily  - Continue Albuterol nebulizer every 6 hours for shortness of breath or wheezing  - Follow-up 6 months with Dr. Sherene Sires

## 2019-07-23 NOTE — Assessment & Plan Note (Signed)
-   Oxygen dependent, O2 99% on 4L pulsed

## 2019-08-07 NOTE — Progress Notes (Signed)
CARDIOLOGY OFFICE NOTE  Date:  08/13/2019    [name] Date of Birth: 12/29/1950 Medical Record #[id number]  PCP:  Bernerd Limbo, MD  Cardiologist:  Gillian Shields   Chief Complaint  Patient presents with  . Follow-up    Seen for Dr. Johnsie Cancel    History of Present Illness: Patricia Wall is a 69 y.o. female who presents today for a follow up visit. Seen for Dr. Johnsie Cancel.   She has a history of known CAD withmultiple stenting, PVD andprogressiveCOPD on home supplemental O2, 4Lwith ongoing smoking use.   She had stenting to LAD, distal LCx and OM2 in 2005, in-stent restenosis with balloon in 2006, cath with patent stents in 2011. Had recurrent chest pain and underwent re-stenting to LCx in 2015. PVD hx includes 60-79%LICA stenosis that has not been checked since 2016.Echocardiogramfrom 2016 and EF 65-70% estimated PA 38 mmHg no valve disease.  Seen by Dr. Johnsie Cancel in February of 2020. Saw Kathyrn Drown, NP back in August - intermittent chest pain with NTG use noted. She was not interested in further testing at that time given her severe COPD. Also continued to smoke 2 to 3 cigarettes/day. She opted to maintain her current status.  She was admitted to [facility] in December of 2020. Did not have records until after her visit with me. She had had PAF and diastolic HF - in the setting of acute hypoxemic respiratory failure - COVID negative.  Decision to start anticoagulation deferred to [place]. She had profound anemia (HGB 6.2)- required transfusion. Balance very poor. Falling. Aspirin and Plavix were restarted at discharge. Social situation quite challenging - she lives with her husband who has had a stroke - daughter here in [place] taking care of her disabled husband. Another daughter with leukemia. She did express desire to remain a full code and wished "for everything to be done to keep her alive" at that visit.Echo with low normal EF at 50 to 55%, TDS with suboptimal  views, moderate TR, and RV enlargement. She was seen by Dr. Agustin Cree due to minimally elevated troponin - no further testing felt to be needed - he felt this was the result of demand ischemia. CT of the chest with small to moderate bilateral effusions and atelectasis. Noted transaminitis - GB US was negative - she has had prior cholecystectomy.   Discussed her situation with Dr. Johnsie Cancel - due to ongoing anemia - we stopped all anticoagulation - no aspirin, no Plavix and no plans to start Eliquis/OAC therapy. She was deemed high risk for further procedures and needed to follow back up with PCP and GI.   Noted that she has had normal EGD and negative stool for blood noted - there was discussion of possible bone marrow biopsy to rule out MDS.    She had had 2 more admissions earlier this year - the first was for more dyspnea and altered mental status (ran out of oxygen) - required BIpap - did have AF with RVR noted - ended up having cath with PCI due to chest pain - now on triple therapy anticoagulation. The second admission was after a witnessed syncopal spell - felt to be due to hypotension - she was treated with IVF. Her Lasix was cut back.   I saw her later in February - her daughter Patricia Wall provides most of the history - patient now living with her. Continued to feel poorly. Lots of dizziness. Still smoking some. Palliative care had been called.  She had a telehealth visit with Dr. Johnsie Cancel last month. Looks to now be DNI. Still with anemia issues - not felt to be a good candidate for colonoscopy per Dr. Johnsie Cancel. No plans to repeat carotid duplex study given her co-morbid issues.   The patient does not have symptoms concerning for COVID-19 infection (fever, chills, cough, or new shortness of breath).   Comes in today. Here with her daughter Patricia Wall - she augments the history. She lives with Patricia Wall - this will be permanently. Was at Hematology/Oncology yesterday with [place] - labs done - HGB is now  10.9. She continues to feel poorly. She remains dizzy. She has a great appetite. Still losing weight - down 20 more pounds over past 6 weeks. No chest pain. Has a stabbing pain in the left upper quadrant - this comes and goes - very bothersome for her. Remains short of breath. Apparently had conversation to start hospice - she did not wish to do this yet.   Past Medical History:  Diagnosis Date  . Abnormal chest xray   . ABUSE, ALCOHOL, UNSPECIFIED 09/04/2006   Qualifier: Diagnosis of  By: Amil Amen MD, Benjamine Mola    . Acute diastolic CHF (congestive heart failure) ([place])   . Acute on chronic respiratory failure with hypercapnia ([place])   . Acute respiratory failure with hypoxia ([place]) 01/07/2015  . Alcohol abuse   . Angina, class III ([place]) 03/29/2007   Qualifier: Diagnosis of  By: Amil Amen MD, Benjamine Mola    . ARF (acute renal failure) ([place]) 04/27/2019  . Back pain   . CAD (coronary artery disease)     Prev seen by [facility].  Stent to OM and LAD Last cath 6/11 cathed on June 13, [name]. 2011.  The cardiac catheterization showed 30% and 50% lesions in the mid  and distal LAD, 50% in the OM1, 40% in the OM2 with 20% in-stent  restenosis, 40% in-stent restenosis in the circumflex and no significant  disease in the RCA.  Her EF was normal.  Dr. Olevia Perches recommended a trial  of proton pump inhibitors   . CAD S/P percutaneous coronary angioplasty: Prior Cypher DES to pLAD, pOM2 & AVG Cx (after Om2); New DES to AVG Cx ISR & Angiosculpt PTCA of Ostial AVG Cx from OM2. 09/04/2006   Qualifier: Diagnosis of  By: Amil Amen MD, Benjamine Mola    . Chest pain   . COPD (chronic obstructive pulmonary disease) ([place])   . COPD GOLD II / still smoking  09/04/2006   Followed in Pulmonary clinic/ [facility]/ [place]  Active smoker  - pft's 12/12/12 FEV1  1.41 (53%) ratio 54 with no significant reversibility and DLCO 47% corrects to 50 01/16/2013  Walked RA x 2 laps @ 185 ft each stopped due to sob, no desat     - Trial of anoro  01/16/2013 > helped but insurance would not pay - incruse one daily started 10/02/2013 >>>not covered  -Spiriva restarted 10/15/13 >  . Cyanosis   . DDD (degenerative disc disease)   . DEGENERATIVE DISC DISEASE, CERVICAL SPINE 09/04/2006   Annotation: c6-7 on the right Qualifier: Diagnosis of  By: Amil Amen MD, Benjamine Mola    . Depression   . Diarrhea   . Diverticular disease   . DIVERTICULOSIS, COLON 09/04/2006   Qualifier: Diagnosis of  By: Amil Amen MD, Benjamine Mola    . GERD (gastroesophageal reflux disease)   . H/O: hysterectomy   . Hemorrhoids   . HTN (hypertension)   . IBS (irritable bowel syndrome)   .  Insomnia   . Irritable bowel syndrome 09/04/2006   Qualifier: Diagnosis of  By: Amil Amen MD, Benjamine Mola    . Lymphadenitis   . PEPTIC ULCER DISEASE 09/04/2006   Annotation: distant history of  Qualifier: Diagnosis of  By: Amil Amen MD, Benjamine Mola    . Pharyngitis   . PHARYNGITIS 07/18/2007   Qualifier: Diagnosis of  By: Radene Ou MD, [place]    . PUD (peptic ulcer disease)   . PVD (peripheral vascular disease) ([place])   . Sinusitis   . TIA (transient ischemic attack)   . Tobacco abuse   . Vertigo     Past Surgical History:  Procedure Laterality Date  . ABDOMINAL HYSTERECTOMY    . APPENDECTOMY    . BREAST LUMPECTOMY Left 2015  . CARDIAC CATHETERIZATION  2005, 2006, 2009,2011  . CHOLECYSTECTOMY    . CORONARY STENT INTERVENTION N/A 04/22/2019   Procedure: CORONARY STENT INTERVENTION;  Surgeon: Troy Sine, MD;  Location: [facility];  Service: Cardiovascular;  Laterality: N/A;  . CORONARY STENT PLACEMENT     Status post balloon angiogram plasty and Cypher stent to the distal    circumflex and OM2 branch  . ESOPHAGOGASTRODUODENOSCOPY (EGD) WITH PROPOFOL Left 04/21/2019   Procedure: ESOPHAGOGASTRODUODENOSCOPY (EGD) WITH PROPOFOL;  Surgeon: Arta Silence, MD;  Location: [place];  Service: Endoscopy;  Laterality: Left;  . Hammertoe repair    . Left Bunionectomy    . LEFT  HEART CATH AND CORONARY ANGIOGRAPHY N/A 04/22/2019   Procedure: LEFT HEART CATH AND CORONARY ANGIOGRAPHY;  Surgeon: Troy Sine, MD;  Location: [facility];  Service: Cardiovascular;  Laterality: N/A;  . LEFT HEART CATHETERIZATION WITH CORONARY ANGIOGRAM N/A 12/03/2013   Procedure: LEFT HEART CATHETERIZATION WITH CORONARY ANGIOGRAM;  Surgeon: Leonie Man, MD;  Location: [facility];  Service: Cardiovascular;  Laterality: N/A;  . TUBAL LIGATION       Medications: Current Meds  Medication Sig  . acetaminophen (TYLENOL) 325 MG tablet Take 2 tablets (650 mg total) by mouth every 4 (four) hours as needed for headache or mild pain.  [name] acidophilus (RISAQUAD) CAPS capsule Take 1 capsule by mouth 2 (two) times daily.  [name] albuterol (PROVENTIL) (2.5 MG/3ML) 0.083% nebulizer solution USE 1 VIAL IN NEBULIZER EVERY 4 HOURS FOR WHEEZING OR SHORTNESS OF BREATH.  [name] albuterol (VENTOLIN HFA) 108 (90 Base) MCG/ACT inhaler Inhale 2 puffs into the lungs every 6 (six) hours as needed for wheezing or shortness of breath.  [name] apixaban (ELIQUIS) 5 MG TABS tablet Take 1 tablet (5 mg total) by mouth 2 (two) times daily.  [name] atorvastatin (LIPITOR) 80 MG tablet Take 80 mg by mouth daily.  . blood glucose meter kit and supplies Dispense based on patient and insurance preference. Use up to four times daily as directed. (FOR ICD-9 250.00, 250.01).  . budesonide-formoterol (SYMBICORT) 160-4.5 MCG/ACT inhaler Inhale 2 puffs into the lungs 2 (two) times daily.  . calcium carbonate (OS-CAL) 600 MG TABS Take 600 mg by mouth every morning.   . Cholecalciferol (VITAMIN D) 2000 UNITS tablet Take 2,000 Units by mouth every morning.   . clopidogrel (PLAVIX) 75 MG tablet Take 1 tablet (75 mg total) by mouth daily with breakfast.  . Dextromethorphan-Guaifenesin (MUCINEX FAST-MAX DM MAX) 5-100 MG/5ML LIQD Take 5 mLs by mouth every 4 (four) hours as needed (cough/ congestiom). 1 tsp every 4 hours as needed for cough/congestion   .  diltiazem (CARDIZEM CD) 120 MG 24 hr capsule Take 120 mg by mouth  daily.   . donepezil (ARICEPT) 5 MG tablet Take 5 mg by mouth daily.   . DULoxetine (CYMBALTA) 60 MG capsule Take 60 mg by mouth every morning.   . gabapentin (NEURONTIN) 300 MG capsule Take 300 mg by mouth 3 (three) times daily.   . Lancets (FREESTYLE) lancets Use as instructed  . letrozole (FEMARA) 2.5 MG tablet Take 2.5 mg by mouth every morning.   . methocarbamol (ROBAXIN) 750 MG tablet Take 750 mg by mouth every 8 (eight) hours as needed for muscle spasms.  . metoprolol succinate (TOPROL-XL) 25 MG 24 hr tablet Take 25 mg by mouth daily.   [name] morphine 20 MG/5ML solution Taking 0.74m every 2 hours as needed  . Multiple Vitamin (MULTIVITAMIN) tablet Take 1 tablet by mouth daily.   . nitroGLYCERIN (NITROSTAT) 0.4 MG SL tablet Place 1 tablet (0.4 mg total) under the tongue every 5 (five) minutes as needed for chest pain (may repeat x3).  . OXYGEN Use 4L 24/7  . pantoprazole (PROTONIX) 40 MG tablet Take 40 mg by mouth daily.  . potassium chloride SA (KLOR-CON M20) 20 MEQ tablet Take 1 tablet (20 mEq total) by mouth daily.  .Marland KitchenRespiratory Therapy Supplies (FLUTTER) DEVI Use as directed  . Tiotropium Bromide Monohydrate (SPIRIVA RESPIMAT) 2.5 MCG/ACT AERS Inhale 2 puffs into the lungs daily.     Allergies: No Known Allergies  Social History: The patient  reports that she has quit smoking. Her smoking use included cigarettes. She has a 14.75 pack-year smoking history. She has never used smokeless tobacco. She reports current drug use. She reports that she does not drink alcohol.   Family History: The patient's family history includes Asthma in her sister; Cancer - Other in her father and mother; Coronary artery disease in an other family member; Emphysema in her father; Heart disease in her father and mother; Rheumatologic disease in her mother; Stroke in her sister.   Review of Systems: Please see the history of present  illness.   All other systems are reviewed and negative.   Physical Exam: VS:  BP (!) 116/58   Pulse (!) 102   Ht 5' 6"  (1.676 m)   Wt 132 lb 1.9 oz (59.9 kg)   SpO2 98%   BMI 21.32 kg/m  .  BMI Body mass index is 21.32 kg/m.  Wt Readings from Last 3 Encounters:  08/13/19 132 lb 1.9 oz (59.9 kg)  07/22/19 148 lb 12.8 oz (67.5 kg)  07/01/19 152 lb (68.9 kg)    General: Alert and in no acute distress. Her weight continues to drop - down 16 more pounds.   HEENT: Normal.  Neck: Supple, no JVD, carotid bruits, or masses noted.  Cardiac: Fairly regular with HR about 100 by my exam. Heart tones are very distant. No edema.  Respiratory:  Very decreased breath sounds - she has oxygen in place at 4L.  GI: Soft and nontender. She did get this stabbing pain while she was here today - very brief.  MS: No deformity or atrophy. Gait not tested.  Skin: Warm and dry. Color is sallow.  Neuro:  Strength and sensation are intact and no gross focal deficits noted.  Psych: Alert, appropriate and with normal affect.   LABORATORY DATA:  EKG:  EKG is not ordered today.   Lab Results  Component Value Date   WBC 13.0 (H) 06/16/2019   HGB 9.1 (L) 06/16/2019   HCT 30.5 (L) 06/16/2019   PLT 269 06/16/2019   GLUCOSE  296 (H) 06/16/2019   CHOL 154 04/23/2019   TRIG 89 04/23/2019   HDL 51 04/23/2019   LDLCALC 85 04/23/2019   ALT 45 (H) 06/08/2019   AST 34 06/08/2019   NA 137 06/16/2019   K 4.7 06/16/2019   CL 93 (L) 06/16/2019   CREATININE 0.73 06/16/2019   BUN 17 06/16/2019   CO2 36 (H) 06/16/2019   TSH 2.564 04/26/2019   INR 1.0 12/02/2013   HGBA1C 6.2 (H) 04/21/2019     BNP (last 3 results) Recent Labs    04/26/19 1824 06/08/19 0247 06/16/19 1321  BNP 110.7* 256.0* 101.4*    ProBNP (last 3 results) Recent Labs    04/15/19 1225  PROBNP 711*     Other Studies Reviewed Today:  Echo 04/19/19  1. Left ventricular ejection fraction, by visual estimation, is 60 to 65%. The  left ventricle has normal function. There is mildly increased left ventricular hypertrophy. 2. Left ventricular diastolic parameters are consistent with Grade I diastolic dysfunction (impaired relaxation). 3. The left ventricle has no regional wall motion abnormalities. 4. Global right ventricle has normal systolic function.The right ventricular size is normal. No increase in right ventricular wall thickness. 5. Left atrial size was normal. 6. Right atrial size was normal. 7. The mitral valve is normal in structure. No evidence of mitral valve regurgitation. 8. The tricuspid valve is grossly normal. 9. The tricuspid valve is grossly normal. Tricuspid valve regurgitation is trivial. 10. The aortic valve is normal in structure. Aortic valve regurgitation is not visualized. 11. The pulmonic valve was normal in structure. Pulmonic valve regurgitation is not visualized. 12. Moderately elevated pulmonary artery systolic pressure. 13. Pulmonary hypertension is moderate. 14. The atrial septum is grossly normal.  CARDIAC CATH: 04/22/2019  2nd Mrg lesion is 20% stenosed.  Previously placed Mid Cx to Dist Cx stent (unknown type) is widely patent.  Prox LAD to Mid LAD lesion is 90% stenosed.  Post intervention, there is a 0% residual stenosis.  A stent was successfully placed.  There is 90% eccentric in-stent restenosis in the distal proximal LAD Cypher stent which was placed in 2005 in a large LAD system.  Patent stents in a dominant left circumflex vessel with 20% intimal hyperplasia in the stent involving the OM1 vessel and patent stent in the AV groove circumflex.  Nondominant normal RCA.  LVEDP 2 mm Hg.  Successful percutaneous coronary intervention to the previously placed 3.0 x 23 mm Cypher stent in the proximal LAD with 90% in-stent restenosis in the distal aspect of the stent with mild narrowing just beyond the stent treated with multiple cuts with a Wolverine cutting  balloon 2.5 x 10 mm, and ultimate stenting with a 3.0 x 18 mm Resolute Onyx stent postdilated to 3.25 mm with the stenosis being reduced to 0%.  RECOMMENDATION: DAPT therapy initially with aspirin/Plavix. If anticoagulation is to be started per primary team due to recent PAF, aspirin can be discontinued after 4 weeks of therapy.  ASSESSMENT AND PLAN:  1.CAD - history of multiple PCIs - last in February - remains on Plavix and Eliqius.   2. Prior AF with RVR - also with prior atrial tach noted - rate is fair. On anticoagulation.   3. Continue weight loss - still high risk for colonoscopy.   4. Anemia - chronic disease - HGB yesterday was 10.9.   5. Chronic respiratory failure/hypoxia - continued smoking - on oxygen.  Overall prognosis tenuous. She is DNI and on palliative care -  not wanting Hospice services at this time.   6. COPD with severe emphysema/chronic dyspnea - on oxygen.   7. HTN - BP still soft - no changes made today.   8. Chronic diastolic HF - weight continues to go down - no swelling. Breathing is felt to be more related to her severe COPD.   9. Carotid disease - given her situation - no further imaging felt to be needed.   10. HLD - on statin.   11. Ongoing tobacco abuse  12. Upper left quadrant abdominal pain - unclear etiology.   58. COVID-19 Education: The signs and symptoms of COVID-19 were discussed with the patient and how to seek care for testing (follow up with PCP or arrange E-visit).  The importance of social distancing, staying at home, hand hygiene and wearing a mask when out in public were discussed today. She has been vaccinated.   Current medicines are reviewed with the patient today.  The patient does not have concerns regarding medicines other than what has been noted above.  The following changes have been made:  See above.  Labs/ tests ordered today include:   No orders of the defined types were placed in this  encounter.    Disposition:   FU with Dr. Johnsie Cancel in August - could consider stopping Plavix 6 months post PCI.    Patient is agreeable to this plan and will call if any problems develop in the interim.   SignedTruitt Merle, NP  08/13/2019 10:38 AM  [facility] [address] [place], [place]  [address] Phone: [phone] Fax: [phone]

## 2019-08-13 ENCOUNTER — Ambulatory Visit (INDEPENDENT_AMBULATORY_CARE_PROVIDER_SITE_OTHER): Payer: Medicare Other | Admitting: Nurse Practitioner

## 2019-08-13 ENCOUNTER — Other Ambulatory Visit: Payer: Self-pay

## 2019-08-13 ENCOUNTER — Encounter: Payer: Self-pay | Admitting: Nurse Practitioner

## 2019-08-13 VITALS — BP 116/58 | HR 102 | Ht 66.0 in | Wt 132.1 lb

## 2019-08-13 DIAGNOSIS — I209 Angina pectoris, unspecified: Secondary | ICD-10-CM | POA: Diagnosis not present

## 2019-08-13 DIAGNOSIS — E785 Hyperlipidemia, unspecified: Secondary | ICD-10-CM

## 2019-08-13 DIAGNOSIS — Z7189 Other specified counseling: Secondary | ICD-10-CM

## 2019-08-13 DIAGNOSIS — I2510 Atherosclerotic heart disease of native coronary artery without angina pectoris: Secondary | ICD-10-CM

## 2019-08-13 DIAGNOSIS — I5032 Chronic diastolic (congestive) heart failure: Secondary | ICD-10-CM

## 2019-08-13 DIAGNOSIS — Z955 Presence of coronary angioplasty implant and graft: Secondary | ICD-10-CM

## 2019-08-13 DIAGNOSIS — I10 Essential (primary) hypertension: Secondary | ICD-10-CM

## 2019-08-13 DIAGNOSIS — I480 Paroxysmal atrial fibrillation: Secondary | ICD-10-CM

## 2019-08-13 DIAGNOSIS — I739 Peripheral vascular disease, unspecified: Secondary | ICD-10-CM | POA: Diagnosis not present

## 2019-08-13 NOTE — Patient Instructions (Addendum)
After Visit Summary:  We will be checking the following labs today - NONE   Medication Instructions:    Continue with your current medicines.    If you need a refill on your cardiac medications before your next appointment, please call your pharmacy.     Testing/Procedures To Be Arranged:  N/A  Follow-Up:   See Dr. Eden Emms in August     At [facility], you and your health needs are our priority.  As part of our continuing mission to provide you with exceptional heart care, we have created designated Provider Care Teams.  These Care Teams include your primary Cardiologist (physician) and Advanced Practice Providers (APPs -  Physician Assistants and Nurse Practitioners) who all work together to provide you with the care you need, when you need it.  Special Instructions:  . Stay safe, stay home, wash your hands for at least 20 seconds and wear a mask when out in public.  . It was good to talk with you both today.    Call the [facility] office at [phone] if you have any questions, problems or concerns.

## 2019-09-04 ENCOUNTER — Emergency Department (HOSPITAL_COMMUNITY): Payer: Medicare Other

## 2019-09-04 ENCOUNTER — Encounter (HOSPITAL_COMMUNITY): Payer: Self-pay | Admitting: Student

## 2019-09-04 ENCOUNTER — Inpatient Hospital Stay (HOSPITAL_COMMUNITY)
Admission: EM | Admit: 2019-09-04 | Discharge: 2019-09-06 | DRG: 190 | Disposition: A | Discharge status: Hospice | Payer: Medicare Other | Attending: Internal Medicine | Admitting: Internal Medicine

## 2019-09-04 DIAGNOSIS — F0390 Unspecified dementia without behavioral disturbance: Secondary | ICD-10-CM | POA: Diagnosis present

## 2019-09-04 DIAGNOSIS — I110 Hypertensive heart disease with heart failure: Secondary | ICD-10-CM | POA: Diagnosis present

## 2019-09-04 DIAGNOSIS — E1151 Type 2 diabetes mellitus with diabetic peripheral angiopathy without gangrene: Secondary | ICD-10-CM | POA: Diagnosis present

## 2019-09-04 DIAGNOSIS — F0281 Dementia in other diseases classified elsewhere with behavioral disturbance: Secondary | ICD-10-CM

## 2019-09-04 DIAGNOSIS — Z825 Family history of asthma and other chronic lower respiratory diseases: Secondary | ICD-10-CM | POA: Diagnosis not present

## 2019-09-04 DIAGNOSIS — I2510 Atherosclerotic heart disease of native coronary artery without angina pectoris: Secondary | ICD-10-CM | POA: Diagnosis present

## 2019-09-04 DIAGNOSIS — Z823 Family history of stroke: Secondary | ICD-10-CM

## 2019-09-04 DIAGNOSIS — Z9981 Dependence on supplemental oxygen: Secondary | ICD-10-CM

## 2019-09-04 DIAGNOSIS — Z66 Do not resuscitate: Secondary | ICD-10-CM | POA: Diagnosis present

## 2019-09-04 DIAGNOSIS — I5032 Chronic diastolic (congestive) heart failure: Secondary | ICD-10-CM | POA: Diagnosis present

## 2019-09-04 DIAGNOSIS — J9621 Acute and chronic respiratory failure with hypoxia: Secondary | ICD-10-CM | POA: Diagnosis present

## 2019-09-04 DIAGNOSIS — Z8249 Family history of ischemic heart disease and other diseases of the circulatory system: Secondary | ICD-10-CM

## 2019-09-04 DIAGNOSIS — D649 Anemia, unspecified: Secondary | ICD-10-CM | POA: Diagnosis present

## 2019-09-04 DIAGNOSIS — J9622 Acute and chronic respiratory failure with hypercapnia: Secondary | ICD-10-CM | POA: Diagnosis present

## 2019-09-04 DIAGNOSIS — Z79899 Other long term (current) drug therapy: Secondary | ICD-10-CM

## 2019-09-04 DIAGNOSIS — Z8673 Personal history of transient ischemic attack (TIA), and cerebral infarction without residual deficits: Secondary | ICD-10-CM

## 2019-09-04 DIAGNOSIS — I10 Essential (primary) hypertension: Secondary | ICD-10-CM | POA: Diagnosis present

## 2019-09-04 DIAGNOSIS — I480 Paroxysmal atrial fibrillation: Secondary | ICD-10-CM | POA: Diagnosis present

## 2019-09-04 DIAGNOSIS — J441 Chronic obstructive pulmonary disease with (acute) exacerbation: Principal | ICD-10-CM | POA: Diagnosis present

## 2019-09-04 DIAGNOSIS — Z993 Dependence on wheelchair: Secondary | ICD-10-CM

## 2019-09-04 DIAGNOSIS — E119 Type 2 diabetes mellitus without complications: Secondary | ICD-10-CM

## 2019-09-04 DIAGNOSIS — Z515 Encounter for palliative care: Secondary | ICD-10-CM | POA: Diagnosis not present

## 2019-09-04 DIAGNOSIS — Z7902 Long term (current) use of antithrombotics/antiplatelets: Secondary | ICD-10-CM

## 2019-09-04 DIAGNOSIS — Z79811 Long term (current) use of aromatase inhibitors: Secondary | ICD-10-CM

## 2019-09-04 DIAGNOSIS — Z853 Personal history of malignant neoplasm of breast: Secondary | ICD-10-CM | POA: Diagnosis not present

## 2019-09-04 DIAGNOSIS — Z9861 Coronary angioplasty status: Secondary | ICD-10-CM

## 2019-09-04 DIAGNOSIS — G20 Parkinson's disease: Secondary | ICD-10-CM | POA: Diagnosis not present

## 2019-09-04 DIAGNOSIS — Z7901 Long term (current) use of anticoagulants: Secondary | ICD-10-CM

## 2019-09-04 DIAGNOSIS — Z87891 Personal history of nicotine dependence: Secondary | ICD-10-CM

## 2019-09-04 DIAGNOSIS — Z20822 Contact with and (suspected) exposure to covid-19: Secondary | ICD-10-CM | POA: Diagnosis present

## 2019-09-04 DIAGNOSIS — Z923 Personal history of irradiation: Secondary | ICD-10-CM

## 2019-09-04 DIAGNOSIS — R5381 Other malaise: Secondary | ICD-10-CM | POA: Diagnosis present

## 2019-09-04 DIAGNOSIS — Z7984 Long term (current) use of oral hypoglycemic drugs: Secondary | ICD-10-CM

## 2019-09-04 DIAGNOSIS — Z7951 Long term (current) use of inhaled steroids: Secondary | ICD-10-CM

## 2019-09-04 LAB — COMPREHENSIVE METABOLIC PANEL
ALT: 20 U/L (ref 0–44)
AST: 24 U/L (ref 15–41)
Albumin: 3.7 g/dL (ref 3.5–5.0)
Alkaline Phosphatase: 60 U/L (ref 38–126)
Anion gap: 11 (ref 5–15)
BUN: 12 mg/dL (ref 8–23)
CO2: 33 mmol/L — ABNORMAL HIGH (ref 22–32)
Calcium: 9.6 mg/dL (ref 8.9–10.3)
Chloride: 96 mmol/L — ABNORMAL LOW (ref 98–111)
Creatinine, Ser: 0.83 mg/dL (ref 0.44–1.00)
GFR calc Af Amer: >60 mL/min (ref >60)
GFR calc non Af Amer: >60 mL/min (ref >60)
Glucose, Bld: 181 mg/dL — ABNORMAL HIGH (ref 70–99)
Potassium: 4.1 mmol/L (ref 3.5–5.1)
Sodium: 140 mmol/L (ref 135–145)
Total Bilirubin: 0.6 mg/dL (ref 0.3–1.2)
Total Protein: 7.3 g/dL (ref 6.5–8.1)

## 2019-09-04 LAB — I-STAT ARTERIAL BLOOD GAS, ED
Acid-Base Excess: 9 mmol/L — ABNORMAL HIGH (ref 0.0–2.0)
Bicarbonate: 38.8 mmol/L — ABNORMAL HIGH (ref 20.0–28.0)
Calcium, Ion: 1.27 mmol/L (ref 1.15–1.40)
HCT: 33 % — ABNORMAL LOW (ref 36.0–46.0)
Hemoglobin: 11.2 g/dL — ABNORMAL LOW (ref 12.0–15.0)
O2 Saturation: 94 %
Patient temperature: 97.9
Potassium: 4.7 mmol/L (ref 3.5–5.1)
Sodium: 140 mmol/L (ref 135–145)
TCO2: 41 mmol/L — ABNORMAL HIGH (ref 22–32)
pCO2 arterial: 80.2 mmHg (ref 32.0–48.0)
pH, Arterial: 7.29 — ABNORMAL LOW (ref 7.350–7.450)
pO2, Arterial: 80 mmHg — ABNORMAL LOW (ref 83.0–108.0)

## 2019-09-04 LAB — CBC
HCT: 39.2 % (ref 36.0–46.0)
Hemoglobin: 11.6 g/dL — ABNORMAL LOW (ref 12.0–15.0)
MCH: 27.1 pg (ref 26.0–34.0)
MCHC: 29.6 g/dL — ABNORMAL LOW (ref 30.0–36.0)
MCV: 91.6 fL (ref 80.0–100.0)
Platelets: 204 10*3/uL (ref 150–400)
RBC: 4.28 MIL/uL (ref 3.87–5.11)
RDW: 14.6 % (ref 11.5–15.5)
WBC: 8.9 10*3/uL (ref 4.0–10.5)
nRBC: 0 % (ref 0.0–0.2)

## 2019-09-04 LAB — SARS CORONAVIRUS 2 BY RT PCR (HOSPITAL ORDER, PERFORMED IN ~~LOC~~ HOSPITAL LAB): SARS Coronavirus 2: NEGATIVE

## 2019-09-04 LAB — TROPONIN I (HIGH SENSITIVITY)
Troponin I (High Sensitivity): 10 ng/L (ref <18)
Troponin I (High Sensitivity): 7 ng/L (ref <18)

## 2019-09-04 LAB — GLUCOSE, CAPILLARY: Glucose-Capillary: 158 mg/dL — ABNORMAL HIGH (ref 70–99)

## 2019-09-04 LAB — BRAIN NATRIURETIC PEPTIDE: B Natriuretic Peptide: 41.2 pg/mL (ref 0.0–100.0)

## 2019-09-04 LAB — MAGNESIUM: Magnesium: 2.7 mg/dL — ABNORMAL HIGH (ref 1.7–2.4)

## 2019-09-04 MED ORDER — ACETAMINOPHEN 650 MG RE SUPP: 650.0000 mg | Freq: Four times a day (QID) | RECTAL | Status: DC | PRN

## 2019-09-04 MED ORDER — LORAZEPAM 1 MG PO TABS
1.0000 mg | ORAL_TABLET | Freq: Once | ORAL | Status: AC
  Administered 2019-09-04: 1 mg via ORAL
  Filled 2019-09-04: qty 1

## 2019-09-04 MED ORDER — SODIUM CHLORIDE 0.9% FLUSH
3.0000 mL | Freq: Two times a day (BID) | INTRAVENOUS | Status: DC
  Administered 2019-09-04 – 2019-09-06 (×4): 3 mL via INTRAVENOUS

## 2019-09-04 MED ORDER — SODIUM CHLORIDE 0.9 % IV SOLN
500.0000 mg | INTRAVENOUS | Status: DC
  Administered 2019-09-04 – 2019-09-05 (×2): 500 mg via INTRAVENOUS
  Filled 2019-09-04 (×3): qty 500

## 2019-09-04 MED ORDER — LABETALOL HCL 5 MG/ML IV SOLN: 10.0000 mg | INTRAVENOUS | Status: DC | PRN

## 2019-09-04 MED ORDER — ENOXAPARIN SODIUM 40 MG/0.4ML ~~LOC~~ SOLN: 40.0000 mg | SUBCUTANEOUS | Status: DC

## 2019-09-04 MED ORDER — IPRATROPIUM-ALBUTEROL 0.5-2.5 (3) MG/3ML IN SOLN
3.0000 mL | Freq: Four times a day (QID) | RESPIRATORY_TRACT | Status: DC
  Administered 2019-09-04 – 2019-09-05 (×4): 3 mL via RESPIRATORY_TRACT
  Filled 2019-09-04 (×4): qty 3

## 2019-09-04 MED ORDER — ONDANSETRON HCL 4 MG/2ML IJ SOLN
4.0000 mg | Freq: Four times a day (QID) | INTRAMUSCULAR | Status: DC | PRN
  Administered 2019-09-05: 4 mg via INTRAVENOUS
  Filled 2019-09-04: qty 2

## 2019-09-04 MED ORDER — METHYLPREDNISOLONE SODIUM SUCC 125 MG IJ SOLR
60.0000 mg | Freq: Three times a day (TID) | INTRAMUSCULAR | Status: DC
  Administered 2019-09-04 – 2019-09-05 (×2): 60 mg via INTRAVENOUS
  Filled 2019-09-04 (×2): qty 2

## 2019-09-04 MED ORDER — ENOXAPARIN SODIUM 60 MG/0.6ML ~~LOC~~ SOLN
60.0000 mg | Freq: Two times a day (BID) | SUBCUTANEOUS | Status: DC
  Administered 2019-09-04: 60 mg via SUBCUTANEOUS
  Filled 2019-09-04 (×3): qty 0.6

## 2019-09-04 MED ORDER — ACETAMINOPHEN 325 MG PO TABS
650.0000 mg | ORAL_TABLET | Freq: Four times a day (QID) | ORAL | Status: DC | PRN
  Administered 2019-09-05: 650 mg via ORAL
  Filled 2019-09-04 (×2): qty 2

## 2019-09-04 MED ORDER — ONDANSETRON HCL 4 MG PO TABS: 4.0000 mg | ORAL_TABLET | Freq: Four times a day (QID) | ORAL | Status: DC | PRN

## 2019-09-04 MED ORDER — SODIUM CHLORIDE 0.9 % IV BOLUS
500.0000 mL | Freq: Once | INTRAVENOUS | Status: AC
  Administered 2019-09-04: 500 mL via INTRAVENOUS

## 2019-09-04 MED ORDER — IPRATROPIUM-ALBUTEROL 0.5-2.5 (3) MG/3ML IN SOLN
3.0000 mL | Freq: Once | RESPIRATORY_TRACT | Status: AC
  Administered 2019-09-05: 3 mL via RESPIRATORY_TRACT
  Filled 2019-09-04: qty 3

## 2019-09-04 NOTE — ED Provider Notes (Signed)
[facility] EMERGENCY DEPARTMENT Provider Note   CSN: [id number] Arrival date & time: 09/04/19  3329     History Chief Complaint  Patient presents with  . Shortness of Breath    Patricia Wall is a 69 y.o. female with a history of chronic respiratory failure with hypercapnia who is on 4 L via nasal cannula at baseline, tobacco abuse, COPD, CAD status post PCI, diastolic heart failure, hypertension, and T2DM who presents to the emergency department via EMS for respiratory distress that began today.  Level 5 caveat applies secondary to respiratory distress.  History is primarily provided by EMS who relays that the patient started having shortness of breath yesterday that progressively worsened and seemed to significantly worsened this morning.  They administered 125 mg of solumedrol, 2 mg of magnesium, 10 of albuterol, and 1 of Atrovent in route.  On chart review patient's most recent COPD exacerbation was 05/2019, did not require admission at that time.  HPI     Past Medical History:  Diagnosis Date  . Abnormal chest xray   . ABUSE, ALCOHOL, UNSPECIFIED 09/04/2006   Qualifier: Diagnosis of  By: Amil Amen MD, Benjamine Mola    . Acute diastolic CHF (congestive heart failure) ([place])   . Acute on chronic respiratory failure with hypercapnia ([place])   . Acute respiratory failure with hypoxia ([place]) 01/07/2015  . Alcohol abuse   . Angina, class III ([place]) 03/29/2007   Qualifier: Diagnosis of  By: Amil Amen MD, Benjamine Mola    . ARF (acute renal failure) ([place]) 04/27/2019  . Back pain   . CAD (coronary artery disease)     Prev seen by [facility].  Stent to OM and LAD Last cath 6/11 cathed on June 13, [name]. 2011.  The cardiac catheterization showed 30% and 50% lesions in the mid  and distal LAD, 50% in the OM1, 40% in the OM2 with 20% in-stent  restenosis, 40% in-stent restenosis in the circumflex and no significant  disease in the RCA.  Her EF was normal.  Dr. Olevia Perches recommended a trial  of  proton pump inhibitors   . CAD S/P percutaneous coronary angioplasty: Prior Cypher DES to pLAD, pOM2 & AVG Cx (after Om2); New DES to AVG Cx ISR & Angiosculpt PTCA of Ostial AVG Cx from OM2. 09/04/2006   Qualifier: Diagnosis of  By: Amil Amen MD, Benjamine Mola    . Chest pain   . COPD (chronic obstructive pulmonary disease) ([place])   . COPD GOLD II / still smoking  09/04/2006   Followed in Pulmonary clinic/ [facility]/ [place]  Active smoker  - pft's 12/12/12 FEV1  1.41 (53%) ratio 54 with no significant reversibility and DLCO 47% corrects to 50 01/16/2013  Walked RA x 2 laps @ 185 ft each stopped due to sob, no desat     - Trial of anoro 01/16/2013 > helped but insurance would not pay - incruse one daily started 10/02/2013 >>>not covered  -Spiriva restarted 10/15/13 >  . Cyanosis   . DDD (degenerative disc disease)   . DEGENERATIVE DISC DISEASE, CERVICAL SPINE 09/04/2006   Annotation: c6-7 on the right Qualifier: Diagnosis of  By: Amil Amen MD, Benjamine Mola    . Depression   . Diarrhea   . Diverticular disease   . DIVERTICULOSIS, COLON 09/04/2006   Qualifier: Diagnosis of  By: Amil Amen MD, Benjamine Mola    . GERD (gastroesophageal reflux disease)   . H/O: hysterectomy   . Hemorrhoids   . HTN (hypertension)   . IBS (irritable bowel  syndrome)   . Insomnia   . Irritable bowel syndrome 09/04/2006   Qualifier: Diagnosis of  By: Amil Amen MD, Benjamine Mola    . Lymphadenitis   . PEPTIC ULCER DISEASE 09/04/2006   Annotation: distant history of  Qualifier: Diagnosis of  By: Amil Amen MD, Benjamine Mola    . Pharyngitis   . PHARYNGITIS 07/18/2007   Qualifier: Diagnosis of  By: Radene Ou MD, [place]    . PUD (peptic ulcer disease)   . PVD (peripheral vascular disease) ([place])   . Sinusitis   . TIA (transient ischemic attack)   . Tobacco abuse   . Vertigo     Patient Active Problem List   Diagnosis Date Noted  . ARF (acute renal failure) ([place]) 04/27/2019  . Hypotension 04/27/2019  . Diabetes mellitus type 2 in  nonobese ([place]) 04/27/2019  . Pressure injury of skin 04/23/2019  . Acute on chronic respiratory failure with hypercapnia ([place])   . SOB (shortness of breath)   . Acute diastolic CHF (congestive heart failure) ([place])   . Exertional angina (H[facility])   . Atrial fibrillation with RVR ([place])   . Anemia 04/18/2019  . Thrombocytopenia ([place]) 04/18/2019  . AF (paroxysmal atrial fibrillation) ([place]) 04/18/2019  . Chronic respiratory failure with hypoxia and hypercapnia ([place]) 03/29/2018  . Acute respiratory failure with hypoxia ([place]) 01/07/2015  . CAD (coronary artery disease) 01/07/2015  . Obesity 11/01/2014  . COPD exacerbation ([place]) 12/30/2013  . Chest pain 12/03/2013  . Atherosclerotic heart disease of native coronary artery with angina pectoris ([place]) 12/03/2013    Class: Diagnosis of  . Syncope 12/02/2013  . Dyspnea 10/02/2013  . Acute upper respiratory infections of unspecified site 04/03/2013  . Right carotid bruit 11/15/2012  . Peripheral vascular disease ([place]) 09/30/2009  . Nonspecific (abnormal) findings on radiological and other examination of body structure 09/02/2009  . ABNORMAL CHEST XRAY 09/02/2009  . TINNITUS 04/15/2008  . COUGH 04/15/2008  . VERTIGO 02/04/2008  . PHARYNGITIS 07/18/2007  . LYMPHADENITIS, CERVICAL 07/18/2007  . Angina, class III ([place]) 03/29/2007  . CONDYLOMA ACUMINATUM 03/01/2007  . SINUSITIS, ACUTE 03/01/2007  . INSOMNIA 01/30/2007  . Elevated lipids 09/04/2006  . ABUSE, ALCOHOL, UNSPECIFIED 09/04/2006  . Cigarette smoker 09/04/2006  . DEPRESSION 09/04/2006  . Essential hypertension 09/04/2006  . CAD S/P percutaneous coronary angioplasty: Prior Cypher DES to pLAD, pOM2 & AVG Cx (after Om2); New DES to AVG Cx ISR & Angiosculpt PTCA of Ostial AVG Cx from OM2. 09/04/2006  . HEMORRHOIDS 09/04/2006  . COPD GOLD II / still smoking  09/04/2006  . GERD 09/04/2006  . PEPTIC ULCER DISEASE 09/04/2006  . DIVERTICULOSIS, COLON 09/04/2006  . Irritable bowel syndrome  09/04/2006  . DEGENERATIVE DISC DISEASE, CERVICAL SPINE 09/04/2006  . LOW BACK PAIN 09/04/2006  . HEADACHE 09/04/2006  . History of cardiovascular disorder 09/04/2006  . ONYCHOMYCOSIS, TOENAILS 09/01/2006  . DIARRHEA 02/18/2006    Past Surgical History:  Procedure Laterality Date  . ABDOMINAL HYSTERECTOMY    . APPENDECTOMY    . BREAST LUMPECTOMY Left 2015  . CARDIAC CATHETERIZATION  2005, 2006, 2009,2011  . CHOLECYSTECTOMY    . CORONARY STENT INTERVENTION N/A 04/22/2019   Procedure: CORONARY STENT INTERVENTION;  Surgeon: Troy Sine, MD;  Location: [facility];  Service: Cardiovascular;  Laterality: N/A;  . CORONARY STENT PLACEMENT     Status post balloon angiogram plasty and Cypher stent to the distal    circumflex and OM2 branch  . ESOPHAGOGASTRODUODENOSCOPY (EGD) WITH PROPOFOL Left 04/21/2019   Procedure:  ESOPHAGOGASTRODUODENOSCOPY (EGD) WITH PROPOFOL;  Surgeon: Arta Silence, MD;  Location: [facility];  Service: Endoscopy;  Laterality: Left;  . Hammertoe repair    . Left Bunionectomy    . LEFT HEART CATH AND CORONARY ANGIOGRAPHY N/A 04/22/2019   Procedure: LEFT HEART CATH AND CORONARY ANGIOGRAPHY;  Surgeon: Troy Sine, MD;  Location: [facility];  Service: Cardiovascular;  Laterality: N/A;  . LEFT HEART CATHETERIZATION WITH CORONARY ANGIOGRAM N/A 12/03/2013   Procedure: LEFT HEART CATHETERIZATION WITH CORONARY ANGIOGRAM;  Surgeon: Leonie Man, MD;  Location: [facility] C[facility] LAB;  Service: Cardiovascular;  Laterality: N/A;  . TUBAL LIGATION       OB History   No obstetric history on file.     Family History  Problem Relation Age of Onset  . Heart disease Mother   . Rheumatologic disease Mother   . Cancer - Other Mother        Uterine  . Emphysema Father   . Heart disease Father   . Cancer - Other Father   . Coronary artery disease Other   . Asthma Sister        2 sisters  . Stroke Sister     Social History   Tobacco Use  . Smoking status:  Former Smoker    Packs/day: 0.25    Years: 59.00    Pack years: 14.75    Types: Cigarettes  . Smokeless tobacco: Never Used  Substance Use Topics  . Alcohol use: No    Alcohol/week: 0.0 standard drinks  . Drug use: Yes    Comment: Seldom Marijuana use- 1Xmonth    Home Medications Prior to Admission medications   Medication Sig Start Date End Date Taking? Authorizing Provider  acetaminophen (TYLENOL) 325 MG tablet Take 2 tablets (650 mg total) by mouth every 4 (four) hours as needed for headache or mild pain. 04/23/19   Raiford Noble Latif, DO  acidophilus (RISAQUAD) CAPS capsule Take 1 capsule by mouth 2 (two) times daily. 04/09/19   [provider]  albuterol (PROVENTIL) (2.5 MG/3ML) 0.083% nebulizer solution USE 1 VIAL IN NEBULIZER EVERY 4 HOURS FOR WHEEZING OR SHORTNESS OF BREATH. 04/29/19   Tanda Rockers, MD  albuterol (VENTOLIN HFA) 108 (90 Base) MCG/ACT inhaler Inhale 2 puffs into the lungs every 6 (six) hours as needed for wheezing or shortness of breath. 07/22/19   Martyn Ehrich, NP  apixaban (ELIQUIS) 5 MG TABS tablet Take 1 tablet (5 mg total) by mouth 2 (two) times daily. 05/30/19   Josue Hector, MD  atorvastatin (LIPITOR) 80 MG tablet Take 80 mg by mouth daily.    [provider]  blood glucose meter kit and supplies Dispense based on patient and insurance preference. Use up to four times daily as directed. (FOR ICD-9 250.00, 250.01). 01/13/15   Reyne Dumas, MD  budesonide-formoterol (SYMBICORT) 160-4.5 MCG/ACT inhaler Inhale 2 puffs into the lungs 2 (two) times daily. 10/26/16   Parrett, Fonnie Mu, NP  calcium carbonate (OS-CAL) 600 MG TABS Take 600 mg by mouth every morning.     [provider]  Cholecalciferol (VITAMIN D) 2000 UNITS tablet Take 2,000 Units by mouth every morning.     [provider]  clopidogrel (PLAVIX) 75 MG tablet Take 1 tablet (75 mg total) by mouth daily with breakfast. 05/30/19   Burtis Junes, NP    Dextromethorphan-Guaifenesin (MUCINEX FAST-MAX DM MAX) 5-100 MG/5ML LIQD Take 5 mLs by mouth every 4 (four) hours as needed (cough/ congestiom).  1 tsp every 4 hours as needed for cough/congestion     [provider]  diltiazem (CARDIZEM CD) 120 MG 24 hr capsule Take 120 mg by mouth daily.  04/05/19   [provider]  donepezil (ARICEPT) 5 MG tablet Take 5 mg by mouth daily.  01/10/18   [provider]  DULoxetine (CYMBALTA) 60 MG capsule Take 60 mg by mouth every morning.     [provider]  gabapentin (NEURONTIN) 300 MG capsule Take 300 mg by mouth 3 (three) times daily.     [provider]  Lancets (FREESTYLE) lancets Use as instructed 01/13/15   Reyne Dumas, MD  letrozole [facility] 2) 2.5 MG tablet Take 2.5 mg by mouth every morning.     [provider]  methocarbamol (ROBAXIN) 750 MG tablet Take 750 mg by mouth every 8 (eight) hours as needed for muscle spasms.    [provider]  metoprolol succinate (TOPROL-XL) 25 MG 24 hr tablet Take 25 mg by mouth daily.  04/05/19   [provider]  morphine 20 MG/5ML solution Taking 0.34m every 2 hours as needed 07/10/19   [provider]  Multiple Vitamin (MULTIVITAMIN) tablet Take 1 tablet by mouth daily.     [provider]  nitroGLYCERIN (NITROSTAT) 0.4 MG SL tablet Place 1 tablet (0.4 mg total) under the tongue every 5 (five) minutes as needed for chest pain (may repeat x3). 07/22/19   WMartyn Ehrich NP  OXYGEN Use 4L 24/7    [provider]  pantoprazole (PROTONIX) 40 MG tablet Take 40 mg by mouth daily. 05/05/19   [provider]  potassium chloride SA (KLOR-CON M20) 20 MEQ tablet Take 1 tablet (20 mEq total) by mouth daily. 07/01/19   NJosue Hector MD  Respiratory Therapy Supplies (FLUTTER) DEVI Use as directed 07/13/17   WTanda Rockers MD  Tiotropium Bromide Monohydrate (SPIRIVA RESPIMAT) 2.5 MCG/ACT AERS Inhale 2 puffs into the lungs  daily. 10/26/16   Parrett, TFonnie Mu NP    Allergies    Patient has no known allergies.  Review of Systems   Review of Systems  Unable to perform ROS: Severe respiratory distress    Physical Exam Updated Vital Signs BP 94/77 (BP Location: Right Arm)   Pulse (!) 115   Temp 97.9 F (36.6 C) (Oral)   Resp (!) 35   SpO2 100%   Physical Exam Vitals and nursing note reviewed.  Constitutional:      General: She is in acute distress.  HENT:     Head: Normocephalic and atraumatic.  Eyes:     Pupils: Pupils are equal, round, and reactive to light.  Cardiovascular:     Rate and Rhythm: Regular rhythm. Tachycardia present.  Pulmonary:     Effort: Tachypnea present.     Comments: SpO2 >95% on 6L via [place], transitioned from NRB that was placed by EMS. Patient very tachypneic, poor air movement with faint wheezing throughout all lung fields.  Abdominal:     Palpations: Abdomen is soft.     Tenderness: There is no abdominal tenderness.  Musculoskeletal:     Cervical back: Neck supple.     Right lower leg: No tenderness. No edema.     Left lower leg: No tenderness. No edema.  Skin:    General: Skin is dry.  Neurological:     Mental Status: She is alert.  Psychiatric:        Mood and Affect: Mood is anxious.  ED Results / Procedures / Treatments   Labs (all labs ordered are listed, but only abnormal results are displayed) Labs Reviewed  CBC - Abnormal; Notable for the following components:      Result Value   Hemoglobin 11.6 (*)    MCHC 29.6 (*)    All other components within normal limits  COMPREHENSIVE METABOLIC PANEL - Abnormal; Notable for the following components:   Chloride 96 (*)    CO2 33 (*)    Glucose, Bld 181 (*)    All other components within normal limits  MAGNESIUM - Abnormal; Notable for the following components:   Magnesium 2.7 (*)    All other components within normal limits  SARS CORONAVIRUS 2 BY RT PCR (HOSPITAL ORDER, [facility])  BRAIN NATRIURETIC PEPTIDE  I-STAT ARTERIAL BLOOD GAS, ED  TROPONIN I (HIGH SENSITIVITY)  TROPONIN I (HIGH SENSITIVITY)    EKG None  Radiology DG Chest Portable 1 View  Result Date: 09/04/2019 CLINICAL DATA:  dyspnea EXAM: PORTABLE CHEST 1 VIEW COMPARISON:  Chest radiograph 06/16/2019 FINDINGS: The heart size and mediastinal contours are within normal limits. Lungs are hyperinflated. Chronic bilateral coarse interstitial markings. No new focal infiltrate. No pneumothorax or significant pleural effusion. The visualized skeletal structures are unremarkable. IMPRESSION: No acute cardiopulmonary finding. Electronically Signed   By: Audie Pinto M.D.   On: 09/04/2019 09:58    Procedures .Critical Care Performed by: Amaryllis Dyke, PA-C Authorized by: Amaryllis Dyke, PA-C     CRITICAL CARE Performed by: Kennith Maes   Total critical care time: 45 minutes  Critical care time was exclusive of separately billable procedures and treating other patients.  Critical care was necessary to treat or prevent imminent or life-threatening deterioration.  Critical care was time spent personally by me on the following activities: development of treatment plan with patient and/or surrogate as well as nursing, discussions with consultants, evaluation of patient's response to treatment, examination of patient, obtaining history from patient or surrogate, ordering and performing treatments and interventions, ordering and review of laboratory studies, ordering and review of radiographic studies, pulse oximetry and re-evaluation of patient's condition.   (including critical care time)  Medications Ordered in ED Medications  ipratropium-albuterol (DUONEB) 0.5-2.5 (3) MG/3ML nebulizer solution 3 mL (has no administration in time range)  sodium chloride 0.9 % bolus 500 mL (has no administration in time range)  LORazepam (ATIVAN) tablet 1 mg (1 mg Oral Given 09/04/19 0953)     ED Course  I have reviewed the triage vital signs and the nursing notes.  Pertinent labs & imaging results that were available during my care of the patient were reviewed by me and considered in my medical decision making (see chart for details).    FATOU Wall was evaluated in Emergency Department on 09/04/2019 for the symptoms described in the history of present illness. He/she was evaluated in the context of the global COVID-19 pandemic, which necessitated consideration that the patient might be at risk for infection with the SARS-CoV-2 virus that causes COVID-19. Institutional protocols and algorithms that pertain to the evaluation of patients at risk for COVID-19 are in a state of rapid change based on information released by regulatory bodies including the CDC and federal and state organizations. These policies and algorithms were followed during the patient's care in the ED.  MDM Rules/Calculators/A&P  Patient presents to the emergency department via EMS for respiratory distress. She is on 4L [place] at baseline, currently on 6L via [place] with increased work of breathing, tachypnea, poor air movement, expiratory wheezes noted faintly.   Additional history obtained:  Additional history obtained from EMS-received steroids, mag, albuterol, and Atrovent in route, did have one episode of emesis which led to administration of Zofran without further episodes of vomiting.. Previous records obtained and reviewed most recent COPD exacerbation 05/2019 which did not require admission.  Lab Tests:  I Ordered, reviewed, and interpreted labs, which included:  CBC: Mild anemia improved from prior.  CMP: Hyperglycemia, elevated bicarb. No significant electrolyte derangement.  Mag: mildly elevated Troponin: 10- no significant elevation BNP: WNL  Imaging Studies ordered:  I ordered imaging studies which included CXR, I independently visualized and interpreted imaging which showed  no acute process  ED Course:  On initial evaluation patient's SPO2 is maintaining on 6 L via nasal cannula, however she is tachypneic with poor air movement and increased work of breathing, discussed with respiratory therapist regarding placing patient on BiPAP as I feel she would benefit from this, she was moved into a negative pressure room, however RT felt patient was improving some therefore held off.  09:51: Per RN patient with increased anxiety status post albuterol, will give oral Ativan.  10:30: RE-EVAL: Patient with increasing sleepiness, responsive to verbal stimuli, answers yes to most questions, seems a bit confused.  ABG ordered, re- discussed with respiratory therapy regarding application of BiPAP.   ABG with undetectably high CO2.  11:45: Patient on BiPAP.   Chest x-ray without signs of pulmonary edema, BNP within normal limits, does not seem consistent with CHF exacerbation.  Troponin is not elevated, EKG without significant ischemic changes, low suspicion for ACS.  Given her adventitious lung sounds feel that pulmonary embolism is less likely.  Overall seems consistent with COPD exacerbation w/ acute on chronic hypercapnic respiratory failure.    Patient continuing on BiPAP.  Dr. Roslynn Amble has discussed with patient's daughter who is 1 of 3 children, she is requesting patient be DNR/DNI, okay with continuation of BiPAP, she has discussed this with her siblings who were all in agreement.  We will discuss with hospitalist service for admission. 13:25: CONSULT: Discussed with hospitalist Dr. Tamala Julian- accepts admission.   This is a shared visit with supervising physician Dr. Roslynn Amble who has independently evaluated patient & provided guidance in evaluation/management/disposition, in agreement with care   Portions of this note were generated with Dragon dictation software. Dictation errors may occur despite best attempts at proofreading.  Final Clinical Impression(s) / ED  Diagnoses Final diagnoses:  COPD exacerbation ([place])  Acute on chronic respiratory failure with hypercapnia [facility])    Rx / DC Orders ED Discharge Orders    None       Amaryllis Dyke, PA-C 09/04/19 1331    Lucrezia Starch, MD 09/05/19 1017

## 2019-09-04 NOTE — Progress Notes (Signed)
Patient placed back on BIPAP. Per family, she wears it at night.

## 2019-09-04 NOTE — ED Notes (Signed)
Attempted to call report X 1. 

## 2019-09-04 NOTE — ED Triage Notes (Addendum)
-  PT presents from home by EMS with resp. Distress on NRB -shob that started yesterday -hx: COPD, Emphysema, current smoker -EMS administered 10 albuterol, 1 atrovent; 125 solu; 2 Mag; -vomitted once; received  4 Zofran  On arrival, patient is alert, 98% O2. Resp therapist applied 6 lit [place]  Will continue to monitor   Denies chest pain  EDP at bedside

## 2019-09-04 NOTE — Progress Notes (Signed)
ANTICOAGULATION CONSULT NOTE - Initial Consult  Pharmacy Consult for enoxaparin Indication: atrial fibrillation  No Known Allergies  Patient Measurements: Weight: 60 kg on 08/13/2019  Vital Signs: Temp: 97.9 F (36.6 C) (06/16 0936) Temp Source: Oral (06/16 0936) BP: 121/57 (06/16 1302) Pulse Rate: 113 (06/16 1317)  Labs: Recent Labs    09/04/19 0935 09/04/19 1238  HGB 11.6*  --   HCT 39.2  --   PLT 204  --   CREATININE 0.83  --   TROPONINIHS 10 7    CrCl cannot be calculated (Unknown ideal weight.).   Medical History: Past Medical History:  Diagnosis Date  . Abnormal chest xray   . ABUSE, ALCOHOL, UNSPECIFIED 09/04/2006   Qualifier: Diagnosis of  By: Amil Amen MD, Benjamine Mola    . Acute diastolic CHF (congestive heart failure) ([place])   . Acute on chronic respiratory failure with hypercapnia ([place])   . Acute respiratory failure with hypoxia ([place]) 01/07/2015  . Alcohol abuse   . Angina, class III ([place]) 03/29/2007   Qualifier: Diagnosis of  By: Amil Amen MD, Benjamine Mola    . ARF (acute renal failure) ([place]) 04/27/2019  . Back pain   . CAD (coronary artery disease)     Prev seen by [facility].  Stent to OM and LAD Last cath 6/11 cathed on June 13, [name]. 2011.  The cardiac catheterization showed 30% and 50% lesions in the mid  and distal LAD, 50% in the OM1, 40% in the OM2 with 20% in-stent  restenosis, 40% in-stent restenosis in the circumflex and no significant  disease in the RCA.  Her EF was normal.  Dr. Olevia Perches recommended a trial  of proton pump inhibitors   . CAD S/P percutaneous coronary angioplasty: Prior Cypher DES to pLAD, pOM2 & AVG Cx (after Om2); New DES to AVG Cx ISR & Angiosculpt PTCA of Ostial AVG Cx from OM2. 09/04/2006   Qualifier: Diagnosis of  By: Amil Amen MD, Benjamine Mola    . Chest pain   . COPD (chronic obstructive pulmonary disease) ([place])   . COPD GOLD II / still smoking  09/04/2006   Followed in Pulmonary clinic/ [facility]/ [place]  Active smoker  - pft's  12/12/12 FEV1  1.41 (53%) ratio 54 with no significant reversibility and DLCO 47% corrects to 50 01/16/2013  Walked RA x 2 laps @ 185 ft each stopped due to sob, no desat     - Trial of anoro 01/16/2013 > helped but insurance would not pay - incruse one daily started 10/02/2013 >>>not covered  -Spiriva restarted 10/15/13 >  . Cyanosis   . DDD (degenerative disc disease)   . DEGENERATIVE DISC DISEASE, CERVICAL SPINE 09/04/2006   Annotation: c6-7 on the right Qualifier: Diagnosis of  By: Amil Amen MD, Benjamine Mola    . Depression   . Diarrhea   . Diverticular disease   . DIVERTICULOSIS, COLON 09/04/2006   Qualifier: Diagnosis of  By: Amil Amen MD, Benjamine Mola    . GERD (gastroesophageal reflux disease)   . H/O: hysterectomy   . Hemorrhoids   . HTN (hypertension)   . IBS (irritable bowel syndrome)   . Insomnia   . Irritable bowel syndrome 09/04/2006   Qualifier: Diagnosis of  By: Amil Amen MD, Benjamine Mola    . Lymphadenitis   . PEPTIC ULCER DISEASE 09/04/2006   Annotation: distant history of  Qualifier: Diagnosis of  By: Amil Amen MD, Benjamine Mola    . Pharyngitis   . PHARYNGITIS 07/18/2007   Qualifier: Diagnosis of  By: Radene Ou MD, [place]    .  PUD (peptic ulcer disease)   . PVD (peripheral vascular disease) ([facility])   . Sinusitis   . TIA (transient ischemic attack)   . Tobacco abuse   . Vertigo     Medications:  Scheduled:  . enoxaparin (LOVENOX) injection  40 mg Subcutaneous Q24H  . ipratropium-albuterol  3 mL Nebulization Once  . ipratropium-albuterol  3 mL Nebulization QID  . methylPREDNISolone (SOLU-MEDROL) injection  60 mg Intravenous Q8H  . sodium chloride flush  3 mL Intravenous Q12H    Assessment: 69 yof on apixaban PTA for atrial fibrillation, now unable to take PO so pharmacy to dose therapeutic enoxaparin. Last dose of apixaban per med history was 6/15, unknown if in am or pm. Creatinine seems at baseline at 0.83. Baseline H&H 11.6/39.2, platelets 204. Last patient weight 60 kg on  08/13/2019, unable to confirm more current weight with patient at this time.   Goal of Therapy:  Monitor platelets by anticoagulation protocol: Yes   Plan:  Lovenox 60 mg [place] every 12 hours  Monitor renal function, CBC, bleeding  Follow up dosing with more accurate weight once obtained Follow up resuming PO anticoagulation when able     Thank you,   Fara Olden, PharmD PGY-1 Pharmacy Resident   Please check amion for clinical pharmacist contact number 09/04/2019,2:53 PM

## 2019-09-04 NOTE — H&P (Signed)
History and Physical    Patricia Wall VFM:[id number] DOB: Apr 26, 1950 DOA: 09/04/2019  Referring MD/NP/PA: Kennith Maes, PA-C PCP: Bernerd Limbo, MD  Patient coming from: home via EMS  Chief Complaint: Shortness of breath  I have personally briefly reviewed patient's old medical records in [place]   HPI: Patricia Wall is a 69 y.o. female with medical history significant of COPD, chronic respiratory failure with hypoxia on 4 L nasal cannula oxygen, hypertension, CAD, diastolic CHF, breast cancer s/p lumpectomy and radiation treatment, and diabetes mellitus type 2 presents with complaints of progressively worsening shortness of breath over the last 2 days.  History is obtained from the patient's daughter Patricia Wall who lives with the patient and is her primary caretaker.  The patient had only been coughing a little bit in the mornings, but was having progressively worsening shortness of breath.  Reported to be unable to take in any air.  Her daughter had been having her on BiPAP for much of the day and night.  It had appeared that the patient had been maintaining her oxygenation on her pulse oximetry.  Patient's last In route with EMS patient was given 25 mg of Solu-Medrol IV, 2 g of magnesium sulfate, Zofran, Atrovent, albuterol.  She was placed on nonrebreather.    ED Course: Upon admission into the emergency department patient was noted afebrile, pulse 67-1 23, respiration 15-41, blood pressures maintained, O2 saturation appear to be maintained on 6 L nasal cannula oxygen.  Labs significant for hemoglobin 11.6, CO2 33, glucose 181, BNP 41.2, and troponin negative x2.  ABG was noted to have a pH of 7.17 and PCO2 was still elevated it was unable to calculated.  Patient was placed on BiPAP after COVID-19 screening was negative.  Chest x-ray showed no acute abnormality.  Patient DuoNeb, 500 mL of normal saline fluids, and Ativan IV.  CODE STATUS was confirmed with both daughters as DO  NOT INTUBATE and DO NOT RESUSCITATE.  Review of Systems  Unable to perform ROS: Severe respiratory distress    Past Medical History:  Diagnosis Date  . Abnormal chest xray   . ABUSE, ALCOHOL, UNSPECIFIED 09/04/2006   Qualifier: Diagnosis of  By: Amil Amen MD, Benjamine Mola    . Acute diastolic CHF (congestive heart failure) ([place])   . Acute on chronic respiratory failure with hypercapnia ([place])   . Acute respiratory failure with hypoxia ([place]) 01/07/2015  . Alcohol abuse   . Angina, class III ([place]) 03/29/2007   Qualifier: Diagnosis of  By: Amil Amen MD, Benjamine Mola    . ARF (acute renal failure) ([place]) 04/27/2019  . Back pain   . CAD (coronary artery disease)     Prev seen by [facility].  Stent to OM and LAD Last cath 6/11 cathed on June 13, [name]. 2011.  The cardiac catheterization showed 30% and 50% lesions in the mid  and distal LAD, 50% in the OM1, 40% in the OM2 with 20% in-stent  restenosis, 40% in-stent restenosis in the circumflex and no significant  disease in the RCA.  Her EF was normal.  Dr. Olevia Perches recommended a trial  of proton pump inhibitors   . CAD S/P percutaneous coronary angioplasty: Prior Cypher DES to pLAD, pOM2 & AVG Cx (after Om2); New DES to AVG Cx ISR & Angiosculpt PTCA of Ostial AVG Cx from OM2. 09/04/2006   Qualifier: Diagnosis of  By: Amil Amen MD, Benjamine Mola    . Chest pain   . COPD (chronic obstructive pulmonary disease) ([place])   .  COPD GOLD II / still smoking  09/04/2006   Followed in Pulmonary clinic/ [facility]/ [place]  Active smoker  - pft's 12/12/12 FEV1  1.41 (53%) ratio 54 with no significant reversibility and DLCO 47% corrects to 50 01/16/2013  Walked RA x 2 laps @ 185 ft each stopped due to sob, no desat     - Trial of anoro 01/16/2013 > helped but insurance would not pay - incruse one daily started 10/02/2013 >>>not covered  -Spiriva restarted 10/15/13 >  . Cyanosis   . DDD (degenerative disc disease)   . DEGENERATIVE DISC DISEASE, CERVICAL SPINE 09/04/2006   Annotation:  c6-7 on the right Qualifier: Diagnosis of  By: Amil Amen MD, Benjamine Mola    . Depression   . Diarrhea   . Diverticular disease   . DIVERTICULOSIS, COLON 09/04/2006   Qualifier: Diagnosis of  By: Amil Amen MD, Benjamine Mola    . GERD (gastroesophageal reflux disease)   . H/O: hysterectomy   . Hemorrhoids   . HTN (hypertension)   . IBS (irritable bowel syndrome)   . Insomnia   . Irritable bowel syndrome 09/04/2006   Qualifier: Diagnosis of  By: Amil Amen MD, Benjamine Mola    . Lymphadenitis   . PEPTIC ULCER DISEASE 09/04/2006   Annotation: distant history of  Qualifier: Diagnosis of  By: Amil Amen MD, Benjamine Mola    . Pharyngitis   . PHARYNGITIS 07/18/2007   Qualifier: Diagnosis of  By: Radene Ou MD, [place]    . PUD (peptic ulcer disease)   . PVD (peripheral vascular disease) ([place])   . Sinusitis   . TIA (transient ischemic attack)   . Tobacco abuse   . Vertigo     Past Surgical History:  Procedure Laterality Date  . ABDOMINAL HYSTERECTOMY    . APPENDECTOMY    . BREAST LUMPECTOMY Left 2015  . CARDIAC CATHETERIZATION  2005, 2006, 2009,2011  . CHOLECYSTECTOMY    . CORONARY STENT INTERVENTION N/A 04/22/2019   Procedure: CORONARY STENT INTERVENTION;  Surgeon: Troy Sine, MD;  Location: [place] [facility];  Service: Cardiovascular;  Laterality: N/A;  . CORONARY STENT PLACEMENT     Status post balloon angiogram plasty and Cypher stent to the distal    circumflex and OM2 branch  . ESOPHAGOGASTRODUODENOSCOPY (EGD) WITH PROPOFOL Left 04/21/2019   Procedure: ESOPHAGOGASTRODUODENOSCOPY (EGD) WITH PROPOFOL;  Surgeon: Arta Silence, MD;  Location: [place];  Service: Endoscopy;  Laterality: Left;  . Hammertoe repair    . Left Bunionectomy    . LEFT HEART CATH AND CORONARY ANGIOGRAPHY N/A 04/22/2019   Procedure: LEFT HEART CATH AND CORONARY ANGIOGRAPHY;  Surgeon: Troy Sine, MD;  Location: [place] [facility];  Service: Cardiovascular;  Laterality: N/A;  . LEFT HEART CATHETERIZATION WITH CORONARY  ANGIOGRAM N/A 12/03/2013   Procedure: LEFT HEART CATHETERIZATION WITH CORONARY ANGIOGRAM;  Surgeon: Leonie Man, MD;  Location: [facility];  Service: Cardiovascular;  Laterality: N/A;  . TUBAL LIGATION       reports that she has quit smoking. Her smoking use included cigarettes. She has a 14.75 pack-year smoking history. She has never used smokeless tobacco. She reports current drug use. She reports that she does not drink alcohol.  No Known Allergies  Family History  Problem Relation Age of Onset  . Heart disease Mother   . Rheumatologic disease Mother   . Cancer - Other Mother        Uterine  . Emphysema Father   . Heart disease Father   . Cancer -  Other Father   . Coronary artery disease Other   . Asthma Sister        2 sisters  . Stroke Sister     Prior to Admission medications   Medication Sig Start Date End Date Taking? Authorizing Provider  acetaminophen (TYLENOL) 325 MG tablet Take 2 tablets (650 mg total) by mouth every 4 (four) hours as needed for headache or mild pain. 04/23/19  Yes Sheikh, Omair Latif, DO  acidophilus (RISAQUAD) CAPS capsule Take 1 capsule by mouth 2 (two) times daily. 04/09/19  Yes [provider]  albuterol (PROVENTIL) (2.5 MG/3ML) 0.083% nebulizer solution USE 1 VIAL IN NEBULIZER EVERY 4 HOURS FOR WHEEZING OR SHORTNESS OF BREATH. Patient taking differently: Take 2.5 mg by nebulization every 4 (four) hours as needed for wheezing or shortness of breath.  04/29/19  Yes Tanda Rockers, MD  albuterol (VENTOLIN HFA) 108 (90 Base) MCG/ACT inhaler Inhale 2 puffs into the lungs every 6 (six) hours as needed for wheezing or shortness of breath. 07/22/19  Yes Martyn Ehrich, NP  apixaban (ELIQUIS) 5 MG TABS tablet Take 1 tablet (5 mg total) by mouth 2 (two) times daily. 05/30/19  Yes Josue Hector, MD  atorvastatin (LIPITOR) 80 MG tablet Take 80 mg by mouth daily.   Yes [provider]  blood glucose meter kit and supplies Dispense based on  patient and insurance preference. Use up to four times daily as directed. (FOR ICD-9 250.00, 250.01). Patient taking differently: 1 each by Other route See admin instructions. Dispense based on patient and insurance preference. Use up to four times daily as directed. (FOR ICD-9 250.00, 250.01). 01/13/15  Yes Reyne Dumas, MD  budesonide-formoterol (SYMBICORT) 160-4.5 MCG/ACT inhaler Inhale 2 puffs into the lungs 2 (two) times daily. 10/26/16  Yes Parrett, Tammy S, NP  calcium carbonate (OS-CAL) 600 MG TABS Take 600 mg by mouth every morning.    Yes [provider]  Cholecalciferol (VITAMIN D) 2000 UNITS tablet Take 2,000 Units by mouth every morning.    Yes [provider]  clopidogrel (PLAVIX) 75 MG tablet Take 1 tablet (75 mg total) by mouth daily with breakfast. 05/30/19  Yes Burtis Junes, NP  diltiazem (CARDIZEM CD) 120 MG 24 hr capsule Take 120 mg by mouth daily.  04/05/19  Yes [provider]  donepezil (ARICEPT) 5 MG tablet Take 5 mg by mouth daily.  01/10/18  Yes [provider]  DULoxetine (CYMBALTA) 60 MG capsule Take 60 mg by mouth every morning.    Yes [provider]  furosemide (LASIX) 40 MG tablet Take 40 mg by mouth every other day. 07/18/19  Yes [provider]  gabapentin (NEURONTIN) 300 MG capsule Take 300 mg by mouth 3 (three) times daily.    Yes [provider]  Lancets (FREESTYLE) lancets Use as instructed Patient taking differently: 1 each by Other route as directed.  01/13/15  Yes Reyne Dumas, MD  letrozole [facility]) 2.5 MG tablet Take 2.5 mg by mouth every morning.    Yes [provider]  lidocaine (LIDODERM) 5 % Place 1 patch onto the skin as needed. 08/16/19 11/14/19 Yes [provider]  metFORMIN (GLUCOPHAGE) 1000 MG tablet Take 1,000 mg by mouth 2 (two) times daily. 07/09/19  Yes [provider]  methocarbamol (ROBAXIN) 750 MG tablet Take 750 mg by mouth every 8 (eight) hours as needed  for muscle spasms.   Yes [provider]  metoprolol succinate (TOPROL-XL) 25 MG 24 hr  tablet Take 25 mg by mouth daily.  04/05/19  Yes [provider]  morphine 20 MG/5ML solution Take 0.03 mg by mouth See admin instructions. Taking 0.46m every 2 hours as needed 07/10/19  Yes [provider]  Multiple Vitamin (MULTIVITAMIN) tablet Take 1 tablet by mouth daily.    Yes [provider]  nitroGLYCERIN (NITROSTAT) 0.4 MG SL tablet Place 1 tablet (0.4 mg total) under the tongue every 5 (five) minutes as needed for chest pain (may repeat x3). 07/22/19  Yes WMartyn Ehrich NP  OXYGEN 1 each by Other route See admin instructions. Use 4L 24/7   Yes [provider]  pantoprazole (PROTONIX) 40 MG tablet Take 40 mg by mouth daily. 05/05/19  Yes [provider]  potassium chloride SA (KLOR-CON M20) 20 MEQ tablet Take 1 tablet (20 mEq total) by mouth daily. 07/01/19  Yes NJosue Hector MD  Respiratory Therapy Supplies (FLUTTER) DEVI Use as directed Patient taking differently: 1 each by Other route as directed.  07/13/17  Yes WTanda Rockers MD  Tiotropium Bromide Monohydrate (SPIRIVA RESPIMAT) 2.5 MCG/ACT AERS Inhale 2 puffs into the lungs daily. 10/26/16  Yes Parrett, TFonnie Mu NP    Physical Exam:  Constitutional: Elderly female who lethargic  Vitals:   09/04/19 1232 09/04/19 1247 09/04/19 1302 09/04/19 1317  BP: 131/60  (!) 121/57   Pulse: (!) 117  (!) 113 (!) 113  Resp: (!) 31 (!) 30 (!) 35 (!) 30  Temp:      TempSrc:      SpO2: 95%  96% 94%   Eyes: PERRL, lids and conjunctivae normal ENMT: Mucous membranes are moist. Posterior pharynx clear of any exudate or lesions.  Neck: normal, supple, no masses, no thyromegaly Respiratory: Decreased aeration currently on BiPAP with O2 saturations maintained. Cardiovascular: Tachycardic, no murmurs / rubs / gallops. No extremity edema. 2+ pedal pulses. No carotid bruits.  Abdomen: no tenderness, no masses  palpated. No hepatosplenomegaly. Bowel sounds positive.  Musculoskeletal: no clubbing / cyanosis. No joint deformity upper and lower extremities. Good ROM, no contractures. Normal muscle tone.  Skin: no rashes, lesions, ulcers. No induration Neurologic: Able to move all extremities to noxious stimuli. Psychiatric: Lethargic unable to arouse at this time.    Labs on Admission: I have personally reviewed following labs and imaging studies  CBC: Recent Labs  Lab 09/04/19 0935  WBC 8.9  HGB 11.6*  HCT 39.2  MCV 91.6  PLT 2540  Basic Metabolic Panel: Recent Labs  Lab 09/04/19 0935  NA 140  K 4.1  CL 96*  CO2 33*  GLUCOSE 181*  BUN 12  CREATININE 0.83  CALCIUM 9.6  MG 2.7*   GFR: CrCl cannot be calculated (Unknown ideal weight.). Liver Function Tests: Recent Labs  Lab 09/04/19 0935  AST 24  ALT 20  ALKPHOS 60  BILITOT 0.6  PROT 7.3  ALBUMIN 3.7   No results for input(s): LIPASE, AMYLASE in the last 168 hours. No results for input(s): AMMONIA in the last 168 hours. Coagulation Profile: No results for input(s): INR, PROTIME in the last 168 hours. Cardiac Enzymes: No results for input(s): CKTOTAL, CKMB, CKMBINDEX, TROPONINI in the last 168 hours. BNP (last 3 results) Recent Labs    04/15/19 1225  PROBNP 711*   HbA1C: No results for input(s): HGBA1C in the last 72 hours. CBG: No results for input(s): GLUCAP in the last 168 hours. Lipid Profile: No results for input(s): CHOL, HDL, LDLCALC, TRIG, CHOLHDL, LDLDIRECT  in the last 72 hours. Thyroid Function Tests: No results for input(s): TSH, T4TOTAL, FREET4, T3FREE, THYROIDAB in the last 72 hours. Anemia Panel: No results for input(s): VITAMINB12, FOLATE, FERRITIN, TIBC, IRON, RETICCTPCT in the last 72 hours. Urine analysis:    Component Value Date/Time   COLORURINE YELLOW 06/08/2019 0613   APPEARANCEUR CLEAR 06/08/2019 0613   LABSPEC 1.018 06/08/2019 0613   PHURINE 5.0 06/08/2019 0613   GLUCOSEU NEGATIVE  06/08/2019 0613   HGBUR NEGATIVE 06/08/2019 0613   BILIRUBINUR NEGATIVE 06/08/2019 0613   KETONESUR NEGATIVE 06/08/2019 0613   PROTEINUR 30 (A) 06/08/2019 0613   UROBILINOGEN 1.0 10/10/2009 2052   NITRITE NEGATIVE 06/08/2019 0613   LEUKOCYTESUR NEGATIVE 06/08/2019 1093   Sepsis Labs: Recent Results (from the past 240 hour(s))  SARS Coronavirus 2 by RT PCR (hospital order, performed in [facility] hospital lab) Nasopharyngeal Nasopharyngeal Swab     Status: None   Collection Time: 09/04/19  9:38 AM   Specimen: Nasopharyngeal Swab  Result Value Ref Range Status   SARS Coronavirus 2 NEGATIVE NEGATIVE Final    Comment: (NOTE) SARS-CoV-2 target nucleic acids are NOT DETECTED.  The SARS-CoV-2 RNA is generally detectable in upper and lower respiratory specimens during the acute phase of infection. The lowest concentration of SARS-CoV-2 viral copies this assay can detect is 250 copies / mL. A negative result does not preclude SARS-CoV-2 infection and should not be used as the sole basis for treatment or other patient management decisions.  A negative result may occur with improper specimen collection / handling, submission of specimen other than nasopharyngeal swab, presence of viral mutation(s) within the areas targeted by this assay, and inadequate number of viral copies (<250 copies / mL). A negative result must be combined with clinical observations, patient history, and epidemiological information.  Fact Sheet for Patients:   StrictlyIdeas.no  Fact Sheet for Healthcare Providers: BankingDealers.co.za  This test is not yet approved or  cleared by the [place] FDA and has been authorized for detection and/or diagnosis of SARS-CoV-2 by FDA under an Emergency Use Authorization (EUA).  This EUA will remain in effect (meaning this test can be used) for the duration of the COVID-19 declaration under Section 564(b)(1) of the Act, 21  U.S.C. section 360bbb-3(b)(1), unless the authorization is terminated or revoked sooner.  Performed at [facility], [place] [address] [place], [place] [address]      Radiological Exams on Admission: DG Chest Portable 1 View  Result Date: 09/04/2019 CLINICAL DATA:  dyspnea EXAM: PORTABLE CHEST 1 VIEW COMPARISON:  Chest radiograph 06/16/2019 FINDINGS: The heart size and mediastinal contours are within normal limits. Lungs are hyperinflated. Chronic bilateral coarse interstitial markings. No new focal infiltrate. No pneumothorax or significant pleural effusion. The visualized skeletal structures are unremarkable. IMPRESSION: No acute cardiopulmonary finding. Electronically Signed   By: Audie Pinto M.D.   On: 09/04/2019 09:58    EKG: Independently reviewed.  Sinus tachycardia 121 bpm wit LFPB and RBBB  Assessment/Plan   Respiratory failure with hypoxia and hypercapnia COPD exacerbation: Acute on chronic.  Patient with history of COPD with chronic respiratory failure normally on 4 L of nasal cannula oxygen.  Chest x-ray was otherwise noted to be clear.  Patient with decreased overall aeration on physical exam.  Initial ABG revealed pH of 7.17 with PCO2 seen to be still elevated it was unable to be calculated.  Family confirmed DO NOT RESUSCITATE and DNI status but were okay with BiPAP. -Admit to a progressive bed -  Continuous pulse oximetry -N.p.o. while on BiPAP -BiPAP -Recheck ABG now -DuoNebs 4 times daily  -Solu-Medrol 60 mg every 8 hours -Azithromycin IV  Paroxysmal atrial fibrillation on chronic anticoagulation: Home medications include Eliquis. -Lovenox per pharmacy -Restart Eliquis when able to tolerate p.o.  Essential hypertension: Home blood pressure medications include Cardizem 120 mg daily, furosemide 40 mg every other day, and metoprolol 25 mg daily. -Labetalol IV as needed. -Restart home blood pressure medications when able  Normocytic anemia: Hemoglobin 11.6  on admission.  No reports of bleeding.  Dementia -Restart Aricept when able  DNR present on admission  DVT prophylaxis: Lovenox Code Status: DNR/DNI Family Communication: Discussed plan of care with both daughters, but Patricia Wall is the primary caregiver and should be updated first. Disposition Plan: To be determined Consults called: none Admission status: Inpatient   Norval Morton MD [facility] Pager [phone]   If 7PM-7AM, please contact night-coverage [email or web address] Password [email or web address]  09/04/2019, 1:23 PM

## 2019-09-04 NOTE — Progress Notes (Signed)
Pt removed from BiPAP.  Pt awake, alert and currently has O2 sats of 100% on 4L N/C.  Pt states she wears 4L at home.

## 2019-09-05 ENCOUNTER — Other Ambulatory Visit: Payer: Self-pay

## 2019-09-05 DIAGNOSIS — J9622 Acute and chronic respiratory failure with hypercapnia: Secondary | ICD-10-CM

## 2019-09-05 DIAGNOSIS — I10 Essential (primary) hypertension: Secondary | ICD-10-CM

## 2019-09-05 DIAGNOSIS — J9621 Acute and chronic respiratory failure with hypoxia: Secondary | ICD-10-CM

## 2019-09-05 DIAGNOSIS — I480 Paroxysmal atrial fibrillation: Secondary | ICD-10-CM

## 2019-09-05 DIAGNOSIS — J441 Chronic obstructive pulmonary disease with (acute) exacerbation: Principal | ICD-10-CM

## 2019-09-05 LAB — GLUCOSE, CAPILLARY
Glucose-Capillary: 161 mg/dL — ABNORMAL HIGH (ref 70–99)
Glucose-Capillary: 157 mg/dL — ABNORMAL HIGH (ref 70–99)
Glucose-Capillary: 157 mg/dL — ABNORMAL HIGH (ref 70–99)
Glucose-Capillary: 293 mg/dL — ABNORMAL HIGH (ref 70–99)
Glucose-Capillary: 174 mg/dL — ABNORMAL HIGH (ref 70–99)
Glucose-Capillary: 249 mg/dL — ABNORMAL HIGH (ref 70–99)

## 2019-09-05 LAB — CBC
HCT: 33.2 % — ABNORMAL LOW (ref 36.0–46.0)
Hemoglobin: 10.2 g/dL — ABNORMAL LOW (ref 12.0–15.0)
MCH: 27.2 pg (ref 26.0–34.0)
MCHC: 30.7 g/dL (ref 30.0–36.0)
MCV: 88.5 fL (ref 80.0–100.0)
Platelets: 172 10*3/uL (ref 150–400)
RBC: 3.75 MIL/uL — ABNORMAL LOW (ref 3.87–5.11)
RDW: 14.5 % (ref 11.5–15.5)
WBC: 8.5 10*3/uL (ref 4.0–10.5)
nRBC: 0 % (ref 0.0–0.2)

## 2019-09-05 LAB — BASIC METABOLIC PANEL
Anion gap: 9 (ref 5–15)
BUN: 21 mg/dL (ref 8–23)
CO2: 34 mmol/L — ABNORMAL HIGH (ref 22–32)
Calcium: 9.4 mg/dL (ref 8.9–10.3)
Chloride: 98 mmol/L (ref 98–111)
Creatinine, Ser: 0.81 mg/dL (ref 0.44–1.00)
GFR calc Af Amer: >60 mL/min (ref >60)
GFR calc non Af Amer: >60 mL/min (ref >60)
Glucose, Bld: 163 mg/dL — ABNORMAL HIGH (ref 70–99)
Potassium: 5 mmol/L (ref 3.5–5.1)
Sodium: 141 mmol/L (ref 135–145)

## 2019-09-05 MED ORDER — CLOPIDOGREL BISULFATE 75 MG PO TABS
75.0000 mg | ORAL_TABLET | Freq: Every day | ORAL | Status: DC
  Administered 2019-09-05 – 2019-09-06 (×2): 75 mg via ORAL
  Filled 2019-09-05 (×2): qty 1

## 2019-09-05 MED ORDER — DONEPEZIL HCL 5 MG PO TABS
5.0000 mg | ORAL_TABLET | Freq: Every day | ORAL | Status: DC
  Administered 2019-09-05 – 2019-09-06 (×2): 5 mg via ORAL
  Filled 2019-09-05 (×2): qty 1

## 2019-09-05 MED ORDER — APIXABAN 5 MG PO TABS
5.0000 mg | ORAL_TABLET | Freq: Two times a day (BID) | ORAL | Status: DC
  Administered 2019-09-05 – 2019-09-06 (×3): 5 mg via ORAL
  Filled 2019-09-05 (×3): qty 1

## 2019-09-05 MED ORDER — IPRATROPIUM-ALBUTEROL 0.5-2.5 (3) MG/3ML IN SOLN
3.0000 mL | Freq: Three times a day (TID) | RESPIRATORY_TRACT | Status: DC
  Administered 2019-09-06: 3 mL via RESPIRATORY_TRACT
  Filled 2019-09-05 (×2): qty 3

## 2019-09-05 MED ORDER — POTASSIUM CHLORIDE CRYS ER 20 MEQ PO TBCR
20.0000 meq | EXTENDED_RELEASE_TABLET | Freq: Every day | ORAL | Status: DC
  Administered 2019-09-05 – 2019-09-06 (×2): 20 meq via ORAL
  Filled 2019-09-05 (×2): qty 1

## 2019-09-05 MED ORDER — METHYLPREDNISOLONE SODIUM SUCC 40 MG IJ SOLR
40.0000 mg | Freq: Two times a day (BID) | INTRAMUSCULAR | Status: DC
  Administered 2019-09-05 – 2019-09-06 (×2): 40 mg via INTRAVENOUS
  Filled 2019-09-05 (×2): qty 1

## 2019-09-05 MED ORDER — INSULIN ASPART 100 UNIT/ML ~~LOC~~ SOLN
0.0000 [IU] | Freq: Three times a day (TID) | SUBCUTANEOUS | Status: DC
  Administered 2019-09-05: 2 [IU] via SUBCUTANEOUS
  Administered 2019-09-05: 5 [IU] via SUBCUTANEOUS
  Administered 2019-09-06: 3 [IU] via SUBCUTANEOUS

## 2019-09-05 MED ORDER — LIDOCAINE 5 % EX PTCH: 1.0000 | MEDICATED_PATCH | Freq: Every day | CUTANEOUS | Status: DC | PRN

## 2019-09-05 MED ORDER — LETROZOLE 2.5 MG PO TABS
2.5000 mg | ORAL_TABLET | Freq: Every day | ORAL | Status: DC
  Administered 2019-09-05 – 2019-09-06 (×2): 2.5 mg via ORAL
  Filled 2019-09-05 (×2): qty 1

## 2019-09-05 MED ORDER — PANTOPRAZOLE SODIUM 40 MG PO TBEC
40.0000 mg | DELAYED_RELEASE_TABLET | Freq: Every day | ORAL | Status: DC
  Administered 2019-09-05 – 2019-09-06 (×2): 40 mg via ORAL
  Filled 2019-09-05 (×2): qty 1

## 2019-09-05 MED ORDER — METOPROLOL SUCCINATE ER 25 MG PO TB24
25.0000 mg | ORAL_TABLET | Freq: Every day | ORAL | Status: DC
  Administered 2019-09-05 – 2019-09-06 (×2): 25 mg via ORAL
  Filled 2019-09-05 (×2): qty 1

## 2019-09-05 MED ORDER — GABAPENTIN 300 MG PO CAPS
300.0000 mg | ORAL_CAPSULE | Freq: Three times a day (TID) | ORAL | Status: DC
  Administered 2019-09-05 – 2019-09-06 (×4): 300 mg via ORAL
  Filled 2019-09-05 (×4): qty 1

## 2019-09-05 MED ORDER — DULOXETINE HCL 60 MG PO CPEP
60.0000 mg | ORAL_CAPSULE | Freq: Every day | ORAL | Status: DC
  Administered 2019-09-05 – 2019-09-06 (×2): 60 mg via ORAL
  Filled 2019-09-05: qty 2
  Filled 2019-09-05: qty 1

## 2019-09-05 MED ORDER — FUROSEMIDE 40 MG PO TABS
40.0000 mg | ORAL_TABLET | ORAL | Status: DC
  Administered 2019-09-05: 40 mg via ORAL
  Filled 2019-09-05: qty 1

## 2019-09-05 MED ORDER — DILTIAZEM HCL ER COATED BEADS 120 MG PO CP24
120.0000 mg | ORAL_CAPSULE | Freq: Every day | ORAL | Status: DC
  Administered 2019-09-05 – 2019-09-06 (×2): 120 mg via ORAL
  Filled 2019-09-05 (×2): qty 1

## 2019-09-05 MED ORDER — ATORVASTATIN CALCIUM 80 MG PO TABS
80.0000 mg | ORAL_TABLET | Freq: Every day | ORAL | Status: DC
  Administered 2019-09-05 – 2019-09-06 (×2): 80 mg via ORAL
  Filled 2019-09-05 (×2): qty 1

## 2019-09-05 NOTE — TOC Initial Note (Addendum)
Transition of Care [facility]) - Initial/Assessment Note    Patient Details  Name: Patricia Wall MRN: [id number] Date of Birth: 10/10/1950  Transition of Care [facility]) CM/SW Contact:    Bartholomew Crews, RN Phone Number: [phone] 09/05/2019, 11:16 AM  Clinical Narrative:                  10:45 am - Notified by attending MD that daughter, [name], is seeking hospice care for her mom (patient). Spoke with patient and [name] at bedside. Patient is followed by [facility] for palliative care. [name] has already reached out to palliative nurse at [facility] about transition to hospice care. Discussed need for hospital bed d/t patient needing to keep head of bed up.Patient already has a cpap through [facility] and oxygen through [facility].  NCM reached out to [place], liaison for [facility], who will follow up with [facility] and order hospital bed. [name] will transport patient home in private vehicle. Per MD anticipate readiness to transition 1-2 days.   12:00 pm - Notified by Caryl Pina that DME will be delivered to home tomorrow morning. Attending MD notified and will plan to dc patient home with hospice services tomorrow.   TOC following for transition needs.   Expected Discharge Plan: Home w Hospice Care Barriers to Discharge: Continued Medical Work up   Patient Goals and CMS Choice Patient states their goals for this hospitalization and ongoing recovery are:: home with her daughter with hospice CMS Medicare.gov Compare Post Acute Care list provided to:: Patient Choice offered to / list presented to : Patient, Adult Children  Expected Discharge Plan and Services Expected Discharge Plan: [place]   Discharge Planning Services: CM Consult Post Acute Care Choice: Hospice Living arrangements for the past 2 months: Single Family Home                 DME Arranged: N/A DME Agency: NA       HH Arranged: NA HH Agency: NA        Prior Living Arrangements/Services Living arrangements for the  past 2 months: Single Family Home Lives with:: Self, Adult Children Patient language and need for interpreter reviewed:: Yes Do you feel safe going back to the place where you live?: Yes      Need for Family Participation in Patient Care: Yes (Comment) Care giver support system in place?: Yes (comment) Current home services: DME, Other (comment) (palliative care with [facility]) Criminal Activity/Legal Involvement Pertinent to Current Situation/Hospitalization: No - Comment as needed  Activities of Daily Living Home Assistive Devices/Equipment: Dentures (specify type), Wheelchair ADL Screening (condition at time of admission) Patient's cognitive ability adequate to safely complete daily activities?: No Is the patient deaf or have difficulty hearing?: No Does the patient have difficulty seeing, even when wearing glasses/contacts?: No Does the patient have difficulty concentrating, remembering, or making decisions?: Yes Patient able to express need for assistance with ADLs?: Yes Does the patient have difficulty dressing or bathing?: Yes Independently performs ADLs?: No Communication: Independent Dressing (OT): Needs assistance Is this a change from baseline?: Pre-admission baseline Grooming: Needs assistance Is this a change from baseline?: Pre-admission baseline Feeding: Independent Bathing: Needs assistance Is this a change from baseline?: Pre-admission baseline Toileting: Needs assistance Is this a change from baseline?: Pre-admission baseline In/Out Bed: Needs assistance Is this a change from baseline?: Pre-admission baseline Walks in Home: Dependent Is this a change from baseline?: Pre-admission baseline Does the patient have difficulty walking or climbing stairs?: Yes Weakness of Legs: Both Weakness  of Arms/Hands: None  Permission Sought/Granted Permission sought to share information with : Family Supports Permission granted to share information with : Yes, Verbal Permission  Granted  Share Information with NAME: Leeroy Bock     Permission granted to share info w Relationship: daughter  Permission granted to share info w Contact Information: [phone]  Emotional Assessment Appearance:: Appears stated age Attitude/Demeanor/Rapport: Engaged Affect (typically observed): Accepting Orientation: : Oriented to Self, Oriented to  Time, Oriented to Place, Oriented to Situation Alcohol / Substance Use: Not Applicable Psych Involvement: No (comment)  Admission diagnosis:  COPD exacerbation (HCC) [J44.1] Acute on chronic respiratory failure with hypercapnia (HCC) [J96.22] Acute on chronic respiratory failure with hypoxia and hypercapnia (HCC) [Q33.00, J96.22] Patient Active Problem List   Diagnosis Date Noted  . Acute on chronic respiratory failure with hypoxia and hypercapnia (HCC) 09/04/2019  . DNR (do not resuscitate) 09/04/2019  . ARF (acute renal failure) (HCC) 04/27/2019  . Hypotension 04/27/2019  . Diabetes mellitus type 2 in nonobese (HCC) 04/27/2019  . Pressure injury of skin 04/23/2019  . Acute on chronic respiratory failure with hypercapnia (HCC)   . SOB (shortness of breath)   . Acute diastolic CHF (congestive heart failure) (HCC)   . Exertional angina (HCC)   . Atrial fibrillation with RVR (HCC)   . Normocytic anemia 04/18/2019  . Thrombocytopenia (HCC) 04/18/2019  . PAF (paroxysmal atrial fibrillation) (HCC) 04/18/2019  . Chronic respiratory failure with hypoxia and hypercapnia (HCC) 03/29/2018  . Acute respiratory failure with hypoxia (HCC) 01/07/2015  . CAD (coronary artery disease) 01/07/2015  . Obesity 11/01/2014  . COPD exacerbation (HCC) 12/30/2013  . Chest pain 12/03/2013  . Atherosclerotic heart disease of native coronary artery with angina pectoris (HCC) 12/03/2013    Class: Diagnosis of  . Syncope 12/02/2013  . Dyspnea 10/02/2013  . Acute upper respiratory infections of unspecified site 04/03/2013  . Right carotid bruit 11/15/2012   . Peripheral vascular disease (HCC) 09/30/2009  . Nonspecific (abnormal) findings on radiological and other examination of body structure 09/02/2009  . ABNORMAL CHEST XRAY 09/02/2009  . TINNITUS 04/15/2008  . COUGH 04/15/2008  . VERTIGO 02/04/2008  . PHARYNGITIS 07/18/2007  . LYMPHADENITIS, CERVICAL 07/18/2007  . Angina, class III (HCC) 03/29/2007  . CONDYLOMA ACUMINATUM 03/01/2007  . SINUSITIS, ACUTE 03/01/2007  . INSOMNIA 01/30/2007  . Elevated lipids 09/04/2006  . ABUSE, ALCOHOL, UNSPECIFIED 09/04/2006  . Cigarette smoker 09/04/2006  . DEPRESSION 09/04/2006  . Essential hypertension 09/04/2006  . CAD S/P percutaneous coronary angioplasty: Prior Cypher DES to pLAD, pOM2 & AVG Cx (after Om2); New DES to AVG Cx ISR & Angiosculpt PTCA of Ostial AVG Cx from OM2. 09/04/2006  . HEMORRHOIDS 09/04/2006  . COPD GOLD II / still smoking  09/04/2006  . GERD 09/04/2006  . PEPTIC ULCER DISEASE 09/04/2006  . DIVERTICULOSIS, COLON 09/04/2006  . Irritable bowel syndrome 09/04/2006  . DEGENERATIVE DISC DISEASE, CERVICAL SPINE 09/04/2006  . LOW BACK PAIN 09/04/2006  . HEADACHE 09/04/2006  . History of cardiovascular disorder 09/04/2006  . ONYCHOMYCOSIS, TOENAILS 09/01/2006  . DIARRHEA 02/18/2006   PCP:  Tracey Harries, MD Pharmacy:   [facility] [address] [place] - [address] [place] [address] Phone: [phone] Fax: [phone]  [facility] - [place], [place] - [address] [place] [address] Phone: [phone] Fax: [phone]  [id number] [place], [place] - [address] [place] [address] Phone: [phone] Fax:  [phone]     Social Determinants of Health (SDOH) Interventions    Readmission Risk Interventions No flowsheet data found.

## 2019-09-05 NOTE — Evaluation (Signed)
Physical Therapy Evaluation Patient Details Name: Patricia Wall MRN: [id number] DOB: November 04, 1950 Today's Date: 09/05/2019   History of Present Illness  69 y.o. female COPD with chronic hypoxic respiratory failure on 4 L of oxygen at home, breast cancer-s/p lumpectomy/radiation therapy, CAD, HTN, chronic diastolic heart failure who presented with acute on chronic hypoxic/hypercarbic respiratory failure in the setting of COPD exacerbation  Clinical Impression  Pt presents to PT with deficits in functional mobility, gait, balance, endurance, strength, power, and activity tolerance. Pt reports having very low activity tolerance at baseline, only standing and pivoting and then requiring a rest break. Pt does report that she is able to complete most transfers independently and requires some physical assistance to steady at this time when doing so. Pt declines attempts at stair negotiation at this time but PT and the patient do discuss the patient's stair negotiation process with assistance of her daughter and PT feels they complete it in the safest manner possible with the patient's chronic deficits. Pt will benefit from continued acute PT POC to continue to reinforce energy conservation methods and to aide in improving activity tolerance. PT recommends home health PT, however per chart review pt and daughter plan to discharge home with hospice services tomorrow.    Follow Up Recommendations Home health PT;Supervision for mobility/OOB (plan to D/C home with hospice services per notes)    Equipment Recommendations  Hospital bed    Recommendations for Other Services       Precautions / Restrictions Precautions Precautions: Fall Precaution Comments: 4L [place] Restrictions Weight Bearing Restrictions: No      Mobility  Bed Mobility Overal bed mobility: Needs Assistance Bed Mobility: Supine to Sit     Supine to sit: Supervision;HOB elevated        Transfers Overall transfer level: Needs  assistance Equipment used: 1 person hand held assist Transfers: Sit to/from Stand;Stand Pivot Transfers Sit to Stand: Min assist Stand pivot transfers: Min assist       General transfer comment: pt requires minA to steady balance during transfers  Ambulation/Gait                Stairs Stairs:  (pt declines at this time)          Wheelchair Mobility    Modified Rankin (Stroke Patients Only)       Balance Overall balance assessment: Needs assistance Sitting-balance support: No upper extremity supported;Feet supported Sitting balance-Leahy Scale: Good Sitting balance - Comments: supervision   Standing balance support: Single extremity supported Standing balance-Leahy Scale: Fair Standing balance comment: minG-minA for static standing balance                             Pertinent Vitals/Pain Pain Assessment: Faces Faces Pain Scale: Hurts little more Pain Location: feet Pain Descriptors / Indicators: Grimacing Pain Intervention(s): Monitored during session    Home Living Family/patient expects to be discharged to:: Private residence Living Arrangements: Children Available Help at Discharge: Family;Available 24 hours/day Type of Home: House Home Access: Stairs to enter Entrance Stairs-Rails: Psychiatric nurse of Steps: 2 Home Layout: One level Home Equipment: Walker - 4 wheels;Bedside commode;Shower seat;Wheelchair - Banker      Prior Function Level of Independence: Needs assistance   Gait / Transfers Assistance Needed: pt requires intermittent assistance for transfers, community mobility in transport chair, and for stair negotiation  ADL's / Homemaking Assistance Needed: pt requires assistance for dressing  Hand Dominance        Extremity/Trunk Assessment   Upper Extremity Assessment Upper Extremity Assessment: Generalized weakness    Lower Extremity Assessment Lower Extremity Assessment:  Generalized weakness    Cervical / Trunk Assessment Cervical / Trunk Assessment: Kyphotic  Communication   Communication: No difficulties  Cognition Arousal/Alertness: Awake/alert Behavior During Therapy: WFL for tasks assessed/performed Overall Cognitive Status: Within Functional Limits for tasks assessed                                        General Comments General comments (skin integrity, edema, etc.): pt on 4L [place], VSS at rest, with mobility pt does desat from 95% to 88% SpO2, saturation increases with seated resting and is back to 92% or above within 2 minutes. Pt reports this is near her baseline.    Exercises     Assessment/Plan    PT Assessment Patient needs continued PT services  PT Problem List Decreased strength;Decreased activity tolerance;Decreased balance;Decreased mobility;Cardiopulmonary status limiting activity;Impaired sensation       PT Treatment Interventions DME instruction;Stair training;Functional mobility training;Therapeutic activities;Therapeutic exercise;Balance training;Neuromuscular re-education;Patient/family education;Wheelchair mobility training    PT Goals (Current goals can be found in the Care Plan section)  Acute Rehab PT Goals Patient Stated Goal: To go home PT Goal Formulation: With patient Time For Goal Achievement: 09/19/19 Potential to Achieve Goals: Fair Additional Goals Additional Goal #1: Pt will mobilize in manual wheelchair for 25' with supervision.    Frequency Min 3X/week   Barriers to discharge        Co-evaluation               AM-PAC PT "6 Clicks" Mobility  Outcome Measure Help needed turning from your back to your side while in a flat bed without using bedrails?: None Help needed moving from lying on your back to sitting on the side of a flat bed without using bedrails?: None Help needed moving to and from a bed to a chair (including a wheelchair)?: A Little Help needed standing up from a  chair using your arms (e.g., wheelchair or bedside chair)?: A Little Help needed to walk in hospital room?: A Lot Help needed climbing 3-5 steps with a railing? : A Lot 6 Click Score: 18    End of Session Equipment Utilized During Treatment: Oxygen Activity Tolerance: Patient limited by fatigue Patient left: in chair;with call bell/phone within reach Nurse Communication: Mobility status PT Visit Diagnosis: Other abnormalities of gait and mobility (R26.89);Unsteadiness on feet (R26.81)    Time: 1430-1455 PT Time Calculation (min) (ACUTE ONLY): 25 min   Charges:   PT Evaluation $PT Eval Moderate Complexity: 1 Mod          Arlyss Gandy, PT, DPT Acute Rehabilitation Pager: [phone]   Arlyss Gandy 09/05/2019, 3:23 PM

## 2019-09-05 NOTE — Progress Notes (Signed)
PROGRESS NOTE        PATIENT DETAILS Name: Patricia Wall Age: 69 y.o. Sex: female Date of Birth: 1950/06/06 Admit Date: 09/04/2019 Admitting Physician Clydie Braun, MD MBT:DHRCBU, Onalee Hua, MD  Brief Narrative: Patient is a 69 y.o. female COPD with chronic hypoxic respiratory failure on 4 L of oxygen at home, breast cancer-s/p lumpectomy/radiation therapy, CAD, HTN, chronic diastolic heart failure who presented with acute on chronic hypoxic/hypercarbic respiratory failure in the setting of COPD exacerbation.  She was placed on BiPAP and admitted to the hospitalist service.  See below for further details.  Significant events: 6/16>> admit-acute on chronic hypoxic/ hypercarbic respiratory failure due to to COPD exacerbation-BiPAP 6/17>> liberated off BiPAP overnight-back on 4 L of nasal cannula  Significant studies: 6/16>> chest x-ray: No acute cardiopulmonary finding.  Antimicrobial therapy: Zithromax: 6/16>>  Microbiology data: None  Procedures : None  Consults: None  DVT Prophylaxis : apixaban (ELIQUIS) tablet 5 mg   Subjective: Feels much better than yesterday-asking for food-was kept n.p.o. as patient was on BiPAP till a few hours back.  Assessment/Plan: Acute on chronic hypercarbic and hypoxic respiratory failure: Due to COPD exacerbation.  Improved-initially required BiPAP-back on 4 L of oxygen.  Continue supportive care.  COPD exacerbation.  Improved-continue steroids-but taper and bronchodilators-Zithromax x3 days.  PAF: Maintaining sinus rhythm-rate controlled with Cardizem-stop Lovenox-transition back to Eliquis  CAD s/p last PCI February 2021: No anginal symptoms-continue Plavix, statin and beta-blocker.  HTN: BP controlled-continue Cardizem, metoprolol  Chronic diastolic heart failure: Compensated-continue furosemide.  DM-2: CBG stable-hold Metformin-continue SSI.  Recent Labs    09/04/19 2105 09/05/19 0423 09/05/19 0817    GLUCAP 158* 161* 157*   History of breast cancer: Continue letrozole-continue follow-up with oncology in the outpatient setting.  History of dementia: Mild-maintain delirium precautions-continue Aricept  Chronic debility: Per patient's daughter-patient mostly wheelchair-bound-but able to take a few steps-to go to the bathroom etc.  We will go ahead and obtain PT OT.  Palliative care: DNR in place-daughter already in the process of setting up outpatient hospice-requests that we see if we can accelerate/help in the process.  Will consult case management.  Diet: Diet Order            Diet heart healthy/carb modified Room service appropriate? Yes; Fluid consistency: Thin  Diet effective now                 Code Status: DNR  Family Communication: Daughter-[name] over the phone  Disposition Plan: Status is: Inpatient  Remains inpatient appropriate because:Inpatient level of care appropriate due to severity of illness   Dispo: The patient is from: Home              Anticipated d/c is to: Home              Anticipated d/c date is: 2 days              Patient currently is not medically stable to d/c.  Barriers to Discharge: Improving respiratory failure due to COPD exacerbation-on IV steroids-await further improvement-await evaluation by rehab services to ensure safe disposition.  Antimicrobial agents: Anti-infectives (From admission, onward)   Start     Dose/Rate Route Frequency Ordered Stop   09/04/19 1830  azithromycin (ZITHROMAX) 500 mg in sodium chloride 0.9 % 250 mL IVPB     Discontinue  500 mg 250 mL/hr over 60 Minutes Intravenous Every 24 hours 09/04/19 1818         Time spent: 25 minutes-Greater than 50% of this time was spent in counseling, explanation of diagnosis, planning of further management, and coordination of care.  MEDICATIONS: Scheduled Meds: . apixaban  5 mg Oral BID  . atorvastatin  80 mg Oral Daily  . clopidogrel  75 mg Oral Q breakfast  .  diltiazem  120 mg Oral Daily  . donepezil  5 mg Oral Daily  . DULoxetine  60 mg Oral Daily  . furosemide  40 mg Oral QODAY  . gabapentin  300 mg Oral TID  . insulin aspart  0-9 Units Subcutaneous TID WC  . ipratropium-albuterol  3 mL Nebulization Once  . ipratropium-albuterol  3 mL Nebulization QID  . letrozole  2.5 mg Oral Daily  . methylPREDNISolone (SOLU-MEDROL) injection  60 mg Intravenous Q8H  . metoprolol succinate  25 mg Oral Daily  . pantoprazole  40 mg Oral Daily  . potassium chloride SA  20 mEq Oral Daily  . sodium chloride flush  3 mL Intravenous Q12H   Continuous Infusions: . azithromycin Stopped (09/04/19 2036)   PRN Meds:.acetaminophen **OR** acetaminophen, labetalol, lidocaine, ondansetron **OR** ondansetron (ZOFRAN) IV   PHYSICAL EXAM: Vital signs: Vitals:   09/05/19 0325 09/05/19 0400 09/05/19 0423 09/05/19 0848  BP:  110/61    Pulse: 97 95    Resp: (!) 22 16    Temp:  97.7 F (36.5 C)    TempSrc:  Axillary    SpO2: 97% 96%  94%  Weight:   67 kg   Height:       Filed Weights   09/04/19 2116 09/05/19 0423  Weight: 67.4 kg 67 kg   Body mass index is 23.84 kg/m.   Gen Exam:Alert awake-not in any distress HEENT:atraumatic, normocephalic Chest: B/L clear to auscultation anteriorly-few scattered rhonchi. CVS:S1S2 regular Abdomen:soft non tender, non distended Extremities:no edema Neurology: Non focal Skin: no rash  I have personally reviewed following labs and imaging studies  LABORATORY DATA: CBC: Recent Labs  Lab 09/04/19 0935 09/04/19 1509 09/05/19 0519  WBC 8.9  --  8.5  HGB 11.6* 11.2* 10.2*  HCT 39.2 33.0* 33.2*  MCV 91.6  --  88.5  PLT 204  --  654    Basic Metabolic Panel: Recent Labs  Lab 09/04/19 0935 09/04/19 1509 09/05/19 0519  NA 140 140 141  K 4.1 4.7 5.0  CL 96*  --  98  CO2 33*  --  34*  GLUCOSE 181*  --  163*  BUN 12  --  21  CREATININE 0.83  --  0.81  CALCIUM 9.6  --  9.4  MG 2.7*  --   --      GFR: Estimated Creatinine Clearance: 59.6 mL/min (by C-G formula based on SCr of 0.81 mg/dL).  Liver Function Tests: Recent Labs  Lab 09/04/19 0935  AST 24  ALT 20  ALKPHOS 60  BILITOT 0.6  PROT 7.3  ALBUMIN 3.7   No results for input(s): LIPASE, AMYLASE in the last 168 hours. No results for input(s): AMMONIA in the last 168 hours.  Coagulation Profile: No results for input(s): INR, PROTIME in the last 168 hours.  Cardiac Enzymes: No results for input(s): CKTOTAL, CKMB, CKMBINDEX, TROPONINI in the last 168 hours.  BNP (last 3 results) Recent Labs    04/15/19 1225  PROBNP 711*    Lipid Profile: No results for input(s):  CHOL, HDL, LDLCALC, TRIG, CHOLHDL, LDLDIRECT in the last 72 hours.  Thyroid Function Tests: No results for input(s): TSH, T4TOTAL, FREET4, T3FREE, THYROIDAB in the last 72 hours.  Anemia Panel: No results for input(s): VITAMINB12, FOLATE, FERRITIN, TIBC, IRON, RETICCTPCT in the last 72 hours.  Urine analysis:    Component Value Date/Time   COLORURINE YELLOW 06/08/2019 0613   APPEARANCEUR CLEAR 06/08/2019 0613   LABSPEC 1.018 06/08/2019 0613   PHURINE 5.0 06/08/2019 0613   GLUCOSEU NEGATIVE 06/08/2019 0613   HGBUR NEGATIVE 06/08/2019 0613   BILIRUBINUR NEGATIVE 06/08/2019 0613   KETONESUR NEGATIVE 06/08/2019 0613   PROTEINUR 30 (A) 06/08/2019 0613   UROBILINOGEN 1.0 10/10/2009 2052   NITRITE NEGATIVE 06/08/2019 0613   LEUKOCYTESUR NEGATIVE 06/08/2019 0613    Sepsis Labs: Lactic Acid, Venous    Component Value Date/Time   LATICACIDVEN 1.04 01/07/2015 1910    MICROBIOLOGY: Recent Results (from the past 240 hour(s))  SARS Coronavirus 2 by RT PCR (hospital order, performed in [facility] hospital lab) Nasopharyngeal Nasopharyngeal Swab     Status: None   Collection Time: 09/04/19  9:38 AM   Specimen: Nasopharyngeal Swab  Result Value Ref Range Status   SARS Coronavirus 2 NEGATIVE NEGATIVE Final    Comment: (NOTE) SARS-CoV-2 target  nucleic acids are NOT DETECTED.  The SARS-CoV-2 RNA is generally detectable in upper and lower respiratory specimens during the acute phase of infection. The lowest concentration of SARS-CoV-2 viral copies this assay can detect is 250 copies / mL. A negative result does not preclude SARS-CoV-2 infection and should not be used as the sole basis for treatment or other patient management decisions.  A negative result may occur with improper specimen collection / handling, submission of specimen other than nasopharyngeal swab, presence of viral mutation(s) within the areas targeted by this assay, and inadequate number of viral copies (<250 copies / mL). A negative result must be combined with clinical observations, patient history, and epidemiological information.  Fact Sheet for Patients:   BoilerBrush.com.cy  Fact Sheet for Healthcare Providers: https://pope.com/  This test is not yet approved or  cleared by the [place] FDA and has been authorized for detection and/or diagnosis of SARS-CoV-2 by FDA under an Emergency Use Authorization (EUA).  This EUA will remain in effect (meaning this test can be used) for the duration of the COVID-19 declaration under Section 564(b)(1) of the Act, 21 U.S.C. section 360bbb-3(b)(1), unless the authorization is terminated or revoked sooner.  Performed at [facility], [address] [place], [place] [address]     RADIOLOGY STUDIES/RESULTS: DG Chest Portable 1 View  Result Date: 09/04/2019 CLINICAL DATA:  dyspnea EXAM: PORTABLE CHEST 1 VIEW COMPARISON:  Chest radiograph 06/16/2019 FINDINGS: The heart size and mediastinal contours are within normal limits. Lungs are hyperinflated. Chronic bilateral coarse interstitial markings. No new focal infiltrate. No pneumothorax or significant pleural effusion. The visualized skeletal structures are unremarkable. IMPRESSION: No acute cardiopulmonary  finding. Electronically Signed   By: Emmaline Kluver M.D.   On: 09/04/2019 09:58     LOS: 1 day   Jeoffrey Massed, MD  [facility]    To contact the attending provider between 7A-7P or the covering provider during after hours 7P-7A, please log into the web site [email or web address] and access using universal [place] password for that web site. If you do not have the password, please call the hospital operator.  09/05/2019, 10:14 AM

## 2019-09-05 NOTE — Plan of Care (Signed)
New admit

## 2019-09-06 LAB — GLUCOSE, CAPILLARY
Glucose-Capillary: 207 mg/dL — ABNORMAL HIGH (ref 70–99)
Glucose-Capillary: 276 mg/dL — ABNORMAL HIGH (ref 70–99)

## 2019-09-06 LAB — MRSA PCR SCREENING: MRSA by PCR: NEGATIVE

## 2019-09-06 MED ORDER — PREDNISONE 10 MG PO TABS
ORAL_TABLET | ORAL | 0 refills | Status: DC
Start: 2019-09-06 — End: 2019-09-23

## 2019-09-06 MED ORDER — ORAL CARE MOUTH RINSE
15.0000 mL | Freq: Two times a day (BID) | OROMUCOSAL | Status: DC
  Administered 2019-09-06: 15 mL via OROMUCOSAL

## 2019-09-06 NOTE — Progress Notes (Signed)
Occupational Therapy Evaluation Patient Details Name: Patricia Wall MRN: [id number] DOB: 22-Nov-1950 Today's Date: 09/06/2019    History of Present Illness 69 y.o. female COPD with chronic hypoxic respiratory failure on 4 L of oxygen at home, breast cancer-s/p lumpectomy/radiation therapy, CAD, HTN, chronic diastolic heart failure who presented with acute on chronic hypoxic/hypercarbic respiratory failure in the setting of COPD exacerbation   Clinical Impression   Patient lives at home with daughter who assists with ADLs.  Patient uses w/c for mobility typically and is able to transfer self with supervision.  She uses 4L O2 at baseline and today on 4L with SpO2 86-90, which she states is normal for her.  Patient able to complete bed mobility with supervision and stand with min assist.  Can complete UB ADls with set up and LB ADLs with min assist (although max for dressing).  Patient ready for discharge home with assist from daughter.    Follow Up Recommendations  Supervision/Assistance - 24 hour;No OT follow up    Equipment Recommendations  None recommended by OT    Recommendations for Other Services       Precautions / Restrictions Precautions Precautions: Fall Precaution Comments: 4L [place] Restrictions Weight Bearing Restrictions: No      Mobility Bed Mobility Overal bed mobility: Needs Assistance Bed Mobility: Supine to Sit     Supine to sit: Supervision;HOB elevated        Transfers Overall transfer level: Needs assistance Equipment used: 1 person hand held assist Transfers: Sit to/from Stand Sit to Stand: Min assist              Balance Overall balance assessment: Needs assistance Sitting-balance support: No upper extremity supported;Feet supported Sitting balance-Leahy Scale: Good     Standing balance support: Single extremity supported Standing balance-Leahy Scale: Fair                             ADL either performed or assessed with  clinical judgement   ADL Overall ADL's : Needs assistance/impaired Eating/Feeding: Independent;Sitting   Grooming: Set up;Sitting   Upper Body Bathing: Sitting;Set up;Supervision/ safety   Lower Body Bathing: Sit to/from stand;Minimal assistance   Upper Body Dressing : Sitting;Set up;Supervision/safety   Lower Body Dressing: Maximal assistance;Sit to/from stand   Toilet Transfer: Minimal assistance;Stand-pivot           Functional mobility during ADLs: Minimal assistance (with transfer)       Vision         Perception     Praxis      Pertinent Vitals/Pain Pain Assessment: No/denies pain     Hand Dominance Right   Extremity/Trunk Assessment Upper Extremity Assessment Upper Extremity Assessment: Generalized weakness   Lower Extremity Assessment Lower Extremity Assessment: Defer to PT evaluation   Cervical / Trunk Assessment Cervical / Trunk Assessment: Kyphotic   Communication Communication Communication: No difficulties   Cognition Arousal/Alertness: Awake/alert Behavior During Therapy: WFL for tasks assessed/performed Overall Cognitive Status: Within Functional Limits for tasks assessed                                     General Comments  Patient on 4L O2 and SpO2 91 at rest.  SpO2 86 with mobility     Exercises     Shoulder Instructions      Home Living Family/patient expects to be discharged to:: Private  residence Living Arrangements: Children Available Help at Discharge: Family;Available 24 hours/day Type of Home: House Home Access: Stairs to enter [employer] of Steps: 2 Entrance Stairs-Rails: Right;Left Home Layout: One level     Bathroom Shower/Tub: Tub/shower unit         Home Equipment: Environmental consultant - 4 wheels;Bedside commode;Wheelchair - Chiropodist   Additional Comments: Getting hospital bed      Prior Functioning/Environment Level of Independence: Needs assistance  Gait /  Transfers Assistance Needed: pt requires intermittent assistance for transfers, community mobility in transport chair, and for stair negotiation ADL's / Homemaking Assistance Needed: Daughter assists with ADLs   Comments: Uses 4L O2 at baseline        OT Problem List: Decreased strength;Decreased activity tolerance;Impaired balance (sitting and/or standing);Cardiopulmonary status limiting activity      OT Treatment/Interventions: Self-care/ADL training;Therapeutic exercise;Energy conservation;Therapeutic activities;Balance training;Patient/family education    OT Goals(Current goals can be found in the care plan section) Acute Rehab OT Goals Patient Stated Goal: To go home OT Goal Formulation: With patient Time For Goal Achievement: 09/20/19 Potential to Achieve Goals: Good  OT Frequency: Min 2X/week   Barriers to D/C:            Co-evaluation              AM-PAC OT "6 Clicks" Daily Activity     Outcome Measure Help from another person eating meals?: None Help from another person taking care of personal grooming?: A Little Help from another person toileting, which includes using toliet, bedpan, or urinal?: A Little Help from another person bathing (including washing, rinsing, drying)?: A Little Help from another person to put on and taking off regular upper body clothing?: A Little Help from another person to put on and taking off regular lower body clothing?: A Little 6 Click Score: 19   End of Session Equipment Utilized During Treatment: Oxygen Nurse Communication: Mobility status  Activity Tolerance: Patient tolerated treatment well Patient left: in bed;with call bell/phone within reach;with bed alarm set  OT Visit Diagnosis: Unsteadiness on feet (R26.81);Muscle weakness (generalized) (M62.81)                Time: 9833-8250 OT Time Calculation (min): 12 min Charges:  OT General Charges $OT Visit: 1 Visit OT Evaluation $OT Eval Moderate Complexity: 1  [address] OTR/L   Adella Hare 09/06/2019, 3:48 PM

## 2019-09-06 NOTE — Discharge Summary (Signed)
PATIENT DETAILS Name: Patricia Wall Age: 69 y.o. Sex: female Date of Birth: [birth year] MRN: [id number]. Admitting Physician: Norval Morton, MD [id number], Shanon Brow, MD  Admit Date: 09/04/2019 Discharge date: 09/06/2019  Recommendations for Outpatient Follow-up:  1. Follow up with PCP in 1-2 weeks  Admitted From:  Home  Disposition: Home with home [place]: No  Equipment/Devices: None  Discharge Condition: Stable  CODE STATUS:  DNR  Diet recommendation:  Diet Order            Diet - low sodium heart healthy           Diet Carb Modified           Diet heart healthy/carb modified Room service appropriate? Yes; Fluid consistency: Thin  Diet effective now                  Brief Narrative: Patient is a 69 y.o. female COPD with chronic hypoxic respiratory failure on 4 L of oxygen at home, breast cancer-s/p lumpectomy/radiation therapy, CAD, HTN, chronic diastolic heart failure who presented with acute on chronic hypoxic/hypercarbic respiratory failure in the setting of COPD exacerbation.  She was placed on BiPAP and admitted to the hospitalist service.  See below for further details.  Significant events: 6/16>> admit-acute on chronic hypoxic/ hypercarbic respiratory failure due to to COPD exacerbation-BiPAP 6/17>> liberated off BiPAP overnight-back on 4 L of nasal cannula  Significant studies: 6/16>> chest x-ray: No acute cardiopulmonary finding.  Antimicrobial therapy: Zithromax: 6/16>>6/18  Microbiology data: None  Procedures : None  Consults: None  Brief Hospital Course: Acute on chronic hypercarbic and hypoxic respiratory failure: Due to COPD exacerbation.  Improved-initially required BiPAP-back on 4 L of oxygen.    COPD exacerbation.  Improved-no wheezing this morning-Taper steroids-continue usual bronchodilator regimen.  Has completed 3 days of Zithromax.  PAF: Maintaining sinus rhythm-rate controlled with  Cardizem-continue Eliquis.  CAD s/p last PCI February 2021: No anginal symptoms-continue Plavix, statin and beta-blocker.  HTN: BP controlled-continue Cardizem, metoprolol  Chronic diastolic heart failure: Compensated-continue furosemide.  DM-2: CBG stable-hold Metformin-continue SSI.  History of breast cancer: Continue letrozole-continue follow-up with oncology in the outpatient setting.  History of dementia: Mild-maintain delirium precautions-continue Aricept  Chronic debility: Per patient's daughter-patient mostly wheelchair-bound-but able to take a few steps-to go to the bathroom etc.    Palliative care: DNR in place-daughter already in the process of setting up outpatient hospice-requests that we see if we can accelerate/help in the process.  Seen by case management-home hospice set up-okay to discharge.  RN pressure injury documentation: Pressure Injury 04/22/19 Coccyx Mid Stage 1 -  Intact skin with non-blanchable redness of a localized area usually over a bony prominence. (Active)  04/22/19 1636  Location: Coccyx  Location Orientation: Mid  Staging: Stage 1 -  Intact skin with non-blanchable redness of a localized area usually over a bony prominence.  Wound Description (Comments):   Present on Admission: Yes    Discharge Diagnoses:  Principal Problem:   Acute on chronic respiratory failure with hypoxia and hypercapnia (HCC) Active Problems:   Essential hypertension   COPD exacerbation (HCC)   Normocytic anemia   PAF (paroxysmal atrial fibrillation) ([place])   DNR (do not resuscitate)   Discharge Instructions:  Activity:  As tolerated   Discharge Instructions    Call MD for:  difficulty breathing, headache or visual disturbances   Complete by: As directed    Diet - low sodium heart healthy   Complete by:  As directed    Diet Carb Modified   Complete by: As directed    Discharge instructions   Complete by: As directed    Follow with Primary MD  Bernerd Limbo, MD in 1-2 weeks  Please get a complete blood count and chemistry panel checked by your Primary MD at your next visit, and again as instructed by your Primary MD.  Get Medicines reviewed and adjusted: Please take all your medications with you for your next visit with your Primary MD  Laboratory/radiological data: Please request your Primary MD to go over all hospital tests and procedure/radiological results at the follow up, please ask your Primary MD to get all Hospital records sent to his/her office.  In some cases, they will be blood work, cultures and biopsy results pending at the time of your discharge. Please request that your primary care M.D. follows up on these results.  Also Note the following: If you experience worsening of your admission symptoms, develop shortness of breath, life threatening emergency, suicidal or homicidal thoughts you must seek medical attention immediately by calling 911 or calling your MD immediately  if symptoms less severe.  You must read complete instructions/literature along with all the possible adverse reactions/side effects for all the Medicines you take and that have been prescribed to you. Take any new Medicines after you have completely understood and accpet all the possible adverse reactions/side effects.   Do not drive when taking Pain medications or sleeping medications (Benzodaizepines)  Do not take more than prescribed Pain, Sleep and Anxiety Medications. It is not advisable to combine anxiety,sleep and pain medications without talking with your primary care practitioner  Special Instructions: If you have smoked or chewed Tobacco  in the last 2 yrs please stop smoking, stop any regular Alcohol  and or any Recreational drug use.  Wear Seat belts while driving.  Please note: You were cared for by a hospitalist during your hospital stay. Once you are discharged, your primary care physician will handle any further medical issues. Please  note that NO REFILLS for any discharge medications will be authorized once you are discharged, as it is imperative that you return to your primary care physician (or establish a relationship with a primary care physician if you do not have one) for your post hospital discharge needs so that they can reassess your need for medications and monitor your lab values.   Increase activity slowly   Complete by: As directed      Allergies as of 09/06/2019   No Known Allergies     Medication List    TAKE these medications   acetaminophen 325 MG tablet Commonly known as: TYLENOL Take 2 tablets (650 mg total) by mouth every 4 (four) hours as needed for headache or mild pain.   acidophilus Caps capsule Take 1 capsule by mouth 2 (two) times daily.   albuterol (2.5 MG/3ML) 0.083% nebulizer solution Commonly known as: PROVENTIL USE 1 VIAL IN NEBULIZER EVERY 4 HOURS FOR WHEEZING OR SHORTNESS OF BREATH. What changed:   how much to take  how to take this  when to take this  reasons to take this  additional instructions   albuterol 108 (90 Base) MCG/ACT inhaler Commonly known as: Ventolin HFA Inhale 2 puffs into the lungs every 6 (six) hours as needed for wheezing or shortness of breath. What changed: Another medication with the same name was changed. Make sure you understand how and when to take each.   apixaban 5 MG  Tabs tablet Commonly known as: ELIQUIS Take 1 tablet (5 mg total) by mouth 2 (two) times daily.   atorvastatin 80 MG tablet Commonly known as: LIPITOR Take 80 mg by mouth daily.   blood glucose meter kit and supplies Dispense based on patient and insurance preference. Use up to four times daily as directed. (FOR ICD-9 250.00, 250.01). What changed:   how much to take  how to take this  when to take this   budesonide-formoterol 160-4.5 MCG/ACT inhaler Commonly known as: SYMBICORT Inhale 2 puffs into the lungs 2 (two) times daily.   calcium carbonate 600 MG Tabs  tablet Commonly known as: OS-CAL Take 600 mg by mouth every morning.   clopidogrel 75 MG tablet Commonly known as: PLAVIX Take 1 tablet (75 mg total) by mouth daily with breakfast.   diltiazem 120 MG 24 hr capsule Commonly known as: CARDIZEM CD Take 120 mg by mouth daily.   donepezil 5 MG tablet Commonly known as: ARICEPT Take 5 mg by mouth daily.   DULoxetine 60 MG capsule Commonly known as: CYMBALTA Take 60 mg by mouth every morning.   Flutter Devi Use as directed What changed:   how much to take  how to take this  when to take this  additional instructions   freestyle lancets Use as instructed What changed:   how much to take  how to take this  when to take this  additional instructions   furosemide 40 MG tablet Commonly known as: LASIX Take 40 mg by mouth every other day.   gabapentin 300 MG capsule Commonly known as: NEURONTIN Take 300 mg by mouth 3 (three) times daily.   letrozole 2.5 MG tablet Commonly known as: FEMARA Take 2.5 mg by mouth every morning.   lidocaine 5 % Commonly known as: LIDODERM Place 1 patch onto the skin as needed.   metFORMIN 1000 MG tablet Commonly known as: GLUCOPHAGE Take 1,000 mg by mouth 2 (two) times daily.   methocarbamol 750 MG tablet Commonly known as: ROBAXIN Take 750 mg by mouth every 8 (eight) hours as needed for muscle spasms.   metoprolol succinate 25 MG 24 hr tablet Commonly known as: TOPROL-XL Take 25 mg by mouth daily.   morphine 20 MG/5ML solution Take 0.03 mg by mouth See admin instructions. Taking 0.87m every 2 hours as needed   multivitamin tablet Take 1 tablet by mouth daily.   nitroGLYCERIN 0.4 MG SL tablet Commonly known as: NITROSTAT Place 1 tablet (0.4 mg total) under the tongue every 5 (five) minutes as needed for chest pain (may repeat x3).   OXYGEN 1 each by Other route See admin instructions. Use 4L 24/7   pantoprazole 40 MG tablet Commonly known as: PROTONIX Take 40 mg by  mouth daily.   potassium chloride SA 20 MEQ tablet Commonly known as: Klor-Con M20 Take 1 tablet (20 mEq total) by mouth daily.   predniSONE 10 MG tablet Commonly known as: DELTASONE Take 4 tablets (40 mg) daily for 2 days, then, Take 3 tablets (30 mg) daily for 2 days, then, Take 2 tablets (20 mg) daily for 2 days, then, Take 1 tablets (10 mg) daily for 1 days, then stop   Tiotropium Bromide Monohydrate 2.5 MCG/ACT Aers Commonly known as: Spiriva Respimat Inhale 2 puffs into the lungs daily.   Vitamin D 50 MCG (2000 UT) tablet Take 2,000 Units by mouth every morning.       [facility].   Contact information: [address] [place], [place] [address]  Phone: [phone]             No Known Allergies    Other Procedures/Studies: DG Chest Portable 1 View  Result Date: 09/04/2019 CLINICAL DATA:  dyspnea EXAM: PORTABLE CHEST 1 VIEW COMPARISON:  Chest radiograph 06/16/2019 FINDINGS: The heart size and mediastinal contours are within normal limits. Lungs are hyperinflated. Chronic bilateral coarse interstitial markings. No new focal infiltrate. No pneumothorax or significant pleural effusion. The visualized skeletal structures are unremarkable. IMPRESSION: No acute cardiopulmonary finding. Electronically Signed   By: Audie Pinto M.D.   On: 09/04/2019 09:58     TODAY-DAY OF DISCHARGE:  Subjective:   Patricia Wall today has no headache,no chest abdominal pain,no new weakness tingling or numbness, feels much better wants to go home today.   Objective:   Blood pressure 109/63, pulse 79, temperature 98.7 F (37.1 C), temperature source Oral, resp. rate (!) 25, height 5' 6"  (1.676 m), weight 67.9 kg, SpO2 92 %.  Intake/Output Summary (Last 24 hours) at 09/06/2019 1117 Last data filed at 09/06/2019 0805 Gross per 24 hour  Intake 1936 ml  Output 1025 ml  Net 911 ml   Filed Weights    09/04/19 2116 09/05/19 0423 09/06/19 0609  Weight: 67.4 kg 67 kg 67.9 kg    Exam: Awake Alert, Oriented *3, No new F.N deficits, Normal affect [place].AT,PERRAL Supple Neck,No JVD, No cervical lymphadenopathy appriciated.  Symmetrical Chest wall movement, Good air movement bilaterally, CTAB RRR,No Gallops,Rubs or new Murmurs, No Parasternal Heave +ve B.Sounds, Abd Soft, Non tender, No organomegaly appriciated, No rebound -guarding or rigidity. No Cyanosis, Clubbing or edema, No new Rash or bruise   PERTINENT RADIOLOGIC STUDIES: No results found.   PERTINENT LAB RESULTS: CBC: Recent Labs    09/04/19 0935 09/04/19 0935 09/04/19 1509 09/05/19 0519  WBC 8.9  --   --  8.5  HGB 11.6*   < > 11.2* 10.2*  HCT 39.2   < > 33.0* 33.2*  PLT 204  --   --  172   < > = values in this interval not displayed.   CMET CMP     Component Value Date/Time   NA 141 09/05/2019 0519   NA 137 05/08/2019 1206   K 5.0 09/05/2019 0519   CL 98 09/05/2019 0519   CO2 34 (H) 09/05/2019 0519   GLUCOSE 163 (H) 09/05/2019 0519   BUN 21 09/05/2019 0519   BUN 25 05/08/2019 1206   CREATININE 0.81 09/05/2019 0519   CALCIUM 9.4 09/05/2019 0519   PROT 7.3 09/04/2019 0935   PROT 5.5 (L) 04/15/2019 1225   ALBUMIN 3.7 09/04/2019 0935   ALBUMIN 3.4 (L) 04/15/2019 1225   AST 24 09/04/2019 0935   ALT 20 09/04/2019 0935   ALKPHOS 60 09/04/2019 0935   BILITOT 0.6 09/04/2019 0935   BILITOT 0.4 04/15/2019 1225   GFRNONAA >60 09/05/2019 0519   GFRAA >60 09/05/2019 0519    GFR Estimated Creatinine Clearance: 59.6 mL/min (by C-G formula based on SCr of 0.81 mg/dL). No results for input(s): LIPASE, AMYLASE in the last 72 hours. No results for input(s): CKTOTAL, CKMB, CKMBINDEX, TROPONINI in the last 72 hours. Invalid input(s): POCBNP No results for input(s): DDIMER in the last 72 hours. No results for input(s): HGBA1C in the last 72 hours. No results for input(s): CHOL, HDL, LDLCALC, TRIG, CHOLHDL, LDLDIRECT in  the last 72 hours. No results for input(s):  TSH, T4TOTAL, T3FREE, THYROIDAB in the last 72 hours.  Invalid input(s): FREET3 No results for input(s): VITAMINB12, FOLATE, FERRITIN, TIBC, IRON, RETICCTPCT in the last 72 hours. Coags: No results for input(s): INR in the last 72 hours.  Invalid input(s): PT Microbiology: Recent Results (from the past 240 hour(s))  SARS Coronavirus 2 by RT PCR (hospital order, performed in [facility] hospital lab) Nasopharyngeal Nasopharyngeal Swab     Status: None   Collection Time: 09/04/19  9:38 AM   Specimen: Nasopharyngeal Swab  Result Value Ref Range Status   SARS Coronavirus 2 NEGATIVE NEGATIVE Final    Comment: (NOTE) SARS-CoV-2 target nucleic acids are NOT DETECTED.  The SARS-CoV-2 RNA is generally detectable in upper and lower respiratory specimens during the acute phase of infection. The lowest concentration of SARS-CoV-2 viral copies this assay can detect is 250 copies / mL. A negative result does not preclude SARS-CoV-2 infection and should not be used as the sole basis for treatment or other patient management decisions.  A negative result may occur with improper specimen collection / handling, submission of specimen other than nasopharyngeal swab, presence of viral mutation(s) within the areas targeted by this assay, and inadequate number of viral copies (<250 copies / mL). A negative result must be combined with clinical observations, patient history, and epidemiological information.  Fact Sheet for Patients:   StrictlyIdeas.no  Fact Sheet for Healthcare Providers: BankingDealers.co.za  This test is not yet approved or  cleared by the [place] FDA and has been authorized for detection and/or diagnosis of SARS-CoV-2 by FDA under an Emergency Use Authorization (EUA).  This EUA will remain in effect (meaning this test can be used) for the duration of the COVID-19 declaration under  Section 564(b)(1) of the Act, 21 U.S.C. section 360bbb-3(b)(1), unless the authorization is terminated or revoked sooner.  Performed at [facility], [place] [address], [place], [place] [address]   MRSA PCR Screening     Status: None   Collection Time: 09/06/19  5:26 AM   Specimen: Nasal Mucosa; Nasopharyngeal  Result Value Ref Range Status   MRSA by PCR NEGATIVE NEGATIVE Final    Comment:        The GeneXpert MRSA Assay (FDA approved for NASAL specimens only), is one component of a comprehensive MRSA colonization surveillance program. It is not intended to diagnose MRSA infection nor to guide or monitor treatment for MRSA infections. Performed at [facility], [place] [address] [place], [place] [address]     FURTHER DISCHARGE INSTRUCTIONS:  Get Medicines reviewed and adjusted: Please take all your medications with you for your next visit with your Primary MD  Laboratory/radiological data: Please request your Primary MD to go over all hospital tests and procedure/radiological results at the follow up, please ask your Primary MD to get all Hospital records sent to his/her office.  In some cases, they will be blood work, cultures and biopsy results pending at the time of your discharge. Please request that your primary care M.D. goes through all the records of your hospital data and follows up on these results.  Also Note the following: If you experience worsening of your admission symptoms, develop shortness of breath, life threatening emergency, suicidal or homicidal thoughts you must seek medical attention immediately by calling 911 or calling your MD immediately  if symptoms less severe.  You must read complete instructions/literature along with all the possible adverse reactions/side effects for all the Medicines you take and that have been  prescribed to you. Take any new Medicines after you have completely understood and accpet all the possible adverse reactions/side  effects.   Do not drive when taking Pain medications or sleeping medications (Benzodaizepines)  Do not take more than prescribed Pain, Sleep and Anxiety Medications. It is not advisable to combine anxiety,sleep and pain medications without talking with your primary care practitioner  Special Instructions: If you have smoked or chewed Tobacco  in the last 2 yrs please stop smoking, stop any regular Alcohol  and or any Recreational drug use.  Wear Seat belts while driving.  Please note: You were cared for by a hospitalist during your hospital stay. Once you are discharged, your primary care physician will handle any further medical issues. Please note that NO REFILLS for any discharge medications will be authorized once you are discharged, as it is imperative that you return to your primary care physician (or establish a relationship with a primary care physician if you do not have one) for your post hospital discharge needs so that they can reassess your need for medications and monitor your lab values.  Total Time spent coordinating discharge including counseling, education and face to face time equals 35 minutes.  SignedOren Binet 09/06/2019 11:17 AM

## 2019-09-06 NOTE — TOC Transition Note (Signed)
Transition of Care [facility]) - CM/SW Discharge Note   Patient Details  Name: Patricia Wall MRN: [id number] Date of Birth: 20-Feb-1951  [facility] CM/SW Contact:  Bess Kinds, RN Phone Number: [phone] 09/06/2019, 10:10 AM   Clinical Narrative:     Notified by Morrie Sheldon at [facility] that bed was being delivered this AM to the home. Chestine Spore is planning to admit patient to hospice services this afternoon. Notified MD to confirm plan for DC home with hospice services this AM. Spoke with patient's daughter, Leeroy Bock, who confirmed that bed has been delivered, and she will be coming to hospital to pick up patient this morning. [name] confirmed that she has travel oxygen tanks for patient to use while being transported. No further TOC needs identified.    Final next level of care: Home w Hospice Care Barriers to Discharge: No Barriers Identified   Patient Goals and CMS Choice Patient states their goals for this hospitalization and ongoing recovery are:: home with her daughter with hospice CMS Medicare.gov Compare Post Acute Care list provided to:: Patient Choice offered to / list presented to : Patient, Adult Children  Discharge Placement                       Discharge Plan and Services   Discharge Planning Services: CM Consult Post Acute Care Choice: Hospice          DME Arranged: N/A DME Agency: NA       HH Arranged: NA HH Agency: NA        Social Determinants of Health (SDOH) Interventions     Readmission Risk Interventions No flowsheet data found.

## 2019-09-06 NOTE — Progress Notes (Signed)
   09/06/19 1136  AVS Discharge Documentation  AVS Discharge Instructions Including Medications Provided to patient/caregiver  Name of Person Receiving AVS Discharge Instructions Including Medications Baruch Gouty  Name of Clinician That Reviewed AVS Discharge Instructions Including Medications Clarnce Flock, RN

## 2019-09-06 NOTE — Discharge Instructions (Signed)

## 2019-09-10 ENCOUNTER — Telehealth: Payer: Self-pay | Admitting: Internal Medicine

## 2019-09-10 MED ORDER — IPRATROPIUM-ALBUTEROL 0.5-2.5 (3) MG/3ML IN SOLN: 3.0000 mL | Freq: Four times a day (QID) | RESPIRATORY_TRACT | 11 refills | Status: AC

## 2019-09-10 NOTE — Telephone Encounter (Signed)
Called and spoke with pt letting her know the info stated by MW and she verbalized understanding. Sent rx to preferred pharmacy for pt. Nothing further needed.

## 2019-09-10 NOTE — Telephone Encounter (Signed)
I don't believe hospice will pay for the equivalent of symbicort in a neb so they are paying for her nebs the best we can do is duoneb qid (not prn)  The equivalent of symbicort is performist 20 mcg bid/ budesonide 25 mcg bid

## 2019-09-10 NOTE — Telephone Encounter (Signed)
Patient daughter called and message forwarded to Dr. Sherene Sires.  Dr. Sherene Sires, patient is now in hospice care.  Patient would like symbicort inhaler switched to nebulizer, please advise.

## 2019-09-20 ENCOUNTER — Inpatient Hospital Stay (HOSPITAL_COMMUNITY)
Admission: EM | Admit: 2019-09-20 | Discharge: 2019-09-23 | DRG: 190 | Disposition: A | Discharge status: Hospice | Payer: Medicare Other | Attending: Internal Medicine | Admitting: Internal Medicine

## 2019-09-20 ENCOUNTER — Other Ambulatory Visit: Payer: Self-pay

## 2019-09-20 ENCOUNTER — Emergency Department (HOSPITAL_COMMUNITY): Payer: Medicare Other

## 2019-09-20 DIAGNOSIS — J849 Interstitial pulmonary disease, unspecified: Secondary | ICD-10-CM | POA: Diagnosis present

## 2019-09-20 DIAGNOSIS — Z7984 Long term (current) use of oral hypoglycemic drugs: Secondary | ICD-10-CM | POA: Diagnosis not present

## 2019-09-20 DIAGNOSIS — J9621 Acute and chronic respiratory failure with hypoxia: Secondary | ICD-10-CM | POA: Diagnosis present

## 2019-09-20 DIAGNOSIS — Z993 Dependence on wheelchair: Secondary | ICD-10-CM | POA: Diagnosis not present

## 2019-09-20 DIAGNOSIS — Z20822 Contact with and (suspected) exposure to covid-19: Secondary | ICD-10-CM | POA: Diagnosis present

## 2019-09-20 DIAGNOSIS — I5032 Chronic diastolic (congestive) heart failure: Secondary | ICD-10-CM | POA: Diagnosis present

## 2019-09-20 DIAGNOSIS — J9622 Acute and chronic respiratory failure with hypercapnia: Secondary | ICD-10-CM | POA: Diagnosis present

## 2019-09-20 DIAGNOSIS — I480 Paroxysmal atrial fibrillation: Secondary | ICD-10-CM | POA: Diagnosis present

## 2019-09-20 DIAGNOSIS — J441 Chronic obstructive pulmonary disease with (acute) exacerbation: Secondary | ICD-10-CM | POA: Diagnosis present

## 2019-09-20 DIAGNOSIS — Z8673 Personal history of transient ischemic attack (TIA), and cerebral infarction without residual deficits: Secondary | ICD-10-CM | POA: Diagnosis not present

## 2019-09-20 DIAGNOSIS — Z923 Personal history of irradiation: Secondary | ICD-10-CM

## 2019-09-20 DIAGNOSIS — E1165 Type 2 diabetes mellitus with hyperglycemia: Secondary | ICD-10-CM | POA: Diagnosis present

## 2019-09-20 DIAGNOSIS — Z955 Presence of coronary angioplasty implant and graft: Secondary | ICD-10-CM

## 2019-09-20 DIAGNOSIS — Z7902 Long term (current) use of antithrombotics/antiplatelets: Secondary | ICD-10-CM | POA: Diagnosis not present

## 2019-09-20 DIAGNOSIS — I110 Hypertensive heart disease with heart failure: Secondary | ICD-10-CM | POA: Diagnosis present

## 2019-09-20 DIAGNOSIS — Z79811 Long term (current) use of aromatase inhibitors: Secondary | ICD-10-CM | POA: Diagnosis not present

## 2019-09-20 DIAGNOSIS — Z66 Do not resuscitate: Secondary | ICD-10-CM | POA: Diagnosis present

## 2019-09-20 DIAGNOSIS — Z79899 Other long term (current) drug therapy: Secondary | ICD-10-CM | POA: Diagnosis not present

## 2019-09-20 DIAGNOSIS — Z7901 Long term (current) use of anticoagulants: Secondary | ICD-10-CM | POA: Diagnosis not present

## 2019-09-20 DIAGNOSIS — D638 Anemia in other chronic diseases classified elsewhere: Secondary | ICD-10-CM | POA: Diagnosis present

## 2019-09-20 DIAGNOSIS — K219 Gastro-esophageal reflux disease without esophagitis: Secondary | ICD-10-CM | POA: Diagnosis present

## 2019-09-20 DIAGNOSIS — Z87891 Personal history of nicotine dependence: Secondary | ICD-10-CM

## 2019-09-20 DIAGNOSIS — I10 Essential (primary) hypertension: Secondary | ICD-10-CM

## 2019-09-20 DIAGNOSIS — Z9049 Acquired absence of other specified parts of digestive tract: Secondary | ICD-10-CM | POA: Diagnosis not present

## 2019-09-20 DIAGNOSIS — Z853 Personal history of malignant neoplasm of breast: Secondary | ICD-10-CM | POA: Diagnosis not present

## 2019-09-20 DIAGNOSIS — I2510 Atherosclerotic heart disease of native coronary artery without angina pectoris: Secondary | ICD-10-CM | POA: Diagnosis present

## 2019-09-20 DIAGNOSIS — Z90710 Acquired absence of both cervix and uterus: Secondary | ICD-10-CM

## 2019-09-20 DIAGNOSIS — Z515 Encounter for palliative care: Secondary | ICD-10-CM | POA: Diagnosis present

## 2019-09-20 DIAGNOSIS — E1151 Type 2 diabetes mellitus with diabetic peripheral angiopathy without gangrene: Secondary | ICD-10-CM | POA: Diagnosis present

## 2019-09-20 DIAGNOSIS — T380X5A Adverse effect of glucocorticoids and synthetic analogues, initial encounter: Secondary | ICD-10-CM | POA: Diagnosis present

## 2019-09-20 DIAGNOSIS — I361 Nonrheumatic tricuspid (valve) insufficiency: Secondary | ICD-10-CM | POA: Diagnosis not present

## 2019-09-20 DIAGNOSIS — D72829 Elevated white blood cell count, unspecified: Secondary | ICD-10-CM | POA: Diagnosis present

## 2019-09-20 DIAGNOSIS — F0390 Unspecified dementia without behavioral disturbance: Secondary | ICD-10-CM | POA: Diagnosis present

## 2019-09-20 LAB — COMPREHENSIVE METABOLIC PANEL
ALT: 20 U/L (ref 0–44)
AST: 19 U/L (ref 15–41)
Albumin: 3.6 g/dL (ref 3.5–5.0)
Alkaline Phosphatase: 76 U/L (ref 38–126)
Anion gap: 7 (ref 5–15)
BUN: 12 mg/dL (ref 8–23)
CO2: 36 mmol/L — ABNORMAL HIGH (ref 22–32)
Calcium: 9.2 mg/dL (ref 8.9–10.3)
Chloride: 94 mmol/L — ABNORMAL LOW (ref 98–111)
Creatinine, Ser: 0.74 mg/dL (ref 0.44–1.00)
GFR calc Af Amer: >60 mL/min (ref >60)
GFR calc non Af Amer: >60 mL/min (ref >60)
Glucose, Bld: 205 mg/dL — ABNORMAL HIGH (ref 70–99)
Potassium: 4.4 mmol/L (ref 3.5–5.1)
Sodium: 137 mmol/L (ref 135–145)
Total Bilirubin: 0.3 mg/dL (ref 0.3–1.2)
Total Protein: 6.7 g/dL (ref 6.5–8.1)

## 2019-09-20 LAB — GLUCOSE, CAPILLARY
Glucose-Capillary: 211 mg/dL — ABNORMAL HIGH (ref 70–99)
Glucose-Capillary: 254 mg/dL — ABNORMAL HIGH (ref 70–99)

## 2019-09-20 LAB — I-STAT ARTERIAL BLOOD GAS, ED
Acid-Base Excess: 9 mmol/L — ABNORMAL HIGH (ref 0.0–2.0)
Acid-Base Excess: 9 mmol/L — ABNORMAL HIGH (ref 0.0–2.0)
Bicarbonate: 40 mmol/L — ABNORMAL HIGH (ref 20.0–28.0)
Bicarbonate: 37 mmol/L — ABNORMAL HIGH (ref 20.0–28.0)
Calcium, Ion: 1.28 mmol/L (ref 1.15–1.40)
Calcium, Ion: 1.21 mmol/L (ref 1.15–1.40)
HCT: 34 % — ABNORMAL LOW (ref 36.0–46.0)
HCT: 34 % — ABNORMAL LOW (ref 36.0–46.0)
Hemoglobin: 11.6 g/dL — ABNORMAL LOW (ref 12.0–15.0)
Hemoglobin: 11.6 g/dL — ABNORMAL LOW (ref 12.0–15.0)
O2 Saturation: 96 %
O2 Saturation: 92 %
Patient temperature: 98.6
Patient temperature: 98.6
Potassium: 4.2 mmol/L (ref 3.5–5.1)
Potassium: 4 mmol/L (ref 3.5–5.1)
Sodium: 137 mmol/L (ref 135–145)
Sodium: 138 mmol/L (ref 135–145)
TCO2: 43 mmol/L — ABNORMAL HIGH (ref 22–32)
TCO2: 39 mmol/L — ABNORMAL HIGH (ref 22–32)
pCO2 arterial: 94.3 mmHg (ref 32.0–48.0)
pCO2 arterial: 65.8 mmHg (ref 32.0–48.0)
pH, Arterial: 7.235 — ABNORMAL LOW (ref 7.350–7.450)
pH, Arterial: 7.358 (ref 7.350–7.450)
pO2, Arterial: 105 mmHg (ref 83.0–108.0)
pO2, Arterial: 69 mmHg — ABNORMAL LOW (ref 83.0–108.0)

## 2019-09-20 LAB — TROPONIN I (HIGH SENSITIVITY)
Troponin I (High Sensitivity): 10 ng/L (ref <18)
Troponin I (High Sensitivity): 11 ng/L (ref <18)

## 2019-09-20 LAB — CBC WITH DIFFERENTIAL/PLATELET
Abs Immature Granulocytes: 0.04 10*3/uL (ref 0.00–0.07)
Basophils Absolute: 0 10*3/uL (ref 0.0–0.1)
Basophils Relative: 0 %
Eosinophils Absolute: 0.3 10*3/uL (ref 0.0–0.5)
Eosinophils Relative: 2 %
HCT: 37.3 % (ref 36.0–46.0)
Hemoglobin: 10.7 g/dL — ABNORMAL LOW (ref 12.0–15.0)
Immature Granulocytes: 0 %
Lymphocytes Relative: 15 %
Lymphs Abs: 1.7 10*3/uL (ref 0.7–4.0)
MCH: 27 pg (ref 26.0–34.0)
MCHC: 28.7 g/dL — ABNORMAL LOW (ref 30.0–36.0)
MCV: 94 fL (ref 80.0–100.0)
Monocytes Absolute: 1.1 10*3/uL — ABNORMAL HIGH (ref 0.1–1.0)
Monocytes Relative: 10 %
Neutro Abs: 7.9 10*3/uL — ABNORMAL HIGH (ref 1.7–7.7)
Neutrophils Relative %: 73 %
Platelets: 155 10*3/uL (ref 150–400)
RBC: 3.97 MIL/uL (ref 3.87–5.11)
RDW: 15.2 % (ref 11.5–15.5)
WBC: 11 10*3/uL — ABNORMAL HIGH (ref 4.0–10.5)
nRBC: 0 % (ref 0.0–0.2)

## 2019-09-20 LAB — CBG MONITORING, ED
Glucose-Capillary: 214 mg/dL — ABNORMAL HIGH (ref 70–99)
Glucose-Capillary: 244 mg/dL — ABNORMAL HIGH (ref 70–99)

## 2019-09-20 LAB — AMMONIA: Ammonia: 16 umol/L (ref 9–35)

## 2019-09-20 LAB — LIPASE, BLOOD: Lipase: 26 U/L (ref 11–51)

## 2019-09-20 LAB — LACTIC ACID, PLASMA
Lactic Acid, Venous: 1.2 mmol/L (ref 0.5–1.9)
Lactic Acid, Venous: 2.6 mmol/L (ref 0.5–1.9)

## 2019-09-20 LAB — ETHANOL: Alcohol, Ethyl (B): <10 mg/dL (ref <10)

## 2019-09-20 LAB — HEMOGLOBIN A1C
Hgb A1c MFr Bld: 7.4 % — ABNORMAL HIGH (ref 4.8–5.6)
Mean Plasma Glucose: 165.68 mg/dL

## 2019-09-20 LAB — SARS CORONAVIRUS 2 BY RT PCR (HOSPITAL ORDER, PERFORMED IN ~~LOC~~ HOSPITAL LAB): SARS Coronavirus 2: NEGATIVE

## 2019-09-20 LAB — BRAIN NATRIURETIC PEPTIDE: B Natriuretic Peptide: 59.9 pg/mL (ref 0.0–100.0)

## 2019-09-20 MED ORDER — APIXABAN 5 MG PO TABS
5.0000 mg | ORAL_TABLET | Freq: Two times a day (BID) | ORAL | Status: DC
  Administered 2019-09-20 – 2019-09-23 (×6): 5 mg via ORAL
  Filled 2019-09-20 (×8): qty 1

## 2019-09-20 MED ORDER — ACETAMINOPHEN 650 MG RE SUPP: 650.0000 mg | Freq: Four times a day (QID) | RECTAL | Status: DC | PRN

## 2019-09-20 MED ORDER — METHOCARBAMOL 500 MG PO TABS
750.0000 mg | ORAL_TABLET | Freq: Three times a day (TID) | ORAL | Status: DC | PRN
  Administered 2019-09-21: 750 mg via ORAL
  Filled 2019-09-20 (×2): qty 2

## 2019-09-20 MED ORDER — SODIUM CHLORIDE 0.9% FLUSH
3.0000 mL | Freq: Two times a day (BID) | INTRAVENOUS | Status: DC
  Administered 2019-09-20 – 2019-09-23 (×7): 3 mL via INTRAVENOUS

## 2019-09-20 MED ORDER — LORAZEPAM 2 MG/ML IJ SOLN
0.5000 mg | Freq: Once | INTRAMUSCULAR | Status: AC
  Administered 2019-09-20: 0.5 mg via INTRAVENOUS
  Filled 2019-09-20: qty 1

## 2019-09-20 MED ORDER — IOHEXOL 300 MG/ML  SOLN
100.0000 mL | Freq: Once | INTRAMUSCULAR | Status: AC | PRN
  Administered 2019-09-20: 100 mL via INTRAVENOUS

## 2019-09-20 MED ORDER — ONDANSETRON HCL 4 MG/2ML IJ SOLN: 4.0000 mg | Freq: Four times a day (QID) | INTRAMUSCULAR | Status: DC | PRN

## 2019-09-20 MED ORDER — DONEPEZIL HCL 5 MG PO TABS
5.0000 mg | ORAL_TABLET | Freq: Every day | ORAL | Status: DC
  Administered 2019-09-21 – 2019-09-23 (×3): 5 mg via ORAL
  Filled 2019-09-20 (×3): qty 1

## 2019-09-20 MED ORDER — SODIUM CHLORIDE 0.9 % IV BOLUS
500.0000 mL | Freq: Once | INTRAVENOUS | Status: AC
  Administered 2019-09-20: 500 mL via INTRAVENOUS

## 2019-09-20 MED ORDER — FUROSEMIDE 40 MG PO TABS
40.0000 mg | ORAL_TABLET | ORAL | Status: DC
  Administered 2019-09-22: 40 mg via ORAL
  Filled 2019-09-20 (×3): qty 1

## 2019-09-20 MED ORDER — ONDANSETRON HCL 4 MG PO TABS: 4.0000 mg | ORAL_TABLET | Freq: Four times a day (QID) | ORAL | Status: DC | PRN

## 2019-09-20 MED ORDER — LETROZOLE 2.5 MG PO TABS
2.5000 mg | ORAL_TABLET | ORAL | Status: DC
  Administered 2019-09-21 – 2019-09-23 (×3): 2.5 mg via ORAL
  Filled 2019-09-20 (×3): qty 1

## 2019-09-20 MED ORDER — DILTIAZEM HCL ER COATED BEADS 120 MG PO CP24
120.0000 mg | ORAL_CAPSULE | Freq: Every day | ORAL | Status: DC
  Administered 2019-09-21 – 2019-09-23 (×3): 120 mg via ORAL
  Filled 2019-09-20 (×3): qty 1

## 2019-09-20 MED ORDER — ACETAMINOPHEN 325 MG PO TABS
650.0000 mg | ORAL_TABLET | Freq: Four times a day (QID) | ORAL | Status: DC | PRN
  Administered 2019-09-21: 650 mg via ORAL
  Filled 2019-09-20: qty 2

## 2019-09-20 MED ORDER — PANTOPRAZOLE SODIUM 40 MG PO TBEC
40.0000 mg | DELAYED_RELEASE_TABLET | Freq: Every day | ORAL | Status: DC
  Administered 2019-09-21 – 2019-09-23 (×3): 40 mg via ORAL
  Filled 2019-09-20 (×3): qty 1

## 2019-09-20 MED ORDER — ALBUTEROL SULFATE HFA 108 (90 BASE) MCG/ACT IN AERS: 8.0000 | INHALATION_SPRAY | Freq: Once | RESPIRATORY_TRACT | Status: DC

## 2019-09-20 MED ORDER — METHYLPREDNISOLONE SODIUM SUCC 125 MG IJ SOLR: 60.0000 mg | Freq: Three times a day (TID) | INTRAMUSCULAR | Status: DC

## 2019-09-20 MED ORDER — METHYLPREDNISOLONE SODIUM SUCC 125 MG IJ SOLR
60.0000 mg | Freq: Three times a day (TID) | INTRAMUSCULAR | Status: DC
  Administered 2019-09-20 – 2019-09-22 (×5): 60 mg via INTRAVENOUS
  Filled 2019-09-20 (×6): qty 2

## 2019-09-20 MED ORDER — INSULIN ASPART 100 UNIT/ML ~~LOC~~ SOLN
0.0000 [IU] | Freq: Three times a day (TID) | SUBCUTANEOUS | Status: DC
  Administered 2019-09-21: 3 [IU] via SUBCUTANEOUS
  Administered 2019-09-21: 5 [IU] via SUBCUTANEOUS
  Administered 2019-09-21: 2 [IU] via SUBCUTANEOUS
  Administered 2019-09-22: 7 [IU] via SUBCUTANEOUS

## 2019-09-20 MED ORDER — IPRATROPIUM-ALBUTEROL 0.5-2.5 (3) MG/3ML IN SOLN
3.0000 mL | Freq: Four times a day (QID) | RESPIRATORY_TRACT | Status: DC
  Administered 2019-09-20 – 2019-09-23 (×14): 3 mL via RESPIRATORY_TRACT
  Filled 2019-09-20 (×15): qty 3

## 2019-09-20 MED ORDER — LIDOCAINE 5 % EX PTCH: 1.0000 | MEDICATED_PATCH | CUTANEOUS | Status: DC | PRN

## 2019-09-20 MED ORDER — METHYLPREDNISOLONE SODIUM SUCC 125 MG IJ SOLR
125.0000 mg | Freq: Once | INTRAMUSCULAR | Status: AC
  Administered 2019-09-20: 125 mg via INTRAVENOUS
  Filled 2019-09-20: qty 2

## 2019-09-20 MED ORDER — ALBUTEROL (5 MG/ML) CONTINUOUS INHALATION SOLN
10.0000 mg/h | INHALATION_SOLUTION | Freq: Once | RESPIRATORY_TRACT | Status: AC
  Administered 2019-09-20: 10 mg/h via RESPIRATORY_TRACT
  Filled 2019-09-20: qty 20

## 2019-09-20 MED ORDER — RISAQUAD PO CAPS
1.0000 | ORAL_CAPSULE | Freq: Two times a day (BID) | ORAL | Status: DC
  Administered 2019-09-20 – 2019-09-23 (×6): 1 via ORAL
  Filled 2019-09-20 (×6): qty 1

## 2019-09-20 MED ORDER — INSULIN ASPART 100 UNIT/ML ~~LOC~~ SOLN
0.0000 [IU] | SUBCUTANEOUS | Status: DC
  Administered 2019-09-20 (×2): 2 [IU] via SUBCUTANEOUS

## 2019-09-20 MED ORDER — METOPROLOL SUCCINATE ER 25 MG PO TB24
25.0000 mg | ORAL_TABLET | Freq: Every day | ORAL | Status: DC
  Administered 2019-09-21 – 2019-09-23 (×3): 25 mg via ORAL
  Filled 2019-09-20 (×3): qty 1

## 2019-09-20 MED ORDER — GABAPENTIN 300 MG PO CAPS
300.0000 mg | ORAL_CAPSULE | Freq: Three times a day (TID) | ORAL | Status: DC
  Administered 2019-09-20 – 2019-09-23 (×10): 300 mg via ORAL
  Filled 2019-09-20 (×10): qty 1

## 2019-09-20 MED ORDER — ATORVASTATIN CALCIUM 80 MG PO TABS
80.0000 mg | ORAL_TABLET | Freq: Every day | ORAL | Status: DC
  Administered 2019-09-20 – 2019-09-23 (×3): 80 mg via ORAL
  Filled 2019-09-20 (×4): qty 1

## 2019-09-20 MED ORDER — CLOPIDOGREL BISULFATE 75 MG PO TABS
75.0000 mg | ORAL_TABLET | Freq: Every day | ORAL | Status: DC
  Administered 2019-09-21 – 2019-09-23 (×3): 75 mg via ORAL
  Filled 2019-09-20 (×3): qty 1

## 2019-09-20 NOTE — Progress Notes (Signed)
Transported patient to and from CT Scan on BIPAP.  Tolerated well.

## 2019-09-20 NOTE — ED Notes (Signed)
Called main lab to check on results for Troponin and both lactic acid blood draws. "will check on it"

## 2019-09-20 NOTE — Progress Notes (Signed)
ED MD notified of ABG results, bipap settings adjusted.

## 2019-09-20 NOTE — ED Provider Notes (Signed)
[facility] DEPARTMENT Provider Note   CSN: [id number] Arrival date & time: 09/20/19  3790  LEVEL 5 CAVEAT - ALTERED MENTAL STATUS  History Chief Complaint  Patient presents with  . COPD    Patricia Wall is a 69 y.o. female.  HPI 69 year old female brought in by EMS for shortness of breath.  History is limited as the patient is confused.  It is unknown at this time what her baseline is.  EMS had her on a nonrebreather and the patient endorses feeling short of breath.  She is also endorsing some lower abdominal pain.  Further history is quite limited in the first thing she is talking out to me is that someone is chasing her but she cannot tell me who. The nurse who got report from EMS reports that she took morphine and ativan this morning, but it is unknown how much.  Further history later obtained over the phone by the daughter.  The patient apparently has been short of breath more than typical over the last 1-1/2 days.  No significant cough.  Has also been confused and anxious which is typical whenever she has too much CO2.  Patient is in hospice and family would like treatment up to noninvasive ventilation but no intubation and no CPR. She has some memory issues at baseline but otherwise is not typically altered.  Typically requires medicine to help her with bipap.   Past Medical History:  Diagnosis Date  . Abnormal chest xray   . ABUSE, ALCOHOL, UNSPECIFIED 09/04/2006   Qualifier: Diagnosis of  By: Amil Amen MD, Benjamine Mola    . Acute diastolic CHF (congestive heart failure) ([place])   . Acute on chronic respiratory failure with hypercapnia ([place])   . Acute respiratory failure with hypoxia ([place]) 01/07/2015  . Alcohol abuse   . Angina, class III ([place]) 03/29/2007   Qualifier: Diagnosis of  By: Amil Amen MD, Benjamine Mola    . ARF (acute renal failure) ([place]) 04/27/2019  . Back pain   . CAD (coronary artery disease)     Prev seen by [facility].  Stent to OM and LAD Last cath  6/11 cathed on June 13, [name]. 2011.  The cardiac catheterization showed 30% and 50% lesions in the mid  and distal LAD, 50% in the OM1, 40% in the OM2 with 20% in-stent  restenosis, 40% in-stent restenosis in the circumflex and no significant  disease in the RCA.  Her EF was normal.  Dr. Olevia Perches recommended a trial  of proton pump inhibitors   . CAD S/P percutaneous coronary angioplasty: Prior Cypher DES to pLAD, pOM2 & AVG Cx (after Om2); New DES to AVG Cx ISR & Angiosculpt PTCA of Ostial AVG Cx from OM2. 09/04/2006   Qualifier: Diagnosis of  By: Amil Amen MD, Benjamine Mola    . Chest pain   . COPD (chronic obstructive pulmonary disease) ([place])   . COPD GOLD II / still smoking  09/04/2006   Followed in Pulmonary clinic/ [facility]/ [place]  Active smoker  - pft's 12/12/12 FEV1  1.41 (53%) ratio 54 with no significant reversibility and DLCO 47% corrects to 50 01/16/2013  Walked RA x 2 laps @ 185 ft each stopped due to sob, no desat     - Trial of anoro 01/16/2013 > helped but insurance would not pay - incruse one daily started 10/02/2013 >>>not covered  -Spiriva restarted 10/15/13 >  . Cyanosis   . DDD (degenerative disc disease)   . DEGENERATIVE DISC DISEASE, CERVICAL SPINE  09/04/2006   Annotation: c6-7 on the right Qualifier: Diagnosis of  By: Amil Amen MD, Benjamine Mola    . Depression   . Diarrhea   . Diverticular disease   . DIVERTICULOSIS, COLON 09/04/2006   Qualifier: Diagnosis of  By: Amil Amen MD, Benjamine Mola    . GERD (gastroesophageal reflux disease)   . H/O: hysterectomy   . Hemorrhoids   . HTN (hypertension)   . IBS (irritable bowel syndrome)   . Insomnia   . Irritable bowel syndrome 09/04/2006   Qualifier: Diagnosis of  By: Amil Amen MD, Benjamine Mola    . Lymphadenitis   . PEPTIC ULCER DISEASE 09/04/2006   Annotation: distant history of  Qualifier: Diagnosis of  By: Amil Amen MD, Benjamine Mola    . Pharyngitis   . PHARYNGITIS 07/18/2007   Qualifier: Diagnosis of  By: Radene Ou MD, [place]    . PUD  (peptic ulcer disease)   . PVD (peripheral vascular disease) ([place])   . Sinusitis   . TIA (transient ischemic attack)   . Tobacco abuse   . Vertigo     Patient Active Problem List   Diagnosis Date Noted  . Acute on chronic respiratory failure with hypoxia and hypercapnia ([place]) 09/04/2019  . DNR (do not resuscitate) 09/04/2019  . ARF (acute renal failure) ([place]) 04/27/2019  . Hypotension 04/27/2019  . Diabetes mellitus type 2 in nonobese ([place]) 04/27/2019  . Pressure injury of skin 04/23/2019  . Acute on chronic respiratory failure with hypercapnia ([place])   . SOB (shortness of breath)   . Acute diastolic CHF (congestive heart failure) ([place])   . Exertional angina ([facility])   . Atrial fibrillation with RVR ([place])   . Normocytic anemia 04/18/2019  . Thrombocytopenia ([place]) 04/18/2019  . PAF (paroxysmal atrial fibrillation) ([place]) 04/18/2019  . Chronic respiratory failure with hypoxia and hypercapnia ([place]) 03/29/2018  . Acute respiratory failure with hypoxia ([place]) 01/07/2015  . CAD (coronary artery disease) 01/07/2015  . Obesity 11/01/2014  . COPD exacerbation ([place]) 12/30/2013  . Chest pain 12/03/2013  . Atherosclerotic heart disease of native coronary artery with angina pectoris ([place]) 12/03/2013    Class: Diagnosis of  . Syncope 12/02/2013  . Dyspnea 10/02/2013  . Acute upper respiratory infections of unspecified site 04/03/2013  . Right carotid bruit 11/15/2012  . Peripheral vascular disease ([place]) 09/30/2009  . Nonspecific (abnormal) findings on radiological and other examination of body structure 09/02/2009  . ABNORMAL CHEST XRAY 09/02/2009  . TINNITUS 04/15/2008  . COUGH 04/15/2008  . VERTIGO 02/04/2008  . PHARYNGITIS 07/18/2007  . LYMPHADENITIS, CERVICAL 07/18/2007  . Angina, class III ([place]) 03/29/2007  . CONDYLOMA ACUMINATUM 03/01/2007  . SINUSITIS, ACUTE 03/01/2007  . INSOMNIA 01/30/2007  . Elevated lipids 09/04/2006  . ABUSE, ALCOHOL, UNSPECIFIED 09/04/2006  . Cigarette  smoker 09/04/2006  . DEPRESSION 09/04/2006  . Essential hypertension 09/04/2006  . CAD S/P percutaneous coronary angioplasty: Prior Cypher DES to pLAD, pOM2 & AVG Cx (after Om2); New DES to AVG Cx ISR & Angiosculpt PTCA of Ostial AVG Cx from OM2. 09/04/2006  . HEMORRHOIDS 09/04/2006  . COPD GOLD II / still smoking  09/04/2006  . GERD 09/04/2006  . PEPTIC ULCER DISEASE 09/04/2006  . DIVERTICULOSIS, COLON 09/04/2006  . Irritable bowel syndrome 09/04/2006  . DEGENERATIVE DISC DISEASE, CERVICAL SPINE 09/04/2006  . LOW BACK PAIN 09/04/2006  . HEADACHE 09/04/2006  . History of cardiovascular disorder 09/04/2006  . ONYCHOMYCOSIS, TOENAILS 09/01/2006  . DIARRHEA 02/18/2006    Past Surgical History:  Procedure Laterality Date  . ABDOMINAL HYSTERECTOMY    .  APPENDECTOMY    . BREAST LUMPECTOMY Left 2015  . CARDIAC CATHETERIZATION  2005, 2006, 2009,2011  . CHOLECYSTECTOMY    . CORONARY STENT INTERVENTION N/A 04/22/2019   Procedure: CORONARY STENT INTERVENTION;  Surgeon: Troy Sine, MD;  Location: [facility];  Service: Cardiovascular;  Laterality: N/A;  . CORONARY STENT PLACEMENT     Status post balloon angiogram plasty and Cypher stent to the distal    circumflex and OM2 branch  . ESOPHAGOGASTRODUODENOSCOPY (EGD) WITH PROPOFOL Left 04/21/2019   Procedure: ESOPHAGOGASTRODUODENOSCOPY (EGD) WITH PROPOFOL;  Surgeon: Arta Silence, MD;  Location: [facility];  Service: Endoscopy;  Laterality: Left;  . Hammertoe repair    . Left Bunionectomy    . LEFT HEART CATH AND CORONARY ANGIOGRAPHY N/A 04/22/2019   Procedure: LEFT HEART CATH AND CORONARY ANGIOGRAPHY;  Surgeon: Troy Sine, MD;  Location: [place] [facility];  Service: Cardiovascular;  Laterality: N/A;  . LEFT HEART CATHETERIZATION WITH CORONARY ANGIOGRAM N/A 12/03/2013   Procedure: LEFT HEART CATHETERIZATION WITH CORONARY ANGIOGRAM;  Surgeon: Leonie Man, MD;  Location[facility] C[facility];  Service: Cardiovascular;  Laterality: N/A;    . TUBAL LIGATION       OB History   No obstetric history on file.     Family History  Problem Relation Age of Onset  . Heart disease Mother   . Rheumatologic disease Mother   . Cancer - Other Mother        Uterine  . Emphysema Father   . Heart disease Father   . Cancer - Other Father   . Coronary artery disease Other   . Asthma Sister        2 sisters  . Stroke Sister     Social History   Tobacco Use  . Smoking status: Former Smoker    Packs/day: 0.25    Years: 59.00    Pack years: 14.75    Types: Cigarettes  . Smokeless tobacco: Never Used  Substance Use Topics  . Alcohol use: No    Alcohol/week: 0.0 standard drinks  . Drug use: Yes    Comment: Seldom Marijuana use- 1Xmonth    Home Medications Prior to Admission medications   Medication Sig Start Date End Date Taking? Authorizing Provider  acetaminophen (TYLENOL) 325 MG tablet Take 2 tablets (650 mg total) by mouth every 4 (four) hours as needed for headache or mild pain. 04/23/19  Yes Sheikh, Omair Latif, DO  acidophilus (RISAQUAD) CAPS capsule Take 1 capsule by mouth 2 (two) times daily. 04/09/19  Yes [provider]  albuterol (PROVENTIL) (2.5 MG/3ML) 0.083% nebulizer solution USE 1 VIAL IN NEBULIZER EVERY 4 HOURS FOR WHEEZING OR SHORTNESS OF BREATH. Patient taking differently: Take 2.5 mg by nebulization every 4 (four) hours as needed for wheezing or shortness of breath.  04/29/19  Yes Tanda Rockers, MD  albuterol (VENTOLIN HFA) 108 (90 Base) MCG/ACT inhaler Inhale 2 puffs into the lungs every 6 (six) hours as needed for wheezing or shortness of breath. 07/22/19  Yes Martyn Ehrich, NP  apixaban (ELIQUIS) 5 MG TABS tablet Take 1 tablet (5 mg total) by mouth 2 (two) times daily. 05/30/19  Yes Josue Hector, MD  atorvastatin (LIPITOR) 80 MG tablet Take 80 mg by mouth daily.   Yes [provider]  blood glucose meter kit and supplies Dispense based on patient and insurance preference. Use up to  four times daily as directed. (FOR ICD-9 250.00, 250.01). Patient taking differently: 1 each  by Other route See admin instructions. Dispense based on patient and insurance preference. Use up to four times daily as directed. (FOR ICD-9 250.00, 250.01). 01/13/15  Yes Reyne Dumas, MD  calcium carbonate (OS-CAL) 600 MG TABS Take 600 mg by mouth every morning.    Yes [provider]  Cholecalciferol (VITAMIN D) 2000 UNITS tablet Take 2,000 Units by mouth every morning.    Yes [provider]  clopidogrel (PLAVIX) 75 MG tablet Take 1 tablet (75 mg total) by mouth daily with breakfast. 05/30/19  Yes Burtis Junes, NP  diltiazem (CARDIZEM CD) 120 MG 24 hr capsule Take 120 mg by mouth daily.  04/05/19  Yes [provider]  donepezil (ARICEPT) 5 MG tablet Take 5 mg by mouth daily.  01/10/18  Yes [provider]  DULoxetine (CYMBALTA) 60 MG capsule Take 60 mg by mouth every morning.    Yes [provider]  furosemide (LASIX) 40 MG tablet Take 40 mg by mouth every other day. 07/18/19  Yes [provider]  gabapentin (NEURONTIN) 300 MG capsule Take 300 mg by mouth 3 (three) times daily.    Yes [provider]  ipratropium-albuterol (DUONEB) 0.5-2.5 (3) MG/3ML SOLN Take 3 mLs by nebulization in the morning, at noon, in the evening, and at bedtime. 09/10/19  Yes Tanda Rockers, MD  Lancets (FREESTYLE) lancets Use as instructed Patient taking differently: 1 each by Other route as directed.  01/13/15  Yes Reyne Dumas, MD  letrozole [facility]) 2.5 MG tablet Take 2.5 mg by mouth every morning.    Yes [provider]  lidocaine (LIDODERM) 5 % Place 1 patch onto the skin as needed (pain).  08/16/19 11/14/19 Yes [provider]  metFORMIN (GLUCOPHAGE) 1000 MG tablet Take 1,000 mg by mouth 2 (two) times daily. 07/09/19  Yes [provider]  methocarbamol (ROBAXIN) 750 MG tablet Take 750 mg by mouth every 8 (eight) hours as needed for  muscle spasms.   Yes [provider]  metoprolol succinate (TOPROL-XL) 25 MG 24 hr tablet Take 25 mg by mouth daily.  04/05/19  Yes [provider]  morphine 20 MG/5ML solution Take 0.03 mg by mouth See admin instructions. Taking 0.81m every 2 hours as needed for pain 07/10/19  Yes [provider]  Multiple Vitamin (MULTIVITAMIN) tablet Take 1 tablet by mouth daily.    Yes [provider]  nitroGLYCERIN (NITROSTAT) 0.4 MG SL tablet Place 1 tablet (0.4 mg total) under the tongue every 5 (five) minutes as needed for chest pain (may repeat x3). 07/22/19  Yes WMartyn Ehrich NP  OXYGEN 1 each by Other route See admin instructions. Use 4L 24/7   Yes [provider]  pantoprazole (PROTONIX) 40 MG tablet Take 40 mg by mouth daily. 05/05/19  Yes [provider]  potassium chloride SA (KLOR-CON M20) 20 MEQ tablet Take 1 tablet (20 mEq total) by mouth daily. 07/01/19  Yes NJosue Hector MD  Respiratory Therapy Supplies (FLUTTER) DEVI Use as directed Patient taking differently: 1 each by Other route as directed.  07/13/17  Yes WTanda Rockers MD  Tiotropium Bromide Monohydrate (SPIRIVA RESPIMAT) 2.5 MCG/ACT AERS Inhale 2 puffs into the lungs daily. 10/26/16  Yes Parrett, Tammy S, NP  budesonide-formoterol (SYMBICORT) 160-4.5 MCG/ACT inhaler Inhale 2 puffs into the lungs 2 (two) times daily. Patient not taking: Reported on 09/20/2019 10/26/16   Parrett, TFonnie Mu NP  predniSONE (DELTASONE) 10 MG tablet Take 4 tablets (40 mg) daily for  2 days, then, Take 3 tablets (30 mg) daily for 2 days, then, Take 2 tablets (20 mg) daily for 2 days, then, Take 1 tablets (10 mg) daily for 1 days, then stop Patient not taking: Reported on 09/20/2019 09/06/19   Jonetta Osgood, MD    Allergies    Patient has no active allergies.  Review of Systems   Review of Systems  Unable to perform ROS: Mental status change    Physical Exam Updated Vital Signs BP 120/69 (BP Location:  Right Arm)   Pulse (!) 113   Temp 98.2 F (36.8 C) (Oral)   Resp (!) 22   Ht '5\' 6"'$  (1.676 m)   Wt 68.5 kg   SpO2 100%   BMI 24.37 kg/m   Physical Exam Vitals and nursing note reviewed.  Constitutional:      Appearance: She is well-developed.  HENT:     Head: Normocephalic and atraumatic.     Right Ear: External ear normal.     Left Ear: External ear normal.     Nose: Nose normal.  Eyes:     General:        Right eye: No discharge.        Left eye: No discharge.  Cardiovascular:     Rate and Rhythm: Regular rhythm. Tachycardia present.     Heart sounds: Normal heart sounds.  Pulmonary:     Effort: Tachypnea present. No respiratory distress.     Breath sounds: Wheezing (diffuse, expiratory) present.  Abdominal:     Palpations: Abdomen is soft.     Tenderness: There is abdominal tenderness (lower).  Skin:    General: Skin is warm and dry.  Neurological:     Mental Status: She is alert.  Psychiatric:        Mood and Affect: Mood is not anxious.     ED Results / Procedures / Treatments   Labs (all labs ordered are listed, but only abnormal results are displayed) Labs Reviewed  CBC WITH DIFFERENTIAL/PLATELET - Abnormal; Notable for the following components:      Result Value   WBC 11.0 (*)    Hemoglobin 10.7 (*)    MCHC 28.7 (*)    Neutro Abs 7.9 (*)    Monocytes Absolute 1.1 (*)    All other components within normal limits  COMPREHENSIVE METABOLIC PANEL - Abnormal; Notable for the following components:   Chloride 94 (*)    CO2 36 (*)    Glucose, Bld 205 (*)    All other components within normal limits  CBG MONITORING, ED - Abnormal; Notable for the following components:   Glucose-Capillary 214 (*)    All other components within normal limits  I-STAT ARTERIAL BLOOD GAS, ED - Abnormal; Notable for the following components:   pH, Arterial 7.235 (*)    pCO2 arterial 94.3 (*)    Bicarbonate 40.0 (*)    TCO2 43 (*)    Acid-Base Excess 9.0 (*)    HCT 34.0 (*)      Hemoglobin 11.6 (*)    All other components within normal limits  I-STAT ARTERIAL BLOOD GAS, ED - Abnormal; Notable for the following components:   pCO2 arterial 65.8 (*)    pO2, Arterial 69 (*)    Bicarbonate 37.0 (*)    TCO2 39 (*)    Acid-Base Excess 9.0 (*)    HCT 34.0 (*)    Hemoglobin 11.6 (*)    All other components within normal limits  CBG MONITORING,  ED - Abnormal; Notable for the following components:   Glucose-Capillary 244 (*)    All other components within normal limits  SARS CORONAVIRUS 2 BY RT PCR (HOSPITAL ORDER, [facility])  URINE CULTURE  CULTURE, BLOOD (ROUTINE X 2)  CULTURE, BLOOD (ROUTINE X 2)  BRAIN NATRIURETIC PEPTIDE  ETHANOL  AMMONIA  LIPASE, BLOOD  LACTIC ACID, PLASMA  URINALYSIS, ROUTINE W REFLEX MICROSCOPIC  LACTIC ACID, PLASMA  HEMOGLOBIN A1C  TROPONIN I (HIGH SENSITIVITY)  TROPONIN I (HIGH SENSITIVITY)    EKG EKG Interpretation  Date/Time:  Friday September 20 2019 07:27:41 EDT Ventricular Rate:  117 PR Interval:    QRS Duration: 100 QT Interval:  336 QTC Calculation: 469 R Axis:   110 Text Interpretation: Sinus tachycardia Ventricular premature complex LAE, consider biatrial enlargement Right axis deviation Low voltage, precordial leads Probable anterolateral infarct, old Similar to September 04 2019 Confirmed by Sherwood Gambler [phone]) on 09/20/2019 8:01:04 AM   Radiology CT Head Wo Contrast  Result Date: 09/20/2019 CLINICAL DATA:  Encephalopathy.  Respiratory distress. EXAM: CT HEAD WITHOUT CONTRAST TECHNIQUE: Contiguous axial images were obtained from the base of the skull through the vertex without intravenous contrast. COMPARISON:  04/26/2019 FINDINGS: Brain: Mild age related volume loss. No old or acute focal infarction, mass lesion, hemorrhage, hydrocephalus or extra-axial collection. Vascular: There is atherosclerotic calcification of the major vessels at the base of the brain. Skull: Negative Sinuses/Orbits:  Clear/normal Other: None IMPRESSION: No acute or significant head CT finding. Mild age related volume loss. Electronically Signed   By: Nelson Chimes M.D.   On: 09/20/2019 10:48   CT ABDOMEN PELVIS W CONTRAST  Result Date: 09/20/2019 CLINICAL DATA:  Abdominal pain. EXAM: CT ABDOMEN AND PELVIS WITH CONTRAST TECHNIQUE: Multidetector CT imaging of the abdomen and pelvis was performed using the standard protocol following bolus administration of intravenous contrast. CONTRAST:  134m OMNIPAQUE IOHEXOL 300 MG/ML  SOLN COMPARISON:  06/08/2019 FINDINGS: Lower chest: Redemonstration of emphysema but without focal infiltrate, collapse or effusion. Pericardial effusion in left pleural effusion previously seen have resolved. Skin thickening of the left breast again. Hepatobiliary: Previous cholecystectomy. Liver parenchyma appears normal. Pancreas: Normal Spleen: Normal Adrenals/Urinary Tract: Adrenal glands are normal. Kidneys are normal. Bladder is normal. Stomach/Bowel: Stomach and small intestine appear unremarkable. Moderate amount of fecal matter within the colon but without evidence inflammation or frank obstruction. Vascular/Lymphatic: Aortic atherosclerosis. No aneurysm. IVC is normal. No retroperitoneal adenopathy. Reproductive: Previous hysterectomy.  No pelvic mass. Other: No free air or fluid. Musculoskeletal: Ordinary lumbar degenerative changes. Previous vertebral augmentation L1. IMPRESSION: No acute abdominal finding. Large amount stool and gas in the colon which could conceivably be symptomatic. Aortic Atherosclerosis (ICD10-I70.0) and Emphysema (ICD10-J43.9). Skin thickening of the left breast as seen previously. Resolution of previously seen pericardial effusion left pleural effusion. Electronically Signed   By: MNelson ChimesM.D.   On: 09/20/2019 10:51   DG Chest Portable 1 View  Result Date: 09/20/2019 CLINICAL DATA:  Shortness of breath. EXAM: PORTABLE CHEST 1 VIEW COMPARISON:  09/04/2019.   06/16/2019.  06/08/2019.  04/26/2019. FINDINGS: Mediastinum and hilar structures normal. Heart size normal. Chronic bilateral interstitial prominence. Findings most consistent chronic interstitial lung disease. Superimposed active pneumonitis cannot be excluded. No pleural effusion or pneumothorax. No acute bony abnormality. IMPRESSION: Chronic bilateral interstitial prominence. Findings most consistent chronic interstitial lung disease. Superimposed pneumonitis cannot be excluded. Electronically Signed   By: TMarcello Moores Register   On: 09/20/2019 07:54    Procedures .Critical  Care Performed by: Sherwood Gambler, MD Authorized by: Sherwood Gambler, MD   Critical care provider statement:    Critical care time (minutes):  45   Critical care time was exclusive of:  Separately billable procedures and treating other patients   Critical care was necessary to treat or prevent imminent or life-threatening deterioration of the following conditions:  Respiratory failure   Critical care was time spent personally by me on the following activities:  Discussions with consultants, evaluation of patient's response to treatment, examination of patient, ordering and performing treatments and interventions, ordering and review of laboratory studies, ordering and review of radiographic studies, pulse oximetry, re-evaluation of patient's condition, obtaining history from patient or surrogate and review of old charts   (including critical care time)  Medications Ordered in ED Medications  apixaban (ELIQUIS) tablet 5 mg (0 mg Oral Hold 09/20/19 1223)  clopidogrel (PLAVIX) tablet 75 mg (0 mg Oral Hold 09/20/19 1223)  letrozole [facility]) tablet 2.5 mg (0 mg Oral Hold 09/20/19 1226)  metoprolol succinate (TOPROL-XL) 24 hr tablet 25 mg (0 mg Oral Hold 09/20/19 1226)  furosemide (LASIX) tablet 40 mg (0 mg Oral Hold 09/20/19 1225)  diltiazem (CARDIZEM CD) 24 hr capsule 120 mg (0 mg Oral Hold 09/20/19 1224)  atorvastatin (LIPITOR) tablet 80  mg (has no administration in time range)  donepezil (ARICEPT) tablet 5 mg (0 mg Oral Hold 09/20/19 1224)  acidophilus (RISAQUAD) capsule 1 capsule (0 capsules Oral Hold 09/20/19 1222)  pantoprazole (PROTONIX) EC tablet 40 mg (0 mg Oral Hold 09/20/19 1227)  gabapentin (NEURONTIN) capsule 300 mg (0 mg Oral Hold 09/20/19 1225)  methocarbamol (ROBAXIN) tablet 750 mg (has no administration in time range)  ipratropium-albuterol (DUONEB) 0.5-2.5 (3) MG/3ML nebulizer solution 3 mL (3 mLs Nebulization Given 09/20/19 1300)  lidocaine (LIDODERM) 5 % 1 patch (has no administration in time range)  sodium chloride flush (NS) 0.9 % injection 3 mL (3 mLs Intravenous Given 09/20/19 1227)  acetaminophen (TYLENOL) tablet 650 mg (has no administration in time range)    Or  acetaminophen (TYLENOL) suppository 650 mg (has no administration in time range)  ondansetron (ZOFRAN) tablet 4 mg (has no administration in time range)    Or  ondansetron (ZOFRAN) injection 4 mg (has no administration in time range)  insulin aspart (novoLOG) injection 0-6 Units (2 Units Subcutaneous Given 09/20/19 1404)  methylPREDNISolone sodium succinate (SOLU-MEDROL) 125 mg/2 mL injection 60 mg (has no administration in time range)  sodium chloride 0.9 % bolus 500 mL (0 mLs Intravenous Stopped 09/20/19 1100)  methylPREDNISolone sodium succinate (SOLU-MEDROL) 125 mg/2 mL injection 125 mg (125 mg Intravenous Given 09/20/19 0811)  albuterol (PROVENTIL,VENTOLIN) solution continuous neb (10 mg/hr Nebulization Given 09/20/19 0847)  LORazepam (ATIVAN) injection 0.5 mg (0.5 mg Intravenous Given 09/20/19 0812)  iohexol (OMNIPAQUE) 300 MG/ML solution 100 mL (100 mLs Intravenous Contrast Given 09/20/19 1038)    ED Course  I have reviewed the triage vital signs and the nursing notes.  Pertinent labs & imaging results that were available during my care of the patient were reviewed by me and considered in my medical decision making (see chart for details).    MDM  Rules/Calculators/A&P                          Patient is altered but protecting her airway.  Given high concern for CO2 narcosis, she was placed on BiPAP and ABG obtained.  The first ABG does not crossover because the CO2  was too high.  Thankfully this is improved on subsequent gases and she does seem to be doing clinically better.  She had reported some abdominal discomfort but CT just shows a fair amount of stool.  She will be admitted to the hospitalist service for further treatment and care.  No obvious bacterial illness.  ANGELISA WINTHROP was evaluated in Emergency Department on 09/20/2019 for the symptoms described in the history of present illness. She was evaluated in the context of the global COVID-19 pandemic, which necessitated consideration that the patient might be at risk for infection with the SARS-CoV-2 virus that causes COVID-19. Institutional protocols and algorithms that pertain to the evaluation of patients at risk for COVID-19 are in a state of rapid change based on information released by regulatory bodies including the CDC and federal and state organizations. These policies and algorithms were followed during the patient's care in the ED.  Final Clinical Impression(s) / ED Diagnoses Final diagnoses:  Acute on chronic respiratory failure with hypercapnia [facility])    Rx / DC Orders ED Discharge Orders    None       Sherwood Gambler, MD 09/20/19 1500

## 2019-09-20 NOTE — H&P (Signed)
History and Physical    Patricia Wall ION:[id number] DOB: 1951-03-08 DOA: 09/20/2019  Referring MD/NP/PA: Sherwood Gambler, MD PCP: Bernerd Limbo, MD  Patient coming from: Home via EMS  Chief Complaint: couldn't breath  I have personally briefly reviewed patient's old medical records in [place]   HPI: Patricia Wall is a 69 y.o. female with medical history significant of COPD, chronic respiratory failure with hypoxia on 4 L nasal cannula oxygen, hypertension, CAD, diastolic CHF, breast cancer s/p lumpectomy and radiation treatment, and diabetes mellitus type 2 who present with difficulty breathing over the last 1-2 days. History comes from patient and her daughter over the phone.  Apparently at home the patient had been having trouble breathing.  She reports having a cough with some sputum production.  Her daughter is her primary caregiver and she is currently on hospice.  Due to the patient's difficulty breathing she had her on BiPAP all of yesterday and anytime she took her off patient was struggling to breathe.  Due to patient having no improvement in symptoms she called EMS.  Daughter also notes that she has not been sleeping well and having nightmares which just recently started in the last 2 weeks.  Daughter administers her morphine and is only takes 0.6 milliliters twice daily, and states that it does not make the patient more sleepy or lethargic.  ED Course: Upon admission into the emergency department patient was noted to be afebrile with pulse 110 121, respiration 18-27, blood pressure is elevated up to 196/86, and O2 saturations 90-100% on BiPAP.  Patient's initial ABG had a CO2 which was undetectable.  Repeat ABG pH 7.235, PCO2 94.3, and PO2 105.  Labs significant for WBC 11, hemoglobin 11.8, CO2 36, and glucose 205, all other labs relatively within normal limits.  Review of Systems  Unable to perform ROS: Severe respiratory distress  Constitutional: Negative for fever.    Respiratory: Positive for cough, sputum production and shortness of breath.   Cardiovascular: Negative for chest pain and leg swelling.  Musculoskeletal: Positive for myalgias.  Neurological: Negative for loss of consciousness.  Psychiatric/Behavioral: The patient has insomnia.     Past Medical History:  Diagnosis Date  . Abnormal chest xray   . ABUSE, ALCOHOL, UNSPECIFIED 09/04/2006   Qualifier: Diagnosis of  By: Amil Amen MD, Benjamine Mola    . Acute diastolic CHF (congestive heart failure) ([place])   . Acute on chronic respiratory failure with hypercapnia ([place])   . Acute respiratory failure with hypoxia ([place]) 01/07/2015  . Alcohol abuse   . Angina, class III ([place]) 03/29/2007   Qualifier: Diagnosis of  By: Amil Amen MD, Benjamine Mola    . ARF (acute renal failure) ([place]) 04/27/2019  . Back pain   . CAD (coronary artery disease)     Prev seen by [facility].  Stent to OM and LAD Last cath 6/11 cathed on June 13, [name]. 2011.  The cardiac catheterization showed 30% and 50% lesions in the mid  and distal LAD, 50% in the OM1, 40% in the OM2 with 20% in-stent  restenosis, 40% in-stent restenosis in the circumflex and no significant  disease in the RCA.  Her EF was normal.  Dr. Olevia Perches recommended a trial  of proton pump inhibitors   . CAD S/P percutaneous coronary angioplasty: Prior Cypher DES to pLAD, pOM2 & AVG Cx (after Om2); New DES to AVG Cx ISR & Angiosculpt PTCA of Ostial AVG Cx from OM2. 09/04/2006   Qualifier: Diagnosis of  By: Amil Amen MD,  [place]    . Chest pain   . COPD (chronic obstructive pulmonary disease) ([place])   . COPD GOLD II / still smoking  09/04/2006   Followed in Pulmonary clinic/ [facility]/ [place]  Active smoker  - pft's 12/12/12 FEV1  1.41 (53%) ratio 54 with no significant reversibility and DLCO 47% corrects to 50 01/16/2013  Walked RA x 2 laps @ 185 ft each stopped due to sob, no desat     - Trial of anoro 01/16/2013 > helped but insurance would not pay - incruse one daily started  10/02/2013 >>>not covered  -Spiriva restarted 10/15/13 >  . Cyanosis   . DDD (degenerative disc disease)   . DEGENERATIVE DISC DISEASE, CERVICAL SPINE 09/04/2006   Annotation: c6-7 on the right Qualifier: Diagnosis of  By: Amil Amen MD, Benjamine Mola    . Depression   . Diarrhea   . Diverticular disease   . DIVERTICULOSIS, COLON 09/04/2006   Qualifier: Diagnosis of  By: Amil Amen MD, Benjamine Mola    . GERD (gastroesophageal reflux disease)   . H/O: hysterectomy   . Hemorrhoids   . HTN (hypertension)   . IBS (irritable bowel syndrome)   . Insomnia   . Irritable bowel syndrome 09/04/2006   Qualifier: Diagnosis of  By: Amil Amen MD, Benjamine Mola    . Lymphadenitis   . PEPTIC ULCER DISEASE 09/04/2006   Annotation: distant history of  Qualifier: Diagnosis of  By: Amil Amen MD, Benjamine Mola    . Pharyngitis   . PHARYNGITIS 07/18/2007   Qualifier: Diagnosis of  By: Radene Ou MD, [place]    . PUD (peptic ulcer disease)   . PVD (peripheral vascular disease) ([place])   . Sinusitis   . TIA (transient ischemic attack)   . Tobacco abuse   . Vertigo     Past Surgical History:  Procedure Laterality Date  . ABDOMINAL HYSTERECTOMY    . APPENDECTOMY    . BREAST LUMPECTOMY Left 2015  . CARDIAC CATHETERIZATION  2005, 2006, 2009,2011  . CHOLECYSTECTOMY    . CORONARY STENT INTERVENTION N/A 04/22/2019   Procedure: CORONARY STENT INTERVENTION;  Surgeon: Troy Sine, MD;  Location: [place] [facility];  Service: Cardiovascular;  Laterality: N/A;  . CORONARY STENT PLACEMENT     Status post balloon angiogram plasty and Cypher stent to the distal    circumflex and OM2 branch  . ESOPHAGOGASTRODUODENOSCOPY (EGD) WITH PROPOFOL Left 04/21/2019   Procedure: ESOPHAGOGASTRODUODENOSCOPY (EGD) WITH PROPOFOL;  Surgeon: Arta Silence, MD;  Location: [place];  Service: Endoscopy;  Laterality: Left;  . Hammertoe repair    . Left Bunionectomy    . LEFT HEART CATH AND CORONARY ANGIOGRAPHY N/A 04/22/2019   Procedure: LEFT HEART CATH  AND CORONARY ANGIOGRAPHY;  Surgeon: Troy Sine, MD;  Location: [place] [facility];  Service: Cardiovascular;  Laterality: N/A;  . LEFT HEART CATHETERIZATION WITH CORONARY ANGIOGRAM N/A 12/03/2013   Procedure: LEFT HEART CATHETERIZATION WITH CORONARY ANGIOGRAM;  Surgeon: Leonie Man, MD;  Location[facility];  Service: Cardiovascular;  Laterality: N/A;  . TUBAL LIGATION       reports that she has quit smoking. Her smoking use included cigarettes. She has a 14.75 pack-year smoking history. She has never used smokeless tobacco. She reports current drug use. She reports that she does not drink alcohol.  No Known Allergies  Family History  Problem Relation Age of Onset  . Heart disease Mother   . Rheumatologic disease Mother   . Cancer - Other Mother  Uterine  . Emphysema Father   . Heart disease Father   . Cancer - Other Father   . Coronary artery disease Other   . Asthma Sister        2 sisters  . Stroke Sister     Prior to Admission medications   Medication Sig Start Date End Date Taking? Authorizing Provider  acetaminophen (TYLENOL) 325 MG tablet Take 2 tablets (650 mg total) by mouth every 4 (four) hours as needed for headache or mild pain. 04/23/19  Yes Sheikh, Omair Latif, DO  acidophilus (RISAQUAD) CAPS capsule Take 1 capsule by mouth 2 (two) times daily. 04/09/19  Yes [provider]  albuterol (PROVENTIL) (2.5 MG/3ML) 0.083% nebulizer solution USE 1 VIAL IN NEBULIZER EVERY 4 HOURS FOR WHEEZING OR SHORTNESS OF BREATH. Patient taking differently: Take 2.5 mg by nebulization every 4 (four) hours as needed for wheezing or shortness of breath.  04/29/19  Yes Tanda Rockers, MD  albuterol (VENTOLIN HFA) 108 (90 Base) MCG/ACT inhaler Inhale 2 puffs into the lungs every 6 (six) hours as needed for wheezing or shortness of breath. 07/22/19  Yes Martyn Ehrich, NP  apixaban (ELIQUIS) 5 MG TABS tablet Take 1 tablet (5 mg total) by mouth 2 (two) times daily. 05/30/19   Yes Josue Hector, MD  atorvastatin (LIPITOR) 80 MG tablet Take 80 mg by mouth daily.   Yes [provider]  blood glucose meter kit and supplies Dispense based on patient and insurance preference. Use up to four times daily as directed. (FOR ICD-9 250.00, 250.01). Patient taking differently: 1 each by Other route See admin instructions. Dispense based on patient and insurance preference. Use up to four times daily as directed. (FOR ICD-9 250.00, 250.01). 01/13/15  Yes Reyne Dumas, MD  calcium carbonate (OS-CAL) 600 MG TABS Take 600 mg by mouth every morning.    Yes [provider]  Cholecalciferol (VITAMIN D) 2000 UNITS tablet Take 2,000 Units by mouth every morning.    Yes [provider]  clopidogrel (PLAVIX) 75 MG tablet Take 1 tablet (75 mg total) by mouth daily with breakfast. 05/30/19  Yes Burtis Junes, NP  diltiazem (CARDIZEM CD) 120 MG 24 hr capsule Take 120 mg by mouth daily.  04/05/19  Yes [provider]  donepezil (ARICEPT) 5 MG tablet Take 5 mg by mouth daily.  01/10/18  Yes [provider]  DULoxetine (CYMBALTA) 60 MG capsule Take 60 mg by mouth every morning.    Yes [provider]  furosemide (LASIX) 40 MG tablet Take 40 mg by mouth every other day. 07/18/19  Yes [provider]  gabapentin (NEURONTIN) 300 MG capsule Take 300 mg by mouth 3 (three) times daily.    Yes [provider]  ipratropium-albuterol (DUONEB) 0.5-2.5 (3) MG/3ML SOLN Take 3 mLs by nebulization in the morning, at noon, in the evening, and at bedtime. 09/10/19  Yes Tanda Rockers, MD  Lancets (FREESTYLE) lancets Use as instructed Patient taking differently: 1 each by Other route as directed.  01/13/15  Yes Reyne Dumas, MD  letrozole [facility]) 2.5 MG tablet Take 2.5 mg by mouth every morning.    Yes [provider]  lidocaine (LIDODERM) 5 % Place 1 patch onto the skin as needed (pain).  08/16/19 11/14/19 Yes [provider]   metFORMIN (GLUCOPHAGE) 1000 MG tablet Take 1,000 mg by mouth 2 (two) times daily. 07/09/19  Yes [provider]  methocarbamol (ROBAXIN) 750 MG tablet Take 750 mg  by mouth every 8 (eight) hours as needed for muscle spasms.   Yes [provider]  metoprolol succinate (TOPROL-XL) 25 MG 24 hr tablet Take 25 mg by mouth daily.  04/05/19  Yes [provider]  morphine 20 MG/5ML solution Take 0.03 mg by mouth See admin instructions. Taking 0.72m every 2 hours as needed for pain 07/10/19  Yes [provider]  Multiple Vitamin (MULTIVITAMIN) tablet Take 1 tablet by mouth daily.    Yes [provider]  nitroGLYCERIN (NITROSTAT) 0.4 MG SL tablet Place 1 tablet (0.4 mg total) under the tongue every 5 (five) minutes as needed for chest pain (may repeat x3). 07/22/19  Yes WMartyn Ehrich NP  OXYGEN 1 each by Other route See admin instructions. Use 4L 24/7   Yes [provider]  pantoprazole (PROTONIX) 40 MG tablet Take 40 mg by mouth daily. 05/05/19  Yes [provider]  potassium chloride SA (KLOR-CON M20) 20 MEQ tablet Take 1 tablet (20 mEq total) by mouth daily. 07/01/19  Yes NJosue Hector MD  Respiratory Therapy Supplies (FLUTTER) DEVI Use as directed Patient taking differently: 1 each by Other route as directed.  07/13/17  Yes WTanda Rockers MD  Tiotropium Bromide Monohydrate (SPIRIVA RESPIMAT) 2.5 MCG/ACT AERS Inhale 2 puffs into the lungs daily. 10/26/16  Yes Parrett, Tammy S, NP  budesonide-formoterol (SYMBICORT) 160-4.5 MCG/ACT inhaler Inhale 2 puffs into the lungs 2 (two) times daily. Patient not taking: Reported on 09/20/2019 10/26/16   Parrett, TFonnie Mu NP  predniSONE (DELTASONE) 10 MG tablet Take 4 tablets (40 mg) daily for 2 days, then, Take 3 tablets (30 mg) daily for 2 days, then, Take 2 tablets (20 mg) daily for 2 days, then, Take 1 tablets (10 mg) daily for 1 days, then stop Patient not taking: Reported on 09/20/2019 09/06/19   GJonetta Osgood MD    Physical Exam:  Constitutional: Elderly female who appears to be lethargic, but we will arouse to verbal stimuli Vitals:   09/20/19 0713 09/20/19 0714 09/20/19 0842 09/20/19 0950  BP:   (!) 173/75   Pulse:   (!) 110 (!) 121  Resp:   (!) 26 (!) 27  Temp:      TempSrc:      SpO2: 100%  100% 90%  Weight:  68.5 kg    Height:  5' 6"  (1.676 m)     Eyes: PERRL, lids and conjunctivae normal ENMT: Mucous membranes are dry. Posterior pharynx clear of any exudate or lesions.  Neck: normal, supple, no masses, no thyromegaly Respiratory: Patient currently on BiPAP only able to talk in shortened sentences with expiratory wheeze appreciated Cardiovascular: Tachycardic, no murmurs / rubs / gallops. No extremity edema. 2+ pedal pulses. No carotid bruits.  Abdomen: no tenderness, no masses palpated. No hepatosplenomegaly. Bowel sounds positive.  Musculoskeletal: no clubbing / cyanosis. No joint deformity upper and lower extremities. Good ROM, no contractures. Normal muscle tone.  Skin: no rashes, lesions, ulcers. No induration Neurologic: CN 2-12 grossly intact. Sensation intact, DTR normal. Strength 5/5 in all 4.  Psychiatric: Normal judgment and insight. Alert and oriented x 3. Normal mood.     Labs on Admission: I have personally reviewed following labs and imaging studies  CBC: Recent Labs  Lab 09/20/19 0753 09/20/19 0947  WBC 11.0*  --   NEUTROABS 7.9*  --   HGB 10.7* 11.6*  HCT 37.3 34.0*  MCV 94.0  --   PLT 155  --  Basic Metabolic Panel: Recent Labs  Lab 09/20/19 0753 09/20/19 0947  NA 137 137  K 4.4 4.2  CL 94*  --   CO2 36*  --   GLUCOSE 205*  --   BUN 12  --   CREATININE 0.74  --   CALCIUM 9.2  --    GFR: Estimated Creatinine Clearance: 60.4 mL/min (by C-G formula based on SCr of 0.74 mg/dL). Liver Function Tests: Recent Labs  Lab 09/20/19 0753  AST 19  ALT 20  ALKPHOS 76  BILITOT 0.3  PROT 6.7  ALBUMIN 3.6   Recent Labs  Lab  09/20/19 0753  LIPASE 26   Recent Labs  Lab 09/20/19 0753  AMMONIA 16   Coagulation Profile: No results for input(s): INR, PROTIME in the last 168 hours. Cardiac Enzymes: No results for input(s): CKTOTAL, CKMB, CKMBINDEX, TROPONINI in the last 168 hours. BNP (last 3 results) Recent Labs    04/15/19 1225  PROBNP 711*   HbA1C: No results for input(s): HGBA1C in the last 72 hours. CBG: Recent Labs  Lab 09/20/19 0821  GLUCAP 214*   Lipid Profile: No results for input(s): CHOL, HDL, LDLCALC, TRIG, CHOLHDL, LDLDIRECT in the last 72 hours. Thyroid Function Tests: No results for input(s): TSH, T4TOTAL, FREET4, T3FREE, THYROIDAB in the last 72 hours. Anemia Panel: No results for input(s): VITAMINB12, FOLATE, FERRITIN, TIBC, IRON, RETICCTPCT in the last 72 hours. Urine analysis:    Component Value Date/Time   COLORURINE YELLOW 06/08/2019 0613   APPEARANCEUR CLEAR 06/08/2019 0613   LABSPEC 1.018 06/08/2019 0613   PHURINE 5.0 06/08/2019 0613   GLUCOSEU NEGATIVE 06/08/2019 0613   HGBUR NEGATIVE 06/08/2019 0613   BILIRUBINUR NEGATIVE 06/08/2019 0613   KETONESUR NEGATIVE 06/08/2019 0613   PROTEINUR 30 (A) 06/08/2019 0613   UROBILINOGEN 1.0 10/10/2009 2052   NITRITE NEGATIVE 06/08/2019 0613   LEUKOCYTESUR NEGATIVE 06/08/2019 8119   Sepsis Labs: Recent Results (from the past 240 hour(s))  SARS Coronavirus 2 by RT PCR (hospital order, performed in [facility] hospital lab) Nasopharyngeal Nasopharyngeal Swab     Status: None   Collection Time: 09/20/19  7:53 AM   Specimen: Nasopharyngeal Swab  Result Value Ref Range Status   SARS Coronavirus 2 NEGATIVE NEGATIVE Final    Comment: (NOTE) SARS-CoV-2 target nucleic acids are NOT DETECTED.  The SARS-CoV-2 RNA is generally detectable in upper and lower respiratory specimens during the acute phase of infection. The lowest concentration of SARS-CoV-2 viral copies this assay can detect is 250 copies / mL. A negative result does not  preclude SARS-CoV-2 infection and should not be used as the sole basis for treatment or other patient management decisions.  A negative result may occur with improper specimen collection / handling, submission of specimen other than nasopharyngeal swab, presence of viral mutation(s) within the areas targeted by this assay, and inadequate number of viral copies (<250 copies / mL). A negative result must be combined with clinical observations, patient history, and epidemiological information.  Fact Sheet for Patients:   StrictlyIdeas.no  Fact Sheet for Healthcare Providers: BankingDealers.co.za  This test is not yet approved or  cleared by the [place] FDA and has been authorized for detection and/or diagnosis of SARS-CoV-2 by FDA under an Emergency Use Authorization (EUA).  This EUA will remain in effect (meaning this test can be used) for the duration of the COVID-19 declaration under Section 564(b)(1) of the Act, 21 U.S.C. section 360bbb-3(b)(1), unless the authorization is terminated or revoked sooner.  Performed at  [facility], [place] [address], [place], [place] [address]      Radiological Exams on Admission: CT Head Wo Contrast  Result Date: 09/20/2019 CLINICAL DATA:  Encephalopathy.  Respiratory distress. EXAM: CT HEAD WITHOUT CONTRAST TECHNIQUE: Contiguous axial images were obtained from the base of the skull through the vertex without intravenous contrast. COMPARISON:  04/26/2019 FINDINGS: Brain: Mild age related volume loss. No old or acute focal infarction, mass lesion, hemorrhage, hydrocephalus or extra-axial collection. Vascular: There is atherosclerotic calcification of the major vessels at the base of the brain. Skull: Negative Sinuses/Orbits: Clear/normal Other: None IMPRESSION: No acute or significant head CT finding. Mild age related volume loss. Electronically Signed   By: Nelson Chimes M.D.   On: 09/20/2019 10:48     CT ABDOMEN PELVIS W CONTRAST  Result Date: 09/20/2019 CLINICAL DATA:  Abdominal pain. EXAM: CT ABDOMEN AND PELVIS WITH CONTRAST TECHNIQUE: Multidetector CT imaging of the abdomen and pelvis was performed using the standard protocol following bolus administration of intravenous contrast. CONTRAST:  121m OMNIPAQUE IOHEXOL 300 MG/ML  SOLN COMPARISON:  06/08/2019 FINDINGS: Lower chest: Redemonstration of emphysema but without focal infiltrate, collapse or effusion. Pericardial effusion in left pleural effusion previously seen have resolved. Skin thickening of the left breast again. Hepatobiliary: Previous cholecystectomy. Liver parenchyma appears normal. Pancreas: Normal Spleen: Normal Adrenals/Urinary Tract: Adrenal glands are normal. Kidneys are normal. Bladder is normal. Stomach/Bowel: Stomach and small intestine appear unremarkable. Moderate amount of fecal matter within the colon but without evidence inflammation or frank obstruction. Vascular/Lymphatic: Aortic atherosclerosis. No aneurysm. IVC is normal. No retroperitoneal adenopathy. Reproductive: Previous hysterectomy.  No pelvic mass. Other: No free air or fluid. Musculoskeletal: Ordinary lumbar degenerative changes. Previous vertebral augmentation L1. IMPRESSION: No acute abdominal finding. Large amount stool and gas in the colon which could conceivably be symptomatic. Aortic Atherosclerosis (ICD10-I70.0) and Emphysema (ICD10-J43.9). Skin thickening of the left breast as seen previously. Resolution of previously seen pericardial effusion left pleural effusion. Electronically Signed   By: MNelson ChimesM.D.   On: 09/20/2019 10:51   DG Chest Portable 1 View  Result Date: 09/20/2019 CLINICAL DATA:  Shortness of breath. EXAM: PORTABLE CHEST 1 VIEW COMPARISON:  09/04/2019.  06/16/2019.  06/08/2019.  04/26/2019. FINDINGS: Mediastinum and hilar structures normal. Heart size normal. Chronic bilateral interstitial prominence. Findings most consistent chronic  interstitial lung disease. Superimposed active pneumonitis cannot be excluded. No pleural effusion or pneumothorax. No acute bony abnormality. IMPRESSION: Chronic bilateral interstitial prominence. Findings most consistent chronic interstitial lung disease. Superimposed pneumonitis cannot be excluded. Electronically Signed   By: TMarcello Moores Register   On: 09/20/2019 07:54    EKG: Independently reviewed.sinus tacycardia at 118  Assessment/Plan Hypercapnic and hypoxic respiratory failure with COPD exacerbation: Acute on chronic.  Patient just recently hospitalized from 6/16-6/18 for the same.  Chest x-ray showed chronic bilateral interstitial prominence consistent with chronic interstitial lung disease. -Admit to a progressive bed -Continuous pulse oximetry -BiPAP and wean back to nasal cannula oxygen as able -N.p.o. while on BiPAP -Continue DuoNebs 4 times daily -Solu-Medrol 60 mg IV every 8 hours  Leukocytosis: Acute.  WBC elevated at 11.  Patient otherwise noted to be afebrile. -Recheck CBC in a.m.  Paroxysmal atrial fibrillation: On chronic anticoagulation of Eliquis. -Continue Eliquis  Essential hypertension: Home blood pressure medications include Cardizem 120 mg daily, metoprolol 25 mg daily, furosemide 40 mg every other day -Continue home medications when able  CAD: Patient last PCI in February 2021. -Continue Plavix, statin, and beta-blocker when able  Chronic diastolic congestive heart failure: Patient appears compensated, and BNP within normal limits at 59.9. -Strict intake and output -Daily weight  Diabetes mellitus type 2: Home medications include Metformin 1000 mg twice daily.  Last hemoglobin A1c noted to be 6.2 on 04/20/2018. -Hypoglycemic protocol -CBGs every 4 hours with very sensitive SSI -Adjust regimen as needed  Dementia -Continue Aricept when able  History of breast cancer: -Continue letrozole  Chronic debility: Patient mostly wheelchair-bound.  On  hospice DNR/DNI: Patient currently on hospice and is to remain  DO NOT RESUSCITATE and DO NOT INTUBATE.     DVT prophylaxis: Eliquis Code Status: DNR/DNI Family Communication: daughter upddated over the phone Disposition Plan: tbd Consults called: None Admission status: Inpatient  Norval Morton MD [facility] Pager [phone]   If 7PM-7AM, please contact night-coverage [email or web address] Password [facility]  09/20/2019, 11:26 AM

## 2019-09-20 NOTE — ED Triage Notes (Signed)
Pt arrived via [facility] from home related tO COPD exacerbation and anxiety. Pt 82% on EMS arrival, EMS placed Pt on 10L non-rebreather. Pt took 2 ativan's and morphine at approximately 0600 prior to arrival; amount of medication taken by Pt unknown.  Hx of Afib and type 2 diabetic.EMS vitals 194/80, HR 110. REsp 18 100% on NRB

## 2019-09-20 NOTE — Progress Notes (Signed)
Lactic Acid drawn at 09/20/19 1530 result of 2.6 called to unit.

## 2019-09-21 LAB — BASIC METABOLIC PANEL
Anion gap: 11 (ref 5–15)
BUN: 19 mg/dL (ref 8–23)
CO2: 32 mmol/L (ref 22–32)
Calcium: 9.3 mg/dL (ref 8.9–10.3)
Chloride: 95 mmol/L — ABNORMAL LOW (ref 98–111)
Creatinine, Ser: 0.85 mg/dL (ref 0.44–1.00)
GFR calc Af Amer: >60 mL/min (ref >60)
GFR calc non Af Amer: >60 mL/min (ref >60)
Glucose, Bld: 240 mg/dL — ABNORMAL HIGH (ref 70–99)
Potassium: 4 mmol/L (ref 3.5–5.1)
Sodium: 138 mmol/L (ref 135–145)

## 2019-09-21 LAB — CBC
HCT: 35.1 % — ABNORMAL LOW (ref 36.0–46.0)
Hemoglobin: 10.6 g/dL — ABNORMAL LOW (ref 12.0–15.0)
MCH: 27.2 pg (ref 26.0–34.0)
MCHC: 30.2 g/dL (ref 30.0–36.0)
MCV: 90 fL (ref 80.0–100.0)
Platelets: 148 10*3/uL — ABNORMAL LOW (ref 150–400)
RBC: 3.9 MIL/uL (ref 3.87–5.11)
RDW: 15 % (ref 11.5–15.5)
WBC: 13.1 10*3/uL — ABNORMAL HIGH (ref 4.0–10.5)
nRBC: 0 % (ref 0.0–0.2)

## 2019-09-21 LAB — GLUCOSE, CAPILLARY
Glucose-Capillary: 172 mg/dL — ABNORMAL HIGH (ref 70–99)
Glucose-Capillary: 241 mg/dL — ABNORMAL HIGH (ref 70–99)
Glucose-Capillary: 311 mg/dL — ABNORMAL HIGH (ref 70–99)
Glucose-Capillary: 306 mg/dL — ABNORMAL HIGH (ref 70–99)

## 2019-09-21 NOTE — Progress Notes (Signed)
PROGRESS NOTE    Patricia Wall  ZOX:[id number] DOB: Jul 14, 1950 DOA: 09/20/2019 PCP: Tracey HarriesBouska, David, MD    Chief Complaint  Patient presents with   COPD    Brief Narrative:  69 year old lady with prior history of COPD, chronic respiratory failure with hypoxia on 4 L of nasal cannula oxygen at baseline, hypertension, diastolic heart failure, coronary artery disease, breast cancer s/p lumpectomy and radiation treatment, type 2 diabetes mellitus presents with shortness of breath for the last 2 days without any relief.  On arrival to ED she was found to be hypoxic requiring BiPAP and ABG shows pH of 7.2, PCO2 of 94 and PO2 of 105.  Patient was admitted for acute respiratory failure secondary to acute COPD exacerbation   Assessment & Plan:   Principal Problem:   Acute on chronic respiratory failure with hypoxia and hypercapnia (HCC) Active Problems:   Essential hypertension   COPD exacerbation (HCC)   PAF (paroxysmal atrial fibrillation) (HCC)   Acute on chronic respiratory failure with hypercapnia (HCC)   DNR (do not resuscitate)   Acute on chronic respiratory failure with hypoxia and hypercapnia secondary to acute COPD exacerbation. Continue with BiPAP and transition to nasal cannula oxygen as appropriate Continue with Solu-Medrol duo nebs and Mucinex and flutter valve.   Paroxysmal atrial fibrillation Rate controlled on metoprolol and Cardizem and on anticoagulation with Eliquis.   Chronic diastolic heart failure Patient appears euvolemic, continue with Lasix and beta-blocker. Continue with strict intake and output and daily weights.   History of coronary artery disease s/p PCI in February 2021. Continue with Plavix, statin and beta-blocker.    Type 2 diabetes mellitus Continue with sliding scale insulin CBG (last 3)  Recent Labs    09/20/19 1550 09/20/19 2125 09/21/19 1229  GLUCAP 211* 254* 172*   Will start on NovoLog 3 units 3 times daily  AC.    Dementia Continue with Aricept.   History of breast cancer continue with letrozole.   Patient is currently on hospice and would like to remain DO NOT RESUSCITATE and DO NOT INTUBATE.   DVT prophylaxis: (Eliquis Code Status: DNR Family Communication: daughter at bedside.  Disposition:   Status is: Inpatient  Remains inpatient appropriate because:IV treatments appropriate due to intensity of illness or inability to take PO   Dispo: The patient is from: Home              Anticipated d/c is to: Home              Anticipated d/c date is: 2 days              Patient currently is not medically stable to d/c.       Consultants:   None.    Procedures: none.   Antimicrobials: none.    Subjective: Reports feeling sob, not back to baseline.   Objective: Vitals:   09/21/19 0729 09/21/19 0908 09/21/19 1139 09/21/19 1228  BP:  (!) 168/67  119/77  Pulse:  (!) 132  (!) 124  Resp:  19  20  Temp:  (!) 97.4 F (36.3 C)  98.2 F (36.8 C)  TempSrc:      SpO2: 95% 94% 94% 91%  Weight:      Height:        Intake/Output Summary (Last 24 hours) at 09/21/2019 1550 Last data filed at 09/21/2019 1247 Gross per 24 hour  Intake --  Output 700 ml  Net -700 ml   [employer] Power[employer]iled Weights   09/20/19  1610  Weight: 68.5 kg    Examination:  General exam: Appears calm and comfortable on 3lit of [place] OXYGEN.  Respiratory system: diminished air entry throughout the lungs.  Cardiovascular system: S1 & S2 heard, tachycardic, no JVD, No pedal edema. Gastrointestinal system: Abdomen is nondistended, soft and nontender. No organomegaly or masses felt. Normal bowel sounds heard. Central nervous system: Alert and oriented. No focal neurological deficits. Extremities: Symmetric 5 x 5 power. Skin: No rashes, lesions or ulcers Psychiatry:  Mood & affect appropriate.     Data Reviewed: I have personally reviewed following labs and imaging studies  CBC: Recent Labs  Lab 09/20/19 0753  09/20/19 0947 09/20/19 1228 09/21/19 0352  WBC 11.0*  --   --  13.1*  NEUTROABS 7.9*  --   --   --   HGB 10.7* 11.6* 11.6* 10.6*  HCT 37.3 34.0* 34.0* 35.1*  MCV 94.0  --   --  90.0  PLT 155  --   --  148*    Basic Metabolic Panel: Recent Labs  Lab 09/20/19 0753 09/20/19 0947 09/20/19 1228 09/21/19 0352  NA 137 137 138 138  K 4.4 4.2 4.0 4.0  CL 94*  --   --  95*  CO2 36*  --   --  32  GLUCOSE 205*  --   --  240*  BUN 12  --   --  19  CREATININE 0.74  --   --  0.85  CALCIUM 9.2  --   --  9.3    GFR: Estimated Creatinine Clearance: 56.8 mL/min (by C-G formula based on SCr of 0.85 mg/dL).  Liver Function Tests: Recent Labs  Lab 09/20/19 0753  AST 19  ALT 20  ALKPHOS 76  BILITOT 0.3  PROT 6.7  ALBUMIN 3.6    CBG: Recent Labs  Lab 09/20/19 0821 09/20/19 1346 09/20/19 1550 09/20/19 2125 09/21/19 1229  GLUCAP 214* 244* 211* 254* 172*     Recent Results (from the past 240 hour(s))  Culture, blood (routine x 2)     Status: None (Preliminary result)   Collection Time: 09/20/19  7:28 AM   Specimen: BLOOD  Result Value Ref Range Status   Specimen Description BLOOD RIGHT ANTECUBITAL  Final   Special Requests   Final    BOTTLES DRAWN AEROBIC AND ANAEROBIC Blood Culture results may not be optimal due to an inadequate volume of blood received in culture bottles   Culture   Final    NO GROWTH 1 DAY Performed at [facility], [address], [place], [place] [address]    Report Status PENDING  Incomplete  SARS Coronavirus 2 by RT PCR (hospital order, performed in [facility] hospital lab) Nasopharyngeal Nasopharyngeal Swab     Status: None   Collection Time: 09/20/19  7:53 AM   Specimen: Nasopharyngeal Swab  Result Value Ref Range Status   SARS Coronavirus 2 NEGATIVE NEGATIVE Final    Comment: (NOTE) SARS-CoV-2 target nucleic acids are NOT DETECTED.  The SARS-CoV-2 RNA is generally detectable in upper and lower respiratory specimens during the acute  phase of infection. The lowest concentration of SARS-CoV-2 viral copies this assay can detect is 250 copies / mL. A negative result does not preclude SARS-CoV-2 infection and should not be used as the sole basis for treatment or other patient management decisions.  A negative result may occur with improper specimen collection / handling, submission of specimen other than nasopharyngeal swab, presence of viral mutation(s) within the  areas targeted by this assay, and inadequate number of viral copies (<250 copies / mL). A negative result must be combined with clinical observations, patient history, and epidemiological information.  Fact Sheet for Patients:   BoilerBrush.com.cy  Fact Sheet for Healthcare Providers: https://pope.com/  This test is not yet approved or  cleared by the [place] FDA and has been authorized for detection and/or diagnosis of SARS-CoV-2 by FDA under an Emergency Use Authorization (EUA).  This EUA will remain in effect (meaning this test can be used) for the duration of the COVID-19 declaration under Section 564(b)(1) of the Act, 21 U.S.C. section 360bbb-3(b)(1), unless the authorization is terminated or revoked sooner.  Performed at [facility], [address], [place], [place] [address]          Radiology Studies: CT Head Wo Contrast  Result Date: 09/20/2019 CLINICAL DATA:  Encephalopathy.  Respiratory distress. EXAM: CT HEAD WITHOUT CONTRAST TECHNIQUE: Contiguous axial images were obtained from the base of the skull through the vertex without intravenous contrast. COMPARISON:  04/26/2019 FINDINGS: Brain: Mild age related volume loss. No old or acute focal infarction, mass lesion, hemorrhage, hydrocephalus or extra-axial collection. Vascular: There is atherosclerotic calcification of the major vessels at the base of the brain. Skull: Negative Sinuses/Orbits: Clear/normal Other: None IMPRESSION: No  acute or significant head CT finding. Mild age related volume loss. Electronically Signed   By: Paulina Fusi M.D.   On: 09/20/2019 10:48   CT ABDOMEN PELVIS W CONTRAST  Result Date: 09/20/2019 CLINICAL DATA:  Abdominal pain. EXAM: CT ABDOMEN AND PELVIS WITH CONTRAST TECHNIQUE: Multidetector CT imaging of the abdomen and pelvis was performed using the standard protocol following bolus administration of intravenous contrast. CONTRAST:  OMNIPAQUE IOHEXOL 300 MG/ML  SOLN COMPARISON:  06/08/2019 FINDINGS: Lower chest: Redemonstration of emphysema but without focal infiltrate, collapse or effusion. Pericardial effusion in left pleural effusion previously seen have resolved. Skin thickening of the left breast again. Hepatobiliary: Previous cholecystectomy. Liver parenchyma appears normal. Pancreas: Normal Spleen: Normal Adrenals/Urinary Tract: Adrenal glands are normal. Kidneys are normal. Bladder is normal. Stomach/Bowel: Stomach and small intestine appear unremarkable. Moderate amount of fecal matter within the colon but without evidence inflammation or frank obstruction. Vascular/Lymphatic: Aortic atherosclerosis. No aneurysm. IVC is normal. No retroperitoneal adenopathy. Reproductive: Previous hysterectomy.  No pelvic mass. Other: No free air or fluid. Musculoskeletal: Ordinary lumbar degenerative changes. Previous vertebral augmentation L1. IMPRESSION: No acute abdominal finding. Large amount stool and gas in the colon which could conceivably be symptomatic. Aortic Atherosclerosis (ICD10-I70.0) and Emphysema (ICD10-J43.9). Skin thickening of the left breast as seen previously. Resolution of previously seen pericardial effusion left pleural effusion. Electronically Signed   By: Paulina Fusi M.D.   On: 09/20/2019 10:51   DG Chest Portable 1 View  Result Date: 09/20/2019 CLINICAL DATA:  Shortness of breath. EXAM: PORTABLE CHEST 1 VIEW COMPARISON:  09/04/2019.  06/16/2019.  06/08/2019.  04/26/2019. FINDINGS:  Mediastinum and hilar structures normal. Heart size normal. Chronic bilateral interstitial prominence. Findings most consistent chronic interstitial lung disease. Superimposed active pneumonitis cannot be excluded. No pleural effusion or pneumothorax. No acute bony abnormality. IMPRESSION: Chronic bilateral interstitial prominence. Findings most consistent chronic interstitial lung disease. Superimposed pneumonitis cannot be excluded. Electronically Signed   By: Maisie Fus  Register   On: 09/20/2019 07:54        Scheduled Meds:  acidophilus  1 capsule Oral BID   apixaban  5 mg Oral BID   atorvastatin  80 mg Oral Daily  clopidogrel  75 mg Oral Q breakfast   diltiazem  120 mg Oral Daily   donepezil  5 mg Oral Daily   furosemide  40 mg Oral QODAY   gabapentin  300 mg Oral TID   insulin aspart  0-9 Units Subcutaneous TID WC   ipratropium-albuterol  3 mL Nebulization QID   letrozole  2.5 mg Oral BH-q7a   methylPREDNISolone (SOLU-MEDROL) injection  60 mg Intravenous Q8H   metoprolol succinate  25 mg Oral Daily   pantoprazole  40 mg Oral Daily   sodium chloride flush  3 mL Intravenous Q12H   Continuous Infusions:   LOS: 1 day        Kathlen Mody, MD [facility]   To contact the attending provider between 7A-7P or the covering provider during after hours 7P-7A, please log into the web site [email or web address] and access using universal [place] password for that web site. If you do not have the password, please call the hospital operator.  09/21/2019, 3:50 PM

## 2019-09-21 NOTE — Plan of Care (Signed)
  Problem: Education: Goal: Knowledge of General Education information will improve Description: Including pain rating scale, medication(s)/side effects and non-pharmacologic comfort measures Outcome: Progressing   Problem: Health Behavior/Discharge Planning: Goal: Ability to manage health-related needs will improve Outcome: Progressing   Problem: Clinical Measurements: Goal: Ability to maintain clinical measurements within normal limits will improve Outcome: Progressing Goal: Will remain free from infection Outcome: Progressing Goal: Diagnostic test results will improve Outcome: Progressing Goal: Respiratory complications will improve Outcome: Progressing Goal: Cardiovascular complication will be avoided Outcome: Progressing   Problem: Activity: Goal: Risk for activity intolerance will decrease Outcome: Progressing   Problem: Nutrition: Goal: Adequate nutrition will be maintained Outcome: Progressing   Problem: Coping: Goal: Level of anxiety will decrease Outcome: Progressing   Problem: Elimination: Goal: Will not experience complications related to bowel motility Outcome: Progressing Goal: Will not experience complications related to urinary retention Outcome: Progressing   Problem: Safety: Goal: Ability to remain free from injury will improve Outcome: Progressing   Problem: Pain Managment: Goal: General experience of comfort will improve Outcome: Progressing   Problem: Education: Goal: Knowledge of disease or condition will improve Outcome: Progressing Goal: Knowledge of the prescribed therapeutic regimen will improve Outcome: Progressing Goal: Individualized Educational Video(s) Outcome: Progressing   Problem: Activity: Goal: Ability to tolerate increased activity will improve Outcome: Progressing Goal: Will verbalize the importance of balancing activity with adequate rest periods Outcome: Progressing   Problem: Respiratory: Goal: Ability to maintain  a clear airway will improve Outcome: Progressing Goal: Levels of oxygenation will improve Outcome: Progressing Goal: Ability to maintain adequate ventilation will improve Outcome: Progressing

## 2019-09-21 NOTE — Progress Notes (Signed)
Pt gets SOB with minimal movement even sitting up in bed.  I do not feel it is safe to send the pt to CT that requires her to lie flat.  Pt is a DNR and under hospice care.

## 2019-09-21 NOTE — Discharge Instructions (Signed)

## 2019-09-22 ENCOUNTER — Inpatient Hospital Stay (HOSPITAL_COMMUNITY): Payer: Medicare Other

## 2019-09-22 DIAGNOSIS — I361 Nonrheumatic tricuspid (valve) insufficiency: Secondary | ICD-10-CM

## 2019-09-22 LAB — CBC
HCT: 34.7 % — ABNORMAL LOW (ref 36.0–46.0)
Hemoglobin: 10.6 g/dL — ABNORMAL LOW (ref 12.0–15.0)
MCH: 27.2 pg (ref 26.0–34.0)
MCHC: 30.5 g/dL (ref 30.0–36.0)
MCV: 89 fL (ref 80.0–100.0)
Platelets: 168 10*3/uL (ref 150–400)
RBC: 3.9 MIL/uL (ref 3.87–5.11)
RDW: 15.6 % — ABNORMAL HIGH (ref 11.5–15.5)
WBC: 16 10*3/uL — ABNORMAL HIGH (ref 4.0–10.5)
nRBC: 0 % (ref 0.0–0.2)

## 2019-09-22 LAB — GLUCOSE, CAPILLARY
Glucose-Capillary: 272 mg/dL — ABNORMAL HIGH (ref 70–99)
Glucose-Capillary: 329 mg/dL — ABNORMAL HIGH (ref 70–99)
Glucose-Capillary: 285 mg/dL — ABNORMAL HIGH (ref 70–99)

## 2019-09-22 LAB — ECHOCARDIOGRAM COMPLETE
Height: 66 in
Weight: 2416 oz

## 2019-09-22 LAB — LACTIC ACID, PLASMA
Lactic Acid, Venous: 3.1 mmol/L (ref 0.5–1.9)
Lactic Acid, Venous: 3.7 mmol/L (ref 0.5–1.9)

## 2019-09-22 MED ORDER — INSULIN ASPART 100 UNIT/ML ~~LOC~~ SOLN
3.0000 [IU] | Freq: Three times a day (TID) | SUBCUTANEOUS | Status: DC
  Administered 2019-09-22 – 2019-09-23 (×5): 3 [IU] via SUBCUTANEOUS

## 2019-09-22 MED ORDER — METHYLPREDNISOLONE SODIUM SUCC 40 MG IJ SOLR
40.0000 mg | Freq: Two times a day (BID) | INTRAMUSCULAR | Status: DC
  Administered 2019-09-22 – 2019-09-23 (×2): 40 mg via INTRAVENOUS
  Filled 2019-09-22 (×3): qty 1

## 2019-09-22 MED ORDER — INSULIN ASPART 100 UNIT/ML ~~LOC~~ SOLN
0.0000 [IU] | Freq: Three times a day (TID) | SUBCUTANEOUS | Status: DC
  Administered 2019-09-22: 11 [IU] via SUBCUTANEOUS
  Administered 2019-09-22: 8 [IU] via SUBCUTANEOUS
  Administered 2019-09-23: 11 [IU] via SUBCUTANEOUS
  Administered 2019-09-23: 15 [IU] via SUBCUTANEOUS
  Administered 2019-09-23: 8 [IU] via SUBCUTANEOUS

## 2019-09-22 NOTE — Progress Notes (Signed)
PROGRESS NOTE    Patricia Wall  KDX:[id number] DOB: 08/30/1950 DOA: 09/20/2019 PCP: Tracey Harries, MD    Chief Complaint  Patient presents with  . COPD    Brief Narrative:  69 year old lady with prior history of COPD, chronic respiratory failure with hypoxia on 4 L of nasal cannula oxygen at baseline, hypertension, diastolic heart failure, coronary artery disease, breast cancer s/p lumpectomy and radiation treatment, type 2 diabetes mellitus presents with shortness of breath for the last 2 days without any relief.  On arrival to ED she was found to be hypoxic requiring BiPAP and ABG shows pH of 7.2, PCO2 of 94 and PO2 of 105.  Patient was admitted for acute respiratory failure secondary to acute COPD exacerbation. As per RN pt had a near syncopal episode yesterday in the bathroom. Pt denies hitting her head. CT head without contrast ordered, but pt and her daughter refused . Pt seen and examined this am. She reports feeling better, breathing better, but not back to baseline yet. SLP evaluation requested, plan for MBS tomorrow while dysphagia diet ordered for today.    Assessment & Plan:   Principal Problem:   Acute on chronic respiratory failure with hypoxia and hypercapnia (HCC) Active Problems:   Essential hypertension   COPD exacerbation (HCC)   PAF (paroxysmal atrial fibrillation) (HCC)   Acute on chronic respiratory failure with hypercapnia (HCC)   DNR (do not resuscitate)   Acute on chronic respiratory failure with hypoxia and hypercapnia secondary to acute COPD exacerbation. BIPAP at night and 4 lit of [place] oxygen during the day. On exam today, air entry improved. Scattered wheezing heard.  Start weaning the steroids, to continue with Robitussin, flutter valve. SLP eval recommending dysphagia 3 diet, plan for MBS tomorrow.   Paroxysmal atrial fibrillation Rate controlled on metoprolol and Cardizem and on anticoagulation with Eliquis. Patient's CHA2DS2-VASc 2 score is  4   Chronic diastolic heart failure  continue with Lasix and beta-blocker. Continue with strict intake and output and daily weights. Last echocardiogram showed LVEF of 60%, with grade 1 diastolic dysfunction.  She appears euvolemic.    History of coronary artery disease s/p PCI in February 2021. Continue with Plavix, statin and beta-blocker. Patient currently denies any chest pain or shortness of breath.    Type 2 diabetes mellitus uncontrolled with hyperglycemia  CBG (last 3)  Recent Labs    09/21/19 1938 09/21/19 2133 09/22/19 1215  GLUCAP 311* 306* 272*   Will start on NovoLog 3 units 3 times daily AC. Change to resistance SSI    Dementia Continue with Aricept.   History of breast cancer continue with letrozole.   Patient is currently on hospice and would like to remain DO NOT RESUSCITATE and DO NOT INTUBATE.   Near syncopal episode yesterday Patient denies hitting her head or completely losing consciousness. CT of the head without contrast ordered but patient and family refused.  Echocardiogram ordered for further evaluation. Orthostatic blood pressure ordered   Anemia of chronic disease Hemoglobin baseline 10-11. Continue to monitor.    Cytosis Probably secondary to steroids.   DVT prophylaxis: (Eliquis Code Status: DNR Family Communication: daughter at bedside.  Disposition:   Status is: Inpatient  Remains inpatient appropriate because:IV treatments appropriate due to intensity of illness or inability to take PO   Dispo: The patient is from: Home              Anticipated d/c is to: Home  Anticipated d/c date is: 2 days              Patient currently is not medically stable to d/c.       Consultants:   None.    Procedures: none.   Antimicrobials: none.    Subjective: Patient reports her breathing is improved but not resolved.  Patient also reports that she has not completely passed out  yesterday.  Objective: Vitals:   09/22/19 0728 09/22/19 0851 09/22/19 1146 09/22/19 1213  BP:  (!) 143/67  (!) 146/71  Pulse:    (!) 121  Resp:    20  Temp:  98.5 F (36.9 C)    TempSrc:  Oral    SpO2: 97% 96% 96% 93%  Weight:      Height:        Intake/Output Summary (Last 24 hours) at 09/22/2019 1411 Last data filed at 09/22/2019 1248 Gross per 24 hour  Intake --  Output 1300 ml  Net -1300 ml   Filed Weights   09/20/19 0714  Weight: 68.5 kg    Examination:  General exam: Alert and comfortable liters of nasal cannula oxygen. Respiratory system: Diminished air entry throughout the lungs but improved when compared to yesterday.,  Tachypnea present, scattered wheezing present. Cardiovascular system: S1-S2 heard, tachycardic, no JVD, no pedal edema Gastrointestinal system: Abdomen is soft, nontender, nondistended, bowel sounds normal Central nervous system: Alert and oriented to person and place, grossly nonfocal Extremities: No pedal edema Skin: No rashes seen Psychiatry: Mood is appropriate    Data Reviewed: I have personally reviewed following labs and imaging studies  CBC: Recent Labs  Lab 09/20/19 0753 09/20/19 0947 09/20/19 1228 09/21/19 0352 09/22/19 1019  WBC 11.0*  --   --  13.1* 16.0*  NEUTROABS 7.9*  --   --   --   --   HGB 10.7* 11.6* 11.6* 10.6* 10.6*  HCT 37.3 34.0* 34.0* 35.1* 34.7*  MCV 94.0  --   --  90.0 89.0  PLT 155  --   --  148* 168    Basic Metabolic Panel: Recent Labs  Lab 09/20/19 0753 09/20/19 0947 09/20/19 1228 09/21/19 0352  NA 137 137 138 138  K 4.4 4.2 4.0 4.0  CL 94*  --   --  95*  CO2 36*  --   --  32  GLUCOSE 205*  --   --  240*  BUN 12  --   --  19  CREATININE 0.74  --   --  0.85  CALCIUM 9.2  --   --  9.3    GFR: Estimated Creatinine Clearance: 56.8 mL/min (by C-G formula based on SCr of 0.85 mg/dL).  Liver Function Tests: Recent Labs  Lab 09/20/19 0753  AST 19  ALT 20  ALKPHOS 76  BILITOT 0.3  PROT 6.7   ALBUMIN 3.6    CBG: Recent Labs  Lab 09/21/19 1229 09/21/19 1618 09/21/19 1938 09/21/19 2133 09/22/19 1215  GLUCAP 172* 241* 311* 306* 272*     Recent Results (from the past 240 hour(s))  Culture, blood (routine x 2)     Status: None (Preliminary result)   Collection Time: 09/20/19  7:28 AM   Specimen: BLOOD  Result Value Ref Range Status   Specimen Description BLOOD RIGHT ANTECUBITAL  Final   Special Requests   Final    BOTTLES DRAWN AEROBIC AND ANAEROBIC Blood Culture results may not be optimal due to an inadequate volume of blood received in culture  bottles   Culture   Final    NO GROWTH 2 DAYS Performed at [facility], [address] [place], [place] [address]    Report Status PENDING  Incomplete  SARS Coronavirus 2 by RT PCR (hospital order, performed in [facility] hospital lab) Nasopharyngeal Nasopharyngeal Swab     Status: None   Collection Time: 09/20/19  7:53 AM   Specimen: Nasopharyngeal Swab  Result Value Ref Range Status   SARS Coronavirus 2 NEGATIVE NEGATIVE Final    Comment: (NOTE) SARS-CoV-2 target nucleic acids are NOT DETECTED.  The SARS-CoV-2 RNA is generally detectable in upper and lower respiratory specimens during the acute phase of infection. The lowest concentration of SARS-CoV-2 viral copies this assay can detect is 250 copies / mL. A negative result does not preclude SARS-CoV-2 infection and should not be used as the sole basis for treatment or other patient management decisions.  A negative result may occur with improper specimen collection / handling, submission of specimen other than nasopharyngeal swab, presence of viral mutation(s) within the areas targeted by this assay, and inadequate number of viral copies (<250 copies / mL). A negative result must be combined with clinical observations, patient history, and epidemiological information.  Fact Sheet for Patients:   BoilerBrush.com.cyhttps://www.fda.gov/media/136312/download  Fact Sheet for  Healthcare Providers: https://pope.com/https://www.fda.gov/media/136313/download  This test is not yet approved or  cleared by the [place] FDA and has been authorized for detection and/or diagnosis of SARS-CoV-2 by FDA under an Emergency Use Authorization (EUA).  This EUA will remain in effect (meaning this test can be used) for the duration of the COVID-19 declaration under Section 564(b)(1) of the Act, 21 U.S.C. section 360bbb-3(b)(1), unless the authorization is terminated or revoked sooner.  Performed at [facility], [address] [place], [place] [address]   Culture, blood (routine x 2)     Status: None (Preliminary result)   Collection Time: 09/20/19 10:44 AM   Specimen: BLOOD RIGHT WRIST  Result Value Ref Range Status   Specimen Description BLOOD RIGHT WRIST  Final   Special Requests   Final    BOTTLES DRAWN AEROBIC AND ANAEROBIC Blood Culture results may not be optimal due to an inadequate volume of blood received in culture bottles   Culture   Final    NO GROWTH 2 DAYS Performed at [facility], [address] [place], [place] [address]    Report Status PENDING  Incomplete         Radiology Studies: No results found.      Scheduled Meds: . acidophilus  1 capsule Oral BID  . apixaban  5 mg Oral BID  . atorvastatin  80 mg Oral Daily  . clopidogrel  75 mg Oral Q breakfast  . diltiazem  120 mg Oral Daily  . donepezil  5 mg Oral Daily  . furosemide  40 mg Oral QODAY  . gabapentin  300 mg Oral TID  . insulin aspart  0-15 Units Subcutaneous TID WC  . insulin aspart  3 Units Subcutaneous TID WC  . ipratropium-albuterol  3 mL Nebulization QID  . letrozole  2.5 mg Oral BH-q7a  . methylPREDNISolone (SOLU-MEDROL) injection  40 mg Intravenous Q12H  . metoprolol succinate  25 mg Oral Daily  . pantoprazole  40 mg Oral Daily  . sodium chloride flush  3 mL Intravenous Q12H   Continuous Infusions:   LOS: 2 days        Kathlen ModyVijaya Jaymason Ledesma, MD [facility]   To contact the attending provider between 7A-7P or the covering provider during after hours 7P-7A, please log into the web site [email or web address] and access using universal [place] password for that web site. If you do not have the password, please call the hospital operator.  09/22/2019, 2:11 PM

## 2019-09-22 NOTE — Consult Note (Signed)
Responded to call from front desk, main hospital entrance, and met w/ pt's daughter [name] at her request. [name] wished to cry and "get things off her chest" before seeing her mom--to be more upbeat to enjoy possibly one of their last times together.   Since Jan., [name] has been her mom's primary caregiver, after taking her mom to live in [place] home with her and  Patricia Wall's disabled husband. Her mom's husband, Patricia Wall's stepdad, lives independently in [place], though [name] says he is like a "5-y-o with a driver's license." Since he has aphasia, [name] believes he should not be driving, and that he understands little about the gravity of his wife's situation (or what hospice is). He keeps asking when she is coming home.  Patricia Wall said she's exhausted from the constant caregiving (seldom getting more than 4 hrs sleep), and has anxiety and is bipolar. She has a counselor she's been seeing to help w/ that. Patricia Wall's husband has been trying to lighten the load at home for her recently, which she appreciates, but it's hard for him to do. Patricia Wall's brother and sister live in [place] too but rarely visit their mom.  [name] has three children, and will ask one of her sons to skype with her mom today. Her mom yesterday told [name] she needed to call everyone, and [name] helped her mom (who is from [place]) skype with her relatives in [place] and [place]. Those seemed upbeat encounters for all. Patricia Wall said they used to skype every week, but now it's more like every day. Patricia Wall believes her mom may pass soon, and. does not want to be with her when she does. She  feels guilty about that, bur does not want to remember her mom at that moment.  Neither Patricia Wall nor her mom are involved with a church. Her mom is Pharmacologist by birth and tradition. She knows [name] will take care of her husband after she passes. Patricia Wall said she will have to figure out how to do that then. Patricia Wall says her mom is aware of what's  going on-- that she is DNR with comfort as a goal, and, though she likely had a mini-stroke, she can't lie flat in the CT scan to confirm that.  Chaplain will visit pt later to offer support. Query if palliative and/or social work consults would be appropriate/helpful now for Patricia Wall's mom: e.g., to offer possible peace of mind if future planning could be done now for her husband's care.  Rev. Eloise Levels Chaplain

## 2019-09-22 NOTE — Evaluation (Signed)
Clinical/Bedside Swallow Evaluation Patient Details  Name: Patricia Wall MRN: [id number] Date of Birth: 1950/06/10  Today's Date: 09/22/2019 Time: SLP Start Time (ACUTE ONLY): 0907 SLP Stop Time (ACUTE ONLY): 0917 SLP Time Calculation (min) (ACUTE ONLY): 10 min  Past Medical History:  Past Medical History:  Diagnosis Date  . Abnormal chest xray   . ABUSE, ALCOHOL, UNSPECIFIED 09/04/2006   Qualifier: Diagnosis of  By: Delrae Alfred MD, Lanora Manis    . Acute diastolic CHF (congestive heart failure) (HCC)   . Acute on chronic respiratory failure with hypercapnia (HCC)   . Acute respiratory failure with hypoxia (HCC) 01/07/2015  . Alcohol abuse   . Angina, class III (HCC) 03/29/2007   Qualifier: Diagnosis of  By: Delrae Alfred MD, Lanora Manis    . ARF (acute renal failure) (HCC) 04/27/2019  . Back pain   . CAD (coronary artery disease)     Prev seen by [facility].  Stent to OM and LAD Last cath 6/11 cathed on June 13, [name]. 2011.  The cardiac catheterization showed 30% and 50% lesions in the mid  and distal LAD, 50% in the OM1, 40% in the OM2 with 20% in-stent  restenosis, 40% in-stent restenosis in the circumflex and no significant  disease in the RCA.  Her EF was normal.  Dr. Juanda Chance recommended a trial  of proton pump inhibitors   . CAD S/P percutaneous coronary angioplasty: Prior Cypher DES to pLAD, pOM2 & AVG Cx (after Om2); New DES to AVG Cx ISR & Angiosculpt PTCA of Ostial AVG Cx from OM2. 09/04/2006   Qualifier: Diagnosis of  By: Delrae Alfred MD, Lanora Manis    . Chest pain   . COPD (chronic obstructive pulmonary disease) (HCC)   . COPD GOLD II / still smoking  09/04/2006   Followed in Pulmonary clinic/ [facility]/ [place]  Active smoker  - pft's 12/12/12 FEV1  1.41 (53%) ratio 54 with no significant reversibility and DLCO 47% corrects to 50 01/16/2013  Walked RA x 2 laps @ 185 ft each stopped due to sob, no desat     - Trial of anoro 01/16/2013 > helped but insurance would not pay - incruse one daily started  10/02/2013 >>>not covered  -Spiriva restarted 10/15/13 >  . Cyanosis   . DDD (degenerative disc disease)   . DEGENERATIVE DISC DISEASE, CERVICAL SPINE 09/04/2006   Annotation: c6-7 on the right Qualifier: Diagnosis of  By: Delrae Alfred MD, Lanora Manis    . Depression   . Diarrhea   . Diverticular disease   . DIVERTICULOSIS, COLON 09/04/2006   Qualifier: Diagnosis of  By: Delrae Alfred MD, Lanora Manis    . GERD (gastroesophageal reflux disease)   . H/O: hysterectomy   . Hemorrhoids   . HTN (hypertension)   . IBS (irritable bowel syndrome)   . Insomnia   . Irritable bowel syndrome 09/04/2006   Qualifier: Diagnosis of  By: Delrae Alfred MD, Lanora Manis    . Lymphadenitis   . PEPTIC ULCER DISEASE 09/04/2006   Annotation: distant history of  Qualifier: Diagnosis of  By: Delrae Alfred MD, Lanora Manis    . Pharyngitis   . PHARYNGITIS 07/18/2007   Qualifier: Diagnosis of  By: Barbaraann Barthel MD, [place]    . PUD (peptic ulcer disease)   . PVD (peripheral vascular disease) (HCC)   . Sinusitis   . TIA (transient ischemic attack)   . Tobacco abuse   . Vertigo    Past Surgical History:  Past Surgical History:  Procedure Laterality Date  . ABDOMINAL HYSTERECTOMY    .  APPENDECTOMY    . BREAST LUMPECTOMY Left 2015  . CARDIAC CATHETERIZATION  2005, 2006, 2009,2011  . CHOLECYSTECTOMY    . CORONARY STENT INTERVENTION N/A 04/22/2019   Procedure: CORONARY STENT INTERVENTION;  Surgeon: Lennette Bihari, MD;  Location: [facility] INVASIVE CV LAB;  Service: Cardiovascular;  Laterality: N/A;  . CORONARY STENT PLACEMENT     Status post balloon angiogram plasty and Cypher stent to the distal    circumflex and OM2 branch  . ESOPHAGOGASTRODUODENOSCOPY (EGD) WITH PROPOFOL Left 04/21/2019   Procedure: ESOPHAGOGASTRODUODENOSCOPY (EGD) WITH PROPOFOL;  Surgeon: Willis Modena, MD;  Location: [facility] ENDOSCOPY;  Service: Endoscopy;  Laterality: Left;  . Hammertoe repair    . Left Bunionectomy    . LEFT HEART CATH AND CORONARY ANGIOGRAPHY N/A 04/22/2019    Procedure: LEFT HEART CATH AND CORONARY ANGIOGRAPHY;  Surgeon: Lennette Bihari, MD;  Location: [facility] INVASIVE CV LAB;  Service: Cardiovascular;  Laterality: N/A;  . LEFT HEART CATHETERIZATION WITH CORONARY ANGIOGRAM N/A 12/03/2013   Procedure: LEFT HEART CATHETERIZATION WITH CORONARY ANGIOGRAM;  Surgeon: Marykay Lex, MD;  Location: [facility] CATH LAB;  Service: Cardiovascular;  Laterality: N/A;  . TUBAL LIGATION     HPI:  69 year old lady with prior history of COPD, chronic respiratory failure with hypoxia on 4 L of nasal cannula oxygen at baseline, hypertension, diastolic heart failure, coronary artery disease, breast cancer s/p lumpectomy and radiation treatment, type 2 diabetes mellitus presents with shortness of breath for the last 2 days without any relief.  On arrival to ED she was found to be hypoxic requiring BiPAP and ABG shows pH of 7.2, PCO2 of 94 and PO2 of 105.  Patient was admitted for acute respiratory failure secondary to acute COPD exacerbation   Assessment / Plan / Recommendation Clinical Impression  Pt presents with hard baseline coughing. She coughed all during her am meal in which only solids were consumed, no liquids yet. Mastication adequate, but hard prolonged coughing prevalent. Pt also coughed after a sip of water, but in further sips, no coughing observed. Instrumental testing is needed to determine if there is any relationship between coughing and swallowing in this pt. Recommend she continue a mechanical soft diet with nectar thick liquids, but she may also have sips of water and coffee, which she tolerated well under observation.  SLP Visit Diagnosis: Dysphagia, unspecified (R13.10)    Aspiration Risk  Mild aspiration risk    Diet Recommendation Dysphagia 3 (Mech soft);Nectar-thick liquid;Thin liquid   Liquid Administration via: Cup Medication Administration: Whole meds with liquid Supervision: Patient able to self feed Compensations: Slow rate;Small sips/bites Postural  Changes: Seated upright at 90 degrees    Other  Recommendations Oral Care Recommendations: Oral care BID   Follow up Recommendations        Frequency and Duration            Prognosis        Swallow Study   General HPI: 69 year old lady with prior history of COPD, chronic respiratory failure with hypoxia on 4 L of nasal cannula oxygen at baseline, hypertension, diastolic heart failure, coronary artery disease, breast cancer s/p lumpectomy and radiation treatment, type 2 diabetes mellitus presents with shortness of breath for the last 2 days without any relief.  On arrival to ED she was found to be hypoxic requiring BiPAP and ABG shows pH of 7.2, PCO2 of 94 and PO2 of 105.  Patient was admitted for acute respiratory failure secondary to acute COPD exacerbation Previous Swallow Assessment: BSE  03/2019 - regular thin, rest breaks due to respiratory status Diet Prior to this Study: Dysphagia 3 (soft);Honey-thick liquids Temperature Spikes Noted: No Respiratory Status: Nasal cannula History of Recent Intubation: No Behavior/Cognition: Alert;Cooperative;Pleasant mood Oral Cavity Assessment: Within Functional Limits Oral Care Completed by SLP: No Oral Cavity - Dentition: Adequate natural dentition Vision: Functional for self-feeding Self-Feeding Abilities: Able to feed self Patient Positioning: Upright in chair Baseline Vocal Quality: Normal Volitional Cough: Strong Volitional Swallow: Able to elicit    Oral/Motor/Sensory Function Overall Oral Motor/Sensory Function: Within functional limits   Ice Chips Ice chips: Not tested   Thin Liquid Thin Liquid: Impaired Presentation: Cup;Straw Pharyngeal  Phase Impairments: Cough - Immediate    Nectar Thick Nectar Thick Liquid: Within functional limits Presentation: Self Fed   Honey Thick Honey Thick Liquid: Not tested   Puree Puree: Impaired Presentation: Spoon;Self Fed Pharyngeal Phase Impairments: Cough - Immediate   Solid      Solid: Impaired Presentation: Self Fed Pharyngeal Phase Impairments: Cough - Immediate     Harlon Ditty, MA CCC-SLP  Acute Rehabilitation Services Pager [phone] Office [phone]  Claudine Mouton 09/22/2019,9:53 AM

## 2019-09-22 NOTE — Consult Note (Signed)
Visited with patient, who was charming, upbeat, and told me of having talked with her sisters on an [place] off the [place], in [place], and in [place] today. She was thankful for the technology that allowed that, had already had  a good visit with her loving daughter Leeroy Bock today (who had told her about her prior visit with me), and is looking forward to her grandson's visit tonight.   Pt is grateful for all [name]'s caregiving. She also described her own husband as like a 10-y-o after his stroke (and that he shouldn't be driving). She said Leeroy Bock had taken her into [name]'s home so she could get some rest, as she simply could not take care of her husband any more. Pt said she wheels her wheelchair into [name]'s room looking out on the neighborhood in the morning, and thanks God for a good day.   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 I let her know she can ask a nurse to request a chaplain to come anytime, and will let day chaplain know to F/U.  Rev. Donnel Saxon Chaplain

## 2019-09-22 NOTE — Progress Notes (Signed)
  Echocardiogram 2D Echocardiogram has been performed.  Patricia Wall F 09/22/2019, 3:44 PM

## 2019-09-22 NOTE — Care Management (Signed)
Spoke w patient and daughter at bedside. Patient is active with [facility]. They paused services prior to admission, but intend to resume/ restart at DC. Patient has DME oxygen (through [employer]) BIPAP, hospital bed, RW, WC, nebulizer at home and available immediately. Daughter plans on providing transport home. Will need to notify [facility] of intent to restart services prior to discharge.

## 2019-09-23 ENCOUNTER — Inpatient Hospital Stay (HOSPITAL_COMMUNITY): Payer: Medicare Other

## 2019-09-23 ENCOUNTER — Encounter (HOSPITAL_COMMUNITY): Payer: Self-pay | Admitting: Internal Medicine

## 2019-09-23 LAB — BASIC METABOLIC PANEL
Anion gap: 10 (ref 5–15)
BUN: 31 mg/dL — ABNORMAL HIGH (ref 8–23)
CO2: 32 mmol/L (ref 22–32)
Calcium: 9.1 mg/dL (ref 8.9–10.3)
Chloride: 98 mmol/L (ref 98–111)
Creatinine, Ser: 0.93 mg/dL (ref 0.44–1.00)
GFR calc Af Amer: >60 mL/min (ref >60)
GFR calc non Af Amer: >60 mL/min (ref >60)
Glucose, Bld: 298 mg/dL — ABNORMAL HIGH (ref 70–99)
Potassium: 3.8 mmol/L (ref 3.5–5.1)
Sodium: 140 mmol/L (ref 135–145)

## 2019-09-23 LAB — CBC
HCT: 32.6 % — ABNORMAL LOW (ref 36.0–46.0)
Hemoglobin: 9.9 g/dL — ABNORMAL LOW (ref 12.0–15.0)
MCH: 27 pg (ref 26.0–34.0)
MCHC: 30.4 g/dL (ref 30.0–36.0)
MCV: 89.1 fL (ref 80.0–100.0)
Platelets: 167 10*3/uL (ref 150–400)
RBC: 3.66 MIL/uL — ABNORMAL LOW (ref 3.87–5.11)
RDW: 15.6 % — ABNORMAL HIGH (ref 11.5–15.5)
WBC: 13.8 10*3/uL — ABNORMAL HIGH (ref 4.0–10.5)
nRBC: 0 % (ref 0.0–0.2)

## 2019-09-23 LAB — GLUCOSE, CAPILLARY
Glucose-Capillary: 345 mg/dL — ABNORMAL HIGH (ref 70–99)
Glucose-Capillary: 290 mg/dL — ABNORMAL HIGH (ref 70–99)
Glucose-Capillary: 364 mg/dL — ABNORMAL HIGH (ref 70–99)

## 2019-09-23 MED ORDER — PREDNISONE 20 MG PO TABS: ORAL_TABLET | ORAL | 0 refills | Status: DC

## 2019-09-23 MED ORDER — ALPRAZOLAM 0.25 MG PO TABS: 0.2500 mg | ORAL_TABLET | Freq: Two times a day (BID) | ORAL | 0 refills | Status: AC | PRN

## 2019-09-23 MED ORDER — ALPRAZOLAM 0.25 MG PO TABS: 0.2500 mg | ORAL_TABLET | Freq: Two times a day (BID) | ORAL | Status: DC | PRN

## 2019-09-23 NOTE — TOC Transition Note (Addendum)
Transition of Care [facility]) - CM/SW Discharge Note   Patient Details  Name: Patricia Wall MRN: [id number] Date of Birth: September 12, 1950  [facility] CM/SW Contact:  Beckie Busing, RN Phone Number:[phone]  09/23/2019, 3:05 PM   Clinical Narrative:  Patient has discharge orders. CM called [facility] to make aware of patient discharging and for the need to resume services.  Spoke with on call nurse who states that the patient was not in their system so there would need to be an admission done by [facility].RN states that she would speak with her supervisor to determine if the patient would be considered an admission. Patients information given to on call nurse and on call RN also made aware that patient has discharge orders for today. Lisa on call nurse returned call and stated that the branch would follow up in the morning.    Final next level of care: Home w Hospice Care Barriers to Discharge: No Barriers Identified   Patient Goals and CMS Choice Patient states their goals for this hospitalization and ongoing recovery are:: Home with daughter      Discharge Placement                       Discharge Plan and Services                DME Arranged: N/A DME Agency: NA         HH Agency: [facility] (notified of resumption of services) Date HH Agency Contacted: 09/23/19 Time HH Agency Contacted: 1504 Representative spoke with at [facility] Agency: Spoke with on call RN @ [phone]  Social Determinants of Health (SDOH) Interventions     Readmission Risk Interventions No flowsheet data found.

## 2019-09-23 NOTE — Progress Notes (Signed)
Modified Barium Swallow Progress Note  Patient Details  Name: Patricia Wall MRN: [id number] Date of Birth: Nov 17, 1950  Today's Date: 09/23/2019  Modified Barium Swallow completed.  Full report located under Chart Review in the Imaging Section.  Brief recommendations include the following:  Clinical Impression  Oropharyngeal phases of swallow were normal without baseline cough prior to MBS. Timing and strength prevented abnormal penetration and aspiration. Flash penetration with thin which is common and not dysfunctional. No difficulty orally transiting pill with timely transit to stomach. Educated pt and daughter to take rest breaks during meals, defer eating/drinking if experiencing coughing episodes. Straws allowed and pills with thin. Upgrade diet texture to regular and liquids to thin. No follow up needed.     Swallow Evaluation Recommendations       SLP Diet Recommendations: Regular solids;Thin liquid   Liquid Administration via: Cup;Straw   Medication Administration: Whole meds with liquid   Supervision: Patient able to self feed   Compensations: Slow rate;Small sips/bites;Other (Comment) (rest break, defer meals if coughing)   Postural Changes: Seated upright at 90 degrees   Oral Care Recommendations: Oral care BID        Patricia Wall 09/23/2019,10:34 AM  Breck Coons Lonell Face.Ed Nurse, children's [phone] Office [phone]

## 2019-09-23 NOTE — Progress Notes (Signed)
Inpatient Diabetes Program Recommendations  AACE/ADA: New Consensus Statement on Inpatient Glycemic Control (2015)  Target Ranges:  Prepandial:   less than 140 mg/dL      Peak postprandial:   less than 180 mg/dL (1-2 hours)      Critically ill patients:  140 - 180 mg/dL   Lab Results  Component Value Date   GLUCAP 290 (H) 09/23/2019   HGBA1C 7.4 (H) 09/20/2019    Review of Glycemic Control Results for Patricia Wall, Patricia Wall (MRN [id number]) as of 09/23/2019 13:31  Ref. Range 09/22/2019 12:15 09/22/2019 16:05 09/22/2019 20:04 09/23/2019 08:50 09/23/2019 12:06  Glucose-Capillary Latest Ref Range: 70 - 99 mg/dL 542 (H) 706 (H) 237 (H) 345 (H) 290 (H)   Diabetes history: DM 2 Outpatient Diabetes medications:  Metformin 1000 mg bid Current orders for Inpatient glycemic control:  Novolog moderate tid with meals Novolog 3 units tid with meals Solumedrol 40 mg IV q 12 hours  Inpatient Diabetes Program Recommendations:    Consider adding Levemir 12 units daily while on steroids.   Thanks  Beryl Meager, RN, BC-ADM Inpatient Diabetes Coordinator Pager [phone] (8a-5p)

## 2019-09-23 NOTE — Discharge Summary (Signed)
Physician Discharge Summary  SRAVYA GRISSOM FVC:[id number] DOB: 03-04-1951 DOA: 09/20/2019  PCP: Bernerd Limbo, MD  Admit date: 09/20/2019 Discharge date: 09/23/2019  Admitted From: Home.  Disposition:  Home Hospice.   Recommendations for Outpatient Follow-up:  Follow up with hospice MD as needed.   Discharge Condition:guarded.  CODE STATUSDNR Diet recommendation: dysphagia 3 diet.   Brief/Interim Summary: 69 year old lady with prior history of COPD, chronic respiratory failure with hypoxia on 4 L of nasal cannula oxygen at baseline, hypertension, diastolic heart failure, coronary artery disease, breast cancer s/p lumpectomy and radiation treatment, type 2 diabetes mellitus presents with shortness of breath for the last 2 days without any relief.  On arrival to ED she was found to be hypoxic requiring BiPAP and ABG shows pH of 7.2, PCO2 of 94 and PO2 of 105.  Patient was admitted for acute respiratory failure secondary to acute COPD exacerbation. As per RN pt had a near syncopal episode yesterday in the bathroom. Pt denies hitting her head. CT head without contrast ordered, but pt and her daughter refused . Pt seen and examined this am. She reports feeling better, breathing better, but not back to baseline yet. SLP evaluation requested,and dysphagia diet ordered.    Discharge Diagnoses:  Principal Problem:   Acute on chronic respiratory failure with hypoxia and hypercapnia (HCC) Active Problems:   Essential hypertension   COPD exacerbation (HCC)   PAF (paroxysmal atrial fibrillation) (HCC)   Acute on chronic respiratory failure with hypercapnia (HCC)   DNR (do not resuscitate)  Acute on chronic respiratory failure with hypoxia and hypercapnia secondary to acute COPD exacerbation. BIPAP at night and 4 lit of [place] oxygen during the day. On exam today, air entry improved. no wheezing heard. Transitioned her to oral steroids, to continue with Robitussin, flutter valve on discharge.  SLP eval  recommending dysphagia 3 diet, .  Paroxysmal atrial fibrillation Rate controlled on metoprolol and Cardizem and on anticoagulation with Eliquis. Patient's CHA2DS2-VASc 2 score is 4   Chronic diastolic heart failure  continue with Lasix and beta-blocker. Continue with strict intake and output and daily weights. Last echocardiogram showed LVEF of 60%, with grade 1 diastolic dysfunction.  She appears euvolemic.    History of coronary artery disease s/p PCI in February 2021. Continue with Plavix, statin and beta-blocker. Patient currently denies any chest pain or shortness of breath.    Type 2 diabetes mellitus uncontrolled with hyperglycemia Resume home meds.     Dementia Continue with Aricept.   History of breast cancer continue with letrozole.   Patient is currently on hospice and would like to remain DO NOT RESUSCITATE and DO NOT INTUBATE.   Near syncopal episode yesterday Patient denies hitting her head or completely losing consciousness. CT of the head without contrast ordered but patient and family refused.  Echocardiogram ordered for further evaluation.    Anemia of chronic disease Hemoglobin baseline 10-11. Continue to monitor.    Cytosis Probably secondary to steroids.    Discharge Instructions  Discharge Instructions    Diet - low sodium heart healthy   Complete by: As directed    Discharge instructions   Complete by: As directed    Follow up with PCP, Hospice MD as needed.   No wound care   Complete by: As directed      Allergies as of 09/23/2019   No Active Allergies     Medication List    STOP taking these medications   budesonide-formoterol 160-4.5 MCG/ACT inhaler Commonly known  as: SYMBICORT     TAKE these medications   acetaminophen 325 MG tablet Commonly known as: TYLENOL Take 2 tablets (650 mg total) by mouth every 4 (four) hours as needed for headache or mild pain.   acidophilus Caps capsule Take 1  capsule by mouth 2 (two) times daily.   albuterol (2.5 MG/3ML) 0.083% nebulizer solution Commonly known as: PROVENTIL USE 1 VIAL IN NEBULIZER EVERY 4 HOURS FOR WHEEZING OR SHORTNESS OF BREATH. What changed:   how much to take  how to take this  when to take this  reasons to take this  additional instructions   albuterol 108 (90 Base) MCG/ACT inhaler Commonly known as: Ventolin HFA Inhale 2 puffs into the lungs every 6 (six) hours as needed for wheezing or shortness of breath. What changed: Another medication with the same name was changed. Make sure you understand how and when to take each.   ALPRAZolam 0.25 MG tablet Commonly known as: XANAX Take 1 tablet (0.25 mg total) by mouth 2 (two) times daily as needed for anxiety.   apixaban 5 MG Tabs tablet Commonly known as: ELIQUIS Take 1 tablet (5 mg total) by mouth 2 (two) times daily.   atorvastatin 80 MG tablet Commonly known as: LIPITOR Take 80 mg by mouth daily.   blood glucose meter kit and supplies Dispense based on patient and insurance preference. Use up to four times daily as directed. (FOR ICD-9 250.00, 250.01). What changed:   how much to take  how to take this  when to take this   calcium carbonate 600 MG Tabs tablet Commonly known as: OS-CAL Take 600 mg by mouth every morning.   clopidogrel 75 MG tablet Commonly known as: PLAVIX Take 1 tablet (75 mg total) by mouth daily with breakfast.   diltiazem 120 MG 24 hr capsule Commonly known as: CARDIZEM CD Take 120 mg by mouth daily.   donepezil 5 MG tablet Commonly known as: ARICEPT Take 5 mg by mouth daily.   DULoxetine 60 MG capsule Commonly known as: CYMBALTA Take 60 mg by mouth every morning.   Flutter Devi Use as directed What changed:   how much to take  how to take this  when to take this  additional instructions   freestyle lancets Use as instructed What changed:   how much to take  how to take this  when to take  this  additional instructions   furosemide 40 MG tablet Commonly known as: LASIX Take 40 mg by mouth every other day.   gabapentin 300 MG capsule Commonly known as: NEURONTIN Take 300 mg by mouth 3 (three) times daily.   ipratropium-albuterol 0.5-2.5 (3) MG/3ML Soln Commonly known as: DUONEB Take 3 mLs by nebulization in the morning, at noon, in the evening, and at bedtime.   letrozole 2.5 MG tablet Commonly known as: FEMARA Take 2.5 mg by mouth every morning.   lidocaine 5 % Commonly known as: LIDODERM Place 1 patch onto the skin as needed (pain).   metFORMIN 1000 MG tablet Commonly known as: GLUCOPHAGE Take 1,000 mg by mouth 2 (two) times daily.   methocarbamol 750 MG tablet Commonly known as: ROBAXIN Take 750 mg by mouth every 8 (eight) hours as needed for muscle spasms.   metoprolol succinate 25 MG 24 hr tablet Commonly known as: TOPROL-XL Take 25 mg by mouth daily.   morphine 20 MG/5ML solution Take 0.03 mg by mouth See admin instructions. Taking 0.70m every 2 hours as needed for pain  multivitamin tablet Take 1 tablet by mouth daily.   nitroGLYCERIN 0.4 MG SL tablet Commonly known as: NITROSTAT Place 1 tablet (0.4 mg total) under the tongue every 5 (five) minutes as needed for chest pain (may repeat x3).   OXYGEN 1 each by Other route See admin instructions. Use 4L 24/7   pantoprazole 40 MG tablet Commonly known as: PROTONIX Take 40 mg by mouth daily.   potassium chloride SA 20 MEQ tablet Commonly known as: Klor-Con M20 Take 1 tablet (20 mEq total) by mouth daily.   predniSONE 20 MG tablet Commonly known as: Deltasone Prednisone 60 mg daily for 3 days followed by  Prednisone 40 mg daily for 3 days followed by  Prednisone 20 mg daily for  3 days followed by  Prednisone 10 mg daily for 3 days. What changed:   medication strength  additional instructions   Tiotropium Bromide Monohydrate 2.5 MCG/ACT Aers Commonly known as: Spiriva  Respimat Inhale 2 puffs into the lungs daily.   Vitamin D 50 MCG (2000 UT) tablet Take 2,000 Units by mouth every morning.       Follow-up Information    Bernerd Limbo, MD. Schedule an appointment as soon as possible for a visit in 1 week(s).   Specialty: Family Medicine Contact information: [address] [place] [address] [phone]        Josue Hector, MD .   Specialty: Cardiology Contact information: [phone] [address] [phone]              No Active Allergies  Consultations: None.    Procedures/Studies: CT Head Wo Contrast  Result Date: 09/20/2019 CLINICAL DATA:  Encephalopathy.  Respiratory distress. EXAM: CT HEAD WITHOUT CONTRAST TECHNIQUE: Contiguous axial images were obtained from the base of the skull through the vertex without intravenous contrast. COMPARISON:  04/26/2019 FINDINGS: Brain: Mild age related volume loss. No old or acute focal infarction, mass lesion, hemorrhage, hydrocephalus or extra-axial collection. Vascular: There is atherosclerotic calcification of the major vessels at the base of the brain. Skull: Negative Sinuses/Orbits: Clear/normal Other: None IMPRESSION: No acute or significant head CT finding. Mild age related volume loss. Electronically Signed   By: Nelson Chimes M.D.   On: 09/20/2019 10:48   CT ABDOMEN PELVIS W CONTRAST  Result Date: 09/20/2019 CLINICAL DATA:  Abdominal pain. EXAM: CT ABDOMEN AND PELVIS WITH CONTRAST TECHNIQUE: Multidetector CT imaging of the abdomen and pelvis was performed using the standard protocol following bolus administration of intravenous contrast. CONTRAST:  175m OMNIPAQUE IOHEXOL 300 MG/ML  SOLN COMPARISON:  06/08/2019 FINDINGS: Lower chest: Redemonstration of emphysema but without focal infiltrate, collapse or effusion. Pericardial effusion in left pleural effusion previously seen have resolved. Skin thickening of the left breast again.  Hepatobiliary: Previous cholecystectomy. Liver parenchyma appears normal. Pancreas: Normal Spleen: Normal Adrenals/Urinary Tract: Adrenal glands are normal. Kidneys are normal. Bladder is normal. Stomach/Bowel: Stomach and small intestine appear unremarkable. Moderate amount of fecal matter within the colon but without evidence inflammation or frank obstruction. Vascular/Lymphatic: Aortic atherosclerosis. No aneurysm. IVC is normal. No retroperitoneal adenopathy. Reproductive: Previous hysterectomy.  No pelvic mass. Other: No free air or fluid. Musculoskeletal: Ordinary lumbar degenerative changes. Previous vertebral augmentation L1. IMPRESSION: No acute abdominal finding. Large amount stool and gas in the colon which could conceivably be symptomatic. Aortic Atherosclerosis (ICD10-I70.0) and Emphysema (ICD10-J43.9). Skin thickening of the left breast as seen previously. Resolution of previously seen pericardial effusion left pleural effusion. Electronically Signed  By: Nelson Chimes M.D.   On: 09/20/2019 10:51   DG Chest Portable 1 View  Result Date: 09/20/2019 CLINICAL DATA:  Shortness of breath. EXAM: PORTABLE CHEST 1 VIEW COMPARISON:  09/04/2019.  06/16/2019.  06/08/2019.  04/26/2019. FINDINGS: Mediastinum and hilar structures normal. Heart size normal. Chronic bilateral interstitial prominence. Findings most consistent chronic interstitial lung disease. Superimposed active pneumonitis cannot be excluded. No pleural effusion or pneumothorax. No acute bony abnormality. IMPRESSION: Chronic bilateral interstitial prominence. Findings most consistent chronic interstitial lung disease. Superimposed pneumonitis cannot be excluded. Electronically Signed   By: Marcello Moores  Register   On: 09/20/2019 07:54   DG Chest Portable 1 View  Result Date: 09/04/2019 CLINICAL DATA:  dyspnea EXAM: PORTABLE CHEST 1 VIEW COMPARISON:  Chest radiograph 06/16/2019 FINDINGS: The heart size and mediastinal contours are within normal  limits. Lungs are hyperinflated. Chronic bilateral coarse interstitial markings. No new focal infiltrate. No pneumothorax or significant pleural effusion. The visualized skeletal structures are unremarkable. IMPRESSION: No acute cardiopulmonary finding. Electronically Signed   By: Audie Pinto M.D.   On: 09/04/2019 09:58   DG Swallowing Func-Speech Pathology  Result Date: 09/23/2019 Objective Swallowing Evaluation: Type of Study: MBS-Modified Barium Swallow Study  Patient Details Name: IRASEMA CHALK MRN: [id number] Date of Birth: 15-Apr-1950 Today's Date: 09/23/2019 Time: SLP Start Time (ACUTE ONLY): 0948 -SLP Stop Time (ACUTE ONLY): 1000 SLP Time Calculation (min) (ACUTE ONLY): 12 min Past Medical History: Past Medical History: Diagnosis Date . Abnormal chest xray  . ABUSE, ALCOHOL, UNSPECIFIED 09/04/2006  Qualifier: Diagnosis of  By: Amil Amen MD, Benjamine Mola   . Acute diastolic CHF (congestive heart failure) ([place])  . Acute on chronic respiratory failure with hypercapnia ([place])  . Acute respiratory failure with hypoxia ([place]) 01/07/2015 . Alcohol abuse  . Angina, class III ([place]) 03/29/2007  Qualifier: Diagnosis of  By: Amil Amen MD, Benjamine Mola   . ARF (acute renal failure) ([place]) 04/27/2019 . Back pain  . CAD (coronary artery disease)    Prev seen by [facility].  Stent to OM and LAD Last cath 6/11 cathed on June 13, [name]. 2011.  The cardiac catheterization showed 30% and 50% lesions in the mid  and distal LAD, 50% in the OM1, 40% in the OM2 with 20% in-stent  restenosis, 40% in-stent restenosis in the circumflex and no significant  disease in the RCA.  Her EF was normal.  Dr. Olevia Perches recommended a trial  of proton pump inhibitors  . CAD S/P percutaneous coronary angioplasty: Prior Cypher DES to pLAD, pOM2 & AVG Cx (after Om2); New DES to AVG Cx ISR & Angiosculpt PTCA of Ostial AVG Cx from OM2. 09/04/2006  Qualifier: Diagnosis of  By: Amil Amen MD, Benjamine Mola   . Chest pain  . COPD (chronic obstructive pulmonary disease) ([place])  .  COPD GOLD II / still smoking  09/04/2006  Followed in Pulmonary clinic/ [facility]/ [place]  Active smoker  - pft's 12/12/12 FEV1  1.41 (53%) ratio 54 with no significant reversibility and DLCO 47% corrects to 50 01/16/2013  Walked RA x 2 laps @ 185 ft each stopped due to sob, no desat     - Trial of anoro 01/16/2013 > helped but insurance would not pay - incruse one daily started 10/02/2013 >>>not covered  -Spiriva restarted 10/15/13 > . Cyanosis  . DDD (degenerative disc disease)  . DEGENERATIVE DISC DISEASE, CERVICAL SPINE 09/04/2006  Annotation: c6-7 on the right Qualifier: Diagnosis of  By: Amil Amen MD, Benjamine Mola   . Depression  . Diarrhea  .  Diverticular disease  . DIVERTICULOSIS, COLON 09/04/2006  Qualifier: Diagnosis of  By: Amil Amen MD, Benjamine Mola   . GERD (gastroesophageal reflux disease)  . H/O: hysterectomy  . Hemorrhoids  . HTN (hypertension)  . IBS (irritable bowel syndrome)  . Insomnia  . Irritable bowel syndrome 09/04/2006  Qualifier: Diagnosis of  By: Amil Amen MD, Benjamine Mola   . Lymphadenitis  . PEPTIC ULCER DISEASE 09/04/2006  Annotation: distant history of  Qualifier: Diagnosis of  By: Amil Amen MD, Benjamine Mola   . Pharyngitis  . PHARYNGITIS 07/18/2007  Qualifier: Diagnosis of  By: Radene Ou MD, [place]   . PUD (peptic ulcer disease)  . PVD (peripheral vascular disease) ([place])  . Sinusitis  . TIA (transient ischemic attack)  . Tobacco abuse  . Vertigo  Past Surgical History: Past Surgical History: Procedure Laterality Date . ABDOMINAL HYSTERECTOMY   . APPENDECTOMY   . BREAST LUMPECTOMY Left 2015 . CARDIAC CATHETERIZATION  2005, 2006, 2009,2011 . CHOLECYSTECTOMY   . CORONARY STENT INTERVENTION N/A 04/22/2019  Procedure: CORONARY STENT INTERVENTION;  Surgeon: Troy Sine, MD;  Location: [facility];  Service: Cardiovascular;  Laterality: N/A; . CORONARY STENT PLACEMENT    Status post balloon angiogram plasty and Cypher stent to the distal    circumflex and OM2 branch . ESOPHAGOGASTRODUODENOSCOPY (EGD)  WITH PROPOFOL Left 04/21/2019  Procedure: ESOPHAGOGASTRODUODENOSCOPY (EGD) WITH PROPOFOL;  Surgeon: Arta Silence, MD;  Location: [facility];  Service: Endoscopy;  Laterality: Left; . Hammertoe repair   . Left Bunionectomy   . LEFT HEART CATH AND CORONARY ANGIOGRAPHY N/A 04/22/2019  Procedure: LEFT HEART CATH AND CORONARY ANGIOGRAPHY;  Surgeon: Troy Sine, MD;  Location: [facility];  Service: Cardiovascular;  Laterality: N/A; . LEFT HEART CATHETERIZATION WITH CORONARY ANGIOGRAM N/A 12/03/2013  Procedure: LEFT HEART CATHETERIZATION WITH CORONARY ANGIOGRAM;  Surgeon: Leonie Man, MD;  Location: [facility] C[facility];  Service: Cardiovascular;  Laterality: N/A; . TUBAL LIGATION   HPI: 68 year old lady with prior history of COPD, chronic respiratory failure with hypoxia on 4 L of nasal cannula oxygen at baseline, hypertension, diastolic heart failure, coronary artery disease, breast cancer s/p lumpectomy and radiation treatment, type 2 diabetes mellitus presents with shortness of breath for the last 2 days without any relief.  On arrival to ED she was found to be hypoxic requiring BiPAP and ABG shows pH of 7.2, PCO2 of 94 and PO2 of 105.  Patient was admitted for acute respiratory failure secondary to acute COPD exacerbation  No data recorded Assessment / Plan / Recommendation CHL IP CLINICAL IMPRESSIONS 09/23/2019 Clinical Impression Oropharyngeal phases of swallow were normal without baseline cough prior to MBS. Timing and strength prevented abnormal penetration and aspiration. Flash penetration with thin which is common and not dysfunctional. No difficulty orally transiting pill with timely transit to stomach. Educated pt and daughter to take rest breaks during meals, defer eating/drinking if experiencing coughing episodes. Straws allowed and pills with thin. Upgrade diet texture to regular and liquids to thin. No follow up needed.   SLP Visit Diagnosis Dysphagia, unspecified (R13.10) Attention and concentration  deficit following -- Frontal lobe and executive function deficit following -- Impact on safety and function Mild aspiration risk   CHL IP TREATMENT RECOMMENDATION 09/23/2019 Treatment Recommendations No treatment recommended at this time   Prognosis 04/19/2019 Prognosis for Safe Diet Advancement Good Barriers to Reach Goals -- Barriers/Prognosis Comment -- CHL IP DIET RECOMMENDATION 09/23/2019 SLP Diet Recommendations Regular solids;Thin liquid Liquid Administration via Cup;Straw Medication Administration Whole meds with liquid Compensations  Slow rate;Small sips/bites;Other (Comment) Postural Changes Seated upright at 90 degrees   CHL IP OTHER RECOMMENDATIONS 09/23/2019 Recommended Consults -- Oral Care Recommendations Oral care BID Other Recommendations --   CHL IP FOLLOW UP RECOMMENDATIONS 09/23/2019 Follow up Recommendations None   CHL IP FREQUENCY AND DURATION 04/19/2019 Speech Therapy Frequency (ACUTE ONLY) min 2x/week Treatment Duration 2 weeks      CHL IP ORAL PHASE 09/23/2019 Oral Phase WFL Oral - Pudding Teaspoon -- Oral - Pudding Cup -- Oral - Honey Teaspoon -- Oral - Honey Cup -- Oral - Nectar Teaspoon -- Oral - Nectar Cup -- Oral - Nectar Straw -- Oral - Thin Teaspoon -- Oral - Thin Cup -- Oral - Thin Straw -- Oral - Puree -- Oral - Mech Soft -- Oral - Regular -- Oral - Multi-Consistency -- Oral - Pill -- Oral Phase - Comment --  CHL IP PHARYNGEAL PHASE 09/23/2019 Pharyngeal Phase WFL Pharyngeal- Pudding Teaspoon -- Pharyngeal -- Pharyngeal- Pudding Cup -- Pharyngeal -- Pharyngeal- Honey Teaspoon -- Pharyngeal -- Pharyngeal- Honey Cup -- Pharyngeal -- Pharyngeal- Nectar Teaspoon -- Pharyngeal -- Pharyngeal- Nectar Cup -- Pharyngeal -- Pharyngeal- Nectar Straw -- Pharyngeal -- Pharyngeal- Thin Teaspoon -- Pharyngeal -- Pharyngeal- Thin Cup -- Pharyngeal -- Pharyngeal- Thin Straw -- Pharyngeal -- Pharyngeal- Puree -- Pharyngeal -- Pharyngeal- Mechanical Soft -- Pharyngeal -- Pharyngeal- Regular -- Pharyngeal --  Pharyngeal- Multi-consistency -- Pharyngeal -- Pharyngeal- Pill -- Pharyngeal -- Pharyngeal Comment --  CHL IP CERVICAL ESOPHAGEAL PHASE 09/23/2019 Cervical Esophageal Phase WFL Pudding Teaspoon -- Pudding Cup -- Honey Teaspoon -- Honey Cup -- Nectar Teaspoon -- Nectar Cup -- Nectar Straw -- Thin Teaspoon -- Thin Cup -- Thin Straw -- Puree -- Mechanical Soft -- Regular -- Multi-consistency -- Pill -- Cervical Esophageal Comment -- [place] Siren 09/23/2019, 10:34 AM Orbie Pyo Colvin Caroli.Ed Actor Pager [phone] Office [phone]              ECHOCARDIOGRAM COMPLETE  Result Date: 09/22/2019    ECHOCARDIOGRAM REPORT   Patient Name:   CHARDAY CAPETILLO Date of Exam: 09/22/2019 Medical Rec #:  [id number]       Height:       66.0 in Accession #:    [id number]      Weight:       151.0 lb Date of Birth:  1950/12/15       BSA:          1.775 m Patient Age:    24 years        BP:           146/71 mmHg Patient Gender: F               HR:           108 bpm. Exam Location:  Inpatient Procedure: 2D Echo, Cardiac Doppler and Color Doppler Indications:    R55 Syncope  History:        Patient has prior history of Echocardiogram examinations, most                 recent 04/19/2019.  Sonographer:    Merrie Roof RDCS Referring Phys: [place]  1. Left ventricular ejection fraction, by estimation, is 60 to 65%. The left ventricle has normal function. The left ventricle has no regional wall motion abnormalities. Left ventricular diastolic parameters were normal.  2. Right ventricular systolic function is low normal. The right ventricular size is mildly enlarged. There is mildly elevated pulmonary  artery systolic pressure. The estimated right ventricular systolic pressure is 57.8 mmHg.  3. Left atrial size was upper normal.  4. The mitral valve is grossly normal, mild annular calcification. Trivial mitral valve regurgitation.  5. The aortic valve is tricuspid. Aortic valve regurgitation is  not visualized. Mild aortic valve sclerosis is present, with no evidence of aortic valve stenosis.  6. The inferior vena cava is normal in size with greater than 50% respiratory variability, suggesting right atrial pressure of 3 mmHg. FINDINGS  Left Ventricle: Left ventricular ejection fraction, by estimation, is 60 to 65%. The left ventricle has normal function. The left ventricle has no regional wall motion abnormalities. The left ventricular internal cavity size was normal in size. There is  no left ventricular hypertrophy. Left ventricular diastolic parameters were normal. Right Ventricle: The right ventricular size is mildly enlarged. No increase in right ventricular wall thickness. Right ventricular systolic function is low normal. There is mildly elevated pulmonary artery systolic pressure. The tricuspid regurgitant velocity is 2.99 m/s, and with an assumed right atrial pressure of 3 mmHg, the estimated right ventricular systolic pressure is 46.9 mmHg. Left Atrium: Left atrial size was upper normal. Right Atrium: Right atrial size was normal in size. Pericardium: There is no evidence of pericardial effusion. Mitral Valve: The mitral valve is grossly normal. Mild mitral annular calcification. Trivial mitral valve regurgitation. Tricuspid Valve: The tricuspid valve is grossly normal. Tricuspid valve regurgitation is mild. Aortic Valve: The aortic valve is tricuspid. Aortic valve regurgitation is not visualized. Mild aortic valve sclerosis is present, with no evidence of aortic valve stenosis. Mild aortic valve annular calcification. Pulmonic Valve: The pulmonic valve was not well visualized. Pulmonic valve regurgitation is not visualized. Aorta: The aortic root is normal in size and structure. Venous: The inferior vena cava is normal in size with greater than 50% respiratory variability, suggesting right atrial pressure of 3 mmHg. IAS/Shunts: No atrial level shunt detected by color flow Doppler.  LEFT VENTRICLE  PLAX 2D LVIDd:         4.10 cm     Diastology LVIDs:         2.80 cm     LV e' lateral:   8.70 cm/s LV PW:         0.90 cm     LV E/e' lateral: 11.0 LV IVS:        0.80 cm     LV e' medial:    9.46 cm/s LVOT diam:     2.00 cm     LV E/e' medial:  10.1 LV SV:         67 LV SV Index:   38 LVOT Area:     3.14 cm  LV Volumes (MOD) LV vol d, MOD A2C: 66.8 ml LV vol d, MOD A4C: 83.3 ml LV vol s, MOD A2C: 30.2 ml LV vol s, MOD A4C: 33.7 ml LV SV MOD A2C:     36.6 ml LV SV MOD A4C:     83.3 ml LV SV MOD BP:      45.1 ml RIGHT VENTRICLE             IVC RV Basal diam:  3.60 cm     IVC diam: 1.80 cm RV S prime:     11.20 cm/s TAPSE (M-mode): 1.8 cm LEFT ATRIUM             Index       RIGHT ATRIUM  Index LA diam:        3.90 cm 2.20 cm/m  RA Area:     18.50 cm LA Vol (A2C):   55.9 ml 31.50 ml/m RA Volume:   58.60 ml  33.02 ml/m LA Vol (A4C):   49.4 ml 27.84 ml/m LA Biplane Vol: 54.5 ml 30.71 ml/m  AORTIC VALVE LVOT Vmax:   106.00 cm/s LVOT Vmean:  75.200 cm/s LVOT VTI:    0.212 m  AORTA Ao Root diam: 3.00 cm MITRAL VALVE                TRICUSPID VALVE MV Area (PHT): 6.37 cm     TR Peak grad:   35.8 mmHg MV Decel Time: 119 msec     TR Vmax:        299.00 cm/s MV E velocity: 95.50 cm/s MV A velocity: 121.00 cm/s  SHUNTS MV E/A ratio:  0.79         Systemic VTI:  0.21 m                             Systemic Diam: 2.00 cm Rozann Lesches MD Electronically signed by Rozann Lesches MD Signature Date/Time: 09/22/2019/4:53:11 PM    Final        Subjective: Wants to go home , no new complaints.   Discharge Exam: Vitals:   09/23/19 1527 09/23/19 1548  BP:  134/70  Pulse: 93 97  Resp: 16 19  Temp:  98 F (36.7 C)  SpO2: 96% 98%   Vitals:   09/23/19 1146 09/23/19 1208 09/23/19 1527 09/23/19 1548  BP:  (!) 145/64  134/70  Pulse: 98 98 93 97  Resp: 18  16 19   Temp:  98.4 F (36.9 C)  98 F (36.7 C)  TempSrc:      SpO2: 98% 98% 96% 98%  Weight:      Height:        General: Pt is alert, awake,  not in acute distress Cardiovascular: RRR, S1/S2 +, no rubs, no gallops Respiratory: CTA bilaterally, no wheezing, no rhonchi Abdominal: Soft, NT, ND, bowel sounds + Extremities: no edema, no cyanosis    The results of significant diagnostics from this hospitalization (including imaging, microbiology, ancillary and laboratory) are listed below for reference.     Microbiology: Recent Results (from the past 240 hour(s))  Culture, blood (routine x 2)     Status: None (Preliminary result)   Collection Time: 09/20/19  7:28 AM   Specimen: BLOOD  Result Value Ref Range Status   Specimen Description BLOOD RIGHT ANTECUBITAL  Final   Special Requests   Final    BOTTLES DRAWN AEROBIC AND ANAEROBIC Blood Culture results may not be optimal due to an inadequate volume of blood received in culture bottles   Culture   Final    NO GROWTH 3 DAYS Performed at [facility], [place] [address] [place], [place] [address]    Report Status PENDING  Incomplete  SARS Coronavirus 2 by RT PCR (hospital order, performed in [facility] hospital lab) Nasopharyngeal Nasopharyngeal Swab     Status: None   Collection Time: 09/20/19  7:53 AM   Specimen: Nasopharyngeal Swab  Result Value Ref Range Status   SARS Coronavirus 2 NEGATIVE NEGATIVE Final    Comment: (NOTE) SARS-CoV-2 target nucleic acids are NOT DETECTED.  The SARS-CoV-2 RNA is generally detectable in upper and lower respiratory specimens during the acute phase of  infection. The lowest concentration of SARS-CoV-2 viral copies this assay can detect is 250 copies / mL. A negative result does not preclude SARS-CoV-2 infection and should not be used as the sole basis for treatment or other patient management decisions.  A negative result may occur with improper specimen collection / handling, submission of specimen other than nasopharyngeal swab, presence of viral mutation(s) within the areas targeted by this assay, and inadequate number of viral  copies (<250 copies / mL). A negative result must be combined with clinical observations, patient history, and epidemiological information.  Fact Sheet for Patients:   StrictlyIdeas.no  Fact Sheet for Healthcare Providers: BankingDealers.co.za  This test is not yet approved or  cleared by the [place] FDA and has been authorized for detection and/or diagnosis of SARS-CoV-2 by FDA under an Emergency Use Authorization (EUA).  This EUA will remain in effect (meaning this test can be used) for the duration of the COVID-19 declaration under Section 564(b)(1) of the Act, 21 U.S.C. section 360bbb-3(b)(1), unless the authorization is terminated or revoked sooner.  Performed at [facility], [place] [address] [place], [place] [address]   Culture, blood (routine x 2)     Status: None (Preliminary result)   Collection Time: 09/20/19 10:44 AM   Specimen: BLOOD RIGHT WRIST  Result Value Ref Range Status   Specimen Description BLOOD RIGHT WRIST  Final   Special Requests   Final    BOTTLES DRAWN AEROBIC AND ANAEROBIC Blood Culture results may not be optimal due to an inadequate volume of blood received in culture bottles   Culture   Final    NO GROWTH 3 DAYS Performed at [facility], [place] [address], [place], [place] [address]    Report Status PENDING  Incomplete     Labs: BNP (last 3 results) Recent Labs    06/16/19 1321 09/04/19 0935 09/20/19 0753  BNP 101.4* 41.2 09.8   Basic Metabolic Panel: Recent Labs  Lab 09/20/19 0753 09/20/19 0947 09/20/19 1228 09/21/19 0352 09/23/19 0423  NA 137 137 138 138 140  K 4.4 4.2 4.0 4.0 3.8  CL 94*  --   --  95* 98  CO2 36*  --   --  32 32  GLUCOSE 205*  --   --  240* 298*  BUN 12  --   --  19 31*  CREATININE 0.74  --   --  0.85 0.93  CALCIUM 9.2  --   --  9.3 9.1   Liver Function Tests: Recent Labs  Lab 09/20/19 0753  AST 19  ALT 20  ALKPHOS 76  BILITOT 0.3  PROT  6.7  ALBUMIN 3.6   Recent Labs  Lab 09/20/19 0753  LIPASE 26   Recent Labs  Lab 09/20/19 0753  AMMONIA 16   CBC: Recent Labs  Lab 09/20/19 0753 09/20/19 0753 09/20/19 0947 09/20/19 1228 09/21/19 0352 09/22/19 1019 09/23/19 0423  WBC 11.0*  --   --   --  13.1* 16.0* 13.8*  NEUTROABS 7.9*  --   --   --   --   --   --   HGB 10.7*   < > 11.6* 11.6* 10.6* 10.6* 9.9*  HCT 37.3   < > 34.0* 34.0* 35.1* 34.7* 32.6*  MCV 94.0  --   --   --  90.0 89.0 89.1  PLT 155  --   --   --  148* 168 167   < > = values in  this interval not displayed.   Cardiac Enzymes: No results for input(s): CKTOTAL, CKMB, CKMBINDEX, TROPONINI in the last 168 hours. BNP: Invalid input(s): POCBNP CBG: Recent Labs  Lab 09/22/19 1605 09/22/19 2004 09/23/19 0850 09/23/19 1206 09/23/19 1550  GLUCAP 329* 285* 345* 290* 364*   D-Dimer No results for input(s): DDIMER in the last 72 hours. Hgb A1c No results for input(s): HGBA1C in the last 72 hours. Lipid Profile No results for input(s): CHOL, HDL, LDLCALC, TRIG, CHOLHDL, LDLDIRECT in the last 72 hours. Thyroid function studies No results for input(s): TSH, T4TOTAL, T3FREE, THYROIDAB in the last 72 hours.  Invalid input(s): FREET3 Anemia work up No results for input(s): VITAMINB12, FOLATE, FERRITIN, TIBC, IRON, RETICCTPCT in the last 72 hours. Urinalysis    Component Value Date/Time   COLORURINE YELLOW 06/08/2019 [place] 06/08/2019 0613   LABSPEC 1.018 06/08/2019 0613   PHURINE 5.0 06/08/2019 0613   GLUCOSEU NEGATIVE 06/08/2019 0613   HGBUR NEGATIVE 06/08/2019 0613   BILIRUBINUR NEGATIVE 06/08/2019 0613   KETONESUR NEGATIVE 06/08/2019 0613   PROTEINUR 30 (A) 06/08/2019 0613   UROBILINOGEN 1.0 10/10/2009 2052   NITRITE NEGATIVE 06/08/2019 0613   LEUKOCYTESUR NEGATIVE 06/08/2019 1610   Sepsis Labs Invalid input(s): PROCALCITONIN,  WBC,  LACTICIDVEN Microbiology Recent Results (from the past 240 hour(s))  Culture, blood  (routine x 2)     Status: None (Preliminary result)   Collection Time: 09/20/19  7:28 AM   Specimen: BLOOD  Result Value Ref Range Status   Specimen Description BLOOD RIGHT ANTECUBITAL  Final   Special Requests   Final    BOTTLES DRAWN AEROBIC AND ANAEROBIC Blood Culture results may not be optimal due to an inadequate volume of blood received in culture bottles   Culture   Final    NO GROWTH 3 DAYS Performed at [facility], [place] [address] [place], [place] [address]    Report Status PENDING  Incomplete  SARS Coronavirus 2 by RT PCR (hospital order, performed in [facility] hospital lab) Nasopharyngeal Nasopharyngeal Swab     Status: None   Collection Time: 09/20/19  7:53 AM   Specimen: Nasopharyngeal Swab  Result Value Ref Range Status   SARS Coronavirus 2 NEGATIVE NEGATIVE Final    Comment: (NOTE) SARS-CoV-2 target nucleic acids are NOT DETECTED.  The SARS-CoV-2 RNA is generally detectable in upper and lower respiratory specimens during the acute phase of infection. The lowest concentration of SARS-CoV-2 viral copies this assay can detect is 250 copies / mL. A negative result does not preclude SARS-CoV-2 infection and should not be used as the sole basis for treatment or other patient management decisions.  A negative result may occur with improper specimen collection / handling, submission of specimen other than nasopharyngeal swab, presence of viral mutation(s) within the areas targeted by this assay, and inadequate number of viral copies (<250 copies / mL). A negative result must be combined with clinical observations, patient history, and epidemiological information.  Fact Sheet for Patients:   StrictlyIdeas.no  Fact Sheet for Healthcare Providers: BankingDealers.co.za  This test is not yet approved or  cleared by the [place] FDA and has been authorized for detection and/or diagnosis of SARS-CoV-2 by FDA under an  Emergency Use Authorization (EUA).  This EUA will remain in effect (meaning this test can be used) for the duration of the COVID-19 declaration under Section 564(b)(1) of the Act, 21 U.S.C. section 360bbb-3(b)(1), unless the authorization is terminated or revoked sooner.  Performed  at [facility], [place] [address], [place], [place] [address]   Culture, blood (routine x 2)     Status: None (Preliminary result)   Collection Time: 09/20/19 10:44 AM   Specimen: BLOOD RIGHT WRIST  Result Value Ref Range Status   Specimen Description BLOOD RIGHT WRIST  Final   Special Requests   Final    BOTTLES DRAWN AEROBIC AND ANAEROBIC Blood Culture results may not be optimal due to an inadequate volume of blood received in culture bottles   Culture   Final    NO GROWTH 3 DAYS Performed at [facility], [place] [address] [place] [address], [place], [place] [address]    Report Status PENDING  Incomplete     Time coordinating discharge:35 minutes.   SIGNED:   Hosie Poisson, MD  [facility] 09/23/2019, 5:45 PM

## 2019-09-25 LAB — CULTURE, BLOOD (ROUTINE X 2)
Culture: NO GROWTH
Culture: NO GROWTH

## 2019-10-07 ENCOUNTER — Telehealth: Payer: Self-pay | Admitting: Primary Care

## 2019-10-07 NOTE — Telephone Encounter (Signed)
Trilogy compliance report - 88 days used - Average 8 hours - EPAP max 10; EPAP min 5 - Peak inspiratory 15.5cm h20 - Average end expiratory 6.9cm h20 - Mean airway pressure 10cm h20

## 2019-10-09 ENCOUNTER — Emergency Department (HOSPITAL_COMMUNITY)
Admission: EM | Admit: 2019-10-09 | Discharge: 2019-10-09 | Disposition: A | Discharge status: Home / Self Care | Payer: Medicare Other | Source: Home / Self Care

## 2019-10-09 ENCOUNTER — Other Ambulatory Visit: Payer: Self-pay

## 2019-10-09 ENCOUNTER — Emergency Department (HOSPITAL_COMMUNITY): Payer: Medicare Other

## 2019-10-09 DIAGNOSIS — Z5321 Procedure and treatment not carried out due to patient leaving prior to being seen by health care provider: Secondary | ICD-10-CM | POA: Insufficient documentation

## 2019-10-09 DIAGNOSIS — R05 Cough: Secondary | ICD-10-CM | POA: Insufficient documentation

## 2019-10-09 DIAGNOSIS — R0602 Shortness of breath: Secondary | ICD-10-CM | POA: Insufficient documentation

## 2019-10-09 DIAGNOSIS — J441 Chronic obstructive pulmonary disease with (acute) exacerbation: Secondary | ICD-10-CM | POA: Diagnosis not present

## 2019-10-09 DIAGNOSIS — J449 Chronic obstructive pulmonary disease, unspecified: Secondary | ICD-10-CM | POA: Insufficient documentation

## 2019-10-09 LAB — CBC WITH DIFFERENTIAL/PLATELET
Abs Immature Granulocytes: 0.03 10*3/uL (ref 0.00–0.07)
Basophils Absolute: 0 10*3/uL (ref 0.0–0.1)
Basophils Relative: 0 %
Eosinophils Absolute: 0.1 10*3/uL (ref 0.0–0.5)
Eosinophils Relative: 1 %
HCT: 36.5 % (ref 36.0–46.0)
Hemoglobin: 10.4 g/dL — ABNORMAL LOW (ref 12.0–15.0)
Immature Granulocytes: 0 %
Lymphocytes Relative: 19 %
Lymphs Abs: 1.3 10*3/uL (ref 0.7–4.0)
MCH: 27.7 pg (ref 26.0–34.0)
MCHC: 28.5 g/dL — ABNORMAL LOW (ref 30.0–36.0)
MCV: 97.1 fL (ref 80.0–100.0)
Monocytes Absolute: 0.6 10*3/uL (ref 0.1–1.0)
Monocytes Relative: 9 %
Neutro Abs: 5.1 10*3/uL (ref 1.7–7.7)
Neutrophils Relative %: 71 %
Platelets: 161 10*3/uL (ref 150–400)
RBC: 3.76 MIL/uL — ABNORMAL LOW (ref 3.87–5.11)
RDW: 15.4 % (ref 11.5–15.5)
WBC: 7.1 10*3/uL (ref 4.0–10.5)
nRBC: 0 % (ref 0.0–0.2)

## 2019-10-09 LAB — COMPREHENSIVE METABOLIC PANEL
ALT: 20 U/L (ref 0–44)
AST: 16 U/L (ref 15–41)
Albumin: 3.2 g/dL — ABNORMAL LOW (ref 3.5–5.0)
Alkaline Phosphatase: 92 U/L (ref 38–126)
Anion gap: 9 (ref 5–15)
BUN: 15 mg/dL (ref 8–23)
CO2: 36 mmol/L — ABNORMAL HIGH (ref 22–32)
Calcium: 9.1 mg/dL (ref 8.9–10.3)
Chloride: 94 mmol/L — ABNORMAL LOW (ref 98–111)
Creatinine, Ser: 0.91 mg/dL (ref 0.44–1.00)
GFR calc Af Amer: >60 mL/min (ref >60)
GFR calc non Af Amer: >60 mL/min (ref >60)
Glucose, Bld: 238 mg/dL — ABNORMAL HIGH (ref 70–99)
Potassium: 4.6 mmol/L (ref 3.5–5.1)
Sodium: 139 mmol/L (ref 135–145)
Total Bilirubin: 0.5 mg/dL (ref 0.3–1.2)
Total Protein: 6.6 g/dL (ref 6.5–8.1)

## 2019-10-09 NOTE — ED Notes (Signed)
Pt called for vital reassessment x3 with no response 

## 2019-10-09 NOTE — ED Triage Notes (Signed)
Patient arrived with EMS from home reports worsening SOB with productive cough this week , history of COPD uses 4 lpm/[place] , denies fever or chills .

## 2019-10-10 ENCOUNTER — Encounter (HOSPITAL_COMMUNITY): Payer: Self-pay

## 2019-10-10 ENCOUNTER — Emergency Department (HOSPITAL_COMMUNITY): Payer: Medicare Other

## 2019-10-10 ENCOUNTER — Inpatient Hospital Stay (HOSPITAL_COMMUNITY)
Admission: EM | Admit: 2019-10-10 | Discharge: 2019-10-15 | DRG: 190 | Disposition: A | Discharge status: Hospice | Payer: Medicare Other | Attending: Family Medicine | Admitting: Family Medicine

## 2019-10-10 DIAGNOSIS — R6510 Systemic inflammatory response syndrome (SIRS) of non-infectious origin without acute organ dysfunction: Secondary | ICD-10-CM | POA: Diagnosis present

## 2019-10-10 DIAGNOSIS — Z955 Presence of coronary angioplasty implant and graft: Secondary | ICD-10-CM | POA: Diagnosis not present

## 2019-10-10 DIAGNOSIS — J9601 Acute respiratory failure with hypoxia: Secondary | ICD-10-CM | POA: Diagnosis present

## 2019-10-10 DIAGNOSIS — E785 Hyperlipidemia, unspecified: Secondary | ICD-10-CM | POA: Diagnosis present

## 2019-10-10 DIAGNOSIS — Z8249 Family history of ischemic heart disease and other diseases of the circulatory system: Secondary | ICD-10-CM | POA: Diagnosis not present

## 2019-10-10 DIAGNOSIS — Z7984 Long term (current) use of oral hypoglycemic drugs: Secondary | ICD-10-CM

## 2019-10-10 DIAGNOSIS — J449 Chronic obstructive pulmonary disease, unspecified: Secondary | ICD-10-CM | POA: Diagnosis present

## 2019-10-10 DIAGNOSIS — Z87891 Personal history of nicotine dependence: Secondary | ICD-10-CM

## 2019-10-10 DIAGNOSIS — M5030 Other cervical disc degeneration, unspecified cervical region: Secondary | ICD-10-CM | POA: Diagnosis present

## 2019-10-10 DIAGNOSIS — K589 Irritable bowel syndrome without diarrhea: Secondary | ICD-10-CM | POA: Diagnosis present

## 2019-10-10 DIAGNOSIS — J9621 Acute and chronic respiratory failure with hypoxia: Secondary | ICD-10-CM | POA: Diagnosis present

## 2019-10-10 DIAGNOSIS — Z66 Do not resuscitate: Secondary | ICD-10-CM | POA: Diagnosis present

## 2019-10-10 DIAGNOSIS — Z9851 Tubal ligation status: Secondary | ICD-10-CM

## 2019-10-10 DIAGNOSIS — E1151 Type 2 diabetes mellitus with diabetic peripheral angiopathy without gangrene: Secondary | ICD-10-CM | POA: Diagnosis present

## 2019-10-10 DIAGNOSIS — I2510 Atherosclerotic heart disease of native coronary artery without angina pectoris: Secondary | ICD-10-CM | POA: Diagnosis present

## 2019-10-10 DIAGNOSIS — Z8673 Personal history of transient ischemic attack (TIA), and cerebral infarction without residual deficits: Secondary | ICD-10-CM

## 2019-10-10 DIAGNOSIS — Z9049 Acquired absence of other specified parts of digestive tract: Secondary | ICD-10-CM

## 2019-10-10 DIAGNOSIS — K219 Gastro-esophageal reflux disease without esophagitis: Secondary | ICD-10-CM | POA: Diagnosis present

## 2019-10-10 DIAGNOSIS — J441 Chronic obstructive pulmonary disease with (acute) exacerbation: Principal | ICD-10-CM | POA: Diagnosis present

## 2019-10-10 DIAGNOSIS — Z7901 Long term (current) use of anticoagulants: Secondary | ICD-10-CM

## 2019-10-10 DIAGNOSIS — J9622 Acute and chronic respiratory failure with hypercapnia: Secondary | ICD-10-CM | POA: Diagnosis present

## 2019-10-10 DIAGNOSIS — I110 Hypertensive heart disease with heart failure: Secondary | ICD-10-CM | POA: Diagnosis present

## 2019-10-10 DIAGNOSIS — I5032 Chronic diastolic (congestive) heart failure: Secondary | ICD-10-CM | POA: Diagnosis present

## 2019-10-10 DIAGNOSIS — I480 Paroxysmal atrial fibrillation: Secondary | ICD-10-CM | POA: Diagnosis present

## 2019-10-10 DIAGNOSIS — I10 Essential (primary) hypertension: Secondary | ICD-10-CM | POA: Diagnosis present

## 2019-10-10 DIAGNOSIS — Z9981 Dependence on supplemental oxygen: Secondary | ICD-10-CM

## 2019-10-10 DIAGNOSIS — F419 Anxiety disorder, unspecified: Secondary | ICD-10-CM | POA: Diagnosis present

## 2019-10-10 DIAGNOSIS — Z20822 Contact with and (suspected) exposure to covid-19: Secondary | ICD-10-CM | POA: Diagnosis present

## 2019-10-10 DIAGNOSIS — Z79899 Other long term (current) drug therapy: Secondary | ICD-10-CM

## 2019-10-10 DIAGNOSIS — G4700 Insomnia, unspecified: Secondary | ICD-10-CM | POA: Diagnosis present

## 2019-10-10 DIAGNOSIS — Z8711 Personal history of peptic ulcer disease: Secondary | ICD-10-CM

## 2019-10-10 DIAGNOSIS — Z7902 Long term (current) use of antithrombotics/antiplatelets: Secondary | ICD-10-CM

## 2019-10-10 DIAGNOSIS — Z825 Family history of asthma and other chronic lower respiratory diseases: Secondary | ICD-10-CM

## 2019-10-10 DIAGNOSIS — F329 Major depressive disorder, single episode, unspecified: Secondary | ICD-10-CM | POA: Diagnosis present

## 2019-10-10 LAB — CBC WITH DIFFERENTIAL/PLATELET
Abs Immature Granulocytes: 0.03 10*3/uL (ref 0.00–0.07)
Basophils Absolute: 0 10*3/uL (ref 0.0–0.1)
Basophils Relative: 0 %
Eosinophils Absolute: 0 10*3/uL (ref 0.0–0.5)
Eosinophils Relative: 0 %
HCT: 33.9 % — ABNORMAL LOW (ref 36.0–46.0)
Hemoglobin: 9.8 g/dL — ABNORMAL LOW (ref 12.0–15.0)
Immature Granulocytes: 0 %
Lymphocytes Relative: 14 %
Lymphs Abs: 1 10*3/uL (ref 0.7–4.0)
MCH: 28 pg (ref 26.0–34.0)
MCHC: 28.9 g/dL — ABNORMAL LOW (ref 30.0–36.0)
MCV: 96.9 fL (ref 80.0–100.0)
Monocytes Absolute: 0.3 10*3/uL (ref 0.1–1.0)
Monocytes Relative: 5 %
Neutro Abs: 5.8 10*3/uL (ref 1.7–7.7)
Neutrophils Relative %: 81 %
Platelets: 125 10*3/uL — ABNORMAL LOW (ref 150–400)
RBC: 3.5 MIL/uL — ABNORMAL LOW (ref 3.87–5.11)
RDW: 15.4 % (ref 11.5–15.5)
WBC: 7.2 10*3/uL (ref 4.0–10.5)
nRBC: 0 % (ref 0.0–0.2)

## 2019-10-10 LAB — BLOOD GAS, ARTERIAL
Acid-Base Excess: 8.6 mmol/L — ABNORMAL HIGH (ref 0.0–2.0)
Bicarbonate: 37.7 mmol/L — ABNORMAL HIGH (ref 20.0–28.0)
Drawn by: 257701
O2 Content: 4 L/min
O2 Saturation: 93.6 %
Patient temperature: 100.4
pCO2 arterial: 84.5 mmHg (ref 32.0–48.0)
pH, Arterial: 7.272 — ABNORMAL LOW (ref 7.350–7.450)
pO2, Arterial: 74.8 mmHg — ABNORMAL LOW (ref 83.0–108.0)

## 2019-10-10 LAB — RESPIRATORY PANEL BY PCR

## 2019-10-10 LAB — COMPREHENSIVE METABOLIC PANEL
ALT: 20 U/L (ref 0–44)
AST: 17 U/L (ref 15–41)
Albumin: 3.3 g/dL — ABNORMAL LOW (ref 3.5–5.0)
Alkaline Phosphatase: 75 U/L (ref 38–126)
Anion gap: 8 (ref 5–15)
BUN: 17 mg/dL (ref 8–23)
CO2: 39 mmol/L — ABNORMAL HIGH (ref 22–32)
Calcium: 8.7 mg/dL — ABNORMAL LOW (ref 8.9–10.3)
Chloride: 90 mmol/L — ABNORMAL LOW (ref 98–111)
Creatinine, Ser: 0.7 mg/dL (ref 0.44–1.00)
GFR calc Af Amer: >60 mL/min (ref >60)
GFR calc non Af Amer: >60 mL/min (ref >60)
Glucose, Bld: 283 mg/dL — ABNORMAL HIGH (ref 70–99)
Potassium: 4.1 mmol/L (ref 3.5–5.1)
Sodium: 137 mmol/L (ref 135–145)
Total Bilirubin: 0.4 mg/dL (ref 0.3–1.2)
Total Protein: 6.4 g/dL — ABNORMAL LOW (ref 6.5–8.1)

## 2019-10-10 LAB — SARS CORONAVIRUS 2 BY RT PCR (HOSPITAL ORDER, PERFORMED IN ~~LOC~~ HOSPITAL LAB): SARS Coronavirus 2: NEGATIVE

## 2019-10-10 MED ORDER — BUDESONIDE 0.5 MG/2ML IN SUSP: 2.0000 mg | Freq: Four times a day (QID) | RESPIRATORY_TRACT | Status: DC

## 2019-10-10 MED ORDER — SODIUM CHLORIDE 0.9% FLUSH: 3.0000 mL | INTRAVENOUS | Status: DC | PRN

## 2019-10-10 MED ORDER — METHOCARBAMOL 500 MG PO TABS: 750.0000 mg | ORAL_TABLET | Freq: Three times a day (TID) | ORAL | Status: DC | PRN

## 2019-10-10 MED ORDER — PANTOPRAZOLE SODIUM 40 MG PO TBEC
40.0000 mg | DELAYED_RELEASE_TABLET | Freq: Every day | ORAL | Status: DC
  Administered 2019-10-10 – 2019-10-15 (×6): 40 mg via ORAL
  Filled 2019-10-10 (×6): qty 1

## 2019-10-10 MED ORDER — CLOPIDOGREL BISULFATE 75 MG PO TABS
75.0000 mg | ORAL_TABLET | Freq: Every day | ORAL | Status: DC
  Administered 2019-10-11 – 2019-10-15 (×5): 75 mg via ORAL
  Filled 2019-10-10 (×5): qty 1

## 2019-10-10 MED ORDER — CALCIUM CARBONATE 1250 (500 CA) MG PO TABS
1250.0000 mg | ORAL_TABLET | Freq: Every day | ORAL | Status: DC
  Administered 2019-10-11 – 2019-10-15 (×5): 1250 mg via ORAL
  Filled 2019-10-10 (×5): qty 1

## 2019-10-10 MED ORDER — NITROGLYCERIN 0.4 MG SL SUBL: 0.4000 mg | SUBLINGUAL_TABLET | SUBLINGUAL | Status: DC | PRN

## 2019-10-10 MED ORDER — TIOTROPIUM BROMIDE MONOHYDRATE 2.5 MCG/ACT IN AERS: 2.0000 | INHALATION_SPRAY | Freq: Every day | RESPIRATORY_TRACT | Status: DC

## 2019-10-10 MED ORDER — FUROSEMIDE 40 MG PO TABS
40.0000 mg | ORAL_TABLET | ORAL | Status: DC
  Administered 2019-10-11 – 2019-10-15 (×3): 40 mg via ORAL
  Filled 2019-10-10 (×3): qty 1

## 2019-10-10 MED ORDER — BUDESONIDE 0.5 MG/2ML IN SUSP
0.5000 mg | Freq: Two times a day (BID) | RESPIRATORY_TRACT | Status: DC
  Administered 2019-10-10 – 2019-10-15 (×10): 0.5 mg via RESPIRATORY_TRACT
  Filled 2019-10-10 (×10): qty 2

## 2019-10-10 MED ORDER — DILTIAZEM HCL ER COATED BEADS 120 MG PO CP24
120.0000 mg | ORAL_CAPSULE | Freq: Every day | ORAL | Status: DC
  Administered 2019-10-11 – 2019-10-15 (×5): 120 mg via ORAL
  Filled 2019-10-10 (×5): qty 1

## 2019-10-10 MED ORDER — FLUTICASONE FUROATE-VILANTEROL 100-25 MCG/INH IN AEPB
1.0000 | INHALATION_SPRAY | Freq: Every day | RESPIRATORY_TRACT | Status: DC
  Administered 2019-10-11 – 2019-10-15 (×5): 1 via RESPIRATORY_TRACT
  Filled 2019-10-10: qty 28

## 2019-10-10 MED ORDER — LETROZOLE 2.5 MG PO TABS
2.5000 mg | ORAL_TABLET | Freq: Every day | ORAL | Status: DC
  Administered 2019-10-11 – 2019-10-15 (×5): 2.5 mg via ORAL
  Filled 2019-10-10 (×5): qty 1

## 2019-10-10 MED ORDER — ALBUTEROL SULFATE HFA 108 (90 BASE) MCG/ACT IN AERS: 2.0000 | INHALATION_SPRAY | Freq: Four times a day (QID) | RESPIRATORY_TRACT | Status: DC | PRN

## 2019-10-10 MED ORDER — ATORVASTATIN CALCIUM 40 MG PO TABS
80.0000 mg | ORAL_TABLET | Freq: Every day | ORAL | Status: DC
  Administered 2019-10-11 – 2019-10-15 (×5): 80 mg via ORAL
  Filled 2019-10-10 (×5): qty 2

## 2019-10-10 MED ORDER — PREDNISONE 20 MG PO TABS
40.0000 mg | ORAL_TABLET | Freq: Every day | ORAL | Status: DC
  Administered 2019-10-12: 40 mg via ORAL
  Filled 2019-10-10: qty 2

## 2019-10-10 MED ORDER — RISAQUAD PO CAPS
1.0000 | ORAL_CAPSULE | Freq: Two times a day (BID) | ORAL | Status: DC
  Administered 2019-10-10 – 2019-10-15 (×10): 1 via ORAL
  Filled 2019-10-10 (×10): qty 1

## 2019-10-10 MED ORDER — APIXABAN 5 MG PO TABS
5.0000 mg | ORAL_TABLET | Freq: Two times a day (BID) | ORAL | Status: DC
  Administered 2019-10-10 – 2019-10-15 (×10): 5 mg via ORAL
  Filled 2019-10-10 (×10): qty 1

## 2019-10-10 MED ORDER — SODIUM CHLORIDE 0.9 % IV SOLN
2.0000 g | Freq: Three times a day (TID) | INTRAVENOUS | Status: AC
  Administered 2019-10-10 – 2019-10-15 (×15): 2 g via INTRAVENOUS
  Filled 2019-10-10 (×15): qty 2

## 2019-10-10 MED ORDER — GABAPENTIN 300 MG PO CAPS
300.0000 mg | ORAL_CAPSULE | Freq: Three times a day (TID) | ORAL | Status: DC
  Administered 2019-10-10 – 2019-10-15 (×15): 300 mg via ORAL
  Filled 2019-10-10 (×15): qty 1

## 2019-10-10 MED ORDER — SODIUM CHLORIDE 0.9 % IV SOLN: 250.0000 mL | INTRAVENOUS | Status: DC | PRN

## 2019-10-10 MED ORDER — SODIUM CHLORIDE 0.9 % IV SOLN
INTRAVENOUS | Status: DC | PRN
  Administered 2019-10-10: 250 mL via INTRAVENOUS

## 2019-10-10 MED ORDER — VITAMIN D3 25 MCG (1000 UNIT) PO TABS
2000.0000 [IU] | ORAL_TABLET | Freq: Every day | ORAL | Status: DC
  Administered 2019-10-11 – 2019-10-15 (×5): 2000 [IU] via ORAL
  Filled 2019-10-10 (×5): qty 2

## 2019-10-10 MED ORDER — METFORMIN HCL 500 MG PO TABS
1000.0000 mg | ORAL_TABLET | Freq: Two times a day (BID) | ORAL | Status: DC
  Administered 2019-10-11 – 2019-10-15 (×10): 1000 mg via ORAL
  Filled 2019-10-10 (×10): qty 2

## 2019-10-10 MED ORDER — ALPRAZOLAM 0.25 MG PO TABS
0.2500 mg | ORAL_TABLET | Freq: Two times a day (BID) | ORAL | Status: DC | PRN
  Administered 2019-10-10: 0.25 mg via ORAL
  Filled 2019-10-10: qty 1

## 2019-10-10 MED ORDER — METHYLPREDNISOLONE SODIUM SUCC 40 MG IJ SOLR
40.0000 mg | Freq: Four times a day (QID) | INTRAMUSCULAR | Status: AC
  Administered 2019-10-10 – 2019-10-11 (×4): 40 mg via INTRAVENOUS
  Filled 2019-10-10 (×4): qty 1

## 2019-10-10 MED ORDER — DONEPEZIL HCL 5 MG PO TABS
5.0000 mg | ORAL_TABLET | Freq: Every day | ORAL | Status: DC
  Administered 2019-10-10 – 2019-10-15 (×6): 5 mg via ORAL
  Filled 2019-10-10 (×6): qty 1

## 2019-10-10 MED ORDER — SODIUM CHLORIDE 0.9% FLUSH
3.0000 mL | Freq: Two times a day (BID) | INTRAVENOUS | Status: DC
  Administered 2019-10-11 – 2019-10-15 (×7): 3 mL via INTRAVENOUS

## 2019-10-10 MED ORDER — METOPROLOL SUCCINATE ER 50 MG PO TB24
25.0000 mg | ORAL_TABLET | Freq: Every day | ORAL | Status: DC
  Administered 2019-10-10 – 2019-10-15 (×6): 25 mg via ORAL
  Filled 2019-10-10 (×6): qty 1

## 2019-10-10 MED ORDER — DULOXETINE HCL 30 MG PO CPEP
60.0000 mg | ORAL_CAPSULE | Freq: Every day | ORAL | Status: DC
  Administered 2019-10-11 – 2019-10-15 (×5): 60 mg via ORAL
  Filled 2019-10-10 (×5): qty 2

## 2019-10-10 MED ORDER — UMECLIDINIUM-VILANTEROL 62.5-25 MCG/INH IN AEPB
1.0000 | INHALATION_SPRAY | Freq: Every day | RESPIRATORY_TRACT | Status: DC
  Filled 2019-10-10: qty 14

## 2019-10-10 MED ORDER — ACETAMINOPHEN 325 MG PO TABS: 650.0000 mg | ORAL_TABLET | ORAL | Status: DC | PRN

## 2019-10-10 MED ORDER — POTASSIUM CHLORIDE CRYS ER 20 MEQ PO TBCR
20.0000 meq | EXTENDED_RELEASE_TABLET | Freq: Every day | ORAL | Status: DC
  Administered 2019-10-10 – 2019-10-15 (×6): 20 meq via ORAL
  Filled 2019-10-10 (×6): qty 1

## 2019-10-10 MED ORDER — MAGNESIUM SULFATE 2 GM/50ML IV SOLN
2.0000 g | Freq: Once | INTRAVENOUS | Status: AC
  Administered 2019-10-10: 2 g via INTRAVENOUS
  Filled 2019-10-10: qty 50

## 2019-10-10 MED ORDER — ADULT MULTIVITAMIN W/MINERALS CH
1.0000 | ORAL_TABLET | Freq: Every day | ORAL | Status: DC
  Administered 2019-10-11 – 2019-10-15 (×5): 1 via ORAL
  Filled 2019-10-10 (×5): qty 1

## 2019-10-10 NOTE — ED Notes (Signed)
I attempt to call report. The nurse is in a room and cannot take report now. I will attempt again in a few minutes.

## 2019-10-10 NOTE — ED Notes (Signed)
She remains lucid and in on distress. She laments that she is unable to drink coffee (she is n.p.o.). She has no other requests/complaints.

## 2019-10-10 NOTE — ED Notes (Signed)
Dee from respiratory has been notified, and will be placing pt. On bi-pap shortly. Pt's. Daughter has just returned, and is quite happy with this plan. Pt. Remains drowsy (but not excessively so) and in no distress.

## 2019-10-10 NOTE — Progress Notes (Signed)
Notified Lab that ABG being sent for analysis. 

## 2019-10-10 NOTE — Progress Notes (Signed)
Assisted transferring PT to 4W while on BiPAP- 4W RT aware PT being transferred.

## 2019-10-10 NOTE — ED Notes (Signed)
Date and time results received: 10/10/19 1:37 PM  (use smartphrase ".now" to insert current time)  Test: PaCo2 Critical Value: 84.5  Name of Provider Notified: Dr. Estell Harpin  Orders Received? Or Actions Taken?:

## 2019-10-10 NOTE — H&P (Signed)
[facility] NAME: Patricia Wall    MR#:  [id number]  DATE OF BIRTH:  [birth year]  DATE OF ADMISSION:  10/10/2019  PRIMARY CARE PHYSICIAN: Bernerd Limbo, MD   REQUESTING/REFERRING PHYSICIAN: Milton Ferguson, MD  Comes from home -followed by [place]:   Chief Complaint  Patient presents with  . Shortness of Breath    HISTORY OF PRESENT ILLNESS:  Patricia Wall  is a 69 y.o. female with a known history of hypertension, diastolic heart failure, coronary artery disease, breast cancer status post lumpectomy and radiation treatment, type 2 diabetes, chronic respiratory failure with hypoxia requiring 4 L oxygen via nasal cannula with COPD is being admitted for acute on chronic hypoxic respiratory failure due to COPD exacerbation.  Patient was recently admitted from July 2 till July 5 of 2021.  She has had 4 readmissions in last 23-month  She was brought in via EMS from home for worsening shortness of breath with productive cough.  She denies any fever.  Her daughter provides most history who is at bedside.  Her daughter reports that patient has had this issue for a long time and typically requires 2 to 3 days of hospitalization, BiPAP and she normally responds well to that.  She was discharged with trilogy on last admission and has been monitored by her pulmonologist as an outpatient.  Her daughter request I put her on BiPAP immediately and give her some anxiety medicine. PAST MEDICAL HISTORY:   Past Medical History:  Diagnosis Date  . Abnormal chest xray   . ABUSE, ALCOHOL, UNSPECIFIED 09/04/2006   Qualifier: Diagnosis of  By: MAmil AmenMD, EBenjamine Mola   . Acute diastolic CHF (congestive heart failure) ([place]   . Acute on chronic respiratory failure with hypercapnia ([facility]   . Acute respiratory failure with hypoxia ([facility] 01/07/2015  . Alcohol abuse   . Angina, class III ([place] 03/29/2007   Qualifier: Diagnosis of  By: MAmil AmenMD,  EBenjamine Mola   . ARF (acute renal failure) ([facility] 04/27/2019  . Back pain   . CAD (coronary artery disease)     [facility]  Stent to OM and LAD Last cath 6/11 cathed on June 13, [name]. 2011.  The cardiac catheterization showed 30% and 50% lesions in the mid  and distal LAD, 50% in the OM1, 40% in the OM2 with 20% in-stent  restenosis, 40% in-stent restenosis in the circumflex and no significant  disease in the RCA.  Her EF was normal.  Dr. BOlevia Perchesrecommended a trial  of proton pump inhibitors   . CAD S/P percutaneous coronary angioplasty: Prior Cypher DES to pLAD, pOM2 & AVG Cx (after Om2); New DES to AVG Cx ISR & Angiosculpt PTCA of Ostial AVG Cx from OM2. 09/04/2006   Qualifier: Diagnosis of  By: MAmil AmenMD, EBenjamine Mola   . Chest pain   . COPD (chronic obstructive pulmonary disease) ([place]   . COPD GOLD II / still smoking  09/04/2006   Followed in Pulmonary clinic/ [facility]/ [place]  Active smoker  - pft's 12/12/12 FEV1  1.41 (53%) ratio 54 with no significant reversibility and DLCO 47% corrects to 50 01/16/2013  Walked RA x 2 laps @ 185 ft each stopped due to sob, no desat     - Trial of anoro 01/16/2013 > helped but insurance would not pay - incruse one daily started 10/02/2013 >>>not covered  -Spiriva restarted 10/15/13 >  . Cyanosis   .  DDD (degenerative disc disease)   . DEGENERATIVE DISC DISEASE, CERVICAL SPINE 09/04/2006   Annotation: c6-7 on the right Qualifier: Diagnosis of  By: Amil Amen MD, Benjamine Mola    . Depression   . Diarrhea   . Diverticular disease   . DIVERTICULOSIS, COLON 09/04/2006   Qualifier: Diagnosis of  By: Amil Amen MD, Benjamine Mola    . GERD (gastroesophageal reflux disease)   . H/O: hysterectomy   . Hemorrhoids   . HTN (hypertension)   . IBS (irritable bowel syndrome)   . Insomnia   . Irritable bowel syndrome 09/04/2006   Qualifier: Diagnosis of  By: Amil Amen MD, Benjamine Mola    . Lymphadenitis   . PEPTIC ULCER DISEASE 09/04/2006   Annotation: distant history of   Qualifier: Diagnosis of  By: Amil Amen MD, Benjamine Mola    . Pharyngitis   . PHARYNGITIS 07/18/2007   Qualifier: Diagnosis of  By: Radene Ou MD, [place]    . PUD (peptic ulcer disease)   . PVD (peripheral vascular disease) ([place])   . Sinusitis   . TIA (transient ischemic attack)   . Tobacco abuse   . Vertigo    PAST SURGICAL HISTORY:   Past Surgical History:  Procedure Laterality Date  . ABDOMINAL HYSTERECTOMY    . APPENDECTOMY    . BREAST LUMPECTOMY Left 2015  . CARDIAC CATHETERIZATION  2005, 2006, 2009,2011  . CHOLECYSTECTOMY    . CORONARY STENT INTERVENTION N/A 04/22/2019   Procedure: CORONARY STENT INTERVENTION;  Surgeon: Troy Sine, MD;  Location: [facility];  Service: Cardiovascular;  Laterality: N/A;  . CORONARY STENT PLACEMENT     Status post balloon angiogram plasty and Cypher stent to the distal    circumflex and OM2 branch  . ESOPHAGOGASTRODUODENOSCOPY (EGD) WITH PROPOFOL Left 04/21/2019   Procedure: ESOPHAGOGASTRODUODENOSCOPY (EGD) WITH PROPOFOL;  Surgeon: Arta Silence, MD;  Location: [facility];  Service: Endoscopy;  Laterality: Left;  . Hammertoe repair    . Left Bunionectomy    . LEFT HEART CATH AND CORONARY ANGIOGRAPHY N/A 04/22/2019   Procedure: LEFT HEART CATH AND CORONARY ANGIOGRAPHY;  Surgeon: Troy Sine, MD;  Location: [facility];  Service: Cardiovascular;  Laterality: N/A;  . LEFT HEART CATHETERIZATION WITH CORONARY ANGIOGRAM N/A 12/03/2013   Procedure: LEFT HEART CATHETERIZATION WITH CORONARY ANGIOGRAM;  Surgeon: Leonie Man, MD;  Location: [facility] CATH LAB;  Service: Cardiovascular;  Laterality: N/A;  . TUBAL LIGATION     SOCIAL HISTORY:   Social History   Tobacco Use  . Smoking status: Former Smoker    Packs/day: 0.25    Years: 59.00    Pack years: 14.75    Types: Cigarettes  . Smokeless tobacco: Never Used  Substance Use Topics  . Alcohol use: No    Alcohol/week: 0.0 standard drinks   FAMILY HISTORY:   Family History    Problem Relation Age of Onset  . Heart disease Mother   . Rheumatologic disease Mother   . Cancer - Other Mother        Uterine  . Emphysema Father   . Heart disease Father   . Cancer - Other Father   . Coronary artery disease Other   . Asthma Sister        2 sisters  . Stroke Sister    DRUG ALLERGIES:  No Known Allergies REVIEW OF SYSTEMS:  Review of Systems  Constitutional: Negative for diaphoresis, fever, malaise/fatigue and weight loss.  HENT: Negative for ear discharge, ear pain, hearing loss, nosebleeds, sore throat  and tinnitus.   Eyes: Negative for blurred vision and pain.  Respiratory: Positive for cough, shortness of breath and wheezing. Negative for hemoptysis.   Cardiovascular: Negative for chest pain, palpitations, orthopnea and leg swelling.  Gastrointestinal: Negative for abdominal pain, blood in stool, constipation, diarrhea, heartburn, nausea and vomiting.  Genitourinary: Negative for dysuria, frequency and urgency.  Musculoskeletal: Negative for back pain and myalgias.  Skin: Negative for itching and rash.  Neurological: Negative for dizziness, tingling, tremors, focal weakness, seizures, weakness and headaches.  Psychiatric/Behavioral: Negative for depression. The patient is not nervous/anxious.    MEDICATIONS AT HOME:   Prior to Admission medications   Medication Sig Start Date End Date Taking? Authorizing Provider  acetaminophen (TYLENOL) 325 MG tablet Take 2 tablets (650 mg total) by mouth every 4 (four) hours as needed for headache or mild pain. 04/23/19   Raiford Noble Latif, DO  acidophilus (RISAQUAD) CAPS capsule Take 1 capsule by mouth 2 (two) times daily. 04/09/19   [provider]  albuterol (PROVENTIL) (2.5 MG/3ML) 0.083% nebulizer solution USE 1 VIAL IN NEBULIZER EVERY 4 HOURS FOR WHEEZING OR SHORTNESS OF BREATH. Patient taking differently: Take 2.5 mg by nebulization every 4 (four) hours as needed for wheezing or shortness of breath.  04/29/19    Tanda Rockers, MD  albuterol (VENTOLIN HFA) 108 (90 Base) MCG/ACT inhaler Inhale 2 puffs into the lungs every 6 (six) hours as needed for wheezing or shortness of breath. 07/22/19   Martyn Ehrich, NP  ALPRAZolam Duanne Moron) 0.25 MG tablet Take 1 tablet (0.25 mg total) by mouth 2 (two) times daily as needed for anxiety. 09/23/19   Hosie Poisson, MD  apixaban (ELIQUIS) 5 MG TABS tablet Take 1 tablet (5 mg total) by mouth 2 (two) times daily. 05/30/19   Josue Hector, MD  atorvastatin (LIPITOR) 80 MG tablet Take 80 mg by mouth daily.    [provider]  blood glucose meter kit and supplies Dispense based on patient and insurance preference. Use up to four times daily as directed. (FOR ICD-9 250.00, 250.01). Patient taking differently: 1 each by Other route See admin instructions. Dispense based on patient and insurance preference. Use up to four times daily as directed. (FOR ICD-9 250.00, 250.01). 01/13/15   Reyne Dumas, MD  calcium carbonate (OS-CAL) 600 MG TABS Take 600 mg by mouth every morning.     [provider]  Cholecalciferol (VITAMIN D) 2000 UNITS tablet Take 2,000 Units by mouth every morning.     [provider]  clopidogrel (PLAVIX) 75 MG tablet Take 1 tablet (75 mg total) by mouth daily with breakfast. 05/30/19   Burtis Junes, NP  diltiazem (CARDIZEM CD) 120 MG 24 hr capsule Take 120 mg by mouth daily.  04/05/19   [provider]  donepezil (ARICEPT) 5 MG tablet Take 5 mg by mouth daily.  01/10/18   [provider]  DULoxetine (CYMBALTA) 60 MG capsule Take 60 mg by mouth every morning.     [provider]  furosemide (LASIX) 40 MG tablet Take 40 mg by mouth every other day. 07/18/19   [provider]  gabapentin (NEURONTIN) 300 MG capsule Take 300 mg by mouth 3 (three) times daily.     [provider]  ipratropium-albuterol (DUONEB) 0.5-2.5 (3) MG/3ML SOLN Take 3 mLs by nebulization in the morning, at noon, in the  evening, and at bedtime. 09/10/19   Tanda Rockers, MD  Lancets (FREESTYLE) lancets Use as instructed Patient taking  differently: 1 each by Other route as directed.  01/13/15   Reyne Dumas, MD  letrozole [facility]) 2.5 MG tablet Take 2.5 mg by mouth every morning.     [provider]  lidocaine (LIDODERM) 5 % Place 1 patch onto the skin as needed (pain).  08/16/19 11/14/19  [provider]  metFORMIN (GLUCOPHAGE) 1000 MG tablet Take 1,000 mg by mouth 2 (two) times daily. 07/09/19   [provider]  methocarbamol (ROBAXIN) 750 MG tablet Take 750 mg by mouth every 8 (eight) hours as needed for muscle spasms.    [provider]  metoprolol succinate (TOPROL-XL) 25 MG 24 hr tablet Take 25 mg by mouth daily.  04/05/19   [provider]  morphine 20 MG/5ML solution Take 0.03 mg by mouth See admin instructions. Taking 0.33m every 2 hours as needed for pain 07/10/19   [provider]  Multiple Vitamin (MULTIVITAMIN) tablet Take 1 tablet by mouth daily.     [provider]  nitroGLYCERIN (NITROSTAT) 0.4 MG SL tablet Place 1 tablet (0.4 mg total) under the tongue every 5 (five) minutes as needed for chest pain (may repeat x3). 07/22/19   WMartyn Ehrich NP  OXYGEN 1 each by Other route See admin instructions. Use 4L 24/7    [provider]  pantoprazole (PROTONIX) 40 MG tablet Take 40 mg by mouth daily. 05/05/19   [provider]  potassium chloride SA (KLOR-CON M20) 20 MEQ tablet Take 1 tablet (20 mEq total) by mouth daily. 07/01/19   NJosue Hector MD  predniSONE (DELTASONE) 20 MG tablet Prednisone 60 mg daily for 3 days followed by  Prednisone 40 mg daily for 3 days followed by  Prednisone 20 mg daily for  3 days followed by  Prednisone 10 mg daily for 3 days. 09/23/19   AHosie Poisson MD  Respiratory Therapy Supplies (FLUTTER) DEVI Use as directed Patient taking differently: 1 each by Other route as directed.  07/13/17   WTanda Rockers MD  Tiotropium Bromide Monohydrate (SPIRIVA RESPIMAT) 2.5 MCG/ACT AERS Inhale 2 puffs into the lungs daily. 10/26/16   Parrett, TFonnie Mu NP    VITAL SIGNS:  Blood pressure (!) 154/83, pulse (!) 112, temperature (!) 100.4 F (38 C), temperature source Oral, resp. rate (!) 26, SpO2 95 %. PHYSICAL EXAMINATION:  Physical Exam  GENERAL:  69y.o.-year-old patient lying in the bed with no acute distress.  EYES: Pupils equal, round, reactive to light and accommodation. No scleral icterus. Extraocular muscles intact.  HEENT: Head atraumatic, normocephalic. Oropharynx and nasopharynx clear.  NECK:  Supple, no jugular venous distention. No thyroid enlargement, no tenderness.  LUNGS: Decreased breath sounds bilaterally, mild expiratory wheezing, no rales,rhonchi or crepitation.  She is using accessory muscles of respiration.  CARDIOVASCULAR: S1, S2 normal. No murmurs, rubs, or gallops.  ABDOMEN: Soft, nontender, nondistended. Bowel sounds present. No organomegaly or mass.  EXTREMITIES: No pedal edema, cyanosis, or clubbing.  NEUROLOGIC: Cranial nerves II through XII are intact. Muscle strength 5/5 in all extremities. Sensation intact. Gait not checked.  PSYCHIATRIC: The patient is alert and oriented x 3.  SKIN: No obvious rash, lesion, or ulcer.  LABORATORY PANEL:   CBC Recent Labs  Lab 10/10/19 1251  WBC 7.2  HGB 9.8*  HCT 33.9*  PLT 125*   ------------------------------------------------------------------------------------------------------------------  Chemistries  Recent Labs  Lab 10/10/19 1251  NA 137  K 4.1  CL 90*  CO2 39*  GLUCOSE 283*  BUN 17  CREATININE  0.70  CALCIUM 8.7*  AST 17  ALT 20  ALKPHOS 75  BILITOT 0.4   ------------------------------------------------------------------------------------------------------------------  Cardiac Enzymes No results for input(s): TROPONINI in the last 168  hours. ------------------------------------------------------------------------------------------------------------------  RADIOLOGY:  DG Chest 2 View  Result Date: 10/09/2019 CLINICAL DATA:  Worsening shortness of breath today. History of COPD. EXAM: CHEST - 2 VIEW COMPARISON:  09/20/2019 FINDINGS: Heart size is normal. Aortic atherosclerosis as seen previously. Chronic interstitial lung markings at least partly due to chronic lung disease. There may be an element of superimposed interstitial edema. No advanced edema. No effusions. No acute bone finding. Old augmented lower thoracic fracture. IMPRESSION: Chronic lung disease. Some interstitial markings related to that. There could be an element of superimposed acute interstitial edema as well. Electronically Signed   By: Nelson Chimes M.D.   On: 10/09/2019 20:42   DG Chest Port 1 View  Result Date: 10/10/2019 CLINICAL DATA:  Respiratory distress, shortness of breath EXAM: PORTABLE CHEST 1 VIEW COMPARISON:  Portable exam 1242 hours compared to 10/09/2019 FINDINGS: Normal heart size, mediastinal contours, and pulmonary vascularity. Coronary stent noted. Atherosclerotic calcification aorta. Emphysematous changes with prominent interstitial markings at both lung bases unchanged since previous exam as well as an earlier study of 06/08/2019. No definite acute infiltrate, pleural effusion or pneumothorax. Bones demineralized. IMPRESSION: Changes of COPD and stable appearing chronic interstitial lung disease. No acute abnormalities. Electronically Signed   By: Lavonia Dana M.D.   On: 10/10/2019 13:05   IMPRESSION AND PLAN:  69 year old female with known history of hypertension, diastolic heart failure, coronary artery disease, breast cancer status post lumpectomy and radiation treatment, type 2 diabetes, chronic respiratory failure with hypoxia requiring 4 L oxygen via nasal cannula with COPD is being admitted for acute on chronic hypoxic respiratory failure due  to COPD exacerbation.  Acute on chronic hypoxic and hypercapnic respiratory failure Present on admission due to COPD exacerbation Patient has had 4 readmissions in last 62-month   We will start her on BiPAP, wean to nasal cannula oxygen as able DuoNeb, IV Solu-Medrol, empiric antibiotics -Please note patient does have some fever and tachycardia while in the ED, I will check sputum culture and respiratory virus panel.  Covid is negative Start inhalers  Paroxysmal A. Fib Rate well controlled, on long-term chronic anticoagulation with Eliquis  Essential hypertension Continue home medications.  Blood pressure stable  Chronic diastolic CHF Well compensated at this time  Type 2 diabetes Sliding scale insulin for now, consult diabetic nurse coordinator  I have discussed her hospice situation with her daughter.  Daughter feels very strongly that she would like all care possible while here she is planning to call her hospice agency to notify about her hospitalization and receiving full care while here.  She is agreeable with DNR.  All the records are reviewed and case discussed with ED provider. Management plans discussed with the patient, family (daughter at bedside) and they are in agreement.  CODE STATUS: DNR  TOTAL TIME TAKING CARE OF THIS PATIENT: 45 minutes.    VMax SaneM.D on 10/10/2019 at 3:34 PM  [facility]   CC: Primary care physician; BBernerd Limbo MD   Note: This dictation was prepared with Dragon dictation along with smaller phrase technology. Any transcriptional errors that result from this process are unintentional.

## 2019-10-10 NOTE — ED Provider Notes (Signed)
Patricia Wall Provider Note   CSN: [id number] Arrival date & time: 10/10/19  1153     History Chief Complaint  Patient presents with  . Shortness of Breath    Patricia Wall is a 69 y.o. female.  Patient complains of shortness of breath.  Patient has a history of severe COPD and she is a hospice patient   Shortness of Breath      Past Medical History:  Diagnosis Date  . Abnormal chest xray   . ABUSE, ALCOHOL, UNSPECIFIED 09/04/2006   Qualifier: Diagnosis of  By: Amil Amen MD, Benjamine Mola    . Acute diastolic CHF (congestive heart failure) ([place])   . Acute on chronic respiratory failure with hypercapnia ([place])   . Acute respiratory failure with hypoxia ([place]) 01/07/2015  . Alcohol abuse   . Angina, class III ([place]) 03/29/2007   Qualifier: Diagnosis of  By: Amil Amen MD, Benjamine Mola    . ARF (acute renal failure) ([place]) 04/27/2019  . Back pain   . CAD (coronary artery disease)     Prev seen by [facility].  Stent to OM and LAD Last cath 6/11 cathed on June 13, [name]. 2011.  The cardiac catheterization showed 30% and 50% lesions in the mid  and distal LAD, 50% in the OM1, 40% in the OM2 with 20% in-stent  restenosis, 40% in-stent restenosis in the circumflex and no significant  disease in the RCA.  Her EF was normal.  Dr. Olevia Perches recommended a trial  of proton pump inhibitors   . CAD S/P percutaneous coronary angioplasty: Prior Cypher DES to pLAD, pOM2 & AVG Cx (after Om2); New DES to AVG Cx ISR & Angiosculpt PTCA of Ostial AVG Cx from OM2. 09/04/2006   Qualifier: Diagnosis of  By: Amil Amen MD, Benjamine Mola    . Chest pain   . COPD (chronic obstructive pulmonary disease) ([place])   . COPD GOLD II / still smoking  09/04/2006   Followed in Pulmonary clinic/ [facility]/ [place]  Active smoker  - pft's 12/12/12 FEV1  1.41 (53%) ratio 54 with no significant reversibility and DLCO 47% corrects to 50 01/16/2013  Walked RA x 2 laps @ 185 ft each stopped due to sob, no desat      - Trial of anoro 01/16/2013 > helped but insurance would not pay - incruse one daily started 10/02/2013 >>>not covered  -Spiriva restarted 10/15/13 >  . Cyanosis   . DDD (degenerative disc disease)   . DEGENERATIVE DISC DISEASE, CERVICAL SPINE 09/04/2006   Annotation: c6-7 on the right Qualifier: Diagnosis of  By: Amil Amen MD, Benjamine Mola    . Depression   . Diarrhea   . Diverticular disease   . DIVERTICULOSIS, COLON 09/04/2006   Qualifier: Diagnosis of  By: Amil Amen MD, Benjamine Mola    . GERD (gastroesophageal reflux disease)   . H/O: hysterectomy   . Hemorrhoids   . HTN (hypertension)   . IBS (irritable bowel syndrome)   . Insomnia   . Irritable bowel syndrome 09/04/2006   Qualifier: Diagnosis of  By: Amil Amen MD, Benjamine Mola    . Lymphadenitis   . PEPTIC ULCER DISEASE 09/04/2006   Annotation: distant history of  Qualifier: Diagnosis of  By: Amil Amen MD, Benjamine Mola    . Pharyngitis   . PHARYNGITIS 07/18/2007   Qualifier: Diagnosis of  By: Radene Ou MD, [place]    . PUD (peptic ulcer disease)   . PVD (peripheral vascular disease) ([place])   . Sinusitis   . TIA (transient ischemic attack)   .  Tobacco abuse   . Vertigo     Patient Active Problem List   Diagnosis Date Noted  . Acute on chronic respiratory failure with hypoxia and hypercapnia ([place]) 09/04/2019  . DNR (do not resuscitate) 09/04/2019  . ARF (acute renal failure) ([place]) 04/27/2019  . Hypotension 04/27/2019  . Diabetes mellitus type 2 in nonobese ([place]) 04/27/2019  . Pressure injury of skin 04/23/2019  . Acute on chronic respiratory failure with hypercapnia ([place])   . SOB (shortness of breath)   . Acute diastolic CHF (congestive heart failure) ([place])   . Exertional angina (H[facility])   . Atrial fibrillation with RVR ([place])   . Normocytic anemia 04/18/2019  . Thrombocytopenia ([place]) 04/18/2019  . PAF (paroxysmal atrial fibrillation) ([place]) 04/18/2019  . Chronic respiratory failure with hypoxia and hypercapnia ([place]) 03/29/2018  . Acute  respiratory failure with hypoxia ([place]) 01/07/2015  . CAD (coronary artery disease) 01/07/2015  . Obesity 11/01/2014  . COPD exacerbation ([place]) 12/30/2013  . Chest pain 12/03/2013  . Atherosclerotic heart disease of native coronary artery with angina pectoris ([place]) 12/03/2013    Class: Diagnosis of  . Syncope 12/02/2013  . Dyspnea 10/02/2013  . Acute upper respiratory infections of unspecified site 04/03/2013  . Right carotid bruit 11/15/2012  . Peripheral vascular disease ([place]) 09/30/2009  . Nonspecific (abnormal) findings on radiological and other examination of body structure 09/02/2009  . ABNORMAL CHEST XRAY 09/02/2009  . TINNITUS 04/15/2008  . COUGH 04/15/2008  . VERTIGO 02/04/2008  . PHARYNGITIS 07/18/2007  . LYMPHADENITIS, CERVICAL 07/18/2007  . Angina, class III ([place]) 03/29/2007  . CONDYLOMA ACUMINATUM 03/01/2007  . SINUSITIS, ACUTE 03/01/2007  . INSOMNIA 01/30/2007  . Elevated lipids 09/04/2006  . ABUSE, ALCOHOL, UNSPECIFIED 09/04/2006  . Cigarette smoker 09/04/2006  . DEPRESSION 09/04/2006  . Essential hypertension 09/04/2006  . CAD S/P percutaneous coronary angioplasty: Prior Cypher DES to pLAD, pOM2 & AVG Cx (after Om2); New DES to AVG Cx ISR & Angiosculpt PTCA of Ostial AVG Cx from OM2. 09/04/2006  . HEMORRHOIDS 09/04/2006  . COPD GOLD II / still smoking  09/04/2006  . GERD 09/04/2006  . PEPTIC ULCER DISEASE 09/04/2006  . DIVERTICULOSIS, COLON 09/04/2006  . Irritable bowel syndrome 09/04/2006  . DEGENERATIVE DISC DISEASE, CERVICAL SPINE 09/04/2006  . LOW BACK PAIN 09/04/2006  . HEADACHE 09/04/2006  . History of cardiovascular disorder 09/04/2006  . ONYCHOMYCOSIS, TOENAILS 09/01/2006  . DIARRHEA 02/18/2006    Past Surgical History:  Procedure Laterality Date  . ABDOMINAL HYSTERECTOMY    . APPENDECTOMY    . BREAST LUMPECTOMY Left 2015  . CARDIAC CATHETERIZATION  2005, 2006, 2009,2011  . CHOLECYSTECTOMY    . CORONARY STENT INTERVENTION N/A 04/22/2019    Procedure: CORONARY STENT INTERVENTION;  Surgeon: Troy Sine, MD;  Location: [facility];  Service: Cardiovascular;  Laterality: N/A;  . CORONARY STENT PLACEMENT     Status post balloon angiogram plasty and Cypher stent to the distal    circumflex and OM2 branch  . ESOPHAGOGASTRODUODENOSCOPY (EGD) WITH PROPOFOL Left 04/21/2019   Procedure: ESOPHAGOGASTRODUODENOSCOPY (EGD) WITH PROPOFOL;  Surgeon: Arta Silence, MD;  Location: [facility];  Service: Endoscopy;  Laterality: Left;  . Hammertoe repair    . Left Bunionectomy    . LEFT HEART CATH AND CORONARY ANGIOGRAPHY N/A 04/22/2019   Procedure: LEFT HEART CATH AND CORONARY ANGIOGRAPHY;  Surgeon: Troy Sine, MD;  Location: [facility];  Service: Cardiovascular;  Laterality: N/A;  . LEFT HEART CATHETERIZATION WITH CORONARY ANGIOGRAM N/A 12/03/2013  Procedure: LEFT HEART CATHETERIZATION WITH CORONARY ANGIOGRAM;  Surgeon: Leonie Man, MD;  Location: [facility];  Service: Cardiovascular;  Laterality: N/A;  . TUBAL LIGATION       OB History   No obstetric history on file.     Family History  Problem Relation Age of Onset  . Heart disease Mother   . Rheumatologic disease Mother   . Cancer - Other Mother        Uterine  . Emphysema Father   . Heart disease Father   . Cancer - Other Father   . Coronary artery disease Other   . Asthma Sister        2 sisters  . Stroke Sister     Social History   Tobacco Use  . Smoking status: Former Smoker    Packs/day: 0.25    Years: 59.00    Pack years: 14.75    Types: Cigarettes  . Smokeless tobacco: Never Used  Substance Use Topics  . Alcohol use: No    Alcohol/week: 0.0 standard drinks  . Drug use: Yes    Comment: Seldom Marijuana use- 1Xmonth    Home Medications Prior to Admission medications   Medication Sig Start Date End Date Taking? Authorizing Provider  acetaminophen (TYLENOL) 325 MG tablet Take 2 tablets (650 mg total) by mouth every 4 (four) hours as  needed for headache or mild pain. 04/23/19   Raiford Noble Latif, DO  acidophilus (RISAQUAD) CAPS capsule Take 1 capsule by mouth 2 (two) times daily. 04/09/19   [provider]  albuterol (PROVENTIL) (2.5 MG/3ML) 0.083% nebulizer solution USE 1 VIAL IN NEBULIZER EVERY 4 HOURS FOR WHEEZING OR SHORTNESS OF BREATH. Patient taking differently: Take 2.5 mg by nebulization every 4 (four) hours as needed for wheezing or shortness of breath.  04/29/19   Tanda Rockers, MD  albuterol (VENTOLIN HFA) 108 (90 Base) MCG/ACT inhaler Inhale 2 puffs into the lungs every 6 (six) hours as needed for wheezing or shortness of breath. 07/22/19   Martyn Ehrich, NP  ALPRAZolam Duanne Moron) 0.25 MG tablet Take 1 tablet (0.25 mg total) by mouth 2 (two) times daily as needed for anxiety. 09/23/19   Hosie Poisson, MD  apixaban (ELIQUIS) 5 MG TABS tablet Take 1 tablet (5 mg total) by mouth 2 (two) times daily. 05/30/19   Josue Hector, MD  atorvastatin (LIPITOR) 80 MG tablet Take 80 mg by mouth daily.    [provider]  blood glucose meter kit and supplies Dispense based on patient and insurance preference. Use up to four times daily as directed. (FOR ICD-9 250.00, 250.01). Patient taking differently: 1 each by Other route See admin instructions. Dispense based on patient and insurance preference. Use up to four times daily as directed. (FOR ICD-9 250.00, 250.01). 01/13/15   Reyne Dumas, MD  calcium carbonate (OS-CAL) 600 MG TABS Take 600 mg by mouth every morning.     [provider]  Cholecalciferol (VITAMIN D) 2000 UNITS tablet Take 2,000 Units by mouth every morning.     [provider]  clopidogrel (PLAVIX) 75 MG tablet Take 1 tablet (75 mg total) by mouth daily with breakfast. 05/30/19   Burtis Junes, NP  diltiazem (CARDIZEM CD) 120 MG 24 hr capsule Take 120 mg by mouth daily.  04/05/19   [provider]  donepezil (ARICEPT) 5 MG tablet Take 5 mg by mouth daily.  01/10/18    [provider]  DULoxetine (CYMBALTA) 60 MG capsule  Take 60 mg by mouth every morning.     [provider]  furosemide (LASIX) 40 MG tablet Take 40 mg by mouth every other day. 07/18/19   [provider]  gabapentin (NEURONTIN) 300 MG capsule Take 300 mg by mouth 3 (three) times daily.     [provider]  ipratropium-albuterol (DUONEB) 0.5-2.5 (3) MG/3ML SOLN Take 3 mLs by nebulization in the morning, at noon, in the evening, and at bedtime. 09/10/19   Tanda Rockers, MD  Lancets (FREESTYLE) lancets Use as instructed Patient taking differently: 1 each by Other route as directed.  01/13/15   Reyne Dumas, MD  letrozole [facility]) 2.5 MG tablet Take 2.5 mg by mouth every morning.     [provider]  lidocaine (LIDODERM) 5 % Place 1 patch onto the skin as needed (pain).  08/16/19 11/14/19  [provider]  metFORMIN (GLUCOPHAGE) 1000 MG tablet Take 1,000 mg by mouth 2 (two) times daily. 07/09/19   [provider]  methocarbamol (ROBAXIN) 750 MG tablet Take 750 mg by mouth every 8 (eight) hours as needed for muscle spasms.    [provider]  metoprolol succinate (TOPROL-XL) 25 MG 24 hr tablet Take 25 mg by mouth daily.  04/05/19   [provider]  morphine 20 MG/5ML solution Take 0.03 mg by mouth See admin instructions. Taking 0.32m every 2 hours as needed for pain 07/10/19   [provider]  Multiple Vitamin (MULTIVITAMIN) tablet Take 1 tablet by mouth daily.     [provider]  nitroGLYCERIN (NITROSTAT) 0.4 MG SL tablet Place 1 tablet (0.4 mg total) under the tongue every 5 (five) minutes as needed for chest pain (may repeat x3). 07/22/19   WMartyn Ehrich NP  OXYGEN 1 each by Other route See admin instructions. Use 4L 24/7    [provider]  pantoprazole (PROTONIX) 40 MG tablet Take 40 mg by mouth daily. 05/05/19   [provider]  potassium chloride SA (KLOR-CON M20) 20 MEQ tablet  Take 1 tablet (20 mEq total) by mouth daily. 07/01/19   NJosue Hector MD  Respiratory Therapy Supplies (FLUTTER) DEVI Use as directed Patient taking differently: 1 each by Other route as directed.  07/13/17   WTanda Rockers MD  Tiotropium Bromide Monohydrate (SPIRIVA RESPIMAT) 2.5 MCG/ACT AERS Inhale 2 puffs into the lungs daily. 10/26/16   Parrett, TFonnie Mu NP  valACYclovir (VALTREX) 1000 MG tablet Take 1,000 mg by mouth 3 (three) times daily. 10/02/19   [provider]    Allergies    Patient has no known allergies.  Review of Systems   Review of Systems  Respiratory: Positive for shortness of breath.     Physical Exam Updated Vital Signs BP (!) 117/103   Pulse (!) 110   Temp (!) 100.4 F (38 C) (Oral)   Resp (!) 27   SpO2 95%   Physical Exam  ED Results / Procedures / Treatments   Labs (all labs ordered are listed, but only abnormal results are displayed) Labs Reviewed  CBC WITH DIFFERENTIAL/PLATELET - Abnormal; Notable for the following components:      Result Value   RBC 3.50 (*)    Hemoglobin 9.8 (*)    HCT 33.9 (*)    MCHC 28.9 (*)    Platelets 125 (*)    All other components within normal limits  COMPREHENSIVE METABOLIC PANEL - Abnormal; Notable for the following components:   Chloride 90 (*)  CO2 39 (*)    Glucose, Bld 283 (*)    Calcium 8.7 (*)    Total Protein 6.4 (*)    Albumin 3.3 (*)    All other components within normal limits  BLOOD GAS, ARTERIAL - Abnormal; Notable for the following components:   pH, Arterial 7.272 (*)    pCO2 arterial 84.5 (*)    pO2, Arterial 74.8 (*)    Bicarbonate 37.7 (*)    Acid-Base Excess 8.6 (*)    All other components within normal limits  SARS CORONAVIRUS 2 BY RT PCR (HOSPITAL ORDER, [facility])  EXPECTORATED SPUTUM ASSESSMENT W REFEX TO RESP CULTURE  HIV ANTIBODY (ROUTINE TESTING W REFLEX)    EKG None  Radiology DG Chest 2 View  Result Date: 10/09/2019 CLINICAL DATA:   Worsening shortness of breath today. History of COPD. EXAM: CHEST - 2 VIEW COMPARISON:  09/20/2019 FINDINGS: Heart size is normal. Aortic atherosclerosis as seen previously. Chronic interstitial lung markings at least partly due to chronic lung disease. There may be an element of superimposed interstitial edema. No advanced edema. No effusions. No acute bone finding. Old augmented lower thoracic fracture. IMPRESSION: Chronic lung disease. Some interstitial markings related to that. There could be an element of superimposed acute interstitial edema as well. Electronically Signed   By: Nelson Chimes M.D.   On: 10/09/2019 20:42   DG Chest Port 1 View  Result Date: 10/10/2019 CLINICAL DATA:  Respiratory distress, shortness of breath EXAM: PORTABLE CHEST 1 VIEW COMPARISON:  Portable exam 1242 hours compared to 10/09/2019 FINDINGS: Normal heart size, mediastinal contours, and pulmonary vascularity. Coronary stent noted. Atherosclerotic calcification aorta. Emphysematous changes with prominent interstitial markings at both lung bases unchanged since previous exam as well as an earlier study of 06/08/2019. No definite acute infiltrate, pleural effusion or pneumothorax. Bones demineralized. IMPRESSION: Changes of COPD and stable appearing chronic interstitial lung disease. No acute abnormalities. Electronically Signed   By: Lavonia Dana M.D.   On: 10/10/2019 13:05    Procedures Procedures (including critical care time)  Medications Ordered in ED Medications  calcium carbonate (OS-CAL) tablet 600 mg (has no administration in time range)  DULoxetine (CYMBALTA) DR capsule 60 mg (has no administration in time range)  multivitamin tablet 1 tablet (has no administration in time range)  letrozole [facility]) tablet 2.5 mg (has no administration in time range)  Vitamin D 2,000 Units (has no administration in time range)  gabapentin (NEURONTIN) capsule 300 mg (has no administration in time range)  Tiotropium Bromide  Monohydrate AERS 2 puff (has no administration in time range)  methocarbamol (ROBAXIN) tablet 750 mg (has no administration in time range)  donepezil (ARICEPT) tablet 5 mg (has no administration in time range)  atorvastatin (LIPITOR) tablet 80 mg (has no administration in time range)  diltiazem (CARDIZEM CD) 24 hr capsule 120 mg (has no administration in time range)  metoprolol succinate (TOPROL-XL) 24 hr tablet 25 mg (has no administration in time range)  acidophilus (RISAQUAD) capsule 1 capsule (has no administration in time range)  acetaminophen (TYLENOL) tablet 650 mg (has no administration in time range)  pantoprazole (PROTONIX) EC tablet 40 mg (has no administration in time range)  apixaban (ELIQUIS) tablet 5 mg (has no administration in time range)  clopidogrel (PLAVIX) tablet 75 mg (has no administration in time range)  potassium chloride SA (KLOR-CON) CR tablet 20 mEq (has no administration in time range)  albuterol (VENTOLIN HFA) 108 (90 Base) MCG/ACT inhaler 2 puff (  has no administration in time range)  nitroGLYCERIN (NITROSTAT) SL tablet 0.4 mg (has no administration in time range)  furosemide (LASIX) tablet 40 mg (has no administration in time range)  metFORMIN (GLUCOPHAGE) tablet 1,000 mg (has no administration in time range)  ALPRAZolam (XANAX) tablet 0.25 mg (has no administration in time range)  sodium chloride flush (NS) 0.9 % injection 3 mL (has no administration in time range)  sodium chloride flush (NS) 0.9 % injection 3 mL (has no administration in time range)  0.9 %  sodium chloride infusion (has no administration in time range)  ceFEPIme (MAXIPIME) 2 g in sodium chloride 0.9 % 100 mL IVPB (has no administration in time range)  budesonide (PULMICORT) nebulizer solution 2 mg (has no administration in time range)  methylPREDNISolone sodium succinate (SOLU-MEDROL) 40 mg/mL injection 40 mg (has no administration in time range)    Followed by  predniSONE (DELTASONE) tablet  40 mg (has no administration in time range)  fluticasone furoate-vilanterol (BREO ELLIPTA) 100-25 MCG/INH 1 puff (has no administration in time range)  umeclidinium-vilanterol (ANORO ELLIPTA) 62.5-25 MCG/INH 1 puff (has no administration in time range)  magnesium sulfate IVPB 2 g 50 mL (0 g Intravenous Stopped 10/10/19 1354)    ED Course  I have reviewed the triage vital signs and the nursing notes.  Pertinent labs & imaging results that were available during my care of the patient were reviewed by me and considered in my medical decision making (see chart for details). CRITICAL CARE Performed by: Milton Ferguson Total critical care time: 45 minutes Critical care time was exclusive of separately billable procedures and treating other patients. Critical care was necessary to treat or prevent imminent or life-threatening deterioration. Critical care was time spent personally by me on the following activities: development of treatment plan with patient and/or surrogate as well as nursing, discussions with consultants, evaluation of patient's response to treatment, examination of patient, obtaining history from patient or surrogate, ordering and performing treatments and interventions, ordering and review of laboratory studies, ordering and review of radiographic studies, pulse oximetry and re-evaluation of patient's condition.    MDM Rules/Calculators/A&P                          Patient with severe COPD and CO2 retention.  She will placed on BiPAP and admitted to medicine Final Clinical Impression(s) / ED Diagnoses Final diagnoses:  COPD exacerbation [facility])    Rx / DC Orders ED Discharge Orders    None       Milton Ferguson, MD 10/14/19 [id number]

## 2019-10-10 NOTE — ED Triage Notes (Signed)
She c/o "feels like crap". She is a Hospice pt. With [place] [place] [facility]. She arrives awake, alert, moderately short of breath and in no distress.

## 2019-10-11 DIAGNOSIS — R6510 Systemic inflammatory response syndrome (SIRS) of non-infectious origin without acute organ dysfunction: Secondary | ICD-10-CM | POA: Diagnosis present

## 2019-10-11 LAB — GLUCOSE, CAPILLARY
Glucose-Capillary: 251 mg/dL — ABNORMAL HIGH (ref 70–99)
Glucose-Capillary: 402 mg/dL — ABNORMAL HIGH (ref 70–99)
Glucose-Capillary: 349 mg/dL — ABNORMAL HIGH (ref 70–99)
Glucose-Capillary: 206 mg/dL — ABNORMAL HIGH (ref 70–99)
Glucose-Capillary: 192 mg/dL — ABNORMAL HIGH (ref 70–99)

## 2019-10-11 LAB — BLOOD GAS, ARTERIAL
Acid-Base Excess: 8.7 mmol/L — ABNORMAL HIGH (ref 0.0–2.0)
Bicarbonate: 35.2 mmol/L — ABNORMAL HIGH (ref 20.0–28.0)
O2 Saturation: 94.6 %
Patient temperature: 98.6
pCO2 arterial: 62.6 mmHg — ABNORMAL HIGH (ref 32.0–48.0)
pH, Arterial: 7.369 (ref 7.350–7.450)
pO2, Arterial: 72 mmHg — ABNORMAL LOW (ref 83.0–108.0)

## 2019-10-11 LAB — HIV ANTIBODY (ROUTINE TESTING W REFLEX): HIV Screen 4th Generation wRfx: NONREACTIVE

## 2019-10-11 MED ORDER — ALPRAZOLAM 0.25 MG PO TABS
0.2500 mg | ORAL_TABLET | Freq: Three times a day (TID) | ORAL | Status: DC | PRN
  Administered 2019-10-12 – 2019-10-14 (×3): 0.25 mg via ORAL
  Filled 2019-10-11 (×3): qty 1

## 2019-10-11 MED ORDER — UMECLIDINIUM BROMIDE 62.5 MCG/INH IN AEPB
1.0000 | INHALATION_SPRAY | Freq: Every day | RESPIRATORY_TRACT | Status: DC
  Administered 2019-10-11 – 2019-10-15 (×5): 1 via RESPIRATORY_TRACT
  Filled 2019-10-11: qty 7

## 2019-10-11 MED ORDER — ENSURE MAX PROTEIN PO LIQD
11.0000 [oz_av] | Freq: Every day | ORAL | Status: DC
  Filled 2019-10-11 (×5): qty 330

## 2019-10-11 MED ORDER — GUAIFENESIN-DM 100-10 MG/5ML PO SYRP
10.0000 mL | ORAL_SOLUTION | Freq: Four times a day (QID) | ORAL | Status: DC
  Administered 2019-10-11 – 2019-10-15 (×18): 10 mL via ORAL
  Filled 2019-10-11 (×18): qty 10

## 2019-10-11 MED ORDER — HYDROCOD POLST-CPM POLST ER 10-8 MG/5ML PO SUER
5.0000 mL | Freq: Two times a day (BID) | ORAL | Status: DC
  Administered 2019-10-11 – 2019-10-15 (×9): 5 mL via ORAL
  Filled 2019-10-11 (×9): qty 5

## 2019-10-11 MED ORDER — INSULIN ASPART 100 UNIT/ML ~~LOC~~ SOLN
0.0000 [IU] | Freq: Three times a day (TID) | SUBCUTANEOUS | Status: DC
  Administered 2019-10-11: 7 [IU] via SUBCUTANEOUS
  Administered 2019-10-11: 5 [IU] via SUBCUTANEOUS
  Administered 2019-10-11: 3 [IU] via SUBCUTANEOUS
  Administered 2019-10-12: 1 [IU] via SUBCUTANEOUS
  Administered 2019-10-12 (×2): 3 [IU] via SUBCUTANEOUS
  Administered 2019-10-13: 5 [IU] via SUBCUTANEOUS

## 2019-10-11 NOTE — Progress Notes (Addendum)
   10/10/19 2200  Vitals  Temp 98.9 F (37.2 C)  BP (!) 148/83  MAP (mmHg) 102  Pulse Rate (!) 110  ECG Heart Rate (!) 111  Resp 19  Level of Consciousness  Level of Consciousness Alert  MEWS COLOR  MEWS Score Color Yellow  Oxygen Therapy  SpO2 95 %  O2 Device Nasal Cannula  O2 Flow Rate (L/min) 4 L/min  Pain Assessment  Pain Scale 0-10  Pain Score 0  MEWS Score  MEWS Temp 0  MEWS Systolic 0  MEWS Pulse 2  MEWS RR 0  MEWS LOC 0  MEWS Score 2  Yellow MEWs started. Conservation officer, historic buildings. Will continue to monitor.

## 2019-10-11 NOTE — Progress Notes (Signed)
PROGRESS NOTE    Patient: [name]                            PCP: Bernerd Limbo, MD                    DOB: Apr 24, 1950            DOA: 10/10/2019 ZTI:[id number]             DOS: 10/11/2019, 12:42 PM   LOS: 1 day   Date of Service: The patient was seen and examined on 10/11/2019  Subjective:   The patient was seen and examined this morning, has been weaned off BiPAP to 4 L of O2 O2 via nasal cannula. Still visibly shortness of breath, she has to stop multiple times before completing sentence. Otherwise awake alert, following commands Denies any chest pain.  No issues overnight.   Brief Narrative:   Aroura Vasudevan  is a 69 y.o. female with a known history of hypertension, diastolic heart failure, coronary artery disease, breast cancer status post lumpectomy and radiation treatment, type 2 diabetes, chronic respiratory failure with hypoxia requiring 4 L oxygen via nasal cannula with COPD is being admitted for acute on chronic hypoxic respiratory failure due to COPD exacerbation.  Patient was recently admitted from July 2 till July 5 of 2021.  She has had 4 readmissions in last 6-month    Her daughter reports that patient has had this issue for a long time and typically requires 2 to 3 days of hospitalization, BiPAP and she normally responds well to that.   She was discharged with [facility] on last admission and has been monitored by her pulmonologist as an outpatient.  Her daughter request we put her on BiPAP immediately and give her some anxiety medicine   Assessment & Plan:   Active Problems:   Acute respiratory failure with hypoxia (HCC)   Acute on chronic hypoxic and hypercapnic respiratory failure -Due to COPD exacerbation History of COPD seems to be close to end-stage COPD, high flow oxygen demand, multiple hospitalization -Multiple admission, for admission in the past 6 months -ABG on admission 7.27/84.5/74.8 ABG this morning 7.369/62.6/72.0 -Patient was placed on  BiPAP, has been weaned off currently on 4 L of oxygen, satting greater 92% -We will try to maintain O2 sat 88-92% adjust O2 demand accordingly -Started on IV Solu-Medrol, will initiate tapering down steroids -Continue empiric antibiotics -  -RT consulted continue DuoNeb bronchodilator treatments, mucolytic's,  -SARS-CoV-2 negative x2 -We will try to obtain sputum for culture sensitivity -Respiratory viral panel >>> all negative  SIRS/ruling out sepsis -Patient met early SIRS criteria with T-max 100.4, tachypneic, tachycardic, with respiratory failure But mostly above findings are more consistent with chronic respiratory failure -Nevertheless viral panel pending, blood cultures been obtained -Empiric antibiotics broad-spectrum antibiotics of cefepime initiated --will be continued for now -10/11/2019 patient has much improved, afebrile, normotensive mild tachycardic likely due to treat breathing treatments-chronically tachypneic due to chronic respiratory status Lactic acid would not be reliable due to chronic respiratory failure, hypoxia  Paroxysmal A. Fib -Stable, rate controlled -We will continue Eliquis  Essential hypertension -Remained stable, continue home meds  Hyperlipidemia  -Continue statins  chronic diastolic CHF Well compensated at this time  Type 2 diabetes Sliding scale insulin for now, consult diabetic nurse coordinator  We discussed her hospice situation with her daughter.  Daughter feels very strongly that she would like all care  possible while here she is planning to call her hospice agency to notify about her hospitalization and receiving full care while here.   She is agreeable with DNR.   CODE STATUS: DNR      Nutritional status:          Cultures; Blood Cultures x 2 >> NGT Respiratory panel >> all negative   Antimicrobials: 10/10/2019 IV antibiotics cefepime   Consultants:      ------------------------------------------------------------------------------------------------------------------------------------------------  DVT prophylaxis:  EE:FEOFHQR Code Status:   Code Status: DNR Family Communication: No family member present at bedside- attempt will be made to update daily The above findings and plan of care has been discussed with patient in detail,  they expressed understanding and agreement of above. -Advance care planning has been discussed.   Admission status:    Status is: Inpatient  Remains inpatient appropriate because:Inpatient level of care appropriate due to severity of illness   Dispo: The patient is from: Home              Anticipated d/c is to: Home              Anticipated d/c date is: 3 days              Patient currently is not medically stable to d/c.        Procedures:   No admission procedures for hospital encounter.     Antimicrobials:  Anti-infectives (From admission, onward)   Start     Dose/Rate Route Frequency Ordered Stop   10/10/19 1630  ceFEPIme (MAXIPIME) 2 g in sodium chloride 0.9 % 100 mL IVPB     Discontinue     2 g 200 mL/hr over 30 Minutes Intravenous Every 8 hours 10/10/19 1448 10/15/19 1359       Medication:  . acidophilus  1 capsule Oral BID  . apixaban  5 mg Oral BID  . atorvastatin  80 mg Oral Daily  . budesonide (PULMICORT) nebulizer solution  0.5 mg Nebulization BID  . calcium carbonate  1,250 mg Oral Daily  . chlorpheniramine-HYDROcodone  5 mL Oral Q12H  . cholecalciferol  2,000 Units Oral Daily  . clopidogrel  75 mg Oral Q breakfast  . diltiazem  120 mg Oral Daily  . donepezil  5 mg Oral Daily  . DULoxetine  60 mg Oral Daily  . fluticasone furoate-vilanterol  1 puff Inhalation Daily  . furosemide  40 mg Oral QODAY  . gabapentin  300 mg Oral TID  . guaiFENesin-dextromethorphan  10 mL Oral Q6H  . insulin aspart  0-9 Units Subcutaneous TID WC  . letrozole  2.5 mg Oral Daily  .  metFORMIN  1,000 mg Oral BID WC  . metoprolol succinate  25 mg Oral Daily  . multivitamin with minerals  1 tablet Oral Daily  . pantoprazole  40 mg Oral Daily  . potassium chloride SA  20 mEq Oral Daily  . [START ON 10/12/2019] predniSONE  40 mg Oral Q breakfast  . sodium chloride flush  3 mL Intravenous Q12H  . umeclidinium bromide  1 puff Inhalation Daily    sodium chloride, sodium chloride, acetaminophen, albuterol, ALPRAZolam, methocarbamol, nitroGLYCERIN, sodium chloride flush   Objective:   Vitals:   10/11/19 0348 10/11/19 0600 10/11/19 0844 10/11/19 1003  BP: (!) 139/80 (!) 159/80  (!) 139/74  Pulse: (!) 107 101  (!) 118  Resp: _0 Temp:  (!) 97.4 F (36.3 C)  98.9 F (37.2 C)  TempSrc:  Axillary  Oral  SpO2: 99% 97% 95%   Weight:      Height:        Intake/Output Summary (Last 24 hours) at 10/11/2019 1242 Last data filed at 10/11/2019 0425 Gross per 24 hour  Intake 266.89 ml  Output --  Net 266.89 ml   Filed Weights   10/10/19 1844  Weight: 73.5 kg     Examination:   Physical Exam  Constitution:  Alert, cooperative, no distress, off BiPAP, on supplemental oxygen, has to stop to catch her breath to complete a sentence --seems to be her baseline  HEENT: Normocephalic, PERRL, otherwise with in Normal limits  Chest:Chest symmetric - Cardio vascular:  S1/S2, RRR, No murmure, No Rubs or Gallops  pulmonary: Diffuse wheezing, rhonchi, positive breath sounds in all lung fields, mild diminished in the lower fields, negative for any crackles Abdomen: Soft, non-tender, non-distended, bowel sounds,no masses, no organomegaly Muscular skeletal: Severe bilateral symmetric generalized weaknesses Limited exam - in bed, able to move all 4 extremities, Neuro: CNII-XII intact. , normal motor and sensation, reflexes intact  Extremities: No pitting edema lower extremities, +2 pulses  Skin: Dry, warm to touch, negative for any Rashes, No open wounds Wounds: per nursing  documentation    ------------------------------------------------------------------------------------------------------------------------------------------    LABs:  CBC Latest Ref Rng & Units 10/10/2019 10/09/2019 09/23/2019  WBC 4.0 - 10.5 K/uL 7.2 7.1 13.8(H)  Hemoglobin 12.0 - 15.0 g/dL 9.8(L) 10.4(L) 9.9(L)  Hematocrit 36 - 46 % 33.9(L) 36.5 32.6(L)  Platelets 150 - 400 K/uL 125(L) 161 167   CMP Latest Ref Rng & Units 10/10/2019 10/09/2019 09/23/2019  Glucose 70 - 99 mg/dL 283(H) 238(H) 298(H)  BUN 8 - 23 mg/dL 17 15 31(H)  Creatinine 0.44 - 1.00 mg/dL 0.70 0.91 0.93  Sodium 135 - 145 mmol/L 137 139 140  Potassium 3.5 - 5.1 mmol/L 4.1 4.6 3.8  Chloride 98 - 111 mmol/L 90(L) 94(L) 98  CO2 22 - 32 mmol/L 39(H) 36(H) 32  Calcium 8.9 - 10.3 mg/dL 8.7(L) 9.1 9.1  Total Protein 6.5 - 8.1 g/dL 6.4(L) 6.6 -  Total Bilirubin 0.3 - 1.2 mg/dL 0.4 0.5 -  Alkaline Phos 38 - 126 U/L 75 92 -  AST 15 - 41 U/L 17 16 -  ALT 0 - 44 U/L 20 20 -       Micro Results Recent Results (from the past 240 hour(s))  SARS Coronavirus 2 by RT PCR (hospital order, performed in [facility] hospital lab) Nasopharyngeal Nasopharyngeal Swab     Status: None   Collection Time: 10/10/19 12:51 PM   Specimen: Nasopharyngeal Swab  Result Value Ref Range Status   SARS Coronavirus 2 NEGATIVE NEGATIVE Final    Comment: (NOTE) SARS-CoV-2 target nucleic acids are NOT DETECTED.  The SARS-CoV-2 RNA is generally detectable in upper and lower respiratory specimens during the acute phase of infection. The lowest concentration of SARS-CoV-2 viral copies this assay can detect is 250 copies / mL. A negative result does not preclude SARS-CoV-2 infection and should not be used as the sole basis for treatment or other patient management decisions.  A negative result may occur with improper specimen collection / handling, submission of specimen other than nasopharyngeal swab, presence of viral mutation(s) within the areas  targeted by this assay, and inadequate number of viral copies (<250 copies / mL). A negative result must be combined with clinical observations, patient history, and epidemiological information.  Fact Sheet for Patients:   StrictlyIdeas.no  Fact Sheet for  Healthcare Providers: BankingDealers.co.za  This test is not yet approved or  cleared by the [place] and has been authorized for detection and/or diagnosis of SARS-CoV-2 by FDA under an Emergency Use Authorization (EUA).  This EUA will remain in effect (meaning this test can be used) for the duration of the COVID-19 declaration under Section 564(b)(1) of the Act, 21 U.S.C. section 360bbb-3(b)(1), unless the authorization is terminated or revoked sooner.  Performed at [facility], [place] [address] [place], [place] [address]   Respiratory Panel by PCR     Status: None   Collection Time: 10/10/19  6:57 PM   Specimen: Nasopharyngeal Swab; Respiratory  Result Value Ref Range Status   Adenovirus NOT DETECTED NOT DETECTED Final   Coronavirus 229E NOT DETECTED NOT DETECTED Final    Comment: (NOTE) The Coronavirus on the Respiratory Panel, DOES NOT test for the novel  Coronavirus (2019 nCoV)    Coronavirus HKU1 NOT DETECTED NOT DETECTED Final   Coronavirus NL63 NOT DETECTED NOT DETECTED Final   Coronavirus OC43 NOT DETECTED NOT DETECTED Final   Metapneumovirus NOT DETECTED NOT DETECTED Final   Rhinovirus / Enterovirus NOT DETECTED NOT DETECTED Final   Influenza A NOT DETECTED NOT DETECTED Final   Influenza B NOT DETECTED NOT DETECTED Final   Parainfluenza Virus 1 NOT DETECTED NOT DETECTED Final   Parainfluenza Virus 2 NOT DETECTED NOT DETECTED Final   Parainfluenza Virus 3 NOT DETECTED NOT DETECTED Final   Parainfluenza Virus 4 NOT DETECTED NOT DETECTED Final   Respiratory Syncytial Virus NOT DETECTED NOT DETECTED Final   Bordetella pertussis NOT DETECTED  NOT DETECTED Final   Chlamydophila pneumoniae NOT DETECTED NOT DETECTED Final   Mycoplasma pneumoniae NOT DETECTED NOT DETECTED Final    Comment: Performed at [facility], [place]. [address], [place], [place] [address]    Radiology Reports DG Chest 2 View  Result Date: 10/09/2019 CLINICAL DATA:  Worsening shortness of breath today. History of COPD. EXAM: CHEST - 2 VIEW COMPARISON:  09/20/2019 FINDINGS: Heart size is normal. Aortic atherosclerosis as seen previously. Chronic interstitial lung markings at least partly due to chronic lung disease. There may be an element of superimposed interstitial edema. No advanced edema. No effusions. No acute bone finding. Old augmented lower thoracic fracture. IMPRESSION: Chronic lung disease. Some interstitial markings related to that. There could be an element of superimposed acute interstitial edema as well. Electronically Signed   By: Nelson Chimes M.D.   On: 10/09/2019 20:42   CT Head Wo Contrast  Result Date: 09/20/2019 CLINICAL DATA:  Encephalopathy.  Respiratory distress. EXAM: CT HEAD WITHOUT CONTRAST TECHNIQUE: Contiguous axial images were obtained from the base of the skull through the vertex without intravenous contrast. COMPARISON:  04/26/2019 FINDINGS: Brain: Mild age related volume loss. No old or acute focal infarction, mass lesion, hemorrhage, hydrocephalus or extra-axial collection. Vascular: There is atherosclerotic calcification of the major vessels at the base of the brain. Skull: Negative Sinuses/Orbits: Clear/normal Other: None IMPRESSION: No acute or significant head CT finding. Mild age related volume loss. Electronically Signed   By: Nelson Chimes M.D.   On: 09/20/2019 10:48   CT ABDOMEN PELVIS W CONTRAST  Result Date: 09/20/2019 CLINICAL DATA:  Abdominal pain. EXAM: CT ABDOMEN AND PELVIS WITH CONTRAST TECHNIQUE: Multidetector CT imaging of the abdomen and pelvis was performed using the standard protocol following bolus administration of  intravenous contrast. CONTRAST:  133m OMNIPAQUE IOHEXOL 300 MG/ML  SOLN COMPARISON:  06/08/2019 FINDINGS: Lower  chest: Redemonstration of emphysema but without focal infiltrate, collapse or effusion. Pericardial effusion in left pleural effusion previously seen have resolved. Skin thickening of the left breast again. Hepatobiliary: Previous cholecystectomy. Liver parenchyma appears normal. Pancreas: Normal Spleen: Normal Adrenals/Urinary Tract: Adrenal glands are normal. Kidneys are normal. Bladder is normal. Stomach/Bowel: Stomach and small intestine appear unremarkable. Moderate amount of fecal matter within the colon but without evidence inflammation or frank obstruction. Vascular/Lymphatic: Aortic atherosclerosis. No aneurysm. IVC is normal. No retroperitoneal adenopathy. Reproductive: Previous hysterectomy.  No pelvic mass. Other: No free air or fluid. Musculoskeletal: Ordinary lumbar degenerative changes. Previous vertebral augmentation L1. IMPRESSION: No acute abdominal finding. Large amount stool and gas in the colon which could conceivably be symptomatic. Aortic Atherosclerosis (ICD10-I70.0) and Emphysema (ICD10-J43.9). Skin thickening of the left breast as seen previously. Resolution of previously seen pericardial effusion left pleural effusion. Electronically Signed   By: Nelson Chimes M.D.   On: 09/20/2019 10:51   DG Chest Port 1 View  Result Date: 10/10/2019 CLINICAL DATA:  Respiratory distress, shortness of breath EXAM: PORTABLE CHEST 1 VIEW COMPARISON:  Portable exam 1242 hours compared to 10/09/2019 FINDINGS: Normal heart size, mediastinal contours, and pulmonary vascularity. Coronary stent noted. Atherosclerotic calcification aorta. Emphysematous changes with prominent interstitial markings at both lung bases unchanged since previous exam as well as an earlier study of 06/08/2019. No definite acute infiltrate, pleural effusion or pneumothorax. Bones demineralized. IMPRESSION: Changes of COPD and  stable appearing chronic interstitial lung disease. No acute abnormalities. Electronically Signed   By: Lavonia Dana M.D.   On: 10/10/2019 13:05   DG Chest Portable 1 View  Result Date: 09/20/2019 CLINICAL DATA:  Shortness of breath. EXAM: PORTABLE CHEST 1 VIEW COMPARISON:  09/04/2019.  06/16/2019.  06/08/2019.  04/26/2019. FINDINGS: Mediastinum and hilar structures normal. Heart size normal. Chronic bilateral interstitial prominence. Findings most consistent chronic interstitial lung disease. Superimposed active pneumonitis cannot be excluded. No pleural effusion or pneumothorax. No acute bony abnormality. IMPRESSION: Chronic bilateral interstitial prominence. Findings most consistent chronic interstitial lung disease. Superimposed pneumonitis cannot be excluded. Electronically Signed   By: Marcello Moores  Register   On: 09/20/2019 07:54   DG Swallowing Func-Speech Pathology  Result Date: 09/23/2019 Objective Swallowing Evaluation: Type of Study: MBS-Modified Barium Swallow Study  Patient Details Name: SRAH AKE MRN: [id number] Date of Birth: Jun 14, 1950 Today's Date: 09/23/2019 Time: SLP Start Time (ACUTE ONLY): 0948 -SLP Stop Time (ACUTE ONLY): 1000 SLP Time Calculation (min) (ACUTE ONLY): 12 min Past Medical History: Past Medical History: Diagnosis Date . Abnormal chest xray  . ABUSE, ALCOHOL, UNSPECIFIED 09/04/2006  Qualifier: Diagnosis of  By: Amil Amen MD, Benjamine Mola   . Acute diastolic CHF (congestive heart failure) ([place])  . Acute on chronic respiratory failure with hypercapnia ([place])  . Acute respiratory failure with hypoxia ([place]) 01/07/2015 . Alcohol abuse  . Angina, class III ([place]) 03/29/2007  Qualifier: Diagnosis of  By: Amil Amen MD, Benjamine Mola   . ARF (acute renal failure) ([place]) 04/27/2019 . Back pain  . CAD (coronary artery disease)    Prev [facility] by [facility].  Stent to OM and LAD Last cath 6/11 cathed on June 13, [name]. 2011.  The cardiac catheterization showed 30% and 50% lesions in the mid  and distal LAD, 50% in  the OM1, 40% in the OM2 with 20% in-stent  restenosis, 40% in-stent restenosis in the circumflex and no significant  disease in the RCA.  Her EF was normal.  Dr. Olevia Perches recommended a trial  of proton pump inhibitors  .  CAD S/P percutaneous coronary angioplasty: Prior Cypher DES to pLAD, pOM2 & AVG Cx (after Om2); New DES to AVG Cx ISR & Angiosculpt PTCA of Ostial AVG Cx from OM2. 09/04/2006  Qualifier: Diagnosis of  By: Amil Amen MD, Benjamine Mola   . Chest pain  . COPD (chronic obstructive pulmonary disease) ([place])  . COPD GOLD II / still smoking  09/04/2006  Followed in Pulmonary clinic/ [facility]/ [place]  Active smoker  - pft's 12/12/12 FEV1  1.41 (53%) ratio 54 with no significant reversibility and DLCO 47% corrects to 50 01/16/2013  Walked RA x 2 laps @ 185 ft each stopped due to sob, no desat     - Trial of anoro 01/16/2013 > helped but insurance would not pay - incruse one daily started 10/02/2013 >>>not covered  -Spiriva restarted 10/15/13 > . Cyanosis  . DDD (degenerative disc disease)  . DEGENERATIVE DISC DISEASE, CERVICAL SPINE 09/04/2006  Annotation: c6-7 on the right Qualifier: Diagnosis of  By: Amil Amen MD, Benjamine Mola   . Depression  . Diarrhea  . Diverticular disease  . DIVERTICULOSIS, COLON 09/04/2006  Qualifier: Diagnosis of  By: Amil Amen MD, Benjamine Mola   . GERD (gastroesophageal reflux disease)  . H/O: hysterectomy  . Hemorrhoids  . HTN (hypertension)  . IBS (irritable bowel syndrome)  . Insomnia  . Irritable bowel syndrome 09/04/2006  Qualifier: Diagnosis of  By: Amil Amen MD, Benjamine Mola   . Lymphadenitis  . PEPTIC ULCER DISEASE 09/04/2006  Annotation: distant history of  Qualifier: Diagnosis of  By: Amil Amen MD, Benjamine Mola   . Pharyngitis  . PHARYNGITIS 07/18/2007  Qualifier: Diagnosis of  By: Radene Ou MD, [place]   . PUD (peptic ulcer disease)  . PVD (peripheral vascular disease) ([place])  . Sinusitis  . TIA (transient ischemic attack)  . Tobacco abuse  . Vertigo  Past Surgical History: Past Surgical History:  Procedure Laterality Date . ABDOMINAL HYSTERECTOMY   . APPENDECTOMY   . BREAST LUMPECTOMY Left 2015 . CARDIAC CATHETERIZATION  2005, 2006, 2009,2011 . CHOLECYSTECTOMY   . CORONARY STENT INTERVENTION N/A 04/22/2019  Procedure: CORONARY STENT INTERVENTION;  Surgeon: Troy Sine, MD;  Location: [place] [facility];  Service: Cardiovascular;  Laterality: N/A; . CORONARY STENT PLACEMENT    Status post balloon angiogram plasty and Cypher stent to the distal    circumflex and OM2 branch . ESOPHAGOGASTRODUODENOSCOPY (EGD) WITH PROPOFOL Left 04/21/2019  Procedure: ESOPHAGOGASTRODUODENOSCOPY (EGD) WITH PROPOFOL;  Surgeon: Arta Silence, MD;  Location: [place];  Service: Endoscopy;  Laterality: Left; . Hammertoe repair   . Left Bunionectomy   . LEFT HEART CATH AND CORONARY ANGIOGRAPHY N/A 04/22/2019  Procedure: LEFT HEART CATH AND CORONARY ANGIOGRAPHY;  Surgeon: Troy Sine, MD;  Location: [place] [facility];  Service: Cardiovascular;  Laterality: N/A; . LEFT HEART CATHETERIZATION WITH CORONARY ANGIOGRAM N/A 12/03/2013  Procedure: LEFT HEART CATHETERIZATION WITH CORONARY ANGIOGRAM;  Surgeon: Leonie Man, MD;  Location: [facility] C[facility] LAB;  Service: Cardiovascular;  Laterality: N/A; . TUBAL LIGATION   HPI: 69 year old lady with prior history of COPD, chronic respiratory failure with hypoxia on 4 L of nasal cannula oxygen at baseline, hypertension, diastolic heart failure, coronary artery disease, breast cancer s/p lumpectomy and radiation treatment, type 2 diabetes mellitus presents with shortness of breath for the last 2 days without any relief.  On arrival to ED she was found to be hypoxic requiring BiPAP and ABG shows pH of 7.2, PCO2 of 94 and PO2 of 105.  Patient was admitted for acute respiratory failure secondary to  acute COPD exacerbation  No data recorded Assessment / Plan / Recommendation CHL IP CLINICAL IMPRESSIONS 09/23/2019 Clinical Impression Oropharyngeal phases of swallow were normal without baseline cough  prior to MBS. Timing and strength prevented abnormal penetration and aspiration. Flash penetration with thin which is common and not dysfunctional. No difficulty orally transiting pill with timely transit to stomach. Educated pt and daughter to take rest breaks during meals, defer eating/drinking if experiencing coughing episodes. Straws allowed and pills with thin. Upgrade diet texture to regular and liquids to thin. No follow up needed.   SLP Visit Diagnosis Dysphagia, unspecified (R13.10) Attention and concentration deficit following -- Frontal lobe and executive function deficit following -- Impact on safety and function Mild aspiration risk   CHL IP TREATMENT RECOMMENDATION 09/23/2019 Treatment Recommendations No treatment recommended at this time   Prognosis 04/19/2019 Prognosis for Safe Diet Advancement Good Barriers to Reach Goals -- Barriers/Prognosis Comment -- CHL IP DIET RECOMMENDATION 09/23/2019 SLP Diet Recommendations Regular solids;Thin liquid Liquid Administration via Cup;Straw Medication Administration Whole meds with liquid Compensations Slow rate;Small sips/bites;Other (Comment) Postural Changes Seated upright at 90 degrees   CHL IP OTHER RECOMMENDATIONS 09/23/2019 Recommended Consults -- Oral Care Recommendations Oral care BID Other Recommendations --   CHL IP FOLLOW UP RECOMMENDATIONS 09/23/2019 Follow up Recommendations None   CHL IP FREQUENCY AND DURATION 04/19/2019 Speech Therapy Frequency (ACUTE ONLY) min 2x/week Treatment Duration 2 weeks      CHL IP ORAL PHASE 09/23/2019 Oral Phase WFL Oral - Pudding Teaspoon -- Oral - Pudding Cup -- Oral - Honey Teaspoon -- Oral - Honey Cup -- Oral - Nectar Teaspoon -- Oral - Nectar Cup -- Oral - Nectar Straw -- Oral - Thin Teaspoon -- Oral - Thin Cup -- Oral - Thin Straw -- Oral - Puree -- Oral - Mech Soft -- Oral - Regular -- Oral - Multi-Consistency -- Oral - Pill -- Oral Phase - Comment --  CHL IP PHARYNGEAL PHASE 09/23/2019 Pharyngeal Phase WFL Pharyngeal-  Pudding Teaspoon -- Pharyngeal -- Pharyngeal- Pudding Cup -- Pharyngeal -- Pharyngeal- Honey Teaspoon -- Pharyngeal -- Pharyngeal- Honey Cup -- Pharyngeal -- Pharyngeal- Nectar Teaspoon -- Pharyngeal -- Pharyngeal- Nectar Cup -- Pharyngeal -- Pharyngeal- Nectar Straw -- Pharyngeal -- Pharyngeal- Thin Teaspoon -- Pharyngeal -- Pharyngeal- Thin Cup -- Pharyngeal -- Pharyngeal- Thin Straw -- Pharyngeal -- Pharyngeal- Puree -- Pharyngeal -- Pharyngeal- Mechanical Soft -- Pharyngeal -- Pharyngeal- Regular -- Pharyngeal -- Pharyngeal- Multi-consistency -- Pharyngeal -- Pharyngeal- Pill -- Pharyngeal -- Pharyngeal Comment --  CHL IP CERVICAL ESOPHAGEAL PHASE 09/23/2019 Cervical Esophageal Phase WFL Pudding Teaspoon -- Pudding Cup -- Honey Teaspoon -- Honey Cup -- Nectar Teaspoon -- Nectar Cup -- Nectar Straw -- Thin Teaspoon -- Thin Cup -- Thin Straw -- Puree -- Mechanical Soft -- Regular -- Multi-consistency -- Pill -- Cervical Esophageal Comment -- [place] Siren 09/23/2019, 10:34 AM Orbie Pyo Colvin Caroli.Ed Actor Pager [phone] Office [phone]              ECHOCARDIOGRAM COMPLETE  Result Date: 09/22/2019    ECHOCARDIOGRAM REPORT   Patient Name:   CATERINA RACINE Date of Exam: 09/22/2019 Medical Rec #:  [id number]       Height:       66.0 in Accession #:    [id number]      Weight:       151.0 lb Date of Birth:  02-28-1951       BSA:  1.775 m Patient Age:    38 years        BP:           146/71 mmHg Patient Gender: F               HR:           108 bpm. Exam Location:  Inpatient Procedure: 2D Echo, Cardiac Doppler and Color Doppler Indications:    R55 Syncope  History:        Patient has prior history of Echocardiogram examinations, most                 recent 04/19/2019.  Sonographer:    Merrie Roof RDCS Referring Phys: [place]  1. Left ventricular ejection fraction, by estimation, is 60 to 65%. The left ventricle has normal function. The left ventricle has no  regional wall motion abnormalities. Left ventricular diastolic parameters were normal.  2. Right ventricular systolic function is low normal. The right ventricular size is mildly enlarged. There is mildly elevated pulmonary artery systolic pressure. The estimated right ventricular systolic pressure is 61.4 mmHg.  3. Left atrial size was upper normal.  4. The mitral valve is grossly normal, mild annular calcification. Trivial mitral valve regurgitation.  5. The aortic valve is tricuspid. Aortic valve regurgitation is not visualized. Mild aortic valve sclerosis is present, with no evidence of aortic valve stenosis.  6. The inferior vena cava is normal in size with greater than 50% respiratory variability, suggesting right atrial pressure of 3 mmHg. FINDINGS  Left Ventricle: Left ventricular ejection fraction, by estimation, is 60 to 65%. The left ventricle has normal function. The left ventricle has no regional wall motion abnormalities. The left ventricular internal cavity size was normal in size. There is  no left ventricular hypertrophy. Left ventricular diastolic parameters were normal. Right Ventricle: The right ventricular size is mildly enlarged. No increase in right ventricular wall thickness. Right ventricular systolic function is low normal. There is mildly elevated pulmonary artery systolic pressure. The tricuspid regurgitant velocity is 2.99 m/s, and with an assumed right atrial pressure of 3 mmHg, the estimated right ventricular systolic pressure is 70.9 mmHg. Left Atrium: Left atrial size was upper normal. Right Atrium: Right atrial size was normal in size. Pericardium: There is no evidence of pericardial effusion. Mitral Valve: The mitral valve is grossly normal. Mild mitral annular calcification. Trivial mitral valve regurgitation. Tricuspid Valve: The tricuspid valve is grossly normal. Tricuspid valve regurgitation is mild. Aortic Valve: The aortic valve is tricuspid. Aortic valve regurgitation is not  visualized. Mild aortic valve sclerosis is present, with no evidence of aortic valve stenosis. Mild aortic valve annular calcification. Pulmonic Valve: The pulmonic valve was not well visualized. Pulmonic valve regurgitation is not visualized. Aorta: The aortic root is normal in size and structure. Venous: The inferior vena cava is normal in size with greater than 50% respiratory variability, suggesting right atrial pressure of 3 mmHg. IAS/Shunts: No atrial level shunt detected by color flow Doppler.  LEFT VENTRICLE PLAX 2D LVIDd:         4.10 cm     Diastology LVIDs:         2.80 cm     LV e' lateral:   8.70 cm/s LV PW:         0.90 cm     LV E/e' lateral: 11.0 LV IVS:        0.80 cm     LV e' medial:  9.46 cm/s LVOT diam:     2.00 cm     LV E/e' medial:  10.1 LV SV:         67 LV SV Index:   38 LVOT Area:     3.14 cm  LV Volumes (MOD) LV vol d, MOD A2C: 66.8 ml LV vol d, MOD A4C: 83.3 ml LV vol s, MOD A2C: 30.2 ml LV vol s, MOD A4C: 33.7 ml LV SV MOD A2C:     36.6 ml LV SV MOD A4C:     83.3 ml LV SV MOD BP:      45.1 ml RIGHT VENTRICLE             IVC RV Basal diam:  3.60 cm     IVC diam: 1.80 cm RV S prime:     11.20 cm/s TAPSE (M-mode): 1.8 cm LEFT ATRIUM             Index       RIGHT ATRIUM           Index LA diam:        3.90 cm 2.20 cm/m  RA Area:     18.50 cm LA Vol (A2C):   55.9 ml 31.50 ml/m RA Volume:   58.60 ml  33.02 ml/m LA Vol (A4C):   49.4 ml 27.84 ml/m LA Biplane Vol: 54.5 ml 30.71 ml/m  AORTIC VALVE LVOT Vmax:   106.00 cm/s LVOT Vmean:  75.200 cm/s LVOT VTI:    0.212 m  AORTA Ao Root diam: 3.00 cm MITRAL VALVE                TRICUSPID VALVE MV Area (PHT): 6.37 cm     TR Peak grad:   35.8 mmHg MV Decel Time: 119 msec     TR Vmax:        299.00 cm/s MV E velocity: 95.50 cm/s MV A velocity: 121.00 cm/s  SHUNTS MV E/A ratio:  0.79         Systemic VTI:  0.21 m                             Systemic Diam: 2.00 cm Rozann Lesches MD Electronically signed by Rozann Lesches MD Signature Date/Time:  09/22/2019/4:53:11 PM    Final     SIGNED: Deatra James, MD, FACP, FHM. [facility],  Pager (please use [email or web address] to page/text)  If 7PM-7AM, please contact night-coverage [email or web address] [email or web address] [facility] 10/11/2019, 12:42 PM

## 2019-10-11 NOTE — Progress Notes (Deleted)
   10/10/19 2200  Vitals  Temp 98.9 F (37.2 C)  BP (!) 148/83  MAP (mmHg) 102  Pulse Rate (!) 110  ECG Heart Rate (!) 111  Resp 19  Level of Consciousness  Level of Consciousness Alert  MEWS COLOR  MEWS Score Color Yellow  Oxygen Therapy  SpO2 95 %  O2 Device Nasal Cannula  O2 Flow Rate (L/min) 4 L/min  Pain Assessment  Pain Scale 0-10  Pain Score 0  MEWS Score  MEWS Temp 0  MEWS Systolic 0  MEWS Pulse 2  MEWS RR 0  MEWS LOC 0  MEWS Score 2

## 2019-10-11 NOTE — Evaluation (Signed)
Occupational Therapy Evaluation Patient Details Name: Patricia Wall MRN: [id number] DOB: 1950/12/05 Today's Date: 10/11/2019    History of Present Illness 69 y.o. female with a known history of hypertension, diastolic heart failure, coronary artery disease, breast cancer status post lumpectomy and radiation treatment, type 2 diabetes, chronic respiratory failure with hypoxia requiring 4 L oxygen via nasal cannula with COPD is being admitted for acute on chronic hypoxic respiratory failure due to COPD exacerbation.   Clinical Impression   Pt admitted with the above. Pt currently with functional limitations due to the deficits listed below (see OT Problem List).  Pt will benefit from skilled OT to increase their safety and independence with ADL and functional mobility for ADL to facilitate discharge to venue listed below.      Follow Up Recommendations  Supervision/Assistance - 24 hour    Equipment Recommendations  None recommended by OT    Recommendations for Other Services       Precautions / Restrictions Precautions Precautions: Fall Precaution Comments: 4L O2 [place] baseline      Mobility Bed Mobility Overal bed mobility: Needs Assistance Bed Mobility: Supine to Sit;Sit to Supine     Supine to sit: HOB elevated;Min assist Sit to supine: Min assist      Transfers Overall transfer level: Needs assistance Equipment used: None Transfers: Sit to/from Stand Sit to Stand: Min assist Stand pivot transfers: Min guard       General transfer comment: min/guard for safety, pt used UEs for support with bed rail and armrests of recliner, assisted with lines management, pt remained on 4L O2 [place] and SpO2 in mid90s    Balance Overall balance assessment: Needs assistance         Standing balance support: Bilateral upper extremity supported Standing balance-Leahy Scale: Poor Standing balance comment: reliant on UE support                           ADL either  performed or assessed with clinical judgement   ADL Overall ADL's : Needs assistance/impaired Eating/Feeding: Set up;Sitting   Grooming: Wash/dry face;Sitting   Upper Body Bathing: Set up;Sitting   Lower Body Bathing: Moderate assistance;Sit to/from stand;Cueing for safety;Cueing for sequencing   Upper Body Dressing : Set up;Sitting   Lower Body Dressing: Moderate assistance;Sit to/from stand;Cueing for safety;Cueing for sequencing       Toileting- Clothing Manipulation and Hygiene: Moderate assistance;Sit to/from stand;Cueing for compensatory techniques               Vision Patient Visual Report: No change from baseline              Pertinent Vitals/Pain Pain Assessment: No/denies pain     Hand Dominance     Extremity/Trunk Assessment Upper Extremity Assessment Upper Extremity Assessment: Generalized weakness   Lower Extremity Assessment Lower Extremity Assessment: Generalized weakness       Communication Communication Communication: No difficulties   Cognition Arousal/Alertness: Awake/alert Behavior During Therapy: WFL for tasks assessed/performed Overall Cognitive Status: Within Functional Limits for tasks assessed                                                Home Living Family/patient expects to be discharged to:: Private residence Living Arrangements: Children Available Help at Discharge: Family;Available 24 hours/day Type of Home: House Home Access:  Stairs to enter [employer] of Steps: 2 Entrance Stairs-Rails: Right;Left Home Layout: One level     Bathroom Shower/Tub: Tub/shower unit         Home Equipment: Environmental consultant - 4 wheels;Bedside commode;Wheelchair - Chiropodist          Prior Functioning/Environment Level of Independence: Needs assistance  Gait / Transfers Assistance Needed: pt requires intermittent assistance for transfers, community mobility in transport chair, and for stair  negotiation ADL's / Homemaking Assistance Needed: Daughter and hospice assists with ADLs            OT Problem List: Decreased strength;Decreased activity tolerance;Impaired balance (sitting and/or standing);Decreased safety awareness;Decreased knowledge of use of DME or AE      OT Treatment/Interventions: Self-care/ADL training;Patient/family education;DME and/or AE instruction    OT Goals(Current goals can be found in the care plan section) Acute Rehab OT Goals Patient Stated Goal: do more tomorrow with you OT Goal Formulation: With patient Time For Goal Achievement: 10/18/19 Potential to Achieve Goals: Good  OT Frequency: Min 2X/week    AM-PAC OT "6 Clicks" Daily Activity     Outcome Measure Help from another person eating meals?: A Little Help from another person taking care of personal grooming?: A Little Help from another person toileting, which includes using toliet, bedpan, or urinal?: A Lot Help from another person bathing (including washing, rinsing, drying)?: A Little Help from another person to put on and taking off regular upper body clothing?: A Lot Help from another person to put on and taking off regular lower body clothing?: A Lot 6 Click Score: 15   End of Session Equipment Utilized During Treatment: Rolling walker Nurse Communication: Mobility status  Activity Tolerance: Patient limited by fatigue Patient left: in bed;with call bell/phone within reach;with bed alarm set  OT Visit Diagnosis: Unsteadiness on feet (R26.81);Other abnormalities of gait and mobility (R26.89);Repeated falls (R29.6);Muscle weakness (generalized) (M62.81)                  Charges:  OT General Charges $OT Visit: 1 Visit OT Evaluation $OT Eval Moderate Complexity: 1 Mod  Lise Auer, OT Acute Rehabilitation Services [phone] Office- [phone]     Marolyn Urschel, Karin Golden D 10/11/2019, 3:47 PM

## 2019-10-11 NOTE — Evaluation (Signed)
Physical Therapy Evaluation Patient Details Name: Patricia Wall MRN: [id number] DOB: 13-Feb-1951 Today's Date: 10/11/2019   History of Present Illness  69 y.o. female with a known history of hypertension, diastolic heart failure, coronary artery disease, breast cancer status post lumpectomy and radiation treatment, type 2 diabetes, chronic respiratory failure with hypoxia requiring 4 L oxygen via nasal cannula with COPD is being admitted for acute on chronic hypoxic respiratory failure due to COPD exacerbation.  Clinical Impression  Pt admitted with above diagnosis.  Pt currently with functional limitations due to the deficits listed below (see PT Problem List). Pt will benefit from skilled PT to increase their independence and safety with mobility to allow discharge to the venue listed below.  Pt reports she mostly remains in bed or chair at home and daughter assists as needed.  Pt plans to return home with hospice upon d/c.      Follow Up Recommendations No PT follow up;Other (comment) (plans to return home with hospice)    Equipment Recommendations  None recommended by PT    Recommendations for Other Services       Precautions / Restrictions Precautions Precautions: Fall Precaution Comments: 4L O2 [place] baseline Restrictions Weight Bearing Restrictions: No      Mobility  Bed Mobility Overal bed mobility: Needs Assistance Bed Mobility: Supine to Sit     Supine to sit: Supervision;HOB elevated        Transfers Overall transfer level: Needs assistance Equipment used: None Transfers: Sit to/from [employer] Sit to Stand: Min guard Stand pivot transfers: Min guard       General transfer comment: min/guard for safety, pt used UEs for support with bed rail and armrests of recliner, assisted with lines management, pt remained on 4L O2  and SpO2 in mid90s  Ambulation/Gait             General Gait Details: pt reports she does not ambulate at  baseline  Stairs            Wheelchair Mobility    Modified Rankin (Stroke Patients Only)       Balance Overall balance assessment: Needs assistance         Standing balance support: Bilateral upper extremity supported Standing balance-Leahy Scale: Poor Standing balance comment: reliant on UE support                             Pertinent Vitals/Pain Pain Assessment: No/denies pain    Home Living Family/patient expects to be discharged to:: Private residence Living Arrangements: Children Available Help at Discharge: Family;Available 24 hours/day Type of Home: House Home Access: Stairs to enter Entrance Stairs-Rails: Doctor, general practice of Steps: 2 Home Layout: One level Home Equipment: Walker - 4 wheels;Bedside commode;Wheelchair - Chiropodist      Prior Function Level of Independence: Needs assistance   Gait / Transfers Assistance Needed: pt requires intermittent assistance for transfers, community mobility in transport chair, and for stair negotiation  ADL's / Homemaking Assistance Needed: Daughter and hospice assists with ADLs        Hand Dominance        Extremity/Trunk Assessment        Lower Extremity Assessment Lower Extremity Assessment: Generalized weakness       Communication   Communication: No difficulties  Cognition Arousal/Alertness: Awake/alert Behavior During Therapy: WFL for tasks assessed/performed Overall Cognitive Status: Within Functional Limits for tasks assessed  General Comments      Exercises     Assessment/Plan    PT Assessment Patient needs continued PT services  PT Problem List Decreased strength;Decreased mobility;Decreased activity tolerance;Decreased balance;Decreased knowledge of use of DME       PT Treatment Interventions DME instruction;Therapeutic exercise;Gait training;Balance training;Functional  mobility training;Therapeutic activities;Patient/family education;Stair training    PT Goals (Current goals can be found in the Care Plan section)  Acute Rehab PT Goals PT Goal Formulation: With patient Time For Goal Achievement: 10/18/19 Potential to Achieve Goals: Good    Frequency Min 2X/week   Barriers to discharge        Co-evaluation               AM-PAC PT "6 Clicks" Mobility  Outcome Measure Help needed turning from your back to your side while in a flat bed without using bedrails?: A Little Help needed moving from lying on your back to sitting on the side of a flat bed without using bedrails?: A Little Help needed moving to and from a bed to a chair (including a wheelchair)?: A Little Help needed standing up from a chair using your arms (e.g., wheelchair or bedside chair)?: A Little Help needed to walk in hospital room?: A Lot Help needed climbing 3-5 steps with a railing? : Total 6 Click Score: 15    End of Session Equipment Utilized During Treatment: Oxygen Activity Tolerance: Patient tolerated treatment well Patient left: with call bell/phone within reach;in chair;with chair alarm set Nurse Communication: Mobility status PT Visit Diagnosis: Muscle weakness (generalized) (M62.81)    Time: 6734-1937 PT Time Calculation (min) (ACUTE ONLY): 12 min   Charges:   PT Evaluation $PT Eval Low Complexity: 1 Low     Kati PT, DPT Acute Rehabilitation Services Pager: [phone] Office: [phone]  Maida Sale E 10/11/2019, 1:09 PM

## 2019-10-11 NOTE — Progress Notes (Signed)
Initial Nutrition Assessment  INTERVENTION:   -Ensure MAX Protein po daily, each supplement provides 150 kcal and 30 grams of protein  NUTRITION DIAGNOSIS:   Increased nutrient needs related to chronic illness (COPD) as evidenced by estimated needs.  GOAL:   Patient will meet greater than or equal to 90% of their needs  MONITOR:   PO intake, Supplement acceptance, Labs, Weight trends, I & O's  REASON FOR ASSESSMENT:   Consult COPD Protocol  ASSESSMENT:   69 y.o. female with a known history of hypertension, diastolic heart failure, coronary artery disease, breast cancer status post lumpectomy and radiation treatment, type 2 diabetes, chronic respiratory failure with hypoxia requiring 4 L oxygen via nasal cannula with COPD is being admitted for acute on chronic hypoxic respiratory failure due to COPD exacerbation  Per chart review, pt with advanced COPD. Pt is on hospice.  Suspect pt would benefit from nutritional supplements given continued SOB. Will order Ensure Max daily given elevated blood sugars.  Per weight records, pt's weight has increased.   Medications: OSCAL, Vitamin D, Lasix, Multivitamin with minerals daily KLOR-CON Labs reviewed:  CBGs: 251-402  NUTRITION - FOCUSED PHYSICAL EXAM:  Deferred  Diet Order:   Diet Order            Diet regular Room service appropriate? Yes; Fluid consistency: Thin  Diet effective now                 EDUCATION NEEDS:   No education needs have been identified at this time  Skin:  Skin Assessment: Reviewed RN Assessment  Last BM:  7/22  Height:   Ht Readings from Last 1 Encounters:  10/10/19 5\' 6"  (1.676 m)    Weight:   Wt Readings from Last 1 Encounters:  10/10/19 73.5 kg    BMI:  Body mass index is 26.15 kg/m.  Estimated Nutritional Needs:   Kcal:  2000-2200  Protein:  95-105g  Fluid:  2L/day  10/12/19, MS, RD, LDN Inpatient Clinical Dietitian Contact information available via Amion

## 2019-10-11 NOTE — Progress Notes (Signed)
Inpatient Diabetes Program Recommendations  AACE/ADA: New Consensus Statement on Inpatient Glycemic Control (2015)  Target Ranges:  Prepandial:   less than 140 mg/dL      Peak postprandial:   less than 180 mg/dL (1-2 hours)      Critically ill patients:  140 - 180 mg/dL   Lab Results  Component Value Date   GLUCAP 402 (H) 10/11/2019   HGBA1C 7.4 (H) 09/20/2019    Review of Glycemic Control  Diabetes history: DM2 Outpatient Diabetes medications: metformin 1000 mg bid Current orders for Inpatient glycemic control: Novolog 0-15 units Q6H, metformin 1000 mg bid  On Solumedrol 40 mg Q8H HgbA1C - 7.4% CBGs 247, 270 this am.   Inpatient Diabetes Program Recommendations:     Add Levemir 5 units bid  Will continue to follow.  Thank you. Ailene Ards, RD, LDN, CDE Inpatient Diabetes Coordinator [phone]

## 2019-10-12 DIAGNOSIS — J9601 Acute respiratory failure with hypoxia: Secondary | ICD-10-CM

## 2019-10-12 LAB — CBC
HCT: 30.1 % — ABNORMAL LOW (ref 36.0–46.0)
Hemoglobin: 9.1 g/dL — ABNORMAL LOW (ref 12.0–15.0)
MCH: 28.3 pg (ref 26.0–34.0)
MCHC: 30.2 g/dL (ref 30.0–36.0)
MCV: 93.8 fL (ref 80.0–100.0)
Platelets: 172 10*3/uL (ref 150–400)
RBC: 3.21 MIL/uL — ABNORMAL LOW (ref 3.87–5.11)
RDW: 15.7 % — ABNORMAL HIGH (ref 11.5–15.5)
WBC: 17.7 10*3/uL — ABNORMAL HIGH (ref 4.0–10.5)
nRBC: 0 % (ref 0.0–0.2)

## 2019-10-12 LAB — BLOOD GAS, ARTERIAL
Acid-Base Excess: 8.5 mmol/L — ABNORMAL HIGH (ref 0.0–2.0)
Bicarbonate: 36.1 mmol/L — ABNORMAL HIGH (ref 20.0–28.0)
O2 Saturation: 96.3 %
Patient temperature: 98.6
pCO2 arterial: 71.7 mmHg (ref 32.0–48.0)
pH, Arterial: 7.322 — ABNORMAL LOW (ref 7.350–7.450)
pO2, Arterial: 85.9 mmHg (ref 83.0–108.0)

## 2019-10-12 LAB — BASIC METABOLIC PANEL
Anion gap: 6 (ref 5–15)
BUN: 34 mg/dL — ABNORMAL HIGH (ref 8–23)
CO2: 38 mmol/L — ABNORMAL HIGH (ref 22–32)
Calcium: 9 mg/dL (ref 8.9–10.3)
Chloride: 93 mmol/L — ABNORMAL LOW (ref 98–111)
Creatinine, Ser: 0.88 mg/dL (ref 0.44–1.00)
GFR calc Af Amer: >60 mL/min (ref >60)
GFR calc non Af Amer: >60 mL/min (ref >60)
Glucose, Bld: 194 mg/dL — ABNORMAL HIGH (ref 70–99)
Potassium: 4.5 mmol/L (ref 3.5–5.1)
Sodium: 137 mmol/L (ref 135–145)

## 2019-10-12 LAB — GLUCOSE, CAPILLARY
Glucose-Capillary: 144 mg/dL — ABNORMAL HIGH (ref 70–99)
Glucose-Capillary: 206 mg/dL — ABNORMAL HIGH (ref 70–99)
Glucose-Capillary: 238 mg/dL — ABNORMAL HIGH (ref 70–99)
Glucose-Capillary: 300 mg/dL — ABNORMAL HIGH (ref 70–99)

## 2019-10-12 MED ORDER — METHYLPREDNISOLONE SODIUM SUCC 125 MG IJ SOLR
40.0000 mg | Freq: Three times a day (TID) | INTRAMUSCULAR | Status: DC
  Administered 2019-10-12 – 2019-10-15 (×10): 40 mg via INTRAVENOUS
  Filled 2019-10-12 (×10): qty 2

## 2019-10-12 NOTE — Progress Notes (Signed)
PROGRESS NOTE    Patricia Wall  HWT:[id number] DOB: 21-Sep-1950 DOA: 10/10/2019 PCP: Bernerd Limbo, MD    Brief Narrative: JoanBuchananis a69 y.o.femalewith a known history of hypertension, diastolic heart failure, coronary artery disease, breast cancer status post lumpectomy and radiation treatment, type 2 diabetes, chronic respiratory failure with hypoxia requiring 4 L oxygen via nasal cannula with COPD is being admitted for acute on chronic hypoxic respiratory failure due to COPD exacerbation. Patient was recently admitted from July 2 till July 5 of 2021.She has had 4 readmissions in last 63-month   Her daughter reports that patient has had this issue for a long time and typically requires 2 to 3 days of hospitalization,BiPAP and she normally responds well to that.  She was discharged with Trilogy on last admission and has been monitored by her pulmonologist as an outpatient. Her daughter request we put her on BiPAP immediately and give her some anxiety medicine.   Assessment & Plan:   Principal Problem:   Acute respiratory failure with hypoxia (HCC) Active Problems:   Essential hypertension   GERD   COPD exacerbation (HCC)   Acute on chronic respiratory failure with hypoxia and hypercapnia (HCC)   SIRS (systemic inflammatory response syndrome) (HCC)   Acute on chronic hypoxic and hypercapnic respiratory failure -Due to COPD exacerbation History of COPD seems to be close to end-stage COPD, high flow oxygen demand, multiple hospitalization -Multiple admission, for admission in the past 6 months -ABG on admission 7.27/84.5/74.8 ABG this morning 7.369/62.6/72.0 -Patient was placed on BiPAP, has been weaned off currently on 4 L of oxygen, satting greater 92% -We will try to maintain O2 sat 88-92% adjust O2 demand accordingly -Started on IV Solu-Medrol, will initiate tapering down steroids -Continue empiric antibiotics -  -RT consulted continue DuoNeb bronchodilator  treatments, mucolytic's, -SARS-CoV-2 negative x2 -We will try to obtain sputum for culture sensitivity -Respiratory viral panel >>> all negative. - Consider Pulmonology consult.   SIRS/ruling out sepsis -Patient met early SIRS criteria with T-max 100.4, tachypneic, tachycardic, with respiratory failure  But mostly above findings are more consistent with chronic respiratory failure -Nevertheless viral panel pending, blood cultures been obtained. -Empiric antibiotics broad-spectrum antibiotics of cefepime initiated --will be continued for now -10/11/2019 patient has much improved, afebrile, normotensive mild tachycardic likely due to treat breathing treatments-chronically tachypneic due to chronic respiratory status Lactic acid would not be reliable due to chronic respiratory failure, hypoxia  Paroxysmal A. Fib -Stable, rate controlled - continue Eliquis  Essential hypertension -Remained stable, continue home meds  Hyperlipidemia  -Continue statins  chronic diastolic CHF Well compensated at this time  Type 2 diabetes Sliding scale insulin for now,consult diabetic nurse coordinator  Dr. SRoger Shelter discussed her hospice situation with her daughter. Daughter feels very strongly that she would like all care possible while here she is planning to call her hospice agency to notify about her hospitalization and receiving full care while here.  She is agreeable with DNR.   CODE STATUS:DNR  DVT prophylaxis: Eliquis Code Status: DNR Family Communication:No family member present at bedside  Disposition Plan:  Dispo: The patient is from: Home  Anticipated d/c is to: Home  Anticipated d/c date is: 3 days  Patient currently is not medically stable to d/c due to COPD exacerbation.    Consultants:   Pulmonology.  Procedures: BIPAP Antimicrobials: Anti-infectives (From admission, onward)   Start     Dose/Rate Route Frequency Ordered  Stop   10/10/19 1630  ceFEPIme (MAXIPIME) 2 g in  sodium chloride 0.9 % 100 mL IVPB     Discontinue     2 g 200 mL/hr over 30 Minutes Intravenous Every 8 hours 10/10/19 1448 10/15/19 1359      Subjective: Patient was seen and examined at bedside.  She appears very lethargic, answers questions appropriately, goes back to sleep.  She has been weaned off of BiPAP to 4 L supplemental oxygen.  No overnight issues.  Objective: Vitals:   10/12/19 0200 10/12/19 0444 10/12/19 0901 10/12/19 1224  BP:      Pulse: 93     Resp: 19     Temp:  99.1 F (37.3 C)  98.9 F (37.2 C)  TempSrc:  Axillary  Oral  SpO2: 96%  96%   Weight:      Height:        Intake/Output Summary (Last 24 hours) at 10/12/2019 1438 Last data filed at 10/11/2019 1709 Gross per 24 hour  Intake 316.29 ml  Output --  Net 316.29 ml   Filed Weights   10/10/19 1844  Weight: 73.5 kg    Examination:  General exam: Appears calm and comfortable  Respiratory system: Clear to auscultation. Respiratory effort normal. Cardiovascular system: S1 & S2 heard, RRR. No JVD, murmurs, rubs, gallops or clicks. No pedal edema. Gastrointestinal system: Abdomen is nondistended, soft and nontender. No organomegaly or masses felt. Normal bowel sounds heard. Central nervous system: Alert and oriented. No focal neurological deficits. Extremities:  No edema, no leg swelling. Skin: No rashes, lesions or ulcers Psychiatry: Judgement and insight appear normal. Mood & affect appropriate.     Data Reviewed: I have personally reviewed following labs and imaging studies  CBC: Recent Labs  Lab 10/09/19 1959 10/10/19 1251 10/12/19 0459  WBC 7.1 7.2 17.7*  NEUTROABS 5.1 5.8  --   HGB 10.4* 9.8* 9.1*  HCT 36.5 33.9* 30.1*  MCV 97.1 96.9 93.8  PLT 161 125* 275   Basic Metabolic Panel: Recent Labs  Lab 10/09/19 1959 10/10/19 1251 10/12/19 0459  NA 139 137 137  K 4.6 4.1 4.5  CL 94* 90* 93*  CO2 36* 39* 38*  GLUCOSE 238* 283* 194*   BUN 15 17 34*  CREATININE 0.91 0.70 0.88  CALCIUM 9.1 8.7* 9.0   GFR: Estimated Creatinine Clearance: 60.2 mL/min (by C-G formula based on SCr of 0.88 mg/dL). Liver Function Tests: Recent Labs  Lab 10/09/19 1959 10/10/19 1251  AST 16 17  ALT 20 20  ALKPHOS 92 75  BILITOT 0.5 0.4  PROT 6.6 6.4*  ALBUMIN 3.2* 3.3*   No results for input(s): LIPASE, AMYLASE in the last 168 hours. No results for input(s): AMMONIA in the last 168 hours. Coagulation Profile: No results for input(s): INR, PROTIME in the last 168 hours. Cardiac Enzymes: No results for input(s): CKTOTAL, CKMB, CKMBINDEX, TROPONINI in the last 168 hours. BNP (last 3 results) Recent Labs    04/15/19 1225  PROBNP 711*   HbA1C: No results for input(s): HGBA1C in the last 72 hours. CBG: Recent Labs  Lab 10/11/19 1243 10/11/19 1630 10/11/19 2151 10/12/19 0743 10/12/19 1154  GLUCAP 349* 206* 192* 144* 206*   Lipid Profile: No results for input(s): CHOL, HDL, LDLCALC, TRIG, CHOLHDL, LDLDIRECT in the last 72 hours. Thyroid Function Tests: No results for input(s): TSH, T4TOTAL, FREET4, T3FREE, THYROIDAB in the last 72 hours. Anemia Panel: No results for input(s): VITAMINB12, FOLATE, FERRITIN, TIBC, IRON, RETICCTPCT in the last 72 hours. Sepsis Labs: No results for input(s): PROCALCITON, LATICACIDVEN in  the last 168 hours.  Recent Results (from the past 240 hour(s))  SARS Coronavirus 2 by RT PCR (hospital order, performed in [facility] hospital lab) Nasopharyngeal Nasopharyngeal Swab     Status: None   Collection Time: 10/10/19 12:51 PM   Specimen: Nasopharyngeal Swab  Result Value Ref Range Status   SARS Coronavirus 2 NEGATIVE NEGATIVE Final    Comment: (NOTE) SARS-CoV-2 target nucleic acids are NOT DETECTED.  The SARS-CoV-2 RNA is generally detectable in upper and lower respiratory specimens during the acute phase of infection. The lowest concentration of SARS-CoV-2 viral copies this assay can detect is  250 copies / mL. A negative result does not preclude SARS-CoV-2 infection and should not be used as the sole basis for treatment or other patient management decisions.  A negative result may occur with improper specimen collection / handling, submission of specimen other than nasopharyngeal swab, presence of viral mutation(s) within the areas targeted by this assay, and inadequate number of viral copies (<250 copies / mL). A negative result must be combined with clinical observations, patient history, and epidemiological information.  Fact Sheet for Patients:   StrictlyIdeas.no  Fact Sheet for Healthcare Providers: BankingDealers.co.za  This test is not yet approved or  cleared by the [place] FDA and has been authorized for detection and/or diagnosis of SARS-CoV-2 by FDA under an Emergency Use Authorization (EUA).  This EUA will remain in effect (meaning this test can be used) for the duration of the COVID-19 declaration under Section 564(b)(1) of the Act, 21 U.S.C. section 360bbb-3(b)(1), unless the authorization is terminated or revoked sooner.  Performed at [facility], [place] [address] [place], [place] [address]   Respiratory Panel by PCR     Status: None   Collection Time: 10/10/19  6:57 PM   Specimen: Nasopharyngeal Swab; Respiratory  Result Value Ref Range Status   Adenovirus NOT DETECTED NOT DETECTED Final   Coronavirus 229E NOT DETECTED NOT DETECTED Final    Comment: (NOTE) The Coronavirus on the Respiratory Panel, DOES NOT test for the novel  Coronavirus (2019 nCoV)    Coronavirus HKU1 NOT DETECTED NOT DETECTED Final   Coronavirus NL63 NOT DETECTED NOT DETECTED Final   Coronavirus OC43 NOT DETECTED NOT DETECTED Final   Metapneumovirus NOT DETECTED NOT DETECTED Final   Rhinovirus / Enterovirus NOT DETECTED NOT DETECTED Final   Influenza A NOT DETECTED NOT DETECTED Final   Influenza B NOT  DETECTED NOT DETECTED Final   Parainfluenza Virus 1 NOT DETECTED NOT DETECTED Final   Parainfluenza Virus 2 NOT DETECTED NOT DETECTED Final   Parainfluenza Virus 3 NOT DETECTED NOT DETECTED Final   Parainfluenza Virus 4 NOT DETECTED NOT DETECTED Final   Respiratory Syncytial Virus NOT DETECTED NOT DETECTED Final   Bordetella pertussis NOT DETECTED NOT DETECTED Final   Chlamydophila pneumoniae NOT DETECTED NOT DETECTED Final   Mycoplasma pneumoniae NOT DETECTED NOT DETECTED Final    Comment: Performed at [facility], [place]. [address], [place], [place] [address]  Culture, blood (routine x 2)     Status: None (Preliminary result)   Collection Time: 10/11/19  1:21 PM   Specimen: BLOOD  Result Value Ref Range Status   Specimen Description   Final    BLOOD LEFT ANTECUBITAL Performed at [place] [address], [place], [place] [address]    Special Requests   Final    BOTTLES DRAWN AEROBIC AND ANAEROBIC Blood Culture adequate volume Performed at [facility], [place] [address], [place], [place] [address]    Culture   Final    NO GROWTH < 24 HOURS Performed at [place] [address], [place], [place] [address]    Report Status PENDING  Incomplete  Culture, blood (routine x 2)     Status: None (Preliminary result)   Collection Time: 10/11/19  1:28 PM   Specimen: BLOOD LEFT HAND  Result Value Ref Range Status   Specimen Description   Final    BLOOD LEFT HAND Performed at [place] [address], [place], [place] [address]    Special Requests   Final    BOTTLES DRAWN AEROBIC AND ANAEROBIC Blood Culture adequate volume Performed at [place] [address], [place], [place] [address]    Culture   Final    NO GROWTH < 24 HOURS Performed at [facility] [address], [place],  [address]    Report Status PENDING  Incomplete    Radiology Studies: No results  found.  Scheduled Meds: . acidophilus  1 capsule Oral BID  . apixaban  5 mg Oral BID  . atorvastatin  80 mg Oral Daily  . budesonide (PULMICORT) nebulizer solution  0.5 mg Nebulization BID  . calcium carbonate  1,250 mg Oral Daily  . chlorpheniramine-HYDROcodone  5 mL Oral Q12H  . cholecalciferol  2,000 Units Oral Daily  . clopidogrel  75 mg Oral Q breakfast  . diltiazem  120 mg Oral Daily  . donepezil  5 mg Oral Daily  . DULoxetine  60 mg Oral Daily  . fluticasone furoate-vilanterol  1 puff Inhalation Daily  . furosemide  40 mg Oral QODAY  . gabapentin  300 mg Oral TID  . guaiFENesin-dextromethorphan  10 mL Oral Q6H  . insulin aspart  0-9 Units Subcutaneous TID WC  . letrozole  2.5 mg Oral Daily  . metFORMIN  1,000 mg Oral BID WC  . methylPREDNISolone (SOLU-MEDROL) injection  40 mg Intravenous Q8H  . metoprolol succinate  25 mg Oral Daily  . multivitamin with minerals  1 tablet Oral Daily  . pantoprazole  40 mg Oral Daily  . potassium chloride SA  20 mEq Oral Daily  . Ensure Max Protein  11 oz Oral Daily  . sodium chloride flush  3 mL Intravenous Q12H  . umeclidinium bromide  1 puff Inhalation Daily   Continuous Infusions: . sodium chloride    . sodium chloride 10 mL/hr at 10/11/19 0425  . ceFEPime (MAXIPIME) IV 2 g (10/12/19 1421)     LOS: 2 days    Time spent:  25 mins.    Shawna Clamp, MD [facility]   If 7PM-7AM, please contact night-coverage

## 2019-10-12 NOTE — Progress Notes (Signed)
Rt placed pt on BIPAP due to her ABG.

## 2019-10-12 NOTE — Progress Notes (Signed)
Occupational Therapy Treatment Patient Details Name: Patricia Wall MRN: [id number] DOB: 1950/10/28 Today's Date: 10/12/2019    History of present illness 69 y.o. female with a known history of hypertension, diastolic heart failure, coronary artery disease, breast cancer status post lumpectomy and radiation treatment, type 2 diabetes, chronic respiratory failure with hypoxia requiring 4 L oxygen via nasal cannula with COPD is being admitted for acute on chronic hypoxic respiratory failure due to COPD exacerbation.   OT comments  Pt fatigued quickly this day  Follow Up Recommendations  Supervision/Assistance - 24 hour    Equipment Recommendations  None recommended by OT    Recommendations for Other Services      Precautions / Restrictions Precautions Precautions: Fall Precaution Comments: 4L O2  baseline       Mobility Bed Mobility Overal bed mobility: Needs Assistance Bed Mobility: Supine to Sit;Sit to Supine     Supine to sit: HOB elevated;Min assist Sit to supine: Min assist      Transfers        NT              Balance Overall balance assessment: Needs assistance         Standing balance support: Bilateral upper extremity supported Standing balance-Leahy Scale: Poor Standing balance comment: reliant on UE support                           ADL either performed or assessed with clinical judgement   ADL Overall ADL's : Needs assistance/impaired     Grooming: Wash/dry face;Sitting;Oral care;Brushing hair Grooming Details (indicate cue type and reason): sitting EOB                               General ADL Comments: Pt very tired but agreed to sit EOB with OT.  Pt did have on CPAP.  Pt did agree to grooming edge of but then wanted to lie back down.  Pt moves well but fatigued quickly               Cognition Arousal/Alertness: Awake/alert Behavior During Therapy: WFL for tasks assessed/performed Overall Cognitive  Status: Within Functional Limits for tasks assessed                                                     Pertinent Vitals/ Pain       Pain Assessment: No/denies pain     Prior Functioning/Environment              Frequency  Min 2X/week        Progress Toward Goals  OT Goals(current goals can now be found in the care plan section)     Acute Rehab OT Goals Patient Stated Goal: get stonger  Plan Discharge plan remains appropriate       AM-PAC OT "6 Clicks" Daily Activity     Outcome Measure   Help from another person eating meals?: A Little Help from another person taking care of personal grooming?: A Little Help from another person toileting, which includes using toliet, bedpan, or urinal?: A Lot Help from another person bathing (including washing, rinsing, drying)?: A Little Help from another person to put on and taking off regular upper body clothing?: A Lot  Help from another person to put on and taking off regular lower body clothing?: A Lot 6 Click Score: 15    End of Session Equipment Utilized During Treatment: Rolling walker  OT Visit Diagnosis: Unsteadiness on feet (R26.81);Other abnormalities of gait and mobility (R26.89);Repeated falls (R29.6);Muscle weakness (generalized) (M62.81)   Activity Tolerance Patient limited by fatigue   Patient Left in bed;with call bell/phone within reach;with bed alarm set   Nurse Communication Mobility status        Time: 4196-2229 OT Time Calculation (min): 17 min  Charges: OT General Charges $OT Visit: 1 Visit OT Treatments $Self Care/Home Management : 8-22 mins  Lise Auer, OT Acute Rehabilitation Services [phone] Office- [phone]      Ledford Goodson, Karin Golden D 10/12/2019, 3:06 PM

## 2019-10-13 LAB — GLUCOSE, CAPILLARY
Glucose-Capillary: 253 mg/dL — ABNORMAL HIGH (ref 70–99)
Glucose-Capillary: 297 mg/dL — ABNORMAL HIGH (ref 70–99)
Glucose-Capillary: 297 mg/dL — ABNORMAL HIGH (ref 70–99)
Glucose-Capillary: 342 mg/dL — ABNORMAL HIGH (ref 70–99)

## 2019-10-13 LAB — BASIC METABOLIC PANEL
Anion gap: 11 (ref 5–15)
BUN: 34 mg/dL — ABNORMAL HIGH (ref 8–23)
CO2: 31 mmol/L (ref 22–32)
Calcium: 9.1 mg/dL (ref 8.9–10.3)
Chloride: 92 mmol/L — ABNORMAL LOW (ref 98–111)
Creatinine, Ser: 0.76 mg/dL (ref 0.44–1.00)
GFR calc Af Amer: >60 mL/min (ref >60)
GFR calc non Af Amer: >60 mL/min (ref >60)
Glucose, Bld: 300 mg/dL — ABNORMAL HIGH (ref 70–99)
Potassium: 4.8 mmol/L (ref 3.5–5.1)
Sodium: 134 mmol/L — ABNORMAL LOW (ref 135–145)

## 2019-10-13 LAB — CBC
HCT: 32.7 % — ABNORMAL LOW (ref 36.0–46.0)
Hemoglobin: 9.8 g/dL — ABNORMAL LOW (ref 12.0–15.0)
MCH: 27.9 pg (ref 26.0–34.0)
MCHC: 30 g/dL (ref 30.0–36.0)
MCV: 93.2 fL (ref 80.0–100.0)
Platelets: 183 10*3/uL (ref 150–400)
RBC: 3.51 MIL/uL — ABNORMAL LOW (ref 3.87–5.11)
RDW: 15.9 % — ABNORMAL HIGH (ref 11.5–15.5)
WBC: 12.7 10*3/uL — ABNORMAL HIGH (ref 4.0–10.5)
nRBC: 0 % (ref 0.0–0.2)

## 2019-10-13 LAB — PHOSPHORUS: Phosphorus: 2.8 mg/dL (ref 2.5–4.6)

## 2019-10-13 LAB — MAGNESIUM: Magnesium: 2 mg/dL (ref 1.7–2.4)

## 2019-10-13 MED ORDER — INSULIN ASPART 100 UNIT/ML ~~LOC~~ SOLN
0.0000 [IU] | Freq: Four times a day (QID) | SUBCUTANEOUS | Status: DC
  Administered 2019-10-13: 11 [IU] via SUBCUTANEOUS
  Administered 2019-10-13: 8 [IU] via SUBCUTANEOUS
  Administered 2019-10-14: 3 [IU] via SUBCUTANEOUS
  Administered 2019-10-14 (×2): 8 [IU] via SUBCUTANEOUS
  Administered 2019-10-14: 3 [IU] via SUBCUTANEOUS
  Administered 2019-10-15: 11 [IU] via SUBCUTANEOUS
  Administered 2019-10-15: 15 [IU] via SUBCUTANEOUS
  Administered 2019-10-15: 5 [IU] via SUBCUTANEOUS

## 2019-10-13 NOTE — Progress Notes (Signed)
PROGRESS NOTE    Patricia Wall  EOF:[id number] DOB: 1950/12/07 DOA: 10/10/2019 PCP: Bernerd Limbo, MD    Brief Narrative: JoanBuchananis a69 y.o.femalewith a known history of hypertension, diastolic heart failure, coronary artery disease, breast cancer status post lumpectomy and radiation treatment, type 2 diabetes, chronic respiratory failure with hypoxia requiring 4 L oxygen via nasal cannula with COPD is being admitted for acute on chronic hypoxic respiratory failure due to COPD exacerbation. Patient was recently admitted from July 2 till July 5 of 2021.She has had 4 readmissions in last 32-month  Her daughter reports that patient has had this issue for a long time and typically requires 2 to 3 days of hospitalization,BiPAP and she normally responds well to that.  She was discharged with Trilogy on last admission and has been monitored by her pulmonologist as an outpatient. Her daughter request we put her on BiPAP immediately and give her some anxiety medicine.   Assessment & Plan:   Principal Problem:   Acute respiratory failure with hypoxia (HCC) Active Problems:   Essential hypertension   GERD   COPD exacerbation (HCC)   Acute on chronic respiratory failure with hypoxia and hypercapnia (HCC)   SIRS (systemic inflammatory response syndrome) (HCC)   Acute on chronic hypoxic and hypercapnic respiratory failure. -Due to COPD exacerbation History of COPD seems to be close to end-stage COPD, high flow oxygen demand, multiple hospitalization -Multiple admission, 4 re-admission in the past 6 months -ABG on admission 7.27/84.5/74.8 ABG this morning 7.369/62.6/72.0 -Patient was placed on BiPAP, has been weaned off currently on 4 L of oxygen, satting greater 92% -We will try to maintain O2 sat 88-92% adjust O2 demand accordingly -Started on IV Solu-Medrol, will initiate tapering down steroids -Continue empiric antibiotics -  -RT consulted , continue DuoNeb bronchodilator  treatments, mucolytic's, -SARS-CoV-2 negative x2 -We will try to obtain sputum for culture sensitivity -Respiratory viral panel >>> all negative. - Consider Pulmonology consult.   SIRS/ruling out sepsis -Patient met early SIRS criteria with T-max 100.4, tachypneic, tachycardic, with respiratory failure  But mostly above findings are more consistent with chronic respiratory failure -Nevertheless viral panel negative, blood cultures  No growth so far. -Empiric antibiotics broad-spectrum antibiotics of cefepime initiated --will be continued for now -10/11/2019 patient has much improved, afebrile, normotensive mild tachycardic likely due to treat breathing treatments-chronically tachypneic due to chronic respiratory status Lactic acid would not be reliable due to chronic respiratory failure, hypoxia  Paroxysmal A. Fib -Stable, rate controlled - continue Eliquis  Essential hypertension -Remained stable, continue home meds.  Hyperlipidemia  -Continue statins.  chronic diastolic CHF Well compensated at this time.  Type 2 diabetes Sliding scale insulin for now,consult diabetic nurse coordinator  Dr. SRoger Shelter discussed her hospice situation with her daughter. Daughter feels very strongly that she would like all care possible while here she is planning to call her hospice agency to notify about her hospitalization and receiving full care while here.  She is agreeable with DNR.   CODE STATUS:DNR  DVT prophylaxis: Eliquis Code Status: DNR Family Communication: No family member present at bedside  Disposition Plan:  Dispo: The patient is from: Home  Anticipated d/c is to: Home  Anticipated d/c date is: 3 days  Patient currently is not medically stable to d/c due to COPD exacerbation.    Consultants:   None.  Procedures: BIPAP Antimicrobials: Anti-infectives (From admission, onward)   Start     Dose/Rate Route Frequency  Ordered Stop   10/10/19 1630  ceFEPIme (  MAXIPIME) 2 g in sodium chloride 0.9 % 100 mL IVPB     Discontinue     2 g 200 mL/hr over 30 Minutes Intravenous Every 8 hours 10/10/19 1448 10/15/19 1359      Subjective: Patient was seen and examined at bedside.  She appears more alert, answers questions appropriately.  She has been weaned off of BiPAP to 4 L supplemental oxygen.  No overnight issues.  Objective: Vitals:   10/13/19 0321 10/13/19 0500 10/13/19 0847 10/13/19 1255  BP: (!) 139/104   (!) 118/56  Pulse: 75   86  Resp: 19   20  Temp:  97.6 F (36.4 C)  98.6 F (37 C)  TempSrc:  Axillary  Axillary  SpO2: 93%  93%   Weight:      Height:        Intake/Output Summary (Last 24 hours) at 10/13/2019 1257 Last data filed at 10/13/2019 1147 Gross per 24 hour  Intake --  Output 1300 ml  Net -1300 ml   Filed Weights   10/10/19 1844  Weight: 73.5 kg    Examination:  General exam: Appears calm and comfortable  Respiratory system: Clear to auscultation. Respiratory effort normal. Cardiovascular system: S1 & S2 heard, RRR. No JVD, murmurs, rubs, gallops or clicks. No pedal edema. Gastrointestinal system: Abdomen is nondistended, soft and nontender. No organomegaly or masses felt. Normal bowel sounds heard. Central nervous system: Alert and oriented. No focal neurological deficits. Extremities:  No edema, no leg swelling. Skin: No rashes, lesions or ulcers Psychiatry: Judgement and insight appear normal. Mood & affect appropriate.     Data Reviewed: I have personally reviewed following labs and imaging studies  CBC: Recent Labs  Lab 10/09/19 1959 10/10/19 1251 10/12/19 0459 10/13/19 0448  WBC 7.1 7.2 17.7* 12.7*  NEUTROABS 5.1 5.8  --   --   HGB 10.4* 9.8* 9.1* 9.8*  HCT 36.5 33.9* 30.1* 32.7*  MCV 97.1 96.9 93.8 93.2  PLT 161 125* 172 240   Basic Metabolic Panel: Recent Labs  Lab 10/09/19 1959 10/10/19 1251 10/12/19 0459 10/13/19 0448  NA 139 137 137 134*  K  4.6 4.1 4.5 4.8  CL 94* 90* 93* 92*  CO2 36* 39* 38* 31  GLUCOSE 238* 283* 194* 300*  BUN 15 17 34* 34*  CREATININE 0.91 0.70 0.88 0.76  CALCIUM 9.1 8.7* 9.0 9.1  MG  --   --   --  2.0  PHOS  --   --   --  2.8   GFR: Estimated Creatinine Clearance: 66.2 mL/min (by C-G formula based on SCr of 0.76 mg/dL). Liver Function Tests: Recent Labs  Lab 10/09/19 1959 10/10/19 1251  AST 16 17  ALT 20 20  ALKPHOS 92 75  BILITOT 0.5 0.4  PROT 6.6 6.4*  ALBUMIN 3.2* 3.3*   No results for input(s): LIPASE, AMYLASE in the last 168 hours. No results for input(s): AMMONIA in the last 168 hours. Coagulation Profile: No results for input(s): INR, PROTIME in the last 168 hours. Cardiac Enzymes: No results for input(s): CKTOTAL, CKMB, CKMBINDEX, TROPONINI in the last 168 hours. BNP (last 3 results) Recent Labs    04/15/19 1225  PROBNP 711*   HbA1C: No results for input(s): HGBA1C in the last 72 hours. CBG: Recent Labs  Lab 10/12/19 1154 10/12/19 1656 10/12/19 2114 10/13/19 0746 10/13/19 1144  GLUCAP 206* 238* 300* 297* 342*   Lipid Profile: No results for input(s): CHOL, HDL, LDLCALC, TRIG, CHOLHDL, LDLDIRECT in the  last 72 hours. Thyroid Function Tests: No results for input(s): TSH, T4TOTAL, FREET4, T3FREE, THYROIDAB in the last 72 hours. Anemia Panel: No results for input(s): VITAMINB12, FOLATE, FERRITIN, TIBC, IRON, RETICCTPCT in the last 72 hours. Sepsis Labs: No results for input(s): PROCALCITON, LATICACIDVEN in the last 168 hours.  Recent Results (from the past 240 hour(s))  SARS Coronavirus 2 by RT PCR (hospital order, performed in [facility] hospital lab) Nasopharyngeal Nasopharyngeal Swab     Status: None   Collection Time: 10/10/19 12:51 PM   Specimen: Nasopharyngeal Swab  Result Value Ref Range Status   SARS Coronavirus 2 NEGATIVE NEGATIVE Final    Comment: (NOTE) SARS-CoV-2 target nucleic acids are NOT DETECTED.  The SARS-CoV-2 RNA is generally detectable in  upper and lower respiratory specimens during the acute phase of infection. The lowest concentration of SARS-CoV-2 viral copies this assay can detect is 250 copies / mL. A negative result does not preclude SARS-CoV-2 infection and should not be used as the sole basis for treatment or other patient management decisions.  A negative result may occur with improper specimen collection / handling, submission of specimen other than nasopharyngeal swab, presence of viral mutation(s) within the areas targeted by this assay, and inadequate number of viral copies (<250 copies / mL). A negative result must be combined with clinical observations, patient history, and epidemiological information.  Fact Sheet for Patients:   StrictlyIdeas.no  Fact Sheet for Healthcare Providers: BankingDealers.co.za  This test is not yet approved or  cleared by the [place] FDA and has been authorized for detection and/or diagnosis of SARS-CoV-2 by FDA under an Emergency Use Authorization (EUA).  This EUA will remain in effect (meaning this test can be used) for the duration of the COVID-19 declaration under Section 564(b)(1) of the Act, 21 U.S.C. section 360bbb-3(b)(1), unless the authorization is terminated or revoked sooner.  Performed at [facility], [place] [address], [place], [place] [address]   Respiratory Panel by PCR     Status: None   Collection Time: 10/10/19  6:57 PM   Specimen: Nasopharyngeal Swab; Respiratory  Result Value Ref Range Status   Adenovirus NOT DETECTED NOT DETECTED Final   Coronavirus 229E NOT DETECTED NOT DETECTED Final    Comment: (NOTE) The Coronavirus on the Respiratory Panel, DOES NOT test for the novel  Coronavirus (2019 nCoV)    Coronavirus HKU1 NOT DETECTED NOT DETECTED Final   Coronavirus NL63 NOT DETECTED NOT DETECTED Final   Coronavirus OC43 NOT DETECTED NOT DETECTED Final   Metapneumovirus NOT  DETECTED NOT DETECTED Final   Rhinovirus / Enterovirus NOT DETECTED NOT DETECTED Final   Influenza A NOT DETECTED NOT DETECTED Final   Influenza B NOT DETECTED NOT DETECTED Final   Parainfluenza Virus 1 NOT DETECTED NOT DETECTED Final   Parainfluenza Virus 2 NOT DETECTED NOT DETECTED Final   Parainfluenza Virus 3 NOT DETECTED NOT DETECTED Final   Parainfluenza Virus 4 NOT DETECTED NOT DETECTED Final   Respiratory Syncytial Virus NOT DETECTED NOT DETECTED Final   Bordetella pertussis NOT DETECTED NOT DETECTED Final   Chlamydophila pneumoniae NOT DETECTED NOT DETECTED Final   Mycoplasma pneumoniae NOT DETECTED NOT DETECTED Final    Comment: Performed at [facility], [place]. [address], [place], [place] [address]  Culture, blood (routine x 2)     Status: None (Preliminary result)   Collection Time: 10/11/19  1:21 PM   Specimen: BLOOD  Result Value Ref Range Status   Specimen Description  Final    BLOOD LEFT ANTECUBITAL Performed at [facility] [address], [place], [place] [address]    Special Requests   Final    BOTTLES DRAWN AEROBIC AND ANAEROBIC Blood Culture adequate volume Performed at [facility] [address] [place] [address], [place], [place] [address]    Culture   Final    NO GROWTH 2 DAYS Performed at [facility] [address] [place], [place] [address]    Report Status PENDING  Incomplete  Culture, blood (routine x 2)     Status: None (Preliminary result)   Collection Time: 10/11/19  1:28 PM   Specimen: BLOOD LEFT HAND  Result Value Ref Range Status   Specimen Description   Final    BLOOD LEFT HAND Performed at [facility] [address], [place], [place] [address]    Special Requests   Final    BOTTLES DRAWN AEROBIC AND ANAEROBIC Blood Culture adequate volume Performed at [facility] [address], [place], [place] [address]    Culture   Final    NO GROWTH 2 DAYS Performed at  [facility] [address] [place], [place] [address]    Report Status PENDING  Incomplete    Radiology Studies: No results found.  Scheduled Meds: . acidophilus  1 capsule Oral BID  . apixaban  5 mg Oral BID  . atorvastatin  80 mg Oral Daily  . budesonide (PULMICORT) nebulizer solution  0.5 mg Nebulization BID  . calcium carbonate  1,250 mg Oral Daily  . chlorpheniramine-HYDROcodone  5 mL Oral Q12H  . cholecalciferol  2,000 Units Oral Daily  . clopidogrel  75 mg Oral Q breakfast  . diltiazem  120 mg Oral Daily  . donepezil  5 mg Oral Daily  . DULoxetine  60 mg Oral Daily  . fluticasone furoate-vilanterol  1 puff Inhalation Daily  . furosemide  40 mg Oral QODAY  . gabapentin  300 mg Oral TID  . guaiFENesin-dextromethorphan  10 mL Oral Q6H  . insulin aspart  0-15 Units Subcutaneous Q6H  . letrozole  2.5 mg Oral Daily  . metFORMIN  1,000 mg Oral BID WC  . methylPREDNISolone (SOLU-MEDROL) injection  40 mg Intravenous Q8H  . metoprolol succinate  25 mg Oral Daily  . multivitamin with minerals  1 tablet Oral Daily  . pantoprazole  40 mg Oral Daily  . potassium chloride SA  20 mEq Oral Daily  . Ensure Max Protein  11 oz Oral Daily  . sodium chloride flush  3 mL Intravenous Q12H  . umeclidinium bromide  1 puff Inhalation Daily   Continuous Infusions: . sodium chloride    . sodium chloride 10 mL/hr at 10/11/19 0425  . ceFEPime (MAXIPIME) IV 2 g (10/13/19 0609)     LOS: 3 days    Time spent:  25 mins.    Shawna Clamp, MD [facility]   If 7PM-7AM, please contact night-coverage

## 2019-10-14 LAB — BASIC METABOLIC PANEL
Anion gap: 9 (ref 5–15)
BUN: 36 mg/dL — ABNORMAL HIGH (ref 8–23)
CO2: 34 mmol/L — ABNORMAL HIGH (ref 22–32)
Calcium: 9.1 mg/dL (ref 8.9–10.3)
Chloride: 90 mmol/L — ABNORMAL LOW (ref 98–111)
Creatinine, Ser: 0.82 mg/dL (ref 0.44–1.00)
GFR calc Af Amer: >60 mL/min (ref >60)
GFR calc non Af Amer: >60 mL/min (ref >60)
Glucose, Bld: 198 mg/dL — ABNORMAL HIGH (ref 70–99)
Potassium: 4.4 mmol/L (ref 3.5–5.1)
Sodium: 133 mmol/L — ABNORMAL LOW (ref 135–145)

## 2019-10-14 LAB — CBC
HCT: 32.1 % — ABNORMAL LOW (ref 36.0–46.0)
Hemoglobin: 9.9 g/dL — ABNORMAL LOW (ref 12.0–15.0)
MCH: 28.2 pg (ref 26.0–34.0)
MCHC: 30.8 g/dL (ref 30.0–36.0)
MCV: 91.5 fL (ref 80.0–100.0)
Platelets: 193 10*3/uL (ref 150–400)
RBC: 3.51 MIL/uL — ABNORMAL LOW (ref 3.87–5.11)
RDW: 16 % — ABNORMAL HIGH (ref 11.5–15.5)
WBC: 13.4 10*3/uL — ABNORMAL HIGH (ref 4.0–10.5)
nRBC: 0 % (ref 0.0–0.2)

## 2019-10-14 LAB — GLUCOSE, CAPILLARY
Glucose-Capillary: 185 mg/dL — ABNORMAL HIGH (ref 70–99)
Glucose-Capillary: 192 mg/dL — ABNORMAL HIGH (ref 70–99)
Glucose-Capillary: 275 mg/dL — ABNORMAL HIGH (ref 70–99)
Glucose-Capillary: 327 mg/dL — ABNORMAL HIGH (ref 70–99)

## 2019-10-14 NOTE — Progress Notes (Signed)
PROGRESS NOTE    Patricia Wall  XBD:[id number] DOB: 05/21/1950 DOA: 10/10/2019 PCP: Bernerd Limbo, MD    Brief Narrative: JoanBuchananis a69 y.o.femalewith a known history of hypertension, diastolic heart failure, coronary artery disease, breast cancer status post lumpectomy and radiation treatment, type 2 diabetes, chronic respiratory failure with hypoxia requiring 4 L oxygen via nasal cannula with COPD is being admitted for acute on chronic hypoxic respiratory failure due to COPD exacerbation. Patient was recently admitted from July 2 till July 5 of 2021.She has had 4 readmissions in last 67-month  Her daughter reports that patient has had this issue for a long time and typically requires 2 to 3 days of hospitalization,BiPAP and she normally responds well to that.  She was discharged with Trilogy on last admission and has been monitored by her pulmonologist as an outpatient.  Patient has a very high risk of readmission.  she has 4 readmission in last 6 months for COPD exacerbation.   Assessment & Plan:   Principal Problem:   Acute respiratory failure with hypoxia (HCC) Active Problems:   Essential hypertension   GERD   COPD exacerbation (HCC)   Acute on chronic respiratory failure with hypoxia and hypercapnia (HCC)   SIRS (systemic inflammatory response syndrome) (HCC)   Acute on chronic hypoxic and hypercapnic respiratory failure requiring BIPAP. -Due to COPD exacerbation - History of COPD seems to be close to end-stage COPD, high flow oxygen demand, multiple hospitalization -Multiple admission, 4 re-admission in the past 6 months -ABG on admission 7.27/84.5/74.8 ABG this morning 7.369/62.6/72.0 -Patient was placed on BiPAP, has been weaned off currently on 4 L of oxygen, satting greater 92% -We will try to maintain O2 sat 88-92% adjust O2 demand accordingly - Continue IV Solu-Medrol, will initiate tapering down steroids - Continue empiric antibiotics -  -RT  consulted , continue DuoNeb bronchodilator treatments, mucolytic's, -SARS-CoV-2 negative x2 -We will try to obtain sputum for culture sensitivity -Respiratory viral panel >>> all negative. - Consider Pulmonology consult.   SIRS/ruling out sepsis -Patient met early SIRS criteria with T-max 100.4, tachypneic, tachycardic, with respiratory failure  But mostly above findings are more consistent with chronic respiratory failure -Nevertheless viral panel negative, blood cultures  No growth so far. -Empiric antibiotics broad-spectrum antibiotics of cefepime initiated --will switch to oral antibiotics on discharge. -10/11/2019 patient has much improved, afebrile, normotensive mild tachycardic likely due to treat breathing treatments-chronically tachypneic due to chronic respiratory status Lactic acid would not be reliable due to chronic respiratory failure, hypoxia  Paroxysmal A. Fib -Stable, rate controlled - continue Eliquis  Essential hypertension -Remained stable, continue home meds.  Hyperlipidemia  -Continue statins.  chronic diastolic CHF Well compensated at this time.  Type 2 diabetes Sliding scale insulin for now,consult diabetic nurse coordinator  Dr. SR[facility]er [facility] discussed her hospice situation with her daughter. Daughter feels very strongly that she would like all care possible while here she is planning to call her hospice agency to notify about her hospitalization and receiving full care while here.  She is agreeable with DNR.   CODE STATUS:DNR  DVT prophylaxis: Eliquis Code Status: DNR Family Communication: No family member present at bedside  Disposition Plan:  Dispo: The patient is from: Home  Anticipated d/c is to: Home with home hospice.  Anticipated d/c date is: 3 days.  Patient currently is not medically stable to d/c due to COPD exacerbation.    Consultants:   None.  Procedures:  BIPAP Antimicrobials: Anti-infectives (From admission, onward)   Start  Dose/Rate Route Frequency Ordered Stop   10/10/19 1630  ceFEPIme (MAXIPIME) 2 g in sodium chloride 0.9 % 100 mL IVPB     Discontinue     2 g 200 mL/hr over 30 Minutes Intravenous Every 8 hours 10/10/19 1448 10/15/19 1359      Subjective: Patient was seen and examined at bedside.  She appears more alert, answers questions appropriately.  She has been weaned off of BiPAP to 4 L supplemental oxygen.  No overnight issues.  She continues to have significant wheezing on examination.  Objective: Vitals:   10/14/19 0515 10/14/19 0838 10/14/19 0853 10/14/19 1331  BP: (!) 124/57  (!) 151/69 (!) 143/78  Pulse: 74  86 96  Resp: 14   19  Temp: 97.6 F (36.4 C)   98 F (36.7 C)  TempSrc: Oral   Oral  SpO2: 100% 96%  95%  Weight:      Height:        Intake/Output Summary (Last 24 hours) at 10/14/2019 1343 Last data filed at 10/14/2019 0528 Gross per 24 hour  Intake 480 ml  Output 800 ml  Net -320 ml   Filed Weights   10/10/19 1844  Weight: 73.5 kg    Examination:  General exam: Appears calm and comfortable  Respiratory system: Clear to auscultation. Respiratory effort normal. Cardiovascular system: S1 & S2 heard, RRR. No JVD, murmurs, rubs, gallops or clicks. No pedal edema. Gastrointestinal system: Abdomen is nondistended, soft and nontender. No organomegaly or masses felt. Normal bowel sounds heard. Central nervous system: Alert and oriented. No focal neurological deficits. Extremities:  No edema, no leg swelling. Skin: No rashes, lesions or ulcers Psychiatry: Judgement and insight appear normal. Mood & affect appropriate.     Data Reviewed: I have personally reviewed following labs and imaging studies  CBC: Recent Labs  Lab 10/09/19 1959 10/10/19 1251 10/12/19 0459 10/13/19 0448 10/14/19 0531  WBC 7.1 7.2 17.7* 12.7* 13.4*  NEUTROABS 5.1 5.8  --   --   --   HGB 10.4* 9.8* 9.1* 9.8* 9.9*  HCT  36.5 33.9* 30.1* 32.7* 32.1*  MCV 97.1 96.9 93.8 93.2 91.5  PLT 161 125* 172 183 734   Basic Metabolic Panel: Recent Labs  Lab 10/09/19 1959 10/10/19 1251 10/12/19 0459 10/13/19 0448 10/14/19 0531  NA 139 137 137 134* 133*  K 4.6 4.1 4.5 4.8 4.4  CL 94* 90* 93* 92* 90*  CO2 36* 39* 38* 31 34*  GLUCOSE 238* 283* 194* 300* 198*  BUN 15 17 34* 34* 36*  CREATININE 0.91 0.70 0.88 0.76 0.82  CALCIUM 9.1 8.7* 9.0 9.1 9.1  MG  --   --   --  2.0  --   PHOS  --   --   --  2.8  --    GFR: Estimated Creatinine Clearance: 64.6 mL/min (by C-G formula based on SCr of 0.82 mg/dL). Liver Function Tests: Recent Labs  Lab 10/09/19 1959 10/10/19 1251  AST 16 17  ALT 20 20  ALKPHOS 92 75  BILITOT 0.5 0.4  PROT 6.6 6.4*  ALBUMIN 3.2* 3.3*   No results for input(s): LIPASE, AMYLASE in the last 168 hours. No results for input(s): AMMONIA in the last 168 hours. Coagulation Profile: No results for input(s): INR, PROTIME in the last 168 hours. Cardiac Enzymes: No results for input(s): CKTOTAL, CKMB, CKMBINDEX, TROPONINI in the last 168 hours. BNP (last 3 results) Recent Labs    04/15/19 1225  PROBNP 711*   HbA1C:  No results for input(s): HGBA1C in the last 72 hours. CBG: Recent Labs  Lab 10/13/19 1144 10/13/19 1758 10/13/19 2354 10/14/19 0520 10/14/19 1145  GLUCAP 342* 297* 253* 192* 275*   Lipid Profile: No results for input(s): CHOL, HDL, LDLCALC, TRIG, CHOLHDL, LDLDIRECT in the last 72 hours. Thyroid Function Tests: No results for input(s): TSH, T4TOTAL, FREET4, T3FREE, THYROIDAB in the last 72 hours. Anemia Panel: No results for input(s): VITAMINB12, FOLATE, FERRITIN, TIBC, IRON, RETICCTPCT in the last 72 hours. Sepsis Labs: No results for input(s): PROCALCITON, LATICACIDVEN in the last 168 hours.  Recent Results (from the past 240 hour(s))  SARS Coronavirus 2 by RT PCR (hospital order, performed in [facility] hospital lab) Nasopharyngeal Nasopharyngeal Swab      Status: None   Collection Time: 10/10/19 12:51 PM   Specimen: Nasopharyngeal Swab  Result Value Ref Range Status   SARS Coronavirus 2 NEGATIVE NEGATIVE Final    Comment: (NOTE) SARS-CoV-2 target nucleic acids are NOT DETECTED.  The SARS-CoV-2 RNA is generally detectable in upper and lower respiratory specimens during the acute phase of infection. The lowest concentration of SARS-CoV-2 viral copies this assay can detect is 250 copies / mL. A negative result does not preclude SARS-CoV-2 infection and should not be used as the sole basis for treatment or other patient management decisions.  A negative result may occur with improper specimen collection / handling, submission of specimen other than nasopharyngeal swab, presence of viral mutation(s) within the areas targeted by this assay, and inadequate number of viral copies (<250 copies / mL). A negative result must be combined with clinical observations, patient history, and epidemiological information.  Fact Sheet for Patients:   StrictlyIdeas.no  Fact Sheet for Healthcare Providers: BankingDealers.co.za  This test is not yet approved or  cleared by the [place] FDA and has been authorized for detection and/or diagnosis of SARS-CoV-2 by FDA under an Emergency Use Authorization (EUA).  This EUA will remain in effect (meaning this test can be used) for the duration of the COVID-19 declaration under Section 564(b)(1) of the Act, 21 U.S.C. section 360bbb-3(b)(1), unless the authorization is terminated or revoked sooner.  Performed at [facility], [place] [address] [place], [place] [address]   Respiratory Panel by PCR     Status: None   Collection Time: 10/10/19  6:57 PM   Specimen: Nasopharyngeal Swab; Respiratory  Result Value Ref Range Status   Adenovirus NOT DETECTED NOT DETECTED Final   Coronavirus 229E NOT DETECTED NOT DETECTED Final    Comment:  (NOTE) The Coronavirus on the Respiratory Panel, DOES NOT test for the novel  Coronavirus (2019 nCoV)    Coronavirus HKU1 NOT DETECTED NOT DETECTED Final   Coronavirus NL63 NOT DETECTED NOT DETECTED Final   Coronavirus OC43 NOT DETECTED NOT DETECTED Final   Metapneumovirus NOT DETECTED NOT DETECTED Final   Rhinovirus / Enterovirus NOT DETECTED NOT DETECTED Final   Influenza A NOT DETECTED NOT DETECTED Final   Influenza B NOT DETECTED NOT DETECTED Final   Parainfluenza Virus 1 NOT DETECTED NOT DETECTED Final   Parainfluenza Virus 2 NOT DETECTED NOT DETECTED Final   Parainfluenza Virus 3 NOT DETECTED NOT DETECTED Final   Parainfluenza Virus 4 NOT DETECTED NOT DETECTED Final   Respiratory Syncytial Virus NOT DETECTED NOT DETECTED Final   Bordetella pertussis NOT DETECTED NOT DETECTED Final   Chlamydophila pneumoniae NOT DETECTED NOT DETECTED Final   Mycoplasma pneumoniae NOT DETECTED NOT DETECTED Final    Comment: Performed at  [facility], [place] [address] [place], [place] [address]  Culture, blood (routine x 2)     Status: None (Preliminary result)   Collection Time: 10/11/19  1:21 PM   Specimen: BLOOD  Result Value Ref Range Status   Specimen Description   Final    BLOOD LEFT ANTECUBITAL Performed at [place] [address] [place], [place] [address]    Special Requests   Final    BOTTLES DRAWN AEROBIC AND ANAEROBIC Blood Culture adequate volume Performed at [facility] [address], [place], [place] [address]    Culture   Final    NO GROWTH 3 DAYS Performed at [facility], [place] [address], [place], [place] [address]    Report Status PENDING  Incomplete  Culture, blood (routine x 2)     Status: None (Preliminary result)   Collection Time: 10/11/19  1:28 PM   Specimen: BLOOD LEFT HAND  Result Value Ref Range Status   Specimen Description   Final    BLOOD LEFT HAND Performed at [place]  [address], [place], [place] [address]    Special Requests   Final    BOTTLES DRAWN AEROBIC AND ANAEROBIC Blood Culture adequate volume Performed at [place] [place] [address], [place], [place] [address]    Culture   Final    NO GROWTH 3 DAYS Performed at [facility], [place] [address] [place],  [address]    Report Status PENDING  Incomplete    Radiology Studies: No results found.  Scheduled Meds: . acidophilus  1 capsule Oral BID  . apixaban  5 mg Oral BID  . atorvastatin  80 mg Oral Daily  . budesonide (PULMICORT) nebulizer solution  0.5 mg Nebulization BID  . calcium carbonate  1,250 mg Oral Daily  . chlorpheniramine-HYDROcodone  5 mL Oral Q12H  . cholecalciferol  2,000 Units Oral Daily  . clopidogrel  75 mg Oral Q breakfast  . diltiazem  120 mg Oral Daily  . donepezil  5 mg Oral Daily  . DULoxetine  60 mg Oral Daily  . fluticasone furoate-vilanterol  1 puff Inhalation Daily  . furosemide  40 mg Oral QODAY  . gabapentin  300 mg Oral TID  . guaiFENesin-dextromethorphan  10 mL Oral Q6H  . insulin aspart  0-15 Units Subcutaneous Q6H  . letrozole  2.5 mg Oral Daily  . metFORMIN  1,000 mg Oral BID WC  . methylPREDNISolone (SOLU-MEDROL) injection  40 mg Intravenous Q8H  . metoprolol succinate  25 mg Oral Daily  . multivitamin with minerals  1 tablet Oral Daily  . pantoprazole  40 mg Oral Daily  . potassium chloride SA  20 mEq Oral Daily  . Ensure Max Protein  11 oz Oral Daily  . sodium chloride flush  3 mL Intravenous Q12H  . umeclidinium bromide  1 puff Inhalation Daily   Continuous Infusions: . sodium chloride    . sodium chloride 10 mL/hr at 10/11/19 0425  . ceFEPime (MAXIPIME) IV 2 g (10/14/19 1308)     LOS: 4 days    Time spent:  25 mins.    Shawna Clamp, MD [facility]   If 7PM-7AM, please contact night-coverage

## 2019-10-14 NOTE — Progress Notes (Signed)
I responded to a request from the nurse to provide spiritual care for the patient. I visited the patient's room and provided spiritual support through pastoral presence, by sharing words of encouragement and by leading in prayer.    10/14/19 1359  Clinical Encounter Type  Visited With Patient  Visit Type Spiritual support  Referral From Nurse  Consult/Referral To Chaplain  Spiritual Encounters  Spiritual Needs Prayer    Chaplain Dr Melvyn Novas

## 2019-10-14 NOTE — Progress Notes (Addendum)
Placed PT on BiPAP for napping. PT states  is breathing comfortably at this time. All settings and vitals are on Flowsheet.

## 2019-10-14 NOTE — Progress Notes (Signed)
PT already off BiPAP at 0837 - on 3 lpm nasal cannula. PT is A&O x4. PT does not appear to be in respiratory distress at this time (PT states she is at baseline with WOB).

## 2019-10-14 NOTE — Progress Notes (Signed)
PT Cancellation Note  Patient Details Name: Patricia Wall MRN: [id number] DOB: 1950-10-01   Cancelled Treatment:    Reason Eval/Treat Not Completed: Pt currently resting/asleep on BIPAP. Will check back another day.    Faye Ramsay, PT Acute Rehabilitation  Office: [phone] Pager: [phone]

## 2019-10-15 LAB — GLUCOSE, CAPILLARY
Glucose-Capillary: 247 mg/dL — ABNORMAL HIGH (ref 70–99)
Glucose-Capillary: 359 mg/dL — ABNORMAL HIGH (ref 70–99)
Glucose-Capillary: 225 mg/dL — ABNORMAL HIGH (ref 70–99)

## 2019-10-15 LAB — BASIC METABOLIC PANEL
Anion gap: 9 (ref 5–15)
BUN: 32 mg/dL — ABNORMAL HIGH (ref 8–23)
CO2: 32 mmol/L (ref 22–32)
Calcium: 9.1 mg/dL (ref 8.9–10.3)
Chloride: 91 mmol/L — ABNORMAL LOW (ref 98–111)
Creatinine, Ser: 0.86 mg/dL (ref 0.44–1.00)
GFR calc Af Amer: >60 mL/min (ref >60)
GFR calc non Af Amer: >60 mL/min (ref >60)
Glucose, Bld: 270 mg/dL — ABNORMAL HIGH (ref 70–99)
Potassium: 4.8 mmol/L (ref 3.5–5.1)
Sodium: 132 mmol/L — ABNORMAL LOW (ref 135–145)

## 2019-10-15 LAB — CBC
HCT: 33.5 % — ABNORMAL LOW (ref 36.0–46.0)
Hemoglobin: 10.5 g/dL — ABNORMAL LOW (ref 12.0–15.0)
MCH: 28.4 pg (ref 26.0–34.0)
MCHC: 31.3 g/dL (ref 30.0–36.0)
MCV: 90.5 fL (ref 80.0–100.0)
Platelets: 197 10*3/uL (ref 150–400)
RBC: 3.7 MIL/uL — ABNORMAL LOW (ref 3.87–5.11)
RDW: 16 % — ABNORMAL HIGH (ref 11.5–15.5)
WBC: 12.1 10*3/uL — ABNORMAL HIGH (ref 4.0–10.5)
nRBC: 0 % (ref 0.0–0.2)

## 2019-10-15 MED ORDER — PREDNISONE 10 MG (21) PO TBPK
ORAL_TABLET | ORAL | 0 refills | Status: AC
Start: 2019-10-15 — End: ?

## 2019-10-15 MED ORDER — FLUTICASONE FUROATE-VILANTEROL 100-25 MCG/INH IN AEPB: 1.0000 | INHALATION_SPRAY | Freq: Every day | RESPIRATORY_TRACT | 0 refills | Status: AC

## 2019-10-15 NOTE — Discharge Instructions (Signed)
Advised to follow-up with primary care physician. Advised to take prednisone taper as directed. Advised to continue oxygen as needed.

## 2019-10-15 NOTE — Discharge Planning (Addendum)
Physician Discharge Summary  Patricia Wall DJS:[id number] DOB: 1950-10-27 DOA: 10/10/2019  PCP: Bernerd Limbo, MD  Admit date: 10/10/2019   Discharge date: 10/15/2019  Admitted From: Home.  Disposition:  Home hospice SERVICES  Recommendations for Outpatient Follow-up:  1. Follow up with PCP in 1-2 weeks 2. Please obtain BMP/CBC in one week 3. Advised to take prednisone taper as directed. 4. Advised to continue oxygen as needed.  Home Health: Home hospice. Equipment/Devices:Oxygen, Trilogy   Discharge Condition: Stable. CODE STATUS:DNR Diet recommendation: Heart Healthy   Brief Summary / Hospital course: JoanBuchananis a69 y.o.femalewith a known history of hypertension, diastolic heart failure, coronary artery disease, breast cancer status post lumpectomy and radiation treatment, type 2 diabetes, chronic respiratory failure with hypoxia requiring 4 L oxygen via nasal cannula with COPD is being admitted for acute on chronic hypoxic respiratory failure due to COPD exacerbation. Patient was recently admitted from July 2 till July 5 of 2021.She has had 4 readmissions in last 80-month Her daughter reports that patient has had this issue for a long time and typically requires 2 to 3 days of hospitalization,BiPAP and she normally responds well to that.  She was discharged withTrilogyon last admission and has been monitored by her pulmonologist as an outpatient. Patient was admitted for COPD exacerbation requiring BiPAP, she was continued on IV Solu-Medrol, DuoNeb nebulization.  She was also given cefepime for 6 days for COPD.  She was afebrile,, blood cultures were negative.  She has made very slow improvement, wheezing has improved,   She was continued on supplemental oxygen to maintain saturation above 98%.  Her oxygen requirement has significantly improved.  All other home medications were continued.  Patient has trilogy machine at home, emphasized the importance of using trilogy  machine.  Advised to take prednisone taper as directed and follow-up with pulmonology as scheduled.  Discharge Diagnoses:  Active Problems:   Essential hypertension   GERD   Acute on chronic respiratory failure with hypoxia and hypercapnia (HCC)  Acute on chronic hypoxic and hypercapnic respiratory failure. -Due to COPD exacerbation History of COPD seems to be close to end-stage COPD, high flow oxygen demand, multiple hospitalization -Multiple admission, 4 re-admission in the past 6 months -ABG on admission 7.27/84.5/74.8ABG this morning 7.369/62.6/72.0 -Patient was placed on BiPAP, has been weaned off currently on 4 L of oxygen, satting greater 92% -We will try to maintain O2 sat 88-92% adjust O2 demand accordingly -Started on IV Solu-Medrol, will initiate tapering down steroids -Continue empiric antibiotics-  -RT consulted , continue DuoNeb bronchodilator treatments, mucolytic's, -SARS-CoV-2 negative x2 -We will try to obtain sputum for culture sensitivity -Respiratory viral panel>>>all negative.   SIRS/ruling out sepsis -Patient met early SIRS criteria with T-max 100.4, tachypneic, tachycardic,with respiratory failure  But mostly above findings are more consistent with chronic respiratory failure -Nevertheless viral panel negative, blood cultures  No growth so far. -Empiric antibiotics broad-spectrum antibiotics of cefepime initiated--completed for 6 days. -10/11/2019 patient has much improved, afebrile, normotensive mild tachycardic likely due to treat breathing treatments-chronically tachypneic due to chronic respiratory status Lactic acid would not be reliable due to chronic respiratory failure, hypoxia  Paroxysmal A. Fib -Stable, rate controlled - continue Eliquis  Essential hypertension -Remained stable, continue home meds.  Hyperlipidemia -Continue statins.  chronic diastolic CHF Well compensated at this time.  Type 2 diabetes Sliding scale insulin  for now,consult diabetic nurse coordinator   Discharge Instructions  Discharge Instructions    Call MD for:  difficulty breathing, headache or visual disturbances  Complete by: As directed    Call MD for:  persistant dizziness or light-headedness   Complete by: As directed    Call MD for:  persistant nausea and vomiting   Complete by: As directed    Diet - low sodium heart healthy   Complete by: As directed    Discharge instructions   Complete by: As directed    Advised to follow-up with primary care physician. Advised to take prednisone taper as directed. Advised to continue oxygen as needed.   Increase activity slowly   Complete by: As directed      Allergies as of 10/15/2019   No Known Allergies     Medication List    STOP taking these medications   morphine 20 MG/5ML solution   valACYclovir 1000 MG tablet Commonly known as: VALTREX     TAKE these medications   acetaminophen 325 MG tablet Commonly known as: TYLENOL Take 2 tablets (650 mg total) by mouth every 4 (four) hours as needed for headache or mild pain.   acidophilus Caps capsule Take 1 capsule by mouth 2 (two) times daily.   albuterol (2.5 MG/3ML) 0.083% nebulizer solution Commonly known as: PROVENTIL USE 1 VIAL IN NEBULIZER EVERY 4 HOURS FOR WHEEZING OR SHORTNESS OF BREATH. What changed:   how much to take  how to take this  when to take this  reasons to take this  additional instructions   albuterol 108 (90 Base) MCG/ACT inhaler Commonly known as: Ventolin HFA Inhale 2 puffs into the lungs every 6 (six) hours as needed for wheezing or shortness of breath. What changed: Another medication with the same name was changed. Make sure you understand how and when to take each.   ALPRAZolam 0.25 MG tablet Commonly known as: XANAX Take 1 tablet (0.25 mg total) by mouth 2 (two) times daily as needed for anxiety.   apixaban 5 MG Tabs tablet Commonly known as: ELIQUIS Take 1 tablet (5 mg total)  by mouth 2 (two) times daily.   atorvastatin 80 MG tablet Commonly known as: LIPITOR Take 80 mg by mouth daily.   blood glucose meter kit and supplies Dispense based on patient and insurance preference. Use up to four times daily as directed. (FOR ICD-9 250.00, 250.01). What changed:   how much to take  how to take this  when to take this   calcium carbonate 600 MG Tabs tablet Commonly known as: OS-CAL Take 600 mg by mouth every morning.   clopidogrel 75 MG tablet Commonly known as: PLAVIX Take 1 tablet (75 mg total) by mouth daily with breakfast.   diltiazem 120 MG 24 hr capsule Commonly known as: CARDIZEM CD Take 120 mg by mouth daily.   donepezil 5 MG tablet Commonly known as: ARICEPT Take 5 mg by mouth daily.   DULoxetine 60 MG capsule Commonly known as: CYMBALTA Take 60 mg by mouth every morning.   fluticasone furoate-vilanterol 100-25 MCG/INH Aepb Commonly known as: BREO ELLIPTA Inhale 1 puff into the lungs daily for 30 doses. Start taking on: October 16, 2019   Flutter Devi Use as directed What changed:   how much to take  how to take this  when to take this  additional instructions   freestyle lancets Use as instructed What changed:   how much to take  how to take this  when to take this  additional instructions   furosemide 40 MG tablet Commonly known as: LASIX Take 40 mg by mouth every other day.  gabapentin 300 MG capsule Commonly known as: NEURONTIN Take 300 mg by mouth 3 (three) times daily.   ipratropium-albuterol 0.5-2.5 (3) MG/3ML Soln Commonly known as: DUONEB Take 3 mLs by nebulization in the morning, at noon, in the evening, and at bedtime.   letrozole 2.5 MG tablet Commonly known as: FEMARA Take 2.5 mg by mouth every morning.   lidocaine 5 % Commonly known as: LIDODERM Place 1 patch onto the skin as needed (pain).   metFORMIN 1000 MG tablet Commonly known as: GLUCOPHAGE Take 1,000 mg by mouth 2 (two) times  daily.   methocarbamol 750 MG tablet Commonly known as: ROBAXIN Take 750 mg by mouth every 8 (eight) hours as needed for muscle spasms.   metoprolol succinate 25 MG 24 hr tablet Commonly known as: TOPROL-XL Take 25 mg by mouth daily.   multivitamin tablet Take 1 tablet by mouth daily.   nitroGLYCERIN 0.4 MG SL tablet Commonly known as: NITROSTAT Place 1 tablet (0.4 mg total) under the tongue every 5 (five) minutes as needed for chest pain (may repeat x3).   OXYGEN 1 each by Other route See admin instructions. Use 4L 24/7   pantoprazole 40 MG tablet Commonly known as: PROTONIX Take 40 mg by mouth daily.   potassium chloride SA 20 MEQ tablet Commonly known as: Klor-Con M20 Take 1 tablet (20 mEq total) by mouth daily.   predniSONE 10 MG (21) Tbpk tablet Commonly known as: STERAPRED UNI-PAK 21 TAB Take it as directed.   Tiotropium Bromide Monohydrate 2.5 MCG/ACT Aers Commonly known as: Spiriva Respimat Inhale 2 puffs into the lungs daily.   Vitamin D 50 MCG (2000 UT) tablet Take 2,000 Units by mouth every morning.            Durable Medical Equipment  (From admission, onward)         Start     Ordered   10/15/19 1146  DME Oxygen  Once       Question Answer Comment  Length of Need Lifetime   Mode or (Route) Nasal cannula   Liters per Minute 3   Frequency Continuous (stationary and portable oxygen unit needed)   Oxygen conserving device Yes   Oxygen delivery system Gas      10/15/19 1146          Follow-up Information    Bernerd Limbo, MD Follow up in 1 week(s).   Specialty: Family Medicine Contact information: [address] [place] [address] [phone]        Josue Hector, MD .   Specialty: Cardiology Contact information: [phone] [address] [address] [phone]              No Known Allergies  Consultations:  None.   Procedures/Studies: DG Chest 2 View  Result Date:  10/09/2019 CLINICAL DATA:  Worsening shortness of breath today. History of COPD. EXAM: CHEST - 2 VIEW COMPARISON:  09/20/2019 FINDINGS: Heart size is normal. Aortic atherosclerosis as seen previously. Chronic interstitial lung markings at least partly due to chronic lung disease. There may be an element of superimposed interstitial edema. No advanced edema. No effusions. No acute bone finding. Old augmented lower thoracic fracture. IMPRESSION: Chronic lung disease. Some interstitial markings related to that. There could be an element of superimposed acute interstitial edema as well. Electronically Signed   By: Nelson Chimes M.D.   On: 10/09/2019 20:42   CT Head Wo Contrast  Result Date: 09/20/2019 CLINICAL DATA:  Encephalopathy.  Respiratory distress. EXAM: CT HEAD WITHOUT CONTRAST TECHNIQUE: Contiguous axial images were obtained from the base of the skull through the vertex without intravenous contrast. COMPARISON:  04/26/2019 FINDINGS: Brain: Mild age related volume loss. No old or acute focal infarction, mass lesion, hemorrhage, hydrocephalus or extra-axial collection. Vascular: There is atherosclerotic calcification of the major vessels at the base of the brain. Skull: Negative Sinuses/Orbits: Clear/normal Other: None IMPRESSION: No acute or significant head CT finding. Mild age related volume loss. Electronically Signed   By: Nelson Chimes M.D.   On: 09/20/2019 10:48   CT ABDOMEN PELVIS W CONTRAST  Result Date: 09/20/2019 CLINICAL DATA:  Abdominal pain. EXAM: CT ABDOMEN AND PELVIS WITH CONTRAST TECHNIQUE: Multidetector CT imaging of the abdomen and pelvis was performed using the standard protocol following bolus administration of intravenous contrast. CONTRAST:  162m OMNIPAQUE IOHEXOL 300 MG/ML  SOLN COMPARISON:  06/08/2019 FINDINGS: Lower chest: Redemonstration of emphysema but without focal infiltrate, collapse or effusion. Pericardial effusion in left pleural effusion previously seen have resolved. Skin  thickening of the left breast again. Hepatobiliary: Previous cholecystectomy. Liver parenchyma appears normal. Pancreas: Normal Spleen: Normal Adrenals/Urinary Tract: Adrenal glands are normal. Kidneys are normal. Bladder is normal. Stomach/Bowel: Stomach and small intestine appear unremarkable. Moderate amount of fecal matter within the colon but without evidence inflammation or frank obstruction. Vascular/Lymphatic: Aortic atherosclerosis. No aneurysm. IVC is normal. No retroperitoneal adenopathy. Reproductive: Previous hysterectomy.  No pelvic mass. Other: No free air or fluid. Musculoskeletal: Ordinary lumbar degenerative changes. Previous vertebral augmentation L1. IMPRESSION: No acute abdominal finding. Large amount stool and gas in the colon which could conceivably be symptomatic. Aortic Atherosclerosis (ICD10-I70.0) and Emphysema (ICD10-J43.9). Skin thickening of the left breast as seen previously. Resolution of previously seen pericardial effusion left pleural effusion. Electronically Signed   By: MNelson ChimesM.D.   On: 09/20/2019 10:51   DG Chest Port 1 View  Result Date: 10/10/2019 CLINICAL DATA:  Respiratory distress, shortness of breath EXAM: PORTABLE CHEST 1 VIEW COMPARISON:  Portable exam 1242 hours compared to 10/09/2019 FINDINGS: Normal heart size, mediastinal contours, and pulmonary vascularity. Coronary stent noted. Atherosclerotic calcification aorta. Emphysematous changes with prominent interstitial markings at both lung bases unchanged since previous exam as well as an earlier study of 06/08/2019. No definite acute infiltrate, pleural effusion or pneumothorax. Bones demineralized. IMPRESSION: Changes of COPD and stable appearing chronic interstitial lung disease. No acute abnormalities. Electronically Signed   By: MLavonia DanaM.D.   On: 10/10/2019 13:05   DG Chest Portable 1 View  Result Date: 09/20/2019 CLINICAL DATA:  Shortness of breath. EXAM: PORTABLE CHEST 1 VIEW COMPARISON:   09/04/2019.  06/16/2019.  06/08/2019.  04/26/2019. FINDINGS: Mediastinum and hilar structures normal. Heart size normal. Chronic bilateral interstitial prominence. Findings most consistent chronic interstitial lung disease. Superimposed active pneumonitis cannot be excluded. No pleural effusion or pneumothorax. No acute bony abnormality. IMPRESSION: Chronic bilateral interstitial prominence. Findings most consistent chronic interstitial lung disease. Superimposed pneumonitis cannot be excluded. Electronically Signed   By: TMarcello Moores Register   On: 09/20/2019 07:54   DG Swallowing Func-Speech Pathology  Result Date: 09/23/2019 Objective Swallowing Evaluation: Type of Study: MBS-Modified Barium Swallow Study  Patient Details Name: JHARJOT ZAVADILMRN: [id number] of Birth: 306-Jan-1952Today's Date: 09/23/2019 Time: SLP Start Time (ACUTE ONLY): 0948 -SLP Stop Time (ACUTE ONLY): 1000 SLP Time Calculation (min) (ACUTE ONLY): 12 min Past Medical History: Past Medical History: Diagnosis Date . Abnormal chest xray  . ABUSE, ALCOHOL, UNSPECIFIED 09/04/2006  Qualifier: Diagnosis of  By: Amil Amen MD, Benjamine Mola   . Acute diastolic CHF (congestive heart failure) ([place])  . Acute on chronic respiratory failure with hypercapnia ([place])  . Acute respiratory failure with hypoxia ([place]) 01/07/2015 . Alcohol abuse  . Angina, class III ([place]) 03/29/2007  Qualifier: Diagnosis of  By: Amil Amen MD, Benjamine Mola   . ARF (acute renal failure) ([place]) 04/27/2019 . Back pain  . CAD (coronary artery disease)    Prev seen by [facility].  Stent to OM and LAD Last cath 6/11 cathed on June 13, [name]. 2011.  The cardiac catheterization showed 30% and 50% lesions in the mid  and distal LAD, 50% in the OM1, 40% in the OM2 with 20% in-stent  restenosis, 40% in-stent restenosis in the circumflex and no significant  disease in the RCA.  Her EF was normal.  Dr. Olevia Perches recommended a trial  of proton pump inhibitors  . CAD S/P percutaneous coronary angioplasty: Prior Cypher DES  to pLAD, pOM2 & AVG Cx (after Om2); New DES to AVG Cx ISR & Angiosculpt PTCA of Ostial AVG Cx from OM2. 09/04/2006  Qualifier: Diagnosis of  By: Amil Amen MD, Benjamine Mola   . Chest pain  . COPD (chronic obstructive pulmonary disease) ([place])  . COPD GOLD II / still smoking  09/04/2006  Followed in Pulmonary clinic/ [facility]/ [place]  Active smoker  - pft's 12/12/12 FEV1  1.41 (53%) ratio 54 with no significant reversibility and DLCO 47% corrects to 50 01/16/2013  Walked RA x 2 laps @ 185 ft each stopped due to sob, no desat     - Trial of anoro 01/16/2013 > helped but insurance would not pay - incruse one daily started 10/02/2013 >>>not covered  -Spiriva restarted 10/15/13 > . Cyanosis  . DDD (degenerative disc disease)  . DEGENERATIVE DISC DISEASE, CERVICAL SPINE 09/04/2006  Annotation: c6-7 on the right Qualifier: Diagnosis of  By: Amil Amen MD, Benjamine Mola   . Depression  . Diarrhea  . Diverticular disease  . DIVERTICULOSIS, COLON 09/04/2006  Qualifier: Diagnosis of  By: Amil Amen MD, Benjamine Mola   . GERD (gastroesophageal reflux disease)  . H/O: hysterectomy  . Hemorrhoids  . HTN (hypertension)  . IBS (irritable bowel syndrome)  . Insomnia  . Irritable bowel syndrome 09/04/2006  Qualifier: Diagnosis of  By: Amil Amen MD, Benjamine Mola   . Lymphadenitis  . PEPTIC ULCER DISEASE 09/04/2006  Annotation: distant history of  Qualifier: Diagnosis of  By: Amil Amen MD, Benjamine Mola   . Pharyngitis  . PHARYNGITIS 07/18/2007  Qualifier: Diagnosis of  By: Radene Ou MD, [place]   . PUD (peptic ulcer disease)  . PVD (peripheral vascular disease) ([place])  . Sinusitis  . TIA (transient ischemic attack)  . Tobacco abuse  . Vertigo  Past Surgical History: Past Surgical History: Procedure Laterality Date . ABDOMINAL HYSTERECTOMY   . APPENDECTOMY   . BREAST LUMPECTOMY Left 2015 . CARDIAC CATHETERIZATION  2005, 2006, 2009,2011 . CHOLECYSTECTOMY   . CORONARY STENT INTERVENTION N/A 04/22/2019  Procedure: CORONARY STENT INTERVENTION;  Surgeon: Troy Sine,  MD;  Location: [facility];  Service: Cardiovascular;  Laterality: N/A; . CORONARY STENT PLACEMENT    Status post balloon angiogram plasty and Cypher stent to the distal    circumflex and OM2 branch . ESOPHAGOGASTRODUODENOSCOPY (EGD) WITH PROPOFOL Left 04/21/2019  Procedure: ESOPHAGOGASTRODUODENOSCOPY (EGD) WITH PROPOFOL;  Surgeon: Arta Silence, MD;  Location: [facility];  Service: Endoscopy;  Laterality: Left; . Hammertoe repair   . Left Bunionectomy   . LEFT HEART CATH AND  CORONARY ANGIOGRAPHY N/A 04/22/2019  Procedure: LEFT HEART CATH AND CORONARY ANGIOGRAPHY;  Surgeon: Troy Sine, MD;  Location: [facility];  Service: Cardiovascular;  Laterality: N/A; . LEFT HEART CATHETERIZATION WITH CORONARY ANGIOGRAM N/A 12/03/2013  Procedure: LEFT HEART CATHETERIZATION WITH CORONARY ANGIOGRAM;  Surgeon: Leonie Man, MD;  Location: [facility];  Service: Cardiovascular;  Laterality: N/A; . TUBAL LIGATION   HPI: 69 year old lady with prior history of COPD, chronic respiratory failure with hypoxia on 4 L of nasal cannula oxygen at baseline, hypertension, diastolic heart failure, coronary artery disease, breast cancer s/p lumpectomy and radiation treatment, type 2 diabetes mellitus presents with shortness of breath for the last 2 days without any relief.  On arrival to ED she was found to be hypoxic requiring BiPAP and ABG shows pH of 7.2, PCO2 of 94 and PO2 of 105.  Patient was admitted for acute respiratory failure secondary to acute COPD exacerbation  No data recorded Assessment / Plan / Recommendation CHL IP CLINICAL IMPRESSIONS 09/23/2019 Clinical Impression Oropharyngeal phases of swallow were normal without baseline cough prior to MBS. Timing and strength prevented abnormal penetration and aspiration. Flash penetration with thin which is common and not dysfunctional. No difficulty orally transiting pill with timely transit to stomach. Educated pt and daughter to take rest breaks during meals, defer  eating/drinking if experiencing coughing episodes. Straws allowed and pills with thin. Upgrade diet texture to regular and liquids to thin. No follow up needed.   SLP Visit Diagnosis Dysphagia, unspecified (R13.10) Attention and concentration deficit following -- Frontal lobe and executive function deficit following -- Impact on safety and function Mild aspiration risk   CHL IP TREATMENT RECOMMENDATION 09/23/2019 Treatment Recommendations No treatment recommended at this time   Prognosis 04/19/2019 Prognosis for Safe Diet Advancement Good Barriers to Reach Goals -- Barriers/Prognosis Comment -- CHL IP DIET RECOMMENDATION 09/23/2019 SLP Diet Recommendations Regular solids;Thin liquid Liquid Administration via Cup;Straw Medication Administration Whole meds with liquid Compensations Slow rate;Small sips/bites;Other (Comment) Postural Changes Seated upright at 90 degrees   CHL IP OTHER RECOMMENDATIONS 09/23/2019 Recommended Consults -- Oral Care Recommendations Oral care BID Other Recommendations --   CHL IP FOLLOW UP RECOMMENDATIONS 09/23/2019 Follow up Recommendations None   CHL IP FREQUENCY AND DURATION 04/19/2019 Speech Therapy Frequency (ACUTE ONLY) min 2x/week Treatment Duration 2 weeks      CHL IP ORAL PHASE 09/23/2019 Oral Phase WFL Oral - Pudding Teaspoon -- Oral - Pudding Cup -- Oral - Honey Teaspoon -- Oral - Honey Cup -- Oral - Nectar Teaspoon -- Oral - Nectar Cup -- Oral - Nectar Straw -- Oral - Thin Teaspoon -- Oral - Thin Cup -- Oral - Thin Straw -- Oral - Puree -- Oral - Mech Soft -- Oral - Regular -- Oral - Multi-Consistency -- Oral - Pill -- Oral Phase - Comment --  CHL IP PHARYNGEAL PHASE 09/23/2019 Pharyngeal Phase WFL Pharyngeal- Pudding Teaspoon -- Pharyngeal -- Pharyngeal- Pudding Cup -- Pharyngeal -- Pharyngeal- Honey Teaspoon -- Pharyngeal -- Pharyngeal- Honey Cup -- Pharyngeal -- Pharyngeal- Nectar Teaspoon -- Pharyngeal -- Pharyngeal- Nectar Cup -- Pharyngeal -- Pharyngeal- Nectar Straw -- Pharyngeal --  Pharyngeal- Thin Teaspoon -- Pharyngeal -- Pharyngeal- Thin Cup -- Pharyngeal -- Pharyngeal- Thin Straw -- Pharyngeal -- Pharyngeal- Puree -- Pharyngeal -- Pharyngeal- Mechanical Soft -- Pharyngeal -- Pharyngeal- Regular -- Pharyngeal -- Pharyngeal- Multi-consistency -- Pharyngeal -- Pharyngeal- Pill -- Pharyngeal -- Pharyngeal Comment --  CHL IP CERVICAL ESOPHAGEAL PHASE 09/23/2019 Cervical Esophageal Phase WFL Pudding Teaspoon --  Pudding Cup -- Honey Teaspoon -- Honey Cup -- Nectar Teaspoon -- Nectar Cup -- Nectar Straw -- Thin Teaspoon -- Thin Cup -- Thin Straw -- Puree -- Mechanical Soft -- Regular -- Multi-consistency -- Pill -- Cervical Esophageal Comment -- Houston Siren 09/23/2019, 10:34 AM Orbie Pyo Colvin Caroli.Ed Actor Pager [phone] Office [phone]              ECHOCARDIOGRAM COMPLETE  Result Date: 09/22/2019    ECHOCARDIOGRAM REPORT   Patient Name:   SONA NATIONS Date of Exam: 09/22/2019 Medical Rec #:  [id number]       Height:       66.0 in Accession #:    [id number]      Weight:       151.0 lb Date of Birth:  1951/01/03       BSA:          1.775 m Patient Age:    81 years        BP:           146/71 mmHg Patient Gender: F               HR:           108 bpm. Exam Location:  Inpatient Procedure: 2D Echo, Cardiac Doppler and Color Doppler Indications:    R55 Syncope  History:        Patient has prior history of Echocardiogram examinations, most                 recent 04/19/2019.  Sonographer:    Merrie Roof RDCS Referring Phys: [place]  1. Left ventricular ejection fraction, by estimation, is 60 to 65%. The left ventricle has normal function. The left ventricle has no regional wall motion abnormalities. Left ventricular diastolic parameters were normal.  2. Right ventricular systolic function is low normal. The right ventricular size is mildly enlarged. There is mildly elevated pulmonary artery systolic pressure. The estimated right ventricular  systolic pressure is 31.5 mmHg.  3. Left atrial size was upper normal.  4. The mitral valve is grossly normal, mild annular calcification. Trivial mitral valve regurgitation.  5. The aortic valve is tricuspid. Aortic valve regurgitation is not visualized. Mild aortic valve sclerosis is present, with no evidence of aortic valve stenosis.  6. The inferior vena cava is normal in size with greater than 50% respiratory variability, suggesting right atrial pressure of 3 mmHg. FINDINGS  Left Ventricle: Left ventricular ejection fraction, by estimation, is 60 to 65%. The left ventricle has normal function. The left ventricle has no regional wall motion abnormalities. The left ventricular internal cavity size was normal in size. There is  no left ventricular hypertrophy. Left ventricular diastolic parameters were normal. Right Ventricle: The right ventricular size is mildly enlarged. No increase in right ventricular wall thickness. Right ventricular systolic function is low normal. There is mildly elevated pulmonary artery systolic pressure. The tricuspid regurgitant velocity is 2.99 m/s, and with an assumed right atrial pressure of 3 mmHg, the estimated right ventricular systolic pressure is 17.6 mmHg. Left Atrium: Left atrial size was upper normal. Right Atrium: Right atrial size was normal in size. Pericardium: There is no evidence of pericardial effusion. Mitral Valve: The mitral valve is grossly normal. Mild mitral annular calcification. Trivial mitral valve regurgitation. Tricuspid Valve: The tricuspid valve is grossly normal. Tricuspid valve regurgitation is mild. Aortic Valve: The aortic valve is tricuspid. Aortic valve regurgitation is not visualized. Mild aortic  valve sclerosis is present, with no evidence of aortic valve stenosis. Mild aortic valve annular calcification. Pulmonic Valve: The pulmonic valve was not well visualized. Pulmonic valve regurgitation is not visualized. Aorta: The aortic root is normal in  size and structure. Venous: The inferior vena cava is normal in size with greater than 50% respiratory variability, suggesting right atrial pressure of 3 mmHg. IAS/Shunts: No atrial level shunt detected by color flow Doppler.  LEFT VENTRICLE PLAX 2D LVIDd:         4.10 cm     Diastology LVIDs:         2.80 cm     LV e' lateral:   8.70 cm/s LV PW:         0.90 cm     LV E/e' lateral: 11.0 LV IVS:        0.80 cm     LV e' medial:    9.46 cm/s LVOT diam:     2.00 cm     LV E/e' medial:  10.1 LV SV:         67 LV SV Index:   38 LVOT Area:     3.14 cm  LV Volumes (MOD) LV vol d, MOD A2C: 66.8 ml LV vol d, MOD A4C: 83.3 ml LV vol s, MOD A2C: 30.2 ml LV vol s, MOD A4C: 33.7 ml LV SV MOD A2C:     36.6 ml LV SV MOD A4C:     83.3 ml LV SV MOD BP:      45.1 ml RIGHT VENTRICLE             IVC RV Basal diam:  3.60 cm     IVC diam: 1.80 cm RV S prime:     11.20 cm/s TAPSE (M-mode): 1.8 cm LEFT ATRIUM             Index       RIGHT ATRIUM           Index LA diam:        3.90 cm 2.20 cm/m  RA Area:     18.50 cm LA Vol (A2C):   55.9 ml 31.50 ml/m RA Volume:   58.60 ml  33.02 ml/m LA Vol (A4C):   49.4 ml 27.84 ml/m LA Biplane Vol: 54.5 ml 30.71 ml/m  AORTIC VALVE LVOT Vmax:   106.00 cm/s LVOT Vmean:  75.200 cm/s LVOT VTI:    0.212 m  AORTA Ao Root diam: 3.00 cm MITRAL VALVE                TRICUSPID VALVE MV Area (PHT): 6.37 cm     TR Peak grad:   35.8 mmHg MV Decel Time: 119 msec     TR Vmax:        299.00 cm/s MV E velocity: 95.50 cm/s MV A velocity: 121.00 cm/s  SHUNTS MV E/A ratio:  0.79         Systemic VTI:  0.21 m                             Systemic Diam: 2.00 cm Rozann Lesches MD Electronically signed by Rozann Lesches MD Signature Date/Time: 09/22/2019/4:53:11 PM    Final        Subjective: Patient was seen and examined at bedside.  Denies any chest pain.  Wheezing has resolved.  She is on 3 L supplemental oxygen saturating 96%.  She wants to be discharged home.  Discharge Exam: Vitals:   10/15/19 0743  10/15/19 0800  BP:  (!) 159/60  Pulse:  104  Resp:  21  Temp:    SpO2: 94%    Vitals:   10/15/19 0300 10/15/19 0500 10/15/19 0743 10/15/19 0800  BP: (!) 164/74 (!) 143/127  (!) 159/60  Pulse: 85 87  104  Resp: _0 Temp:  99.5 F (37.5 C)    TempSrc:  Oral    SpO2: 95% 96% 94%   Weight:      Height:        General: Pt is alert, awake, not in acute distress Cardiovascular: RRR, S1/S2 +, no rubs, no gallops Respiratory: CTA bilaterally, no wheezing, no rhonchi Abdominal: Soft, NT, ND, bowel sounds + Extremities: no edema, no cyanosis    The results of significant diagnostics from this hospitalization (including imaging, microbiology, ancillary and laboratory) are listed below for reference.     Microbiology: Recent Results (from the past 240 hour(s))  SARS Coronavirus 2 by RT PCR (hospital order, performed in [facility] hospital lab) Nasopharyngeal Nasopharyngeal Swab     Status: None   Collection Time: 10/10/19 12:51 PM   Specimen: Nasopharyngeal Swab  Result Value Ref Range Status   SARS Coronavirus 2 NEGATIVE NEGATIVE Final    Comment: (NOTE) SARS-CoV-2 target nucleic acids are NOT DETECTED.  The SARS-CoV-2 RNA is generally detectable in upper and lower respiratory specimens during the acute phase of infection. The lowest concentration of SARS-CoV-2 viral copies this assay can detect is 250 copies / mL. A negative result does not preclude SARS-CoV-2 infection and should not be used as the sole basis for treatment or other patient management decisions.  A negative result may occur with improper specimen collection / handling, submission of specimen other than nasopharyngeal swab, presence of viral mutation(s) within the areas targeted by this assay, and inadequate number of viral copies (<250 copies / mL). A negative result must be combined with clinical observations, patient history, and epidemiological information.  Fact Sheet for Patients:    StrictlyIdeas.no  Fact Sheet for Healthcare Providers: BankingDealers.co.za  This test is not yet approved or  cleared by the [place] FDA and has been authorized for detection and/or diagnosis of SARS-CoV-2 by FDA under an Emergency Use Authorization (EUA).  This EUA will remain in effect (meaning this test can be used) for the duration of the COVID-19 declaration under Section 564(b)(1) of the Act, 21 U.S.C. section 360bbb-3(b)(1), unless the authorization is terminated or revoked sooner.  Performed at [facility], [place] [address] [place], [place] [address]   Respiratory Panel by PCR     Status: None   Collection Time: 10/10/19  6:57 PM   Specimen: Nasopharyngeal Swab; Respiratory  Result Value Ref Range Status   Adenovirus NOT DETECTED NOT DETECTED Final   Coronavirus 229E NOT DETECTED NOT DETECTED Final    Comment: (NOTE) The Coronavirus on the Respiratory Panel, DOES NOT test for the novel  Coronavirus (2019 nCoV)    Coronavirus HKU1 NOT DETECTED NOT DETECTED Final   Coronavirus NL63 NOT DETECTED NOT DETECTED Final   Coronavirus OC43 NOT DETECTED NOT DETECTED Final   Metapneumovirus NOT DETECTED NOT DETECTED Final   Rhinovirus / Enterovirus NOT DETECTED NOT DETECTED Final   Influenza A NOT DETECTED NOT DETECTED Final   Influenza B NOT DETECTED NOT DETECTED Final   Parainfluenza Virus 1 NOT DETECTED NOT DETECTED Final   Parainfluenza Virus 2 NOT DETECTED NOT DETECTED  Final   Parainfluenza Virus 3 NOT DETECTED NOT DETECTED Final   Parainfluenza Virus 4 NOT DETECTED NOT DETECTED Final   Respiratory Syncytial Virus NOT DETECTED NOT DETECTED Final   Bordetella pertussis NOT DETECTED NOT DETECTED Final   Chlamydophila pneumoniae NOT DETECTED NOT DETECTED Final   Mycoplasma pneumoniae NOT DETECTED NOT DETECTED Final    Comment: Performed at [facility], [place] [address], [place], [place] [address]   Culture, blood (routine x 2)     Status: None (Preliminary result)   Collection Time: 10/11/19  1:21 PM   Specimen: BLOOD  Result Value Ref Range Status   Specimen Description   Final    BLOOD LEFT ANTECUBITAL Performed at [place] [address] [place], [place] [address]    Special Requests   Final    BOTTLES DRAWN AEROBIC AND ANAEROBIC Blood Culture adequate volume Performed at [place] [address] [place], [place] [address]    Culture   Final    NO GROWTH 3 DAYS Performed at [facility], [place] [address], [place], [place] [address]    Report Status PENDING  Incomplete  Culture, blood (routine x 2)     Status: None (Preliminary result)   Collection Time: 10/11/19  1:28 PM   Specimen: BLOOD LEFT HAND  Result Value Ref Range Status   Specimen Description   Final    BLOOD LEFT HAND Performed at [place] [address], [place], [place] [address]    Special Requests   Final    BOTTLES DRAWN AEROBIC AND ANAEROBIC Blood Culture adequate volume Performed at [place] [address], [place], [place] [address]    Culture   Final    NO GROWTH 3 DAYS Performed at [facility], [place] [address] [place], [place] [address]    Report Status PENDING  Incomplete     Labs: BNP (last 3 results) Recent Labs    06/16/19 1321 09/04/19 0935 09/20/19 0753  BNP 101.4* 41.2 01.7   Basic Metabolic Panel: Recent Labs  Lab 10/10/19 1251 10/12/19 0459 10/13/19 0448 10/14/19 0531 10/15/19 0512  NA 137 137 134* 133* 132*  K 4.1 4.5 4.8 4.4 4.8  CL 90* 93* 92* 90* 91*  CO2 39* 38* 31 34* 32  GLUCOSE 283* 194* 300* 198* 270*  BUN 17 34* 34* 36* 32*  CREATININE 0.70 0.88 0.76 0.82 0.86  CALCIUM 8.7* 9.0 9.1 9.1 9.1  MG  --   --  2.0  --   --   PHOS  --   --  2.8  --   --    Liver Function Tests: Recent Labs  Lab 10/09/19 1959 10/10/19 1251  AST 16 17  ALT 20 20   ALKPHOS 92 75  BILITOT 0.5 0.4  PROT 6.6 6.4*  ALBUMIN 3.2* 3.3*   No results for input(s): LIPASE, AMYLASE in the last 168 hours. No results for input(s): AMMONIA in the last 168 hours. CBC: Recent Labs  Lab 10/09/19 1959 10/09/19 1959 10/10/19 1251 10/12/19 0459 10/13/19 0448 10/14/19 0531 10/15/19 0512  WBC 7.1   < > 7.2 17.7* 12.7* 13.4* 12.1*  NEUTROABS 5.1  --  5.8  --   --   --   --   HGB 10.4*   < > 9.8* 9.1* 9.8* 9.9* 10.5*  HCT 36.5   < > 33.9* 30.1* 32.7* 32.1* 33.5*  MCV 97.1   < >  96.9 93.8 93.2 91.5 90.5  PLT 161   < > 125* 172 183 193 197   < > = values in this interval not displayed.   Cardiac Enzymes: No results for input(s): CKTOTAL, CKMB, CKMBINDEX, TROPONINI in the last 168 hours. BNP: Invalid input(s): POCBNP CBG: Recent Labs  Lab 10/14/19 0520 10/14/19 1145 10/14/19 1836 10/14/19 2357 10/15/19 0528  GLUCAP 192* 275* 185* 327* 247*   D-Dimer No results for input(s): DDIMER in the last 72 hours. Hgb A1c No results for input(s): HGBA1C in the last 72 hours. Lipid Profile No results for input(s): CHOL, HDL, LDLCALC, TRIG, CHOLHDL, LDLDIRECT in the last 72 hours. Thyroid function studies No results for input(s): TSH, T4TOTAL, T3FREE, THYROIDAB in the last 72 hours.  Invalid input(s): FREET3 Anemia work up No results for input(s): VITAMINB12, FOLATE, FERRITIN, TIBC, IRON, RETICCTPCT in the last 72 hours. Urinalysis    Component Value Date/Time   COLORURINE YELLOW 06/08/2019 [place] 06/08/2019 0613   LABSPEC 1.018 06/08/2019 0613   PHURINE 5.0 06/08/2019 0613   GLUCOSEU NEGATIVE 06/08/2019 0613   HGBUR NEGATIVE 06/08/2019 0613   BILIRUBINUR NEGATIVE 06/08/2019 0613   KETONESUR NEGATIVE 06/08/2019 0613   PROTEINUR 30 (A) 06/08/2019 0613   UROBILINOGEN 1.0 10/10/2009 2052   NITRITE NEGATIVE 06/08/2019 0613   LEUKOCYTESUR NEGATIVE 06/08/2019 1610   Sepsis Labs Invalid input(s): PROCALCITONIN,  WBC,   LACTICIDVEN Microbiology Recent Results (from the past 240 hour(s))  SARS Coronavirus 2 by RT PCR (hospital order, performed in [facility] hospital lab) Nasopharyngeal Nasopharyngeal Swab     Status: None   Collection Time: 10/10/19 12:51 PM   Specimen: Nasopharyngeal Swab  Result Value Ref Range Status   SARS Coronavirus 2 NEGATIVE NEGATIVE Final    Comment: (NOTE) SARS-CoV-2 target nucleic acids are NOT DETECTED.  The SARS-CoV-2 RNA is generally detectable in upper and lower respiratory specimens during the acute phase of infection. The lowest concentration of SARS-CoV-2 viral copies this assay can detect is 250 copies / mL. A negative result does not preclude SARS-CoV-2 infection and should not be used as the sole basis for treatment or other patient management decisions.  A negative result may occur with improper specimen collection / handling, submission of specimen other than nasopharyngeal swab, presence of viral mutation(s) within the areas targeted by this assay, and inadequate number of viral copies (<250 copies / mL). A negative result must be combined with clinical observations, patient history, and epidemiological information.  Fact Sheet for Patients:   StrictlyIdeas.no  Fact Sheet for Healthcare Providers: BankingDealers.co.za  This test is not yet approved or  cleared by the [place] FDA and has been authorized for detection and/or diagnosis of SARS-CoV-2 by FDA under an Emergency Use Authorization (EUA).  This EUA will remain in effect (meaning this test can be used) for the duration of the COVID-19 declaration under Section 564(b)(1) of the Act, 21 U.S.C. section 360bbb-3(b)(1), unless the authorization is terminated or revoked sooner.  Performed at [facility], [place] [address] [place], [place] [address]   Respiratory Panel by PCR     Status: None   Collection Time: 10/10/19  6:57 PM    Specimen: Nasopharyngeal Swab; Respiratory  Result Value Ref Range Status   Adenovirus NOT DETECTED NOT DETECTED Final   Coronavirus 229E NOT DETECTED NOT DETECTED Final    Comment: (NOTE) The Coronavirus on the Respiratory Panel, DOES NOT test for the novel  Coronavirus (2019 nCoV)  Coronavirus HKU1 NOT DETECTED NOT DETECTED Final   Coronavirus NL63 NOT DETECTED NOT DETECTED Final   Coronavirus OC43 NOT DETECTED NOT DETECTED Final   Metapneumovirus NOT DETECTED NOT DETECTED Final   Rhinovirus / Enterovirus NOT DETECTED NOT DETECTED Final   Influenza A NOT DETECTED NOT DETECTED Final   Influenza B NOT DETECTED NOT DETECTED Final   Parainfluenza Virus 1 NOT DETECTED NOT DETECTED Final   Parainfluenza Virus 2 NOT DETECTED NOT DETECTED Final   Parainfluenza Virus 3 NOT DETECTED NOT DETECTED Final   Parainfluenza Virus 4 NOT DETECTED NOT DETECTED Final   Respiratory Syncytial Virus NOT DETECTED NOT DETECTED Final   Bordetella pertussis NOT DETECTED NOT DETECTED Final   Chlamydophila pneumoniae NOT DETECTED NOT DETECTED Final   Mycoplasma pneumoniae NOT DETECTED NOT DETECTED Final    Comment: Performed at [facility], [place] [address], [place], [place] [address]  Culture, blood (routine x 2)     Status: None (Preliminary result)   Collection Time: 10/11/19  1:21 PM   Specimen: BLOOD  Result Value Ref Range Status   Specimen Description   Final    BLOOD LEFT ANTECUBITAL Performed at [place] [address], [place], [place] [address]    Special Requests   Final    BOTTLES DRAWN AEROBIC AND ANAEROBIC Blood Culture adequate volume Performed at [place] [address] [place], [place] [address]    Culture   Final    NO GROWTH 3 DAYS Performed at [facility], [place] [address], [place], [place] [address]    Report Status PENDING  Incomplete  Culture, blood (routine x 2)     Status: None (Preliminary result)   Collection Time:  10/11/19  1:28 PM   Specimen: BLOOD LEFT HAND  Result Value Ref Range Status   Specimen Description   Final    BLOOD LEFT HAND Performed at [place] [address], [place], [place] [address]    Special Requests   Final    BOTTLES DRAWN AEROBIC AND ANAEROBIC Blood Culture adequate volume Performed at [place] [address] [place], [place] [address]    Culture   Final    NO GROWTH 3 DAYS Performed at [facility], [place] [address], [place], [place] [address]    Report Status PENDING  Incomplete     Time coordinating discharge: Over 30 minutes  SIGNED:   Shawna Clamp, MD  [facility] 10/15/2019, 11:47 AM Pager   If 7PM-7AM, please contact night-coverage [email or web address]

## 2019-10-15 NOTE — Plan of Care (Signed)
Discharge instructions reviewed with patient, questions answered, verbalized understanding.  Patient awaiting PTAR for transport home.

## 2019-10-15 NOTE — Discharge Summary (Signed)
Discharge  Summary completed under discharge planning.

## 2019-10-15 NOTE — Care Management Important Message (Signed)
Important Message  Patient Details IM Letter presented to the Patient Name: Patricia Wall MRN: [id number] Date of Birth: 12-12-1950   Medicare Important Message Given:  Yes     Caren Macadam 10/15/2019, 3:26 PM

## 2019-10-15 NOTE — TOC Transition Note (Signed)
Transition of Care [facility]) - CM/SW Discharge Note   Patient Details  Name: ANN BOHNE MRN: [id number] Date of Birth: [birth year]  Transition of Care [facility]) CM/SW Contact:  Darleene Cleaver, LCSW Phone Number: 10/15/2019, 1:06 PM   Clinical Narrative:     Patient will be going home with home hospice through [facility].  CSW signing off please reconsult with any other social work needs, home hospice agency has been notified of planned discharge.  Patient's daughter is requesting EMS transport to patient's home.  CSW to arrange EMS.   Final next level of care: Home w Hospice Care Barriers to Discharge: Barriers Resolved   Patient Goals and CMS Choice Patient states their goals for this hospitalization and ongoing recovery are:: To return back home with hospice services CMS Medicare.gov Compare Post Acute Care list provided to:: Patient Represenative (must comment) Choice offered to / list presented to : Adult Children  Discharge Placement    Home with hospice services, patient already has equipment at home including oxygen.              Name of family member notified: Daughter Leeroy Bock Patient and family notified of of transfer: 10/15/19  Discharge Plan and Services                  DME Agency: NA       HH Arranged:  (Home Hospice Services through [facility]) [facility] Agency: [facility] Date [facility] Agency Contacted: 10/15/19 Time HH Agency Contacted: 1305 Representative spoke with at [facility] Agency: Apolonio Schneiders  Social Determinants of Health (SDOH) Interventions     Readmission Risk Interventions No flowsheet data found.

## 2019-10-15 NOTE — Progress Notes (Signed)
Occupational Therapy Treatment Patient Details Name: Patricia Wall MRN: [id number] DOB: 12/30/1950 Today's Date: 10/15/2019    History of present illness 69 y.o. female with a known history of hypertension, diastolic heart failure, coronary artery disease, breast cancer status post lumpectomy and radiation treatment, type 2 diabetes, chronic respiratory failure with hypoxia requiring 4 L oxygen via nasal cannula with COPD is being admitted for acute on chronic hypoxic respiratory failure due to COPD exacerbation.   OT comments  Patient reports she is going home today and is excited. Patient supervision level for bed mobility, mod A for LB dressing and supervision  x2 sit to stands from edge of bed. Patient request to sit EOB at end of session, bed alarm on and call light in reach.   Follow Up Recommendations  Supervision/Assistance - 24 hour    Equipment Recommendations  None recommended by OT       Precautions / Restrictions Precautions Precautions: Fall Precaution Comments: 4L O2 [place] baseline       Mobility Bed Mobility Overal bed mobility: Modified Independent             General bed mobility comments: HOB elevated   Transfers Overall transfer level: Needs assistance Equipment used: None Transfers: Sit to/from Stand Sit to Stand: Supervision         General transfer comment: patient stood from EOB x2 without physical assistance, supervision for safety    Balance Overall balance assessment: Mild deficits observed, not formally tested                                         ADL either performed or assessed with clinical judgement   ADL Overall ADL's : Needs assistance/impaired                     Lower Body Dressing: Moderate assistance;Sitting/lateral leans;Sit to/from stand Lower Body Dressing Details (indicate cue type and reason): patient require assist to initiate threading pants, able to pull up in standing. reports DTR assists  with this at home as well Toilet Transfer: Radiographer, therapeutic Details (indicate cue type and reason): simulated with functional mobility, no physical assistance to stand          Functional mobility during ADLs: Supervision/safety General ADL Comments: patient excited she is going home today               Cognition Arousal/Alertness: Awake/alert Behavior During Therapy: WFL for tasks assessed/performed Overall Cognitive Status: Within Functional Limits for tasks assessed                                                     Pertinent Vitals/ Pain       Pain Assessment: No/denies pain         Frequency  Min 2X/week        Progress Toward Goals  OT Goals(current goals can now be found in the care plan section)  Progress towards OT goals: Progressing toward goals  Acute Rehab OT Goals Patient Stated Goal: get stonger OT Goal Formulation: With patient Time For Goal Achievement: 10/18/19 Potential to Achieve Goals: Good ADL Goals Pt Will Perform Grooming: with supervision;sitting Pt Will Perform Lower Body Dressing: with supervision;sit to/from stand Pt  Will Transfer to Toilet: with supervision;stand pivot transfer Pt Will Perform Toileting - Clothing Manipulation and hygiene: with supervision;sit to/from stand  Plan Discharge plan remains appropriate       AM-PAC OT "6 Clicks" Daily Activity     Outcome Measure   Help from another person eating meals?: A Little Help from another person taking care of personal grooming?: A Little Help from another person toileting, which includes using toliet, bedpan, or urinal?: A Little Help from another person bathing (including washing, rinsing, drying)?: A Lot Help from another person to put on and taking off regular upper body clothing?: A Lot Help from another person to put on and taking off regular lower body clothing?: A Little 6 Click Score: 16    End of Session Equipment  Utilized During Treatment: Oxygen  OT Visit Diagnosis: Unsteadiness on feet (R26.81);Other abnormalities of gait and mobility (R26.89);Repeated falls (R29.6);Muscle weakness (generalized) (M62.81)   Activity Tolerance Patient tolerated treatment well   Patient Left in bed;with call bell/phone within reach;with bed alarm set   Nurse Communication Mobility status        Time: 7106-2694 OT Time Calculation (min): 13 min  Charges: OT General Charges $OT Visit: 1 Visit OT Treatments $Self Care/Home Management : 8-22 mins  Marlyce Huge OT Pager: [phone]   Carmelia Roller 10/15/2019, 1:22 PM

## 2019-10-17 LAB — CULTURE, BLOOD (ROUTINE X 2)
Culture: NO GROWTH
Culture: NO GROWTH
Special Requests: ADEQUATE
Special Requests: ADEQUATE

## 2019-10-21 ENCOUNTER — Emergency Department (HOSPITAL_COMMUNITY): Payer: Medicare Other

## 2019-10-21 ENCOUNTER — Other Ambulatory Visit: Payer: Self-pay

## 2019-10-21 ENCOUNTER — Emergency Department (HOSPITAL_COMMUNITY)
Admission: EM | Admit: 2019-10-21 | Discharge: 2019-10-22 | Disposition: A | Discharge status: Home / Self Care | Payer: Medicare Other | Attending: Emergency Medicine | Admitting: Emergency Medicine

## 2019-10-21 ENCOUNTER — Encounter (HOSPITAL_COMMUNITY): Payer: Self-pay | Admitting: Emergency Medicine

## 2019-10-21 DIAGNOSIS — R110 Nausea: Secondary | ICD-10-CM | POA: Insufficient documentation

## 2019-10-21 DIAGNOSIS — R739 Hyperglycemia, unspecified: Secondary | ICD-10-CM | POA: Diagnosis not present

## 2019-10-21 DIAGNOSIS — R0602 Shortness of breath: Secondary | ICD-10-CM | POA: Diagnosis not present

## 2019-10-21 DIAGNOSIS — Z5321 Procedure and treatment not carried out due to patient leaving prior to being seen by health care provider: Secondary | ICD-10-CM | POA: Diagnosis not present

## 2019-10-21 DIAGNOSIS — R0789 Other chest pain: Secondary | ICD-10-CM | POA: Diagnosis present

## 2019-10-21 LAB — CBC
HCT: 32.6 % — ABNORMAL LOW (ref 36.0–46.0)
Hemoglobin: 9.8 g/dL — ABNORMAL LOW (ref 12.0–15.0)
MCH: 28.2 pg (ref 26.0–34.0)
MCHC: 30.1 g/dL (ref 30.0–36.0)
MCV: 93.9 fL (ref 80.0–100.0)
Platelets: 213 10*3/uL (ref 150–400)
RBC: 3.47 MIL/uL — ABNORMAL LOW (ref 3.87–5.11)
RDW: 17.2 % — ABNORMAL HIGH (ref 11.5–15.5)
WBC: 17.9 10*3/uL — ABNORMAL HIGH (ref 4.0–10.5)
nRBC: 0 % (ref 0.0–0.2)

## 2019-10-21 LAB — BASIC METABOLIC PANEL
Anion gap: 10 (ref 5–15)
BUN: 19 mg/dL (ref 8–23)
CO2: 32 mmol/L (ref 22–32)
Calcium: 9 mg/dL (ref 8.9–10.3)
Chloride: 90 mmol/L — ABNORMAL LOW (ref 98–111)
Creatinine, Ser: 1.08 mg/dL — ABNORMAL HIGH (ref 0.44–1.00)
GFR calc Af Amer: 60 mL/min — ABNORMAL LOW (ref >60)
GFR calc non Af Amer: 52 mL/min — ABNORMAL LOW (ref >60)
Glucose, Bld: 443 mg/dL — ABNORMAL HIGH (ref 70–99)
Potassium: 4.4 mmol/L (ref 3.5–5.1)
Sodium: 132 mmol/L — ABNORMAL LOW (ref 135–145)

## 2019-10-21 LAB — CBG MONITORING, ED: Glucose-Capillary: 441 mg/dL — ABNORMAL HIGH (ref 70–99)

## 2019-10-21 LAB — TROPONIN I (HIGH SENSITIVITY): Troponin I (High Sensitivity): 13 ng/L (ref <18)

## 2019-10-21 LAB — PROTIME-INR
INR: 1 (ref 0.8–1.2)
Prothrombin Time: 12.9 seconds (ref 11.4–15.2)

## 2019-10-21 MED ORDER — SODIUM CHLORIDE 0.9% FLUSH: 3.0000 mL | Freq: Once | INTRAVENOUS | Status: DC

## 2019-10-21 NOTE — ED Triage Notes (Signed)
Patient arrived with EMS from home reports central chest pressure this evening with mild SOB and nausea , denies emesis or diaphoresis , elevated CBG="High" by EMS , denies fever or chills .

## 2019-10-22 DIAGNOSIS — R0789 Other chest pain: Secondary | ICD-10-CM | POA: Diagnosis not present

## 2019-10-22 LAB — TROPONIN I (HIGH SENSITIVITY): Troponin I (High Sensitivity): 16 ng/L (ref <18)

## 2019-10-22 NOTE — ED Notes (Signed)
Pt was told the risk of leaving pt daughter said she will take her to family doctor tomorrow.

## 2019-11-01 NOTE — Progress Notes (Deleted)
CARDIOLOGY OFFICE NOTE  Date:  11/01/2019    Patricia RedheadJoan B Wall Date of Birth: 1950-06-27 Medical Record #[id number]  PCP:  Tracey HarriesBouska, David, MD  Cardiologist:  Townsend RogerGerhardt & Zaylyn Bergdoll   No chief complaint on file.   History of Present Illness: Patricia Wall is a 69 y.o. female who presents today for a follow up  .   She has a history of known CAD withmultiple stenting, PVD andprogressiveCOPD on home supplemental O2, 4Lwith ongoing smoking use.   She had stenting to LAD, distal LCx and OM2 in 2005, in-stent restenosis with balloon in 2006, cath with patent stents in 2011. Had recurrent chest pain and underwent re-stenting to LCx in 2015. PVD hx includes 60-79%LICA stenosis that has not been checked since 2016.Echocardiogramfrom 09/22/19 EF 60-65% estimated PA 39 mmHg no significant valve disease   Last cath 04/22/19 with new stent to mid LAD Deferred colonoscopy due to DAT She has had admissions for anemia requiring transfusion Fall risk with poor balance Social situation challenging as husband has had stroke one daughter in [place] takes care of her disabled husband and another daughter has leukemia  Living with daughter Patricia Wall continues to smoke She is DNI and palliative care has seen.   ***    Past Medical History:  Diagnosis Date  . Abnormal chest xray   . ABUSE, ALCOHOL, UNSPECIFIED 09/04/2006   Qualifier: Diagnosis of  By: Delrae AlfredMulberry MD, Lanora ManisElizabeth    . Acute diastolic CHF (congestive heart failure) (HCC)   . Acute on chronic respiratory failure with hypercapnia (HCC)   . Acute respiratory failure with hypoxia (HCC) 01/07/2015  . Alcohol abuse   . Angina, class III (HCC) 03/29/2007   Qualifier: Diagnosis of  By: Delrae AlfredMulberry MD, Lanora ManisElizabeth    . ARF (acute renal failure) (HCC) 04/27/2019  . Back pain   . CAD (coronary artery disease)     [facility]  Stent to OM and LAD Last cath 6/11 cathed on June 13, [name]. 2011.  The cardiac catheterization showed 30% and 50% lesions  in the mid  and distal LAD, 50% in the OM1, 40% in the OM2 with 20% in-stent  restenosis, 40% in-stent restenosis in the circumflex and no significant  disease in the RCA.  Her EF was normal.  Dr. Juanda ChanceBrodie recommended a trial  of proton pump inhibitors   . CAD S/P percutaneous coronary angioplasty: Prior Cypher DES to pLAD, pOM2 & AVG Cx (after Om2); New DES to AVG Cx ISR & Angiosculpt PTCA of Ostial AVG Cx from OM2. 09/04/2006   Qualifier: Diagnosis of  By: Delrae AlfredMulberry MD, Lanora ManisElizabeth    . Chest pain   . COPD (chronic obstructive pulmonary disease) (HCC)   . COPD GOLD II / still smoking  09/04/2006   Followed in Pulmonary clinic/ [facility]/ [place]  Active smoker  - pft's 12/12/12 FEV1  1.41 (53%) ratio 54 with no significant reversibility and DLCO 47% corrects to 50 01/16/2013  Walked RA x 2 laps @ 185 ft each stopped due to sob, no desat     - Trial of anoro 01/16/2013 > helped but insurance would not pay - incruse one daily started 10/02/2013 >>>not covered  -Spiriva restarted 10/15/13 >  . Cyanosis   . DDD (degenerative disc disease)   . DEGENERATIVE DISC DISEASE, CERVICAL SPINE 09/04/2006   Annotation: c6-7 on the right Qualifier: Diagnosis of  By: Delrae AlfredMulberry MD, Lanora ManisElizabeth    . Depression   . Diarrhea   .  Diverticular disease   . DIVERTICULOSIS, COLON 09/04/2006   Qualifier: Diagnosis of  By: Delrae Alfred MD, Lanora Manis    . GERD (gastroesophageal reflux disease)   . H/O: hysterectomy   . Hemorrhoids   . HTN (hypertension)   . IBS (irritable bowel syndrome)   . Insomnia   . Irritable bowel syndrome 09/04/2006   Qualifier: Diagnosis of  By: Delrae Alfred MD, Lanora Manis    . Lymphadenitis   . PEPTIC ULCER DISEASE 09/04/2006   Annotation: distant history of  Qualifier: Diagnosis of  By: Delrae Alfred MD, Lanora Manis    . Pharyngitis   . PHARYNGITIS 07/18/2007   Qualifier: Diagnosis of  By: Barbaraann Barthel MD, [place]    . PUD (peptic ulcer disease)   . PVD (peripheral vascular disease) (HCC)   . Sinusitis   . TIA  (transient ischemic attack)   . Tobacco abuse   . Vertigo     Past Surgical History:  Procedure Laterality Date  . ABDOMINAL HYSTERECTOMY    . APPENDECTOMY    . BREAST LUMPECTOMY Left 2015  . CARDIAC CATHETERIZATION  2005, 2006, 2009,2011  . CHOLECYSTECTOMY    . CORONARY STENT INTERVENTION N/A 04/22/2019   Procedure: CORONARY STENT INTERVENTION;  Surgeon: Lennette Bihari, MD;  Location: [facility] INVASIVE CV LAB;  Service: Cardiovascular;  Laterality: N/A;  . CORONARY STENT PLACEMENT     Status post balloon angiogram plasty and Cypher stent to the distal    circumflex and OM2 branch  . ESOPHAGOGASTRODUODENOSCOPY (EGD) WITH PROPOFOL Left 04/21/2019   Procedure: ESOPHAGOGASTRODUODENOSCOPY (EGD) WITH PROPOFOL;  Surgeon: Willis Modena, MD;  Location: [facility] ENDOSCOPY;  Service: Endoscopy;  Laterality: Left;  . Hammertoe repair    . Left Bunionectomy    . LEFT HEART CATH AND CORONARY ANGIOGRAPHY N/A 04/22/2019   Procedure: LEFT HEART CATH AND CORONARY ANGIOGRAPHY;  Surgeon: Lennette Bihari, MD;  Location: [facility] INVASIVE CV LAB;  Service: Cardiovascular;  Laterality: N/A;  . LEFT HEART CATHETERIZATION WITH CORONARY ANGIOGRAM N/A 12/03/2013   Procedure: LEFT HEART CATHETERIZATION WITH CORONARY ANGIOGRAM;  Surgeon: Marykay Lex, MD;  Location[facility] C[facility] LAB;  Service: Cardiovascular;  Laterality: N/A;  . TUBAL LIGATION       Medications: No outpatient medications have been marked as taking for the 11/07/19 encounter (Appointment) with Wendall Stade, MD.     Allergies: No Known Allergies  Social History: The patient  reports that she has quit smoking. Her smoking use included cigarettes. She has a 14.75 pack-year smoking history. She has never used smokeless tobacco. She reports current drug use. She reports that she does not drink alcohol.   Family History: The patient's family history includes Asthma in her sister; Cancer - Other in her father and mother; Coronary artery disease in an other family  member; Emphysema in her father; Heart disease in her father and mother; Rheumatologic disease in her mother; Stroke in her sister.   Review of Systems: Please see the history of present illness.   All other systems are reviewed and negative.   Physical Exam: VS:  There were no vitals taken for this visit. [name]  BMI There is no height or weight on file to calculate BMI.  Wt Readings from Last 3 Encounters:  10/21/19 176 lb 5.9 oz (80 kg)  10/10/19 162 lb 0.6 oz (73.5 kg)  10/09/19 163 lb 2.3 oz (74 kg)    Affect appropriate Chronically ill black female  HEENT: normal Neck supple with no adenopathy JVP normal no bruits no thyromegaly Lungs  clear with no wheezing and good diaphragmatic motion Heart:  S1/S2 no murmur, no rub, gallop or click PMI normal Abdomen: benighn, BS positve, no tenderness, no AAA no bruit.  No HSM or HJR Distal pulses intact with no bruits No edema Neuro non-focal Skin warm and dry No muscular weakness    LABORATORY DATA:  EKG:  EKG is not ordered today.   Lab Results  Component Value Date   WBC 17.9 (H) 10/21/2019   HGB 9.8 (L) 10/21/2019   HCT 32.6 (L) 10/21/2019   PLT 213 10/21/2019   GLUCOSE 443 (H) 10/21/2019   CHOL 154 04/23/2019   TRIG 89 04/23/2019   HDL 51 04/23/2019   LDLCALC 85 04/23/2019   ALT 20 10/10/2019   AST 17 10/10/2019   NA 132 (L) 10/21/2019   K 4.4 10/21/2019   CL 90 (L) 10/21/2019   CREATININE 1.08 (H) 10/21/2019   BUN 19 10/21/2019   CO2 32 10/21/2019   TSH 2.564 04/26/2019   INR 1.0 10/21/2019   HGBA1C 7.4 (H) 09/20/2019     BNP (last 3 results) Recent Labs    06/16/19 1321 09/04/19 0935 09/20/19 0753  BNP 101.4* 41.2 59.9    ProBNP (last 3 results) Recent Labs    04/15/19 1225  PROBNP 711*     Other Studies Reviewed Today:  Echo 04/19/19  1. Left ventricular ejection fraction, by visual estimation, is 60 to 65%. The left ventricle has normal function. There is mildly increased left  ventricular hypertrophy. 2. Left ventricular diastolic parameters are consistent with Grade I diastolic dysfunction (impaired relaxation). 3. The left ventricle has no regional wall motion abnormalities. 4. Global right ventricle has normal systolic function.The right ventricular size is normal. No increase in right ventricular wall thickness. 5. Left atrial size was normal. 6. Right atrial size was normal. 7. The mitral valve is normal in structure. No evidence of mitral valve regurgitation. 8. The tricuspid valve is grossly normal. 9. The tricuspid valve is grossly normal. Tricuspid valve regurgitation is trivial. 10. The aortic valve is normal in structure. Aortic valve regurgitation is not visualized. 11. The pulmonic valve was normal in structure. Pulmonic valve regurgitation is not visualized. 12. Moderately elevated pulmonary artery systolic pressure. 13. Pulmonary hypertension is moderate. 14. The atrial septum is grossly normal.  CARDIAC CATH: 04/22/2019  2nd Mrg lesion is 20% stenosed.  Previously placed Mid Cx to Dist Cx stent (unknown type) is widely patent.  Prox LAD to Mid LAD lesion is 90% stenosed.  Post intervention, there is a 0% residual stenosis.  A stent was successfully placed.  There is 90% eccentric in-stent restenosis in the distal proximal LAD Cypher stent which was placed in 2005 in a large LAD system.  Patent stents in a dominant left circumflex vessel with 20% intimal hyperplasia in the stent involving the OM1 vessel and patent stent in the AV groove circumflex.  Nondominant normal RCA.  LVEDP 2 mm Hg.  Successful percutaneous coronary intervention to the previously placed 3.0 x 23 mm Cypher stent in the proximal LAD with 90% in-stent restenosis in the distal aspect of the stent with mild narrowing just beyond the stent treated with multiple cuts with a Wolverine cutting balloon 2.5 x 10 mm, and ultimate stenting with a 3.0 x 18 mm Resolute  Onyx stent postdilated to 3.25 mm with the stenosis being reduced to 0%.  RECOMMENDATION: DAPT therapy initially with aspirin/Plavix. If anticoagulation is to be started per primary team due to recent  PAF, aspirin can be discontinued after 4 weeks of therapy.  ASSESSMENT AND PLAN:  1.CAD - history of multiple PCIs - last in February 2021  - remains on Plavix and Eliqius.   2. Prior AF with RVR - also with prior atrial tach noted - rate is fair. On anticoagulation.   3. Continue weight loss - still high risk for colonoscopy.   4. Anemia - chronic disease - HGB 9.8   5. Chronic respiratory failure/hypoxia - continued smoking - on oxygen.  Overall prognosis tenuous. She is DNI and on palliative care - not wanting Hospice services at this time.   6. COPD with severe emphysema/chronic dyspnea - on oxygen.   7. HTN - BP is low observe   8. Chronic diastolic HF - weight continues to go down - no swelling. Breathing is felt to be more related to her severe COPD.   9. Carotid disease - given her situation - no further imaging felt to be needed.   10. HLD - on statin.    13. COVID-19 Education: The signs and symptoms of COVID-19 were discussed with the patient and how to seek care for testing (follow up with PCP or arrange E-visit).  The importance of social distancing, staying at home, hand hygiene and wearing a mask when out in public were discussed today. She has been vaccinated.   Current medicines are reviewed with the patient today.  The patient does not have concerns regarding medicines other than what has been noted above.  The following changes have been made:  See above.  Labs/ tests ordered today include:   No orders of the defined types were placed in this encounter.    Disposition:   FU with cardiology 6 months    Patient is agreeable to this plan and will call if any problems develop in the interim.   Signed: Charlton Haws, MD  11/01/2019 8:36 AM  [facility] [address] [place], [place]  [address] Phone: [phone] Fax: [phone]

## 2019-11-05 ENCOUNTER — Telehealth: Payer: Self-pay | Admitting: Cardiovascular Disease

## 2019-11-05 NOTE — Telephone Encounter (Signed)
Spoke with the patient's daughter and advised that the patient could discontinue the Plavix and continue back on ASA 81 mg daily. She verbalized understanding and will d/c Plavix starting tomorrow.

## 2019-11-05 NOTE — Telephone Encounter (Signed)
Per last office visit with Norma Fredrickson, NP on 5/25 patient was to follow up with Dr Eden Emms in August and discuss possibly stopping Plavix. I spoke with patient's daughter who reports patient is now being followed by home hospice for end stage COPD.  Insurance will only cover visits with patient's hospice attending--Dr Everlene Other.  Daughter is calling to update Dr Eden Emms and ask about Plavix.

## 2019-11-05 NOTE — Telephone Encounter (Signed)
Ok to stop plavix and continue 81 mg ASA

## 2019-11-05 NOTE — Telephone Encounter (Signed)
Pt c/o medication issue:  1. Name of Medication: clopidogrel (PLAVIX) 75 MG tablet  2. How are you currently taking this medication (dosage and times per day)? 1 tablet a day  3. Are you having a reaction (difficulty breathing--STAT)? no  4. What is your medication issue? Patient's daughter states the patient is in hospice and her insurance will not pay for her appointments to see Dr. Eden Emms. She states she was supposed to see him about stopping her plavix and does not know what to do. Please advise,

## 2019-11-07 ENCOUNTER — Ambulatory Visit: Payer: Medicare Other | Admitting: Cardiovascular Disease

## 2019-11-26 ENCOUNTER — Other Ambulatory Visit: Payer: Self-pay | Admitting: Cardiovascular Disease

## 2019-11-26 NOTE — Telephone Encounter (Signed)
Prescription refill request for Eliquis received.  Last office visit: Patricia Wall 08/13/2019 Scr: 1.08, 10/21/2019 Age: 69 y.o. Weight: 80 kg   Prescription refill sent.

## 2019-12-08 ENCOUNTER — Other Ambulatory Visit (HOSPITAL_COMMUNITY): Payer: Self-pay | Admitting: Oncology

## 2020-01-22 ENCOUNTER — Ambulatory Visit: Payer: Medicare Other | Admitting: Internal Medicine

## 2020-05-08 ENCOUNTER — Other Ambulatory Visit: Payer: Self-pay | Admitting: Cardiovascular Disease

## 2020-05-08 NOTE — Telephone Encounter (Signed)
Eliquis 5mg  refill request received. Patient is 70 years old, weight-80kg, Crea-1.08 on 10/21/2019, Diagnosis-Afib, and last seen by 12/21/2019 on 08/13/2019. Dose is appropriate based on dosing criteria. Will send in refill to requested pharmacy.
